# Patient Record
Sex: Female | Born: 1945 | Race: White | Hispanic: No | Marital: Married | Smoking: Never smoker
Health system: Southern US, Community
[De-identification: ages and names within clinical notes are randomized; demographics above are authoritative.]

## PROBLEM LIST (undated history)

## (undated) DIAGNOSIS — R911 Solitary pulmonary nodule: Secondary | ICD-10-CM

## (undated) DIAGNOSIS — IMO0001 Reserved for inherently not codable concepts without codable children: Secondary | ICD-10-CM

## (undated) DIAGNOSIS — E785 Hyperlipidemia, unspecified: Secondary | ICD-10-CM

## (undated) DIAGNOSIS — R519 Headache, unspecified: Secondary | ICD-10-CM

## (undated) DIAGNOSIS — E041 Nontoxic single thyroid nodule: Secondary | ICD-10-CM

## (undated) DIAGNOSIS — K219 Gastro-esophageal reflux disease without esophagitis: Secondary | ICD-10-CM

## (undated) DIAGNOSIS — K922 Gastrointestinal hemorrhage, unspecified: Secondary | ICD-10-CM

## (undated) DIAGNOSIS — D1720 Benign lipomatous neoplasm of skin and subcutaneous tissue of unspecified limb: Secondary | ICD-10-CM

## (undated) DIAGNOSIS — C3491 Malignant neoplasm of unspecified part of right bronchus or lung: Secondary | ICD-10-CM

## (undated) DIAGNOSIS — Z9889 Other specified postprocedural states: Secondary | ICD-10-CM

## (undated) DIAGNOSIS — R1030 Lower abdominal pain, unspecified: Secondary | ICD-10-CM

## (undated) DIAGNOSIS — C73 Malignant neoplasm of thyroid gland: Secondary | ICD-10-CM

## (undated) DIAGNOSIS — R112 Nausea with vomiting, unspecified: Secondary | ICD-10-CM

## (undated) HISTORY — PX: CATARACT EXTRACTION, BILATERAL: SHX1313

## (undated) HISTORY — PX: EYE SURGERY: SHX253

## (undated) HISTORY — PX: ESOPHAGOGASTRODUODENOSCOPY: SHX1529

## (undated) HISTORY — PX: BREAST SURGERY: SHX581

## (undated) HISTORY — DX: Lower abdominal pain, unspecified: R10.30

## (undated) HISTORY — DX: Benign lipomatous neoplasm of skin and subcutaneous tissue of unspecified limb: D17.20

## (undated) HISTORY — DX: Hyperlipidemia, unspecified: E78.5

## (undated) HISTORY — DX: Reserved for inherently not codable concepts without codable children: IMO0001

## (undated) HISTORY — DX: Gastro-esophageal reflux disease without esophagitis: K21.9

## (undated) HISTORY — DX: Gastrointestinal hemorrhage, unspecified: K92.2

## (undated) HISTORY — PX: OOPHORECTOMY: SHX86

## (undated) HISTORY — PX: ABDOMINAL HYSTERECTOMY: SHX81

---

## 1974-10-31 HISTORY — PX: HERNIA REPAIR: SHX51

## 1975-11-01 HISTORY — PX: BREAST EXCISIONAL BIOPSY: SUR124

## 1975-11-01 HISTORY — PX: BREAST MASS EXCISION: SHX1267

## 1976-10-31 HISTORY — PX: CARPAL TUNNEL RELEASE: SHX101

## 1999-03-25 ENCOUNTER — Other Ambulatory Visit: Admission: RE | Admit: 1999-03-25 | Discharge: 1999-03-25 | Payer: Self-pay | Admitting: *Deleted

## 1999-07-20 ENCOUNTER — Other Ambulatory Visit: Admission: RE | Admit: 1999-07-20 | Discharge: 1999-07-20 | Payer: Self-pay | Admitting: *Deleted

## 1999-11-01 HISTORY — PX: DILATION AND CURETTAGE OF UTERUS: SHX78

## 1999-12-17 ENCOUNTER — Encounter (INDEPENDENT_AMBULATORY_CARE_PROVIDER_SITE_OTHER): Payer: Self-pay

## 1999-12-17 ENCOUNTER — Other Ambulatory Visit: Admission: RE | Admit: 1999-12-17 | Discharge: 1999-12-17 | Payer: Self-pay | Admitting: *Deleted

## 2000-01-06 ENCOUNTER — Encounter: Payer: Self-pay | Admitting: *Deleted

## 2000-01-06 ENCOUNTER — Encounter: Admission: RE | Admit: 2000-01-06 | Discharge: 2000-01-06 | Payer: Self-pay | Admitting: *Deleted

## 2000-06-15 ENCOUNTER — Encounter: Payer: Self-pay | Admitting: *Deleted

## 2000-06-15 ENCOUNTER — Encounter: Admission: RE | Admit: 2000-06-15 | Discharge: 2000-06-15 | Payer: Self-pay | Admitting: *Deleted

## 2000-08-28 ENCOUNTER — Encounter: Payer: Self-pay | Admitting: Family Medicine

## 2000-08-28 ENCOUNTER — Ambulatory Visit (HOSPITAL_COMMUNITY): Admission: RE | Admit: 2000-08-28 | Discharge: 2000-08-28 | Payer: Self-pay | Admitting: Family Medicine

## 2000-09-19 ENCOUNTER — Other Ambulatory Visit: Admission: RE | Admit: 2000-09-19 | Discharge: 2000-09-19 | Payer: Self-pay | Admitting: Family Medicine

## 2001-05-12 ENCOUNTER — Encounter: Payer: Self-pay | Admitting: Emergency Medicine

## 2001-05-12 ENCOUNTER — Observation Stay (HOSPITAL_COMMUNITY): Admission: EM | Admit: 2001-05-12 | Discharge: 2001-05-13 | Payer: Self-pay | Admitting: Emergency Medicine

## 2001-06-25 ENCOUNTER — Encounter: Admission: RE | Admit: 2001-06-25 | Discharge: 2001-06-25 | Payer: Self-pay | Admitting: Family Medicine

## 2001-06-25 ENCOUNTER — Encounter: Payer: Self-pay | Admitting: Family Medicine

## 2001-10-03 ENCOUNTER — Other Ambulatory Visit: Admission: RE | Admit: 2001-10-03 | Discharge: 2001-10-03 | Payer: Self-pay | Admitting: Family Medicine

## 2001-10-10 ENCOUNTER — Encounter: Admission: RE | Admit: 2001-10-10 | Discharge: 2001-10-10 | Payer: Self-pay | Admitting: Family Medicine

## 2001-10-10 ENCOUNTER — Encounter: Payer: Self-pay | Admitting: Family Medicine

## 2002-06-26 ENCOUNTER — Encounter: Admission: RE | Admit: 2002-06-26 | Discharge: 2002-06-26 | Payer: Self-pay | Admitting: Family Medicine

## 2002-06-26 ENCOUNTER — Encounter: Payer: Self-pay | Admitting: Family Medicine

## 2002-11-28 ENCOUNTER — Other Ambulatory Visit: Admission: RE | Admit: 2002-11-28 | Discharge: 2002-11-28 | Payer: Self-pay | Admitting: Family Medicine

## 2003-07-14 ENCOUNTER — Encounter: Payer: Self-pay | Admitting: *Deleted

## 2003-07-14 ENCOUNTER — Encounter: Admission: RE | Admit: 2003-07-14 | Discharge: 2003-07-14 | Payer: Self-pay | Admitting: *Deleted

## 2003-12-03 ENCOUNTER — Other Ambulatory Visit: Admission: RE | Admit: 2003-12-03 | Discharge: 2003-12-03 | Payer: Self-pay | Admitting: Family Medicine

## 2004-07-27 ENCOUNTER — Encounter: Admission: RE | Admit: 2004-07-27 | Discharge: 2004-07-27 | Payer: Self-pay | Admitting: Family Medicine

## 2004-11-29 ENCOUNTER — Ambulatory Visit: Payer: Self-pay | Admitting: Family Medicine

## 2004-12-01 ENCOUNTER — Ambulatory Visit: Payer: Self-pay | Admitting: Family Medicine

## 2004-12-01 ENCOUNTER — Other Ambulatory Visit: Admission: RE | Admit: 2004-12-01 | Discharge: 2004-12-01 | Payer: Self-pay | Admitting: Family Medicine

## 2004-12-15 ENCOUNTER — Ambulatory Visit: Payer: Self-pay | Admitting: Family Medicine

## 2005-01-10 ENCOUNTER — Ambulatory Visit: Payer: Self-pay | Admitting: Family Medicine

## 2005-02-01 ENCOUNTER — Ambulatory Visit: Payer: Self-pay | Admitting: Family Medicine

## 2005-02-22 ENCOUNTER — Ambulatory Visit: Payer: Self-pay | Admitting: Family Medicine

## 2005-04-05 ENCOUNTER — Ambulatory Visit: Payer: Self-pay | Admitting: Family Medicine

## 2005-06-02 ENCOUNTER — Ambulatory Visit: Payer: Self-pay | Admitting: Family Medicine

## 2005-06-07 ENCOUNTER — Ambulatory Visit: Payer: Self-pay | Admitting: Family Medicine

## 2005-08-09 ENCOUNTER — Encounter: Admission: RE | Admit: 2005-08-09 | Discharge: 2005-08-09 | Payer: Self-pay | Admitting: Family Medicine

## 2005-12-22 ENCOUNTER — Ambulatory Visit: Payer: Self-pay | Admitting: Family Medicine

## 2005-12-28 ENCOUNTER — Other Ambulatory Visit: Admission: RE | Admit: 2005-12-28 | Discharge: 2005-12-28 | Payer: Self-pay | Admitting: Family Medicine

## 2005-12-28 ENCOUNTER — Ambulatory Visit: Payer: Self-pay | Admitting: Family Medicine

## 2005-12-28 ENCOUNTER — Encounter (INDEPENDENT_AMBULATORY_CARE_PROVIDER_SITE_OTHER): Payer: Self-pay | Admitting: Specialist

## 2006-01-19 ENCOUNTER — Ambulatory Visit: Payer: Self-pay | Admitting: Family Medicine

## 2006-08-16 ENCOUNTER — Encounter: Admission: RE | Admit: 2006-08-16 | Discharge: 2006-08-16 | Payer: Self-pay | Admitting: Family Medicine

## 2006-09-04 ENCOUNTER — Ambulatory Visit: Payer: Self-pay | Admitting: Family Medicine

## 2006-09-27 ENCOUNTER — Ambulatory Visit: Payer: Self-pay | Admitting: Family Medicine

## 2006-10-03 ENCOUNTER — Encounter: Admission: RE | Admit: 2006-10-03 | Discharge: 2006-10-03 | Payer: Self-pay | Admitting: Family Medicine

## 2006-10-18 ENCOUNTER — Ambulatory Visit: Payer: Self-pay | Admitting: Gastroenterology

## 2006-12-18 ENCOUNTER — Ambulatory Visit (HOSPITAL_COMMUNITY): Admission: RE | Admit: 2006-12-18 | Discharge: 2006-12-18 | Payer: Self-pay | Admitting: Gastroenterology

## 2006-12-29 ENCOUNTER — Ambulatory Visit: Payer: Self-pay | Admitting: Family Medicine

## 2006-12-29 LAB — CONVERTED CEMR LAB
BUN: 11 mg/dL
CO2: 31 meq/L
Calcium: 8.9 mg/dL
Chloride: 103 meq/L
Cholesterol: 119 mg/dL
Creatinine, Ser: 0.7 mg/dL
GFR calc Af Amer: 109 mL/min
GFR calc non Af Amer: 90 mL/min
Glucose, Bld: 101 mg/dL — ABNORMAL HIGH
HDL: 34.5 mg/dL — ABNORMAL LOW
LDL Cholesterol: 67 mg/dL
Potassium: 4.1 meq/L
Sodium: 138 meq/L
TSH: 1.89 u[IU]/mL
Total CHOL/HDL Ratio: 3.4
Triglycerides: 86 mg/dL
VLDL: 17 mg/dL

## 2007-01-03 ENCOUNTER — Other Ambulatory Visit: Admission: RE | Admit: 2007-01-03 | Discharge: 2007-01-03 | Payer: Self-pay | Admitting: Family Medicine

## 2007-01-03 ENCOUNTER — Encounter: Payer: Self-pay | Admitting: Family Medicine

## 2007-01-03 ENCOUNTER — Ambulatory Visit: Payer: Self-pay | Admitting: Family Medicine

## 2007-01-23 ENCOUNTER — Ambulatory Visit: Payer: Self-pay | Admitting: Family Medicine

## 2007-07-12 ENCOUNTER — Telehealth (INDEPENDENT_AMBULATORY_CARE_PROVIDER_SITE_OTHER): Payer: Self-pay | Admitting: *Deleted

## 2007-08-14 ENCOUNTER — Ambulatory Visit: Payer: Self-pay | Admitting: Family Medicine

## 2007-08-21 ENCOUNTER — Encounter: Admission: RE | Admit: 2007-08-21 | Discharge: 2007-08-21 | Payer: Self-pay | Admitting: Family Medicine

## 2007-08-22 ENCOUNTER — Encounter (INDEPENDENT_AMBULATORY_CARE_PROVIDER_SITE_OTHER): Payer: Self-pay | Admitting: *Deleted

## 2007-10-10 ENCOUNTER — Encounter: Payer: Self-pay | Admitting: Family Medicine

## 2007-11-01 HISTORY — PX: COLONOSCOPY: SHX174

## 2008-01-31 ENCOUNTER — Ambulatory Visit: Payer: Self-pay | Admitting: Family Medicine

## 2008-01-31 DIAGNOSIS — E749 Disorder of carbohydrate metabolism, unspecified: Secondary | ICD-10-CM | POA: Insufficient documentation

## 2008-01-31 DIAGNOSIS — E785 Hyperlipidemia, unspecified: Secondary | ICD-10-CM | POA: Insufficient documentation

## 2008-01-31 LAB — CONVERTED CEMR LAB
ALT: 30 units/L (ref 0–35)
AST: 27 units/L (ref 0–37)
Albumin: 3.7 g/dL (ref 3.5–5.2)
Alkaline Phosphatase: 43 units/L (ref 39–117)
BUN: 17 mg/dL (ref 6–23)
Bilirubin, Direct: 0.1 mg/dL (ref 0.0–0.3)
CO2: 30 meq/L (ref 19–32)
Calcium: 9.2 mg/dL (ref 8.4–10.5)
Chloride: 103 meq/L (ref 96–112)
Cholesterol: 119 mg/dL (ref 0–200)
Creatinine, Ser: 0.7 mg/dL (ref 0.4–1.2)
Creatinine,U: 55.3 mg/dL
GFR calc Af Amer: 109 mL/min
GFR calc non Af Amer: 90 mL/min
Glucose, Bld: 97 mg/dL (ref 70–99)
HDL: 37.2 mg/dL — ABNORMAL LOW (ref >39.0)
LDL Cholesterol: 65 mg/dL (ref 0–99)
Microalb Creat Ratio: 9 mg/g (ref 0.0–30.0)
Microalb, Ur: 0.5 mg/dL (ref 0.0–1.9)
Potassium: 4.2 meq/L (ref 3.5–5.1)
Sodium: 141 meq/L (ref 135–145)
TSH: 1.29 microintl units/mL (ref 0.35–5.50)
Total Bilirubin: 1 mg/dL (ref 0.3–1.2)
Total CHOL/HDL Ratio: 3.2
Total Protein: 6.6 g/dL (ref 6.0–8.3)
Triglycerides: 85 mg/dL (ref 0–149)
VLDL: 17 mg/dL (ref 0–40)

## 2008-02-06 ENCOUNTER — Encounter: Payer: Self-pay | Admitting: Family Medicine

## 2008-02-06 ENCOUNTER — Other Ambulatory Visit: Admission: RE | Admit: 2008-02-06 | Discharge: 2008-02-06 | Payer: Self-pay | Admitting: Family Medicine

## 2008-02-06 ENCOUNTER — Ambulatory Visit: Payer: Self-pay | Admitting: Family Medicine

## 2008-02-07 LAB — CONVERTED CEMR LAB: Pap Smear: NORMAL

## 2008-02-12 ENCOUNTER — Encounter (INDEPENDENT_AMBULATORY_CARE_PROVIDER_SITE_OTHER): Payer: Self-pay | Admitting: *Deleted

## 2008-04-25 ENCOUNTER — Telehealth: Payer: Self-pay | Admitting: Family Medicine

## 2008-07-28 ENCOUNTER — Telehealth: Payer: Self-pay | Admitting: Family Medicine

## 2008-08-26 ENCOUNTER — Encounter: Admission: RE | Admit: 2008-08-26 | Discharge: 2008-08-26 | Payer: Self-pay | Admitting: Family Medicine

## 2008-08-27 ENCOUNTER — Encounter (INDEPENDENT_AMBULATORY_CARE_PROVIDER_SITE_OTHER): Payer: Self-pay | Admitting: *Deleted

## 2008-12-12 ENCOUNTER — Ambulatory Visit: Payer: Self-pay | Admitting: Internal Medicine

## 2008-12-12 DIAGNOSIS — R030 Elevated blood-pressure reading, without diagnosis of hypertension: Secondary | ICD-10-CM | POA: Insufficient documentation

## 2008-12-30 ENCOUNTER — Ambulatory Visit (HOSPITAL_COMMUNITY): Admission: RE | Admit: 2008-12-30 | Discharge: 2008-12-31 | Payer: Self-pay | Admitting: Obstetrics and Gynecology

## 2008-12-30 ENCOUNTER — Encounter (INDEPENDENT_AMBULATORY_CARE_PROVIDER_SITE_OTHER): Payer: Self-pay | Admitting: Obstetrics and Gynecology

## 2008-12-30 HISTORY — PX: TOTAL VAGINAL HYSTERECTOMY: SHX2548

## 2008-12-30 HISTORY — PX: ABDOMINAL HYSTERECTOMY: SHX81

## 2009-01-08 ENCOUNTER — Ambulatory Visit: Payer: Self-pay | Admitting: Family Medicine

## 2009-02-09 ENCOUNTER — Ambulatory Visit: Payer: Self-pay | Admitting: Family Medicine

## 2009-02-09 LAB — CONVERTED CEMR LAB
ALT: 27 units/L (ref 0–35)
AST: 23 units/L (ref 0–37)
Albumin: 3.9 g/dL (ref 3.5–5.2)
Alkaline Phosphatase: 43 units/L (ref 39–117)
BUN: 16 mg/dL (ref 6–23)
Basophils Absolute: 0.1 10*3/uL (ref 0.0–0.1)
Basophils Relative: 0.8 % (ref 0.0–3.0)
Bilirubin, Direct: 0 mg/dL (ref 0.0–0.3)
CO2: 32 meq/L (ref 19–32)
Calcium: 9.4 mg/dL (ref 8.4–10.5)
Chloride: 108 meq/L (ref 96–112)
Cholesterol: 143 mg/dL (ref 0–200)
Creatinine, Ser: 0.8 mg/dL (ref 0.4–1.2)
Creatinine,U: 25.9 mg/dL
Eosinophils Absolute: 0.1 10*3/uL (ref 0.0–0.7)
Eosinophils Relative: 1.2 % (ref 0.0–5.0)
GFR calc non Af Amer: 76.58 mL/min (ref >60)
Glucose, Bld: 87 mg/dL (ref 70–99)
HCT: 40.4 % (ref 36.0–46.0)
HDL: 39.5 mg/dL (ref >39.00)
Hemoglobin: 14.2 g/dL (ref 12.0–15.0)
LDL Cholesterol: 76 mg/dL (ref 0–99)
Lymphocytes Relative: 34.5 % (ref 12.0–46.0)
Lymphs Abs: 2.3 10*3/uL (ref 0.7–4.0)
MCHC: 35.2 g/dL (ref 30.0–36.0)
MCV: 89.4 fL (ref 78.0–100.0)
Microalb Creat Ratio: 7.7 mg/g (ref 0.0–30.0)
Microalb, Ur: 0.2 mg/dL (ref 0.0–1.9)
Monocytes Absolute: 0.4 10*3/uL (ref 0.1–1.0)
Monocytes Relative: 6.5 % (ref 3.0–12.0)
Neutro Abs: 3.7 10*3/uL (ref 1.4–7.7)
Neutrophils Relative %: 57 % (ref 43.0–77.0)
Platelets: 130 10*3/uL — ABNORMAL LOW (ref 150.0–400.0)
Potassium: 4.6 meq/L (ref 3.5–5.1)
RBC: 4.52 M/uL (ref 3.87–5.11)
RDW: 13.6 % (ref 11.5–14.6)
Sodium: 143 meq/L (ref 135–145)
TSH: 2.17 microintl units/mL (ref 0.35–5.50)
Total Bilirubin: 0.9 mg/dL (ref 0.3–1.2)
Total CHOL/HDL Ratio: 4
Total Protein: 6.8 g/dL (ref 6.0–8.3)
Triglycerides: 138 mg/dL (ref 0.0–149.0)
VLDL: 27.6 mg/dL (ref 0.0–40.0)
WBC: 6.6 10*3/uL (ref 4.5–10.5)

## 2009-02-11 ENCOUNTER — Ambulatory Visit: Payer: Self-pay | Admitting: Family Medicine

## 2009-03-04 LAB — FECAL OCCULT BLOOD, GUAIAC: Fecal Occult Blood: NEGATIVE

## 2009-03-05 ENCOUNTER — Ambulatory Visit: Payer: Self-pay | Admitting: Family Medicine

## 2009-03-05 LAB — CONVERTED CEMR LAB
OCCULT 1: NEGATIVE
OCCULT 2: NEGATIVE
OCCULT 3: NEGATIVE

## 2009-03-09 ENCOUNTER — Encounter (INDEPENDENT_AMBULATORY_CARE_PROVIDER_SITE_OTHER): Payer: Self-pay | Admitting: *Deleted

## 2009-06-02 ENCOUNTER — Ambulatory Visit: Payer: Self-pay | Admitting: Family Medicine

## 2009-09-03 ENCOUNTER — Encounter: Admission: RE | Admit: 2009-09-03 | Discharge: 2009-09-03 | Payer: Self-pay | Admitting: Family Medicine

## 2009-09-03 LAB — HM MAMMOGRAPHY: HM Mammogram: NORMAL

## 2010-01-13 HISTORY — PX: FACIAL COSMETIC SURGERY: SHX629

## 2010-02-11 ENCOUNTER — Ambulatory Visit: Payer: Self-pay | Admitting: Family Medicine

## 2010-02-12 LAB — CONVERTED CEMR LAB
ALT: 35 units/L (ref 0–35)
AST: 29 units/L (ref 0–37)
Albumin: 3.9 g/dL (ref 3.5–5.2)
Alkaline Phosphatase: 49 units/L (ref 39–117)
BUN: 11 mg/dL (ref 6–23)
Basophils Absolute: 0 10*3/uL (ref 0.0–0.1)
Basophils Relative: 0.6 % (ref 0.0–3.0)
Bilirubin, Direct: 0.1 mg/dL (ref 0.0–0.3)
CO2: 31 meq/L (ref 19–32)
Calcium: 9.2 mg/dL (ref 8.4–10.5)
Chloride: 106 meq/L (ref 96–112)
Cholesterol: 126 mg/dL (ref 0–200)
Creatinine, Ser: 0.8 mg/dL (ref 0.4–1.2)
Creatinine,U: 78.2 mg/dL
Eosinophils Absolute: 0.1 10*3/uL (ref 0.0–0.7)
Eosinophils Relative: 1.4 % (ref 0.0–5.0)
GFR calc non Af Amer: 76.34 mL/min (ref >60)
Glucose, Bld: 96 mg/dL (ref 70–99)
HCT: 41.3 % (ref 36.0–46.0)
HDL: 41.2 mg/dL (ref >39.00)
Hemoglobin: 14.3 g/dL (ref 12.0–15.0)
LDL Cholesterol: 68 mg/dL (ref 0–99)
Lymphocytes Relative: 32.3 % (ref 12.0–46.0)
Lymphs Abs: 1.9 10*3/uL (ref 0.7–4.0)
MCHC: 34.5 g/dL (ref 30.0–36.0)
MCV: 90.8 fL (ref 78.0–100.0)
Microalb Creat Ratio: 11.5 mg/g (ref 0.0–30.0)
Microalb, Ur: 0.9 mg/dL (ref 0.0–1.9)
Monocytes Absolute: 0.4 10*3/uL (ref 0.1–1.0)
Monocytes Relative: 7.4 % (ref 3.0–12.0)
Neutro Abs: 3.4 10*3/uL (ref 1.4–7.7)
Neutrophils Relative %: 58.3 % (ref 43.0–77.0)
Platelets: 131 10*3/uL — ABNORMAL LOW (ref 150.0–400.0)
Potassium: 4.2 meq/L (ref 3.5–5.1)
RBC: 4.55 M/uL (ref 3.87–5.11)
RDW: 15.3 % — ABNORMAL HIGH (ref 11.5–14.6)
Sodium: 143 meq/L (ref 135–145)
TSH: 1.65 microintl units/mL (ref 0.35–5.50)
Total Bilirubin: 0.7 mg/dL (ref 0.3–1.2)
Total CHOL/HDL Ratio: 3
Total Protein: 6.8 g/dL (ref 6.0–8.3)
Triglycerides: 83 mg/dL (ref 0.0–149.0)
VLDL: 16.6 mg/dL (ref 0.0–40.0)
WBC: 5.8 10*3/uL (ref 4.5–10.5)

## 2010-02-17 ENCOUNTER — Other Ambulatory Visit: Admission: RE | Admit: 2010-02-17 | Discharge: 2010-02-17 | Payer: Self-pay | Admitting: Family Medicine

## 2010-02-17 ENCOUNTER — Ambulatory Visit: Payer: Self-pay | Admitting: Family Medicine

## 2010-02-17 LAB — CONVERTED CEMR LAB: Pap Smear: NORMAL

## 2010-02-17 LAB — HM PAP SMEAR

## 2010-02-28 LAB — CONVERTED CEMR LAB: Pap Smear: NEGATIVE

## 2010-03-01 ENCOUNTER — Encounter (INDEPENDENT_AMBULATORY_CARE_PROVIDER_SITE_OTHER): Payer: Self-pay | Admitting: *Deleted

## 2010-05-05 ENCOUNTER — Encounter: Payer: Self-pay | Admitting: Family Medicine

## 2010-05-05 ENCOUNTER — Telehealth: Payer: Self-pay | Admitting: Family Medicine

## 2010-05-07 ENCOUNTER — Encounter: Payer: Self-pay | Admitting: Family Medicine

## 2010-06-03 ENCOUNTER — Encounter (INDEPENDENT_AMBULATORY_CARE_PROVIDER_SITE_OTHER): Payer: Self-pay | Admitting: *Deleted

## 2010-06-16 ENCOUNTER — Encounter (INDEPENDENT_AMBULATORY_CARE_PROVIDER_SITE_OTHER): Payer: Self-pay | Admitting: *Deleted

## 2010-07-20 ENCOUNTER — Telehealth: Payer: Self-pay | Admitting: Family Medicine

## 2010-11-12 ENCOUNTER — Other Ambulatory Visit: Payer: Self-pay | Admitting: Family Medicine

## 2010-11-12 ENCOUNTER — Ambulatory Visit
Admission: RE | Admit: 2010-11-12 | Discharge: 2010-11-12 | Payer: Self-pay | Source: Home / Self Care | Attending: Family Medicine | Admitting: Family Medicine

## 2010-11-12 LAB — HEPATIC FUNCTION PANEL
ALT: 23 U/L (ref 0–35)
AST: 24 U/L (ref 0–37)
Albumin: 3.6 g/dL (ref 3.5–5.2)
Alkaline Phosphatase: 49 U/L (ref 39–117)
Bilirubin, Direct: 0.1 mg/dL (ref 0.0–0.3)
Total Bilirubin: 0.6 mg/dL (ref 0.3–1.2)
Total Protein: 6.2 g/dL (ref 6.0–8.3)

## 2010-11-12 LAB — LIPID PANEL
Cholesterol: 160 mg/dL (ref 0–200)
HDL: 34.7 mg/dL — ABNORMAL LOW (ref >39.00)
LDL Cholesterol: 106 mg/dL — ABNORMAL HIGH (ref 0–99)
Total CHOL/HDL Ratio: 5
Triglycerides: 96 mg/dL (ref 0.0–149.0)
VLDL: 19.2 mg/dL (ref 0.0–40.0)

## 2010-11-17 ENCOUNTER — Ambulatory Visit
Admission: RE | Admit: 2010-11-17 | Discharge: 2010-11-17 | Payer: Self-pay | Source: Home / Self Care | Attending: Family Medicine | Admitting: Family Medicine

## 2010-11-17 DIAGNOSIS — I10 Essential (primary) hypertension: Secondary | ICD-10-CM | POA: Insufficient documentation

## 2010-11-21 ENCOUNTER — Encounter: Payer: Self-pay | Admitting: Gastroenterology

## 2010-12-02 NOTE — Assessment & Plan Note (Signed)
Summary: PAP SMEAR AND CPX/CLE   Vital Signs:  Patient Profile:   65 Years Old Female Height:     64 inches Weight:      127 pounds Temp:     97.1 degrees F oral Pulse rate:   72 / minute Pulse rhythm:   regular BP sitting:   140 / 70  (left arm) Cuff size:   regular  Vitals Entered By: Providence Crosby (February 06, 2008 4:00 PM)                 Chief Complaint:  check up with pap just had colonoscopy and egd.  History of Present Illness: Here for Comp Exam. saw Dr Henderson Cloud in Fall when sister had ovarian ca...he did bimanual and U/S with fibroids that were unchanged. She also ha colonoscopy for screen dur to ovarian ca in sister and had EGD for repeat from before that looked ok and doesn't need to be repeated.  Next colonoscopy in 5 years. No complaints, feels well...firbroid hurts once in a while. Another sister has cyst on ovaries.    Prior Medications Reviewed Using: Patient Recall  Current Allergies (reviewed today): ! * MEDROL DOSE PACK ! SULFA ! * TYLENOL ARTHRITIS ! CIPRO  Past Surgical History:    EGD Nml (Dr Kinnie Scales) 11/15/2007    Colonoscopy   Nml (Dr Shauna Hugh) 11/15/2007    Risk Factors:  Tobacco use:  never Passive smoke exposure:  no Drug use:  no HIV high-risk behavior:  no Caffeine use:  2 drinks per day Alcohol use:  no Exercise:  yes    Times per week:  2    Type:  treadmill  Seatbelt use:  100 %   Review of Systems  Eyes      Denies blurring, discharge, double vision, eye irritation, eye pain, halos, itching, light sensitivity, red eye, vision loss-1 eye, and vision loss-both eyes.      contacts, exam 12/08  ENT      Denies decreased hearing, difficulty swallowing, ear discharge, earache, hoarseness, nasal congestion, nosebleeds, postnasal drainage, ringing in ears, sinus pressure, and sore throat.  CV      Denies bluish discoloration of lips or nails, chest pain or discomfort, difficulty breathing at night, difficulty breathing while lying  down, fainting, fatigue, leg cramps with exertion, lightheadness, near fainting, palpitations, shortness of breath with exertion, swelling of feet, swelling of hands, and weight gain.  Resp      Denies chest discomfort, chest pain with inspiration, cough, coughing up blood, excessive snoring, hypersomnolence, morning headaches, pleuritic, shortness of breath, sputum productive, and wheezing.  GI      Complains of indigestion.      Denies abdominal pain, bloody stools, change in bowel habits, constipation, dark tarry stools, diarrhea, excessive appetite, gas, hemorrhoids, loss of appetite, nausea, vomiting, vomiting blood, and yellowish skin color.      uses Prevacid  GU      Denies abnormal vaginal bleeding, decreased libido, discharge, dysuria, genital sores, hematuria, incontinence, nocturia, urinary frequency, and urinary hesitancy.  MS      Denies joint pain, joint redness, joint swelling, loss of strength, low back pain, mid back pain, muscle aches, muscle , cramps, muscle weakness, stiffness, and thoracic pain.  Derm      Denies changes in color of skin, changes in nail beds, dryness, excessive perspiration, flushing, hair loss, insect bite(s), itching, lesion(s), poor wound healing, and rash.  Neuro      Denies brief paralysis, difficulty  with concentration, disturbances in coordination, falling down, headaches, inability to speak, memory loss, numbness, poor balance, seizures, sensation of room spinning, tingling, tremors, visual disturbances, and weakness.   Physical Exam  General:     Well-developed,well-nourished,in no acute distress; alert,appropriate and cooperative throughout examination Head:     Normocephalic and atraumatic without obvious abnormalities. No apparent alopecia or balding. Eyes:     Conjunctiva clear bilaterally.  Ears:     External ear exam shows no significant lesions or deformities.  Otoscopic examination reveals clear canals, tympanic membranes are  intact bilaterally without bulging, retraction, inflammation or discharge. Hearing is grossly normal bilaterally. Nose:     External nasal examination shows no deformity or inflammation. Nasal mucosa are pink and moist without lesions or exudates. Mouth:     Oral mucosa and oropharynx without lesions or exudates.  Teeth in good repair. Neck:     No deformities, masses, or tenderness noted. Chest Wall:     No deformities, masses, or tenderness noted. Lungs:     Normal respiratory effort, chest expands symmetrically. Lungs are clear to auscultation, no crackles or wheezes. Heart:     Normal rate and regular rhythm. S1 and S2 normal without gallop, murmur, click, rub or other extra sounds. Abdomen:     Bowel sounds positive,abdomen soft and non-tender without masses, organomegaly or hernias noted. Rectal:     No external abnormalities noted. Normal sphincter tone. No rectal masses or tenderness. G neg. Genitalia:     Pelvic Exam:        External: normal female genitalia without lesions or masses        Vagina: normal without lesions or masses        Cervix: normal without lesions or masses        Adnexa: normal bimanual exam without masses or fullness.          Uterus: normal by palpation Ovaries not felt.        Pap smear: performed Msk:     No deformity or scoliosis noted of thoracic or lumbar spine.   Pulses:     R and L carotid,radial,femoral,dorsalis pedis and posterior tibial pulses are full and equal bilaterally Extremities:     No clubbing, cyanosis, edema, or deformity noted with normal full range of motion of all joints.   Neurologic:     No cranial nerve deficits noted. Station and gait are normal. Plantar reflexes are down-going bilaterally. DTRs are symmetrical throughout. Sensory, motor and coordinative functions appear intact. Skin:     Intact without suspicious lesions or rashes Cervical Nodes:     No lymphadenopathy noted Axillary Nodes:     No palpable  lymphadenopathy Inguinal Nodes:     No significant adenopathy Psych:     Cognition and judgment appear intact. Alert and cooperative with normal attention span and concentration. No apparent delusions, illusions, hallucinations    Impression & Recommendations:  Problem # 1:  HEALTH MAINTENANCE EXAM (ICD-V70.0) Assessment: Comment Only  Problem # 2:  HYPERLIPIDEMIA (ICD-272.4) Assessment: Unchanged Great nos, try to exercise more to bring up good chol. Her updated medication list for this problem includes:    Vytorin 10-20 Mg Tabs (Ezetimibe-simvastatin) .Marland Kitchen... Take 1 tablet by mouth every night   Problem # 3:  CARBOHYDRATE METABOLISM DISORDER (ICD-271.9) Assessment: Improved Nml today. Cont watching diet.  Problem # 4:  HX, FAMILY, MALIGNANCY, OVARY (ICD-V16.41) Assessment: Unchanged Sister is responding to trmt. Pts ovaries feel nml today.  Complete Medication List: 1)  Fish Oil 1000 Mg Caps (Omega-3 fatty acids) .... Take 2 capsule by mouth twice a day 2)  Calcium 500 Mg Tabs (Calcium) .... Take 1/2 once a day 3)  Prevacid 30 Mg Cpdr (Lansoprazole) .... Take 1 capsule by mouth once a day 4)  Vytorin 10-20 Mg Tabs (Ezetimibe-simvastatin) .... Take 1 tablet by mouth every night 5)  Vision Herb  .Marland Kitchen.. 1 qd 6)  Coq 10  .Marland Kitchen.. 1 qd 7)  Vitamin C  .... Takes prn 8)  Vitamin E 400 Iu  .Marland Kitchen.. 1 qd 9)  Tylenol/codeine #3 300-30 Mg Tabs (Acetaminophen-codeine) .... One tab by mouth once daily for fibroid pain   Patient Instructions: 1)  RTC one year.    Prescriptions: TYLENOL/CODEINE #3 300-30 MG  TABS (ACETAMINOPHEN-CODEINE) one tab by mouth once daily for fibroid pain  #30 x 0   Entered and Authorized by:   Shaune Leeks MD   Signed by:   Shaune Leeks MD on 02/06/2008   Method used:   Print then Give to Patient   RxID:   [id number] VYTORIN 10-20 MG TABS (EZETIMIBE-SIMVASTATIN) Take 1 tablet by mouth every night  #30 x 11   Entered by:   Providence Crosby    Authorized by:   Shaune Leeks MD   Signed by:   Providence Crosby on 02/06/2008   Method used:   Electronically sent to ...       [facility]  [place] #[id number]       [address]       [place], [place]  [address]       Ph: [phone]       Fax: [phone]   RxID:   [id number] PREVACID 30 MG CPDR (LANSOPRAZOLE) Take 1 capsule by mouth once a day  #30 x 11   Entered by:   Providence Crosby   Authorized by:   Shaune Leeks MD   Signed by:   Providence Crosby on 02/06/2008   Method used:   Electronically sent to ...       [facility]  [id number]       [address]       [place], [place]  [address]       Ph: [phone]       Fax: [phone]   RxID:   [id number]  ]

## 2010-12-02 NOTE — Procedures (Signed)
Summary: [facility]enter[facility]/Dr. [facility]/Dr. Kinnie Scales   Imported By: Mickle Asper 10/26/2007 12:30:17  _____________________________________________________________________  External Attachment:    Type:   Image     Comment:   External Document

## 2010-12-02 NOTE — Letter (Signed)
Summary: Results Follow up Letter  [place] [facility]  [address] [place], [place] [address]   Phone: [phone]  Fax: [phone]    02/12/2008 MRN: [id number]  [facility] [address] [place], [place]  [address]  Dear Ms. Mccranie,  The following are the results of your recent test(s):  Test         Result    Pap Smear:        Normal __X___  Not Normal _____ Comments:  YOUR PAP SMEAR REPORT WAS NORMAL. PLEASE MAKE SURE TO REPEAT IN ONE YEAR. ENCLOSED IS A COPY OF YOUR REPORT. ______________________________________________________ Cholesterol: LDL(Bad cholesterol):         Your goal is less than:         HDL (Good cholesterol):       Your goal is more than: Comments:  ______________________________________________________ Mammogram:        Normal _____  Not Normal _____ Comments:  ___________________________________________________________________ Hemoccult:        Normal _____  Not normal _______ Comments:    _____________________________________________________________________ Other Tests:    We routinely do not discuss normal results over the telephone.  If you desire a copy of the results, or you have any questions about this information we can discuss them at your next office visit.   Sincerely,

## 2010-12-02 NOTE — Miscellaneous (Signed)
   Clinical Lists Changes  Observations: Added new observation of ZOSTAVAX: Zostavax (05/07/2010 15:40)      Immunization History:  Zostavax History:    Zostavax # 1:  Zostavax (05/07/2010)

## 2010-12-02 NOTE — Assessment & Plan Note (Signed)
Summary: BP CHECK / LFW   Vital Signs:  Patient profile:   65 year old female Height:      64 inches Weight:      127 pounds BMI:     21.88 Temp:     97.4 degrees F oral Pulse rate:   80 / minute Pulse rhythm:   regular BP sitting:   130 / 80  (left arm) Cuff size:   regular  Vitals Entered ByProvidence Crosby (January 08, 2009 12:20 PM)  History of Present Illness: Pt here for recheck of HBP. She had a previous episode of high BP with lightheadedness prior to being seen last time. BP was 170/80 sitting, 140/84 standing, during office visit on 12/12/2008. Family hx HTN. (every fam mbr but her) Over last month, has checked BP regularly, with average  ~125/80.  Only  value of 180/96 was taken during pre-operative exam, before her hysterectomy.  BP today was 130/80.   She had TVH on 12/30/2008 for fibroids.   Allergies: 1)  ! * Medrol Dose Pack 2)  ! Sulfa 3)  ! * Tylenol Arthritis 4)  ! Cipro  Past Surgical History:    EGD Nml (Dr Kinnie Scales) 11/15/2007    Colonoscopy   Nml (Dr Shauna Hugh) 11/15/2007    TVH for fibroid pain (Dr Huntley Dec) 12/30/2008  Physical Exam  General:  Well-developed,well-nourished,in no acute distress; alert,appropriate and cooperative throughout examination Head:  Normocephalic and atraumatic without obvious abnormalities. No apparent alopecia or balding. Eyes:  Conjunctiva clear bilaterally.  Ears:  External ear exam shows no significant lesions or deformities.  Otoscopic examination reveals clear canals, tympanic membranes are intact bilaterally without bulging, retraction, inflammation or discharge. Hearing is grossly normal bilaterally. Nose:  External nasal examination shows no deformity or inflammation. Nasal mucosa are pink and moist without lesions or exudates. Mouth:  Oral mucosa and oropharynx without lesions or exudates.  Teeth in good repair. Neck:  No deformities, masses, or tenderness noted. Lungs:  Normal respiratory effort, chest expands symmetrically.  Lungs are clear to auscultation, no crackles or wheezes. Heart:  Normal rate and regular rhythm. S1 and S2 normal without gallop, murmur, click, rub or other extra sounds.   Impression & Recommendations:  Problem # 1:  ELEVATED BLOOD PRESSURE WITHOUT DIAGNOSIS OF HYPERTENSION (ICD-796.2) Assessment Improved  Nos nml, no further sxs. Will follow. Feel she'll need medication just based on FH, but not yet.  BP today: 130/80 Prior BP: 140/74 (12/12/2008)  Labs Reviewed: Creat: 0.7 (01/31/2008) Chol: 119 (01/31/2008)   HDL: 37.2 (01/31/2008)   LDL: 65 (01/31/2008)   TG: 85 (01/31/2008)  Instructed in low sodium diet (DASH Handout) and behavior modification.    Complete Medication List: 1)  Fish Oil 1000 Mg Caps (Omega-3 fatty acids) .... Take 2 capsule by mouth twice a day 2)  Calcium 500 Mg Tabs (Calcium) .... Take 1/2 once a day 3)  Prevacid 30 Mg Cpdr (Lansoprazole) .... Take 1 capsule by mouth once a day 4)  Vytorin 10-20 Mg Tabs (Ezetimibe-simvastatin) .... Take 1 tablet by mouth every night 5)  Vision Herb  .Marland Kitchen.. 1 daily by mouth 6)  Vitamin D3 10000 Unit Caps (Cholecalciferol) .Marland Kitchen.. 1 daily by mouth 7)  Vitamin E-400 400 Unit Caps (Vitamin e) .Marland Kitchen.. 1 daily by mouth 8)  Vitamin C 500 Mg Chew (Ascorbic acid) .... As needed by mouth 9)  Coq10 50 Mg Caps (Coenzyme q10) .... Daily by mouth 10)  Percocet 5-325 Mg Tabs (Oxycodone-acetaminophen) .... As needed  post op pain

## 2010-12-02 NOTE — Letter (Signed)
Summary: Results Follow up Letter  [place] at [facility]  [address] [place], [place] [address]   Phone: [phone]  Fax: [phone]    03/01/2010 MRN: [id number]  [facility] [address] [place], [place]  [address]  Dear Ms. Muratalla,  The following are the results of your recent test(s):  Test         Result    Pap Smear:        Normal __x___  Not Normal _____ Comments: Please repeat in one year. ______________________________________________________ Cholesterol: LDL(Bad cholesterol):         Your goal is less than:         HDL (Good cholesterol):       Your goal is more than: Comments:  ______________________________________________________ Mammogram:        Normal _____  Not Normal _____ Comments:  ___________________________________________________________________ Hemoccult:        Normal _____  Not normal _______ Comments:    _____________________________________________________________________ Other Tests:    We routinely do not discuss normal results over the telephone.  If you desire a copy of the results, or you have any questions about this information we can discuss them at your next office visit.   Sincerely,     Laurita Quint, MD

## 2010-12-02 NOTE — Assessment & Plan Note (Signed)
Summary: CPX   Vital Signs:  Patient profile:   65 year old female Weight:      134 pounds Temp:     98.4 degrees F oral Pulse rate:   88 / minute Pulse rhythm:   regular BP sitting:   140 / 80  (left arm) Cuff size:   regular  Vitals Entered By: Sydell Axon LPN (February 17, 2010 2:57 PM) CC: 30 Minute checkup, hysterectomy 03/10 by Dr. Henderson Cloud and had a colonoscopy 2009 by Dr. Kinnie Scales   History of Present Illness: Pt here for Comp Exam. She feels well with no complaints. She had facial surgery in Mar. She has TVH last year for fibroids, ovaries removed due to sister's ovarian cancer.  Preventive Screening-Counseling & Management  Alcohol-Tobacco     Alcohol drinks/day: 0     Smoking Status: never     Passive Smoke Exposure: no  Caffeine-Diet-Exercise     Caffeine use/day: 1     Does Patient Exercise: no     Type of exercise: treadmill      Times/week: 2  Problems Prior to Update: 1)  Health Maintenance Exam  (ICD-V70.0) 2)  Special Screening Malig Neoplasms Other Sites  (ICD-V76.49) 3)  Elevated Blood Pressure Without Diagnosis of Hypertension  (ICD-796.2) 4)  Hyperlipidemia  (ICD-272.4) 5)  Carbohydrate Metabolism Disorder  (ICD-271.9) 6)  Hx, Family, Malignancy, Ovary  (ICD-V16.41)  Medications Prior to Update: 1)  Fish Oil 1000 Mg Caps (Omega-3 Fatty Acids) .... Take 2 Capsule By Mouth Twice A Day 2)  Calcium 500 Mg Tabs (Calcium) .... Take 1  Once A Day 3)  Prevacid 30 Mg Cpdr (Lansoprazole) .... Take 1 Capsule By Mouth Once A Day 4)  Vytorin 10-20 Mg Tabs (Ezetimibe-Simvastatin) .... Take 1 Tablet By Mouth Every Night 5)  Vision Herb .Marland Kitchen.. 1 Daily By Mouth 6)  Vitamin D3 10000 Unit Caps (Cholecalciferol) .Marland Kitchen.. 1 Daily By Mouth 7)  Vitamin E-400 400 Unit Caps (Vitamin E) .Marland Kitchen.. 1 Daily By Mouth 8)  Coq10 50 Mg Caps (Coenzyme Q10) .... Daily By Mouth 9)  Vitamin C 500 Mg .Marland KitchenMarland Kitchen. 1 Daily By Mouth 10)  Vitamin B6/vitamin B12 and Folic Acid .... Sublinqual  Daily  Allergies: 1)  ! * Medrol Dose Pack 2)  ! Sulfa 3)  ! * Tylenol Arthritis 4)  ! 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  Risk Factors: Alcohol Use: 0 (02/17/2010) Caffeine Use: 1 (02/17/2010) Exercise: no (02/17/2010)  Risk Factors: Smoking Status: never (02/17/2010) Passive Smoke Exposure: no (02/17/2010)  Past Surgical History: EGD Nml (Dr Kinnie Scales) 11/15/2007 Colonoscopy   Nml (Dr Shauna Hugh) 11/15/2007 TVH for fibroid pain (Dr Huntley Dec) 12/30/2008 Mini Facelift 01/13/2010  Family History: Father dec 91 natural causesw Mother dec 82 Mini strokes Htn Sister A 8 Htn  Sister A 68 Htn Ovarian Ca Chemo Sister A 23 Htn PAT Sister A 60 Htn  Social History: Caffeine use/day:  1 Does Patient Exercise:  no  Review of Systems General:  Denies chills, fatigue, fever, sweats, weakness, and weight loss. Eyes:  Denies blurring, discharge, and eye pain; contacts and reading glasses. ENT:  Denies decreased hearing, ear discharge, earache, and ringing in ears. CV:  Denies chest pain or discomfort,  fainting, fatigue, palpitations, shortness of breath with exertion, swelling of feet, and swelling of hands. Resp:  Denies cough, shortness of breath, and wheezing. GI:  Complains of indigestion; denies abdominal pain, bloody stools, change in bowel habits, constipation, dark tarry stools, diarrhea, loss of appetite, nausea, vomiting, vomiting blood, and yellowish skin color; takes medication. GU:  Denies decreased libido, discharge, dysuria, nocturia, and urinary frequency. MS:  Denies joint pain, low back pain, muscle aches, cramps, muscle weakness, and stiffness. Derm:  Denies dryness, itching, and rash. Neuro:  Denies  numbness, poor balance, tingling, and tremors.  Physical Exam  General:  Well-developed,well-nourished,in no acute distress; alert,appropriate and cooperative throughout examination Head:  Normocephalic and atraumatic without obvious abnormalities. No apparent alopecia or balding. Sinuses NT. Eyes:  Conjunctiva clear bilaterally.  Ears:  External ear exam shows no significant lesions or deformities.  Otoscopic examination reveals clear canals, tympanic membranes are intact bilaterally without bulging, retraction, inflammation or discharge. Hearing is grossly normal bilaterally. Nose:  External nasal examination shows no deformity or inflammation. Nasal mucosa are pink and moist without lesions or exudates. Mouth:  Oral mucosa and oropharynx without lesions or exudates.  Teeth in good repair.  Jaw nontender and no swelling noticed. Neck:  No deformities, masses, or tenderness noted. Chest Wall:  No deformities, masses, or tenderness noted. Breasts:  No mass, nodules, thickening, tenderness, bulging, retraction, inflamation, nipple discharge or skin changes noted.  Two small soft mobile masses 3mm diam felt in upper inner quadrants  of bilat breasts. Lungs:  Normal respiratory effort, chest expands symmetrically. Lungs are clear to auscultation, no crackles or wheezes. Heart:  Normal rate and regular rhythm. S1 and S2 normal without gallop, murmur, click, rub or other extra sounds. Abdomen:  Bowel sounds positive,abdomen soft and non-tender without masses, organomegaly or hernias noted. Rectal:  No external abnormalities noted. Normal sphincter tone. No rectal masses or tenderness. G neg. Genitalia:  Introitus wnl, Uterus and Cervix absent, cuff looks healthy, adnexa nontender w/o mass, ovaries not felt. Pap of cuff sent. Msk:  No deformity or scoliosis noted of thoracic or lumbar spine.   Pulses:  R and L carotid,radial,femoral,dorsalis pedis and posterior tibial pulses are full and equal  bilaterally Extremities:  No clubbing, cyanosis, edema, or deformity noted with normal full range of motion of all joints.   Neurologic:  No cranial nerve deficits noted. Station and gait are normal. Sensory, motor and coordinative functions appear intact. Skin:  Intact without suspicious lesions or rashes. Well healing facelift scars ant to ears bilat. Cervical Nodes:  No lymphadenopathy noted Axillary Nodes:  No palpable lymphadenopathy Inguinal Nodes:  No significant adenopathy Psych:  Cognition and judgment appear intact. Alert and cooperative with normal attention span and concentration. No apparent delusions, illusions, hallucinations   Impression & Recommendations:  Problem # 1:  HEALTH MAINTENANCE EXAM (ICD-V70.0) Discussed immun, healthy lifestyle and meds. She will get Zostavax. Pneumonia shot today.  Problem # 2:  ELEVATED BLOOD PRESSURE WITHOUT DIAGNOSIS OF HYPERTENSION (ICD-796.2) She will check BP regularly at work. If >135/85, needs trmt. She will come in for trmt. BP today: 140/80 Prior BP: 120/80 (06/02/2009)  Labs Reviewed: Creat: 0.8 (02/11/2010) Chol: 126 (02/11/2010)   HDL: 41.20 (02/11/2010)   LDL: 68 (02/11/2010)   TG: 83.0 (02/11/2010)  Instructed in low sodium diet (DASH Handout) and behavior modification.    Problem # 3:  HYPERLIPIDEMIA (ICD-272.4) Assessment: Unchanged Excellent on curr med. Her updated medication list for this problem includes:    Vytorin  10-20 Mg Tabs (Ezetimibe-simvastatin) .Marland Kitchen... Take 1 tablet by mouth every night  Labs Reviewed: SGOT: 29 (02/11/2010)   SGPT: 35 (02/11/2010)   HDL:41.20 (02/11/2010), 39.50 (02/09/2009)  LDL:68 (02/11/2010), 76 (02/09/2009)  Chol:126 (02/11/2010), 143 (02/09/2009)  Trig:83.0 (02/11/2010), 138.0 (02/09/2009)  Problem # 4:  CARBOHYDRATE METABOLISM DISORDER (ICD-271.9) Assessment: Improved Euglycemic today.  Problem # 5:  HX, FAMILY, MALIGNANCY, OVARY (ICD-V16.41) Assessment: Unchanged Bimanual  today feels nml.  Complete Medication List: 1)  Fish Oil 1000 Mg Caps (Omega-3 fatty acids) .... Take 2 capsule by mouth twice a day 2)  Calcium 500 Mg Tabs (Calcium) .... Take 1  once a day 3)  Prevacid 30 Mg Cpdr (Lansoprazole) .... Take 1 capsule by mouth once a day 4)  Vytorin 10-20 Mg Tabs (Ezetimibe-simvastatin) .... Take 1 tablet by mouth every night 5)  Vision Herb  .Marland Kitchen.. 1 daily by mouth 6)  Vitamin D3 10000 Unit Caps (Cholecalciferol) .Marland Kitchen.. 1 daily by mouth 7)  Vitamin E-400 400 Unit Caps (Vitamin e) .Marland Kitchen.. 1 daily by mouth 8)  Coq10 50 Mg Caps (Coenzyme q10) .... Daily by mouth 9)  Vitamin C 500 Mg  .Marland Kitchen.. 1 daily by mouth 10)  Vitamin B6/vitamin B12 and Folic Acid  .... Sublinqual daily  Other Orders: Pneumococcal Vaccine (16109) Admin 1st Vaccine (60454)  Patient Instructions: 1)  Pneumonia shot today. Zostavax at drug store. 2)  RTC 6 mos.  Current Allergies (reviewed today): ! * MEDROL DOSE PACK ! SULFA ! * TYLENOL ARTHRITIS ! CIPRO   Pneumovax Vaccine    Vaccine Type: Pneumovax    Site: left deltoid    Mfr: Merck    Dose: 0.5 ml    Route: IM    Given by: Sydell Axon LPN    Exp. Date: 08/25/2011    Lot #: [id number]    VIS given: 05/28/96 version given February 17, 2010.

## 2010-12-02 NOTE — Letter (Signed)
Summary: Results Follow up Letter  [place] [facility]  [address] [place], [place] [address]   Phone: [phone]  Fax: [phone]    08/22/2007 MRN: [id number]  [facility] [address] [place], [place]  [address]  Dear [name],  The following are the results of your recent test(s):  Test         Result    Pap Smear:        Normal _____  Not Normal _____ Comments: ______________________________________________________ Cholesterol: LDL(Bad cholesterol):         Your goal is less than:         HDL (Good cholesterol):       Your goal is more than: Comments:  ______________________________________________________ Mammogram:        Normal _X____  Not Normal _____ Comments:YOUR MAMMOGRAM REPORT WAS NORMAL. PLEASE MAKE SURE TO REPEAT IN ONE YEAR.  ___________________________________________________________________ Hemoccult:        Normal _____  Not normal _______ Comments:    _____________________________________________________________________ Other Tests:    We routinely do not discuss normal results over the telephone.  If you desire a copy of the results, or you have any questions about this information we can discuss them at your next office visit.   Sincerely,

## 2010-12-02 NOTE — Letter (Signed)
Summary: [name] letter  [name] at [facility]  [address] [name], [name]   Phone: [phone]  Fax: [phone]       06/03/2010 MRN: [id number]  [name] [address] [name], [place]  [address]  Dear [name] [facility] [place], and [place] announce the retirement of Arta Silence, M.D., from full-time practice at the [facility] office effective April 29, 2010 and his plans of returning part-time.  It is important to [name] and to our practice that you understand that [facility] has seven physicians in our office for your health care needs.  We will continue to offer the same exceptional care that you have today.    [name] has spoken to many of you about his plans for retirement and returning part-time in the fall.   We will continue to work with you through the transition to schedule appointments for you in the office and meet the high standards that [place] is committed to.   Again, it is with great pleasure that we share the news that [name] will return to [facility] at [facility] in October of 2011 with a reduced schedule.    If you have any questions, or would like to request an appointment with one of our physicians, please call us at [phone] and press the option for Scheduling an appointment.  We take pleasure in providing you with excellent patient care and look forward to seeing you at your next office visit.  Our [facility] Physicians are:  Tillman Abide, M.D. Laurita Quint, M.D. Roxy Manns, M.D. Kerby Nora, M.D. Hannah Beat, M.D. Ruthe Hammond, M.D. We proudly welcomed Raechel Ache, M.D. and Eustaquio Boyden, M.D. to the practice in July/August 2011.  Sincerely,   [facility]

## 2010-12-02 NOTE — Progress Notes (Signed)
Summary: prior Berkley Harvey is needed for vytorin  Phone Note Other Incoming   Caller: [employer] Summary of Call: Pt received letter statin vytorin is not covered by her insurance without a prior auth.  Prior Berkley Harvey form is on your desk. Initial call taken by: Lowella Petties CMA,  July 20, 2010 9:41 AM  Follow-up for Phone Call        I called the patient.  She can't affort the vytorin, even if PA is done.  She'll finish her current supply of vytorin 10/20 and then start simvastatin 20mg  a day.  She'll call back to get fasting lab appointment for 6weeks after the med change.  she'll need hepatic panel and lipid panel.  dx 272.0.  Please mail her the rx for the simva.  She knows not to use it until out of vytorin.  We can adust med dose after the lipid panel if needed.  I wrote on the PA form that she wouldn't use vytorin anymore.  Follow-up by: Crawford Givens MD,  July 20, 2010 3:04 PM  Additional Follow-up for Phone Call Additional follow up Details #1::        Mailed.  PA form faxed. Additional Follow-up by: Delilah Shan CMA Hallis Meditz Dull),  July 20, 2010 3:16 PM   New Allergies: ! LIPITOR New/Updated Medications: ZOCOR 20 MG TABS (SIMVASTATIN) 1 by mouth at bedtime New Allergies: ! LIPITORPrescriptions: ZOCOR 20 MG TABS (SIMVASTATIN) 1 by mouth at bedtime  #90 x 3   Entered and Authorized by:   Crawford Givens MD   Signed by:   Crawford Givens MD on 07/20/2010   Method used:   Print then Give to Patient   RxID:   [id number]

## 2010-12-02 NOTE — Progress Notes (Signed)
Summary: prior auth   Phone Note Call from Patient Call back at Work Phone [phone]   Caller: Patient Call For: schaller Summary of Call: prior auth for prevacid, pt has taken nexium in past ..................................................................Marland KitchenLiane Comber  July 12, 2007 9:31 AM   (form is in inbox) Initial call taken by: Liane Comber,  July 12, 2007 9:31 AM  Follow-up for Phone Call        signed  Follow-up by: Shaune Leeks MD,  July 12, 2007 1:33 PM  Additional Follow-up for Phone Call Additional follow up Details #1::        faxed by gail ..................................................................Marland KitchenLiane Comber  July 12, 2007 1:55 PM

## 2010-12-02 NOTE — Assessment & Plan Note (Signed)
Summary: SISTER DX WITH CANCER WANTS TO KNOW IF SHE NEEDS ANY TEST/HEA  Medications Added FISH OIL 1000 MG CAPS (OMEGA-3 FATTY ACIDS) Take 2 capsule by mouth twice a day CALCIUM 500 MG TABS (CALCIUM) Take 1/2 once a day PREVACID 30 MG CPDR (LANSOPRAZOLE) Take 1 capsule by mouth once a day VYTORIN 10-20 MG TABS (EZETIMIBE-SIMVASTATIN) Take 1 tablet by mouth every night * VISION HERB 1 qd * COQ 10 1 qd * VITAMIN C takes prn * VITAMIN E 400 IU 1 qd      Allergies Added: ! * MEDROL DOSE PACK ! SULFA ! * TYLENOL ARTHRITIS ! CIPRO  Vital Signs:  Patient Profile:   65 Years Old Female Weight:      128 pounds Temp:     98.3 degrees F oral Pulse rate:   88 / minute Pulse rhythm:   regular BP sitting:   154 / 70  (left arm) Cuff size:   regular  Vitals Entered By: Providence Crosby (August 14, 2007 12:27 PM)                 Chief Complaint:  discuss sister // diagnosed with cancer.  History of Present Illness: 64 yo sister diagnosed with ovarian cancer via CT scan through ER at [facility] and had ovarian cancer with tumor removed and fluid in pelvis. Opened her and did debulking procedure. Will follow that with chemo and probable radiation with most likely further definitive surgery. Pt has known fibroids with occas pain. Last pelvic exam/pap smear was done 3/08 by me and was felt to be normal. She is having no unusual sxs and feels well but is very scared based on the stage of her sister's disease.  Current Allergies (reviewed today): ! * MEDROL DOSE PACK ! SULFA ! * TYLENOL ARTHRITIS ! CIPRO        Impression & Recommendations:  Problem # 1:  HX, FAMILY, MALIGNANCY, OVARY (ICD-V16.41) Assessment: New For peace of mind, suggest she see Gynecologist in [place] for eval and screening. We discussed Ovasure test.   Complete Medication List: 1)  Fish Oil 1000 Mg Caps (Omega-3 fatty acids) .... Take 2 capsule by mouth twice a day 2)  Calcium 500 Mg Tabs (Calcium) .... Take  1/2 once a day 3)  Prevacid 30 Mg Cpdr (Lansoprazole) .... Take 1 capsule by mouth once a day 4)  Vytorin 10-20 Mg Tabs (Ezetimibe-simvastatin) .... Take 1 tablet by mouth every night 5)  Vision Herb  .Marland Kitchen.. 1 qd 6)  Coq 10  .Marland Kitchen.. 1 qd 7)  Vitamin C  .... Takes prn 8)  Vitamin E 400 Iu  .Marland Kitchen.. 1 qd   Patient Instructions: 1)  RTC as needed, 3/09 for routine physical.    ]

## 2010-12-02 NOTE — Assessment & Plan Note (Signed)
Summary: PAP SMEAR AND CPX/CLE   Vital Signs:  Patient profile:   65 year old female Height:      64 inches (162.56 cm) Weight:      129 pounds (58.64 kg) BMI:     22.22 Temp:     97.5 degrees F (36.39 degrees C) oral Pulse rate:   68 / minute Pulse rhythm:   regular BP sitting:   140 / 70  (right arm) Cuff size:   regular  Vitals Entered By: Providence Crosby (February 11, 2009 2:48 PM) CC: check up with out pap had hysterectom march 3// hemoccult cards to patent   History of Present Illness: Pt here for Comp Exam, feels well and has no complaints. Occas has BP elevation at times. Had TVH and  BSO last month. Surgery went well. Waas in hosp overnight, BP was fine. Path was benign.   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 Take 2 Capsule By Mouth Twice A Day 2)  Calcium 500 Mg Tabs (Calcium) .... Take 1/2 Once A Day 3)  Prevacid 30 Mg Cpdr (Lansoprazole) .... Take 1 Capsule By Mouth Once A Day 4)  Vytorin 10-20 Mg Tabs (Ezetimibe-Simvastatin) .... Take 1 Tablet By Mouth Every Night 5)  Vision Herb .Marland Kitchen.. 1 Daily By Mouth 6)  Vitamin D3 10000 Unit Caps (Cholecalciferol) .Marland Kitchen.. 1 Daily By Mouth 7)  Vitamin E-400 400 Unit Caps (Vitamin E) .Marland Kitchen.. 1 Daily By Mouth 8)  Vitamin C 500 Mg Chew (Ascorbic Acid) .... As Needed By Mouth 9)  Coq10 50 Mg Caps (Coenzyme Q10) .... Daily By Mouth 10)  Percocet 5-325 Mg Tabs  (Oxycodone-Acetaminophen) .... As Needed Post Op Pain  Allergies: 1)  ! * Medrol Dose Pack 2)  ! Sulfa 3)  ! * Tylenol Arthritis 4)  ! Cipro  Family History:    Father dec 91 natural causesw    Mother dec 82 Mini strokes Htn    Sister A 77 Htn     Sister A 75 Htn Ovarian Ca Chemo    Sister A 78 Htn PAT    Sister A 23 Htn  Social History:    Occupation:Dr [employer]    Married lives with husband    Occupation:  employed  Review of Systems General:  Denies chills, fatigue, fever, loss of appetite, malaise, sleep disorder, sweats, weakness, and weight loss. Eyes:  Denies blurring, discharge, double vision, eye irritation, eye pain, halos, itching, light sensitivity, red eye, vision loss-1 eye, and vision loss-both eyes. ENT:  Denies decreased hearing, difficulty swallowing, ear discharge, earache, hoarseness, nasal congestion, nosebleeds, postnasal drainage, ringing in ears, sinus pressure, and sore throat. CV:  Denies bluish discoloration of lips or nails, chest pain or discomfort, difficulty breathing at night, difficulty breathing while lying down, fainting, fatigue, leg cramps with exertion, lightheadness, near fainting, palpitations, shortness  of breath with exertion, swelling of feet, swelling of hands, and weight gain. Resp:  Denies chest discomfort, chest pain with inspiration, cough, coughing up blood, excessive snoring, hypersomnolence, morning headaches, pleuritic, shortness of breath, sputum productive, and wheezing. GI:  Denies abdominal pain, bloody stools, change in bowel habits, constipation, dark tarry stools, diarrhea, excessive appetite, gas, hemorrhoids, indigestion, loss of appetite, nausea, vomiting, vomiting blood, and yellowish skin color; uses pravacid, now generic. GU:  Complains of nocturia; denies abnormal vaginal bleeding, decreased libido, discharge, dysuria, genital sores, hematuria, incontinence, urinary frequency, and urinary hesitancy; controls with fluid  intake. MS:  Denies joint pain, joint redness, joint swelling, loss of strength, low back pain, mid back pain, muscle aches, muscle , cramps, muscle weakness, stiffness, and thoracic pain. Derm:  Denies changes in color of skin, changes in nail beds, dryness, excessive perspiration, flushing, hair loss, insect bite(s), itching, lesion(s), poor wound healing, and rash. Neuro:  Denies brief paralysis, difficulty with concentration, disturbances in coordination, falling down, headaches, inability to speak, memory loss, numbness, poor balance, seizures, sensation of room spinning, tingling, tremors, visual disturbances, and weakness.  Physical Exam  General:  Well-developed,well-nourished,in no acute distress; alert,appropriate and cooperative throughout examination Head:  Normocephalic and atraumatic without obvious abnormalities. No apparent alopecia or balding. Eyes:  Conjunctiva clear bilaterally.  Ears:  External ear exam shows no significant lesions or deformities.  Otoscopic examination reveals clear canals, tympanic membranes are intact bilaterally without bulging, retraction, inflammation or discharge. Hearing is grossly normal bilaterally. Nose:  External nasal examination shows no deformity or inflammation. Nasal mucosa are pink and moist without lesions or exudates. Mouth:  Oral mucosa and oropharynx without lesions or exudates.  Teeth in good repair. Neck:  No deformities, masses, or tenderness noted. Chest Wall:  No deformities, masses, or tenderness noted. Breasts:  No mass, nodules, thickening, tenderness, bulging, retraction, inflamation, nipple discharge or skin changes noted.   Lungs:  Normal respiratory effort, chest expands symmetrically. Lungs are clear to auscultation, no crackles or wheezes. Heart:  Normal rate and regular rhythm. S1 and S2 normal without gallop, murmur, click, rub or other extra sounds. Abdomen:  Bowel sounds positive,abdomen soft and non-tender without masses,  organomegaly or hernias noted. Rectal:  Not done Genitalia:  Not done Msk:  No deformity or scoliosis noted of thoracic or lumbar spine.   Pulses:  R and L carotid,radial,femoral,dorsalis pedis and posterior tibial pulses are full and equal bilaterally Extremities:  No clubbing, cyanosis, edema, or deformity noted with normal full range of motion of all joints.   Neurologic:  No cranial nerve deficits noted. Station and gait are normal. Plantar reflexes are down-going bilaterally. DTRs are symmetrical throughout. Sensory, motor and coordinative functions appear intact. Skin:  Intact without suspicious lesions or rashes Cervical Nodes:  No lymphadenopathy noted Axillary Nodes:  No palpable lymphadenopathy Inguinal Nodes:  No significant adenopathy Psych:  Cognition and judgment appear intact. Alert and cooperative with normal attention span and concentration. No apparent delusions, illusions, hallucinations   Impression & Recommendations:  Problem # 1:  HEALTH MAINTENANCE EXAM (ICD-V70.0) Tdap today. Discussed Zostavax.  Problem # 2:  ELEVATED BLOOD PRESSURE WITHOUT DIAGNOSIS OF HYPERTENSION (ICD-796.2) Assessment: Unchanged  Seems to be ok except when here. She will continue to monitor and come in if consistently high.  BP today: 140/70 Prior BP: 130/80 (01/08/2009)  Labs Reviewed: Creat: 0.8 (02/09/2009) Chol: 143 (02/09/2009)   HDL: 39.50 (02/09/2009)   LDL: 76 (02/09/2009)   TG: 138.0 (02/09/2009)  Instructed in  low sodium diet (DASH Handout) and behavior modification.    Problem # 3:  HYPERLIPIDEMIA (ICD-272.4) Good control. Cont medication. Her updated medication list for this problem includes:    Vytorin 10-20 Mg Tabs (Ezetimibe-simvastatin) .Marland Kitchen... 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  Problem # 5:  HX, FAMILY, MALIGNANCY, OVARY (ICD-V16.41) Assessment: Unchanged Her ovaries are now gone.  Complete Medication List: 1)  Fish Oil 1000 Mg Caps (Omega-3 fatty acids) .... Take 2 capsule by mouth twice a day 2)  Calcium 500 Mg Tabs (Calcium) .... Take 1  once a day 3)  Prevacid 30 Mg Cpdr (Lansoprazole) .... Take 1 capsule by mouth once a day 4)  Vytorin 10-20 Mg Tabs (Ezetimibe-simvastatin) .... Take 1 tablet by mouth every night 5)  Vision Herb  .Marland Kitchen.. 1 daily by mouth 6)  Vitamin D3 10000 Unit Caps (Cholecalciferol) .Marland Kitchen.. 1 daily by mouth 7)  Vitamin E-400 400 Unit Caps (Vitamin e) .Marland Kitchen.. 1 daily by mouth 8)  Coq10 50 Mg Caps (Coenzyme q10) .... Daily by mouth 9)  Vitamin C 500 Mg  .Marland Kitchen.. 1 daily by mouth 10)  Vitamin B6/vitamin B12 and Folic Acid  .... Sublinqual daily  Patient Instructions: 1)  RTC one year for Breast/Pelvic w/o Pap.  2)  Come in for Zostavax after checking with insurance. 3)  Monitor Bp.    Appended Document: PAP SMEAR AND CPX/CLE     Clinical Lists Changes  Orders: Added new Service order of Tdap => 57yrs IM ([address]109) - Signed Added new Service order of Admin 1st Vaccine (60454) - Signed Observations: Added new observation of TD BOOST VIS: 09/18/07 version given February 11, 2009. (02/11/2009 15:42) Added new observation of TD BOOSTERLO: [id number] (02/11/2009 15:42) Added new observation of TD BOOST EXP: 12/24/2010 (02/11/2009 15:42) Added new observation of TD BOOSTERBY: Wanda Combs (02/11/2009 15:42) Added new observation of TD BOOSTERRT: IM (02/11/2009 15:42) Added new observation of TDBOOSTERDSE: 0.5 ml (02/11/2009 15:42) Added new observation of TD BOOSTERMF: GlaxoSmithKline (02/11/2009 15:42) Added new observation of TD BOOST SIT: left deltoid (02/11/2009 15:42) Added new observation of TD BOOSTER: Tdap  (02/11/2009 15:42)       Tetanus/Td Vaccine    Vaccine Type: Tdap    Site: left deltoid    Mfr: GlaxoSmithKline    Dose: 0.5 ml    Route: IM    Given by: Providence Crosby    Exp. Date: 12/24/2010    Lot #: [id number]    VIS given: 09/18/07 version given February 11, 2009.

## 2010-12-02 NOTE — Miscellaneous (Signed)
Summary: Zostavax [facility]  Zostavax [facility]   Imported By: Maryln Gottron 06/15/2010 15:26:47  _____________________________________________________________________  External Attachment:    Type:   Image     Comment:   External Document  Appended Document: Zostavax [facility] please add to immunization hx.   Appended Document: Zostavax [facility] Done

## 2010-12-02 NOTE — Assessment & Plan Note (Signed)
Summary: F/U ON LABS / LFW   Vital Signs:  Patient profile:   65 year old female Weight:      133.75 pounds BMI:     23.04 Temp:     98.3 degrees F oral Pulse rate:   64 / minute Pulse rhythm:   regular BP sitting:   158 / 88  (left arm) Cuff size:   regular  Vitals Entered By: Sydell Axon LPN (November 17, 2010 4:01 PM) CC: Follow-up after lab work   History of Present Illness: Pt here for followup of cholesterol due to stopping Vytorin because insurance wouldn't pay for it anymore and was switched to Simva 40. She has tolerated it well without problem.  Her BP today is also slightly high and she is interested in BP medication as she has significant FH of CAD and stroke. She feels well and has no complaints.  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 Take 2 Capsule By Mouth Twice A Day 2)  Calcium 500 Mg Tabs (Calcium) .... Take 1  Once A Day 3)  Prevacid 30 Mg Cpdr (Lansoprazole) .... Take 1 Capsule By Mouth Once A Day 4)  Vision Herb .Marland Kitchen.. 1 Daily By Mouth 5)  Vitamin D3 10000 Unit Caps (Cholecalciferol) .Marland Kitchen.. 1 Daily By Mouth 6)  Vitamin E-400 400 Unit Caps (Vitamin E) .Marland Kitchen.. 1 Daily By Mouth 7)  Coq10 50 Mg Caps (Coenzyme Q10) .... Daily By Mouth 8)  Vitamin C 500 Mg .Marland KitchenMarland Kitchen. 1 Daily By Mouth 9)  Vitamin B6/vitamin B12 and Folic Acid .... Sublinqual Daily 10)  Zocor 20 Mg Tabs (Simvastatin) .Marland Kitchen.. 1 By Mouth At Bedtime  Allergies: 1)  ! * Medrol Dose Pack 2)  ! Sulfa 3)  ! * Tylenol Arthritis 4)  ! Cipro 5)  ! Lipitor  Physical Exam  General:  Well-developed,well-nourished,in no acute distress;  alert,appropriate and cooperative throughout examination   Impression & Recommendations:  Problem # 1:  HYPERLIPIDEMIA (ICD-272.4) Assessment Deteriorated LDL went from 68 on Vytorin 10/20  to 106 on Simva 20. My opinion is to increase dose. Will try Simva 40. Her updated medication list for this problem includes:    Simvastatin 40 Mg Tabs (Simvastatin) ..... One tab by mouth at night  Labs Reviewed: SGOT: 24 (11/12/2010)   SGPT: 23 (11/12/2010)   HDL:34.70 (11/12/2010), 41.20 (02/11/2010)  LDL:106 (11/12/2010), 68 (04/54/0981)  Chol:160 (11/12/2010), 126 (02/11/2010)  Trig:96.0 (11/12/2010), 83.0 (02/11/2010)  Problem # 2:  HYPERTENSION (ICD-401.9) Assessment: New  Have been flirting with starting medication for BP that vacillates....will take this opportunity to start small dose of meds. Her updated medication list for this problem includes:    Metoprolol Succinate 25 Mg Xr24h-tab (Metoprolol succinate) ..... One tab by mouth at night  BP today: 158/88 Prior BP: 140/80 (02/17/2010)  Labs Reviewed: K+: 4.2 (02/11/2010) Creat: : 0.8 (02/11/2010)   Chol: 160 (11/12/2010)   HDL: 34.70 (11/12/2010)   LDL: 106 (11/12/2010)   TG: 96.0 (11/12/2010)  Complete Medication List: 1)  Fish Oil 1000 Mg Caps (Omega-3 fatty acids) .... Take 2 capsule by mouth twice a day 2)  Calcium 500 Mg Tabs (Calcium) .Marland KitchenMarland KitchenMarland Kitchen  Take 1  once a day 3)  Prevacid 30 Mg Cpdr (Lansoprazole) .... Take 1 capsule by mouth once a day 4)  Vision Herb  .Marland Kitchen.. 1 daily by mouth 5)  Vitamin D3 10000 Unit Caps (Cholecalciferol) .Marland Kitchen.. 1 daily by mouth 6)  Vitamin E-400 400 Unit Caps (Vitamin e) .Marland Kitchen.. 1 daily by mouth 7)  Coq10 50 Mg Caps (Coenzyme q10) .... Daily by mouth 8)  Vitamin C 500 Mg  .Marland Kitchen.. 1 daily by mouth 9)  Vitamin B6/vitamin B12 and Folic Acid  .... Sublinqual daily 10)  Simvastatin 40 Mg Tabs (Simvastatin) .... One tab by mouth at night 11)  Metoprolol Succinate 25 Mg Xr24h-tab (Metoprolol succinate) .... One tab by  mouth at night  Patient Instructions: 1)  SGOT, SGPT 272.4    6 WEEKS 2)  See me in 3 mos, SGOT, SGPT, CHOL PROFILE  272.4     prior  3)  May allow second appt to slip to accomodate scheduling with PE. Prescriptions: METOPROLOL SUCCINATE 25 MG XR24H-TAB (METOPROLOL SUCCINATE) one tab by mouth at night  #30 x 12   Entered and Authorized by:   Shaune Leeks MD   Signed by:   Shaune Leeks MD on 11/17/2010   Method used:   Electronically to        [facility]* (retail)       [address]       [place], [place]  [address]       Ph: [phone]       Fax: [phone]   RxID:   [id number] SIMVASTATIN 40 MG TABS (SIMVASTATIN) one tab by mouth at night  #30 x 12   Entered and Authorized by:   Shaune Leeks MD   Signed by:   Shaune Leeks MD on 11/17/2010   Method used:   Electronically to        [facility]* (retail)       [address]       [place], [place]  [address]       Ph: [phone]       Fax: [phone]   RxID:   [id number]    Orders Added: 1)  Est. Patient Level IV [30160]    Current Allergies (reviewed today): ! * MEDROL DOSE PACK ! SULFA ! * TYLENOL ARTHRITIS ! CIPRO ! LIPITOR

## 2010-12-02 NOTE — Progress Notes (Signed)
Summary: Swine flu exposure protocol  Phone Note Call from Patient Call back at Home Phone [phone] Call back at [phone] cell   Caller: Patient Call For: Dr. Hetty Ely Summary of Call: Patient says her son has been diagnosed with swine flu.  She wants to know what is the protocol for her because she has been around him recently.  She says the MD that diagnosed him was not very specific about that. Initial call taken by: Delilah Shan,  April 25, 2008 1:21 PM  Follow-up for Phone Call        How was he diagnosed?  Culture, sxs? If in the same house routinely, should be prophylaxes with Tamiflu. If occas exposure and no physical contact or coughing, observation. Follow-up by: Shaune Leeks MD,  April 25, 2008 2:05 PM  Additional Follow-up for Phone Call Additional follow up Details #1::        Left message on voicemail to return call. No answer on cell phone. Delilah Shan  April 25, 2008 3:03 PM   Patient Advised.  Additional Follow-up by: Delilah Shan,  April 25, 2008 3:29 PM

## 2010-12-02 NOTE — Assessment & Plan Note (Signed)
Summary: THROAT,NECK,ARM PAIN/CLE   Vital Signs:  Patient profile:   65 year old female Height:      64 inches Weight:      131 pounds BMI:     22.57 Temp:     97.9 degrees F oral Pulse rate:   76 / minute Pulse rhythm:   regular BP sitting:   120 / 80  (left arm) Cuff size:   regular  Vitals Entered By: Providence Crosby LPN (June 02, 2009 11:41 AM) CC: LEFT JAW TIGHTNESS/ AFFECTS LEFT ARM AND LEG AT TIMES LEFT HAND NUMB   History of Present Illness: Left numbness of left lateral jaw area that extends or possibly radiates into the left neck and upper chest are, somewhat c/w platysma distribution. She had dental work done within the last month with numbing of both posterior sides of the lower mandibular areas...possibly causing mild neuropathy and inciting the rest. She also is on the phone a lot at work with cocked posture of the neck. She alsorelates mild discomfort/mild numbness at times of the left and thigh area. She denies fever or chills did wwell with her recent hysterectomy and is almost back to normal there.  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 Take 2 Capsule By Mouth Twice A Day 2)  Calcium 500 Mg Tabs (Calcium) .... Take 1  Once A Day 3)  Prevacid 30 Mg Cpdr (Lansoprazole) .... Take 1 Capsule By Mouth Once A Day 4)  Vytorin 10-20 Mg Tabs (Ezetimibe-Simvastatin) .... Take 1 Tablet By Mouth Every Night 5)  Vision Herb .Marland Kitchen.. 1 Daily By Mouth 6)  Vitamin D3 10000 Unit Caps (Cholecalciferol) .Marland Kitchen.. 1 Daily By Mouth 7)  Vitamin E-400 400 Unit Caps (Vitamin E) .Marland Kitchen.. 1 Daily By  Mouth 8)  Coq10 50 Mg Caps (Coenzyme Q10) .... Daily By Mouth 9)  Vitamin C 500 Mg .Marland KitchenMarland Kitchen. 1 Daily By Mouth 10)  Vitamin B6/vitamin B12 and Folic Acid .... Sublinqual Daily  Allergies: 1)  ! * Medrol Dose Pack 2)  ! Sulfa 3)  ! * Tylenol Arthritis 4)  ! Cipro  Physical Exam  General:  Well-developed,well-nourished,in no acute distress; alert,appropriate and cooperative throughout examination Head:  Normocephalic and atraumatic without obvious abnormalities. No apparent alopecia or balding. Eyes:  Conjunctiva clear bilaterally.  Ears:  External ear exam shows no significant lesions or deformities.  Otoscopic examination reveals clear canals, tympanic membranes are intact bilaterally without bulging, retraction, inflammation or discharge. Hearing is grossly normal bilaterally. Nose:  External nasal examination shows no deformity or inflammation. Nasal mucosa are pink and moist without lesions or exudates. Mouth:  Oral mucosa and oropharynx without lesions or exudates.  Teeth in good repair.  Jaw nontender and no swelling noticed. Strap muscles of neck seem nml. Stenson's duct nml w/o abnmlty inside the mouth seen. Neck:  No deformities, masses, or tenderness noted. Chest Wall:  No deformities, masses, or tenderness noted. Lungs:  Normal respiratory effort, chest expands symmetrically. Lungs are clear to auscultation, no crackles or wheezes. Heart:  Normal rate and regular rhythm. S1 and S2 normal without gallop, murmur, click, rub  or other extra sounds.   Impression & Recommendations:  Problem # 1:  NECK PAIN, LEFT (ICD-723.1) Assessment New Find no acute abnormality. Reassured. Perhaps neuritis that is self resolving.  RTC if does not resolve.  Complete Medication List: 1)  Fish Oil 1000 Mg Caps (Omega-3 fatty acids) .... Take 2 capsule by mouth twice a day 2)  Calcium 500 Mg Tabs (Calcium) .... Take 1  once a day 3)  Prevacid 30 Mg Cpdr (Lansoprazole) .... Take 1 capsule by mouth once  a day 4)  Vytorin 10-20 Mg Tabs (Ezetimibe-simvastatin) .... Take 1 tablet by mouth every night 5)  Vision Herb  .Marland Kitchen.. 1 daily by mouth 6)  Vitamin D3 10000 Unit Caps (Cholecalciferol) .Marland Kitchen.. 1 daily by mouth 7)  Vitamin E-400 400 Unit Caps (Vitamin e) .Marland Kitchen.. 1 daily by mouth 8)  Coq10 50 Mg Caps (Coenzyme q10) .... Daily by mouth 9)  Vitamin C 500 Mg  .Marland Kitchen.. 1 daily by mouth 10)  Vitamin B6/vitamin B12 and Folic Acid  .... Sublinqual daily

## 2010-12-02 NOTE — Letter (Signed)
Summary: Results Follow up Letter  [facility]  [address] [place], [place] [address]   Phone: [phone]  Fax: [phone]    03/09/2009 MRN: [id number]     [facility] [address] [place], [place]  [address]      Dear Ms. Bezold,  The following are the results of your recent test(s):  Test         Result    Pap Smear:        Normal _____  Not Normal _____ Comments: ______________________________________________________ Cholesterol: LDL(Bad cholesterol):         Your goal is less than:         HDL (Good cholesterol):       Your goal is more than: Comments:  ______________________________________________________ Mammogram:        Normal _____  Not Normal _____ Comments:  ___________________________________________________________________ Hemoccult:        Normal _X____  Not normal _______ Comments:  four to six stool cards all normal  _____________________________________________________________________ Other Tests:    We routinely do not discuss normal results over the telephone.  If you desire a copy of the results, or you have any questions about this information we can discuss them at your next office visit.   Sincerely,  Dr. Laurita Quint Baldo Daub [facility])

## 2010-12-02 NOTE — Progress Notes (Signed)
Summary: pt requests zostavax  Phone Note From Pharmacy   Caller: Kelby Aline   [phone] Summary of Call: Faxed form is on your desk for you to order zostavax for the pt. Initial call taken by: Lowella Petties CMA,  May 05, 2010 12:13 PM  Follow-up for Phone Call        Okay to give.  Signed.  Please send back.  Follow-up by: Crawford Givens MD,  May 05, 2010 1:25 PM  Additional Follow-up for Phone Call Additional follow up Details #1::        Faxed. Additional Follow-up by: Delilah Shan CMA Minola Guin Dull),  May 05, 2010 3:18 PM

## 2010-12-02 NOTE — Progress Notes (Signed)
Summary: RX FOR TYLENOL #3  Phone Note Refill Request Message from:  Fax from Pharmacy on July 28, 2008 12:02 PM  Refills Requested: Medication #1:  TYLENOL/CODEINE #3 300-30 MG  TABS one tab by mouth once daily for fibroid pain.   Last Refilled: 02/11/2008 [facility] [address] [phone]  Initial call taken by: Providence Crosby,  July 28, 2008 12:02 PM  Follow-up for Phone Call        PRESCRIBTION CALLED TO PHARMACIST Follow-up by: Providence Crosby,  July 28, 2008 12:32 PM      Prescriptions: TYLENOL/CODEINE #3 300-30 MG  TABS (ACETAMINOPHEN-CODEINE) one tab by mouth once daily for fibroid pain  #30 x 0   Entered and Authorized by:   Shaune Leeks MD   Signed by:   Shaune Leeks MD on 07/28/2008   Method used:   Print then Give to Patient   RxID:   [id number]  Shaune Leeks MD  July 28, 2008 12:18 PM

## 2010-12-02 NOTE — Letter (Signed)
Summary: Results Follow up Letter   at [facility]  [address] [place], [place] [address]   Phone: [phone]  Fax: [phone]    08/27/2008 MRN: [id number]  [facility] [address] [place], [place]  [address]  Dear [name],  The following are the results of your recent test(s):  Test         Result    Pap Smear:        Normal _____  Not Normal _____ Comments: ______________________________________________________ Cholesterol: LDL(Bad cholesterol):         Your goal is less than:         HDL (Good cholesterol):       Your goal is more than: Comments:  ______________________________________________________ Mammogram:        Normal _X____  Not Normal _____ Comments:     YOUR MAMMOGRAM REPORT WAS NORMAL. PLEASE MAKE SURE TO REPEAT IN ONE YEAR. ENCLOSED IS A COPY OF YOUR REPORT.  ___________________________________________________________________ Hemoccult:        Normal _____  Not normal _______ Comments:    _____________________________________________________________________ Other Tests:    We routinely do not discuss normal results over the telephone.  If you desire a copy of the results, or you have any questions about this information we can discuss them at your next office visit.   Sincerely,

## 2010-12-02 NOTE — Assessment & Plan Note (Signed)
Summary: CHECK BP/CLE   Vital Signs:  Patient Profile:   65 Years Old Female Height:     64 inches Weight:      129 pounds Temp:     98.1 degrees F oral Pulse rate:   88 / minute Pulse rhythm:   regular BP sitting:   170 / 70  (left arm) BP standing:   140 / 74  (left arm) Cuff size:   regular  Vitals Entered ByMarland Kitchen Providence Crosby (December 12, 2008 2:13 PM)                 Chief Complaint:  Clerance Lav this am dizzy/ bp at home 155/70// later 147/70.  History of Present Illness: Here due to dizziness this morning--after got up, tightness now and elevation in BP.  New.  On no BP meds. --Gets dizziness with rapid turning of head, some wiht leaning over. --no fever or chills, no N/V, no hx of vertigo --similar episode 2wks ago which lasted few days and resolved --worked this morning   Scheduled for hysterectomy --fibroids 12/30/2008---[place]    Prior Medications Reviewed Using: Patient Recall  Current Allergies (reviewed today): ! * MEDROL DOSE PACK ! SULFA ! * TYLENOL ARTHRITIS ! CIPRO     Review of Systems  ENT      Complains of nasal congestion.      Denies ringing in ears, sinus pressure, and sore throat.  CV      Denies chest pain or discomfort and palpitations.  Resp      Denies cough, shortness of breath, and wheezing.  Neuro      See HPI      Denies difficulty with concentration, disturbances in coordination, falling down, headaches, memory loss, tremors, and weakness.  Psych      Complains of anxiety.      Denies depression.   Physical Exam  General:     alert, well-developed, well-nourished, and well-hydrated.   Ears:     R ear normal and L ear normal.   Nose:     no mucosal edema and no airflow obstruction.  sinuses neg Mouth:     pharynx pink and moist and no exudates.   Neck:     no carotid bruits.   Lungs:     normal respiratory effort, no intercostal retractions, no accessory muscle use, and normal breath sounds.   Heart:     normal  rate, regular rhythm, and no murmur.   Neurologic:     alert & oriented X3, cranial nerves II-XII intact, strength normal in all extremities, sensation intact to light touch, gait normal, DTRs symmetrical and normal, finger-to-nose normal, and Romberg negative--no palmer drift, able to heel-toe-and tandum walk without difficulty.   Skin:     turgor normal and color normal.   Cervical Nodes:     no anterior cervical adenopathy and no posterior cervical adenopathy.   Psych:     normally interactive, good eye contact, and slightly anxious.      Impression & Recommendations:  Problem # 1:  VERTIGO (ICD-780.4) Assessment: New mild vertigo discussed etiology will try low dose antivert as needed  see back if not improved in 5-7d Her updated medication list for this problem includes:    Antivert 12.5 Mg Tabs (Meclizine hcl) .Marland Kitchen... 1-2 every 8hrs as needed for   dizziness    in exam room 35 min  Problem # 2:  ELEVATED BLOOD PRESSURE WITHOUT DIAGNOSIS OF HYPERTENSION (ICD-796.2) Assessment: Comment Only will check BP  aat different times of day and bring to Dr Hetty Ely in 3-4 wks  Complete Medication List: 1)  Fish Oil 1000 Mg Caps (Omega-3 fatty acids) .... Take 2 capsule by mouth twice a day 2)  Calcium 500 Mg Tabs (Calcium) .... Take 1/2 once a day 3)  Prevacid 30 Mg Cpdr (Lansoprazole) .... Take 1 capsule by mouth once a day 4)  Vytorin 10-20 Mg Tabs (Ezetimibe-simvastatin) .... Take 1 tablet by mouth every night 5)  Vision Herb  .Marland Kitchen.. 1 daily by mouth 6)  Tylenol/codeine #3 300-30 Mg Tabs (Acetaminophen-codeine) .... One tab by mouth once daily for fibroid pain 7)  Vitamin D3 10000 Unit Caps (Cholecalciferol) .Marland Kitchen.. 1 daily by mouth 8)  Vitamin E-400 400 Unit Caps (Vitamin e) .Marland Kitchen.. 1 daily by mouth 9)  Vitamin C 500 Mg Chew (Ascorbic acid) .... As needed by mouth 10)  Coq10 50 Mg Caps (Coenzyme q10) .... Daily by mouth 11)  Antivert 12.5 Mg Tabs (Meclizine hcl) .Marland Kitchen.. 1-2 every 8hrs as  needed for   dizziness   Patient Instructions: 1)  appt with Dr Hetty Ely 3/11--BP check   Prescriptions: ANTIVERT 12.5 MG TABS (MECLIZINE HCL) 1-2 every 8hrs as needed for   dizziness  #30 x 0   Entered and Authorized by:   Gildardo Griffes FNP   Signed by:   Gildardo Griffes FNP on 12/12/2008   Method used:   Print then Give to Patient   RxID:   [id number]

## 2010-12-02 NOTE — Medication Information (Signed)
Summary: [facility]av[facility]  [facility]   Imported By: Lanelle Bal 05/11/2010 09:45:08  _____________________________________________________________________  External Attachment:    Type:   Image     Comment:   External Document

## 2010-12-06 ENCOUNTER — Telehealth: Payer: Self-pay | Admitting: Family Medicine

## 2010-12-16 NOTE — Progress Notes (Signed)
Summary: stopped zocor  Phone Note Call from Patient Call back at Work Phone [phone]   Caller: Patient Call For: Shaune Leeks MD Summary of Call: Pt states she was having muscle pain with the increased dose of simvastatin, so she stopped taking this last thursday.  She is asking if she needs to be taking something else.  [facility].                    Lowella Petties CMA, AAMA  December 06, 2010 11:42 AM   Follow-up for Phone Call        Stay off med for now, cnx lab in Mar, keep lab end of Apr and see me early May as scheduled. Follow-up by: Shaune Leeks MD,  December 06, 2010 3:44 PM  Additional Follow-up for Phone Call Additional follow up Details #1::        Advised pt, march lab appt cancelled. Additional Follow-up by: Lowella Petties CMA, AAMA,  December 07, 2010 11:20 AM

## 2010-12-31 ENCOUNTER — Other Ambulatory Visit: Payer: Self-pay

## 2011-01-08 ENCOUNTER — Encounter: Payer: Self-pay | Admitting: Family Medicine

## 2011-01-12 ENCOUNTER — Encounter: Payer: Self-pay | Admitting: Family Medicine

## 2011-01-12 ENCOUNTER — Ambulatory Visit (INDEPENDENT_AMBULATORY_CARE_PROVIDER_SITE_OTHER): Payer: Medicare Other | Admitting: Family Medicine

## 2011-01-12 DIAGNOSIS — E785 Hyperlipidemia, unspecified: Secondary | ICD-10-CM

## 2011-01-12 DIAGNOSIS — I10 Essential (primary) hypertension: Secondary | ICD-10-CM

## 2011-01-18 NOTE — Assessment & Plan Note (Signed)
Summary: DISCUSS MEDICATION/CLE   BLUE MEDICARE   Vital Signs:  Patient profile:   65 year old female Weight:      136.25 pounds Temp:     98.6 degrees F oral Pulse rate:   92 / minute Pulse rhythm:   regular BP sitting:   160 / 80  (left arm) Cuff size:   regular  Vitals Entered By: Sydell Axon LPN (January 12, 2011 12:05 PM) CC: Discuss medications   History of Present Illness: Pt was switched to Simva 40 due to Vytorin not covered. Had originally been on 20 but not adequate. She called with muscle aches and pains and called here and was told to stop the Simva. She then started Metoprolol for her BP elevation and had similar sxs and stopped the Metoprolol.  She had sxs with Lipitor in the past. She feels well today with no acute complaints.  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 Take 2 Capsule By Mouth Twice A Day 2)  Calcium 500 Mg Tabs (Calcium) .... Take 1  Once A Day 3)  Prevacid 30 Mg Cpdr (Lansoprazole) .... Take 1 Capsule By Mouth Once A Day 4)  Vision Herb .Marland Kitchen.. 1 Daily By Mouth 5)  Vitamin D3 10000 Unit Caps (Cholecalciferol) .Marland Kitchen.. 1 Daily By Mouth 6)  Vitamin E-400 400 Unit Caps (Vitamin E) .Marland Kitchen.. 1 Daily By Mouth 7)  Coq10 50 Mg Caps (Coenzyme Q10) .... Daily By Mouth 8)  Vitamin C 500 Mg .Marland KitchenMarland Kitchen. 1 Daily By Mouth 9)  Vitamin B6/vitamin B12 and Folic Acid .... Sublinqual Daily 10)  Simvastatin 40 Mg Tabs (Simvastatin) .... One Tab By Mouth At Night 11)  Metoprolol Succinate 25 Mg Xr24h-Tab (Metoprolol Succinate) .... One Tab By Mouth At Night  Allergies: 1)  ! *  Medrol Dose Pack 2)  ! Sulfa 3)  ! * Tylenol Arthritis 4)  ! Cipro 5)  ! Lipitor  Physical Exam  General:  Well-developed,well-nourished,in no acute distress; alert,appropriate and cooperative throughout examination Head:  Normocephalic and atraumatic without obvious abnormalities. No apparent alopecia or balding. Sinuses NT. Eyes:  Conjunctiva clear bilaterally.  Ears:  External ear exam shows no significant lesions or deformities.  Otoscopic examination reveals clear canals, tympanic membranes are intact bilaterally without bulging, retraction, inflammation or discharge. Hearing is grossly normal bilaterally. Nose:  External nasal examination shows no deformity or inflammation. Nasal mucosa are pink and moist without lesions or exudates. Mouth:  Oral mucosa and oropharynx without lesions or exudates.  Teeth in good repair.  Jaw nontender and no swelling noticed. Neck:  No deformities, masses, or tenderness noted. Lungs:  Normal respiratory effort, chest expands symmetrically. Lungs are clear to auscultation, no crackles or wheezes. Heart:  Normal rate and regular rhythm. S1 and S2 normal without gallop, murmur, click, rub or other extra sounds. Abdomen:  Bowel sounds positive,abdomen soft and non-tender without masses, organomegaly or hernias noted.   Impression & Recommendations:  Problem # 1:  HYPERTENSION (ICD-401.9) Assessment Unchanged Start Lisinopril in place of Metoprolol. Recheck as instructions. The following medications were removed from the medication list:    Metoprolol  Succinate 25 Mg Xr24h-tab (Metoprolol succinate) ..... One tab by mouth at night Her updated medication list for this problem includes:    Lisinopril 5 Mg Tabs (Lisinopril) ..... One tab by mouth at night  BP today: 160/80 Prior BP: 158/88 (11/17/2010)  Labs Reviewed: K+: 4.2 (02/11/2010) Creat: : 0.8 (02/11/2010)   Chol: 160 (11/12/2010)   HDL: 34.70 (11/12/2010)   LDL: 106 (11/12/2010)   TG: 96.0  (11/12/2010)  Problem # 2:  HYPERLIPIDEMIA (ICD-272.4) Assessment: Unchanged Needs to be on statin. Trial of Prave after on Lisinopril and increased Co Q 10. The following medications were removed from the medication list:    Simvastatin 40 Mg Tabs (Simvastatin) ..... One tab by mouth at night Her updated medication list for this problem includes:    Pravachol 40 Mg Tabs (Pravastatin sodium) ..... One tab by mouth at night.  Labs Reviewed: SGOT: 24 (11/12/2010)   SGPT: 23 (11/12/2010)   HDL:34.70 (11/12/2010), 41.20 (02/11/2010)  LDL:106 (11/12/2010), 68 (16/08/9603)  Chol:160 (11/12/2010), 126 (02/11/2010)  Trig:96.0 (11/12/2010), 83.0 (02/11/2010)  Complete Medication List: 1)  Fish Oil 1000 Mg Caps (Omega-3 fatty acids) .... Take one by mouth daily 2)  Calcium 500 Mg Tabs (Calcium) .... Take 1  once a day 3)  Prevacid 30 Mg Cpdr (Lansoprazole) .... Take 1 capsule by mouth once a day 4)  Vision Herb  .Marland Kitchen.. 1 daily by mouth 5)  Vitamin D3 10000 Unit Caps (Cholecalciferol) .Marland Kitchen.. 1 daily by mouth 6)  Vitamin E-400 400 Unit Caps (Vitamin e) .Marland Kitchen.. 1 daily by mouth 7)  Coq10 50 Mg Caps (Coenzyme q10) .... Daily by mouth 8)  Vitamin C 500 Mg  .Marland Kitchen.. 1 daily by mouth 9)  Vitamin B6/vitamin B12 and Folic Acid  .... Sublinqual daily 10)  Lisinopril 5 Mg Tabs (Lisinopril) .... One tab by mouth at night 11)  Pravachol 40 Mg Tabs (Pravastatin sodium) .... One tab by mouth at night.  Patient Instructions: 1)  Increase Co Q 10 and start on Lisinopril. 2)  Then add Prava and recheck for scheduled PE. Come back sooner if doesn't tolerate Lisinopril. Prescriptions: PRAVACHOL 40 MG TABS (PRAVASTATIN SODIUM) one tab by mouth at night.  #30 x 12   Entered and Authorized by:   Shaune Leeks MD   Signed by:   Shaune Leeks MD on 01/12/2011   Method used:   Electronically to        [facility]* (retail)       [address]       [place], [place]  [address]       Ph: [phone]        Fax: [phone]   RxID:   [id number] LISINOPRIL 5 MG TABS (LISINOPRIL) one tab by mouth at night  #90 x 3   Entered and Authorized by:   Shaune Leeks MD   Signed by:   Shaune Leeks MD on 01/12/2011   Method used:   Electronically to        [facility]* (retail)       [address]       [place], [place]  [address]       Ph: [phone]       Fax: [phone]   RxID:   [id number]    Orders Added: 1)  Est. Patient Level IV [30160]    Current Allergies (reviewed today): ! * MEDROL DOSE PACK ! SULFA ! * TYLENOL ARTHRITIS ! CIPRO ! LIPITOR

## 2011-02-09 ENCOUNTER — Other Ambulatory Visit: Payer: Self-pay | Admitting: Family Medicine

## 2011-02-09 DIAGNOSIS — Z1231 Encounter for screening mammogram for malignant neoplasm of breast: Secondary | ICD-10-CM

## 2011-02-10 LAB — CBC
HCT: 32.8 % — ABNORMAL LOW (ref 36.0–46.0)
Hemoglobin: 11.1 g/dL — ABNORMAL LOW (ref 12.0–15.0)
MCHC: 33.8 g/dL (ref 30.0–36.0)
MCV: 92.5 fL (ref 78.0–100.0)
Platelets: 115 10*3/uL — ABNORMAL LOW (ref 150–400)
RBC: 3.55 MIL/uL — ABNORMAL LOW (ref 3.87–5.11)
RDW: 13.9 % (ref 11.5–15.5)
WBC: 11.3 10*3/uL — ABNORMAL HIGH (ref 4.0–10.5)

## 2011-02-12 ENCOUNTER — Encounter: Payer: Self-pay | Admitting: Family Medicine

## 2011-02-13 ENCOUNTER — Other Ambulatory Visit: Payer: Self-pay | Admitting: Family Medicine

## 2011-02-13 DIAGNOSIS — I10 Essential (primary) hypertension: Secondary | ICD-10-CM

## 2011-02-13 DIAGNOSIS — E785 Hyperlipidemia, unspecified: Secondary | ICD-10-CM

## 2011-02-13 DIAGNOSIS — E749 Disorder of carbohydrate metabolism, unspecified: Secondary | ICD-10-CM

## 2011-02-15 LAB — CBC
HCT: 43.3 % (ref 36.0–46.0)
Hemoglobin: 14.5 g/dL (ref 12.0–15.0)
MCHC: 33.6 g/dL (ref 30.0–36.0)
MCV: 91.9 fL (ref 78.0–100.0)
Platelets: 134 10*3/uL — ABNORMAL LOW (ref 150–400)
RBC: 4.71 MIL/uL (ref 3.87–5.11)
RDW: 14.3 % (ref 11.5–15.5)
WBC: 6.5 10*3/uL (ref 4.0–10.5)

## 2011-02-15 LAB — COMPREHENSIVE METABOLIC PANEL
ALT: 34 U/L (ref 0–35)
AST: 28 U/L (ref 0–37)
Albumin: 4 g/dL (ref 3.5–5.2)
Alkaline Phosphatase: 51 U/L (ref 39–117)
BUN: 14 mg/dL (ref 6–23)
CO2: 28 mEq/L (ref 19–32)
Calcium: 9.4 mg/dL (ref 8.4–10.5)
Chloride: 105 mEq/L (ref 96–112)
Creatinine, Ser: 0.66 mg/dL (ref 0.4–1.2)
GFR calc Af Amer: >60 mL/min (ref >60)
GFR calc non Af Amer: >60 mL/min (ref >60)
Glucose, Bld: 103 mg/dL — ABNORMAL HIGH (ref 70–99)
Potassium: 4.3 mEq/L (ref 3.5–5.1)
Sodium: 139 mEq/L (ref 135–145)
Total Bilirubin: 1 mg/dL (ref 0.3–1.2)
Total Protein: 6.6 g/dL (ref 6.0–8.3)

## 2011-02-18 ENCOUNTER — Other Ambulatory Visit: Payer: Self-pay

## 2011-03-02 ENCOUNTER — Encounter: Payer: Self-pay | Admitting: Family Medicine

## 2011-03-03 ENCOUNTER — Ambulatory Visit
Admission: RE | Admit: 2011-03-03 | Discharge: 2011-03-03 | Disposition: A | Discharge status: Home / Self Care | Payer: Medicare Other | Source: Ambulatory Visit | Attending: Family Medicine | Admitting: Family Medicine

## 2011-03-03 DIAGNOSIS — Z1231 Encounter for screening mammogram for malignant neoplasm of breast: Secondary | ICD-10-CM

## 2011-03-07 ENCOUNTER — Other Ambulatory Visit: Payer: Self-pay | Admitting: *Deleted

## 2011-03-07 MED ORDER — LANSOPRAZOLE 30 MG PO CPDR: 30.0000 mg | DELAYED_RELEASE_CAPSULE | Freq: Every day | ORAL | Status: DC

## 2011-03-07 NOTE — Telephone Encounter (Signed)
Medication refilled

## 2011-03-09 ENCOUNTER — Other Ambulatory Visit: Payer: Self-pay | Admitting: *Deleted

## 2011-03-09 NOTE — Telephone Encounter (Signed)
Refill already done on 03/07/2011.

## 2011-03-10 ENCOUNTER — Other Ambulatory Visit: Payer: Self-pay | Admitting: Family Medicine

## 2011-03-10 DIAGNOSIS — E785 Hyperlipidemia, unspecified: Secondary | ICD-10-CM

## 2011-03-10 DIAGNOSIS — I10 Essential (primary) hypertension: Secondary | ICD-10-CM

## 2011-03-10 DIAGNOSIS — E749 Disorder of carbohydrate metabolism, unspecified: Secondary | ICD-10-CM

## 2011-03-11 ENCOUNTER — Other Ambulatory Visit (INDEPENDENT_AMBULATORY_CARE_PROVIDER_SITE_OTHER): Payer: Medicare Other | Admitting: Family Medicine

## 2011-03-11 DIAGNOSIS — Z13220 Encounter for screening for lipoid disorders: Secondary | ICD-10-CM

## 2011-03-11 DIAGNOSIS — I10 Essential (primary) hypertension: Secondary | ICD-10-CM

## 2011-03-11 DIAGNOSIS — E785 Hyperlipidemia, unspecified: Secondary | ICD-10-CM

## 2011-03-11 DIAGNOSIS — E749 Disorder of carbohydrate metabolism, unspecified: Secondary | ICD-10-CM

## 2011-03-11 LAB — HEPATIC FUNCTION PANEL
ALT: 20 U/L (ref 0–35)
AST: 22 U/L (ref 0–37)
Albumin: 3.5 g/dL (ref 3.5–5.2)
Alkaline Phosphatase: 50 U/L (ref 39–117)
Bilirubin, Direct: 0.1 mg/dL (ref 0.0–0.3)
Total Bilirubin: 0.5 mg/dL (ref 0.3–1.2)
Total Protein: 6 g/dL (ref 6.0–8.3)

## 2011-03-11 LAB — RENAL FUNCTION PANEL
Albumin: 3.5 g/dL (ref 3.5–5.2)
BUN: 15 mg/dL (ref 6–23)
CO2: 29 mEq/L (ref 19–32)
Calcium: 9.2 mg/dL (ref 8.4–10.5)
Chloride: 106 mEq/L (ref 96–112)
Creatinine, Ser: 0.7 mg/dL (ref 0.4–1.2)
GFR: 93.36 mL/min (ref >60.00)
Glucose, Bld: 89 mg/dL (ref 70–99)
Phosphorus: 2.8 mg/dL (ref 2.3–4.6)
Potassium: 4.8 mEq/L (ref 3.5–5.1)
Sodium: 142 mEq/L (ref 135–145)

## 2011-03-11 LAB — LIPID PANEL
Cholesterol: 175 mg/dL (ref 0–200)
HDL: 30.6 mg/dL — ABNORMAL LOW (ref >39.00)
Total CHOL/HDL Ratio: 6
Triglycerides: 372 mg/dL — ABNORMAL HIGH (ref 0.0–149.0)
VLDL: 74.4 mg/dL — ABNORMAL HIGH (ref 0.0–40.0)

## 2011-03-11 LAB — BASIC METABOLIC PANEL
BUN: 15 mg/dL (ref 6–23)
CO2: 29 mEq/L (ref 19–32)
Calcium: 9.2 mg/dL (ref 8.4–10.5)
Chloride: 106 mEq/L (ref 96–112)
Creatinine, Ser: 0.7 mg/dL (ref 0.4–1.2)
GFR: 93.36 mL/min (ref >60.00)
Glucose, Bld: 89 mg/dL (ref 70–99)
Potassium: 4.8 mEq/L (ref 3.5–5.1)
Sodium: 142 mEq/L (ref 135–145)

## 2011-03-11 LAB — TSH: TSH: 1.61 u[IU]/mL (ref 0.35–5.50)

## 2011-03-11 LAB — LDL CHOLESTEROL, DIRECT: Direct LDL: 83.8 mg/dL

## 2011-03-15 NOTE — H&P (Signed)
[name], [name]              ACCOUNT NO.:  [id number]   MEDICAL RECORD NO.:  [id number]          PATIENT TYPE:  AMB   LOCATION:                                [facility]ILITY:  [facility]   PHYSICIAN:  Guy Sandifer. Henderson Cloud, M.D. DATE OF BIRTH:  1946/10/27   DATE OF ADMISSION:  12/30/2008  DATE OF DISCHARGE:                              HISTORY & PHYSICAL   REASON FOR ADMISSION:  Uterine fibroids.   HISTORY OF PRESENT ILLNESS:  This patient is a 65 year old married white  female G0, P0 who is postmenopausal with known uterine leiomyomata.  She  has had on again off again episodes of pelvic discomfort and pressure  for several years.  Ultrasound in my office on September 15, 2008,  reveals the uterus measuring 8.2 x 4.4 x 7.8 cm.  There are at least  four intramural leiomyomata noted ranging from 2.6-5.1 cm and a  pedunculated right-sided calcified fibroid measuring 3.8 cm.  No adnexal  masses or free fluid were noted.  We have discussed options.  She is  being admitted for laparoscopically-assisted vaginal hysterectomy with  bilateral salpingo-oophorectomy.  Potential risks and complications have  been reviewed preoperatively.   PAST MEDICAL HISTORY:  1. Reflux.  2. Hyperlipidemia.   PAST SURGICAL HISTORY:  1. Femoral hernia repair 1976.  2. Excision of benign breast mass 1977.  3. Carpal tunnel on the right wrist 1978.  4. D&C 2001.  5. Endoscopy 2002.  6. Facelift 2005.  7. Colonoscopy with endoscopy 2009.   MEDICATIONS:  Prevacid 30 mg daily, Vytorin 10/20 once a day.   ALLERGIES:  SULFA leading to itching, EPINEPHRINE leading to tachycardia   FAMILY HISTORY:  Positive for heart disease, thyroid disease,  osteoporosis, arthritis, diabetes, hypertension, and ovarian cancer in  her sister.   SOCIAL HISTORY:  Denies tobacco, alcohol, or drug abuse.   REVIEW OF SYSTEMS:  NEURO:  Slight dizziness on occasion.  PULMONARY:  No shortness of breath.  GI:  Denies bowel changes.   PHYSICAL EXAMINATION:  VITAL SIGNS:  Height 5 feet 5 inches, weight  129.6 pounds.  HEENT: Without thyromegaly.  LUNGS:  Clear to auscultation.  HEART:  Regular rate and rhythm.  ABDOMEN:  Soft, nontender without masses.  PELVIC:  Vulva, vagina, and cervix without lesion.  Uterus is enlarged,  irregular in contour.  Adnexa nontender without masses.  EXTREMITIES:  Grossly within normal limits.  NEUROLOGICAL:  Grossly within normal limits.   ASSESSMENT:  Uterine leiomyomata.   PLAN:  Laparoscopically-assisted vaginal hysterectomy with bilateral  salpingo-oophorectomy.      Guy Sandifer Henderson Cloud, M.D.  Electronically Signed     JET/MEDQ  D:  12/17/2008  T:  12/18/2008  Job:  [id number]

## 2011-03-15 NOTE — Discharge Summary (Signed)
[name], [name]              ACCOUNT NO.:  [id number]   MEDICAL RECORD NO.:  [id number]          PATIENT TYPE:  OIB   LOCATION:  9303                          [facility]ACILITY:  [facility]   PHYSICIAN:  Guy Sandifer. Henderson Cloud, M.D. DATE OF BIRTH:  1946-08-26   DATE OF ADMISSION:  12/30/2008  DATE OF DISCHARGE:  12/31/2008                               DISCHARGE SUMMARY   ADMITTING DIAGNOSIS:  Uterine leiomyomata.   DISCHARGE DIAGNOSIS:  Uterine leiomyomata.   PROCEDURE:  On December 30, 2008, is laparoscopically-assisted vaginal  hysterectomy and bilateral salpingo-oophorectomy.   REASON FOR ADMISSION:  This patient is a 65 year old married white  female, G0, P0 with symptomatic uterine leiomyomata.  Details are  dictated in the history and physical.  She is admitted for surgical  management.   HOSPITAL COURSE:  The patient was admitted to the hospital and undergoes  the above procedure.  Estimated blood loss was 150 mL.  On the evening  of surgery, she has good pain relief.  She had some nausea one time that  was relieved with Zofran.  Vital signs are stable.  Urine output is  clear.  Abdomen is flat and soft with good bowel sounds.  On the day of  discharge, she is ambulating, tolerating a regular diet, has not yet  passed flatus, but has good pain relief.  Vital signs are stable.  She  is afebrile.  Hemoglobin is 11.1 and pathology is pending.  Abdomen is  flat and soft.  She will be given a Dulcolax suppository and discharged  home after passing flatus.   CONDITION ON DISCHARGE:  Good.   DIET:  Regular as tolerated.   ACTIVITY:  No lifting, no operation of automobiles, no vaginal entry.  She is to call the office for problems including not limited to  temperature 101 degrees, persistent nausea, vomiting, heavy bleeding, or  increasing pain.   MEDICATIONS:  1. Percocet 5/325 mg #40 one to two p.o. q.6 h. p.r.n.  2. Ibuprofen 600 mg q.6 h. p.r.n. for 2 days, then p.r.n.  3. Multivitamin  daily.   FOLLOWUP:  In the office in 2 weeks.      Guy Sandifer Henderson Cloud, M.D.  Electronically Signed     JET/MEDQ  D:  12/31/2008  T:  12/31/2008  Job:  [id number]

## 2011-03-15 NOTE — Op Note (Signed)
NAMESHERIDAN, [name]              ACCOUNT NO.:  [id number]   MEDICAL RECORD NO.:  [id number]          PATIENT TYPE:  OIB   LOCATION:  9303                          [facility]ILITY:  [facility]   PHYSICIAN:  Guy Sandifer. Henderson Cloud, M.D. DATE OF BIRTH:  February 22, 1946   DATE OF PROCEDURE:  12/30/2008  DATE OF DISCHARGE:                               OPERATIVE REPORT   PREOPERATIVE DIAGNOSIS:  Uterine leiomyomata.   POSTOPERATIVE DIAGNOSIS:  Uterine leiomyomata.   PROCEDURE:  Laparoscopically-assisted vaginal hysterectomy with  bilateral salpingo-oophorectomy.   SURGEON:  Guy Sandifer. Henderson Cloud, MD   ASSISTANT:  Zelphia Cairo, MD   ANESTHESIA:  General with endotracheal intubation.   SPECIMENS:  Uterus, bilateral tubes, and ovaries to Pathology.   ESTIMATED BLOOD LOSS:  150 mL.   INDICATIONS AND CONSENT:  This patient is a 65 year old married white  female G0, P0, postmenopausal with symptomatic uterine leiomyomata.  The  details are dictated in the history and physical.  Laparoscopically-  assisted vaginal hysterectomy with bilateral salpingo-oophorectomy has  been discussed with the patient preoperatively.  Potential risks and  complications were discussed preoperatively including, but not limited  to infection, organ damage, bleeding requiring transfusion of blood  products with HIV and hepatitis acquisition, DVT, PE, pneumonia, fistula  formation, pelvic pain, dyspareunia, and laparotomy.  All questions have  been answered and consent signed on the chart.   FINDINGS:  Upper abdomen had some filmy adhesions in the right upper  quadrant.  The appendix is normal.  Uterus is at least 8 weeks in size  with several leiomyomata.  There are at least 2 pedunculated leiomyoma  measuring of 3-5 cm on the fundus.  Tubes and ovaries were normal  bilaterally.   PROCEDURE IN DETAIL:  The patient was taken to the operating room where  she was identified, placed in dorsal supine position, and general  anesthesia  was induced via endotracheal intubation.  She was then placed  in the dorsal lithotomy position where she was prepped abdominally and  vaginally.  Bladder straight catheterized.  Hulka tenaculum was placed  in the uterus as a manipulator and she was draped in a sterile fashion.  The infraumbilical and suprapubic areas were injected in the midline  with 1% plain Xylocaine.  A small infraumbilical incision was made and a  disposable Veress needle was placed without difficulty.  Normal syringe  and drop test were noted.  Two liters of gas were then insufflated under  low pressure with good tympany in the right upper quadrant.  Veress  needle was removed and a 10/11 Xcel bladeless disposable trocar sleeve  was placed using direct visualization with the diagnostic laparoscope.  After placement, the operative laparoscope was placed.  A small  suprapubic incision was made in the midline and a 5-mm Xcel bladeless  disposable trocar sleeve was placed under direct visualization without  difficulty.  The above findings were noted.  Using the Gyrus bipolar  cautery cutting instrument, the right infundibulopelvic ligament was  taken down.  It was carried across the broad ligament to the round  ligament, and taken down to the level  of the vesicouterine peritoneum.  Same procedure was done on the left side.  Good hemostasis was  maintained.  Vesicouterine peritoneum was taken down cephalad laterally.  A good hemostasis was again noted.  Suprapubic trocar sleeve was  removed, the instruments were removed, and the attention was turned to  the vagina.  Posterior cul-de-sac was entered sharply and the cervix was  circumscribed with unipolar cautery.  Mucosa was advanced sharply and  bluntly.  Then using the Gyrus bipolar cautery instrument, uterosacral  ligaments and the bladder pillars were taken bilaterally.  Cardinal  ligaments were taken down.  There were multiple 3- to 5-cm leiomyomata,  which were  enucleated to decompress the uterus.  This allows safe entry  into the anterior cul-de-sac.  This leads to rotation of the fundus with  the tubes and ovaries.  The remaining pedicles were taken down with the  Gyrus and the remainder of the specimen was delivered out.  The  uterosacral ligaments were then plicated and the vaginal cuff with  separate sutures of 0 Monocryl.  All suture will be 0 Monocryl unless  otherwise designated.  Uterosacral ligaments were then plicated in the  midline with a third suture.  Cuff was closed with figure-of-eight.  Foley catheter was placed in the bladder and clear urine was noted.  Attention was returned to the abdomen.  Pneumoperitoneum was  reintroduced.  The suprapubic trocar sleeve was replaced under direct  visualization, and irrigation was carried out.  Minor oozing was  controlled with bipolar cautery on the peritoneal edges.  The ureters  were noted to be peristalsing normally bilaterally.  Excess fluid was  removed.  Another 10 mL of 1% plain Xylocaine instilled into the  peritoneal cavity.  Instruments were removed.  Suprapubic trocar sleeve  was removed.  Pneumoperitoneum was reduced and the umbilical trocar  sleeve was removed.  All counts were correct.  The patient was awakened,  taken to recovery room in stable condition.      Guy Sandifer Henderson Cloud, M.D.  Electronically Signed     JET/MEDQ  D:  12/30/2008  T:  12/30/2008  Job:  [id number]

## 2011-03-16 ENCOUNTER — Ambulatory Visit (INDEPENDENT_AMBULATORY_CARE_PROVIDER_SITE_OTHER): Payer: Medicare Other | Admitting: Family Medicine

## 2011-03-16 ENCOUNTER — Encounter: Payer: Self-pay | Admitting: Family Medicine

## 2011-03-16 DIAGNOSIS — E749 Disorder of carbohydrate metabolism, unspecified: Secondary | ICD-10-CM

## 2011-03-16 DIAGNOSIS — Z0000 Encounter for general adult medical examination without abnormal findings: Secondary | ICD-10-CM

## 2011-03-16 DIAGNOSIS — E785 Hyperlipidemia, unspecified: Secondary | ICD-10-CM

## 2011-03-16 DIAGNOSIS — I10 Essential (primary) hypertension: Secondary | ICD-10-CM

## 2011-03-16 LAB — MICROALBUMIN / CREATININE URINE RATIO
Creatinine,U: 30.5 mg/dL
Microalb Creat Ratio: 9.8 mg/g (ref 0.0–30.0)
Microalb, Ur: 3 mg/dL — ABNORMAL HIGH (ref 0.0–1.9)

## 2011-03-16 NOTE — Assessment & Plan Note (Signed)
Great control. Cont curr meds. BP Readings from Last 3 Encounters:  03/16/11 132/74  01/12/11 160/80  11/17/10 158/88

## 2011-03-16 NOTE — Progress Notes (Signed)
  Subjective:    Patient ID: [name], female    DOB: 1946/08/24, 65 y.o.   MRN: [id number]  HPI Pt here for Comp Exam.    Review of Systems  Constitutional: Negative for fever, chills, diaphoresis, fatigue and unexpected weight change.  HENT: Negative for hearing loss, ear pain, rhinorrhea, trouble swallowing and tinnitus.   Eyes: Negative for pain, discharge and visual disturbance.       Wears contacts.  Respiratory: Negative for cough, shortness of breath and wheezing.   Cardiovascular: Negative for chest pain, palpitations and leg swelling.       No Fainting or Fatigue.  Gastrointestinal: Negative for nausea, vomiting, abdominal pain, diarrhea, constipation and blood in stool.       No Heartburn if takes Prevacid.  Genitourinary: Negative for dysuria and frequency.  Musculoskeletal: Negative for myalgias, back pain and arthralgias.  Skin: Negative for rash.       No Itching or Dryness.  Neurological: Negative for tremors and numbness.       No Tingling. No Balance Problems.  Hematological: Negative for adenopathy. Does not bruise/bleed easily.  Psychiatric/Behavioral: Negative for dysphoric mood and agitation.       Objective:   Physical Exam  Constitutional: She is oriented to person, place, and time. She appears well-developed and well-nourished. No distress.  HENT:  Head: Normocephalic and atraumatic.  Left Ear: External ear normal.  Nose: Nose normal.  Mouth/Throat: Oropharynx is clear and moist. No oropharyngeal exudate.  Eyes: Conjunctivae and EOM are normal. Pupils are equal, round, and reactive to light. No scleral icterus.  Neck: Normal range of motion. Neck supple. No thyromegaly present.  Cardiovascular: Normal rate, regular rhythm and normal heart sounds.  Exam reveals no friction rub.   No murmur heard. Pulmonary/Chest: Effort normal and breath sounds normal. No respiratory distress. She has no wheezes. She has no rales.  Abdominal: Soft. Bowel sounds  are normal. She exhibits no mass. There is no tenderness.  Genitourinary: Vagina normal. Guaiac negative stool.       Bimanual only done Introitus wnl, Uterus and Cervix absent, Adnexa nontender w/o mass, ovaries not felt.   Musculoskeletal: Normal range of motion. She exhibits no edema and no tenderness.  Lymphadenopathy:    She has no cervical adenopathy.  Neurological: She is alert and oriented to person, place, and time. She has normal reflexes.  Skin: Skin is warm and dry. No rash noted. She is not diaphoretic. No erythema.  Psychiatric: She has a normal mood and affect. Her behavior is normal. Judgment and thought content normal.          Assessment & Plan:  HMPE  I have personally reviewed the Medicare Annual Wellness questionnaire and have noted 1. The patient's medical and social history 2. Their use of alcohol, tobacco or illicit drugs 3. Their current medications and supplements 4. The patient's functional ability including ADL's, fall risks, home safety risks and hearing or visual             impairment. 5. Diet and physical activities 6. Evidence for depression or mood disorders

## 2011-03-16 NOTE — Patient Instructions (Signed)
GERD can be treated with medication but also with lifestyle changes including avoidance of late meals, excess alcohol, smoking cessation, avoiding spicy foods, chocolate, peppermint, colas, red wine and  acidic juices such as orange or tomato juice. Do not take mint or menthol products, use sugarless candy instead (Jolly Ranchers)

## 2011-03-16 NOTE — Assessment & Plan Note (Signed)
LDL great on curr meds, continue. Trigs high and discussed. Recheck next year.

## 2011-03-16 NOTE — Assessment & Plan Note (Signed)
Better altho Trigs high this year for the first time. Sample taken around the death of her sister.  Encouraged to be careful with sweets and carbs.

## 2011-03-18 NOTE — H&P (Signed)
[place]  Patient:    CUCA, BENASSI                     MRN: [id number] Adm. Date:  [id number] Attending:  Molpus, John L CC:         Dr. Almira Bar, [facility], [place] D. Arlyce Dice, M.D. [facility]   History and Physical  DATE OF BIRTH:  [birth year]  CHIEF COMPLAINT:  Chest pressure, left side.  HISTORY OF PRESENT ILLNESS:  The patient is a 65 year old white female with periodic chest pressure sensation starting yesterday.  Off and on with the last episode radiating to the left breast, 6 out of 10 of intensity without accompanying shortness of breath, nausea or vomiting.  She did think it improved with rest.  She received some nitroglycerin in the emergency room. At present, her discomfort is 2 out of 10.  CURRENT MEDICATIONS:  Prilosec 20 mg daily, vitamin E, vitamin C, femhrt, and ________.  PAST MEDICAL HISTORY:  GERD, EGD done two weeks ago by Dr. Arlyce Dice with dilatation.  _________ stricture.  ALLERGIES:  None.  SOCIAL HISTORY:  She is a nonsmoker and nondrinker.  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  FAMILY HISTORY:  Father died at age of 77, had MI at 74.  Uncles with heart attacks.  Mother died of a CVA.  REVIEW OF SYSTEMS:  GERD symptoms periodic with occasional chest pain, but not like this one.  Blood pressure is normal at home.  She brings the records.  No blood in the stool.  No syncope.  No sweats.  The rest is negative.  PHYSICAL EXAMINATION:  VITAL SIGNS:  Blood pressure 134/75, pulse 72, respirations 18, and O2 saturations 90% on room air.  Temperature 98.9.  GENERAL:  In no acute distress.  HEENT:  With moist mucosa.  NECK:  Supple, no thyromegaly or bruit.  LUNGS:  Clear to auscultation and percussion.  HEART:  With S1 and S2.  No murmurs or gallops.  ABDOMEN:  Soft, nontender, no organomegaly and no mass felt.  Slight tenderness over epigastric area.  CHEST:  Wall  nontender.  EXTREMITIES:  Lower extremities without edema.  Calves nontender.  NEUROLOGIC:  No neurologic signs.  LABORATORY DATA:  White count 6.8, hemoglobin 13.4, platelets 165.  CK 46, MB 0.4, troponin 0.01.  Potassium 4.5, glucose 105, creatinine 1.1.  Chest x-ray is pending.  EKG with normal sinus rhythm.  ASSESSMENT AND PLAN: 1. Atypical chest pain, rule out myocardial infarction.  Obtain serial CKs and    troponins.  Consider obtaining an echocardiogram to rule out pericarditis.    Cardiolite stress test as an inpatient or outpatient depending how she    does.  Will give baby aspirin. 2. Gastroesophageal reflux disease with stricture.  Will use Librax, Prilosec,    and ______ 150 q.h.s. 3. Hypertension.  Normal readings at home. DD:  05/12/01 TD:  05/13/01 Job: [id number]

## 2011-03-18 NOTE — Discharge Summary (Signed)
[place]  Patient:    [name], [name]                     MRN: [id number] Proc. Date: 05/12/01 Adm. Date:  [id number] Disc. Date: 05/13/01 Attending:  Molpus, Carlisle Beers CC:         Laurita Quint, M.D. [facility]  Molly Maduro D. Arlyce Dice, M.D. [facility]   Discharge Summary  DATE OF BIRTH:  06/19/1946.  DIAGNOSES: 1. Atypical chest pain, myocardial infarction ruled out. 2. Gastroesophageal reflux disease with esophageal stricture. 3. Chest pain, likely due to esophageal spasm.  DIET:  Reflux diet.  DISCHARGE MEDICATIONS: 1. Femhrt. 2. Prilosec 20 mg daily. 3. Librax 2 q.i.d. p.r.n. 4. Baby aspirin one a day with food.  ACTIVITY:  Light.  SPECIAL INSTRUCTIONS:  Call with chest pain.  Hold vitamin C and vitamin E.  PLANNED TEST:  Cardiolite stress test next week.  HISTORY:  The patient presented yesterday with atypical chest pain and was admitted for 24-hour observation.  For details, please address to my history and physical.  On May 13, 2001, she was chest pain free.  All vital signs are stable.  Her sedimentation rate, TSH, and lipid panels are pending.  EKG is normal. DD:  05/13/01 TD:  05/13/01 Job: [id number]

## 2011-03-18 NOTE — Assessment & Plan Note (Signed)
[facility]                         GASTROENTEROLOGY OFFICE NOTE   NAME:[name]                     MRN:          [id number]  DATE:10/18/2006                            DOB:          09/03/1946    PROBLEM:  Nausea and abdominal pain.   [name] is a pleasant 65 year old white female referred through the  courtesy of Dr. Hetty Ely for evaluation.  Approximately a month ago she  developed nausea accompanied by pain in the right upper quadrant that  radiated to her mid epigastrium and through to her back between her  shoulder blades.  Pain was of mild to moderate intensity.  It eventually  subsided.  She was taking Protonix and switched to Prevacid.  She has  had pyrosis which has now resolved.  An abdominal ultrasound  demonstrated a contracted gallbladder with an irregular wall.  CBC and  LFTs, amylase and lipase were all normal.  In the last few weeks she has  had no episodes of pain but does complain of mild queasiness.  She feels  that she has lost some weight.  She is on no gastric irritants including  nonsteroidals.   PAST MEDICAL HISTORY:  Unremarkable.   FAMILY HISTORY:  Pertinent for a father with heart disease.   MEDICATIONS:  Include Prevacid, Vytorin, and folate.  She is allergic to  SULFA.   She neither smokes nor drinks.  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   REVIEW OF SYSTEMS:  Positive for sore throat.   PHYSICAL EXAMINATION:  VITAL SIGNS:  Pulse 76, blood pressure 136/84,  weight 126.  HEENT: EOMI. PERRLA. Sclerae are anicteric.  Conjunctivae are pink.  NECK:  Supple without thyromegaly, adenopathy or carotid bruits.  CHEST:  Clear to auscultation and percussion without adventitious  sounds.  CARDIAC:  Regular rhythm; normal S1 S2.  There are no murmurs, gallops  or rubs.  ABDOMEN:  Bowel sounds are normoactive.  Abdomen is soft, non-tender and  non-distended.  There are no abdominal masses,  tenderness, splenic  enlargement or hepatomegaly.  EXTREMITIES:  Full range of motion.  No cyanosis, clubbing or edema.  RECTAL:  Rectal deferred.   IMPRESSION:  Mild persistent nausea with episodes of abdominal pain.  Symptoms are suggestive of gallbladder disease though that was not  definitely demonstrated by ultrasound.  She probably has at least a  component of gastroesophageal reflux disease.   RECOMMENDATION:  1. Continue Prevacid.  2. Hepatobiliary scan.  3. The patient should have a screening colonoscopy at some point in      the future.   I carefully instructed Ms. Uyeno to contact me if she has any further  episodes of acute pain.     Barbette Hair. Arlyce Dice, MD,FACG  Electronically Signed    RDK/MedQ  DD: 10/18/2006  DT: 10/18/2006  Job #: [id number]   cc:   Arta Silence, MD

## 2011-03-23 ENCOUNTER — Other Ambulatory Visit (INDEPENDENT_AMBULATORY_CARE_PROVIDER_SITE_OTHER): Payer: Self-pay | Admitting: Surgery

## 2011-03-23 DIAGNOSIS — R1031 Right lower quadrant pain: Secondary | ICD-10-CM

## 2011-04-05 ENCOUNTER — Ambulatory Visit
Admission: RE | Admit: 2011-04-05 | Discharge: 2011-04-05 | Disposition: A | Discharge status: Home / Self Care | Payer: Medicare Other | Source: Ambulatory Visit | Attending: Surgery | Admitting: Surgery

## 2011-04-05 DIAGNOSIS — R1031 Right lower quadrant pain: Secondary | ICD-10-CM

## 2011-05-05 ENCOUNTER — Other Ambulatory Visit: Payer: Medicare Other

## 2012-01-03 ENCOUNTER — Other Ambulatory Visit: Payer: Self-pay | Admitting: *Deleted

## 2012-01-03 MED ORDER — LISINOPRIL 5 MG PO TABS: 5.0000 mg | ORAL_TABLET | Freq: Every day | ORAL | Status: DC

## 2012-01-10 ENCOUNTER — Ambulatory Visit (INDEPENDENT_AMBULATORY_CARE_PROVIDER_SITE_OTHER): Payer: Medicare Other | Admitting: Family Medicine

## 2012-01-10 ENCOUNTER — Encounter: Payer: Self-pay | Admitting: Family Medicine

## 2012-01-10 VITALS — BP 124/72 | HR 94 | Temp 98.4°F | Wt 126.8 lb

## 2012-01-10 DIAGNOSIS — R109 Unspecified abdominal pain: Secondary | ICD-10-CM

## 2012-01-10 DIAGNOSIS — R1030 Lower abdominal pain, unspecified: Secondary | ICD-10-CM

## 2012-01-10 MED ORDER — ACETAMINOPHEN-CODEINE #3 300-30 MG PO TABS: 1.0000 | ORAL_TABLET | ORAL | Status: AC | PRN

## 2012-01-10 NOTE — Patient Instructions (Signed)
Take the tylenol #3 as needed and let me know if you have concerns.  I would schedule a physical for the summer with labs ahead of time. Take care.

## 2012-01-10 NOTE — Progress Notes (Signed)
Husband with CVA 11/12.  Now able to swallow and walking some.  He's at SNF now.  There is a possible plan for discharge to home.  Has a single level home.   Years of episodic pain in RLQ.  Was told she may have fibroids.  She had a hysterectomy prev but still had some pain in 2/13.  Had a scan in 6/12 with small R femoral hernia.  Tylenol #3 helped prev with the pain.  She may have 1-2 episodes in a month, or less.  Variable onset.  Better with heating pad.  It can last 2 hours if she doesn't take pain meds.  No FCNAVD.  No blood in stool. Prev with colonoscopy.  Weight loss due to schedule change with husband.  No night sweats, no VB.    Meds, vitals, and allergies reviewed.   ROS: See HPI.  Otherwise, noncontributory.  nad ncat Mmm rrr ctab abd soft, not ttp No hernia palpated in femoral area.  No mass noted

## 2012-01-12 ENCOUNTER — Encounter: Payer: Self-pay | Admitting: Family Medicine

## 2012-01-12 DIAGNOSIS — R109 Unspecified abdominal pain: Secondary | ICD-10-CM | POA: Insufficient documentation

## 2012-01-12 DIAGNOSIS — R1030 Lower abdominal pain, unspecified: Secondary | ICD-10-CM | POA: Insufficient documentation

## 2012-01-12 NOTE — Assessment & Plan Note (Signed)
Along superior/laterl R inguinal canal.  Could be due to hernia or old scar tissue.  Reassuring exam, prev with CT done w/o alarming findings.  No red flag sx by history.  Longstanding sx, will treat episodically.  F/u prn.  She agrees.  Use tylenol #3 prn.

## 2012-02-01 ENCOUNTER — Other Ambulatory Visit: Payer: Self-pay | Admitting: *Deleted

## 2012-02-01 MED ORDER — LANSOPRAZOLE 30 MG PO CPDR: 30.0000 mg | DELAYED_RELEASE_CAPSULE | Freq: Every day | ORAL | Status: DC

## 2012-02-15 ENCOUNTER — Other Ambulatory Visit: Payer: Self-pay

## 2012-02-15 MED ORDER — PRAVASTATIN SODIUM 40 MG PO TABS: 40.0000 mg | ORAL_TABLET | Freq: Every day | ORAL | Status: DC

## 2012-02-15 NOTE — Telephone Encounter (Signed)
[facility] faxed request Pravastatin 40 mg #30 x 11.

## 2012-03-09 ENCOUNTER — Other Ambulatory Visit: Payer: Self-pay | Admitting: Family Medicine

## 2012-03-09 DIAGNOSIS — Z1231 Encounter for screening mammogram for malignant neoplasm of breast: Secondary | ICD-10-CM

## 2012-03-21 ENCOUNTER — Ambulatory Visit
Admission: RE | Admit: 2012-03-21 | Discharge: 2012-03-21 | Disposition: A | Discharge status: Home / Self Care | Payer: Medicare Other | Source: Ambulatory Visit | Attending: Family Medicine | Admitting: Family Medicine

## 2012-03-21 DIAGNOSIS — Z1231 Encounter for screening mammogram for malignant neoplasm of breast: Secondary | ICD-10-CM

## 2012-03-23 ENCOUNTER — Encounter: Payer: Self-pay | Admitting: *Deleted

## 2012-04-02 ENCOUNTER — Encounter: Payer: Self-pay | Admitting: Family Medicine

## 2012-04-02 ENCOUNTER — Ambulatory Visit (INDEPENDENT_AMBULATORY_CARE_PROVIDER_SITE_OTHER): Payer: Medicare Other | Admitting: Family Medicine

## 2012-04-02 VITALS — BP 132/70 | HR 89 | Temp 97.9°F | Wt 129.8 lb

## 2012-04-02 DIAGNOSIS — L0390 Cellulitis, unspecified: Secondary | ICD-10-CM

## 2012-04-02 DIAGNOSIS — L0291 Cutaneous abscess, unspecified: Secondary | ICD-10-CM

## 2012-04-02 MED ORDER — DOXYCYCLINE HYCLATE 100 MG PO TABS: 100.0000 mg | ORAL_TABLET | Freq: Two times a day (BID) | ORAL | Status: AC

## 2012-04-02 NOTE — Assessment & Plan Note (Signed)
Doesn't appear typical for a tick related illness.  Likely a superficial cellulitis.  Would treat with doxy and f/u prn.  Border marked and routine instructions given, re: continue/worsened sx and sun exposure.  She agrees.

## 2012-04-02 NOTE — Progress Notes (Signed)
Several days of L lateral leg lesion.  Mult tick bites, had been working in the yard.  Now with a few days of a red puffy area, L lower leg, inferior to knee, lateral side.  Had expressed some pus prev.  No FCV.  No other rash but had an area near L groin that felt a little puffy w/o erythema.  Able to bear weight w/o intrinsic knee pain.    Meds, vitals, and allergies reviewed.   ROS: See HPI.  Otherwise, noncontributory.  nad ncat Neck supple, no LA rrr ctab L knee with normal ROM and joint line not ttp 3x3.5 cm red puffy area that is minimally ttp but w/u fluctuance inferior and lateral to knee.  Doesn't appear to be related to knee itself.  Central epithelial disruption but no pus expressed.   L prox medial thigh with no redness or lesions noted.

## 2012-04-02 NOTE — Patient Instructions (Signed)
Start the antibiotics today and let us know if you have more knee pain, spreading redness, or fevers.  Take care.

## 2012-04-04 ENCOUNTER — Other Ambulatory Visit: Payer: Self-pay

## 2012-04-04 MED ORDER — LISINOPRIL 5 MG PO TABS: 5.0000 mg | ORAL_TABLET | Freq: Every day | ORAL | Status: DC

## 2012-04-04 NOTE — Telephone Encounter (Signed)
[facility] faxed refill lisinopril 5 mg # 90 x 3.

## 2012-04-19 ENCOUNTER — Other Ambulatory Visit: Payer: Self-pay | Admitting: Family Medicine

## 2012-04-19 DIAGNOSIS — E7800 Pure hypercholesterolemia, unspecified: Secondary | ICD-10-CM

## 2012-04-23 ENCOUNTER — Other Ambulatory Visit (INDEPENDENT_AMBULATORY_CARE_PROVIDER_SITE_OTHER): Payer: Medicare Other

## 2012-04-23 DIAGNOSIS — E7800 Pure hypercholesterolemia, unspecified: Secondary | ICD-10-CM

## 2012-04-23 LAB — LIPID PANEL
Cholesterol: 158 mg/dL (ref 0–200)
HDL: 40.6 mg/dL (ref >39.00)
LDL Cholesterol: 95 mg/dL (ref 0–99)
Total CHOL/HDL Ratio: 4
Triglycerides: 113 mg/dL (ref 0.0–149.0)
VLDL: 22.6 mg/dL (ref 0.0–40.0)

## 2012-04-23 LAB — COMPREHENSIVE METABOLIC PANEL
ALT: 21 U/L (ref 0–35)
AST: 21 U/L (ref 0–37)
Albumin: 3.6 g/dL (ref 3.5–5.2)
Alkaline Phosphatase: 40 U/L (ref 39–117)
BUN: 15 mg/dL (ref 6–23)
CO2: 28 mEq/L (ref 19–32)
Calcium: 8.9 mg/dL (ref 8.4–10.5)
Chloride: 105 mEq/L (ref 96–112)
Creatinine, Ser: 0.8 mg/dL (ref 0.4–1.2)
GFR: 80.45 mL/min (ref >60.00)
Glucose, Bld: 92 mg/dL (ref 70–99)
Potassium: 4.2 mEq/L (ref 3.5–5.1)
Sodium: 139 mEq/L (ref 135–145)
Total Bilirubin: 0.6 mg/dL (ref 0.3–1.2)
Total Protein: 6.3 g/dL (ref 6.0–8.3)

## 2012-04-26 ENCOUNTER — Ambulatory Visit (INDEPENDENT_AMBULATORY_CARE_PROVIDER_SITE_OTHER): Payer: Medicare Other | Admitting: Family Medicine

## 2012-04-26 ENCOUNTER — Encounter: Payer: Self-pay | Admitting: Family Medicine

## 2012-04-26 VITALS — BP 124/72 | HR 76 | Temp 97.9°F | Wt 130.0 lb

## 2012-04-26 DIAGNOSIS — I10 Essential (primary) hypertension: Secondary | ICD-10-CM

## 2012-04-26 DIAGNOSIS — R1030 Lower abdominal pain, unspecified: Secondary | ICD-10-CM

## 2012-04-26 DIAGNOSIS — E785 Hyperlipidemia, unspecified: Secondary | ICD-10-CM

## 2012-04-26 DIAGNOSIS — Z0000 Encounter for general adult medical examination without abnormal findings: Secondary | ICD-10-CM

## 2012-04-26 DIAGNOSIS — R109 Unspecified abdominal pain: Secondary | ICD-10-CM

## 2012-04-26 DIAGNOSIS — K219 Gastro-esophageal reflux disease without esophagitis: Secondary | ICD-10-CM

## 2012-04-26 MED ORDER — LISINOPRIL 5 MG PO TABS: 2.5000 mg | ORAL_TABLET | Freq: Every day | ORAL | Status: DC

## 2012-04-26 MED ORDER — ACETAMINOPHEN-CODEINE #3 300-30 MG PO TABS: 1.0000 | ORAL_TABLET | Freq: Four times a day (QID) | ORAL | Status: DC | PRN

## 2012-04-26 NOTE — Progress Notes (Signed)
I have personally reviewed the Medicare Annual Wellness questionnaire and have noted 1. The patient's medical and social history 2. Their use of alcohol, tobacco or illicit drugs 3. Their current medications and supplements 4. The patient's functional ability including ADL's, fall risks, home safety risks and hearing or visual             impairment. 5. Diet and physical activities 6. Evidence for depression or mood disorders  The patients weight, height, BMI have been recorded in the chart and visual acuity is per eye clinic.  I have made referrals, counseling and provided education to the patient based review of the above and I have provided the pt with a written personalized care plan for preventive services.  See scanned forms.  Routine anticipatory guidance given to patient.  See health maintenance. Tetanus 2010 Flu shot encouarged Shingles 2011 PNA 2011 Colonoscopy 2009 Breast cancer screening- mammogram 5/13 Advance directive discussed- has a HCPOA- her son and a sister.   Consider DXA next year.  Discussed with patient.   GERD with occ upper chest discomfort that isn't exertional. Longstanding, irregular, prev with mammogram wnl.  It may be meal related, but this is isn't clear.  Some foods are troublesome for patient. She can work hard in the yard w/o CP.  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   Groin pain.  Along superior/laterl R inguinal canal. Prev discussed.  Could be due to hernia or old scar tissue. Prev with reassuring exam, prev with CT done w/o alarming findings. No red flag sx by history, no change in sx. Use tylenol #3 prn.   She is in a support group for caregivers.  Caring for husband after CVA.    Elevated Cholesterol: Using medications without problems:yes Muscle aches: no Diet compliance:yes Exercise: yes  Hypertension:    Using medication without problems or lightheadedness: has been lightheaded with current dose.   Chest pain with exertion:no Edema:no Short of breath:no Average  home BPs: had been <100 SBP on home checks  PMH and SH reviewed  Meds, vitals, and allergies reviewed.   ROS: See HPI.  Otherwise negative.    GEN: nad, alert and oriented HEENT: mucous membranes moist NECK: supple w/o LA CV: rrr. PULM: ctab, no inc wob ABD: soft, +bs EXT: no edema SKIN: no acute rash Breast exam: No mass, nodules, thickening, tenderness, bulging, retraction, inflamation, nipple discharge or skin changes noted.  No axillary or clavicular LA.  Chaperoned exam.

## 2012-04-26 NOTE — Assessment & Plan Note (Signed)
Cut back to 2.5 mg of lisinopril and eliminate if still lightheaded.

## 2012-04-26 NOTE — Assessment & Plan Note (Signed)
Continue PPI for now and continue trigger food avoidance.

## 2012-04-26 NOTE — Patient Instructions (Addendum)
Call ahead of your mammogram next year about getting a bone density test.   I would get a flu shot each fall.   Check your pressure out of the clinic after your start the new lisinopril dose.   Let me know if it is elevated or if you are light headed.

## 2012-04-26 NOTE — Assessment & Plan Note (Signed)
Continue prn treatment as is.

## 2012-04-26 NOTE — Assessment & Plan Note (Signed)
Controlled, continue current meds.   

## 2012-06-21 ENCOUNTER — Encounter: Payer: Self-pay | Admitting: Family Medicine

## 2012-06-21 ENCOUNTER — Ambulatory Visit (INDEPENDENT_AMBULATORY_CARE_PROVIDER_SITE_OTHER): Payer: Medicare Other | Admitting: Family Medicine

## 2012-06-21 VITALS — BP 142/80 | HR 73 | Temp 97.8°F | Wt 127.0 lb

## 2012-06-21 DIAGNOSIS — R42 Dizziness and giddiness: Secondary | ICD-10-CM

## 2012-06-21 NOTE — Patient Instructions (Addendum)
Get some meclizine to have on hand and use the bedside exercise for vertigo. Take care.

## 2012-06-21 NOTE — Progress Notes (Signed)
Vertigo episode.  Room was spinning.  2 episodes.  Vomited from the vertigo feeling.  Felt better after vomiting.  BP was normal at the time.  She had prev decreased her BP meds. She slept well last night and feels some better today.  No FCD.  No abd pain.  She took some dramamine.  It made he sleepy, unclear if it helped much.    Meds, vitals, and allergies reviewed.   ROS: See HPI.  Otherwise, noncontributory.  GEN: nad, alert and oriented HEENT: mucous membranes moist, TM/nasal/OP exam wnl NECK: supple w/o LA CV: rrr PULM: ctab, no inc wob ABD: soft, +bs EXT: no edema SKIN: no acute rash CN 2-12 wnl B, S/S/DTR wnl x4 DHP positive.

## 2012-06-21 NOTE — Assessment & Plan Note (Signed)
Likely BPV, continue with otc tx prn (can try antivert).  Path/phys d/w pt, along with bedside exercises. Reassuring exam.  F/u prn.

## 2012-07-03 ENCOUNTER — Other Ambulatory Visit: Payer: Self-pay | Admitting: *Deleted

## 2012-07-03 MED ORDER — LANSOPRAZOLE 30 MG PO CPDR: 30.0000 mg | DELAYED_RELEASE_CAPSULE | Freq: Every day | ORAL | Status: DC

## 2012-07-03 NOTE — Telephone Encounter (Signed)
Received faxed refill request from pharmacy. Refill sent to pharmacy electronically. 

## 2012-07-09 ENCOUNTER — Ambulatory Visit (INDEPENDENT_AMBULATORY_CARE_PROVIDER_SITE_OTHER): Payer: Medicare Other | Admitting: Family Medicine

## 2012-07-09 ENCOUNTER — Encounter: Payer: Self-pay | Admitting: Family Medicine

## 2012-07-09 VITALS — BP 116/70 | HR 92 | Temp 97.9°F | Wt 125.8 lb

## 2012-07-09 DIAGNOSIS — R0789 Other chest pain: Secondary | ICD-10-CM

## 2012-07-09 DIAGNOSIS — R1013 Epigastric pain: Secondary | ICD-10-CM | POA: Insufficient documentation

## 2012-07-09 NOTE — Assessment & Plan Note (Signed)
Overall benign exam w/o alarming sx by history.  EKG unremarkable.  Would use tums more in meantime with HH as presumed cause.  She agrees.  F/u prn.  Nontoxic appearing.

## 2012-07-09 NOTE — Patient Instructions (Signed)
I would keep taking the prevacid and avoid ibuprofen/aleve.  Take tums as needed and notify me if this isn't improved.  Take care.

## 2012-07-09 NOTE — Progress Notes (Signed)
Pain in B upper back, initially on R and then on L upper back.  Occ radiation along the ribs anteriorly.  Also with heartburn.  Going on for about 1 week.  No anterior chest pain now.  Minimal L upper back pain now.  Had been able to work in the yard last week w/o sx.  Intermittent and not exercise induced.  Can happen at rest.  Irregular onset.  She cut back to bland foods last week and she didn't know if that helped.  She'll have occ hot flashes. No fevers, no vomiting, blood in stool.  Not sob.  No cough.  She feels well now, but it was bothering her early this AM.  Known HH on EGD. No tearing feeling in chest, no rash.  Her last 2 fills of prevacid were from a different manufacturer- she didn't know if that was significant.    Meds, vitals, and allergies reviewed.   ROS: See HPI.  Otherwise, noncontributory.  GEN: nad, alert and oriented HEENT: mucous membranes moist NECK: supple w/o LA CV: rrr.  no murmur PULM: ctab, no inc wob ABD: soft, +bs, ttp mildly in epigastric area.  LUQ and RUQ not ttp. No rebound.   EXT: no edema SKIN: no acute rash Back not ttp. Pain not reproduced with stretching her back or rotator cuff testing  Current and old EKG reviewed and w/o sig change from prev.

## 2012-08-10 ENCOUNTER — Ambulatory Visit (INDEPENDENT_AMBULATORY_CARE_PROVIDER_SITE_OTHER): Payer: Medicare Other

## 2012-08-10 DIAGNOSIS — Z23 Encounter for immunization: Secondary | ICD-10-CM

## 2012-10-31 DIAGNOSIS — IMO0001 Reserved for inherently not codable concepts without codable children: Secondary | ICD-10-CM

## 2012-10-31 HISTORY — DX: Reserved for inherently not codable concepts without codable children: IMO0001

## 2013-03-06 ENCOUNTER — Other Ambulatory Visit: Payer: Self-pay | Admitting: Family Medicine

## 2013-03-26 ENCOUNTER — Other Ambulatory Visit: Payer: Self-pay | Admitting: Family Medicine

## 2013-03-27 ENCOUNTER — Other Ambulatory Visit: Payer: Self-pay

## 2013-03-27 DIAGNOSIS — Z1231 Encounter for screening mammogram for malignant neoplasm of breast: Secondary | ICD-10-CM

## 2013-04-16 ENCOUNTER — Telehealth: Payer: Self-pay

## 2013-04-16 DIAGNOSIS — E2839 Other primary ovarian failure: Secondary | ICD-10-CM

## 2013-04-16 NOTE — Telephone Encounter (Signed)
[facility] , they couldn't do them on the same day. Patient will call them when she has her schedule with her and set it up herself.

## 2013-04-16 NOTE — Telephone Encounter (Signed)
Order placed, please see about getting this scheduled.  Thanks.

## 2013-04-16 NOTE — Telephone Encounter (Signed)
Pt is scheduled for mammogram on 04/25/13 at [facility] in [place] and CPX scheduled 05/07/13; pt wants bone density test ordered at same time 04/25/13 as mammogram.Please advise.

## 2013-04-19 ENCOUNTER — Other Ambulatory Visit: Payer: Self-pay | Admitting: Family Medicine

## 2013-04-19 DIAGNOSIS — E7800 Pure hypercholesterolemia, unspecified: Secondary | ICD-10-CM

## 2013-04-25 ENCOUNTER — Ambulatory Visit: Payer: Medicare Other

## 2013-04-30 ENCOUNTER — Other Ambulatory Visit (INDEPENDENT_AMBULATORY_CARE_PROVIDER_SITE_OTHER): Payer: Medicare Other

## 2013-04-30 DIAGNOSIS — E7800 Pure hypercholesterolemia, unspecified: Secondary | ICD-10-CM

## 2013-04-30 DIAGNOSIS — E785 Hyperlipidemia, unspecified: Secondary | ICD-10-CM

## 2013-04-30 DIAGNOSIS — I10 Essential (primary) hypertension: Secondary | ICD-10-CM

## 2013-04-30 DIAGNOSIS — Z0000 Encounter for general adult medical examination without abnormal findings: Secondary | ICD-10-CM

## 2013-04-30 LAB — COMPREHENSIVE METABOLIC PANEL
ALT: 26 U/L (ref 0–35)
AST: 23 U/L (ref 0–37)
Albumin: 3.6 g/dL (ref 3.5–5.2)
Alkaline Phosphatase: 39 U/L (ref 39–117)
BUN: 16 mg/dL (ref 6–23)
CO2: 27 mEq/L (ref 19–32)
Calcium: 8.8 mg/dL (ref 8.4–10.5)
Chloride: 107 mEq/L (ref 96–112)
Creatinine, Ser: 0.6 mg/dL (ref 0.4–1.2)
GFR: 97.8 mL/min (ref >60.00)
Glucose, Bld: 91 mg/dL (ref 70–99)
Potassium: 4.2 mEq/L (ref 3.5–5.1)
Sodium: 139 mEq/L (ref 135–145)
Total Bilirubin: 0.7 mg/dL (ref 0.3–1.2)
Total Protein: 6.3 g/dL (ref 6.0–8.3)

## 2013-04-30 LAB — LIPID PANEL
Cholesterol: 166 mg/dL (ref 0–200)
HDL: 40.9 mg/dL (ref >39.00)
LDL Cholesterol: 99 mg/dL (ref 0–99)
Total CHOL/HDL Ratio: 4
Triglycerides: 131 mg/dL (ref 0.0–149.0)
VLDL: 26.2 mg/dL (ref 0.0–40.0)

## 2013-05-02 ENCOUNTER — Ambulatory Visit
Admission: RE | Admit: 2013-05-02 | Discharge: 2013-05-02 | Disposition: A | Discharge status: Home / Self Care | Payer: Medicare Other | Source: Ambulatory Visit | Attending: Family Medicine | Admitting: Family Medicine

## 2013-05-02 ENCOUNTER — Ambulatory Visit
Admission: RE | Admit: 2013-05-02 | Discharge: 2013-05-02 | Disposition: A | Discharge status: Home / Self Care | Payer: Medicare Other | Source: Ambulatory Visit

## 2013-05-02 DIAGNOSIS — E2839 Other primary ovarian failure: Secondary | ICD-10-CM

## 2013-05-02 DIAGNOSIS — Z1231 Encounter for screening mammogram for malignant neoplasm of breast: Secondary | ICD-10-CM

## 2013-05-05 ENCOUNTER — Encounter: Payer: Self-pay | Admitting: Family Medicine

## 2013-05-05 DIAGNOSIS — M8580 Other specified disorders of bone density and structure, unspecified site: Secondary | ICD-10-CM | POA: Insufficient documentation

## 2013-05-05 DIAGNOSIS — M949 Disorder of cartilage, unspecified: Secondary | ICD-10-CM

## 2013-05-05 DIAGNOSIS — M899 Disorder of bone, unspecified: Secondary | ICD-10-CM | POA: Insufficient documentation

## 2013-05-06 ENCOUNTER — Encounter: Payer: Self-pay | Admitting: *Deleted

## 2013-05-07 ENCOUNTER — Encounter: Payer: Self-pay | Admitting: Family Medicine

## 2013-05-07 ENCOUNTER — Ambulatory Visit (INDEPENDENT_AMBULATORY_CARE_PROVIDER_SITE_OTHER): Payer: Medicare Other | Admitting: Family Medicine

## 2013-05-07 VITALS — BP 134/70 | HR 64 | Temp 97.9°F | Ht 64.5 in | Wt 128.2 lb

## 2013-05-07 DIAGNOSIS — Z0000 Encounter for general adult medical examination without abnormal findings: Secondary | ICD-10-CM

## 2013-05-07 DIAGNOSIS — M8580 Other specified disorders of bone density and structure, unspecified site: Secondary | ICD-10-CM

## 2013-05-07 DIAGNOSIS — R109 Unspecified abdominal pain: Secondary | ICD-10-CM

## 2013-05-07 DIAGNOSIS — R1030 Lower abdominal pain, unspecified: Secondary | ICD-10-CM

## 2013-05-07 DIAGNOSIS — E785 Hyperlipidemia, unspecified: Secondary | ICD-10-CM

## 2013-05-07 DIAGNOSIS — I10 Essential (primary) hypertension: Secondary | ICD-10-CM

## 2013-05-07 DIAGNOSIS — M899 Disorder of bone, unspecified: Secondary | ICD-10-CM

## 2013-05-07 DIAGNOSIS — M949 Disorder of cartilage, unspecified: Secondary | ICD-10-CM

## 2013-05-07 MED ORDER — LANSOPRAZOLE 30 MG PO CPDR: 30.0000 mg | DELAYED_RELEASE_CAPSULE | Freq: Every day | ORAL | Status: DC

## 2013-05-07 MED ORDER — LISINOPRIL 2.5 MG PO TABS: 2.5000 mg | ORAL_TABLET | Freq: Every day | ORAL | Status: DC

## 2013-05-07 MED ORDER — PRAVASTATIN SODIUM 40 MG PO TABS: 40.0000 mg | ORAL_TABLET | Freq: Every day | ORAL | Status: DC

## 2013-05-07 MED ORDER — ACETAMINOPHEN-CODEINE #3 300-30 MG PO TABS: 1.0000 | ORAL_TABLET | Freq: Four times a day (QID) | ORAL | Status: DC | PRN

## 2013-05-07 NOTE — Patient Instructions (Addendum)
Keep exercising.  Don't change your meds.  Take care.  I would get a flu shot each fall.

## 2013-05-07 NOTE — Progress Notes (Signed)
I have personally reviewed the Medicare Annual Wellness questionnaire and have noted 1. The patient's medical and social history 2. Their use of alcohol, tobacco or illicit drugs 3. Their current medications and supplements 4. The patient's functional ability including ADL's, fall risks, home safety risks and hearing or visual             impairment. 5. Diet and physical activities 6. Evidence for depression or mood disorders  The patients weight, height, BMI have been recorded in the chart and visual acuity is per eye clinic.  I have made referrals, counseling and provided education to the patient based review of the above and I have provided the pt with a written personalized care plan for preventive services.  See scanned forms.  Routine anticipatory guidance given to patient.  See health maintenance. Flu 2013 Shingles 2011 PNA 2011 Tetanus 2010 Colonoscopy 2009 Breast cancer screening up to date.  Cognitive function addressed- see scanned forms- and if abnormal then additional documentation follows.    DXA with osteopenia, consider f/u in about 2 years.  Continue exercise and vit D. Discussed with patient.   Hypertension:    Using medication without problems or lightheadedness: yes Chest pain with exertion:no Edema:no Short of breath:no  Elevated Cholesterol: Using medications without problems:yes Muscle aches: no Diet compliance:yes Exercise:yes  Still with rare R inguinal pain, improved after hysterectomy.  Rare flare, occ tylenol #3 use.  Prev imaged, benign findings.  No changes overall.  Needs a refill.   PMH and SH reviewed  Meds, vitals, and allergies reviewed.   ROS: See HPI.  Otherwise negative.    GEN: nad, alert and oriented HEENT: mucous membranes moist NECK: supple w/o LA CV: rrr. PULM: ctab, no inc wob ABD: soft, +bs, R inguinal area not ttp and w/o mass.  EXT: no edema SKIN: no acute rash

## 2013-05-08 NOTE — Assessment & Plan Note (Signed)
Controlled, continue current meds. Labs d/w pt.  

## 2013-05-08 NOTE — Assessment & Plan Note (Signed)
DXA with osteopenia, consider f/u in about 2 years.  Continue exercise and vit D. Discussed with patient.

## 2013-05-08 NOTE — Assessment & Plan Note (Signed)
See scanned forms.  Routine anticipatory guidance given to patient.  See health maintenance. Flu 2013 Shingles 2011 PNA 2011 Tetanus 2010 Colonoscopy 2009 Breast cancer screening up to date.  Cognitive function addressed- see scanned forms- and if abnormal then additional documentation follows.

## 2013-05-08 NOTE — Assessment & Plan Note (Signed)
Continue prn rare tylenol #3.  Benign exam.

## 2013-06-30 ENCOUNTER — Observation Stay: Payer: Self-pay | Admitting: Internal Medicine

## 2013-06-30 LAB — COMPREHENSIVE METABOLIC PANEL
Albumin: 3.4 g/dL (ref 3.4–5.0)
Alkaline Phosphatase: 64 U/L (ref 50–136)
Anion Gap: 6 — ABNORMAL LOW (ref 7–16)
BUN: 14 mg/dL (ref 7–18)
Bilirubin,Total: 0.6 mg/dL (ref 0.2–1.0)
Calcium, Total: 8.7 mg/dL (ref 8.5–10.1)
Chloride: 106 mmol/L (ref 98–107)
Co2: 29 mmol/L (ref 21–32)
Creatinine: 0.83 mg/dL (ref 0.60–1.30)
EGFR (African American): >60
EGFR (Non-African Amer.): >60
Glucose: 99 mg/dL (ref 65–99)
Osmolality: 282 (ref 275–301)
Potassium: 3.5 mmol/L (ref 3.5–5.1)
SGOT(AST): 20 U/L (ref 15–37)
SGPT (ALT): 29 U/L (ref 12–78)
Sodium: 141 mmol/L (ref 136–145)
Total Protein: 6.3 g/dL — ABNORMAL LOW (ref 6.4–8.2)

## 2013-06-30 LAB — CBC
HCT: 40.7 % (ref 35.0–47.0)
HGB: 14 g/dL (ref 12.0–16.0)
MCH: 30.5 pg (ref 26.0–34.0)
MCHC: 34.5 g/dL (ref 32.0–36.0)
MCV: 89 fL (ref 80–100)
Platelet: 115 10*3/uL — ABNORMAL LOW (ref 150–440)
RBC: 4.6 10*6/uL (ref 3.80–5.20)
RDW: 14.6 % — ABNORMAL HIGH (ref 11.5–14.5)
WBC: 5.8 10*3/uL (ref 3.6–11.0)

## 2013-06-30 LAB — LIPASE, BLOOD: Lipase: 141 U/L (ref 73–393)

## 2013-06-30 LAB — CK TOTAL AND CKMB (NOT AT ARMC)
CK, Total: 35 U/L (ref 21–215)
CK, Total: 33 U/L (ref 21–215)
CK-MB: 0.9 ng/mL (ref 0.5–3.6)
CK-MB: 0.7 ng/mL (ref 0.5–3.6)

## 2013-06-30 LAB — TROPONIN I
Troponin-I: <0.02 ng/mL
Troponin-I: <0.02 ng/mL

## 2013-07-01 LAB — COMPREHENSIVE METABOLIC PANEL
Albumin: 3 g/dL — ABNORMAL LOW (ref 3.4–5.0)
Alkaline Phosphatase: 55 U/L (ref 50–136)
Anion Gap: 4 — ABNORMAL LOW (ref 7–16)
BUN: 11 mg/dL (ref 7–18)
Bilirubin,Total: 0.7 mg/dL (ref 0.2–1.0)
Calcium, Total: 8.2 mg/dL — ABNORMAL LOW (ref 8.5–10.1)
Chloride: 110 mmol/L — ABNORMAL HIGH (ref 98–107)
Co2: 27 mmol/L (ref 21–32)
Creatinine: 0.68 mg/dL (ref 0.60–1.30)
EGFR (African American): >60
EGFR (Non-African Amer.): >60
Glucose: 88 mg/dL (ref 65–99)
Osmolality: 280 (ref 275–301)
Potassium: 3.8 mmol/L (ref 3.5–5.1)
SGOT(AST): 19 U/L (ref 15–37)
SGPT (ALT): 24 U/L (ref 12–78)
Sodium: 141 mmol/L (ref 136–145)
Total Protein: 5.5 g/dL — ABNORMAL LOW (ref 6.4–8.2)

## 2013-07-01 LAB — CBC WITH DIFFERENTIAL/PLATELET
Basophil #: 0 10*3/uL (ref 0.0–0.1)
Basophil %: 0.8 %
Eosinophil #: 0.1 10*3/uL (ref 0.0–0.7)
Eosinophil %: 1.3 %
HCT: 37.4 % (ref 35.0–47.0)
HGB: 12.8 g/dL (ref 12.0–16.0)
Lymphocyte #: 2.1 10*3/uL (ref 1.0–3.6)
Lymphocyte %: 33.1 %
MCH: 30.8 pg (ref 26.0–34.0)
MCHC: 34.3 g/dL (ref 32.0–36.0)
MCV: 90 fL (ref 80–100)
Monocyte #: 0.5 x10 3/mm (ref 0.2–0.9)
Monocyte %: 8.6 %
Neutrophil #: 3.6 10*3/uL (ref 1.4–6.5)
Neutrophil %: 56.2 %
Platelet: 120 10*3/uL — ABNORMAL LOW (ref 150–440)
RBC: 4.16 10*6/uL (ref 3.80–5.20)
RDW: 14.5 % (ref 11.5–14.5)
WBC: 6.3 10*3/uL (ref 3.6–11.0)

## 2013-07-01 LAB — LIPID PANEL
Cholesterol: 145 mg/dL (ref 0–200)
HDL Cholesterol: 29 mg/dL — ABNORMAL LOW (ref 40–60)
Ldl Cholesterol, Calc: 71 mg/dL (ref 0–100)
Triglycerides: 224 mg/dL — ABNORMAL HIGH (ref 0–200)
VLDL Cholesterol, Calc: 45 mg/dL — ABNORMAL HIGH (ref 5–40)

## 2013-07-01 LAB — TROPONIN I: Troponin-I: <0.02 ng/mL

## 2013-07-01 LAB — CK TOTAL AND CKMB (NOT AT ARMC)
CK, Total: 24 U/L (ref 21–215)
CK-MB: 0.5 ng/mL (ref 0.5–3.6)

## 2013-07-03 ENCOUNTER — Ambulatory Visit (INDEPENDENT_AMBULATORY_CARE_PROVIDER_SITE_OTHER): Payer: Medicare Other | Admitting: Family Medicine

## 2013-07-03 ENCOUNTER — Encounter: Payer: Self-pay | Admitting: Family Medicine

## 2013-07-03 VITALS — BP 118/68 | HR 68 | Temp 97.5°F | Wt 125.0 lb

## 2013-07-03 DIAGNOSIS — D696 Thrombocytopenia, unspecified: Secondary | ICD-10-CM | POA: Insufficient documentation

## 2013-07-03 DIAGNOSIS — R079 Chest pain, unspecified: Secondary | ICD-10-CM

## 2013-07-03 NOTE — Assessment & Plan Note (Signed)
Recheck CBC later in the month.

## 2013-07-03 NOTE — Progress Notes (Signed)
Was having episodic pain between the shoulder blades, then had anterior chest pain. To ER after she had more pain and L jaw symptoms.  Admitted, H&P and labs reviewed.  Requesting DC summary.  Was admitted for obs this discharged home.  Was advised for outpatient stress test.  No CP in interval, has f/u with [facility] cards tomorrow.  She hasn't had to take NTG in the interval.  She still has some intermittent back pain.  Already on PPI.  No more jaw sx in meantime.  She isn't SOB.  She has been walking at [facility] 6 days out of 7, prehospitalization. She hasn't exerted since discharge from [facility].  She is on a different PPI but hasn't had time to see if that made a difference.  She still has some discomfort intermittently near the L shoulder blade.    She is still caring for her husband.    Low platelets incidentally noted, no bleeding or abnormal bruising.   Meds, vitals, and allergies reviewed.   ROS: See HPI.  Otherwise, noncontributory.  GEN: nad, alert and oriented HEENT: mucous membranes moist NECK: supple w/o LA CV: rrr.  PULM: ctab, no inc wob ABD: soft, +bs EXT: no edema SKIN: no acute rash Back with muscle tightness in the L paraspinal muscles near the inferior portion of the L scapula

## 2013-07-03 NOTE — Assessment & Plan Note (Signed)
Could be all benign, ie GERD/MSK source, but with mult CAD risk factors.  Continue current meds for now and f/u with cards tomorrow.  She agrees.  >25 min spent with face to face with patient, >50% counseling and/or coordinating care.

## 2013-07-03 NOTE — Patient Instructions (Addendum)
Keep the appointment with cards tomorrow.  If you have chest pain then use the NTG.  Recheck CBC in a few weeks since your platelets were low.  Take care.

## 2013-07-28 ENCOUNTER — Encounter: Payer: Self-pay | Admitting: Family Medicine

## 2013-07-29 ENCOUNTER — Other Ambulatory Visit (INDEPENDENT_AMBULATORY_CARE_PROVIDER_SITE_OTHER): Payer: Medicare Other

## 2013-07-29 DIAGNOSIS — D696 Thrombocytopenia, unspecified: Secondary | ICD-10-CM

## 2013-07-29 LAB — CBC WITH DIFFERENTIAL/PLATELET
Basophils Absolute: 0 10*3/uL (ref 0.0–0.1)
Basophils Relative: 0.6 % (ref 0.0–3.0)
Eosinophils Absolute: 0.1 10*3/uL (ref 0.0–0.7)
Eosinophils Relative: 1.5 % (ref 0.0–5.0)
HCT: 40 % (ref 36.0–46.0)
Hemoglobin: 13.5 g/dL (ref 12.0–15.0)
Lymphocytes Relative: 29.2 % (ref 12.0–46.0)
Lymphs Abs: 2 10*3/uL (ref 0.7–4.0)
MCHC: 33.8 g/dL (ref 30.0–36.0)
MCV: 89.7 fl (ref 78.0–100.0)
Monocytes Absolute: 0.5 10*3/uL (ref 0.1–1.0)
Monocytes Relative: 7.7 % (ref 3.0–12.0)
Neutro Abs: 4.1 10*3/uL (ref 1.4–7.7)
Neutrophils Relative %: 61 % (ref 43.0–77.0)
Platelets: 144 10*3/uL — ABNORMAL LOW (ref 150.0–400.0)
RBC: 4.46 Mil/uL (ref 3.87–5.11)
RDW: 14.5 % (ref 11.5–14.6)
WBC: 6.7 10*3/uL (ref 4.5–10.5)

## 2013-09-05 ENCOUNTER — Other Ambulatory Visit: Payer: Self-pay

## 2013-10-01 ENCOUNTER — Ambulatory Visit (INDEPENDENT_AMBULATORY_CARE_PROVIDER_SITE_OTHER): Payer: Medicare Other | Admitting: Family Medicine

## 2013-10-01 ENCOUNTER — Encounter: Payer: Self-pay | Admitting: Family Medicine

## 2013-10-01 VITALS — BP 148/76 | HR 76 | Temp 97.4°F | Wt 125.8 lb

## 2013-10-01 DIAGNOSIS — K219 Gastro-esophageal reflux disease without esophagitis: Secondary | ICD-10-CM

## 2013-10-01 MED ORDER — LANSOPRAZOLE 30 MG PO CPDR: 30.0000 mg | DELAYED_RELEASE_CAPSULE | Freq: Two times a day (BID) | ORAL | Status: DC

## 2013-10-01 NOTE — Progress Notes (Signed)
Pre-visit discussion using our clinic review tool. No additional management support is needed unless otherwise documented below in the visit note.  She was taking ASA for about 1 month but then got a bitter taste in her mouth. She stopped the ASA and it got better, but then it came back. She stopped the vitamins but that didn't help either.  She has some occ R sided abd pain after eating.  She has an atypical feeling in the back of her nasopharynx.  She has a known HH. Still on PPI now.  H/o esophageal dilation in 2002.  No voice changes.  No dysphagia, unless she eats something really dry or flaky.  No vomiting.  No diarrhea.  No fever.  No tongue changes.  Nonsmoker.  No caffeine other than rare chocolate.  Rare ginger ale, that settles her stomach.  Head of bed is elevated.  No blood in stool. Taking prevacid once a day, in AM.  Bitter taste isn't happening at a consistent time, ie not always in the PM or other time.  No clear changes with dietary changes, she may have improved some, temporarily, with activa. Food still tastes normal. No frequent NSAIDs use.    She has occ RUQ pain as above, but this isn't consistent.    Meds, vitals, and allergies reviewed.   ROS: See HPI.  Otherwise, noncontributory.  GEN: nad, alert and oriented HEENT: mucous membranes moist NECK: supple w/o LA CV: rrr PULM: ctab, no inc wob ABD: soft, +bs, not ttp EXT: no edema SKIN: no acute rash

## 2013-10-01 NOTE — Patient Instructions (Signed)
Take the prevacid twice a day and give me an update in about 10-14 days.  If not better, we can set up a GI appointment.

## 2013-10-02 NOTE — Assessment & Plan Note (Signed)
Inc PPI to BID.  If not improved we can refer to GI. She agrees.  She may need to change PPI in 2015 due to coverage.  We can address then if needed.

## 2013-10-21 ENCOUNTER — Telehealth: Payer: Self-pay

## 2013-10-21 NOTE — Telephone Encounter (Signed)
Pt left v/m; pt seen 10/01/13 with reflux problems; pt taking prevacid twice a day and pt wanted Dr Para March to know she is doing a lot better.No cb needed.

## 2013-10-21 NOTE — Telephone Encounter (Signed)
Noted, thanks!

## 2013-11-05 ENCOUNTER — Telehealth: Payer: Self-pay

## 2013-11-05 MED ORDER — OMEPRAZOLE 40 MG PO CPDR: 40.0000 mg | DELAYED_RELEASE_CAPSULE | Freq: Two times a day (BID) | ORAL | Status: DC

## 2013-11-05 NOTE — Telephone Encounter (Signed)
Pt has changed insurance co; Lansoprazole 30 mg will cost pt $125.00 for 90 day rx  and Omeprazole 40 mg cost pt 0 for 90 day rx. Pt request to change to Omeprazole 40 mg taking one tab by mouth twice a day. Pt said Prevacid twice a day has been working but pt prefers 0 copay. Pt request 3 month rx to [facility] and 30 day rx to [facility].Please advise. Pt request cb.

## 2013-11-05 NOTE — Telephone Encounter (Signed)
Rx sent to [facility] and 30 day supply to [facility], [address].

## 2013-11-05 NOTE — Telephone Encounter (Signed)
Okay  To refill as requested.

## 2013-11-08 ENCOUNTER — Telehealth: Payer: Self-pay | Admitting: *Deleted

## 2013-11-08 NOTE — Telephone Encounter (Signed)
Received electronic prior auth request from pharmacy for Omeprazole. Auth forms completed electronically, and sent to insurance company. Pending notification from insurance.

## 2013-11-12 NOTE — Telephone Encounter (Signed)
Prior [name] was denied by insurance. Denial paperwork placed in your inbox to be signed then sent to scan into pt's medical record.

## 2013-11-13 NOTE — Telephone Encounter (Signed)
Pt left vm stating that she now has [employer] and that they aren't covering her Omeprazole because she is taking it BID.  They told her that they have sent a form to our office for Dr. Damita Dunnings to fill out and tell them why she is taking it BID.

## 2013-11-17 NOTE — Telephone Encounter (Signed)
Notify the patient that I haven't gotten the form yet.  I can work on it when it arrives. Thanks.

## 2013-11-19 NOTE — Telephone Encounter (Signed)
Left detailed message on voicemail.  

## 2013-11-19 NOTE — Telephone Encounter (Addendum)
Pt left v/m checking on status of omeprazole. Pt request cb [phone] or [phone]

## 2013-11-20 ENCOUNTER — Telehealth: Payer: Self-pay | Admitting: Family Medicine

## 2013-11-20 NOTE — Telephone Encounter (Signed)
Pt dropped off insurance paperwork for her medication. Pt says that she was told to bring those and to give her new insurance info. I entered her new card into her chart and put her paperwork in Dr. Josefine Class inbox on his desk.

## 2013-11-21 MED ORDER — LISINOPRIL 2.5 MG PO TABS: 2.5000 mg | ORAL_TABLET | Freq: Every day | ORAL | Status: DC

## 2013-11-21 MED ORDER — OMEPRAZOLE 40 MG PO CPDR: 40.0000 mg | DELAYED_RELEASE_CAPSULE | Freq: Two times a day (BID) | ORAL | Status: DC

## 2013-11-21 MED ORDER — PRAVASTATIN SODIUM 40 MG PO TABS: 40.0000 mg | ORAL_TABLET | Freq: Every day | ORAL | Status: DC

## 2013-11-21 NOTE — Telephone Encounter (Signed)
Letter faxed. Patient notified.

## 2013-11-21 NOTE — Telephone Encounter (Signed)
Note from patient.  GERD sx resolved on BID lansoprazole.  Asking for change to BID omeprazole due to cost.  Letter done for patient, rx sent.  Needed refill on pravastatin and lisinpril. rx sent.   Notify pt. Please send the letter in EMR about her PPI. It should have printed.  Thanks.

## 2013-11-21 NOTE — Addendum Note (Signed)
Addended by: Tonia Ghent on: 11/21/2013 12:05 AM   Modules accepted: Orders

## 2013-11-27 ENCOUNTER — Encounter: Payer: Self-pay | Admitting: Family Medicine

## 2013-11-27 ENCOUNTER — Ambulatory Visit (INDEPENDENT_AMBULATORY_CARE_PROVIDER_SITE_OTHER): Payer: Medicare HMO | Admitting: Family Medicine

## 2013-11-27 VITALS — BP 112/64 | HR 81 | Temp 97.3°F | Ht 64.5 in | Wt 127.0 lb

## 2013-11-27 DIAGNOSIS — R350 Frequency of micturition: Secondary | ICD-10-CM | POA: Insufficient documentation

## 2013-11-27 DIAGNOSIS — R300 Dysuria: Secondary | ICD-10-CM

## 2013-11-27 LAB — POCT URINALYSIS DIPSTICK
Bilirubin, UA: NEGATIVE
Blood, UA: NEGATIVE
Glucose, UA: NEGATIVE
Ketones, UA: NEGATIVE
Leukocytes, UA: NEGATIVE
Nitrite, UA: NEGATIVE
Protein, UA: NEGATIVE
Spec Grav, UA: <=1.005
Urobilinogen, UA: 0.2
pH, UA: 6

## 2013-11-27 MED ORDER — NITROFURANTOIN MONOHYD MACRO 100 MG PO CAPS: 100.0000 mg | ORAL_CAPSULE | Freq: Two times a day (BID) | ORAL | Status: DC

## 2013-11-27 NOTE — Progress Notes (Signed)
Subjective:    Patient ID: [name], female    DOB: 1946/09/05, 68 y.o.   MRN: [id number]  HPI Sunday had some frequency and bladder discomfort  Last night - pressure in her bladder area and the constant urge to urinate  A little sting to urinate- very mild   No blood in urine  No bad smell   No caffeine occ artificial sweetners- splenda in cereal -no problem in the past  Drinking a lot of water (usually does not drink a lot)     No nausea or fever  No back pain - but her L hip hurt a bit from walking   She has had a total hysterectomy in the past   Patient Active Problem List   Diagnosis Date Noted  . Dysuria 11/27/2013  . Chest pain 07/03/2013  . Thrombocytopenia, unspecified 07/03/2013  . Osteopenia 05/05/2013  . Epigastric pain 07/09/2012  . Vertigo 06/21/2012  . Medicare annual wellness visit, subsequent 04/26/2012  . GERD (gastroesophageal reflux disease) 04/26/2012  . Groin pain 01/12/2012  . HYPERTENSION 11/17/2010  . CARBOHYDRATE METABOLISM DISORDER 01/31/2008  . HYPERLIPIDEMIA 01/31/2008   Past Medical History  Diagnosis Date  . Hyperlipidemia   . Hypertension   . GERD (gastroesophageal reflux disease)   . Groin pain     Along superior/laterl R inguinal canal.  Could be due to hernia or old scar tissue.  prev with CT done w/o alarming findings.  Will treat episodically.  Use tylenol #3 prn.    . Normal cardiac stress test 2014   Past Surgical History  Procedure Laterality Date  . Esophagogastroduodenoscopy  11/15/07    Normal (Dr. Allyn Kenner)  . Total vaginal hysterectomy  12/30/08    for fibroid pain (Dr. Gertie Fey)  . Facial cosmetic surgery  01/13/10    mini facelift  . Hernia repair     History  Substance Use Topics  . Smoking status: Never Smoker   . Smokeless tobacco: Never Used  . Alcohol Use: No   Family History  Problem Relation Age of Onset  . Transient ischemic attack Mother   . Hypertension Mother   . Stroke Mother     mini  strokes  . Hypertension Sister   . Cancer Sister     ovarian  . Hypertension Sister   . Hypertension Sister   . Hypertension Sister   . Breast cancer Neg Hx   . Colon cancer Neg Hx    Allergies  Allergen Reactions  . Atorvastatin     REACTION: myalgias  . Ciprofloxacin     REACTION: increase reflux  . Epinephrine     Heart racing with dental injection  . Metoprolol Succinate Swelling    Swelling and stiffness in hands and ankles  . Simvastatin Other (See Comments)    Muscle aches and stiffness  . Sulfonamide Derivatives     REACTION: itching   Current Outpatient Prescriptions on File Prior to Visit  Medication Sig Dispense Refill  . acetaminophen-codeine (TYLENOL #3) 300-30 MG per tablet Take 1 tablet by mouth every 6 (six) hours as needed.  30 tablet  0  . Ascorbic Acid (VITAMIN C) 500 MG tablet Take 500 mg by mouth daily.        . Calcium Carbonate (CALCIUM 500 PO) Take by mouth daily.        . Coenzyme Q10 (COQ10) 50 MG CAPS Take 1 capsule by mouth daily.        . fish oil-omega-3 fatty acids  1000 MG capsule Take 2 g by mouth 2 (two) times daily.        . Folic Acid-Vit B2-WUX L24 (FA-VITAMIN B-6-VITAMIN B-12 PO) Take 1 tablet by mouth daily.        Marland Kitchen lisinopril (PRINIVIL,ZESTRIL) 2.5 MG tablet Take 1 tablet (2.5 mg total) by mouth daily.  90 tablet  3  . NITROSTAT 0.4 MG SL tablet Place 0.4 mg under the tongue every 5 (five) minutes as needed.       Marland Kitchen omeprazole (PRILOSEC) 40 MG capsule Take 1 capsule (40 mg total) by mouth 2 (two) times daily.  180 capsule  3  . pravastatin (PRAVACHOL) 40 MG tablet Take 1 tablet (40 mg total) by mouth daily.  90 tablet  3  . [DISCONTINUED] calcium gluconate 500 MG tablet Take 500 mg by mouth daily.        . [DISCONTINUED] CALCIUM-MAG-VIT C-VIT D PO Take by mouth daily.        . [DISCONTINUED] metoprolol succinate (TOPROL-XL) 25 MG 24 hr tablet Take 25 mg by mouth at bedtime.        . [DISCONTINUED] simvastatin (ZOCOR) 40 MG tablet Take 40  mg by mouth at bedtime.         No current facility-administered medications on file prior to visit.    Review of Systems Review of Systems  Constitutional: Negative for fever, appetite change, fatigue and unexpected weight change.  Eyes: Negative for pain and visual disturbance.  Respiratory: Negative for cough and shortness of breath.   Cardiovascular: Negative for cp or palpitations    Gastrointestinal: Negative for nausea, diarrhea and constipation.  Genitourinary: pos  for urgency and frequency.  Skin: Negative for pallor or rash   Neurological: Negative for weakness, light-headedness, numbness and headaches.  Hematological: Negative for adenopathy. Does not bruise/bleed easily.  Psychiatric/Behavioral: Negative for dysphoric mood. The patient is not nervous/anxious.         Objective:   Physical Exam  Constitutional: She appears well-developed and well-nourished. No distress.  HENT:  Head: Normocephalic and atraumatic.  Eyes: Conjunctivae and EOM are normal. Pupils are equal, round, and reactive to light. No scleral icterus.  Neck: Normal range of motion. Neck supple.  Cardiovascular: Normal rate and regular rhythm.   Pulmonary/Chest: Effort normal and breath sounds normal.  Abdominal: Soft. Bowel sounds are normal. She exhibits no distension and no mass. There is tenderness. There is no rebound and no guarding.  Very mild suprapubic tenderness No rebound  No cva tenderness   Neurological: She is alert.  Skin: Skin is warm and dry. No rash noted.  Psychiatric: She has a normal mood and affect.          Assessment & Plan:

## 2013-11-27 NOTE — Progress Notes (Signed)
Pre-visit discussion using our clinic review tool. No additional management support is needed unless otherwise documented below in the visit note.  

## 2013-11-27 NOTE — Patient Instructions (Signed)
Take macrobid for 5 days  Drink water  Avoid other beverages Avoid baths  Clean pelvic area with water only  We sent urine for culture - we will contact you when culture returns

## 2013-11-27 NOTE — Assessment & Plan Note (Signed)
ua looks clear - due to symptoms however will cx urine and cover with macrobid short term until it returns Disc poss of overactive bladder and also urethritis - and disc sympt care  If all is neg -consider oxytrol patch trial (disc side eff)-also given handout on overactive bladder Update if not starting to improve in a week or if worsening

## 2013-11-29 LAB — URINE CULTURE
Colony Count: NO GROWTH
Organism ID, Bacteria: NO GROWTH

## 2013-12-02 NOTE — Telephone Encounter (Signed)
Approval form received by insurance company. Will send to provider for signature, then to be scanned into patients medical record. Pharmacy notified via fax.

## 2014-04-30 ENCOUNTER — Other Ambulatory Visit: Payer: Self-pay | Admitting: Family Medicine

## 2014-04-30 DIAGNOSIS — M8580 Other specified disorders of bone density and structure, unspecified site: Secondary | ICD-10-CM

## 2014-04-30 DIAGNOSIS — E785 Hyperlipidemia, unspecified: Secondary | ICD-10-CM

## 2014-05-01 ENCOUNTER — Other Ambulatory Visit (INDEPENDENT_AMBULATORY_CARE_PROVIDER_SITE_OTHER): Payer: Medicare HMO

## 2014-05-01 DIAGNOSIS — M899 Disorder of bone, unspecified: Secondary | ICD-10-CM

## 2014-05-01 DIAGNOSIS — M8580 Other specified disorders of bone density and structure, unspecified site: Secondary | ICD-10-CM

## 2014-05-01 DIAGNOSIS — E785 Hyperlipidemia, unspecified: Secondary | ICD-10-CM

## 2014-05-01 DIAGNOSIS — D696 Thrombocytopenia, unspecified: Secondary | ICD-10-CM

## 2014-05-01 DIAGNOSIS — E749 Disorder of carbohydrate metabolism, unspecified: Secondary | ICD-10-CM

## 2014-05-01 DIAGNOSIS — I10 Essential (primary) hypertension: Secondary | ICD-10-CM

## 2014-05-01 DIAGNOSIS — Z0000 Encounter for general adult medical examination without abnormal findings: Secondary | ICD-10-CM

## 2014-05-01 DIAGNOSIS — M949 Disorder of cartilage, unspecified: Secondary | ICD-10-CM

## 2014-05-01 LAB — LIPID PANEL
Cholesterol: 170 mg/dL (ref 0–200)
HDL: 38.7 mg/dL — ABNORMAL LOW (ref >39.00)
LDL Cholesterol: 100 mg/dL — ABNORMAL HIGH (ref 0–99)
NonHDL: 131.3
Total CHOL/HDL Ratio: 4
Triglycerides: 156 mg/dL — ABNORMAL HIGH (ref 0.0–149.0)
VLDL: 31.2 mg/dL (ref 0.0–40.0)

## 2014-05-01 LAB — VITAMIN D 25 HYDROXY (VIT D DEFICIENCY, FRACTURES): VITD: 36.59 ng/mL

## 2014-05-01 LAB — COMPREHENSIVE METABOLIC PANEL
ALT: 27 U/L (ref 0–35)
AST: 25 U/L (ref 0–37)
Albumin: 3.9 g/dL (ref 3.5–5.2)
Alkaline Phosphatase: 53 U/L (ref 39–117)
BUN: 15 mg/dL (ref 6–23)
CO2: 28 mEq/L (ref 19–32)
Calcium: 9.1 mg/dL (ref 8.4–10.5)
Chloride: 104 mEq/L (ref 96–112)
Creatinine, Ser: 0.7 mg/dL (ref 0.4–1.2)
GFR: 90.93 mL/min (ref >60.00)
Glucose, Bld: 94 mg/dL (ref 70–99)
Potassium: 3.9 mEq/L (ref 3.5–5.1)
Sodium: 139 mEq/L (ref 135–145)
Total Bilirubin: 0.9 mg/dL (ref 0.2–1.2)
Total Protein: 6.7 g/dL (ref 6.0–8.3)

## 2014-05-06 ENCOUNTER — Other Ambulatory Visit: Payer: Medicare HMO

## 2014-05-09 ENCOUNTER — Other Ambulatory Visit: Payer: Self-pay

## 2014-05-09 DIAGNOSIS — Z1231 Encounter for screening mammogram for malignant neoplasm of breast: Secondary | ICD-10-CM

## 2014-05-13 ENCOUNTER — Encounter: Payer: Self-pay | Admitting: Family Medicine

## 2014-05-13 ENCOUNTER — Ambulatory Visit (INDEPENDENT_AMBULATORY_CARE_PROVIDER_SITE_OTHER): Payer: Medicare HMO | Admitting: Family Medicine

## 2014-05-13 VITALS — BP 130/68 | HR 79 | Temp 98.1°F | Ht 65.0 in | Wt 129.5 lb

## 2014-05-13 DIAGNOSIS — R350 Frequency of micturition: Secondary | ICD-10-CM

## 2014-05-13 DIAGNOSIS — Z7189 Other specified counseling: Secondary | ICD-10-CM

## 2014-05-13 DIAGNOSIS — Z23 Encounter for immunization: Secondary | ICD-10-CM

## 2014-05-13 DIAGNOSIS — E785 Hyperlipidemia, unspecified: Secondary | ICD-10-CM

## 2014-05-13 DIAGNOSIS — Z0000 Encounter for general adult medical examination without abnormal findings: Secondary | ICD-10-CM

## 2014-05-13 DIAGNOSIS — I10 Essential (primary) hypertension: Secondary | ICD-10-CM

## 2014-05-13 MED ORDER — LISINOPRIL 2.5 MG PO TABS: 2.5000 mg | ORAL_TABLET | Freq: Every day | ORAL | Status: DC

## 2014-05-13 MED ORDER — OMEPRAZOLE 40 MG PO CPDR: 40.0000 mg | DELAYED_RELEASE_CAPSULE | Freq: Two times a day (BID) | ORAL | Status: DC

## 2014-05-13 MED ORDER — PRAVASTATIN SODIUM 40 MG PO TABS: 40.0000 mg | ORAL_TABLET | Freq: Every day | ORAL | Status: DC

## 2014-05-13 NOTE — Patient Instructions (Addendum)
Try the Kegel exercises and see if that helps.  If the hot flashes increase, then notify.   Don't change your meds for now.   Keep exercising. Take care. Glad to see you.

## 2014-05-13 NOTE — Progress Notes (Signed)
Pre visit review using our clinic review tool, if applicable. No additional management support is needed unless otherwise documented below in the visit note.  I have personally reviewed the Medicare Annual Wellness questionnaire and have noted 1. The patient's medical and social history 2. Their use of alcohol, tobacco or illicit drugs 3. Their current medications and supplements 4. The patient's functional ability including ADL's, fall risks, home safety risks and hearing or visual             impairment. 5. Diet and physical activities 6. Evidence for depression or mood disorders  The patients weight, height, BMI have been recorded in the chart and visual acuity is per eye clinic.  I have made referrals, counseling and provided education to the patient based review of the above and I have provided the pt with a written personalized care plan for preventive services.  Provider list updated- see scanned forms.  Routine anticipatory guidance given to patient.  See health maintenance.  Flu 2014 Shingles 2011 PNA updated today- 2015 Tetanus 2010 Colonoscopy 2009 Breast cancer screening- mammogram pending.  Advance directive- d/w pt.  Husband designated if patient were incapacitated.   DXA 2014 Cognitive function addressed- see scanned forms- and if abnormal then additional documentation follows.   She has occ urinary frequency w/o leaking.  No burning with urination.  D/w pt.   Hypertension:    Using medication without problems or lightheadedness: yes Chest pain with exertion:no Edema:no Short of breath:no  Elevated Cholesterol: Using medications without problems:yes Muscle aches: no Diet compliance:yes Exercise:yes Labs d/w pt.   PMH and SH reviewed  Meds, vitals, and allergies reviewed.   ROS: See HPI.  Otherwise negative.    GEN: nad, alert and oriented HEENT: mucous membranes moist NECK: supple w/o LA CV: rrr. PULM: ctab, no inc wob ABD: soft, +bs EXT: no  edema SKIN: no acute rash Small mass on L lateral thigh noted.  Present for about a decade per patient, not ttp.  ~2cm across.  No erythema.

## 2014-05-14 DIAGNOSIS — Z7189 Other specified counseling: Secondary | ICD-10-CM | POA: Insufficient documentation

## 2014-05-14 NOTE — Assessment & Plan Note (Signed)
Controlled continue current meds, diet, exercise.  Labs d/w pt.

## 2014-05-14 NOTE — Assessment & Plan Note (Signed)
Flu 2014  Shingles 2011  PNA updated today- 2015  Tetanus 2010  Colonoscopy 2009  Breast cancer screening- mammogram pending.  Advance directive- d/w pt. Husband designated if patient were incapacitated.  DXA 2014  Cognitive function addressed- see scanned forms- and if abnormal then additional documentation follows.

## 2014-05-14 NOTE — Assessment & Plan Note (Signed)
Advance directive- d/w pt. Husband designated if patient were incapacitated.

## 2014-05-14 NOTE — Assessment & Plan Note (Signed)
Could be from OAB vs pelvic floor weakness.  Would have her work on Kegel exercises and then notify us if not improved.  She agrees.  No sx suggestive of UTI.

## 2014-05-20 ENCOUNTER — Ambulatory Visit
Admission: RE | Admit: 2014-05-20 | Discharge: 2014-05-20 | Disposition: A | Discharge status: Home / Self Care | Payer: Medicare HMO | Source: Ambulatory Visit

## 2014-05-20 DIAGNOSIS — Z1231 Encounter for screening mammogram for malignant neoplasm of breast: Secondary | ICD-10-CM

## 2014-05-21 ENCOUNTER — Encounter: Payer: Self-pay | Admitting: *Deleted

## 2014-07-02 DIAGNOSIS — I209 Angina pectoris, unspecified: Secondary | ICD-10-CM | POA: Insufficient documentation

## 2014-08-06 ENCOUNTER — Ambulatory Visit: Payer: Medicare HMO

## 2014-12-29 ENCOUNTER — Ambulatory Visit (INDEPENDENT_AMBULATORY_CARE_PROVIDER_SITE_OTHER): Payer: Commercial Managed Care - HMO | Admitting: Family Medicine

## 2014-12-29 ENCOUNTER — Encounter: Payer: Self-pay | Admitting: Family Medicine

## 2014-12-29 VITALS — BP 128/80 | HR 97 | Temp 98.3°F | Wt 132.0 lb

## 2014-12-29 DIAGNOSIS — R1011 Right upper quadrant pain: Secondary | ICD-10-CM

## 2014-12-29 LAB — CBC WITH DIFFERENTIAL/PLATELET
Basophils Absolute: 0.1 10*3/uL (ref 0.0–0.1)
Basophils Relative: 0.7 % (ref 0.0–3.0)
Eosinophils Absolute: 0.1 10*3/uL (ref 0.0–0.7)
Eosinophils Relative: 1 % (ref 0.0–5.0)
HCT: 39.7 % (ref 36.0–46.0)
Hemoglobin: 13.5 g/dL (ref 12.0–15.0)
Lymphocytes Relative: 28.7 % (ref 12.0–46.0)
Lymphs Abs: 2 10*3/uL (ref 0.7–4.0)
MCHC: 34 g/dL (ref 30.0–36.0)
MCV: 89 fl (ref 78.0–100.0)
Monocytes Absolute: 0.4 10*3/uL (ref 0.1–1.0)
Monocytes Relative: 6.2 % (ref 3.0–12.0)
Neutro Abs: 4.4 10*3/uL (ref 1.4–7.7)
Neutrophils Relative %: 63.4 % (ref 43.0–77.0)
Platelets: 150 10*3/uL (ref 150.0–400.0)
RBC: 4.46 Mil/uL (ref 3.87–5.11)
RDW: 14.3 % (ref 11.5–15.5)
WBC: 7 10*3/uL (ref 4.0–10.5)

## 2014-12-29 LAB — COMPREHENSIVE METABOLIC PANEL
ALT: 22 U/L (ref 0–35)
AST: 23 U/L (ref 0–37)
Albumin: 4 g/dL (ref 3.5–5.2)
Alkaline Phosphatase: 54 U/L (ref 39–117)
BUN: 17 mg/dL (ref 6–23)
CO2: 30 mEq/L (ref 19–32)
Calcium: 9.4 mg/dL (ref 8.4–10.5)
Chloride: 103 mEq/L (ref 96–112)
Creatinine, Ser: 0.73 mg/dL (ref 0.40–1.20)
GFR: 83.62 mL/min (ref >60.00)
Glucose, Bld: 140 mg/dL — ABNORMAL HIGH (ref 70–99)
Potassium: 4.3 mEq/L (ref 3.5–5.1)
Sodium: 136 mEq/L (ref 135–145)
Total Bilirubin: 0.5 mg/dL (ref 0.2–1.2)
Total Protein: 6.5 g/dL (ref 6.0–8.3)

## 2014-12-29 LAB — LIPASE: Lipase: 55 U/L (ref 11.0–59.0)

## 2014-12-29 NOTE — Assessment & Plan Note (Signed)
Likely GB source, but nontoxic.  D/w pt about anatomy/path/phys.  Avoid fatty foods, check u/s, check labs today.  May need HIDA depending on u/s, d/w pt.  If inc in pain, to ER.  She agrees.  >25 minutes spent in face to face time with patient, >50% spent in counselling or coordination of care.

## 2014-12-29 NOTE — Progress Notes (Signed)
Pre visit review using our clinic review tool, if applicable. No additional management support is needed unless otherwise documented below in the visit note.  After eating supper she has had RUQ pain.  She can still occ have pain in between the shoulder blades.  Pain in the RUQ with a sneeze recently.  Some AM abd bloating, can occ happen later in the day.  Usually the RUQ pain isn't severe but it is bothersome.  She has noted more gas recently.  No vomiting, no diarrhea but occ loose stools.  No blood in stool except for once last week likely related to a hemorrhoid.  No blood in the interval.  No fevers.  Rare nausea.  She did have sx recently after greasy foods.    She has some hot flashes that may or may not be related to the above.    She has an episodic cough, possibly GERD related.    Meds, vitals, and allergies reviewed.   ROS: See HPI.  Otherwise, noncontributory.  GEN: nad, alert and oriented HEENT: mucous membranes moist NECK: supple w/o LA CV: rrr.  PULM: ctab, no inc wob ABD: RUQ slightly ttp, no rebound, normal BS, abd not ttp o/w.  abd soft.  EXT: no edema

## 2014-12-29 NOTE — Patient Instructions (Signed)
Go to the lab on the way out.  We'll contact you with your lab report. Rosaria Ferries will call about your referral. Avoid fatty meals in the meantime.  If you suddenly have more pain, then go to the ER.  Take care.

## 2014-12-30 ENCOUNTER — Encounter: Payer: Self-pay | Admitting: Family Medicine

## 2014-12-30 ENCOUNTER — Other Ambulatory Visit: Payer: Self-pay | Admitting: Family Medicine

## 2014-12-30 ENCOUNTER — Ambulatory Visit: Payer: Self-pay | Admitting: Family Medicine

## 2014-12-30 DIAGNOSIS — R1011 Right upper quadrant pain: Secondary | ICD-10-CM

## 2015-01-02 ENCOUNTER — Ambulatory Visit: Payer: Self-pay | Admitting: Family Medicine

## 2015-01-05 ENCOUNTER — Encounter: Payer: Self-pay | Admitting: Family Medicine

## 2015-01-06 ENCOUNTER — Other Ambulatory Visit: Payer: Self-pay | Admitting: Family Medicine

## 2015-01-06 DIAGNOSIS — R1011 Right upper quadrant pain: Secondary | ICD-10-CM

## 2015-01-13 ENCOUNTER — Encounter: Payer: Self-pay | Admitting: General Surgery

## 2015-01-22 ENCOUNTER — Encounter: Payer: Self-pay | Admitting: General Surgery

## 2015-01-22 ENCOUNTER — Ambulatory Visit (INDEPENDENT_AMBULATORY_CARE_PROVIDER_SITE_OTHER): Payer: Commercial Managed Care - HMO | Admitting: General Surgery

## 2015-01-22 VITALS — BP 142/72 | HR 82 | Resp 14 | Ht 65.0 in | Wt 131.0 lb

## 2015-01-22 DIAGNOSIS — R1011 Right upper quadrant pain: Secondary | ICD-10-CM | POA: Diagnosis not present

## 2015-01-22 NOTE — Patient Instructions (Addendum)
The patient is aware to call back for any questions or concerns. Follow up as needed. Call back if you decide on a CT scan.

## 2015-01-22 NOTE — Progress Notes (Signed)
Patient ID: [name], female   DOB: Sep 25, 1946, 69 y.o.   MRN: [id number]  Chief Complaint  Patient presents with  . Abdominal Pain    HPI [name] is a 69 y.o. female.  Here today to evaluate right upper abdomen pain. She first noticed the pain around mid February that radiated to her shoulder blade. However, around Christmas she had noticed a bloating fullness in the right upper abdomen. No vomiting. The pain occurs about 1 hour after she eats. Bloating is usually after breakfast. The pain comes and goes and is more like "pressure". She states she fells like "something is moving".  She does walk at the mall and does not notice anything different. Appetite is unchanged. She had a ultrasound 12-30-14 and HIDA 01-02-15. She did have loose watery stools after the HIDA scan. Bowels are regular and move about 2-3 times a day.  The patient had lower abdominal pain prior to her TAH/BSO. This was at the same time that her sister was diagnosed with ovarian cancer. She subsequently reports that the abdominal pain past 2 years after her GYN surgery.     HPI  Past Medical History  Diagnosis Date  . Hyperlipidemia   . Hypertension   . GERD (gastroesophageal reflux disease)   . Groin pain     Along superior/laterl R inguinal canal.  Could be due to hernia or old scar tissue.  prev with CT done w/o alarming findings.  Will treat episodically.  Use tylenol #3 prn.    . Normal cardiac stress test 2014    Past Surgical History  Procedure Laterality Date  . Esophagogastroduodenoscopy  2002, 11/15/07    Normal (Dr. Allyn Kenner)  . Total vaginal hysterectomy  12/30/08    for fibroid pain (Dr. Gertie Fey)  . Facial cosmetic surgery  01/13/10    mini facelift  . Breast mass excision  1977    benign  . Hernia repair  1976  . Carpal tunnel release Right 1978  . Dilation and curettage of uterus  2001  . Colonoscopy  2009  . Abdominal hysterectomy  12-30-08    Family History  Problem Relation Age of  Onset  . Transient ischemic attack Mother   . Hypertension Mother   . Stroke Mother     mini strokes  . Hypertension Sister   . Cancer Sister     ovarian  . Hypertension Sister   . Hypertension Sister   . Hypertension Sister   . Breast cancer Neg Hx   . Colon cancer Neg Hx     Social History History  Substance Use Topics  . Smoking status: Never Smoker   . Smokeless tobacco: Never Used  . Alcohol Use: No    Allergies  Allergen Reactions  . Atorvastatin     REACTION: myalgias  . Ciprofloxacin     REACTION: increase reflux  . Epinephrine     Heart racing with dental injection  . Metoprolol Succinate Swelling    Swelling and stiffness in hands and ankles  . Simvastatin Other (See Comments)    Muscle aches and stiffness  . Sulfonamide Derivatives     REACTION: itching    Current Outpatient Prescriptions  Medication Sig Dispense Refill  . Ascorbic Acid (VITAMIN C) 500 MG tablet Take 500 mg by mouth daily.      . Biotin (PA BIOTIN) 1000 MCG tablet Take 1,000 mcg by mouth daily.    . Calcium Carbonate (CALCIUM 500 PO) Take by mouth daily.      Marland Kitchen  Cholecalciferol (VITAMIN D-3) 1000 UNITS CAPS Take 1 capsule by mouth daily.    . Coenzyme Q10 (COQ10) 50 MG CAPS Take 1 capsule by mouth daily.      . fish oil-omega-3 fatty acids 1000 MG capsule Take 2 g by mouth daily.     . Folic Acid-Vit Z3-GUY Q03 (FA-VITAMIN B-6-VITAMIN B-12 PO) Take 1 tablet by mouth daily.      Marland Kitchen lisinopril (PRINIVIL,ZESTRIL) 2.5 MG tablet Take 1 tablet (2.5 mg total) by mouth daily. 90 tablet 3  . NITROSTAT 0.4 MG SL tablet Place 0.4 mg under the tongue every 5 (five) minutes as needed.     Marland Kitchen omeprazole (PRILOSEC) 40 MG capsule Take 1 capsule (40 mg total) by mouth 2 (two) times daily. 180 capsule 3  . potassium chloride (KLOR-CON) 8 MEQ tablet Take 8 mEq by mouth daily.    . pravastatin (PRAVACHOL) 40 MG tablet Take 1 tablet (40 mg total) by mouth daily. 90 tablet 3  . [DISCONTINUED] calcium gluconate  500 MG tablet Take 500 mg by mouth daily.      . [DISCONTINUED] CALCIUM-MAG-VIT C-VIT D PO Take by mouth daily.      . [DISCONTINUED] metoprolol succinate (TOPROL-XL) 25 MG 24 hr tablet Take 25 mg by mouth at bedtime.      . [DISCONTINUED] simvastatin (ZOCOR) 40 MG tablet Take 40 mg by mouth at bedtime.       No current facility-administered medications for this visit.    Review of Systems Review of Systems  Constitutional: Negative.   Cardiovascular: Negative.   Gastrointestinal: Positive for abdominal pain and abdominal distention. Negative for nausea, vomiting, diarrhea and constipation.    Blood pressure 142/72, pulse 82, resp. rate 14, height 5\' 5"  (1.651 m), weight 131 lb (59.421 kg).  Physical Exam Physical Exam  Constitutional: She is oriented to person, place, and time. She appears well-developed and well-nourished.  Neck: Neck supple.  Cardiovascular: Normal rate, regular rhythm and normal heart sounds.   Pulmonary/Chest: Effort normal and breath sounds normal.  Abdominal: Soft. Normal appearance and bowel sounds are normal. There is no tenderness.    She points to lateral lower rib cage when asked about the sensation she experiences.  Lymphadenopathy:    She has no cervical adenopathy.  Neurological: She is alert and oriented to person, place, and time.  Skin: Skin is warm and dry.    Data Reviewed Abdominal ultrasound dated 12/30/2014 was reviewed. No evidence of cholelithiasis or liver pathology.  HIDA scan with CCK dated arch 4, 2016 showed a normal ejection fraction. The patient did experience post injection diarrhea 1. Diarrhea has not been a component of her present abdominal complaints. No upper abdominal pain reported during the CCK infusion.  Assessment    Right upper quadrant discomfort, unclear etiology.  No evidence of biliary tract source for pain.    Plan    The fact that she had experienced a long history of lower abdominal pain unrelieved by  hysterectomy/ovary removal with spontaneous resolution 2 years post surgery is in part reassuring that episodic discomfort is not necessarily related to malignancy.  The only 2 options for assessment for my point of view would be a CT scan of the abdomen to look for a secondary source of pain from the lower ribs or chest wall, less likely the pancreas as well as diagnostic laparoscopy. I do not believe her symptoms warrant consideration of the latter at this time.  History does not suggest a primary thoracic  source for her pain, and for that reason imaging of the thoracic spine is not felt appropriate.  The patient will consider if she does or does not want to proceed with CT scanning and notify the office of her desires. Follow up otherwise would be on an as-needed basis.       PCP/Ref:  Leafy Kindle 01/23/2015, 9:41 AM

## 2015-01-23 DIAGNOSIS — R1011 Right upper quadrant pain: Secondary | ICD-10-CM | POA: Insufficient documentation

## 2015-01-26 ENCOUNTER — Telehealth: Payer: Self-pay | Admitting: *Deleted

## 2015-01-26 NOTE — Telephone Encounter (Signed)
-----   Message from Robert Bellow, MD sent at 01/26/2015 10:39 AM EDT ----- Please arrange for a CT of the abdomen with oral and IV contrast regarding right upper quadrant pain with a post scan appointment to follow. Thank you

## 2015-01-26 NOTE — Telephone Encounter (Signed)
Patient has been scheduled for a CT abdomen with contrast at [facility] for 01-30-15 at 8 am (arrive 7:45 am). Prep: no solids 4 hours prior but patient may have clear liquids up until exam time, pick up prep kit, and take medication list. Patient verbalizes understanding.  This patient will follow up in the office next week once scan completed.

## 2015-01-30 ENCOUNTER — Encounter: Payer: Self-pay | Admitting: General Surgery

## 2015-01-30 ENCOUNTER — Telehealth: Payer: Self-pay | Admitting: General Surgery

## 2015-01-30 ENCOUNTER — Ambulatory Visit
Admit: 2015-01-30 | Disposition: A | Discharge status: Home / Self Care | Payer: Self-pay | Attending: General Surgery | Admitting: General Surgery

## 2015-01-30 NOTE — Telephone Encounter (Signed)
The patient had a CT scan of the abdomen to complete her evaluation for unexplained postprandial abdominal pain. No pathologic findings were identified. An incidental finding was a partially visualized 4 mm right middle lobe nodule for which a consideration of additional imaging was suggested. In a nonsmoker, is highly unlikely that this is malignancy. If I were going to do any evaluation at all in regards to the 4 mm lung nodule I would do a noncontrast chest CT in October 2016, essentially a 6 month follow-up exam.  The patient is scheduled to meet with her primary care physician in July 2016, and I would encourage her to discuss with him more fully the pros and cons of additional CT scanning.  It's been 7 years since her last endoscopy exams, and while I think it's unlikely that these would identify a source for her pain, a consideration can be given to repeat endoscopic exam especially of the upper GI tract. She had seen Richmond Campbell, M.D. in the past and this would certainly be a very reasonable physician to have evaluated her symptoms.

## 2015-02-01 ENCOUNTER — Telehealth: Payer: Self-pay | Admitting: Family Medicine

## 2015-02-01 ENCOUNTER — Encounter: Payer: Self-pay | Admitting: Family Medicine

## 2015-02-01 DIAGNOSIS — R911 Solitary pulmonary nodule: Secondary | ICD-10-CM | POA: Insufficient documentation

## 2015-02-01 NOTE — Telephone Encounter (Signed)
Please call pt.  CT with incidentally noted pulmonary nodule.  This was small and not alarming.  It would be reasonable to do a noncontrast chest CT in October 2016.   We can talk about pros and cons of additional CT scanning at [facility], please schedule if she wants to come in.  O/w we can talk at her physical this summer.   If she continues to have sx o/w it would be reasonable to see Richmond Campbell, M.D. Thanks.

## 2015-02-02 NOTE — Telephone Encounter (Signed)
Patient advised.   Patient said Dr. Bary Castilla had phoned her also and explained it to her so she is fine with this and will follow up for CPE in July.  She states if it becomes more bothersome, she will come in for an OV.

## 2015-02-02 NOTE — Telephone Encounter (Signed)
Noted, thanks!

## 2015-02-03 ENCOUNTER — Ambulatory Visit: Payer: Commercial Managed Care - HMO | Admitting: General Surgery

## 2015-02-20 NOTE — Consult Note (Signed)
PATIENT NAME:  [name], [name] MR#:  [id number] DATE OF BIRTH:  [birth year]  DATE OF CONSULTATION:  07/01/2013  REFERRING PHYSICIAN:   CONSULTING PHYSICIAN:  Isaias Cowman, MD  PRIMARY CARE PHYSICIAN: [place]  CHIEF COMPLAINT: Chest pain.   HISTORY OF PRESENT ILLNESS: The patient is a 69 year old female referred for evaluation of chest pain. The patient has a history of hypertension and hyperlipidemia. She also has a history of gastroesophageal reflux disease. She reports a 2 week history of intermittent mid back discomfort which radiated to her left shoulder and jaw. These symptoms appear to be unrelated to exertion. On the night prior to admission, the patient reported a more severe episode. She presented to [facility] Room where EKG was nondiagnostic. The patient ruled out for myocardial infarction by CPK isoenzymes and troponin. The patient currently is symptom free.   PAST MEDICAL HISTORY: 1.  Hypertension.  2.  Hyperlipidemia.  3.  Gastroesophageal reflux disease.   MEDICATIONS: 1.  Lisinopril 2.5 mg daily.  2.  Pravastatin 40 mg at bedtime.   SOCIAL HISTORY: The patient is married. Her husband had a stroke and requires her care.   FAMILY HISTORY: Positive for coronary artery disease.   REVIEW OF SYSTEMS: CONSTITUTIONAL: No fever or chills.  EYES: No blurry vision.  EARS: No hearing loss.  RESPIRATORY: No shortness of breath.  CARDIOVASCULAR: Mid back pain as described above.  GASTROINTESTINAL: Gastroesophageal reflux disease.  GENITOURINARY: No dysuria or hematuria.  ENDOCRINE: No polyuria or polydipsia.  MUSCULOSKELETAL: No arthralgias or myalgias.  NEUROLOGICAL: No focal muscle weakness or numbness.  PSYCHOLOGICAL: No depression or anxiety.   PHYSICAL EXAMINATION: VITAL SIGNS: Blood pressure 96/60, pulse 72, respirations 16, temperature 97.6, pulse ox 97%.  HEENT: Pupils equal and reactive to light and accommodation.  NECK: Supple without  thyromegaly.  LUNGS: Clear.  HEART: Normal JVP. Normal PMI. Regular rate and rhythm. Normal S1 and S2. No appreciable gallop, murmur or rub.  ABDOMEN: Soft and nontender. Pulses were intact bilaterally.  MUSCULOSKELETAL: Normal muscle tone.  NEUROLOGIC: The patient is alert and oriented x 3. Motor and sensory are both grossly intact.   IMPRESSION: A 69 year old female who presents with chest pain with typical and atypical features with known gastroesophageal reflux disease. The patient has ruled out for myocardial infarction by CPK isoenzymes and troponin. The patient currently is chest pain free.   RECOMMENDATIONS: 1.  Agree with current therapy.  2.  Would defer full dose anticoagulation.  3.  In light of negative troponin and resolution of chest pain, the patient could be discharged home to be seen in follow-up for outpatient further cardiac testing. ____________________________ Isaias Cowman, MD ap:sb D: 07/01/2013 09:21:12 ET T: 07/01/2013 11:39:07 ET JOB#: [id number]  cc: Isaias Cowman, MD, <Dictator> Isaias Cowman MD ELECTRONICALLY SIGNED 07/11/2013 7:59

## 2015-02-20 NOTE — Consult Note (Signed)
Brief Consult Note: Diagnosis: CP, neg troponin.   Patient was seen by consultant.   Consult note dictated.   Comments: REC  Agree with current therapy, defer full dose anticoagulation, functional study, may be done as out-patient.  Electronic Signatures: Isaias Cowman (MD)  (Signed 01-Sep-14 09:30)  Authored: Brief Consult Note   Last Updated: 01-Sep-14 09:30 by Isaias Cowman (MD)

## 2015-02-20 NOTE — Discharge Summary (Signed)
PATIENT NAME:  [name], [name] MR#:  [id number] DATE OF BIRTH:  01/17/1946  DATE OF ADMISSION:  06/30/2013 DATE OF DISCHARGE:  07/01/2013  ADMISSION DIAGNOSIS:   Chest pain.    DISCHARGE DIAGNOSES 1.  Chest pain, unclear etiology at this time. Cardiac enzymes were negative.  2.  Hypertension.  3.  Hyperlipidemia.  4.  Gastroesophageal reflux disease.   DISCHARGE MEDICATIONS:  The patient is to continue her outpatient medications which are lisinopril 5 mg p.o. 1/2 tablet once daily, Pravachol 40 mg daily at bedtime, nitroglycerin 0.4 mg sublingually every 5 minutes as needed, aspirin 81 mg p.o. daily, advanced dose of omeprazole 40 mg p.o. once daily.   HOME OXYGEN: None.   DIET: Two grams salt, low fat, low cholesterol, regular consistency.   ACTIVITY LIMITATIONS: As tolerated.   FOLLOWUP APPOINTMENT: With Dr. Franchot Mimes in 2 days after discharge as well as Dr. Fletcher Anon or Dr. Rockey Situ in 1 to 2 days after discharge.   CONSULTANTS: Dr. Saralyn Pilar, care management.   RADIOLOGIC STUDIES: Chest x-ray, PA and lateral, 31st of August 2014, revealed no evidence of pneumonia or CHF.    HISTORY OF PRESENT ILLNESS:  The patient is a 69 year old Caucasian female with past medical history significant for history of hypertension and hyperlipidemia who presented to the hospital with complaints of chest pains. Please refer to Dr. Doy Hutching' admission note on the 31st of August 2014. Apparently, the patient has been having a 2-week history of intermittent chest pains radiating to her left axilla, left jaw and throughout her back associated with diaphoresis but no shortness of breath or nausea. Symptoms occur both at rest as well as on exertion and temporarily improved with nitroglycerin in the Emergency. Room on arrival to the hospital, the patient was afebrile, her pulse was 77, respiratory rate was 20, blood pressure 113/63. Physical exam was unremarkable.    The patient's EKG showed normal sinus rhythm at 72 beats  per minute, no acute ST-T changes were noted. Chest x-ray was normal. The patient's lab data done in the Emergency Room showed normal BMP, normal liver enzymes, normal first set of cardiac enzymes as well as subsequent 2 more sets and normal CBC with low platelet count 115.   HOSPITAL COURSE:  The patient was admitted to the hospital for further evaluation. Her cardiac enzymes were cycled. The patient had cardiology evaluation due to her chest pains. Dr. Saralyn Pilar, who saw patient in consultation, felt the patient has chest pain as well as negative troponins.  He recommended to defer full-dose anticoagulation and get functional study done as an outpatient. For this reason, the patient was initiated on aspirin therapy as well as given nitroglycerin to take sublingually if needed.  Her proton pump inhibitor was also advanced to 40 mg once daily dose and the patient was recommended to follow up with cardiologist in the next few days after discharge. On day of discharge, temperature is 98.3, pulse was 59, respiratory rate was 18, blood pressure ranging from 96 to 482 systolic and 70B diastolic. O2 sats were 95% to 97% on room air at rest.   TIME SPENT: 40 minutes.    ____________________________ Theodoro Grist, MD rv:cs D: 07/01/2013 17:12:00 ET T: 07/01/2013 18:10:02 ET JOB#: [id number]  cc: Theodoro Grist, MD, <Dictator> Muhammad A. Fletcher Anon, MD Minna Merritts, MD Wallace Cullens. Franchot Mimes, MD Theodoro Grist MD ELECTRONICALLY SIGNED 07/20/2013 18:13

## 2015-02-20 NOTE — H&P (Signed)
PATIENT NAME:  [name], [name] MR#:  [id number] DATE OF BIRTH:  1946/03/02  DATE OF ADMISSION:  06/30/2013  REFERRING PHYSICIAN: Hinda Kehr, MD  REASON FOR ADMISSION: Chest pain, worrisome for unstable angina.   HISTORY OF PRESENT ILLNESS: The patient is a 69 year old female with a significant history of hypertension, hyperlipidemia and a family history of coronary artery disease, who presents with a 2-week history of intermittent chest pain radiating to the left axilla, left jaw and through to the back, associated with diaphoresis. No shortness of breath or nausea. Symptoms occur both with exertion and at rest. Had a severe episode last night, took Motrin with no improvement. Presented to the Emergency Room where her jaw pain was temporarily improved with nitroglycerin but then returned. She is now admitted for further evaluation.   PAST MEDICAL HISTORY 1.  Hyperlipidemia.  2.  Benign hypertension.  3.  GE reflux disease.  4.  Family history of coronary artery disease.  5.  Status post hysterectomy.   MEDICATIONS 1.  Lisinopril 2.5 mg p.o. daily.  2.  Prevacid 30 mg p.o. daily.  3.  Pravastatin 40 mg p.o. at bedtime.   ALLERGIES: SULFA.   SOCIAL HISTORY: The patient is married. No history of alcohol or tobacco abuse.   FAMILY HISTORY: Positive for coronary artery disease, stroke, diabetes, ovarian cancer.   REVIEW OF SYSTEMS CONSTITUTIONAL: No fever or change in weight.  EYES: No blurred or double vision. No glaucoma.  EARS, NOSE AND THROAT:  No tinnitus or hearing loss. No nasal discharge or bleeding. No difficulty swallowing.  RESPIRATORY: No cough or wheezing. Denies hemoptysis or painful respirations.  CARDIOVASCULAR: No orthopnea or palpitations. No syncope.  GASTROINTESTINAL: No vomiting or diarrhea. No change in bowel habits.  GENITOURINARY: No dysuria or hematuria. No incontinence.  ENDOCRINE: No polyuria or polydipsia. No heat or cold intolerance.  HEMATOLOGIC: The  patient denies anemia, easy bruising or bleeding.  LYMPHATIC: No swollen glands.  MUSCULOSKELETAL: The patient denies pain in her neck, shoulders, knees or hips. Some mid back pain between her shoulder blades. No gout.  NEUROLOGIC: No numbness or weakness. Denies migraines, stroke or seizures.  PSYCHIATRIC: The patient denies anxiety, insomnia or depression.   PHYSICAL EXAMINATION GENERAL: The patient is in no acute distress.  VITAL SIGNS: Currently remarkable for blood pressure of 113/63, heart rate of 77 and a respiratory rate of 20. She is afebrile.  HEENT: Normocephalic, atraumatic. Pupils equally round and reactive to light and accommodation. Extraocular movements are intact. Sclerae anicteric. Conjunctivae are clear. Oropharynx is clear.   NECK: Supple without JVD or bruits. No adenopathy or thyromegaly is noted.  LUNGS: Clear to auscultation and percussion without wheezes, rales or rhonchi. No dullness. Respiratory effort is normal.  CARDIAC: Regular rate and rhythm. Normal S1, S2. No significant rubs, murmurs or gallops. PMI is nondisplaced. Chest wall is nontender.  ABDOMEN: Soft, nontender, with normoactive bowel sounds. No organomegaly or masses were appreciated. No hernias or bruits were noted.  EXTREMITIES: Without clubbing, cyanosis or edema. Pulses were 2+ bilaterally.  SKIN: Warm and dry without rash or lesions.  NEUROLOGIC: Cranial nerves II through XII grossly intact. Deep tendon reflexes were symmetric. Motor and sensory exams nonfocal.  PSYCHIATRIC: The patient was alert and oriented to person, place and time. She was cooperative and used good judgment.   LABORATORY DATA: EKG revealed sinus rhythm at 72 beats per minute with no acute ischemic changes. Chest x-ray was unremarkable. Her white count was 5.8 with a hemoglobin  of 14.0. Glucose 99 with a BUN of 14, creatinine 0.83 and a sodium of 141 with a potassium of 3.5. GFR greater than 60. Lipase 141. Troponin was less than  0.02. Total CK was 35 with an MB of 0.9.   ASSESSMENT 1,  Chest pain worrisome for unstable angina.  2.  Benign hypertension.  3.  Hyperlipidemia.  4.  History of coronary artery disease.  5.  Mild thrombocytopenia.   PLAN: The patient will be observed on telemetry with topical nitrates, Lovenox and aspirin. We will follow serial cardiac enzymes and obtain a cardiology consult given her symptoms at rest. We will follow up labs in the morning for her chemistries and thrombocytopenia. Further treatment and evaluation will depend upon the patient's progress.   TOTAL TIME SPENT ON THIS PATIENT: 50 minutes.   ____________________________ Leonie Douglas Doy Hutching, MD jds:cs D: 06/30/2013 12:15:18 ET T: 06/30/2013 15:19:14 ET JOB#: [id number]  cc: Leonie Douglas. Doy Hutching, MD, <Dictator> Modesto Charon, MD Lev Cervone Lennice Sites MD ELECTRONICALLY SIGNED 06/30/2013 16:53

## 2015-03-17 ENCOUNTER — Ambulatory Visit (INDEPENDENT_AMBULATORY_CARE_PROVIDER_SITE_OTHER): Payer: Commercial Managed Care - HMO | Admitting: Family Medicine

## 2015-03-17 ENCOUNTER — Encounter: Payer: Self-pay | Admitting: Family Medicine

## 2015-03-17 VITALS — BP 122/66 | HR 74 | Temp 97.5°F | Wt 127.5 lb

## 2015-03-17 DIAGNOSIS — M545 Low back pain: Secondary | ICD-10-CM

## 2015-03-17 MED ORDER — CYCLOBENZAPRINE HCL 10 MG PO TABS: 5.0000 mg | ORAL_TABLET | Freq: Every evening | ORAL | Status: DC | PRN

## 2015-03-17 MED ORDER — IBUPROFEN 800 MG PO TABS: 800.0000 mg | ORAL_TABLET | Freq: Three times a day (TID) | ORAL | Status: DC | PRN

## 2015-03-17 NOTE — Patient Instructions (Signed)
Ibuprofen with food, flexeril at night (sedation caution), gently stretch, use ice and heat.  Gradually return to exercise. Take care.  Glad to see you.

## 2015-03-17 NOTE — Progress Notes (Signed)
Pre visit review using our clinic review tool, if applicable. No additional management support is needed unless otherwise documented below in the visit note.  Low back pain.  [facility]. Yesterday she had left lower back pain.  She can't get comfortable in the meantime.  She tried '400mg'$  TID yesterday w/o a lot of relief.  Tried heat and ice today.  The pain isn't better than yesterday.  No R back pain initially, now in B lower back.  No abd pain.  No leg pain, weakness.  No trauma.    Meds, vitals, and allergies reviewed.   ROS: See HPI.  Otherwise, noncontributory.  GEN: nad, alert and oriented HEENT: mucous membranes moist NECK: supple w/o LA CV: rrr. PULM: ctab, no inc wob Back w/o midline pain.  L lower back ttp, pain with back extension, less so with flexion.   EXT: no edema S/S wnl BLE

## 2015-03-18 DIAGNOSIS — M545 Low back pain, unspecified: Secondary | ICD-10-CM | POA: Insufficient documentation

## 2015-03-18 NOTE — Assessment & Plan Note (Signed)
Likely benign MSK strain.  No reason to image.  Ibuprofen with GI caution. Flexeril with sedation caution.  Stretch and ice/heat.  F/u prn.  Should resolve.

## 2015-04-01 ENCOUNTER — Ambulatory Visit (INDEPENDENT_AMBULATORY_CARE_PROVIDER_SITE_OTHER): Payer: Commercial Managed Care - HMO | Admitting: Family Medicine

## 2015-04-01 ENCOUNTER — Encounter: Payer: Self-pay | Admitting: Family Medicine

## 2015-04-01 VITALS — BP 126/80 | HR 92 | Temp 98.7°F | Wt 132.0 lb

## 2015-04-01 DIAGNOSIS — M545 Low back pain: Secondary | ICD-10-CM

## 2015-04-01 MED ORDER — IBUPROFEN 800 MG PO TABS: 800.0000 mg | ORAL_TABLET | Freq: Three times a day (TID) | ORAL | Status: DC | PRN

## 2015-04-01 NOTE — Progress Notes (Signed)
Pre visit review using our clinic review tool, if applicable. No additional management support is needed unless otherwise documented below in the visit note.  She had gotten better from prev, was back to exercising last week.  She "went easy".  Thursday AM she was leaning over to put on some shoes and then had back pain.  Fluctuating pain in the meantime.  She has tried heat and ice w/o relief.  More pain last night esp in L lower back.  She tried tylenol and topical muscle rubs in the meantime.  She finally got comfortable and then went to sleep.  Again with pain across the lower back today.    No leg sx.  No weakness, no new bowel or bladder sx.  No rash, no bruising.    Meds, vitals, and allergies reviewed.   ROS: See HPI.  Otherwise, noncontributory.  nad ncat rrr ctab abd soft Back w/o midline pain.  Pain in L lower back, with standing and also with back extension S/s wnl BLE No rash

## 2015-04-01 NOTE — Assessment & Plan Note (Signed)
No need to image.  She had improved prev.  Refer to PT.  Restart ibuprofen, ice and heat, f/u prn.  Flexeril didn't help prev, no need to restart.   Okay for outpatient f/u.

## 2015-04-01 NOTE — Patient Instructions (Signed)
Vaughan Basta or Ebony Hail will call about your referral. Ibuprofen with food.  Alternate ice and heat.  Take care.  Glad to see you.

## 2015-04-21 ENCOUNTER — Other Ambulatory Visit: Payer: Self-pay

## 2015-04-21 DIAGNOSIS — Z1231 Encounter for screening mammogram for malignant neoplasm of breast: Secondary | ICD-10-CM

## 2015-04-27 ENCOUNTER — Other Ambulatory Visit: Payer: Self-pay | Admitting: Family Medicine

## 2015-04-27 NOTE — Telephone Encounter (Signed)
Sent!

## 2015-04-27 NOTE — Telephone Encounter (Signed)
See allergy/contraindication. Is it okay to refill? Patient has an upcoming appointment 05/21/15.

## 2015-05-10 ENCOUNTER — Other Ambulatory Visit: Payer: Self-pay | Admitting: Family Medicine

## 2015-05-10 DIAGNOSIS — I10 Essential (primary) hypertension: Secondary | ICD-10-CM

## 2015-05-10 DIAGNOSIS — M8580 Other specified disorders of bone density and structure, unspecified site: Secondary | ICD-10-CM

## 2015-05-14 ENCOUNTER — Other Ambulatory Visit (INDEPENDENT_AMBULATORY_CARE_PROVIDER_SITE_OTHER): Payer: Commercial Managed Care - HMO

## 2015-05-14 DIAGNOSIS — M8580 Other specified disorders of bone density and structure, unspecified site: Secondary | ICD-10-CM

## 2015-05-14 DIAGNOSIS — I10 Essential (primary) hypertension: Secondary | ICD-10-CM | POA: Diagnosis not present

## 2015-05-14 LAB — LIPID PANEL
Cholesterol: 145 mg/dL (ref 0–200)
HDL: 33.8 mg/dL — ABNORMAL LOW (ref >39.00)
LDL Cholesterol: 82 mg/dL (ref 0–99)
NonHDL: 111.2
Total CHOL/HDL Ratio: 4
Triglycerides: 145 mg/dL (ref 0.0–149.0)
VLDL: 29 mg/dL (ref 0.0–40.0)

## 2015-05-14 LAB — BASIC METABOLIC PANEL
BUN: 15 mg/dL (ref 6–23)
CO2: 30 mEq/L (ref 19–32)
Calcium: 9.3 mg/dL (ref 8.4–10.5)
Chloride: 106 mEq/L (ref 96–112)
Creatinine, Ser: 0.72 mg/dL (ref 0.40–1.20)
GFR: 84.87 mL/min (ref >60.00)
Glucose, Bld: 97 mg/dL (ref 70–99)
Potassium: 4.2 mEq/L (ref 3.5–5.1)
Sodium: 141 mEq/L (ref 135–145)

## 2015-05-14 LAB — VITAMIN D 25 HYDROXY (VIT D DEFICIENCY, FRACTURES): VITD: 28.61 ng/mL — ABNORMAL LOW (ref 30.00–100.00)

## 2015-05-21 ENCOUNTER — Encounter: Payer: Self-pay | Admitting: Family Medicine

## 2015-05-21 ENCOUNTER — Ambulatory Visit (INDEPENDENT_AMBULATORY_CARE_PROVIDER_SITE_OTHER): Payer: Commercial Managed Care - HMO | Admitting: Family Medicine

## 2015-05-21 VITALS — BP 120/62 | HR 83 | Temp 97.9°F | Ht 65.0 in | Wt 129.8 lb

## 2015-05-21 DIAGNOSIS — I10 Essential (primary) hypertension: Secondary | ICD-10-CM | POA: Diagnosis not present

## 2015-05-21 DIAGNOSIS — E785 Hyperlipidemia, unspecified: Secondary | ICD-10-CM

## 2015-05-21 DIAGNOSIS — R911 Solitary pulmonary nodule: Secondary | ICD-10-CM

## 2015-05-21 DIAGNOSIS — K219 Gastro-esophageal reflux disease without esophagitis: Secondary | ICD-10-CM

## 2015-05-21 DIAGNOSIS — Z7189 Other specified counseling: Secondary | ICD-10-CM

## 2015-05-21 DIAGNOSIS — Z0000 Encounter for general adult medical examination without abnormal findings: Secondary | ICD-10-CM | POA: Diagnosis not present

## 2015-05-21 MED ORDER — OMEPRAZOLE 40 MG PO CPDR: 40.0000 mg | DELAYED_RELEASE_CAPSULE | Freq: Every day | ORAL | Status: DC

## 2015-05-21 MED ORDER — LISINOPRIL 2.5 MG PO TABS: 2.5000 mg | ORAL_TABLET | Freq: Every day | ORAL | Status: DC

## 2015-05-21 NOTE — Assessment & Plan Note (Signed)
Controlled, labs d/w pt. Continue as is. She agrees.

## 2015-05-21 NOTE — Assessment & Plan Note (Signed)
Controlled, labs d/w pt. Continue as is. She agrees. Diet and exercise d/w pt.

## 2015-05-21 NOTE — Assessment & Plan Note (Signed)
Flu 2015 Shingles 2011 PNA 2015 Tetanus 2010 Colon  2009 per Dr. Earlean Shawl Breast cancer screening- routine mammogram pending.  DXA done 2014, can defer until 2017.  D/w pt.   Advance directive- d/w pt.  Has a living will.  Husband, then patient's son, then patient's sister designated in that order should patient be incapacitated.   Cognitive function addressed- see scanned forms- and if abnormal then additional documentation follows.

## 2015-05-21 NOTE — Assessment & Plan Note (Signed)
Advance directive- d/w pt.  Has a living will.  Husband, then patient's son, then patient's sister designated in that order should patient be incapacitated.

## 2015-05-21 NOTE — Assessment & Plan Note (Signed)
Controlled. Continue as is. She agrees.

## 2015-05-21 NOTE — Progress Notes (Signed)
Pre visit review using our clinic review tool, if applicable. No additional management support is needed unless otherwise documented below in the visit note.  I have personally reviewed the Medicare Annual Wellness questionnaire and have noted 1. The patient's medical and social history 2. Their use of alcohol, tobacco or illicit drugs 3. Their current medications and supplements 4. The patient's functional ability including ADL's, fall risks, home safety risks and hearing or visual             impairment. 5. Diet and physical activities 6. Evidence for depression or mood disorders  The patients weight, height, BMI have been recorded in the chart and visual acuity is per eye clinic.  I have made referrals, counseling and provided education to the patient based review of the above and I have provided the pt with a written personalized care plan for preventive services.  Provider list updated- see scanned forms.  Routine anticipatory guidance given to patient.  See health maintenance.  Flu 2015 Shingles 2011 PNA 2015 Tetanus 2010 Colon  2009 per Dr. Earlean Shawl Breast cancer screening- routine mammogram pending.  DXA done 2014, can defer until 2017.  D/w pt.   Advance directive- d/w pt.  Has a living will.  Husband, then patient's son, then patient's sister designated in that order should patient be incapacitated.   Cognitive function addressed- see scanned forms- and if abnormal then additional documentation follows.   Her back pain is better and she is done with PT.  She has exercises to do at home.  D/w pt.   Hypertension:    Using medication without problems or lightheadedness: yes Chest pain with exertion:no Edema:no Short of breath:no  Elevated Cholesterol: Using medications without problems:yes Muscle aches: no Diet compliance: yes Exercise:yes  D/w pt about pulmonary nodule- 26m, incidentally seen on CT prev.  D/w pt about options.  Reasonable to get 6 month f/u with chest CT  in ~08/2015 (or now if she prefers, ~3 months out from initial).  D/w pt.  She is low risk.  Never smoking.  No pulmonary sx.  She wanted to get f/u CT done 08/2015 and we'll work on getting that set up.    GERD controlled with PPI.  No ADE on med.  She was able to cut back to 1 a day.  She feels better on the med.  She is working on her diet.    PMH and SH reviewed  Meds, vitals, and allergies reviewed.   ROS: See HPI.  Otherwise negative.    GEN: nad, alert and oriented HEENT: mucous membranes moist NECK: supple w/o LA CV: rrr. PULM: ctab, no inc wob ABD: soft, +bs EXT: no edema SKIN: no acute rash

## 2015-05-21 NOTE — Patient Instructions (Signed)
Take care.  Glad to see you.  Don't change your meds for now.   I would get a flu shot each fall.

## 2015-05-21 NOTE — Assessment & Plan Note (Signed)
D/w pt about pulmonary nodule- 71m, incidentally seen on CT prev. D/w pt about options. Reasonable to get 6 month f/u with chest CT in ~08/2015 (or now if she prefers, ~3 months out from initial). D/w pt. She is low risk. Never smoking. No pulmonary sx. She wanted to get f/u CT done 08/2015 and we'll work on getting that set up.

## 2015-05-28 ENCOUNTER — Ambulatory Visit
Admission: RE | Admit: 2015-05-28 | Discharge: 2015-05-28 | Disposition: A | Discharge status: Home / Self Care | Payer: Commercial Managed Care - HMO | Source: Ambulatory Visit

## 2015-05-28 DIAGNOSIS — Z1231 Encounter for screening mammogram for malignant neoplasm of breast: Secondary | ICD-10-CM

## 2015-05-29 ENCOUNTER — Encounter: Payer: Self-pay | Admitting: *Deleted

## 2015-06-11 ENCOUNTER — Ambulatory Visit (INDEPENDENT_AMBULATORY_CARE_PROVIDER_SITE_OTHER): Payer: Commercial Managed Care - HMO | Admitting: Internal Medicine

## 2015-06-11 ENCOUNTER — Encounter: Payer: Self-pay | Admitting: Internal Medicine

## 2015-06-11 VITALS — BP 124/80 | HR 85 | Temp 98.0°F | Wt 130.0 lb

## 2015-06-11 DIAGNOSIS — R431 Parosmia: Secondary | ICD-10-CM | POA: Diagnosis not present

## 2015-06-11 DIAGNOSIS — D229 Melanocytic nevi, unspecified: Secondary | ICD-10-CM

## 2015-06-11 NOTE — Progress Notes (Signed)
Subjective:    Patient ID: [name], female    DOB: Mar 23, 1946, 69 y.o.   MRN: [id number]  HPI  Pt presents with c/o several weeks of a sensation of smelling 2 distinct burning smells--one like burning trash or a campfire, and one like cigarette smoke. This occurs intermittently. The smell because more intense as the day progresses. She denies runny nose or nasal congestion. No change in sense of taste. She has not tried anything OTC. She lives at home with her husband, and he has never smelled the smoke when she does. She denies any head trauma or significant exposure to chemicals, pesticides. She does not smoke.   She also inquired about a mole on her right chest. This has been there for a while It has not changed in shape or color. She was wondering if this is something that could be removed by her PCP or if she would need a referral to a dermatologist.   Review of Systems      Past Medical History  Diagnosis Date  . Hyperlipidemia   . Hypertension   . GERD (gastroesophageal reflux disease)   . Groin pain     Along superior/laterl R inguinal canal.  Could be due to hernia or old scar tissue.  prev with CT done w/o alarming findings.  Will treat episodically.  Use tylenol #3 prn.    . Normal cardiac stress test 2014    Current Outpatient Prescriptions  Medication Sig Dispense Refill  . Ascorbic Acid (VITAMIN C) 500 MG tablet Take 500 mg by mouth daily.      . Biotin (PA BIOTIN) 1000 MCG tablet Take 1,000 mcg by mouth daily.    . Calcium Carbonate (CALCIUM 500 PO) Take by mouth daily.      . Cholecalciferol (VITAMIN D-3) 1000 UNITS CAPS Take 1 capsule by mouth daily.    . Coenzyme Q10 (COQ10) 50 MG CAPS Take 1 capsule by mouth daily.      . fish oil-omega-3 fatty acids 1000 MG capsule Take 2 g by mouth daily.     . Folic Acid-Vit B4-WHQ P59 (FA-VITAMIN B-6-VITAMIN B-12 PO) Take 1 tablet by mouth daily.      Marland Kitchen lisinopril (PRINIVIL,ZESTRIL) 2.5 MG tablet Take 1 tablet (2.5 mg  total) by mouth daily. 90 tablet 3  . omeprazole (PRILOSEC) 40 MG capsule Take 1 capsule (40 mg total) by mouth daily. 90 capsule 3  . potassium chloride (KLOR-CON) 8 MEQ tablet Take 8 mEq by mouth daily.    . pravastatin (PRAVACHOL) 40 MG tablet TAKE 1 TABLET EVERY DAY 90 tablet 3  . [DISCONTINUED] calcium gluconate 500 MG tablet Take 500 mg by mouth daily.      . [DISCONTINUED] CALCIUM-MAG-VIT C-VIT D PO Take by mouth daily.      . [DISCONTINUED] metoprolol succinate (TOPROL-XL) 25 MG 24 hr tablet Take 25 mg by mouth at bedtime.      . [DISCONTINUED] simvastatin (ZOCOR) 40 MG tablet Take 40 mg by mouth at bedtime.       No current facility-administered medications for this visit.    Allergies  Allergen Reactions  . Atorvastatin     REACTION: myalgias  . Ciprofloxacin     REACTION: increase reflux  . Epinephrine     Heart racing with dental injection  . Flexeril [Cyclobenzaprine]     Ineffective for back pain  . Metoprolol Succinate Swelling    Swelling and stiffness in hands and ankles  . Simvastatin Other (See  Comments)    Muscle aches and stiffness  . Sulfonamide Derivatives     REACTION: itching    Family History  Problem Relation Age of Onset  . Transient ischemic attack Mother   . Hypertension Mother   . Stroke Mother     mini strokes  . Hypertension Sister   . Cancer Sister     ovarian  . Hypertension Sister   . Hypertension Sister   . Hypertension Sister   . Breast cancer Neg Hx   . Colon cancer Neg Hx     Social History   Social History  . Marital Status: Married    Spouse Name: N/A  . Number of Children: N/A  . Years of Education: N/A   Occupational History  . 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 Smoking status: Never Smoker   . Smokeless tobacco: Never Used  . Alcohol Use: No  . Drug Use: No  . Sexual Activity: No   Other Topics Concern  . Not on file   Social History Narrative   Married 1963 and lives with husband  (he had a CVA 11/12, to SNF and then home as of 12/2011)   Retired from medical office 2013.     Constitutional: Denies fever, malaise, fatigue, headache or abrupt weight changes.  HEENT: Denies eye pain, eye redness, ear pain, ringing in the ears, wax buildup, runny nose, nasal congestion, bloody nose, or sore throat. Respiratory: Denies difficulty breathing, shortness of breath, cough or sputum production.   Cardiovascular: Denies chest pain, chest tightness, palpitations or swelling in the hands or feet.  Skin: Pt reports mole on right chest. Denies redness, rashes or ulcercations.  Neurological: Denies dizziness, difficulty with memory, difficulty with speech or problems with balance and coordination.   No other specific complaints in a complete review of systems (except as listed in HPI above).  Objective:   Physical Exam   BP 124/80 mmHg  Pulse 85  Temp(Src) 98 F (36.7 C) (Oral)  Wt 130 lb (58.968 kg)  SpO2 99% Wt Readings from Last 3 Encounters:  06/11/15 130 lb (58.968 kg)  05/21/15 129 lb 12 oz (58.854 kg)  04/01/15 132 lb (59.875 kg)    General: Appears her stated age, well developed, well nourished in NAD. Skin: Warm, dry and intact. Round raised, scaly lesion noted above right clavicle. Fairly uniform in color. HEENT: Head: normal shape and size; Eyes: sclera white, no icterus, conjunctiva pink, PERRLA and EOMs intact; Ears: Tm's gray and intact, normal light reflex; Nose: mucosa boggy and moist, septum midline; Throat/Mouth: Teeth present, mucosa pink and moist, no exudate, lesions or ulcerations noted.  Neck: No adenopathy noted. Cardiovascular: Normal rate and rhythm. S1,S2 noted.  No murmur, rubs or gallops noted.  Pulmonary/Chest: Normal effort and positive vesicular breath sounds. No respiratory distress. No wheezes, rales or ronchi noted.  Neurological: Alert and oriented. Cranial nerves II-XII grossly intact.   BMET    Component Value Date/Time   NA 141  05/14/2015 0837   NA 141 07/01/2013 0103   K 4.2 05/14/2015 0837   K 3.8 07/01/2013 0103   CL 106 05/14/2015 0837   CL 110* 07/01/2013 0103   CO2 30 05/14/2015 0837   CO2 27 07/01/2013 0103   GLUCOSE 97 05/14/2015 0837   GLUCOSE 88 07/01/2013 0103   BUN 15 05/14/2015 0837   BUN 11 07/01/2013 0103   CREATININE 0.72 05/14/2015 0837   CREATININE 0.68 07/01/2013 0103  CALCIUM 9.3 05/14/2015 0837   CALCIUM 8.2* 07/01/2013 0103   GFRNONAA >60 07/01/2013 0103   GFRNONAA 76.34 02/11/2010 0834   GFRAA >60 07/01/2013 0103   GFRAA  12/26/2008 1406    >60        The eGFR has been calculated using the MDRD equation. This calculation has not been validated in all clinical situations. eGFR's persistently <60 mL/min signify possible Chronic Kidney Disease.    Lipid Panel     Component Value Date/Time   CHOL 145 05/14/2015 0837   CHOL 145 07/01/2013 0103   TRIG 145.0 05/14/2015 0837   TRIG 224* 07/01/2013 0103   HDL 33.80* 05/14/2015 0837   HDL 29* 07/01/2013 0103   CHOLHDL 4 05/14/2015 0837   VLDL 29.0 05/14/2015 0837   VLDL 45* 07/01/2013 0103   LDLCALC 82 05/14/2015 0837   LDLCALC 71 07/01/2013 0103    CBC    Component Value Date/Time   WBC 7.0 12/29/2014 1507   WBC 6.3 07/01/2013 0103   RBC 4.46 12/29/2014 1507   RBC 4.16 07/01/2013 0103   HGB 13.5 12/29/2014 1507   HGB 12.8 07/01/2013 0103   HCT 39.7 12/29/2014 1507   HCT 37.4 07/01/2013 0103   PLT 150.0 12/29/2014 1507   PLT 120* 07/01/2013 0103   MCV 89.0 12/29/2014 1507   MCV 90 07/01/2013 0103   MCH 30.8 07/01/2013 0103   MCHC 34.0 12/29/2014 1507   MCHC 34.3 07/01/2013 0103   RDW 14.3 12/29/2014 1507   RDW 14.5 07/01/2013 0103   LYMPHSABS 2.0 12/29/2014 1507   LYMPHSABS 2.1 07/01/2013 0103   MONOABS 0.4 12/29/2014 1507   MONOABS 0.5 07/01/2013 0103   EOSABS 0.1 12/29/2014 1507   EOSABS 0.1 07/01/2013 0103   BASOSABS 0.1 12/29/2014 1507   BASOSABS 0.0 07/01/2013 0103    Hgb A1C No results found  for: HGBA1C      Assessment & Plan:   Abnormal smell:  Will try Nasocort 1 spray each nostril daily x 2 weeks If symptoms persist, will refer to ENT for further evaluation  Mole on right chest:  Advised her to make follow up appt with Dr. Damita Dunnings and he should be able ot shave biopsy the mole in question  RTC as needed or if symptoms persist or worsen

## 2015-06-11 NOTE — Patient Instructions (Signed)
Moles Moles are usually harmless growths on the skin. They are accumulations of color (pigment) cells in the skin that:   Can be various colors, from light brown to black.  Can appear anywhere on the body.  May remain flat or become raised.  May contain hairs.  May remain smooth or develop wrinkling. Most moles are not cancerous (benign). However, some moles may develop changes and become cancerous. It is important to check your moles every month. If you check your moles regularly, you will be able to notice any changes that may occur.  CAUSES  Moles occur when skin cells grow together in clusters instead of spreading out in the skin as they normally do. The reason for this clustering is unknown. DIAGNOSIS  Your caregiver will perform a skin examination to diagnose your mole.  TREATMENT  Moles usually do not require treatment. If a mole becomes worrisome, your caregiver may choose to take a sample of the mole or remove it entirely, and then send it to a lab for examination.  HOME CARE INSTRUCTIONS  Check your mole(s) monthly for changes that may indicate skin cancer. These changes can include:  A change in size.  A change in color. Note that moles tend to darken during pregnancy or when taking birth control pills (oral contraception).  A change in shape.  A change in the border of the mole.  Wear sunscreen (with an SPF of at least 49) when you spend long periods of time outside. Reapply the sunscreen every 2-3 hours.  Schedule annual appointments with your skin doctor (dermatologist) if you have a large number of moles. SEEK MEDICAL CARE IF:  Your mole changes size, especially if it becomes larger than a pencil eraser.  Your mole changes in color or develops more than one color.  Your mole becomes itchy or bleeds.  Your mole, or the skin near the mole, becomes painful, sore, red, or swollen.  Your mole becomes scaly, sheds skin, or oozes fluid.  Your mole develops  irregular borders.  Your mole becomes flat or develops raised areas.  Your mole becomes hard or soft. Document Released: 07/12/2001 Document Revised: 07/11/2012 Document Reviewed: 04/30/2012 [facility] Patient Information 2015 [place], [place]. This information is not intended to replace advice given to you by your health care provider. Make sure you discuss any questions you have with your health care provider.

## 2015-06-11 NOTE — Progress Notes (Signed)
Pre visit review using our clinic review tool, if applicable. No additional management support is needed unless otherwise documented below in the visit note. 

## 2015-06-11 NOTE — Progress Notes (Signed)
   Subjective:    Patient ID: [name], female    DOB: [birth year], 69 y.o.   MRN: [id number]  HPI Ms.Marando presents with several weeks of a sensation of smelling 2 distinct burning smells--one like burning trash or a campfire, and one like cigarette smoke. The smell comes on and off, possibly worse at night, and seems to start randomly. Sometimes it is gone in the morning when she wakes, but then returns as the day progresses. Recently she also had a burning sensation in her nostrils and mild nasal congestion, no drainage or nosebleeds. No change in sense of taste. She has not tried anything specifically for this. She lives at home with her husband, and he has never smelled the smoke when she does. (She says he has a normal sense of smell.) She has these smells when out of the house, too, but hasn't asked anyone if they smell it then. She denies any head trauma or significant exposure to chemicals, pesticides. (She sprays a little Roundup but this is not new.) She does not smoke.  Recent eye doctor visit was normal. She denies changes in hearing or feeling off-balance. She had an episode of smelling smoke during the physical exam today; we could not smell anything.  She mentioned that a CT abdomen showed incidental finding of a pulmonary nodule; Dr. Damita Dunnings would like follow-up imaging in October 2016.  She also mentioned that for much longer she has had issues with pressure and tightness between her shoulder blades but hospital observation stay and cardiac workup 1 or 2 years ago was normal. She has some facial temporal tightness and shoulder tightness, and some tingling in her left hand and foot; she has tried Motrin.  She also inquired about a mole on her right chest.   Review of Systems Gen: No headache Ears: No ear pain or fullness or drainage, no change in hearing, no feeling off-balance  Nose: Smelling smoke; mild congestion; no nosebleeds Mouth/Throat: No sore throat, no change in  taste Heart: No chest pain, chest tightness, tachycardia, palpitations Lungs: No cough, SOB Ext: Occasional tingling left hand and foot MSK: Intermittent tightness in shoulder, between shoulder blades, and temples Skin: Round mole on her right chest that was removed and has come back    Objective:   Physical Exam  Gen: Well-nourished, well-developed Caucasian female, thin, appears stated age, pleasant and cooperative Eyes: PERRL, EOM's intact, no icterus Ears: Right TM normal; left TM no visualized d/t cerumen Nose: Turbinates mildly swollen; no sinus tenderness Mouth/Throat: Pharynx normal, no post-nasal drip; no maxillary or mandible tenderness Neck: No thyromegaly Heart: RRR, no m/r/g Lungs: CTA bilaterally MSK: No tenderness to palpation over spine or paraspinal muscles Skin: Round brown mole on right chest Neuro: Did not test sense of smell, visual acuity, or visual fields. Did not test CN VIII-X.   CN III, IV, V, VI, VII, XI, XII grossly intact.  Negative Romberg.        Assessment & Plan:  1. Abnormal smell  Plan: Will try 2 weeks of Flonase or Nasacort. If sensation persists after this, we will refer to ENT.  2. Mole on right side of chest Plan: She can make an appointment with Dr. Damita Dunnings for shave biopsy.  Magda Bernheim, PA-S

## 2015-06-26 ENCOUNTER — Telehealth: Payer: Self-pay

## 2015-06-26 NOTE — Telephone Encounter (Signed)
Noted, thank you for the update

## 2015-06-26 NOTE — Telephone Encounter (Signed)
This is FYI for [employer] NP,no cb needed; pt left v/m;pt seen 06/11/15 and has been using nasal spray as instructed and pt is doing much better; pt appreciative and if problem returns pt will cb.

## 2015-08-02 ENCOUNTER — Other Ambulatory Visit: Payer: Self-pay | Admitting: Family Medicine

## 2015-08-02 DIAGNOSIS — R911 Solitary pulmonary nodule: Secondary | ICD-10-CM

## 2015-08-11 ENCOUNTER — Ambulatory Visit
Admission: RE | Admit: 2015-08-11 | Discharge: 2015-08-11 | Disposition: A | Discharge status: Home / Self Care | Payer: Commercial Managed Care - HMO | Source: Ambulatory Visit | Attending: Family Medicine | Admitting: Family Medicine

## 2015-08-11 ENCOUNTER — Telehealth: Payer: Self-pay

## 2015-08-11 DIAGNOSIS — R911 Solitary pulmonary nodule: Secondary | ICD-10-CM | POA: Insufficient documentation

## 2015-08-11 DIAGNOSIS — R918 Other nonspecific abnormal finding of lung field: Secondary | ICD-10-CM

## 2015-08-11 NOTE — Telephone Encounter (Signed)
Called pt.  I talked with Dr. Chase Caller prior to calling patient.   Reasonable to proceed with PET and then pulmonary eval for consideration of bronch (with Dr. Lamonte Sakai).  D/w pt about possible ddx of the mass/abnormality.  She understood, thanked me for the call.   App help of all involved.

## 2015-08-11 NOTE — Telephone Encounter (Signed)
[name] with [facility] called report of CT of Chest; pt is not waiting. CT of chest is in pts chart under imaging; copy of report taken to Dr Damita Dunnings.

## 2015-08-12 ENCOUNTER — Telehealth: Payer: Self-pay | Admitting: Family Medicine

## 2015-08-12 ENCOUNTER — Ambulatory Visit: Admission: RE | Admit: 2015-08-12 | Payer: Commercial Managed Care - HMO | Source: Ambulatory Visit

## 2015-08-12 NOTE — Telephone Encounter (Signed)
I would still get the flu shot.  I will defer to pulmonary about keeping the appointment with Dr. Alva Garnet before vs after the PET scan.  Thanks.

## 2015-08-12 NOTE — Telephone Encounter (Signed)
Pt is scheduled to see you on Monday. She was scheduled for a PET scan today but received a call that it was cancelled b/c of issues with machine. This is an FYI for from the referring provider. I told the office to still have pt to come in for appt. Please let me know if you prefer anything different. Thanks.

## 2015-08-12 NOTE — Telephone Encounter (Signed)
Pet Scan set up for 08/12/15 at [facility] and Pulm appt on Mon 08/17/15

## 2015-08-12 NOTE — Telephone Encounter (Signed)
Patient called to say that [facility] called her and cancelled the Pet Scan for today b/c the machine broke. She has appt with Pulm on Monday at 10am. She also wants to know if she should get a flu shot that she is scheduled for this Friday, please advise. I spoke to Dr Alva Garnet nurse [facility] and will route this call to her as well so she can advise Dr Alva Garnet.

## 2015-08-12 NOTE — Telephone Encounter (Signed)
Called the patient and gave her the info.

## 2015-08-13 ENCOUNTER — Ambulatory Visit (INDEPENDENT_AMBULATORY_CARE_PROVIDER_SITE_OTHER): Payer: Commercial Managed Care - HMO

## 2015-08-13 DIAGNOSIS — Z23 Encounter for immunization: Secondary | ICD-10-CM | POA: Diagnosis not present

## 2015-08-14 ENCOUNTER — Ambulatory Visit
Admission: RE | Admit: 2015-08-14 | Discharge: 2015-08-14 | Disposition: A | Discharge status: Home / Self Care | Payer: Commercial Managed Care - HMO | Source: Ambulatory Visit | Attending: Family Medicine | Admitting: Family Medicine

## 2015-08-14 ENCOUNTER — Ambulatory Visit: Payer: Commercial Managed Care - HMO

## 2015-08-14 DIAGNOSIS — C3490 Malignant neoplasm of unspecified part of unspecified bronchus or lung: Secondary | ICD-10-CM | POA: Diagnosis not present

## 2015-08-14 DIAGNOSIS — R918 Other nonspecific abnormal finding of lung field: Secondary | ICD-10-CM | POA: Diagnosis present

## 2015-08-14 LAB — GLUCOSE, CAPILLARY: Glucose-Capillary: 82 mg/dL (ref 65–99)

## 2015-08-14 MED ORDER — FLUDEOXYGLUCOSE F - 18 (FDG) INJECTION
12.3300 | Freq: Once | INTRAVENOUS | Status: DC | PRN
  Administered 2015-08-14: 12.33 via INTRAVENOUS
  Filled 2015-08-14: qty 12.33

## 2015-08-17 ENCOUNTER — Telehealth: Payer: Self-pay | Admitting: Family Medicine

## 2015-08-17 ENCOUNTER — Encounter: Payer: Self-pay | Admitting: Pulmonary Disease

## 2015-08-17 ENCOUNTER — Ambulatory Visit (INDEPENDENT_AMBULATORY_CARE_PROVIDER_SITE_OTHER): Payer: Commercial Managed Care - HMO | Admitting: Pulmonary Disease

## 2015-08-17 VITALS — BP 140/84 | HR 100 | Ht 65.0 in | Wt 122.0 lb

## 2015-08-17 DIAGNOSIS — R911 Solitary pulmonary nodule: Secondary | ICD-10-CM

## 2015-08-17 NOTE — Telephone Encounter (Signed)
I will work on this but  I need to see the plan from [place] first and then we'll be in touch.  Thanks.

## 2015-08-17 NOTE — Progress Notes (Signed)
PULMONARY CONSULT NOTE  Requesting MD/Service: Elsie Stain, MD Date of consult: 08/17/15 Reason for consultation: Lung nodule  Pt Profile:  Patient Active Problem List   Diagnosis Date Noted  . Solitary pulmonary nodule 02/01/2015  . Advance care planning 05/14/2014  . Chest pain 07/03/2013  . Thrombocytopenia, unspecified ([place]) 07/03/2013  . Osteopenia 05/05/2013  . Medicare annual wellness visit, subsequent 04/26/2012  . GERD (gastroesophageal reflux disease) 04/26/2012  . Groin pain 01/12/2012  . Essential hypertension 11/17/2010  . HLD (hyperlipidemia) 01/31/2008    HPI:  1 never smoker underwent evaluation of abdominal pain in April 2016 with a CT abd that revealed a 4 mm nodule in her RML. In further evaluation of this finding, a CT chest was performed 08/11/15 with finding of a different nodule in superior segment of RLL adjacent to vertebrae. This was followed by PET scan demonstrating hypermetabolic activity in lung nodule as well as ipsilateral nodes and in thyroid gland. She is referred for further eval. The only symptom that she reports that might be attributable to these findings is pain in her back btw scapulae.  Past Medical History  Diagnosis Date  . Hyperlipidemia   . Hypertension   . GERD (gastroesophageal reflux disease)   . Groin pain     Along superior/laterl R inguinal canal.  Could be due to hernia or old scar tissue.  prev with CT done w/o alarming findings.  Will treat episodically.  Use tylenol #3 prn.    . Normal cardiac stress test 2014   Past Surgical History  Procedure Laterality Date  . Esophagogastroduodenoscopy  2002, 11/15/07    Normal (Dr. Allyn Kenner)  . Total vaginal hysterectomy  12/30/08    for fibroid pain (Dr. Gertie Fey)  . Facial cosmetic surgery  01/13/10    mini facelift  . Breast mass excision  1977    benign  . Hernia repair  1976  . Carpal tunnel release Right 1978  . Dilation and curettage of uterus  2001  . Colonoscopy  2009  .  Abdominal hysterectomy  12-30-08    MEDICATIONS:   Social History   Social History  . Marital Status: Married    Spouse Name: N/A  . Number of Children: N/A  . Years of Education: N/A   Occupational History  . 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 Smoking status: Never Smoker   . Smokeless tobacco: Never Used  . Alcohol Use: No  . Drug Use: No  . Sexual Activity: No   Other Topics Concern  . Not on file   Social History Narrative   Married 1963 and lives with husband (he had a CVA 11/12, to SNF and then home as of 12/2011)   Retired from medical office 2013.    Family History  Problem Relation Age of Onset  . Transient ischemic attack Mother   . Hypertension Mother   . Stroke Mother     mini strokes  . Hypertension Sister   . Cancer Sister     ovarian  . Hypertension Sister   . Hypertension Sister   . Hypertension Sister   . Breast cancer Neg Hx   . Colon cancer Neg Hx     Review of Systems  Constitutional: Negative for fever, chills, weight loss and malaise/fatigue.  HENT: Negative.   Eyes: Negative.   Respiratory: Negative for cough, hemoptysis, sputum production, shortness of breath and wheezing.   Cardiovascular: Negative.   Gastrointestinal: Positive for abdominal pain.  Musculoskeletal: Negative.   Skin: Negative.   Neurological: Negative.   Endo/Heme/Allergies: Negative.      Filed Vitals:   08/17/15 0949  BP: 140/84  Pulse: 100  Height: '5\' 5"'$  (1.651 m)  Weight: 55.339 kg (122 lb)  SpO2: 98%    EXAM:  Gen: WDWN, NAD HEENT: NCAT Neck: No LA, thyroid nodule not palpable Lungs: full BS, no adventiitous sounds Cardiovascular: Reg, no M Abdomen: Soft, NT, +BS Musculoskeletal: no C/C/E Neuro: no focal deficits Skin:   DATA:  BMP Latest Ref Rng 05/14/2015 12/29/2014 05/01/2014  Glucose 70 - 99 mg/dL 97 140(H) 94  BUN 6 - 23 mg/dL '15 17 15  '$ Creatinine 0.40 - 1.20 mg/dL 0.72 0.73 0.7  Sodium 135 - 145 mEq/L 141 136 139   Potassium 3.5 - 5.1 mEq/L 4.2 4.3 3.9  Chloride 96 - 112 mEq/L 106 103 104  CO2 19 - 32 mEq/L '30 30 28  '$ Calcium 8.4 - 10.5 mg/dL 9.3 9.4 9.1    CBC Latest Ref Rng 12/29/2014 07/29/2013 07/01/2013  WBC 4.0 - 10.5 K/uL 7.0 6.7 6.3  Hemoglobin 12.0 - 15.0 g/dL 13.5 13.5 12.8  Hematocrit 36.0 - 46.0 % 39.7 40.0 37.4  Platelets 150.0 - 400.0 K/uL 150.0 144.0(L) 120(L)     RADIOLOGY:  CT chest 08/11/15: IMPRESSION: 18 mm spiculated nodule in the superior segment of the right lower lobe is highly suspicious for primary bronchogenic carcinoma. Consider further evaluation with PET CT and/or tissue sampling.  4 mm right middle lobe nodule is stable since prior abdominal CT  PET 08/14/15:  1. Hypermetabolic superior segment right lower lobe 1.8 cm pulmonary nodule, in keeping with a primary bronchogenic carcinoma. 2. 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 No hypermetabolic contralateral mediastinal, contralateral hilar or supraclavicular adenopathy. 3. If the right lower lobe nodule proves to represent a non-small cell lung carcinoma, the PET staging is T1a N2. 4. Intensely hypermetabolic 1.7 cm right thyroid lobe nodule. Correlation with thyroid ultrasound and ultrasound-guided fine needle aspiration is advised, as a significant proportion of hypermetabolic thyroid nodules prove to be malignant. 5. No hypermetabolic distant metastatic disease.  IMPRESSION:   Incidental finding of RLL nodule - the RML nodule seen previously is probably insignificant. However the RLL nodule is hypermetabolic and there appears to be metastases to the hilar lymph nodes. It is not clear whether this represents bronchogenic ca or is a thyroid primary. The location would be very difficult to reach bronchoscopically and appears to be in a bad location for a transthoracic needle biopsy.   PLAN:  Will present @ [facility] 10/20 to discuss diagnostic options I have counseled pt re: my  concerns and the issues that will need to be discussed @ [place] After that conference, she will be contacted re: next step in evaluation   Merton Border, MD PCCM service Mobile [phone] Pager [phone]

## 2015-08-17 NOTE — Telephone Encounter (Signed)
Patient notified as instructed by telephone and verbalized understanding. Patient stated that Dr. Alva Garnet told her that he was having a conference call with others Thursday and he will be discussing her case with them. Patient stated that she will wait to hear back.

## 2015-08-17 NOTE — Patient Instructions (Signed)
We will schedule you to be discussed at the Multidisciplinary Oncology Conference this Thurs 10/20 and someone will contact you after that with the plan We have scheduled lung function testing in case surgery is planned

## 2015-08-17 NOTE — Telephone Encounter (Signed)
Patient called to ask Dr Damita Dunnings about the nodule on the thyroid that showed up on the Pet Scan? She saw Dr Alva Garnet today and she wants to know if you will be the one to help her with this nodule or will she need to see someone else? Please call the patient to advise .

## 2015-08-18 NOTE — Telephone Encounter (Signed)
Noted. Thanks.

## 2015-08-21 ENCOUNTER — Encounter
Admission: RE | Admit: 2015-08-21 | Discharge: 2015-08-21 | Disposition: A | Discharge status: Home / Self Care | Payer: Commercial Managed Care - HMO | Source: Ambulatory Visit | Attending: Internal Medicine | Admitting: Internal Medicine

## 2015-08-21 DIAGNOSIS — Z01818 Encounter for other preprocedural examination: Secondary | ICD-10-CM | POA: Diagnosis not present

## 2015-08-21 DIAGNOSIS — R918 Other nonspecific abnormal finding of lung field: Secondary | ICD-10-CM | POA: Insufficient documentation

## 2015-08-21 HISTORY — DX: Other specified postprocedural states: Z98.890

## 2015-08-21 HISTORY — DX: Nausea with vomiting, unspecified: R11.2

## 2015-08-21 LAB — BASIC METABOLIC PANEL
Anion gap: 3 — ABNORMAL LOW (ref 5–15)
BUN: 19 mg/dL (ref 6–20)
CO2: 29 mmol/L (ref 22–32)
Calcium: 9.3 mg/dL (ref 8.9–10.3)
Chloride: 107 mmol/L (ref 101–111)
Creatinine, Ser: 0.72 mg/dL (ref 0.44–1.00)
GFR calc Af Amer: >60 mL/min (ref >60)
GFR calc non Af Amer: >60 mL/min (ref >60)
Glucose, Bld: 113 mg/dL — ABNORMAL HIGH (ref 65–99)
Potassium: 4.2 mmol/L (ref 3.5–5.1)
Sodium: 139 mmol/L (ref 135–145)

## 2015-08-21 LAB — CBC
HCT: 40.2 % (ref 35.0–47.0)
Hemoglobin: 13.6 g/dL (ref 12.0–16.0)
MCH: 30.6 pg (ref 26.0–34.0)
MCHC: 33.8 g/dL (ref 32.0–36.0)
MCV: 90.5 fL (ref 80.0–100.0)
Platelets: 149 10*3/uL — ABNORMAL LOW (ref 150–440)
RBC: 4.44 MIL/uL (ref 3.80–5.20)
RDW: 14 % (ref 11.5–14.5)
WBC: 6.2 10*3/uL (ref 3.6–11.0)

## 2015-08-21 NOTE — Patient Instructions (Signed)
  Your procedure is scheduled on: Tuesday 08/25/15 Report to Day Surgery. 2ND FLOOR MEDICAL MALL ENTRANCE To find out your arrival time please call [phone] between 1PM - 3PM on Monday 08/24/15.  Remember: Instructions that are not followed completely may result in serious medical risk, up to and including death, or upon the discretion of your surgeon and anesthesiologist your surgery may need to be rescheduled.    __X__ 1. Do not eat food or drink liquids after midnight. No gum chewing or hard candies.     __X__ 2. No Alcohol for 24 hours before or after surgery.   ____ 3. Bring all medications with you on the day of surgery if instructed.    __X__ 4. Notify your doctor if there is any change in your medical condition     (cold, fever, infections).     Do not wear jewelry, make-up, hairpins, clips or nail polish.  Do not wear lotions, powders, or perfumes. You may wear deodorant.  Do not shave 48 hours prior to surgery. Men may shave face and neck.  Do not bring valuables to the hospital.    [facility] is not responsible for any belongings or valuables.               Contacts, dentures or bridgework may not be worn into surgery.  Leave your suitcase in the car. After surgery it may be brought to your room.  For patients admitted to the hospital, discharge time is determined by your                treatment team.   Patients discharged the day of surgery will not be allowed to drive home.   Please read over the following fact sheets that you were given:   Surgical Site Infection Prevention   __X__ Take these medicines the morning of surgery with A SIP OF WATER:    1. OMEPRAZOLE AT BEDTIME NIGHT BEFORE PROCEDURE AND AGAIN THE MORNING OF  2.   3.   4.  5.  6.  ____ Fleet Enema (as directed)   ____ Use CHG Soap as directed  ____ Use inhalers on the day of surgery  ____ Stop metformin 2 days prior to surgery    ____ Take 1/2 of usual insulin dose the night before  surgery and none on the morning of surgery.   ____ Stop Coumadin/Plavix/aspirin on   ____ Stop Anti-inflammatories on    __X__ Stop supplements until after surgery.    ____ Bring C-Pap to the hospital.

## 2015-08-25 ENCOUNTER — Ambulatory Visit
Admission: RE | Admit: 2015-08-25 | Discharge: 2015-08-25 | Disposition: A | Discharge status: Home / Self Care | Payer: Commercial Managed Care - HMO | Source: Ambulatory Visit | Attending: Internal Medicine | Admitting: Internal Medicine

## 2015-08-25 ENCOUNTER — Encounter
Admission: RE | Disposition: A | Discharge status: Home / Self Care | Payer: Self-pay | Source: Ambulatory Visit | Attending: Internal Medicine

## 2015-08-25 ENCOUNTER — Ambulatory Visit: Payer: Commercial Managed Care - HMO

## 2015-08-25 ENCOUNTER — Ambulatory Visit: Payer: Commercial Managed Care - HMO | Admitting: Registered Nurse

## 2015-08-25 DIAGNOSIS — R918 Other nonspecific abnormal finding of lung field: Secondary | ICD-10-CM | POA: Insufficient documentation

## 2015-08-25 DIAGNOSIS — E785 Hyperlipidemia, unspecified: Secondary | ICD-10-CM | POA: Insufficient documentation

## 2015-08-25 DIAGNOSIS — R599 Enlarged lymph nodes, unspecified: Secondary | ICD-10-CM | POA: Insufficient documentation

## 2015-08-25 DIAGNOSIS — Z8041 Family history of malignant neoplasm of ovary: Secondary | ICD-10-CM | POA: Insufficient documentation

## 2015-08-25 DIAGNOSIS — K219 Gastro-esophageal reflux disease without esophagitis: Secondary | ICD-10-CM | POA: Diagnosis not present

## 2015-08-25 DIAGNOSIS — Z9889 Other specified postprocedural states: Secondary | ICD-10-CM | POA: Insufficient documentation

## 2015-08-25 DIAGNOSIS — I10 Essential (primary) hypertension: Secondary | ICD-10-CM | POA: Diagnosis not present

## 2015-08-25 DIAGNOSIS — R911 Solitary pulmonary nodule: Secondary | ICD-10-CM | POA: Diagnosis present

## 2015-08-25 DIAGNOSIS — R591 Generalized enlarged lymph nodes: Secondary | ICD-10-CM

## 2015-08-25 DIAGNOSIS — D696 Thrombocytopenia, unspecified: Secondary | ICD-10-CM | POA: Diagnosis not present

## 2015-08-25 DIAGNOSIS — Z8249 Family history of ischemic heart disease and other diseases of the circulatory system: Secondary | ICD-10-CM | POA: Diagnosis not present

## 2015-08-25 DIAGNOSIS — M8580 Other specified disorders of bone density and structure, unspecified site: Secondary | ICD-10-CM | POA: Insufficient documentation

## 2015-08-25 DIAGNOSIS — Z90710 Acquired absence of both cervix and uterus: Secondary | ICD-10-CM | POA: Diagnosis not present

## 2015-08-25 DIAGNOSIS — E041 Nontoxic single thyroid nodule: Secondary | ICD-10-CM | POA: Insufficient documentation

## 2015-08-25 DIAGNOSIS — Z823 Family history of stroke: Secondary | ICD-10-CM | POA: Insufficient documentation

## 2015-08-25 HISTORY — PX: ENDOBRONCHIAL ULTRASOUND: SHX5096

## 2015-08-25 HISTORY — PX: ELECTROMAGNETIC NAVIGATION BROCHOSCOPY: SHX5369

## 2015-08-25 SURGERY — ENDOBRONCHIAL ULTRASOUND (EBUS)
Anesthesia: General

## 2015-08-25 MED ORDER — DEXAMETHASONE SODIUM PHOSPHATE 4 MG/ML IJ SOLN
INTRAMUSCULAR | Status: DC | PRN
  Administered 2015-08-25: 10 mg via INTRAVENOUS

## 2015-08-25 MED ORDER — LIDOCAINE HCL (CARDIAC) 20 MG/ML IV SOLN
INTRAVENOUS | Status: DC | PRN
  Administered 2015-08-25: 80 mg via INTRAVENOUS

## 2015-08-25 MED ORDER — ONDANSETRON HCL 4 MG/2ML IJ SOLN
INTRAMUSCULAR | Status: DC | PRN
  Administered 2015-08-25: 4 mg via INTRAVENOUS

## 2015-08-25 MED ORDER — FENTANYL CITRATE (PF) 100 MCG/2ML IJ SOLN
25.0000 ug | INTRAMUSCULAR | Status: DC | PRN
  Administered 2015-08-25 (×2): 25 ug via INTRAVENOUS

## 2015-08-25 MED ORDER — MIDAZOLAM HCL 2 MG/2ML IJ SOLN
INTRAMUSCULAR | Status: DC | PRN
  Administered 2015-08-25: 2 mg via INTRAVENOUS

## 2015-08-25 MED ORDER — ONDANSETRON HCL 4 MG/2ML IJ SOLN: 4.0000 mg | Freq: Once | INTRAMUSCULAR | Status: DC | PRN

## 2015-08-25 MED ORDER — SUCCINYLCHOLINE CHLORIDE 20 MG/ML IJ SOLN
INTRAMUSCULAR | Status: DC | PRN
  Administered 2015-08-25: 80 mg via INTRAVENOUS

## 2015-08-25 MED ORDER — PROPOFOL 10 MG/ML IV BOLUS
INTRAVENOUS | Status: DC | PRN
  Administered 2015-08-25: 130 mg via INTRAVENOUS

## 2015-08-25 MED ORDER — ROCURONIUM BROMIDE 100 MG/10ML IV SOLN
INTRAVENOUS | Status: DC | PRN
  Administered 2015-08-25: 30 mg via INTRAVENOUS

## 2015-08-25 MED ORDER — FENTANYL CITRATE (PF) 100 MCG/2ML IJ SOLN
INTRAMUSCULAR | Status: AC
  Administered 2015-08-25: 25 ug via INTRAVENOUS
  Filled 2015-08-25: qty 2

## 2015-08-25 MED ORDER — NEOSTIGMINE METHYLSULFATE 10 MG/10ML IV SOLN
INTRAVENOUS | Status: DC | PRN
  Administered 2015-08-25: 3 mg via INTRAVENOUS

## 2015-08-25 MED ORDER — FENTANYL CITRATE (PF) 100 MCG/2ML IJ SOLN
INTRAMUSCULAR | Status: DC | PRN
  Administered 2015-08-25: 100 ug via INTRAVENOUS

## 2015-08-25 MED ORDER — GLYCOPYRROLATE 0.2 MG/ML IJ SOLN
INTRAMUSCULAR | Status: DC | PRN
  Administered 2015-08-25: 0.4 mg via INTRAVENOUS

## 2015-08-25 MED ORDER — LACTATED RINGERS IV SOLN
INTRAVENOUS | Status: DC
  Administered 2015-08-25: 14:00:00 via INTRAVENOUS

## 2015-08-25 NOTE — Anesthesia Preprocedure Evaluation (Signed)
Anesthesia Evaluation  Patient identified by MRN, date of birth, ID band Patient awake    Reviewed: Allergy & Precautions, NPO status , Patient's Chart, lab work & pertinent test results  History of Anesthesia Complications (+) PONV  Airway Mallampati: III       Dental no notable dental hx.    Pulmonary neg pulmonary ROS,    Pulmonary exam normal        Cardiovascular hypertension, Pt. on medications Normal cardiovascular exam     Neuro/Psych    GI/Hepatic Neg liver ROS, GERD  ,  Endo/Other  negative endocrine ROS  Renal/GU negative Renal ROS     Musculoskeletal negative musculoskeletal ROS (+)   Abdominal Normal abdominal exam  (+)   Peds  Hematology negative hematology ROS (+)   Anesthesia Other Findings   Reproductive/Obstetrics negative OB ROS                             Anesthesia Physical Anesthesia Plan  ASA: II  Anesthesia Plan: General   Post-op Pain Management:    Induction: Intravenous  Airway Management Planned: Oral ETT  Additional Equipment:   Intra-op Plan:   Post-operative Plan: Extubation in OR  Informed Consent: I have reviewed the patients History and Physical, chart, labs and discussed the procedure including the risks, benefits and alternatives for the proposed anesthesia with the patient or authorized representative who has indicated his/her understanding and acceptance.     Plan Discussed with: CRNA  Anesthesia Plan Comments:         Anesthesia Quick Evaluation

## 2015-08-25 NOTE — Discharge Instructions (Signed)
Flexible Bronchoscopy, Care After Refer to this sheet in the next few weeks. These instructions provide you with information on caring for yourself after your procedure. Your health care provider may also give you more specific instructions. Your treatment has been planned according to current medical practices, but problems sometimes occur. Call your health care provider if you have any problems or questions after your procedure.  WHAT TO EXPECT AFTER THE PROCEDURE It is normal to have the following symptoms for 24-48 hours after the procedure:   Increased cough.  Low-grade fever.  Sore throat or hoarse voice.  Small streaks of blood in your thick spit (sputum) if tissue samples were taken (biopsy). HOME CARE INSTRUCTIONS   Do not eat or drink anything for 2 hours after your procedure. Your nose and throat were numbed by medicine. If you try to eat or drink before the medicine wears off, food or drink could go into your lungs or you could burn yourself. After the numbness is gone and your cough and gag reflexes have returned, you may eat soft food and drink liquids slowly.   The day after the procedure, you can go back to your normal diet.   You may resume normal activities.   Keep all follow-up visits as directed by your health care provider. It is important to keep all your appointments, especially if tissue samples were taken for testing (biopsy). SEEK IMMEDIATE MEDICAL CARE IF:   You have increasing shortness of breath.   You become light-headed or faint.   You have chest pain.   You have any new concerning symptoms.  You cough up more than a small amount of blood.  The amount of blood you cough up increases. MAKE SURE YOU:  Understand these instructions.  Will watch your condition.  Will get help right away if you are not doing well or get worse.   This information is not intended to replace advice given to you by your health care provider. Make sure you discuss  any questions you have with your health care provider.   Document Released: 05/06/2005 Document Revised: 11/07/2014 Document Reviewed: 06/21/2013 Elsevier Interactive Patient Education 2016 [place]   1) The drugs that you were given will stay in your system until tomorrow so for the next 24 hours you should not:  A) Drive an automobile B) Make any legal decisions C) Drink any alcoholic beverage   2) You may resume regular meals tomorrow.  Today it is better to start with liquids and gradually work up to solid foods.  You may eat anything you prefer, but it is better to start with liquids, then soup and crackers, and gradually work up to solid foods.   3) Please notify your doctor immediately if you have any unusual bleeding, trouble breathing, redness and pain at the surgery site, drainage, fever, or pain not relieved by medication. 4)   5) Your post-operative visit with Dr.                                     is: Date:                        Time:    Please call to schedule your post-operative visit.  6) Additional Instructions: 7)

## 2015-08-25 NOTE — Op Note (Signed)
Electromagnetic Navigation Bronchoscopy: Indication: lung mass  Preoperative Diagnosis:lung mass Post Procedure Diagnosis:lung mass Consent: written Risks and benefits explained in detail including risk of infection, bleeding, respiratory failure and death.   Hand washing performed prior to starting the procedure.   Type of Anesthesia: see Anesthesiology records .   Procedure Performed:  Virtual Bronchoscopy with Multi-planar Image analysis, 3-D reconstruction of coronal, sagittal and multi-planar images for the purposes of planning real-time bronchoscopy using the iLogic Electromagnetic Navigation Bronchoscopy System (superDimension)..   Description of Procedure: After obtaining informed consent from the patient, the above sedative and anesthetic measures were carried out, flexible fiberoptic bronchoscope was inserted via an oral bite block. Posterior pharynx was clear. The 2 vocal cords were easily traversed after application of local anesthetic.  The virtual camera was then placed into the central portion of the trachea. The trachea itself was inspected.  The main carina, right and left midstem bronchus and all the segmental and subsegmental airways by virtual bronchoscopy were brieftly inspected.  The camera was directed to standard registration points at the following centers: main carina, right upper lobe bronchus, right lower lobe bronchus, right middle lobe bronchus, left upper lobe bronchus, and the left lower lobe bronchus. This data was transferred to the i-Logic ENB system for real-time bronchoscopy.   Specimans Obtained:none I was unable to reach lung mass, there was no accessible endobronchial airway to the mass   Fluoroscopy:  Fluoroscopy was utilized during the course of this procedure to assure that biopsies were taken in a safe manner under fluoroscopic guidance with no spot films required.   Complications:none  Estimated Blood Loss: none  Monitoring:  The patient was  monitored with continuous oximetry and received supplemental nasal cannula oxygen throughout the procedure. In addition, serial blood pressure measurements and continuous electrocardiography showed these physiologic parameters to remain tolerable throughout the procedure.   Assessment and Plan/Additional Comments:unable reach RLL mass due to location   Corrin Parker, M.D.  [facility]  Medical Director [place] Director [facility] Cardio-Pulmonary Department

## 2015-08-25 NOTE — Op Note (Signed)
PROCEDURE: ENDOBRONCHIAL ULTRASOUND   PROCEDURE DATE: 08/25/2015  TIME:  NAME:  Shelene Krage  DOB:04-Sep-1946  MRN: [id number] LOC:  ARPO/None    HOSP DAY: '@LENGTHOFSTAYDAYS'$ @ CODE STATUS:        Indications/Preliminary Diagnosis: paratracheal adenopathy  Consent: (Place X beside choice/s below)  The benefits, risks and possible complications of the procedure were        explained to:  _x__ patient  _x__ patient's family  ___ other:___________  who verbalized understanding and gave:  ___ verbal  ___ written  _x__ verbal and written  ___ telephone  ___ other:________ consent.      Unable to obtain consent; procedure performed on emergent basis.     Other:       PRESEDATION ASSESSMENT: History and Physical has been performed. Patient meds and allergies have been reviewed. Presedation airway examination has been performed and documented. Baseline vital signs, sedation score, oxygenation status, and cardiac rhythm were reviewed. Patient was deemed to be in satisfactory condition to undergo the procedure.    PREMEDICATIONS: SEE ANESTHESIOLOGY RECORDS   Airway Prep (Place X beside choice below)   1% Transtracheal Lidocaine Anesthetization 7 cc   Patient prepped per Bronchoscopy Lab Policy       Insertion Route (Place X beside choice below)   Nasal   Oral  x Endotracheal Tube   Tracheostomy   INTRAPROCEDURE MEDICATIONS: SEE ANESTHESIOLOGY RECORDS   PROCEDURE DETAILS: Timeout performed and correct patient, name, & ID confirmed. Following prep per Pulmonary policy, appropriate sedation was administered.  Airway exam proceeded with findings, technical procedures, and specimen collection as noted below. At the end of exam the scope was withdrawn without incident. Impression and Plan as noted below.   Lower Paratracheal adenopathy was visualized under [place],  however, it was not extensive, there were vascular structures surrounding adenopathy  I attempted as many biopsies as I can  safely navigating around the vascular structures    TECHNICAL PROCEDURES: (Place X beside choice below)   Procedures  Description    None     Electrocautery     Cryotherapy     Balloon Dilatation     Bronchography     Stent Placement     Therapeutic Aspiration     Laser/Argon Plasma    Brachytherapy Catheter Placement    Foreign Body Removal     SPECIMENS (Sites): (Place X beside choice below)  Specimens Description   No Specimens Obtained     Washings    Lavage    Biopsies   x TRANSBRONCHIAL  Fine Needle Aspirates Paratracheal Adenopathy was not extensive FNA 10 gauge used x 3   Brushings    Sputum    FINDINGS: adenopathy ESTIMATED BLOOD LOSS: none COMPLICATIONS/RESOLUTION: none  IMPRESSION:POST-PROCEDURE DX: adenopathy  RECOMMENDATION/PLAN: follow up pathology results     Corrin Parker, M.D.  [facility]  Medical Director [place] Director [place] Department

## 2015-08-25 NOTE — H&P (View-Only) (Signed)
PULMONARY CONSULT NOTE  Requesting MD/Service: Elsie Stain, MD Date of consult: 08/17/15 Reason for consultation: Lung nodule  Pt Profile:  Patient Active Problem List   Diagnosis Date Noted  . Solitary pulmonary nodule 02/01/2015  . Advance care planning 05/14/2014  . Chest pain 07/03/2013  . Thrombocytopenia, unspecified ([place]) 07/03/2013  . Osteopenia 05/05/2013  . Medicare annual wellness visit, subsequent 04/26/2012  . GERD (gastroesophageal reflux disease) 04/26/2012  . Groin pain 01/12/2012  . Essential hypertension 11/17/2010  . HLD (hyperlipidemia) 01/31/2008    HPI:  66 never smoker underwent evaluation of abdominal pain in April 2016 with a CT abd that revealed a 4 mm nodule in her RML. In further evaluation of this finding, a CT chest was performed 08/11/15 with finding of a different nodule in superior segment of RLL adjacent to vertebrae. This was followed by PET scan demonstrating hypermetabolic activity in lung nodule as well as ipsilateral nodes and in thyroid gland. She is referred for further eval. The only symptom that she reports that might be attributable to these findings is pain in her back btw scapulae.  Past Medical History  Diagnosis Date  . Hyperlipidemia   . Hypertension   . GERD (gastroesophageal reflux disease)   . Groin pain     Along superior/laterl R inguinal canal.  Could be due to hernia or old scar tissue.  prev with CT done w/o alarming findings.  Will treat episodically.  Use tylenol #3 prn.    . Normal cardiac stress test 2014   Past Surgical History  Procedure Laterality Date  . Esophagogastroduodenoscopy  2002, 11/15/07    Normal (Dr. Allyn Kenner)  . Total vaginal hysterectomy  12/30/08    for fibroid pain (Dr. Gertie Fey)  . Facial cosmetic surgery  01/13/10    mini facelift  . Breast mass excision  1977    benign  . Hernia repair  1976  . Carpal tunnel release Right 1978  . Dilation and curettage of uterus  2001  . Colonoscopy  2009  .  Abdominal hysterectomy  12-30-08    MEDICATIONS:   Social History   Social History  . Marital Status: Married    Spouse Name: N/A  . Number of Children: N/A  . Years of Education: N/A   Occupational History  . 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 Smoking status: Never Smoker   . Smokeless tobacco: Never Used  . Alcohol Use: No  . Drug Use: No  . Sexual Activity: No   Other Topics Concern  . Not on file   Social History Narrative   Married 1963 and lives with husband (he had a CVA 11/12, to SNF and then home as of 12/2011)   Retired from medical office 2013.    Family History  Problem Relation Age of Onset  . Transient ischemic attack Mother   . Hypertension Mother   . Stroke Mother     mini strokes  . Hypertension Sister   . Cancer Sister     ovarian  . Hypertension Sister   . Hypertension Sister   . Hypertension Sister   . Breast cancer Neg Hx   . Colon cancer Neg Hx     Review of Systems  Constitutional: Negative for fever, chills, weight loss and malaise/fatigue.  HENT: Negative.   Eyes: Negative.   Respiratory: Negative for cough, hemoptysis, sputum production, shortness of breath and wheezing.   Cardiovascular: Negative.   Gastrointestinal: Positive for abdominal pain.  Musculoskeletal: Negative.   Skin: Negative.   Neurological: Negative.   Endo/Heme/Allergies: Negative.      Filed Vitals:   08/17/15 0949  BP: 140/84  Pulse: 100  Height: '5\' 5"'$  (1.651 m)  Weight: 55.339 kg (122 lb)  SpO2: 98%    EXAM:  Gen: WDWN, NAD HEENT: NCAT Neck: No LA, thyroid nodule not palpable Lungs: full BS, no adventiitous sounds Cardiovascular: Reg, no M Abdomen: Soft, NT, +BS Musculoskeletal: no C/C/E Neuro: no focal deficits Skin:   DATA:  BMP Latest Ref Rng 05/14/2015 12/29/2014 05/01/2014  Glucose 70 - 99 mg/dL 97 140(H) 94  BUN 6 - 23 mg/dL '15 17 15  '$ Creatinine 0.40 - 1.20 mg/dL 0.72 0.73 0.7  Sodium 135 - 145 mEq/L 141 136 139   Potassium 3.5 - 5.1 mEq/L 4.2 4.3 3.9  Chloride 96 - 112 mEq/L 106 103 104  CO2 19 - 32 mEq/L '30 30 28  '$ Calcium 8.4 - 10.5 mg/dL 9.3 9.4 9.1    CBC Latest Ref Rng 12/29/2014 07/29/2013 07/01/2013  WBC 4.0 - 10.5 K/uL 7.0 6.7 6.3  Hemoglobin 12.0 - 15.0 g/dL 13.5 13.5 12.8  Hematocrit 36.0 - 46.0 % 39.7 40.0 37.4  Platelets 150.0 - 400.0 K/uL 150.0 144.0(L) 120(L)     RADIOLOGY:  CT chest 08/11/15: IMPRESSION: 18 mm spiculated nodule in the superior segment of the right lower lobe is highly suspicious for primary bronchogenic carcinoma. Consider further evaluation with PET CT and/or tissue sampling.  4 mm right middle lobe nodule is stable since prior abdominal CT  PET 08/14/15:  1. Hypermetabolic superior segment right lower lobe 1.8 cm pulmonary nodule, in keeping with a primary bronchogenic carcinoma. 2. 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 No hypermetabolic contralateral mediastinal, contralateral hilar or supraclavicular adenopathy. 3. If the right lower lobe nodule proves to represent a non-small cell lung carcinoma, the PET staging is T1a N2. 4. Intensely hypermetabolic 1.7 cm right thyroid lobe nodule. Correlation with thyroid ultrasound and ultrasound-guided fine needle aspiration is advised, as a significant proportion of hypermetabolic thyroid nodules prove to be malignant. 5. No hypermetabolic distant metastatic disease.  IMPRESSION:   Incidental finding of RLL nodule - the RML nodule seen previously is probably insignificant. However the RLL nodule is hypermetabolic and there appears to be metastases to the hilar lymph nodes. It is not clear whether this represents bronchogenic ca or is a thyroid primary. The location would be very difficult to reach bronchoscopically and appears to be in a bad location for a transthoracic needle biopsy.   PLAN:  Will present @ [facility] 10/20 to discuss diagnostic options I have counseled pt re: my  concerns and the issues that will need to be discussed @ [place] After that conference, she will be contacted re: next step in evaluation   Merton Border, MD PCCM service Mobile [phone] Pager [phone]

## 2015-08-25 NOTE — Interval H&P Note (Signed)
History and Physical Interval Note:  08/25/2015 1:28 PM  [name]  has presented today for surgery, with the diagnosis of RIGHT LOWER LOBE MASS WITH ADENOPATHY  The various methods of treatment have been discussed with the patient and family. After consideration of risks, benefits and other options for treatment, the patient has consented to  Procedure(s): ENDOBRONCHIAL ULTRASOUND (N/A) ELECTROMAGNETIC NAVIGATION BRONCHOSCOPY (N/A) as a surgical intervention .  The patient's history has been reviewed, patient examined, no change in status, stable for surgery.  I have reviewed the patient's chart and labs.  Questions were answered to the patient's satisfaction.     Flora Lipps

## 2015-08-25 NOTE — Transfer of Care (Signed)
Immediate Anesthesia Transfer of Care Note  Patient: [name]  Procedure(s) Performed: Procedure(s): ENDOBRONCHIAL ULTRASOUND (N/A) ELECTROMAGNETIC NAVIGATION BRONCHOSCOPY (N/A)  Patient Location: PACU  Anesthesia Type:General  Level of Consciousness: sedated  Airway & Oxygen Therapy: Patient Spontanous Breathing and Patient connected to face mask oxygen  Post-op Assessment: Report given to RN and Post -op Vital signs reviewed and stable  Post vital signs: Reviewed and stable  Last Vitals:  Filed Vitals:   08/25/15 1512  BP: 134/74  Pulse: 99  Temp: 36.3 C  Resp: 18    Complications: No apparent anesthesia complications

## 2015-08-25 NOTE — Anesthesia Procedure Notes (Signed)
Procedure Name: Intubation Date/Time: 08/25/2015 1:52 PM Performed by: Doreen Salvage Pre-anesthesia Checklist: Patient identified, Patient being monitored, Emergency Drugs available and Suction available Patient Re-evaluated:Patient Re-evaluated prior to inductionOxygen Delivery Method: Circle System Utilized Preoxygenation: Pre-oxygenation with 100% oxygen Intubation Type: IV induction Ventilation: Mask ventilation without difficulty Laryngoscope Size: Mac and 3 Grade View: Grade II Tube type: Oral Tube size: 8.5 mm Number of attempts: 1 Airway Equipment and Method: Stylet Placement Confirmation: ETT inserted through vocal cords under direct vision,  positive ETCO2 and breath sounds checked- equal and bilateral Secured at: 22 cm Tube secured with: Tape Dental Injury: Teeth and Oropharynx as per pre-operative assessment

## 2015-08-26 ENCOUNTER — Other Ambulatory Visit: Payer: Self-pay | Admitting: Pulmonary Disease

## 2015-08-26 ENCOUNTER — Encounter: Payer: Self-pay | Admitting: Internal Medicine

## 2015-08-26 DIAGNOSIS — E041 Nontoxic single thyroid nodule: Secondary | ICD-10-CM

## 2015-08-26 NOTE — Anesthesia Postprocedure Evaluation (Signed)
  Anesthesia Post-op Note  Patient: [name]  Procedure(s) Performed: Procedure(s): ENDOBRONCHIAL ULTRASOUND (N/A) ELECTROMAGNETIC NAVIGATION BRONCHOSCOPY (N/A)  Anesthesia type:General  Patient location: PACU  Post pain: Pain level controlled  Post assessment: Post-op Vital signs reviewed, Patient's Cardiovascular Status Stable, Respiratory Function Stable, Patent Airway and No signs of Nausea or vomiting  Post vital signs: Reviewed and stable  Last Vitals:  Filed Vitals:   08/25/15 1643  BP: 133/62  Pulse: 71  Temp: 36.3 C  Resp: 20    Level of consciousness: awake, alert  and patient cooperative  Complications: No apparent anesthesia complications

## 2015-08-27 LAB — CYTOLOGY - NON PAP

## 2015-08-28 ENCOUNTER — Telehealth: Payer: Self-pay | Admitting: *Deleted

## 2015-08-28 NOTE — Telephone Encounter (Signed)
Please schedule pt with [facility] on Tuesday for f/u bronch results. Thanks.

## 2015-08-28 NOTE — Telephone Encounter (Signed)
Called and spoke with patient and scheduled appointment with Dr. Alva Garnet to discuss results on Tues Nov 1 at 11:40.  Pt is aware of appointment date and time. Rhonda J Cobb

## 2015-08-28 NOTE — Telephone Encounter (Signed)
Patient called and wanting results on her

## 2015-08-28 NOTE — Telephone Encounter (Signed)
   Panel 1: ENDOBRONCHIAL ULTRASOUND with Flora Lipps, MD        Patient called wanting her results.

## 2015-08-31 ENCOUNTER — Ambulatory Visit (INDEPENDENT_AMBULATORY_CARE_PROVIDER_SITE_OTHER): Payer: Commercial Managed Care - HMO | Admitting: Pulmonary Disease

## 2015-08-31 DIAGNOSIS — R911 Solitary pulmonary nodule: Secondary | ICD-10-CM | POA: Diagnosis not present

## 2015-08-31 LAB — PULMONARY FUNCTION TEST
DL/VA % pred: 94 %
DL/VA: 4.65 ml/min/mmHg/L
DLCO unc % pred: 68 %
DLCO unc: 17.44 ml/min/mmHg
FEF 25-75 Post: 0.64 L/sec
FEF 25-75 Pre: 1.5 L/sec
FEF2575-%Change-Post: -57 %
FEF2575-%Pred-Post: 33 %
FEF2575-%Pred-Pre: 78 %
FEV1-%Change-Post: -14 %
FEV1-%Pred-Post: 60 %
FEV1-%Pred-Pre: 71 %
FEV1-Post: 1.41 L
FEV1-Pre: 1.65 L
FEV1FVC-%Change-Post: -1 %
FEV1FVC-%Pred-Pre: 96 %
FEV6-%Change-Post: -14 %
FEV6-%Pred-Post: 65 %
FEV6-%Pred-Pre: 75 %
FEV6-Post: 1.92 L
FEV6-Pre: 2.23 L
FEV6FVC-%Change-Post: 0 %
FEV6FVC-%Pred-Post: 104 %
FEV6FVC-%Pred-Pre: 103 %
FVC-%Change-Post: -13 %
FVC-%Pred-Post: 63 %
FVC-%Pred-Pre: 73 %
FVC-Post: 1.95 L
FVC-Pre: 2.25 L
Post FEV1/FVC ratio: 72 %
Post FEV6/FVC ratio: 100 %
Pre FEV1/FVC ratio: 73 %
Pre FEV6/FVC Ratio: 99 %

## 2015-08-31 NOTE — Progress Notes (Signed)
PFT performed today with Nitrogen washout. 

## 2015-09-01 ENCOUNTER — Encounter: Payer: Self-pay | Admitting: Pulmonary Disease

## 2015-09-01 ENCOUNTER — Ambulatory Visit (INDEPENDENT_AMBULATORY_CARE_PROVIDER_SITE_OTHER): Payer: Commercial Managed Care - HMO | Admitting: Pulmonary Disease

## 2015-09-01 VITALS — BP 118/74 | HR 102 | Ht 65.0 in | Wt 119.8 lb

## 2015-09-01 DIAGNOSIS — E041 Nontoxic single thyroid nodule: Secondary | ICD-10-CM

## 2015-09-01 DIAGNOSIS — R911 Solitary pulmonary nodule: Secondary | ICD-10-CM

## 2015-09-01 NOTE — Progress Notes (Signed)
PROBLEMS: Thyroid nodule - positive PET scan RLL nodule - positive PET scan Hilar LAN - marginally positive on PET  INTERVAL HISTORY: Negative EBUS of R hilar lymph node 08/25/15 Could not get to RLL nodule by ENB   SUBJ: No new complaints.   OBJ: Filed Vitals:   09/01/15 1133  BP: 118/74  Pulse: 102  Height: '5\' 5"'$  (1.651 m)  Weight: 119 lb 12.8 oz (54.341 kg)  SpO2: 98%    Gen: NAD HEENT: All WNL Neck: NO LAN, R thyroid nodule not palpable Lungs: full BS, no adventitious sounds Cardiovascular: Reg rate, normal rhythm, no M noted Abdomen: Soft, NT +BS Ext: no C/C/E Neuro: CNs intact, motor/sens grossly intact Skin: No lesions noted   DATA: Cytology 10/25 negative  PFTs 08/31/15: c/w mild restriction and mild decrease in DLCO but pt had great difficulty performing procedure. Likely underestimates true lung function  IMPRESSION: R thyroid nodule, RLL nodule hilar LAN in a very healthy 69 yo never smoker - all positive by PET scan. Likely malignancy. Unclear which is primary. Possible synchronous malignancies. The RLL nodule is not accessible by bronchoscopy nor by transthoracic approach. Would require surgical approach. If this is deemed necessary, pt is a good candidate for VATS or thoracotomy  PLAN: I spent 10 mins reviewing our current data and clarifying our current concerns Has appointment with DR Tami Ribas next week to address thyroid nodule I have requested consultation by Oncology, Dr Oliva Bustard Follow up as needed   Wilhelmina Mcardle, MD [facility]/CCM

## 2015-09-03 ENCOUNTER — Telehealth: Payer: Self-pay | Admitting: Family Medicine

## 2015-09-03 ENCOUNTER — Inpatient Hospital Stay: Payer: Commercial Managed Care - HMO | Attending: Oncology | Admitting: Oncology

## 2015-09-03 ENCOUNTER — Other Ambulatory Visit: Payer: Self-pay | Admitting: Oncology

## 2015-09-03 ENCOUNTER — Inpatient Hospital Stay: Payer: Commercial Managed Care - HMO

## 2015-09-03 VITALS — BP 134/85 | HR 96 | Temp 98.0°F | Wt 119.9 lb

## 2015-09-03 DIAGNOSIS — C73 Malignant neoplasm of thyroid gland: Secondary | ICD-10-CM | POA: Insufficient documentation

## 2015-09-03 DIAGNOSIS — E041 Nontoxic single thyroid nodule: Secondary | ICD-10-CM

## 2015-09-03 DIAGNOSIS — Z79899 Other long term (current) drug therapy: Secondary | ICD-10-CM | POA: Diagnosis not present

## 2015-09-03 DIAGNOSIS — R911 Solitary pulmonary nodule: Secondary | ICD-10-CM

## 2015-09-03 DIAGNOSIS — E042 Nontoxic multinodular goiter: Secondary | ICD-10-CM | POA: Diagnosis not present

## 2015-09-03 DIAGNOSIS — E785 Hyperlipidemia, unspecified: Secondary | ICD-10-CM | POA: Diagnosis not present

## 2015-09-03 DIAGNOSIS — K219 Gastro-esophageal reflux disease without esophagitis: Secondary | ICD-10-CM | POA: Diagnosis not present

## 2015-09-03 DIAGNOSIS — Z90710 Acquired absence of both cervix and uterus: Secondary | ICD-10-CM | POA: Insufficient documentation

## 2015-09-03 DIAGNOSIS — I10 Essential (primary) hypertension: Secondary | ICD-10-CM | POA: Insufficient documentation

## 2015-09-03 LAB — CBC WITH DIFFERENTIAL/PLATELET
Basophils Absolute: 0.1 10*3/uL (ref 0–0.1)
Basophils Relative: 1 %
Eosinophils Absolute: 0 10*3/uL (ref 0–0.7)
Eosinophils Relative: 1 %
HCT: 43.2 % (ref 35.0–47.0)
Hemoglobin: 14.4 g/dL (ref 12.0–16.0)
Lymphocytes Relative: 26 %
Lymphs Abs: 1.8 10*3/uL (ref 1.0–3.6)
MCH: 30.1 pg (ref 26.0–34.0)
MCHC: 33.3 g/dL (ref 32.0–36.0)
MCV: 90.1 fL (ref 80.0–100.0)
Monocytes Absolute: 0.5 10*3/uL (ref 0.2–0.9)
Monocytes Relative: 7 %
Neutro Abs: 4.4 10*3/uL (ref 1.4–6.5)
Neutrophils Relative %: 65 %
Platelets: 158 10*3/uL (ref 150–440)
RBC: 4.79 MIL/uL (ref 3.80–5.20)
RDW: 14.2 % (ref 11.5–14.5)
WBC: 6.7 10*3/uL (ref 3.6–11.0)

## 2015-09-03 LAB — COMPREHENSIVE METABOLIC PANEL
ALT: 24 U/L (ref 14–54)
AST: 21 U/L (ref 15–41)
Albumin: 4 g/dL (ref 3.5–5.0)
Alkaline Phosphatase: 47 U/L (ref 38–126)
Anion gap: 4 — ABNORMAL LOW (ref 5–15)
BUN: 16 mg/dL (ref 6–20)
CO2: 29 mmol/L (ref 22–32)
Calcium: 8.7 mg/dL — ABNORMAL LOW (ref 8.9–10.3)
Chloride: 103 mmol/L (ref 101–111)
Creatinine, Ser: 0.67 mg/dL (ref 0.44–1.00)
GFR calc Af Amer: >60 mL/min (ref >60)
GFR calc non Af Amer: >60 mL/min (ref >60)
Glucose, Bld: 115 mg/dL — ABNORMAL HIGH (ref 65–99)
Potassium: 4 mmol/L (ref 3.5–5.1)
Sodium: 136 mmol/L (ref 135–145)
Total Bilirubin: 0.7 mg/dL (ref 0.3–1.2)
Total Protein: 6.9 g/dL (ref 6.5–8.1)

## 2015-09-03 LAB — TSH: TSH: 0.88 u[IU]/mL (ref 0.350–4.500)

## 2015-09-03 NOTE — Progress Notes (Signed)
Patient here today as new evaluation referred by Dr. Mortimer Fries for thyroid and lung nodules.

## 2015-09-03 NOTE — Telephone Encounter (Signed)
Called and talked to patient.  Had seen consult notes from pulmonary.  She has had onc eval and has ENT pending.  U/s of thyroid is pending.  D/w pt about the rationale for her appointments/plan.  Will await pending consults and tests.  I mainly called to check on her.  She thanked me for the call.  App help of all involved.

## 2015-09-04 ENCOUNTER — Encounter: Payer: Self-pay | Admitting: *Deleted

## 2015-09-04 ENCOUNTER — Other Ambulatory Visit: Payer: Self-pay | Admitting: Family Medicine

## 2015-09-04 DIAGNOSIS — R911 Solitary pulmonary nodule: Secondary | ICD-10-CM

## 2015-09-04 NOTE — Progress Notes (Signed)
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 Introduced Programmer, multimedia and reviewed plan of care to include ent evaluation 09/07/15, biopsy of thyroid, and presentation of case at multidisciplinary conference to determine route of biopsy for lung nodule. Contacted patient today 09/04/15, to notify that radiologist in conference would like to proceed with CT guided biopsy of lung nodule and that she should expect to be contacted with appointment details. Verbalized understanding.

## 2015-09-06 ENCOUNTER — Encounter: Payer: Self-pay | Admitting: Oncology

## 2015-09-06 NOTE — Progress Notes (Signed)
[place] @ [facility] [phone]  Fax:([phone]) [place]: Mar 06, 1946  MR#: [id number]  UUE#:[id number]  Patient Care Team: Tonia Ghent, MD as PCP - General (Family Medicine)  CHIEF COMPLAINT:  Chief Complaint  Patient presents with  . New Evaluation    VISIT DIAGNOSIS:     ICD-9-CM ICD-10-CM   1. Thyroid nodule 241.0 E04.1 CBC with Differential     Comprehensive metabolic panel     Thyroglobulin Level     T4     TSH     CANCELED: US Biopsy  2. Lung nodule 793.11 R91.1 CBC with Differential     Comprehensive metabolic panel     Thyroglobulin Level     T4     TSH      No history exists.    Oncology Flowsheet 08/25/2015  dexamethasone (DECADRON) IJ -  ondansetron (ZOFRAN) IV -    INTERVAL HISTORY:  54 never smoker underwent evaluation of abdominal pain in April 2016 with a CT abd that revealed a 4 mm nodule in her RML. In further evaluation of this finding, a CT chest was performed 08/11/15 with finding of a different nodule in superior segment of RLL adjacent to vertebrae. This was followed by PET scan demonstrating hypermetabolic activity in lung nodule as well as ipsilateral nodes and in thyroid gland. She is referred for further eval. The only symptom that she reports that might be attributable to these findings is pain in her back btw scapulae. Patient underwent EBUS and the biopsy was negative for lymph node involvement  Patient has been referred to me for further evaluation regarding abnormal CT scan and PET scan involving right middle lobe nodule along with abnormal thyroid nodule. Patient is a nonsmoker REVIEW OF SYSTEMS:    PERFORMANCE STATUS (ECOG):  0 HEENT:  No visual changes, runny nose, sore throat, mouth sores or tenderness. Lungs: No shortness of breath or cough.  No hemoptysis. Cardiac:  No chest pain, palpitations, orthopnea, or PND. GI:  No nausea, vomiting, diarrhea, constipation, melena or  hematochezia. GU:  No urgency, frequency, dysuria, or hematuria. Musculoskeletal:  No back pain.  No joint pain.  No muscle tenderness. Extremities:  No pain or swelling. Skin:  No rashes or skin changes. Neuro:  No headache, numbness or weakness, balance or coordination issues. Endocrine:  No diabetes, thyroid issues, hot flashes or night sweats. Psych:  No mood changes, depression or anxiety. Pain:  No focal pain. Review of systems:  All other systems reviewed and found to be negative.  As per HPI. Otherwise, a complete review of systems is negatve.  PAST MEDICAL HISTORY: Past Medical History  Diagnosis Date  . Hyperlipidemia   . Hypertension   . GERD (gastroesophageal reflux disease)   . Groin pain     Along superior/laterl R inguinal canal.  Could be due to hernia or old scar tissue.  prev with CT done w/o alarming findings.  Will treat episodically.  Use tylenol #3 prn.    . Normal cardiac stress test 2014  . PONV (postoperative nausea and vomiting)     nausea after hysterectomy    PAST SURGICAL HISTORY: Past Surgical History  Procedure Laterality Date  . Esophagogastroduodenoscopy  2002, 11/15/07    Normal (Dr. Allyn Kenner)  . Total vaginal hysterectomy  12/30/08    for fibroid pain (Dr. Gertie Fey)  . Facial cosmetic surgery  01/13/10    mini facelift  . Breast mass excision  1977  benign  . Hernia repair  1976  . Carpal tunnel release Right 1978  . Dilation and curettage of uterus  2001  . Colonoscopy  2009  . Abdominal hysterectomy  12-30-08  . Endobronchial ultrasound N/A 08/25/2015    Procedure: ENDOBRONCHIAL ULTRASOUND;  Surgeon: Flora Lipps, MD;  Location: [facility] ORS;  Service: Cardiopulmonary;  Laterality: N/A;  . Electromagnetic navigation brochoscopy N/A 08/25/2015    Procedure: ELECTROMAGNETIC NAVIGATION BRONCHOSCOPY;  Surgeon: Flora Lipps, MD;  Location: [facility] ORS;  Service: Cardiopulmonary;  Laterality: N/A;    FAMILY HISTORY Family History  Problem Relation Age  of Onset  . Transient ischemic attack Mother   . Hypertension Mother   . Stroke Mother     mini strokes  . Hypertension Sister   . Cancer Sister     ovarian  . Hypertension Sister   . Hypertension Sister   . Hypertension Sister   . Breast cancer Neg Hx   . Colon cancer Neg Hx     GYNECOLOGIC HISTORY:  No LMP recorded. Patient has had a hysterectomy.     ADVANCED DIRECTIVES:  Patient does not have any living will or healthcare power of attorney.  Information was given .  Available resources had been discussed.  We will follow-up on subsequent appointments regarding this issue  HEALTH MAINTENANCE: Social History  Substance Use Topics  . Smoking status: Never Smoker   . Smokeless tobacco: Never Used  . Alcohol Use: No     Colonoscopy:  PAP:  Bone density:  Lipid panel:  Allergies  Allergen Reactions  . Atorvastatin     REACTION: myalgias  . Ciprofloxacin     REACTION: increase reflux  . Epinephrine     Heart racing with dental injection  . Flexeril [Cyclobenzaprine]     Ineffective for back pain  . Metoprolol Succinate Swelling    Swelling and stiffness in hands and ankles  . Simvastatin Other (See Comments)    Muscle aches and stiffness  . Sulfonamide Derivatives     REACTION: itching    Current Outpatient Prescriptions  Medication Sig Dispense Refill  . Ascorbic Acid (VITAMIN C) 500 MG tablet Take 500 mg by mouth daily.      . Biotin (PA BIOTIN) 1000 MCG tablet Take 1,000 mcg by mouth daily.    . Calcium Carbonate (CALCIUM 500 PO) Take by mouth daily.      . Cholecalciferol (VITAMIN D-3) 1000 UNITS CAPS Take 1 capsule by mouth daily.    . Coenzyme Q10 (COQ10) 50 MG CAPS Take 1 capsule by mouth daily.      . fish oil-omega-3 fatty acids 1000 MG capsule Take 2 g by mouth daily.     . Folic Acid-Vit H8-IFO Y77 (FA-VITAMIN B-6-VITAMIN B-12 PO) Take 1 tablet by mouth daily.      Marland Kitchen lisinopril (PRINIVIL,ZESTRIL) 2.5 MG tablet Take 1 tablet (2.5 mg total) by mouth  daily. 90 tablet 3  . omeprazole (PRILOSEC) 40 MG capsule Take 1 capsule (40 mg total) by mouth daily. 90 capsule 3  . potassium chloride (KLOR-CON) 8 MEQ tablet Take 8 mEq by mouth daily.    . pravastatin (PRAVACHOL) 40 MG tablet TAKE 1 TABLET EVERY DAY 90 tablet 3  . [DISCONTINUED] calcium gluconate 500 MG tablet Take 500 mg by mouth daily.      . [DISCONTINUED] CALCIUM-MAG-VIT C-VIT D PO Take by mouth daily.      . [DISCONTINUED] metoprolol succinate (TOPROL-XL) 25 MG 24 hr tablet Take 25 mg by  mouth at bedtime.      . [DISCONTINUED] simvastatin (ZOCOR) 40 MG tablet Take 40 mg by mouth at bedtime.       No current facility-administered medications for this visit.    OBJECTIVE: PHYSICAL EXAM: GENERAL:  Well developed, well nourished, sitting comfortably in the exam room in no acute distress. MENTAL STATUS:  Alert and oriented to person, place and time. HEAD: .  Normocephalic, atraumatic, face symmetric, no Cushingoid features. EYES:.  Pupils equal round and reactive to light and accomodation.  No conjunctivitis or scleral icterus. ENT:  Oropharynx clear without lesion.  Tongue normal. Mucous membranes moist.  No palpable thyroid nodule RESPIRATORY:  Clear to auscultation without rales, wheezes or rhonchi. CARDIOVASCULAR:  Regular rate and rhythm without murmur, rub or gallop. BREAST:  Right breast without masses, skin changes or nipple discharge.  Left breast without masses, skin changes or nipple discharge. ABDOMEN:  Soft, non-tender, with active bowel sounds, and no hepatosplenomegaly.  No masses. BACK:  No CVA tenderness.  No tenderness on percussion of the back or rib cage. SKIN:  No rashes, ulcers or lesions. EXTREMITIES: No edema, no skin discoloration or tenderness.  No palpable cords. LYMPH NODES: No palpable cervical, supraclavicular, axillary or inguinal adenopathy  NEUROLOGICAL: Unremarkable. PSYCH:  Appropriate.  Filed Vitals:   09/03/15 1119  BP: 134/85  Pulse: 96    Temp: 98 F (36.7 C)     Body mass index is 19.96 kg/(m^2).    ECOG FS:0 - Asymptomatic  LAB RESULTS:  Appointment on 09/03/2015  Component Date Value Ref Range Status  . WBC 09/03/2015 6.7  3.6 - 11.0 K/uL Final  . RBC 09/03/2015 4.79  3.80 - 5.20 MIL/uL Final  . Hemoglobin 09/03/2015 14.4  12.0 - 16.0 g/dL Final  . HCT 09/03/2015 43.2  35.0 - 47.0 % Final  . MCV 09/03/2015 90.1  80.0 - 100.0 fL Final  . MCH 09/03/2015 30.1  26.0 - 34.0 pg Final  . MCHC 09/03/2015 33.3  32.0 - 36.0 g/dL Final  . RDW 09/03/2015 14.2  11.5 - 14.5 % Final  . Platelets 09/03/2015 158  150 - 440 K/uL Final  . Neutrophils Relative % 09/03/2015 65   Final  . Neutro Abs 09/03/2015 4.4  1.4 - 6.5 K/uL Final  . Lymphocytes Relative 09/03/2015 26   Final  . Lymphs Abs 09/03/2015 1.8  1.0 - 3.6 K/uL Final  . Monocytes Relative 09/03/2015 7   Final  . Monocytes Absolute 09/03/2015 0.5  0.2 - 0.9 K/uL Final  . Eosinophils Relative 09/03/2015 1   Final  . Eosinophils Absolute 09/03/2015 0.0  0 - 0.7 K/uL Final  . Basophils Relative 09/03/2015 1   Final  . Basophils Absolute 09/03/2015 0.1  0 - 0.1 K/uL Final  . Sodium 09/03/2015 136  135 - 145 mmol/L Final  . Potassium 09/03/2015 4.0  3.5 - 5.1 mmol/L Final  . Chloride 09/03/2015 103  101 - 111 mmol/L Final  . CO2 09/03/2015 29  22 - 32 mmol/L Final  . Glucose, Bld 09/03/2015 115* 65 - 99 mg/dL Final  . BUN 09/03/2015 16  6 - 20 mg/dL Final  . Creatinine, Ser 09/03/2015 0.67  0.44 - 1.00 mg/dL Final  . Calcium 09/03/2015 8.7* 8.9 - 10.3 mg/dL Final  . Total Protein 09/03/2015 6.9  6.5 - 8.1 g/dL Final  . Albumin 09/03/2015 4.0  3.5 - 5.0 g/dL Final  . AST 09/03/2015 21  15 - 41 U/L Final  .  ALT 09/03/2015 24  14 - 54 U/L Final  . Alkaline Phosphatase 09/03/2015 47  38 - 126 U/L Final  . Total Bilirubin 09/03/2015 0.7  0.3 - 1.2 mg/dL Final  . GFR calc non Af Amer 09/03/2015 >60  >60 mL/min Final  . GFR calc Af Amer 09/03/2015 >60  >60 mL/min Final    Comment: (NOTE) The eGFR has been calculated using the CKD EPI equation. This calculation has not been validated in all clinical situations. eGFR's persistently <60 mL/min signify possible Chronic Kidney Disease.   . Anion gap 09/03/2015 4* 5 - 15 Final  . TSH 09/03/2015 0.880  0.350 - 4.500 uIU/mL Final   Thyroglobulin level pending  STUDIES: Ct Chest Wo Contrast  08/11/2015  CLINICAL DATA:  Followup pulmonary nodule from prior abdominal CT EXAM: CT CHEST WITHOUT CONTRAST TECHNIQUE: Multidetector CT imaging of the chest was performed following the standard protocol without IV contrast. COMPARISON:  Abdominal CT 01/30/2015. FINDINGS: 4 mm nodule again noted in the right middle lobe as seen on prior abdominal CT and is stable. In addition, there is a spiculated 18 mm nodule noted in the medial superior segment of the right lower lobe on image 21 highly suspicious for primary lung cancer. No focal nodules on the left. No focal airspace opacities. No effusions. Heart is normal size. Aorta is normal caliber. Mildly prominent precarinal lymph node measures 11 mm in short axis diameter. No visible hilar or axillary adenopathy. Chest wall soft tissues are unremarkable. Imaging into the upper abdomen shows no acute findings. No acute bony abnormality or focal bone lesion. IMPRESSION: 18 mm spiculated nodule in the superior segment of the right lower lobe is highly suspicious for primary bronchogenic carcinoma. Consider further evaluation with PET CT and/or tissue sampling. 4 mm right middle lobe nodule is stable since prior abdominal CT. These results will be called to the ordering clinician or representative by the Radiologist Assistant, and communication documented in the PACS or zVision Dashboard. Electronically Signed   By: Rolm Baptise M.D.   On: 08/11/2015 10:19   Nm Pet Image Initial (pi) Skull Base To Thigh  08/14/2015  CLINICAL DATA:  Initial treatment strategy for right lower lobe pulmonary  nodule. No smoking history. EXAM: NUCLEAR MEDICINE PET SKULL BASE TO THIGH TECHNIQUE: 12.3 mCi F-18 FDG was injected intravenously. Full-ring PET imaging was performed from the skull base to thigh after the radiotracer. CT data was obtained and used for attenuation correction and anatomic localization. FASTING BLOOD GLUCOSE:  Value: 82 mg/dl COMPARISON:  08/11/2015 chest CT. FINDINGS: NECK No hypermetabolic lymph nodes in the neck. There is intensely hypermetabolic 1.7 cm hypodense anterior right thyroid lobe nodule (series 3/image 41) with max SUV 14.9. There is a non hypermetabolic 0.6 cm left thyroid lobe nodule. CHEST There is a 1.8 x 1.2 cm hypermetabolic pulmonary nodule in the medial superior segment right lower lobe (series 3/image 66) with max SUV 5.5. There is a 0.4 cm right middle lobe pulmonary nodule (3/81), unchanged since 04/05/2011 and without appreciable metabolism, benign. No acute consolidative airspace disease or additional significant pulmonary nodules. There is a mildly hypermetabolic right hilar node with max SUV 3.5 (series 3/image 70), which is poorly delineated on this noncontrast study. There is a mildly hypermetabolic 1.1 cm lower right paratracheal node (3/69) with max SUV 3.5. No hypermetabolic supraclavicular, contralateral mediastinal or left hilar adenopathy. No hypermetabolic axillary nodes. ABDOMEN/PELVIS No abnormal hypermetabolic activity within the liver, pancreas, adrenal glands, or spleen. No hypermetabolic lymph  nodes in the abdomen or pelvis. A punctate 1 mm calcification in the interpolar left kidney could represent a tiny nonobstructing stone. Status post hysterectomy, with no abnormal findings of the vaginal cuff. Non hypermetabolic subcutaneous 2.2 cm cystic structure in the lateral posterior left gluteal soft tissues, likely a sebaceous cyst. SKELETON No focal hypermetabolic activity to suggest skeletal metastasis. IMPRESSION: 1. Hypermetabolic superior segment right  lower lobe 1.8 cm pulmonary nodule, in keeping with a primary bronchogenic carcinoma. 2. 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 No hypermetabolic contralateral mediastinal, contralateral hilar or supraclavicular adenopathy. 3. If the right lower lobe nodule proves to represent a non-small cell lung carcinoma, the PET staging is T1a N2. 4. Intensely hypermetabolic 1.7 cm right thyroid lobe nodule. Correlation with thyroid ultrasound and ultrasound-guided fine needle aspiration is advised, as a significant proportion of hypermetabolic thyroid nodules prove to be malignant. 5. No hypermetabolic distant metastatic disease. Electronically Signed   By: Ilona Sorrel M.D.   On: 08/14/2015 14:52    ASSESSMENT:  Patient had abnormal thyroid nodule.  Thyroglobulin level is pending.  Patient is going to be seen by Dr. Tami Ribas ENT surgeon.  After discussing with him in tumor conference and decided to proceed with ultrasound-guided biopsy of thyroid nodule  2.  Right middle lobe nodule which is PET positive.  Also had PET positive hilar lymph node and subcarinal lymph node which was on endobronchial ultrasound biopsy was negative for malignancy Case was discussed with thoracic surgeon: Patient is going to see on next Thursday. Case was also discussed in tumor conference Possibility of needle biopsy of lung nodule versus proceeding with surgery is being discussed.     Patient expressed understanding and was in agreement with this plan. She also understands that She can call clinic at any time with any questions, concerns, or complaints.    No matching staging information was found for the patient.  Forest Gleason, MD   09/06/2015 4:27 PM

## 2015-09-08 ENCOUNTER — Other Ambulatory Visit: Payer: Self-pay | Admitting: Oncology

## 2015-09-08 ENCOUNTER — Ambulatory Visit
Admission: RE | Admit: 2015-09-08 | Discharge: 2015-09-08 | Disposition: A | Discharge status: Home / Self Care | Payer: Commercial Managed Care - HMO | Source: Ambulatory Visit | Attending: Oncology | Admitting: Oncology

## 2015-09-08 DIAGNOSIS — C73 Malignant neoplasm of thyroid gland: Secondary | ICD-10-CM | POA: Insufficient documentation

## 2015-09-08 DIAGNOSIS — E041 Nontoxic single thyroid nodule: Secondary | ICD-10-CM

## 2015-09-08 HISTORY — DX: Nontoxic single thyroid nodule: E04.1

## 2015-09-08 LAB — T4: T4, Total: 8.3 ug/dL (ref 4.5–12.0)

## 2015-09-08 LAB — THYROGLOBULIN LEVEL: Thyroglobulin: 8.4 ng/mL

## 2015-09-08 MED ORDER — HYDROCODONE-ACETAMINOPHEN 5-325 MG PO TABS: 1.0000 | ORAL_TABLET | ORAL | Status: DC | PRN

## 2015-09-08 NOTE — Procedures (Signed)
U/S right thyroid bipsy with FNA and core.  Complications:  None  Blood Loss: none  See dictation in canopy pacs

## 2015-09-09 LAB — SURGICAL PATHOLOGY

## 2015-09-10 ENCOUNTER — Inpatient Hospital Stay (HOSPITAL_BASED_OUTPATIENT_CLINIC_OR_DEPARTMENT_OTHER): Payer: Commercial Managed Care - HMO | Admitting: Oncology

## 2015-09-10 ENCOUNTER — Ambulatory Visit: Payer: Commercial Managed Care - HMO | Admitting: Cardiothoracic Surgery

## 2015-09-10 VITALS — BP 127/77 | HR 81 | Temp 97.3°F | Wt 120.6 lb

## 2015-09-10 DIAGNOSIS — C73 Malignant neoplasm of thyroid gland: Secondary | ICD-10-CM | POA: Diagnosis not present

## 2015-09-10 DIAGNOSIS — R911 Solitary pulmonary nodule: Secondary | ICD-10-CM | POA: Diagnosis not present

## 2015-09-10 LAB — CYTOLOGY - NON PAP

## 2015-09-10 NOTE — Progress Notes (Signed)
Patient here today for results.  

## 2015-09-11 ENCOUNTER — Encounter: Payer: Self-pay | Admitting: Oncology

## 2015-09-11 DIAGNOSIS — C73 Malignant neoplasm of thyroid gland: Secondary | ICD-10-CM | POA: Insufficient documentation

## 2015-09-11 NOTE — Progress Notes (Signed)
[place] @ [facility] Telephone:([phone]) [phone]  Fax:(336) [phone]  Follow-up visit  [name] OB: June 19, 1946  MR#: [id number]  BSW#:[id number]  Patient Care Team: Tonia Ghent, MD as PCP - General (Family Medicine)  CHIEF COMPLAINT:  Chief Complaint  Patient presents with  . Results   1.  Abnormal CT scan with thyroid mass and a right lower lobe lung mass which is PET positive. 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 VISIT DIAGNOSIS:   No diagnosis found.    No history exists.    Oncology Flowsheet 08/25/2015  dexamethasone (DECADRON) IJ -  ondansetron (ZOFRAN) IV -    INTERVAL HISTORY:  55 never smoker underwent evaluation of abdominal pain in April 2016 with a CT abd that revealed a 4 mm nodule in her RML. In further evaluation of this finding, a CT chest was performed 08/11/15 with finding of a different nodule in superior segment of RLL adjacent to vertebrae. This was followed by PET scan demonstrating hypermetabolic activity in lung nodule as well as ipsilateral nodes and in thyroid gland. She is referred for further eval. The only symptom that she reports that might be attributable to these findings is pain in her back btw scapulae. Patient underwent EBUS and the biopsy was negative for lymph node involvement  Patient has been referred to me for further evaluation regarding abnormal CT scan and PET scan involving right middle lobe nodule along with abnormal thyroid nodule. 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 Tami Ribas  REVIEW OF SYSTEMS:    PERFORMANCE STATUS (ECOG):  0 HEENT:  No visual changes, runny nose, sore throat, mouth sores or  tenderness. Lungs: No shortness of breath or cough.  No hemoptysis. Cardiac:  No chest pain, palpitations, orthopnea, or PND. GI:  No nausea, vomiting, diarrhea, constipation, melena or hematochezia. GU:  No urgency, frequency, dysuria, or hematuria. Musculoskeletal:  No back pain.  No joint pain.  No muscle tenderness. Extremities:  No pain or swelling. Skin:  No rashes or skin changes. Neuro:  No headache, numbness or weakness, balance or coordination issues. Endocrine:  No diabetes, thyroid issues, hot flashes or night sweats. Psych:  No mood changes, depression or anxiety. Pain:  No focal pain. Review of systems:  All other systems reviewed and found to be negative.  As per HPI. Otherwise, a complete review of systems is negatve.  PAST MEDICAL HISTORY: Past Medical History  Diagnosis Date  . Hyperlipidemia   . Hypertension   . GERD (gastroesophageal reflux disease)   . Groin pain     Along superior/laterl R inguinal canal.  Could be due to hernia or old scar tissue.  prev with CT done w/o alarming findings.  Will treat episodically.  Use tylenol #3 prn.    . Normal cardiac stress test 2014  . PONV (postoperative nausea and vomiting)     nausea after hysterectomy  . Thyroid nodule     PAST SURGICAL HISTORY: Past Surgical History  Procedure Laterality Date  . Esophagogastroduodenoscopy  2002, 11/15/07    Normal (Dr. Allyn Kenner)  . Total vaginal hysterectomy  12/30/08    for fibroid pain (Dr. Gertie Fey)  . Facial cosmetic surgery  01/13/10    mini facelift  .  Breast mass excision  1977    benign  . Hernia repair  1976  . Carpal tunnel release Right 1978  . Dilation and curettage of uterus  2001  . Colonoscopy  2009  . Abdominal hysterectomy  12-30-08  . Endobronchial ultrasound N/A 08/25/2015    Procedure: ENDOBRONCHIAL ULTRASOUND;  Surgeon: Flora Lipps, MD;  Location: [facility] ORS;  Service: Cardiopulmonary;  Laterality: N/A;  . Electromagnetic navigation brochoscopy N/A 08/25/2015      Procedure: ELECTROMAGNETIC NAVIGATION BRONCHOSCOPY;  Surgeon: Flora Lipps, MD;  Location: [facility] ORS;  Service: Cardiopulmonary;  Laterality: N/A;    FAMILY HISTORY Family History  Problem Relation Age of Onset  . Transient ischemic attack Mother   . Hypertension Mother   . Stroke Mother     mini strokes  . Hypertension Sister   . Cancer Sister     ovarian  . Hypertension Sister   . Hypertension Sister   . Hypertension Sister   . Breast cancer Neg Hx   . Colon cancer Neg Hx        ADVANCED DIRECTIVES:  Patient does not have any living will or healthcare power of attorney.  Information was given .  Available resources had been discussed.  We will follow-up on subsequent appointments regarding this issue  HEALTH MAINTENANCE: Social History  Substance Use Topics  . Smoking status: Never Smoker   . Smokeless tobacco: Never Used  . Alcohol Use: No     Colonoscopy:  PAP:  Bone density:  Lipid panel:  Allergies  Allergen Reactions  . Atorvastatin     REACTION: myalgias  . Ciprofloxacin     REACTION: increase reflux  . Epinephrine     Heart racing with dental injection  . Flexeril [Cyclobenzaprine]     Ineffective for back pain  . Metoprolol Succinate Swelling    Swelling and stiffness in hands and ankles  . Simvastatin Other (See Comments)    Muscle aches and stiffness  . Sulfonamide Derivatives     REACTION: itching    Current Outpatient Prescriptions  Medication Sig Dispense Refill  . Ascorbic Acid (VITAMIN C) 500 MG tablet Take 500 mg by mouth daily.      . Biotin (PA BIOTIN) 1000 MCG tablet Take 1,000 mcg by mouth daily.    . Calcium Carbonate (CALCIUM 500 PO) Take by mouth daily.      . Cholecalciferol (VITAMIN D-3) 1000 UNITS CAPS Take 1 capsule by mouth daily.    . Coenzyme Q10 (COQ10) 50 MG CAPS Take 1 capsule by mouth daily.      . fish oil-omega-3 fatty acids 1000 MG capsule Take 2 g by mouth daily.     . Folic Acid-Vit V2-ZDG U44 (FA-VITAMIN  B-6-VITAMIN B-12 PO) Take 1 tablet by mouth daily.      Marland Kitchen lisinopril (PRINIVIL,ZESTRIL) 2.5 MG tablet Take 1 tablet (2.5 mg total) by mouth daily. 90 tablet 3  . omeprazole (PRILOSEC) 40 MG capsule Take 1 capsule (40 mg total) by mouth daily. 90 capsule 3  . potassium chloride (KLOR-CON) 8 MEQ tablet Take 8 mEq by mouth daily.    . pravastatin (PRAVACHOL) 40 MG tablet TAKE 1 TABLET EVERY DAY 90 tablet 3  . [DISCONTINUED] calcium gluconate 500 MG tablet Take 500 mg by mouth daily.      . [DISCONTINUED] CALCIUM-MAG-VIT C-VIT D PO Take by mouth daily.      . [DISCONTINUED] metoprolol succinate (TOPROL-XL) 25 MG 24 hr tablet Take 25 mg by mouth at bedtime.      . [  DISCONTINUED] simvastatin (ZOCOR) 40 MG tablet Take 40 mg by mouth at bedtime.       No current facility-administered medications for this visit.    OBJECTIVE: PHYSICAL EXAM: GENERAL:  Well developed, well nourished, sitting comfortably in the exam room in no acute distress. MENTAL STATUS:  Alert and oriented to person, place and time. HEAD: .  Normocephalic, atraumatic, face symmetric, no Cushingoid features. EYES:.  Pupils equal round and reactive to light and accomodation.  No conjunctivitis or scleral icterus. ENT:  Oropharynx clear without lesion.  Tongue normal. Mucous membranes moist.  No palpable thyroid nodule RESPIRATORY:  Clear to auscultation without rales, wheezes or rhonchi. CARDIOVASCULAR:  Regular rate and rhythm without murmur, rub or gallop. BREAST:  Right breast without masses, skin changes or nipple discharge.  Left breast without masses, skin changes or nipple discharge. ABDOMEN:  Soft, non-tender, with active bowel sounds, and no hepatosplenomegaly.  No masses. BACK:  No CVA tenderness.  No tenderness on percussion of the back or rib cage. SKIN:  No rashes, ulcers or lesions. EXTREMITIES: No edema, no skin discoloration or tenderness.  No palpable cords. LYMPH NODES: No palpable cervical, supraclavicular,  axillary or inguinal adenopathy  NEUROLOGICAL: Unremarkable. PSYCH:  Appropriate.  Filed Vitals:   09/10/15 1126  BP: 127/77  Pulse: 81  Temp: 97.3 F (36.3 C)     Body mass index is 20.07 kg/(m^2).    ECOG FS:0 - Asymptomatic  LAB RESULTS:  Hospital Outpatient Visit on 09/08/2015  Component Date Value Ref Range Status  . CYTOLOGY - NON GYN 09/08/2015    Final                   Value:Cytology - Non PAP CASE: [id number] PATIENT: [name] Non-Gyn Cytology Report     SPECIMEN SUBMITTED: A. Thyroid, right, FNA  CLINICAL HISTORY: None provided  PRE-OPERATIVE DIAGNOSIS: None provided  POST-OPERATIVE DIAGNOSIS: None Provided     DIAGNOSIS: A. RIGHT THYROID; FINE-NEEDLE ASPIRATION: - POSITIVE FOR MALIGNANCY (BETHESDA VI), CONSISTENT WITH PAPILLARY THYROID CARCINOMA.  Note: Slides show sheets, papillae and microfollicles with variably sized nuclei which display irregular contours, grooves and pseudoinclusions. Nuclear crowding and overlap is seen in addition to moderate dense colloid. A cell block was included the interpretation of this case.   GROSS DESCRIPTION:  A. A. Site: right thyroid Procedure: ultrasound Cytotechnologist: Rivka Barbara and Molli Barrows Specimen(s) collected: 4 Diff Quik stained slides 4 Pap stained slides Specimen labeled patient's name and medical record number :      Description: clear CytoLyt so                         lution      Submitted for:           ThinPrep  A forceps biopsy was obtained and will be reported in a separate ARS report. Final Diagnosis performed by Delorse Lek, MD.  Electronically signed 09/10/2015 10:01:36AM    The electronic signature indicates that the named Attending Pathologist has evaluated the specimen  Technical component performed at [facility], [address] [place], [place] [address] Lab: [phone] Dir: Darrick Penna. Evette Doffing, MD  Professional component performed at [facility], [facility], [place], [place], [place] [address] Lab: [phone] Dir: Dellia Nims. Reuel Derby, MD    . SURGICAL PATHOLOGY 09/08/2015    Final                   Value:Surgical Pathology CASE:  [id number] PATIENT: [name] Surgical Pathology Report     SPECIMEN SUBMITTED: A. Thyroid, right, biopsy  CLINICAL HISTORY: None provided  PRE-OPERATIVE DIAGNOSIS: None provided  POST-OPERATIVE DIAGNOSIS: None provided     DIAGNOSIS: A. RIGHT THYROID; BIOPSY: - NEGATIVE FOR MALIGNANCY. - SKELETAL MUSCLE AND LYMPHOID TISSUE.   GROSS DESCRIPTION:  A. Labeled: patient's name and medical record number  Tissue fragment(s): multiple  Size: aggregate, 0.6 x 0.1 x 0.1 cm  Description: pink to tan fragments, wrapped in lens paper, marked orange submitted in a mesh bag  Entirely submitted in one cassette(s).    Final Diagnosis performed by Delorse Lek, MD.  Electronically signed 09/09/2015 3:14:04PM    The electronic signature indicates that the named Attending Pathologist has evaluated the specimen  Technical component performed at [facility], [address], [place], [place] [address] Lab: [phone] Dir: Darrick Penna                         . Evette Doffing, MD  Professional component performed at [facility], [facility], [place], [place], [place] [address] Lab: [phone] Dir: Dellia Nims. Rubinas, MD      STUDIES: Nm Pet Image Initial (pi) Skull Base To Thigh  08/14/2015  CLINICAL DATA:  Initial treatment strategy for right lower lobe pulmonary nodule. No smoking history. EXAM: NUCLEAR MEDICINE PET SKULL BASE TO THIGH TECHNIQUE: 12.3 mCi F-18 FDG was injected intravenously. Full-ring PET imaging was performed from the skull base to thigh after the radiotracer. CT data was obtained and used for attenuation correction and anatomic localization. FASTING BLOOD GLUCOSE:  Value: 82 mg/dl COMPARISON:  08/11/2015 chest CT. FINDINGS: NECK No  hypermetabolic lymph nodes in the neck. There is intensely hypermetabolic 1.7 cm hypodense anterior right thyroid lobe nodule (series 3/image 41) with max SUV 14.9. There is a non hypermetabolic 0.6 cm left thyroid lobe nodule. CHEST There is a 1.8 x 1.2 cm hypermetabolic pulmonary nodule in the medial superior segment right lower lobe (series 3/image 66) with max SUV 5.5. There is a 0.4 cm right middle lobe pulmonary nodule (3/81), unchanged since 04/05/2011 and without appreciable metabolism, benign. No acute consolidative airspace disease or additional significant pulmonary nodules. There is a mildly hypermetabolic right hilar node with max SUV 3.5 (series 3/image 70), which is poorly delineated on this noncontrast study. There is a mildly hypermetabolic 1.1 cm lower right paratracheal node (3/69) with max SUV 3.5. No hypermetabolic supraclavicular, contralateral mediastinal or left hilar adenopathy. No hypermetabolic axillary nodes. ABDOMEN/PELVIS No abnormal hypermetabolic activity within the liver, pancreas, adrenal glands, or spleen. No hypermetabolic lymph nodes in the abdomen or pelvis. A punctate 1 mm calcification in the interpolar left kidney could represent a tiny nonobstructing stone. Status post hysterectomy, with no abnormal findings of the vaginal cuff. Non hypermetabolic subcutaneous 2.2 cm cystic structure in the lateral posterior left gluteal soft tissues, likely a sebaceous cyst. SKELETON No focal hypermetabolic activity to suggest skeletal metastasis. IMPRESSION: 1. Hypermetabolic superior segment right lower lobe 1.8 cm pulmonary nodule, in keeping with a primary bronchogenic carcinoma. 2. 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 No hypermetabolic contralateral mediastinal, contralateral hilar or supraclavicular adenopathy. 3. If the right lower lobe nodule proves to represent a non-small cell lung carcinoma, the PET staging is T1a N2. 4. Intensely  hypermetabolic 1.7 cm right thyroid lobe nodule. Correlation with thyroid ultrasound and ultrasound-guided fine needle aspiration is advised, as a significant proportion of hypermetabolic thyroid nodules prove to  be malignant. 5. No hypermetabolic distant metastatic disease. Electronically Signed   By: Ilona Sorrel M.D.   On: 08/14/2015 14:52   US Thyroid Biopsy  09/08/2015  CLINICAL DATA:  Abnormal PET-CT with hypermetabolic right thyroid nodule EXAM: ULTRASOUND GUIDED NEEDLE ASPIRATE BIOPSY OF THE THYROID GLAND COMPARISON:  None. PROCEDURE: Thyroid biopsy was thoroughly discussed with the patient and questions were answered. The benefits, risks, alternatives, and complications were also discussed. The patient understands and wishes to proceed with the procedure. Written consent was obtained. Ultrasound was performed to localize and mark an adequate site for the biopsy. The patient was then prepped and draped in a normal sterile fashion. Local anesthesia was provided with 1% lidocaine. Using direct ultrasound guidance, 4 passes were made using needles into the nodule within the right lobe of the thyroid. Ultrasound was used to confirm needle placements on all occasions. Specimens were confirmed to be adequate by pathology. Additionally due to the somewhat extrinsic appearance of the lesion a single core biopsy pass was performed and sent in formalin. The patient tolerated the procedure well and was returned to her room in satisfactory condition. COMPLICATIONS: None. FINDINGS: Initial survey of the thyroid was performed and reveals a 1.1 cm well-circumscribed nodule arising along the lateral aspect of the right lobe of the thyroid. On some of the transverse imaging, it appears as though it may be extrinsic to the thyroid impinging into the gland. For this reason, the core biopsy was obtained in addition to the fine-needle aspirate. A few scattered smaller nodules are noted within the right lobe of the thyroid  which were also not metabolically active. Scattered nodules are noted throughout the left lobe of the thyroid none of which appeared hypermetabolic on the prior exam. The largest of these measures 11 mm in greatest dimension. IMPRESSION: Ultrasound guided needle aspirate and core biopsy performed of the right thyroid nodule. Bilateral thyroid nodules which were not hypermetabolic on recent PET-CT and do not meet biopsy criteria. Electronically Signed   By: Inez Catalina M.D.   On: 09/08/2015 10:02    ASSESSMENT:  1.  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  Right middle lobe nodule which is PET positive.  Also had PET positive hilar lymph node and subcarinal lymph node which was on endobronchial ultrasound biopsy was negative for malignancy Case was discussed in tumor conference and discussed with thoracic surgeon. At this point in time I would like to postpone biopsy till patient is evaluated by Dr. Faith Rogue.   Biopsy of the lung has been scheduled on 21st November If Dr. Faith Rogue decide to proceed with direct surgical intervention then biopsy may not be needed that has been discussed with the patient. Depending on surgical opinion from thoracic surgeon patient needs to be evaluated for thyroid surgery. Patient   will need thyroidectomy,      Patient expressed understanding and was in agreement with this plan. She also understands that She can call clinic at any time with any questions, concerns, or complaints.    No matching staging information was found for the patient.  Forest Gleason, MD   09/11/2015 5:15 PM

## 2015-09-14 ENCOUNTER — Ambulatory Visit: Admission: RE | Admit: 2015-09-14 | Payer: Commercial Managed Care - HMO | Source: Ambulatory Visit

## 2015-09-17 ENCOUNTER — Inpatient Hospital Stay (HOSPITAL_BASED_OUTPATIENT_CLINIC_OR_DEPARTMENT_OTHER): Payer: Commercial Managed Care - HMO | Admitting: Cardiothoracic Surgery

## 2015-09-17 VITALS — BP 123/79 | HR 92 | Temp 98.2°F | Resp 18 | Ht 65.0 in | Wt 120.8 lb

## 2015-09-17 DIAGNOSIS — R918 Other nonspecific abnormal finding of lung field: Secondary | ICD-10-CM | POA: Diagnosis not present

## 2015-09-17 DIAGNOSIS — C73 Malignant neoplasm of thyroid gland: Secondary | ICD-10-CM | POA: Diagnosis not present

## 2015-09-17 NOTE — Progress Notes (Signed)
Patient ID: [name], female   DOB: Jan 17, 1946, 69 y.o.   MRN: [id number]  Chief Complaint  Patient presents with  . Lung Lesion    Referral-Dr. Oliva Bustard    Referred By Dr. Ramond Craver he Reason for Referral right lower lobe mass  HPI Location, Quality, Duration, Severity, Timing, Context, Modifying Factors, Associated Signs and Symptoms.  [name] is a 69 y.o. female.  This patient is a 69 year old female who had a small pulmonary nodule identified on an abdominal CT scan about 6 months ago. Recommendation was to proceed with a repeat CT scan in 6 months for follow-up of that nodule. This demonstrated a new right lower lobe nodule and a subsequent PET scan performed revealed intense uptake in a right thyroid nodule and in the lung. In addition there are some hilar and lower paratracheal lymph node test disease by PET scanning. The patient then underwent a bronchoscopy and biopsy of the paratracheal nodes which were negative. The biopsy of the thyroid returned thyroid carcinoma. Patient is seen here today for consideration of surgical resection of her lung mass. She scheduled to have a lung biopsy later this week. She is a lifelong nonsmoker. She does not get short of breath. She did have some pulmonary function studies done which reveal an FEV1 and DLCO of approximate 70% predicted. She denied any fevers chills or weight loss. She denied any hemoptysis. She has some discomfort which she describes as being in her posterior right flank. The CT scan does not suggest any extraparenchymal involvement however. She also complains of occasional feeling of something getting caught in her throat but thinks this may be related to her newly diagnosed thyroid issue and her more focused attention on this area.   Past Medical History  Diagnosis Date  . Hyperlipidemia   . Hypertension   . GERD (gastroesophageal reflux disease)   . Groin pain     Along superior/laterl R inguinal canal.  Could be due to  hernia or old scar tissue.  prev with CT done w/o alarming findings.  Will treat episodically.  Use tylenol #3 prn.    . Normal cardiac stress test 2014  . PONV (postoperative nausea and vomiting)     nausea after hysterectomy  . Thyroid nodule     Past Surgical History  Procedure Laterality Date  . Esophagogastroduodenoscopy  2002, 11/15/07    Normal (Dr. Allyn Kenner)  . Total vaginal hysterectomy  12/30/08    for fibroid pain (Dr. Gertie Fey)  . Facial cosmetic surgery  01/13/10    mini facelift  . Breast mass excision  1977    benign  . Hernia repair  1976  . Carpal tunnel release Right 1978  . Dilation and curettage of uterus  2001  . Colonoscopy  2009  . Abdominal hysterectomy  12-30-08  . Endobronchial ultrasound N/A 08/25/2015    Procedure: ENDOBRONCHIAL ULTRASOUND;  Surgeon: Flora Lipps, MD;  Location: [facility];  Service: Cardiopulmonary;  Laterality: N/A;  . Electromagnetic navigation brochoscopy N/A 08/25/2015    Procedure: ELECTROMAGNETIC NAVIGATION BRONCHOSCOPY;  Surgeon: Flora Lipps, MD;  Location: [facility];  Service: Cardiopulmonary;  Laterality: N/A;    Family History  Problem Relation Age of Onset  . Transient ischemic attack Mother   . Hypertension Mother   . Stroke Mother     mini strokes  . Hypertension Sister   . Cancer Sister     ovarian  . Hypertension Sister   . Hypertension Sister   . Hypertension Sister   .  Breast cancer Neg Hx   . Colon cancer Neg Hx     Social History Social History  Substance Use Topics  . Smoking status: Never Smoker   . Smokeless tobacco: Never Used  . Alcohol Use: No    Allergies  Allergen Reactions  . Atorvastatin     REACTION: myalgias  . Ciprofloxacin     REACTION: increase reflux  . Epinephrine     Heart racing with dental injection  . Flexeril [Cyclobenzaprine]     Ineffective for back pain  . Metoprolol Succinate Swelling    Swelling and stiffness in hands and ankles  . Simvastatin Other (See Comments)    Muscle  aches and stiffness  . Sulfonamide Derivatives     REACTION: itching    Current Outpatient Prescriptions  Medication Sig Dispense Refill  . Ascorbic Acid (VITAMIN C) 500 MG tablet Take 500 mg by mouth daily.      . Biotin (PA BIOTIN) 1000 MCG tablet Take 1,000 mcg by mouth daily.    . Calcium Carbonate (CALCIUM 500 PO) Take by mouth daily.      . Cholecalciferol (VITAMIN D-3) 1000 UNITS CAPS Take 1 capsule by mouth daily.    . Coenzyme Q10 (COQ10) 50 MG CAPS Take 1 capsule by mouth daily.      . fish oil-omega-3 fatty acids 1000 MG capsule Take 2 g by mouth daily.     . Folic Acid-Vit J2-INO M76 (FA-VITAMIN B-6-VITAMIN B-12 PO) Take 1 tablet by mouth daily.      Marland Kitchen lisinopril (PRINIVIL,ZESTRIL) 2.5 MG tablet Take 1 tablet (2.5 mg total) by mouth daily. 90 tablet 3  . omeprazole (PRILOSEC) 40 MG capsule Take 1 capsule (40 mg total) by mouth daily. 90 capsule 3  . potassium chloride (KLOR-CON) 8 MEQ tablet Take 8 mEq by mouth daily.    . pravastatin (PRAVACHOL) 40 MG tablet TAKE 1 TABLET EVERY DAY 90 tablet 3  . [DISCONTINUED] calcium gluconate 500 MG tablet Take 500 mg by mouth daily.      . [DISCONTINUED] CALCIUM-MAG-VIT C-VIT D PO Take by mouth daily.      . [DISCONTINUED] metoprolol succinate (TOPROL-XL) 25 MG 24 hr tablet Take 25 mg by mouth at bedtime.      . [DISCONTINUED] simvastatin (ZOCOR) 40 MG tablet Take 40 mg by mouth at bedtime.       No current facility-administered medications for this visit.      Review of Systems A complete review of systems was asked and was negative except for the following positive findings cough, slight wheezing, easy bruising, frequent urination.  Blood pressure 123/79, pulse 92, temperature 98.2 F (36.8 C), temperature source Tympanic, resp. rate 18, height '5\' 5"'$  (1.651 m), weight 120 lb 13 oz (54.8 kg).  Physical Exam CONSTITUTIONAL:  Pleasant, well-developed, well-nourished, and in no acute distress. EYES: Pupils equal and reactive to  light, Sclera non-icteric EARS, NOSE, MOUTH AND THROAT:  The oropharynx was clear.  Dentition is good repair.  Oral mucosa pink and moist. LYMPH NODES:  Lymph nodes in the neck and axillae were normal RESPIRATORY:  Lungs were clear.  Normal respiratory effort without pathologic use of accessory muscles of respiration CARDIOVASCULAR: Heart was regular without murmurs.  There were no carotid bruits. GI: The abdomen was soft, nontender, and nondistended. There were no palpable masses. There was no hepatosplenomegaly. There were normal bowel sounds in all quadrants. GU:  Rectal deferred.   MUSCULOSKELETAL:  Normal muscle strength and tone.  No  clubbing or cyanosis.   SKIN:  There were no pathologic skin lesions.  There were no nodules on palpation. NEUROLOGIC:  Sensation is normal.  Cranial nerves are grossly intact. PSYCH:  Oriented to person, place and time.  Mood and affect are normal.  Data Reviewed CT scan and PET scan  I have personally reviewed the patient's imaging, laboratory findings and medical records.    Assessment    I have independently reviewed the patient's CT scan and PET scan. There is a right superior segment right lower lobe nodule which is concerning for a pulmonary malignancy. The also shows some paratracheal nodes that are positive. There is also some hilar nodes that are slightly positive as well. Her pulmonary function studies are adequate for pulmonary resection. I had a long discussion with her and her husband today about the options. She understands that this lesion in her lung is likely a lung cancer and not a metastasis from her thyroid although that is possible. I explained to her the indications and risks of right thoracotomy with right lower lobe resection. I reviewed the other options. At the present time she is going to undergo a CT-guided needle biopsy and will come back to our attention once that is complete.    Plan    We will set her up for a CT-guided  needle biopsy of the right lower lobe mass. She will follow-up with Dr. Ramond Craver he next week. She will contact me if she wishes to undergo surgical intervention.       Nestor Lewandowsky, MD 09/17/2015, 12:13 PM

## 2015-09-17 NOTE — Progress Notes (Signed)
Patient is referred by Dr. Oliva Bustard for lung nodule. Patient had PET Scan on 08/14/15 which was PET positive. Patient denies any pain but states that she has a sore throat since all of this started. Dr. Tami Ribas told patient that it was drainage. She also states that she has had less energy

## 2015-09-21 ENCOUNTER — Ambulatory Visit
Admission: RE | Admit: 2015-09-21 | Discharge: 2015-09-21 | Disposition: A | Discharge status: Home / Self Care | Payer: Commercial Managed Care - HMO | Source: Ambulatory Visit | Attending: Diagnostic Radiology | Admitting: Diagnostic Radiology

## 2015-09-21 ENCOUNTER — Ambulatory Visit
Admission: RE | Admit: 2015-09-21 | Discharge: 2015-09-21 | Disposition: A | Discharge status: Home / Self Care | Payer: Commercial Managed Care - HMO | Source: Ambulatory Visit | Attending: Family Medicine | Admitting: Family Medicine

## 2015-09-21 DIAGNOSIS — Z01812 Encounter for preprocedural laboratory examination: Secondary | ICD-10-CM | POA: Insufficient documentation

## 2015-09-21 DIAGNOSIS — C3490 Malignant neoplasm of unspecified part of unspecified bronchus or lung: Secondary | ICD-10-CM | POA: Diagnosis not present

## 2015-09-21 DIAGNOSIS — R911 Solitary pulmonary nodule: Secondary | ICD-10-CM | POA: Diagnosis present

## 2015-09-21 DIAGNOSIS — Z9889 Other specified postprocedural states: Secondary | ICD-10-CM

## 2015-09-21 DIAGNOSIS — R0489 Hemorrhage from other sites in respiratory passages: Secondary | ICD-10-CM | POA: Insufficient documentation

## 2015-09-21 DIAGNOSIS — Z0189 Encounter for other specified special examinations: Secondary | ICD-10-CM | POA: Insufficient documentation

## 2015-09-21 DIAGNOSIS — R042 Hemoptysis: Secondary | ICD-10-CM | POA: Diagnosis not present

## 2015-09-21 LAB — CBC WITH DIFFERENTIAL/PLATELET
Basophils Absolute: 0 10*3/uL (ref 0–0.1)
Basophils Relative: 1 %
Eosinophils Absolute: 0.1 10*3/uL (ref 0–0.7)
Eosinophils Relative: 2 %
HCT: 42.3 % (ref 35.0–47.0)
Hemoglobin: 14 g/dL (ref 12.0–16.0)
Lymphocytes Relative: 29 %
Lymphs Abs: 1.6 10*3/uL (ref 1.0–3.6)
MCH: 30 pg (ref 26.0–34.0)
MCHC: 33 g/dL (ref 32.0–36.0)
MCV: 91 fL (ref 80.0–100.0)
Monocytes Absolute: 0.5 10*3/uL (ref 0.2–0.9)
Monocytes Relative: 8 %
Neutro Abs: 3.3 10*3/uL (ref 1.4–6.5)
Neutrophils Relative %: 60 %
Platelets: 148 10*3/uL — ABNORMAL LOW (ref 150–440)
RBC: 4.65 MIL/uL (ref 3.80–5.20)
RDW: 14.2 % (ref 11.5–14.5)
WBC: 5.5 10*3/uL (ref 3.6–11.0)

## 2015-09-21 LAB — PROTIME-INR
INR: 1.02
Prothrombin Time: 13.6 seconds (ref 11.4–15.0)

## 2015-09-21 LAB — APTT: aPTT: 31 seconds (ref 24–36)

## 2015-09-21 MED ORDER — HYDROCODONE-ACETAMINOPHEN 5-325 MG PO TABS
1.0000 | ORAL_TABLET | ORAL | Status: DC | PRN
  Administered 2015-09-21: 1 via ORAL

## 2015-09-21 MED ORDER — MIDAZOLAM HCL 5 MG/5ML IJ SOLN
INTRAMUSCULAR | Status: AC | PRN
  Administered 2015-09-21: 1 mg via INTRAVENOUS
  Administered 2015-09-21 (×2): 0.5 mg via INTRAVENOUS

## 2015-09-21 MED ORDER — FENTANYL CITRATE (PF) 100 MCG/2ML IJ SOLN
INTRAMUSCULAR | Status: AC | PRN
  Administered 2015-09-21 (×2): 25 ug via INTRAVENOUS

## 2015-09-21 MED ORDER — HYDROCODONE-ACETAMINOPHEN 5-325 MG PO TABS
ORAL_TABLET | ORAL | Status: AC
  Filled 2015-09-21: qty 1

## 2015-09-21 MED ORDER — SODIUM CHLORIDE 0.9 % IV SOLN
INTRAVENOUS | Status: DC
  Administered 2015-09-21: 09:00:00 via INTRAVENOUS

## 2015-09-21 NOTE — Progress Notes (Signed)
Pt. Continues to cough post procedure, but minimal production of mucus/blood.  C/o 8:10 right chest pain.  Norco given per order.  Breath sounds remain  Even and clear bilaterally.

## 2015-09-21 NOTE — Procedures (Signed)
Under CT guidance, biopsy of right lower lobe mass was performed. Pulmonary hemorrhage was noted with hemoptysis, tx'd w/ suction. VS stable. No pneumothorax seen on postprocedure CT images.

## 2015-09-21 NOTE — Sedation Documentation (Signed)
Pt. Coughing up bright red blood-small amts.  Suctioned orally.  Blood patch used.  Turned to back onto stretcher as soon as possible after procedure,

## 2015-09-21 NOTE — Procedures (Signed)
Procedure and risks, including pneumothorax requiring hospitalization, discussed with patient. Informed consent obtained. Will perform CT-guided right lung biopsy.

## 2015-09-28 ENCOUNTER — Inpatient Hospital Stay: Payer: Commercial Managed Care - HMO | Admitting: Oncology

## 2015-09-28 ENCOUNTER — Encounter: Payer: Self-pay | Admitting: Oncology

## 2015-09-28 NOTE — Progress Notes (Signed)
Patient here today for Bx results.

## 2015-09-28 NOTE — Progress Notes (Signed)
     This encounter was created in error - please disregard. Patient was not examined today

## 2015-09-30 LAB — SURGICAL PATHOLOGY

## 2015-10-01 ENCOUNTER — Inpatient Hospital Stay (HOSPITAL_BASED_OUTPATIENT_CLINIC_OR_DEPARTMENT_OTHER): Payer: Commercial Managed Care - HMO | Admitting: Cardiothoracic Surgery

## 2015-10-01 ENCOUNTER — Encounter: Payer: Self-pay | Admitting: Oncology

## 2015-10-01 ENCOUNTER — Inpatient Hospital Stay: Payer: Commercial Managed Care - HMO | Attending: Oncology | Admitting: Oncology

## 2015-10-01 ENCOUNTER — Encounter: Payer: Self-pay | Admitting: Cardiothoracic Surgery

## 2015-10-01 VITALS — BP 121/72 | HR 88 | Ht 65.0 in | Wt 121.9 lb

## 2015-10-01 DIAGNOSIS — C3431 Malignant neoplasm of lower lobe, right bronchus or lung: Secondary | ICD-10-CM

## 2015-10-01 DIAGNOSIS — K219 Gastro-esophageal reflux disease without esophagitis: Secondary | ICD-10-CM | POA: Diagnosis not present

## 2015-10-01 DIAGNOSIS — C3491 Malignant neoplasm of unspecified part of right bronchus or lung: Secondary | ICD-10-CM

## 2015-10-01 DIAGNOSIS — Z79899 Other long term (current) drug therapy: Secondary | ICD-10-CM | POA: Insufficient documentation

## 2015-10-01 DIAGNOSIS — C73 Malignant neoplasm of thyroid gland: Secondary | ICD-10-CM | POA: Insufficient documentation

## 2015-10-01 DIAGNOSIS — E785 Hyperlipidemia, unspecified: Secondary | ICD-10-CM | POA: Diagnosis not present

## 2015-10-01 DIAGNOSIS — I10 Essential (primary) hypertension: Secondary | ICD-10-CM | POA: Diagnosis not present

## 2015-10-01 HISTORY — DX: Malignant neoplasm of unspecified part of right bronchus or lung: C34.91

## 2015-10-01 NOTE — Progress Notes (Signed)
Patient ID: [name], female   DOB: [birth year], 69 y.o.   MRN: [id number]  Chief Complaint  Patient presents with  . Lung Cancer     pre op    Referred By Dr. Ramond Craver he Reason for Referral right lower lobe mass  HPI Location, Quality, Duration, Severity, Timing, Context, Modifying Factors, Associated Signs and Symptoms.  [name] is a 69 y.o. female.  She returns today in follow-up. She's undergone a thyroid biopsy and a right lower lobe lung biopsy. The thyroid biopsy revealed thyroid carcinoma in the lung biopsy revealed an adenocarcinoma. She did have pulmonary function studies done which were adequate for surgery. She comes in today to discuss results of her biopsy. She tolerated her biopsy quite well. She did have an episode of hemoptysis afterwards but this is now cleared. She has not met with Dr. Tami Ribas since her diagnosis of lung cancer. She does have an occasional cough. This is about the same as it has been.   Past Medical History  Diagnosis Date  . Hyperlipidemia   . Hypertension   . GERD (gastroesophageal reflux disease)   . Groin pain     Along superior/laterl R inguinal canal.  Could be due to hernia or old scar tissue.  prev with CT done w/o alarming findings.  Will treat episodically.  Use tylenol #3 prn.    . Normal cardiac stress test 2014  . PONV (postoperative nausea and vomiting)     nausea after hysterectomy  . Thyroid nodule     Past Surgical History  Procedure Laterality Date  . Esophagogastroduodenoscopy  2002, 11/15/07    Normal (Dr. Allyn Kenner)  . Total vaginal hysterectomy  12/30/08    for fibroid pain (Dr. Gertie Fey)  . Facial cosmetic surgery  01/13/10    mini facelift  . Breast mass excision  1977    benign  . Hernia repair  1976  . Carpal tunnel release Right 1978  . Dilation and curettage of uterus  2001  . Colonoscopy  2009  . Abdominal hysterectomy  12-30-08  . Endobronchial ultrasound N/A 08/25/2015    Procedure: ENDOBRONCHIAL  ULTRASOUND;  Surgeon: Flora Lipps, MD;  Location: [facility];  Service: Cardiopulmonary;  Laterality: N/A;  . Electromagnetic navigation brochoscopy N/A 08/25/2015    Procedure: ELECTROMAGNETIC NAVIGATION BRONCHOSCOPY;  Surgeon: Flora Lipps, MD;  Location: [facility];  Service: Cardiopulmonary;  Laterality: N/A;    Family History  Problem Relation Age of Onset  . Transient ischemic attack Mother   . Hypertension Mother   . Stroke Mother     mini strokes  . Hypertension Sister   . Cancer Sister     ovarian  . Hypertension Sister   . Hypertension Sister   . Hypertension Sister   . Breast cancer Neg Hx   . Colon cancer Neg Hx     Social History Social History  Substance Use Topics  . Smoking status: Never Smoker   . Smokeless tobacco: Never Used  . Alcohol Use: No    Allergies  Allergen Reactions  . Atorvastatin     REACTION: myalgias  . Ciprofloxacin     REACTION: increase reflux  . Epinephrine     Heart racing with dental injection  . Flexeril [Cyclobenzaprine]     Ineffective for back pain  . Metoprolol Succinate Swelling    Swelling and stiffness in hands and ankles  . Simvastatin Other (See Comments)    Muscle aches and stiffness  . Sulfonamide Derivatives  REACTION: itching    Current Outpatient Prescriptions  Medication Sig Dispense Refill  . Ascorbic Acid (VITAMIN C) 500 MG tablet Take 500 mg by mouth daily.      . Biotin (PA BIOTIN) 1000 MCG tablet Take 1,000 mcg by mouth daily.    . Calcium Carbonate (CALCIUM 500 PO) Take by mouth daily.      . Cholecalciferol (VITAMIN D-3) 1000 UNITS CAPS Take 1 capsule by mouth daily.    . Coenzyme Q10 (COQ10) 50 MG CAPS Take 1 capsule by mouth daily.      . Folic Acid-Vit O2-UMP N36 (FA-VITAMIN B-6-VITAMIN B-12 PO) Take 1 tablet by mouth daily.      Marland Kitchen HYDROcodone-acetaminophen (NORCO/VICODIN) 5-325 MG tablet TAKE 1 TO 2 TABLETS EVERY 6 HOURS  0  . lisinopril (PRINIVIL,ZESTRIL) 2.5 MG tablet Take 1 tablet (2.5 mg total)  by mouth daily. 90 tablet 3  . omeprazole (PRILOSEC) 40 MG capsule Take 1 capsule (40 mg total) by mouth daily. 90 capsule 3  . potassium chloride (KLOR-CON) 8 MEQ tablet Take 8 mEq by mouth daily.    . pravastatin (PRAVACHOL) 40 MG tablet TAKE 1 TABLET EVERY DAY 90 tablet 3  . [DISCONTINUED] calcium gluconate 500 MG tablet Take 500 mg by mouth daily.      . [DISCONTINUED] CALCIUM-MAG-VIT C-VIT D PO Take by mouth daily.      . [DISCONTINUED] metoprolol succinate (TOPROL-XL) 25 MG 24 hr tablet Take 25 mg by mouth at bedtime.      . [DISCONTINUED] simvastatin (ZOCOR) 40 MG tablet Take 40 mg by mouth at bedtime.       No current facility-administered medications for this visit.      Review of Systems A complete review of systems was asked and was negative except for the following positive findings cough, slight hemoptysis, slight discomfort at the biopsy site.  Blood pressure 121/72, pulse 88, height 5' 5" (1.651 m), weight 121 lb 14.6 oz (55.3 kg), SpO2 98 %.  Physical Exam CONSTITUTIONAL:  Pleasant, well-developed, well-nourished, and in no acute distress. EYES: Pupils equal and reactive to light, Sclera non-icteric EARS, NOSE, MOUTH AND THROAT:  The oropharynx was clear.  Dentition is good repair.  Oral mucosa pink and moist. LYMPH NODES:  Lymph nodes in the neck and axillae were normal RESPIRATORY:  Lungs were clear.  Normal respiratory effort without pathologic use of accessory muscles of respiration CARDIOVASCULAR: Heart was regular without murmurs.  There were no carotid bruits. GI: The abdomen was soft, nontender, and nondistended. There were no palpable masses. There was no hepatosplenomegaly. There were normal bowel sounds in all quadrants. GU:  Rectal deferred.   MUSCULOSKELETAL:  Normal muscle strength and tone.  No clubbing or cyanosis.   SKIN:  There were no pathologic skin lesions.  There were no nodules on palpation. NEUROLOGIC:  Sensation is normal.  Cranial nerves are  grossly intact. PSYCH:  Oriented to person, place and time.  Mood and affect are normal.  Data Reviewed CT scan  I have personally reviewed the patient's imaging, laboratory findings and medical records.    Assessment    I have reviewed with the patient the indications and risks of right thoracotomy and lung resection. We also discussed the possibility of performing a more limited resection such as wedge or segment. I reviewed with her the indications and risks and advantages of all these different options. She would like to discuss her care with Dr. Tami Ribas regarding her thyroid lesion. She's concerned about  her husband and the ability to care for him.    Plan    I told her to contact Dr. Anastasia Pall office and discussed the thyroid surgery with him. I did not think it would be appropriate both at the same time and she will contact my office when she sees Dr. Tami Ribas.       Nestor Lewandowsky, MD 10/01/2015, 1:44 PM

## 2015-10-01 NOTE — Progress Notes (Signed)
[place] @ [facility] [phone]  [phone]  Follow-up visit  Camiah Humm OB: January 22, 1946  MR#: [id number]  KJZ#:[id number]  Patient Care Team: Tonia Ghent, MD as PCP - General (Family Medicine) Beverly Gust, MD (Unknown Physician Specialty) Nestor Lewandowsky, MD as Referring Physician (Cardiothoracic Surgery)  CHIEF COMPLAINT:  No chief complaint on file.  1.  Abnormal CT scan with thyroid mass and a right lower lobe lung mass which is PET positive. 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  Needle biopsy of right lower lobe lung mass is positive for adenocarcinoma of lung.  T1 N0 M0 tumor diagnosis in November of 2016 . VISIT DIAGNOSIS:     ICD-9-CM ICD-10-CM   1. Malignant neoplasm of lower lobe of right lung (HCC) 162.5 C34.31       No history exists.    Oncology Flowsheet 08/25/2015  dexamethasone (DECADRON) IJ -  ondansetron (ZOFRAN) IV -    INTERVAL HISTORY:  74 never smoker underwent evaluation of abdominal pain in April 2016 with a CT abd that revealed a 4 mm nodule in her RML. In further evaluation of this finding, a CT chest was performed 08/11/15 with finding of a different nodule in superior segment of RLL adjacent to vertebrae. This was followed by PET scan demonstrating hypermetabolic activity in lung nodule as well as ipsilateral nodes and in thyroid gland. She is referred for further eval. The only symptom that she reports that might be attributable to these findings is pain in her back btw scapulae. Patient underwent EBUS and the biopsy was negative for lymph node involvement  Needle biopsy of the right lower lobe lung nodule is positive for adenocarcinoma. Patient is being seen by me and thoracic surgeon. REVIEW OF SYSTEMS:    PERFORMANCE STATUS (ECOG):  0 HEENT:  No visual changes, runny nose,  sore throat, mouth sores or tenderness. Lungs: No shortness of breath or cough.  No hemoptysis. Cardiac:  No chest pain, palpitations, orthopnea, or PND. GI:  No nausea, vomiting, diarrhea, constipation, melena or hematochezia. GU:  No urgency, frequency, dysuria, or hematuria. Musculoskeletal:  No back pain.  No joint pain.  No muscle tenderness. Extremities:  No pain or swelling. Skin:  No rashes or skin changes. Neuro:  No headache, numbness or weakness, balance or coordination issues. Endocrine:  No diabetes, thyroid issues, hot flashes or night sweats. Psych:  No mood changes, depression or anxiety. Pain:  No focal pain. Review of systems:  All other systems reviewed and found to be negative.  As per HPI. Otherwise, a complete review of systems is negatve.  PAST MEDICAL HISTORY: Past Medical History  Diagnosis Date  . Hyperlipidemia   . Hypertension   . GERD (gastroesophageal reflux disease)   . Groin pain     Along superior/laterl R inguinal canal.  Could be due to hernia or old scar tissue.  prev with CT done w/o alarming findings.  Will treat episodically.  Use tylenol #3 prn.    . Normal cardiac stress test 2014  . PONV (postoperative nausea and vomiting)     nausea after hysterectomy  . Thyroid nodule   . Cancer of lung ([place]) 10/01/2015    PAST SURGICAL HISTORY: Past Surgical History  Procedure Laterality Date  . Esophagogastroduodenoscopy  2002, 11/15/07    Normal (Dr. Allyn Kenner)  . Total vaginal hysterectomy  12/30/08  for fibroid pain (Dr. Gertie Fey)  . Facial cosmetic surgery  01/13/10    mini facelift  . Breast mass excision  1977    benign  . Hernia repair  1976  . Carpal tunnel release Right 1978  . Dilation and curettage of uterus  2001  . Colonoscopy  2009  . Abdominal hysterectomy  12-30-08  . Endobronchial ultrasound N/A 08/25/2015    Procedure: ENDOBRONCHIAL ULTRASOUND;  Surgeon: Flora Lipps, MD;  Location: [facility] ORS;  Service: Cardiopulmonary;  Laterality:  N/A;  . Electromagnetic navigation brochoscopy N/A 08/25/2015    Procedure: ELECTROMAGNETIC NAVIGATION BRONCHOSCOPY;  Surgeon: Flora Lipps, MD;  Location: [facility] ORS;  Service: Cardiopulmonary;  Laterality: N/A;    FAMILY HISTORY Family History  Problem Relation Age of Onset  . Transient ischemic attack Mother   . Hypertension Mother   . Stroke Mother     mini strokes  . Hypertension Sister   . Cancer Sister     ovarian  . Hypertension Sister   . Hypertension Sister   . Hypertension Sister   . Breast cancer Neg Hx   . Colon cancer Neg Hx        ADVANCED DIRECTIVES:  Patient does not have any living will or healthcare power of attorney.  Information was given .  Available resources had been discussed.  We will follow-up on subsequent appointments regarding this issue  HEALTH MAINTENANCE: Social History  Substance Use Topics  . Smoking status: Never Smoker   . Smokeless tobacco: Never Used  . Alcohol Use: No     Colonoscopy:  PAP:  Bone density:  Lipid panel:  Allergies  Allergen Reactions  . Atorvastatin     REACTION: myalgias  . Ciprofloxacin     REACTION: increase reflux  . Epinephrine     Heart racing with dental injection  . Flexeril [Cyclobenzaprine]     Ineffective for back pain  . Metoprolol Succinate Swelling    Swelling and stiffness in hands and ankles  . Simvastatin Other (See Comments)    Muscle aches and stiffness  . Sulfonamide Derivatives     REACTION: itching    Current Outpatient Prescriptions  Medication Sig Dispense Refill  . Ascorbic Acid (VITAMIN C) 500 MG tablet Take 500 mg by mouth daily.      . Biotin (PA BIOTIN) 1000 MCG tablet Take 1,000 mcg by mouth daily.    . Calcium Carbonate (CALCIUM 500 PO) Take by mouth daily.      . Cholecalciferol (VITAMIN D-3) 1000 UNITS CAPS Take 1 capsule by mouth daily.    . Coenzyme Q10 (COQ10) 50 MG CAPS Take 1 capsule by mouth daily.      . Folic Acid-Vit C3-JSE G31 (FA-VITAMIN B-6-VITAMIN B-12  PO) Take 1 tablet by mouth daily.      Marland Kitchen HYDROcodone-acetaminophen (NORCO/VICODIN) 5-325 MG tablet TAKE 1 TO 2 TABLETS EVERY 6 HOURS  0  . lisinopril (PRINIVIL,ZESTRIL) 2.5 MG tablet Take 1 tablet (2.5 mg total) by mouth daily. 90 tablet 3  . omeprazole (PRILOSEC) 40 MG capsule Take 1 capsule (40 mg total) by mouth daily. 90 capsule 3  . potassium chloride (KLOR-CON) 8 MEQ tablet Take 8 mEq by mouth daily.    . pravastatin (PRAVACHOL) 40 MG tablet TAKE 1 TABLET EVERY DAY 90 tablet 3  . [DISCONTINUED] calcium gluconate 500 MG tablet Take 500 mg by mouth daily.      . [DISCONTINUED] CALCIUM-MAG-VIT C-VIT D PO Take by mouth daily.      . [  DISCONTINUED] metoprolol succinate (TOPROL-XL) 25 MG 24 hr tablet Take 25 mg by mouth at bedtime.      . [DISCONTINUED] simvastatin (ZOCOR) 40 MG tablet Take 40 mg by mouth at bedtime.       No current facility-administered medications for this visit.    OBJECTIVE: PHYSICAL EXAM: GENERAL:  Well developed, well nourished, sitting comfortably in the exam room in no acute distress. MENTAL STATUS:  Alert and oriented to person, place and time. HEAD: .  Normocephalic, atraumatic, face symmetric, no Cushingoid features. EYES:.  Pupils equal round and reactive to light and accomodation.  No conjunctivitis or scleral icterus. ENT:  Oropharynx clear without lesion.  Tongue normal. Mucous membranes moist.  No palpable thyroid nodule RESPIRATORY:  Clear to auscultation without rales, wheezes or rhonchi. CARDIOVASCULAR:  Regular rate and rhythm without murmur, rub or gallop. BREAST:  Right breast without masses, skin changes or nipple discharge.  Left breast without masses, skin changes or nipple discharge. ABDOMEN:  Soft, non-tender, with active bowel sounds, and no hepatosplenomegaly.  No masses. BACK:  No CVA tenderness.  No tenderness on percussion of the back or rib cage. SKIN:  No rashes, ulcers or lesions. EXTREMITIES: No edema, no skin discoloration or  tenderness.  No palpable cords. LYMPH NODES: No palpable cervical, supraclavicular, axillary or inguinal adenopathy  NEUROLOGICAL: Unremarkable. PSYCH:  Appropriate.  There were no vitals filed for this visit.   There is no weight on file to calculate BMI.    ECOG FS:0 - Asymptomatic  LAB RESULTS:  No visits with results within 5 Day(s) from this visit. Latest known visit with results is:  Hospital Outpatient Visit on 09/21/2015  Component Date Value Ref Range Status  . WBC 09/21/2015 5.5  3.6 - 11.0 K/uL Final  . RBC 09/21/2015 4.65  3.80 - 5.20 MIL/uL Final  . Hemoglobin 09/21/2015 14.0  12.0 - 16.0 g/dL Final  . HCT 09/21/2015 42.3  35.0 - 47.0 % Final  . MCV 09/21/2015 91.0  80.0 - 100.0 fL Final  . MCH 09/21/2015 30.0  26.0 - 34.0 pg Final  . MCHC 09/21/2015 33.0  32.0 - 36.0 g/dL Final  . RDW 09/21/2015 14.2  11.5 - 14.5 % Final  . Platelets 09/21/2015 148* 150 - 440 K/uL Final  . Neutrophils Relative % 09/21/2015 60   Final  . Neutro Abs 09/21/2015 3.3  1.4 - 6.5 K/uL Final  . Lymphocytes Relative 09/21/2015 29   Final  . Lymphs Abs 09/21/2015 1.6  1.0 - 3.6 K/uL Final  . Monocytes Relative 09/21/2015 8   Final  . Monocytes Absolute 09/21/2015 0.5  0.2 - 0.9 K/uL Final  . Eosinophils Relative 09/21/2015 2   Final  . Eosinophils Absolute 09/21/2015 0.1  0 - 0.7 K/uL Final  . Basophils Relative 09/21/2015 1   Final  . Basophils Absolute 09/21/2015 0.0  0 - 0.1 K/uL Final  . Prothrombin Time 09/21/2015 13.6  11.4 - 15.0 seconds Final  . INR 09/21/2015 1.02   Final  . aPTT 09/21/2015 31  24 - 36 seconds Final  . SURGICAL PATHOLOGY 09/21/2015    Final                   Value:Surgical Pathology CASE: [id number] PATIENT: Drucie Opitz Surgical Pathology Report     SPECIMEN SUBMITTED: A. Lung, right lower lobe, CT-guided core biopsy  CLINICAL HISTORY: right lower lobe lung mass  PRE-OPERATIVE DIAGNOSIS: Right lower lobe lung lesion  POST-OPERATIVE  DIAGNOSIS: None  provided     DIAGNOSIS: A. LUNG, RIGHT LOWER LOBE; CT-GUIDED CORE BIOPSY: - ADENOCARCINOMA, ACINAR AND LEPIDIC PATTERNS.  Comment: Immunohistochemistry (IHC) was performed due to the history of thyroid carcinoma ([id number], FNA collected 09/08/15, slides were reviewed). The lung tumor cells show diffuse strong nuclear staining for TTF-1, and they are negative for PAX8 and thyroglobulin.  The histologic features and IHC profile support a diagnosis of adenocarcinoma of lung origin.  The final diagnosis was called to Dr. Oliva Bustard and Dr. Genevive Bi on 09/30/15, at 11:26 AM and 11:29 AM respectively.  IHC slides were prepared by [facility] a                         nd Pathology, [facility], [place] (TTF-1 and PAX8), and [facility], [place], [place] (thyroglobulin). All controls stained appropriately.  GROSS DESCRIPTION: A. Labeled: Right lower lobe lung lesion  Tissue fragment(s): 4  Size: 0.8-1.2 cm in length by 0.1 cm in diameter  Description: Pink red cylindrically shaped tissue fragments, inked blue  Entirely submitted in 2 cassette(s).     Final Diagnosis performed by Bryan Lemma, MD.  Electronically signed 09/30/2015 11:44:46AM    The electronic signature indicates that the named Attending Pathologist has evaluated the specimen  Technical component performed at [facility], [address] [place], [place] [address] Lab: [id number] Dir: Darrick Penna. Evette Doffing, MD  Professional component performed at [facility], [facility], [place], [place], [place] [address] Lab: [phone] Dir: Dellia Nims. Rubinas, MD      STUDIES: Dg Chest 1 View  09/21/2015  CLINICAL DATA:  Status post right lung biopsy. EXAM: CHEST 1 VIEW COMPARISON:  June 30, 2013. FINDINGS: The heart size and mediastinal contours are within normal limits. Left lung is clear. No pneumothorax is noted. Increased right basilar opacity and slightly  increased right upper lobe opacity is noted most consistent with pulmonary hemorrhage related to biopsy. The visualized skeletal structures are unremarkable. IMPRESSION: No pneumothorax seen status post right lung biopsy. Pulmonary hemorrhage is noted in the right lung. Electronically Signed   By: Marijo Conception, M.D.   On: 09/21/2015 12:35   US Thyroid Biopsy  09/08/2015  CLINICAL DATA:  Abnormal PET-CT with hypermetabolic right thyroid nodule EXAM: ULTRASOUND GUIDED NEEDLE ASPIRATE BIOPSY OF THE THYROID GLAND COMPARISON:  None. PROCEDURE: Thyroid biopsy was thoroughly discussed with the patient and questions were answered. The benefits, risks, alternatives, and complications were also discussed. The patient understands and wishes to proceed with the procedure. Written consent was obtained. Ultrasound was performed to localize and mark an adequate site for the biopsy. The patient was then prepped and draped in a normal sterile fashion. Local anesthesia was provided with 1% lidocaine. Using direct ultrasound guidance, 4 passes were made using needles into the nodule within the right lobe of the thyroid. Ultrasound was used to confirm needle placements on all occasions. Specimens were confirmed to be adequate by pathology. Additionally due to the somewhat extrinsic appearance of the lesion a single core biopsy pass was performed and sent in formalin. The patient tolerated the procedure well and was returned to her room in satisfactory condition. COMPLICATIONS: None. FINDINGS: Initial survey of the thyroid was performed and reveals a 1.1 cm well-circumscribed nodule arising along the lateral aspect of the right lobe of the thyroid. On some of the transverse imaging, it appears as though it may be extrinsic to the thyroid impinging into the gland. For this reason, the  core biopsy was obtained in addition to the fine-needle aspirate. A few scattered smaller nodules are noted within the right lobe of the thyroid  which were also not metabolically active. Scattered nodules are noted throughout the left lobe of the thyroid none of which appeared hypermetabolic on the prior exam. The largest of these measures 11 mm in greatest dimension. IMPRESSION: Ultrasound guided needle aspirate and core biopsy performed of the right thyroid nodule. Bilateral thyroid nodules which were not hypermetabolic on recent PET-CT and do not meet biopsy criteria. Electronically Signed   By: Inez Catalina M.D.   On: 09/08/2015 10:02   Ct Biopsy  09/21/2015  CLINICAL DATA:  Right lower lobe lung mass. EXAM: CT GUIDED core BIOPSY OF right lower lobe lung mass. ANESTHESIA/SEDATION: 2.0  Mg IV Versed; 50 mcg IV Fentanyl Total Moderate Sedation Time: 18 minutes. PROCEDURE: The procedure risks, benefits, and alternatives were explained to the patient. Questions regarding the procedure were encouraged and answered. The patient understands and consents to the procedure. The right posterior chest was prepped with chlorhexidinein a sterile fashion, and a sterile drape was applied covering the operative field. Sterile gloves were used for the procedure. Local anesthesia was provided with 1% Lidocaine. Under CT guidance, 17 gauge trocar was directed toward edge of lesion in superior segment of right lower lobe. Four core samples were obtained using 18 gauge biopsy needle. These were placed in the formalin vial and delivered to pathology. The patient immediately developed pulmonary hemorrhage around the lesion which also extended into the right middle lobe. The patient developed hemoptysis is well. Needle was removed with the simultaneous injection of approximately 10 cc of the patient's own blood drawn prior to the procedure to serve as blood patch. Appropriate dressing was applied. Complications: No pneumothorax was noted on follow-up imaging. Pulmonary hemorrhage was noted as described above. FINDINGS: Spiculated lesion seen in superior segment of right  lower lobe. IMPRESSION: Under CT guidance, percutaneous biopsy of spiculated lesion in superior segment of right lower lobe was performed. Patient developed pulmonary hemorrhage immediately after first core biopsy sample with hemoptysis. No pneumothorax was noted on follow-up imaging. Electronically Signed   By: Marijo Conception, M.D.   On: 09/21/2015 10:51    ASSESSMENT:  1.  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  ,  Right lower lobe lung mass biopsies positive for adenocarcinoma T1 N0 M0 tumor. Stage I disease. I had prolonged discussion with patient.  Possibility of surgical intervention for both thyroid as well as lung mass was suggested.  Patient is going to see Dr. Tami Ribas and Dr. Faith Rogue and I had discussion with both physician today. I will reevaluate patient after surgical intervention is done      Patient expressed understanding and was in agreement with this plan. She also understands that She can call clinic at any time with any questions, concerns, or complaints.    No matching staging information was found for the patient.  Forest Gleason, MD   10/01/2015 7:26 PM

## 2015-10-15 ENCOUNTER — Inpatient Hospital Stay (HOSPITAL_BASED_OUTPATIENT_CLINIC_OR_DEPARTMENT_OTHER): Payer: Commercial Managed Care - HMO | Admitting: Cardiothoracic Surgery

## 2015-10-15 VITALS — BP 127/73 | HR 88 | Temp 98.3°F | Ht 65.0 in | Wt 122.8 lb

## 2015-10-15 DIAGNOSIS — C3431 Malignant neoplasm of lower lobe, right bronchus or lung: Secondary | ICD-10-CM | POA: Diagnosis not present

## 2015-10-15 DIAGNOSIS — R918 Other nonspecific abnormal finding of lung field: Secondary | ICD-10-CM | POA: Diagnosis not present

## 2015-10-15 NOTE — Progress Notes (Signed)
Patient ID: [name], female   DOB: [birth year], 69 y.o.   MRN: [id number]  Chief Complaint  Patient presents with  . Lung Cancer    Referred By Dr. Ramond Craver he Reason for Referral right lower lobe mass  HPI Location, Quality, Duration, Severity, Timing, Context, Modifying Factors, Associated Signs and Symptoms.  [name] is a 69 y.o. female.  This is a 69 year old female with both a right lower lobe mass and a thyroid mass. Biopsies confirmed carcinoma. She had a set of pulmonary function studies done which reveal an FEV1 and a DLCO of approximate 70% each. She had a long discussion with Dr. Beverly Gust regarding her thyroid mass. He has recommended that she undergo lung resection first followed by thyroid resection. I'm in agreement with that. She comes in today and states that she's been doing reasonably well otherwise. She has no new problems or concerns. She had a 10 pound weight loss but this has been stable over the last couple weeks. She denies any fevers or chills.   Past Medical History  Diagnosis Date  . Hyperlipidemia   . Hypertension   . GERD (gastroesophageal reflux disease)   . Groin pain     Along superior/laterl R inguinal canal.  Could be due to hernia or old scar tissue.  prev with CT done w/o alarming findings.  Will treat episodically.  Use tylenol #3 prn.    . Normal cardiac stress test 2014  . PONV (postoperative nausea and vomiting)     nausea after hysterectomy  . Thyroid nodule   . Cancer of lung ([place]) 10/01/2015    Past Surgical History  Procedure Laterality Date  . Esophagogastroduodenoscopy  2002, 11/15/07    Normal (Dr. Allyn Kenner)  . Total vaginal hysterectomy  12/30/08    for fibroid pain (Dr. Gertie Fey)  . Facial cosmetic surgery  01/13/10    mini facelift  . Breast mass excision  1977    benign  . Hernia repair  1976  . Carpal tunnel release Right 1978  . Dilation and curettage of uterus  2001  . Colonoscopy  2009  . Abdominal hysterectomy   12-30-08  . Endobronchial ultrasound N/A 08/25/2015    Procedure: ENDOBRONCHIAL ULTRASOUND;  Surgeon: Flora Lipps, MD;  Location: [facility] ORS;  Service: Cardiopulmonary;  Laterality: N/A;  . Electromagnetic navigation brochoscopy N/A 08/25/2015    Procedure: ELECTROMAGNETIC NAVIGATION BRONCHOSCOPY;  Surgeon: Flora Lipps, MD;  Location: [facility] ORS;  Service: Cardiopulmonary;  Laterality: N/A;    Family History  Problem Relation Age of Onset  . Transient ischemic attack Mother   . Hypertension Mother   . Stroke Mother     mini strokes  . Hypertension Sister   . Cancer Sister     ovarian  . Hypertension Sister   . Hypertension Sister   . Hypertension Sister   . Breast cancer Neg Hx   . Colon cancer Neg Hx     Social History Social History  Substance Use Topics  . Smoking status: Never Smoker   . Smokeless tobacco: Never Used  . Alcohol Use: No    Allergies  Allergen Reactions  . Atorvastatin     REACTION: myalgias  . Ciprofloxacin     REACTION: increase reflux  . Epinephrine     Heart racing with dental injection  . Flexeril [Cyclobenzaprine]     Ineffective for back pain  . Metoprolol Succinate Swelling    Swelling and stiffness in hands and ankles  . Simvastatin Other (See  Comments)    Muscle aches and stiffness  . Sulfonamide Derivatives     REACTION: itching    Current Outpatient Prescriptions  Medication Sig Dispense Refill  . Ascorbic Acid (VITAMIN C) 500 MG tablet Take 500 mg by mouth daily.      . Biotin (PA BIOTIN) 1000 MCG tablet Take 1,000 mcg by mouth daily.    . Calcium Carbonate (CALCIUM 500 PO) Take by mouth daily.      . Cholecalciferol (VITAMIN D-3) 1000 UNITS CAPS Take 1 capsule by mouth daily.    . Coenzyme Q10 (COQ10) 50 MG CAPS Take 1 capsule by mouth daily.      . Folic Acid-Vit Z6-XWR U04 (FA-VITAMIN B-6-VITAMIN B-12 PO) Take 1 tablet by mouth daily.      Marland Kitchen HYDROcodone-acetaminophen (NORCO/VICODIN) 5-325 MG tablet TAKE 1 TO 2 TABLETS EVERY 6  HOURS  0  . lisinopril (PRINIVIL,ZESTRIL) 2.5 MG tablet Take 1 tablet (2.5 mg total) by mouth daily. 90 tablet 3  . omeprazole (PRILOSEC) 40 MG capsule Take 1 capsule (40 mg total) by mouth daily. 90 capsule 3  . potassium chloride (KLOR-CON) 8 MEQ tablet Take 8 mEq by mouth daily.    . pravastatin (PRAVACHOL) 40 MG tablet TAKE 1 TABLET EVERY DAY 90 tablet 3  . [DISCONTINUED] calcium gluconate 500 MG tablet Take 500 mg by mouth daily.      . [DISCONTINUED] CALCIUM-MAG-VIT C-VIT D PO Take by mouth daily.      . [DISCONTINUED] metoprolol succinate (TOPROL-XL) 25 MG 24 hr tablet Take 25 mg by mouth at bedtime.      . [DISCONTINUED] simvastatin (ZOCOR) 40 MG tablet Take 40 mg by mouth at bedtime.       No current facility-administered medications for this visit.      Review of Systems A complete review of systems was asked and was negative except for the following positive findings no fevers or chills. No shortness of breath. No cough.  Blood pressure 127/73, pulse 88, temperature 98.3 F (36.8 C), temperature source Tympanic, height '5\' 5"'$  (1.651 m), weight 122 lb 12.7 oz (55.7 kg), SpO2 98 %.  Physical Exam CONSTITUTIONAL:  Pleasant, well-developed, well-nourished, and in no acute distress. EYES: Pupils equal and reactive to light, Sclera non-icteric EARS, NOSE, MOUTH AND THROAT:  The oropharynx was clear.  Dentition is good repair.  Oral mucosa pink and moist. LYMPH NODES:  Lymph nodes in the neck and axillae were normal RESPIRATORY:  Lungs were clear.  Normal respiratory effort without pathologic use of accessory muscles of respiration CARDIOVASCULAR: Heart was regular without murmurs.  There were no carotid bruits. GI: The abdomen was soft, nontender, and nondistended. There were no palpable masses. There was no hepatosplenomegaly. There were normal bowel sounds in all quadrants. GU:  Rectal deferred.   MUSCULOSKELETAL:  Normal muscle strength and tone.  No clubbing or cyanosis.    SKIN:  There were no pathologic skin lesions.  There were no nodules on palpation. NEUROLOGIC:  Sensation is normal.  Cranial nerves are grossly intact. PSYCH:  Oriented to person, place and time.  Mood and affect are normal.  Data Reviewed Chest CT and PET scan  I have personally reviewed the patient's imaging, laboratory findings and medical records.    Assessment    I had a long discussion today with the patient regarding the management of her right lower lobe mass. We reviewed in extensive detail the indications and risks of thoracotomy with wedge resection versus segmentectomy versus lobectomy.  I told her I would need to reassess once again in the operating room for definitive management.    Plan    I have a long discussion with her today she is agreed to surgery. Preoperative consent was obtained. All of her labs will be obtained as well.      Nestor Lewandowsky, MD 10/15/2015, 10:08 AM

## 2015-10-16 ENCOUNTER — Telehealth: Payer: Self-pay | Admitting: Cardiothoracic Surgery

## 2015-10-16 NOTE — Telephone Encounter (Signed)
Pt advised of pre op date/time and sx date. Sx: 10/27/15 with Dr Greggory Brandy thoracotomy with lung resection--pre op bronch. Dr Azalee Course assisting.  Pre op: 10/20/15 @ 10:45am.

## 2015-10-20 ENCOUNTER — Encounter
Admission: RE | Admit: 2015-10-20 | Discharge: 2015-10-20 | Disposition: A | Discharge status: Home / Self Care | Payer: Commercial Managed Care - HMO | Source: Ambulatory Visit | Attending: Cardiothoracic Surgery | Admitting: Cardiothoracic Surgery

## 2015-10-20 ENCOUNTER — Ambulatory Visit
Admission: RE | Admit: 2015-10-20 | Discharge: 2015-10-20 | Disposition: A | Discharge status: Home / Self Care | Payer: Commercial Managed Care - HMO | Source: Ambulatory Visit | Attending: Cardiothoracic Surgery | Admitting: Cardiothoracic Surgery

## 2015-10-20 DIAGNOSIS — Z01818 Encounter for other preprocedural examination: Secondary | ICD-10-CM | POA: Diagnosis not present

## 2015-10-20 LAB — COMPREHENSIVE METABOLIC PANEL
ALT: 20 U/L (ref 14–54)
AST: 21 U/L (ref 15–41)
Albumin: 3.9 g/dL (ref 3.5–5.0)
Alkaline Phosphatase: 47 U/L (ref 38–126)
Anion gap: 6 (ref 5–15)
BUN: 20 mg/dL (ref 6–20)
CO2: 29 mmol/L (ref 22–32)
Calcium: 9.2 mg/dL (ref 8.9–10.3)
Chloride: 104 mmol/L (ref 101–111)
Creatinine, Ser: 0.63 mg/dL (ref 0.44–1.00)
GFR calc Af Amer: >60 mL/min (ref >60)
GFR calc non Af Amer: >60 mL/min (ref >60)
Glucose, Bld: 93 mg/dL (ref 65–99)
Potassium: 4.2 mmol/L (ref 3.5–5.1)
Sodium: 139 mmol/L (ref 135–145)
Total Bilirubin: 1 mg/dL (ref 0.3–1.2)
Total Protein: 6.3 g/dL — ABNORMAL LOW (ref 6.5–8.1)

## 2015-10-20 LAB — CBC
HCT: 39.9 % (ref 35.0–47.0)
Hemoglobin: 13.1 g/dL (ref 12.0–16.0)
MCH: 29.9 pg (ref 26.0–34.0)
MCHC: 32.8 g/dL (ref 32.0–36.0)
MCV: 91.2 fL (ref 80.0–100.0)
Platelets: 135 10*3/uL — ABNORMAL LOW (ref 150–440)
RBC: 4.37 MIL/uL (ref 3.80–5.20)
RDW: 14.2 % (ref 11.5–14.5)
WBC: 6.5 10*3/uL (ref 3.6–11.0)

## 2015-10-20 LAB — PROTIME-INR
INR: 1.07
Prothrombin Time: 14.1 seconds (ref 11.4–15.0)

## 2015-10-20 LAB — SURGICAL PCR SCREEN
MRSA, PCR: NEGATIVE
Staphylococcus aureus: NEGATIVE

## 2015-10-20 LAB — APTT: aPTT: 34 seconds (ref 24–36)

## 2015-10-20 LAB — ABO/RH: ABO/RH(D): A POS

## 2015-10-20 NOTE — Patient Instructions (Signed)
  Your procedure is scheduled on: Tuesday 10/27/2015 Report to Day Surgery. 2ND FLOOR MEDICAL MALL ENTRANCE To find out your arrival time please call [phone] between 1PM - 3PM on Friday 10/23/2015  Remember: Instructions that are not followed completely may result in serious medical risk, up to and including death, or upon the discretion of your surgeon and anesthesiologist your surgery may need to be rescheduled.    __X__ 1. Do not eat food or drink liquids after midnight. No gum chewing or hard candies.     __X__ 2. No Alcohol for 24 hours before or after surgery.   ____ 3. Bring all medications with you on the day of surgery if instructed.    __X__ 4. Notify your doctor if there is any change in your medical condition     (cold, fever, infections).     Do not wear jewelry, make-up, hairpins, clips or nail polish.  Do not wear lotions, powders, or perfumes.   Do not shave 48 hours prior to surgery. Men may shave face and neck.  Do not bring valuables to the hospital.    [facility] is not responsible for any belongings or valuables.               Contacts, dentures or bridgework may not be worn into surgery.  Leave your suitcase in the car. After surgery it may be brought to your room.  For patients admitted to the hospital, discharge time is determined by your                treatment team.   Patients discharged the day of surgery will not be allowed to drive home.   Please read over the following fact sheets that you were given:   MRSA Information and Surgical Site Infection Prevention   __X__ Take these medicines the morning of surgery with A SIP OF WATER:    1. OMEPRAZOLE  2.   3.   4.  5.  6.  ____ Fleet Enema (as directed)   __X__ Use CHG Soap as directed  ____ Use inhalers on the day of surgery  ____ Stop metformin 2 days prior to surgery    ____ Take 1/2 of usual insulin dose the night before surgery and none on the morning of surgery.   ____ Stop  Coumadin/Plavix/aspirin on   ____ Stop Anti-inflammatories on    ____ Stop supplements until after surgery.    ____ Bring C-Pap to the hospital.

## 2015-10-21 ENCOUNTER — Telehealth: Payer: Self-pay | Admitting: *Deleted

## 2015-10-21 NOTE — Telephone Encounter (Signed)
Patient would like clarification about crossmatch for blood being done yesterday instead of the morning of the surgery as planned. Discussed with Cherie, preadmission nurse, and she reports that type and screen are good for 14 days and crossmatch would be released the day before surgery. Will notify patient of this.

## 2015-10-26 LAB — PREPARE RBC (CROSSMATCH)

## 2015-10-27 ENCOUNTER — Inpatient Hospital Stay: Payer: Commercial Managed Care - HMO | Admitting: Anesthesiology

## 2015-10-27 ENCOUNTER — Inpatient Hospital Stay
Admission: RE | Admit: 2015-10-27 | Discharge: 2015-11-03 | DRG: 164 | Disposition: A | Discharge status: Home / Self Care | Payer: Commercial Managed Care - HMO | Source: Ambulatory Visit | Attending: Cardiothoracic Surgery | Admitting: Cardiothoracic Surgery

## 2015-10-27 ENCOUNTER — Encounter
Admission: RE | Disposition: A | Discharge status: Home / Self Care | Payer: Self-pay | Source: Ambulatory Visit | Attending: Cardiothoracic Surgery

## 2015-10-27 ENCOUNTER — Encounter: Payer: Self-pay | Admitting: *Deleted

## 2015-10-27 ENCOUNTER — Inpatient Hospital Stay: Payer: Commercial Managed Care - HMO

## 2015-10-27 DIAGNOSIS — Z85118 Personal history of other malignant neoplasm of bronchus and lung: Secondary | ICD-10-CM

## 2015-10-27 DIAGNOSIS — J95811 Postprocedural pneumothorax: Secondary | ICD-10-CM | POA: Diagnosis not present

## 2015-10-27 DIAGNOSIS — Z09 Encounter for follow-up examination after completed treatment for conditions other than malignant neoplasm: Secondary | ICD-10-CM

## 2015-10-27 DIAGNOSIS — J90 Pleural effusion, not elsewhere classified: Secondary | ICD-10-CM | POA: Diagnosis not present

## 2015-10-27 DIAGNOSIS — R634 Abnormal weight loss: Secondary | ICD-10-CM | POA: Diagnosis present

## 2015-10-27 DIAGNOSIS — R079 Chest pain, unspecified: Secondary | ICD-10-CM | POA: Diagnosis not present

## 2015-10-27 DIAGNOSIS — K219 Gastro-esophageal reflux disease without esophagitis: Secondary | ICD-10-CM | POA: Diagnosis present

## 2015-10-27 DIAGNOSIS — Z888 Allergy status to other drugs, medicaments and biological substances status: Secondary | ICD-10-CM | POA: Diagnosis not present

## 2015-10-27 DIAGNOSIS — R339 Retention of urine, unspecified: Secondary | ICD-10-CM | POA: Diagnosis not present

## 2015-10-27 DIAGNOSIS — Z79899 Other long term (current) drug therapy: Secondary | ICD-10-CM | POA: Diagnosis not present

## 2015-10-27 DIAGNOSIS — Z9689 Presence of other specified functional implants: Secondary | ICD-10-CM

## 2015-10-27 DIAGNOSIS — E041 Nontoxic single thyroid nodule: Secondary | ICD-10-CM | POA: Diagnosis present

## 2015-10-27 DIAGNOSIS — C3490 Malignant neoplasm of unspecified part of unspecified bronchus or lung: Secondary | ICD-10-CM | POA: Diagnosis not present

## 2015-10-27 DIAGNOSIS — R918 Other nonspecific abnormal finding of lung field: Secondary | ICD-10-CM | POA: Diagnosis not present

## 2015-10-27 DIAGNOSIS — J939 Pneumothorax, unspecified: Secondary | ICD-10-CM | POA: Diagnosis not present

## 2015-10-27 DIAGNOSIS — Z9889 Other specified postprocedural states: Secondary | ICD-10-CM

## 2015-10-27 DIAGNOSIS — Z4682 Encounter for fitting and adjustment of non-vascular catheter: Secondary | ICD-10-CM | POA: Diagnosis not present

## 2015-10-27 DIAGNOSIS — Y838 Other surgical procedures as the cause of abnormal reaction of the patient, or of later complication, without mention of misadventure at the time of the procedure: Secondary | ICD-10-CM | POA: Diagnosis not present

## 2015-10-27 DIAGNOSIS — I10 Essential (primary) hypertension: Secondary | ICD-10-CM | POA: Diagnosis present

## 2015-10-27 DIAGNOSIS — E785 Hyperlipidemia, unspecified: Secondary | ICD-10-CM | POA: Diagnosis present

## 2015-10-27 DIAGNOSIS — C3431 Malignant neoplasm of lower lobe, right bronchus or lung: Principal | ICD-10-CM | POA: Diagnosis present

## 2015-10-27 DIAGNOSIS — Z882 Allergy status to sulfonamides status: Secondary | ICD-10-CM

## 2015-10-27 DIAGNOSIS — R34 Anuria and oliguria: Secondary | ICD-10-CM | POA: Diagnosis present

## 2015-10-27 DIAGNOSIS — J9383 Other pneumothorax: Secondary | ICD-10-CM | POA: Diagnosis not present

## 2015-10-27 HISTORY — PX: THORACOTOMY/LOBECTOMY: SHX6116

## 2015-10-27 LAB — PREPARE RBC (CROSSMATCH)

## 2015-10-27 LAB — GLUCOSE, CAPILLARY: Glucose-Capillary: 173 mg/dL — ABNORMAL HIGH (ref 65–99)

## 2015-10-27 SURGERY — LOBECTOMY, LUNG, OPEN
Anesthesia: General | Laterality: Right | Wound class: Clean Contaminated

## 2015-10-27 MED ORDER — FAMOTIDINE 20 MG PO TABS
ORAL_TABLET | ORAL | Status: AC
Start: 2015-10-27 — End: 2015-10-27
  Filled 2015-10-27: qty 1

## 2015-10-27 MED ORDER — OXYCODONE HCL 5 MG PO TABS: 5.0000 mg | ORAL_TABLET | Freq: Once | ORAL | Status: DC | PRN

## 2015-10-27 MED ORDER — BUPIVACAINE HCL (PF) 0.25 % IJ SOLN
INTRAMUSCULAR | Status: AC
  Filled 2015-10-27: qty 30

## 2015-10-27 MED ORDER — MIDAZOLAM HCL 2 MG/2ML IJ SOLN
INTRAMUSCULAR | Status: DC | PRN
  Administered 2015-10-27: 2 mg via INTRAVENOUS

## 2015-10-27 MED ORDER — PANTOPRAZOLE SODIUM 40 MG PO TBEC
40.0000 mg | DELAYED_RELEASE_TABLET | Freq: Every day | ORAL | Status: DC
  Administered 2015-10-28 – 2015-10-29 (×2): 40 mg via ORAL
  Filled 2015-10-27 (×2): qty 1

## 2015-10-27 MED ORDER — DEXTROSE 5 % IV SOLN
1.5000 g | Freq: Two times a day (BID) | INTRAVENOUS | Status: AC
  Administered 2015-10-28 (×2): 1.5 g via INTRAVENOUS
  Filled 2015-10-27 (×2): qty 1.5

## 2015-10-27 MED ORDER — ALBUTEROL SULFATE (2.5 MG/3ML) 0.083% IN NEBU: 2.5000 mg | INHALATION_SOLUTION | RESPIRATORY_TRACT | Status: DC

## 2015-10-27 MED ORDER — FENTANYL CITRATE (PF) 100 MCG/2ML IJ SOLN
INTRAMUSCULAR | Status: AC
  Filled 2015-10-27: qty 2

## 2015-10-27 MED ORDER — ACETAMINOPHEN 10 MG/ML IV SOLN
INTRAVENOUS | Status: AC
  Filled 2015-10-27: qty 100

## 2015-10-27 MED ORDER — SUGAMMADEX SODIUM 200 MG/2ML IV SOLN
INTRAVENOUS | Status: DC | PRN
  Administered 2015-10-27: 110.6 mg via INTRAVENOUS

## 2015-10-27 MED ORDER — ACETAMINOPHEN 10 MG/ML IV SOLN
INTRAVENOUS | Status: DC | PRN
  Administered 2015-10-27: 1000 mg via INTRAVENOUS

## 2015-10-27 MED ORDER — FENTANYL CITRATE (PF) 100 MCG/2ML IJ SOLN
25.0000 ug | INTRAMUSCULAR | Status: DC | PRN
  Administered 2015-10-27 (×6): 25 ug via INTRAVENOUS

## 2015-10-27 MED ORDER — DEXAMETHASONE SODIUM PHOSPHATE 10 MG/ML IJ SOLN
INTRAMUSCULAR | Status: DC | PRN
  Administered 2015-10-27: 10 mg via INTRAVENOUS

## 2015-10-27 MED ORDER — SODIUM CHLORIDE 0.9 % IV SOLN
INTRAVENOUS | Status: DC | PRN
  Administered 2015-10-27: 50 mL

## 2015-10-27 MED ORDER — CETYLPYRIDINIUM CHLORIDE 0.05 % MT LIQD
7.0000 mL | Freq: Two times a day (BID) | OROMUCOSAL | Status: DC
  Administered 2015-10-27 – 2015-11-02 (×9): 7 mL via OROMUCOSAL

## 2015-10-27 MED ORDER — SUCCINYLCHOLINE CHLORIDE 20 MG/ML IJ SOLN
INTRAMUSCULAR | Status: DC | PRN
  Administered 2015-10-27: 100 mg via INTRAVENOUS

## 2015-10-27 MED ORDER — PRAVASTATIN SODIUM 20 MG PO TABS
40.0000 mg | ORAL_TABLET | Freq: Every day | ORAL | Status: DC
  Administered 2015-10-28 – 2015-11-02 (×6): 40 mg via ORAL
  Filled 2015-10-27 (×8): qty 2

## 2015-10-27 MED ORDER — CYCLOBENZAPRINE HCL 10 MG PO TABS: 10.0000 mg | ORAL_TABLET | Freq: Three times a day (TID) | ORAL | Status: DC

## 2015-10-27 MED ORDER — BISACODYL 5 MG PO TBEC
10.0000 mg | DELAYED_RELEASE_TABLET | Freq: Every day | ORAL | Status: DC
  Administered 2015-10-28 – 2015-11-03 (×6): 10 mg via ORAL
  Filled 2015-10-27 (×6): qty 2

## 2015-10-27 MED ORDER — FENTANYL CITRATE (PF) 100 MCG/2ML IJ SOLN
INTRAMUSCULAR | Status: DC | PRN
  Administered 2015-10-27: 50 ug via INTRAVENOUS
  Administered 2015-10-27: 100 ug via INTRAVENOUS
  Administered 2015-10-27 (×3): 50 ug via INTRAVENOUS
  Administered 2015-10-27: 100 ug via INTRAVENOUS
  Administered 2015-10-27: 50 ug via INTRAVENOUS

## 2015-10-27 MED ORDER — LIDOCAINE HCL (CARDIAC) 20 MG/ML IV SOLN
INTRAVENOUS | Status: DC | PRN
  Administered 2015-10-27: 80 mg via INTRAVENOUS

## 2015-10-27 MED ORDER — EPHEDRINE SULFATE 50 MG/ML IJ SOLN
INTRAMUSCULAR | Status: DC | PRN
  Administered 2015-10-27: 10 mg via INTRAVENOUS

## 2015-10-27 MED ORDER — SODIUM CHLORIDE 0.9 % IJ SOLN
INTRAMUSCULAR | Status: AC
  Filled 2015-10-27: qty 50

## 2015-10-27 MED ORDER — ONDANSETRON HCL 4 MG/2ML IJ SOLN
4.0000 mg | Freq: Four times a day (QID) | INTRAMUSCULAR | Status: DC | PRN
  Administered 2015-10-27 – 2015-10-29 (×6): 4 mg via INTRAVENOUS
  Filled 2015-10-27 (×6): qty 2

## 2015-10-27 MED ORDER — SCOPOLAMINE 1 MG/3DAYS TD PT72
1.0000 | MEDICATED_PATCH | Freq: Once | TRANSDERMAL | Status: DC
  Administered 2015-10-27: 1.5 mg via TRANSDERMAL

## 2015-10-27 MED ORDER — ALBUTEROL SULFATE (2.5 MG/3ML) 0.083% IN NEBU
2.5000 mg | INHALATION_SOLUTION | RESPIRATORY_TRACT | Status: DC
  Administered 2015-10-27: 2.5 mg via RESPIRATORY_TRACT
  Filled 2015-10-27: qty 3

## 2015-10-27 MED ORDER — FENTANYL CITRATE (PF) 100 MCG/2ML IJ SOLN
INTRAMUSCULAR | Status: AC
  Administered 2015-10-27: 25 ug via INTRAVENOUS
  Filled 2015-10-27: qty 2

## 2015-10-27 MED ORDER — MORPHINE SULFATE (PF) 2 MG/ML IV SOLN
1.0000 mg | INTRAVENOUS | Status: DC | PRN
  Administered 2015-10-27 – 2015-10-28 (×6): 2 mg via INTRAVENOUS
  Administered 2015-10-28: 1 mg via INTRAVENOUS
  Administered 2015-10-28 – 2015-10-30 (×3): 2 mg via INTRAVENOUS
  Filled 2015-10-27 (×10): qty 1

## 2015-10-27 MED ORDER — OXYCODONE HCL 5 MG/5ML PO SOLN: 5.0000 mg | Freq: Once | ORAL | Status: DC | PRN

## 2015-10-27 MED ORDER — DEXTROSE 5 % IV SOLN
1.5000 g | INTRAVENOUS | Status: AC
  Administered 2015-10-27: 1.5 g via INTRAVENOUS
  Filled 2015-10-27: qty 1.5

## 2015-10-27 MED ORDER — DEXTROSE-NACL 5-0.45 % IV SOLN
INTRAVENOUS | Status: DC
  Administered 2015-10-27 – 2015-10-30 (×5): via INTRAVENOUS

## 2015-10-27 MED ORDER — BUPIVACAINE LIPOSOME 1.3 % IJ SUSP
INTRAMUSCULAR | Status: AC
  Filled 2015-10-27: qty 20

## 2015-10-27 MED ORDER — ONDANSETRON HCL 4 MG/2ML IJ SOLN
INTRAMUSCULAR | Status: DC | PRN
  Administered 2015-10-27: 4 mg via INTRAVENOUS

## 2015-10-27 MED ORDER — FAMOTIDINE 20 MG PO TABS
20.0000 mg | ORAL_TABLET | Freq: Once | ORAL | Status: AC
Start: 2015-10-27 — End: 2015-10-27
  Administered 2015-10-27: 20 mg via ORAL

## 2015-10-27 MED ORDER — ROCURONIUM BROMIDE 100 MG/10ML IV SOLN
INTRAVENOUS | Status: DC | PRN
  Administered 2015-10-27 (×2): 40 mg via INTRAVENOUS
  Administered 2015-10-27: 10 mg via INTRAVENOUS

## 2015-10-27 MED ORDER — LACTATED RINGERS IV SOLN
INTRAVENOUS | Status: DC
  Administered 2015-10-27 (×3): via INTRAVENOUS

## 2015-10-27 MED ORDER — LISINOPRIL 5 MG PO TABS
2.5000 mg | ORAL_TABLET | Freq: Every day | ORAL | Status: DC
  Administered 2015-10-28 – 2015-11-02 (×6): 2.5 mg via ORAL
  Filled 2015-10-27 (×8): qty 1

## 2015-10-27 MED ORDER — KETOROLAC TROMETHAMINE 15 MG/ML IJ SOLN
15.0000 mg | Freq: Four times a day (QID) | INTRAMUSCULAR | Status: DC
  Administered 2015-10-27 (×2): 15 mg via INTRAVENOUS
  Filled 2015-10-27 (×2): qty 1

## 2015-10-27 MED ORDER — MINERAL OIL LIGHT 100 % EX OIL
TOPICAL_OIL | CUTANEOUS | Status: AC
  Filled 2015-10-27: qty 25

## 2015-10-27 MED ORDER — PROPOFOL 10 MG/ML IV BOLUS
INTRAVENOUS | Status: DC | PRN
  Administered 2015-10-27: 150 mg via INTRAVENOUS

## 2015-10-27 SURGICAL SUPPLY — 68 items
BNDG COHESIVE 4X5 TAN STRL (GAUZE/BANDAGES/DRESSINGS) IMPLANT
BRONCHOSCOPE PED SLIM DISP (MISCELLANEOUS) ×2 IMPLANT
BULB RESERV EVAC DRAIN JP 100C (MISCELLANEOUS) ×2 IMPLANT
CANISTER SUCT 1200ML W/VALVE (MISCELLANEOUS) ×2 IMPLANT
CATH  RT ANGL  28F  SOF (CATHETERS) ×1
CATH RT ANGL 28F SOF (CATHETERS) ×1 IMPLANT
CATH THOR STR 28F  SOFT WA (CATHETERS) ×1
CATH THOR STR 28F SOFT WA (CATHETERS) ×1 IMPLANT
CATH TRAY 16F METER LATEX (MISCELLANEOUS) ×2 IMPLANT
CATH URET ROBINSON 16FR STRL (CATHETERS) ×2 IMPLANT
CHLORAPREP W/TINT 26ML (MISCELLANEOUS) ×4 IMPLANT
CNTNR SPEC 2.5X3XGRAD LEK (MISCELLANEOUS)
CONN REDUCER 3/8X3/8X3/8Y (CONNECTOR) ×2
CONNECTOR REDUCER 3/8X3/8X3/8Y (CONNECTOR) ×1 IMPLANT
CONT SPEC 4OZ STER OR WHT (MISCELLANEOUS)
CONT SPEC 4OZ STRL OR WHT (MISCELLANEOUS)
CONTAINER SPEC 2.5X3XGRAD LEK (MISCELLANEOUS) IMPLANT
CUTTER ECHEON FLEX ENDO 45 340 (ENDOMECHANICALS) IMPLANT
DRAIN CHANNEL JP 19F (MISCELLANEOUS) ×2 IMPLANT
DRAIN CHEST DRY SUCT SGL (MISCELLANEOUS) ×2 IMPLANT
DRAPE C-SECTION (MISCELLANEOUS) ×2 IMPLANT
DRAPE MAG INST 16X20 L/F (DRAPES) ×2 IMPLANT
DRSG TEGADERM 2-3/8X2-3/4 SM (GAUZE/BANDAGES/DRESSINGS) IMPLANT
DRSG TEGADERM 6X8 (GAUZE/BANDAGES/DRESSINGS) ×2 IMPLANT
DRSG TELFA 3X8 NADH (GAUZE/BANDAGES/DRESSINGS) ×2 IMPLANT
ELECT BLADE 6.5 EXT (BLADE) ×2 IMPLANT
ELECT CAUTERY BLADE TIP 2.5 (TIP) ×2
ELECTRODE CAUTERY BLDE TIP 2.5 (TIP) ×1 IMPLANT
GAUZE SPONGE 4X4 12PLY STRL (GAUZE/BANDAGES/DRESSINGS) ×2 IMPLANT
GLOVE EXAM LX STRL 7.5 (GLOVE) ×4 IMPLANT
GOWN STRL REUS W/ TWL LRG LVL3 (GOWN DISPOSABLE) ×3 IMPLANT
GOWN STRL REUS W/TWL LRG LVL3 (GOWN DISPOSABLE) ×3
KIT RM TURNOVER STRD PROC AR (KITS) ×2 IMPLANT
LABEL OR SOLS (LABEL) ×2 IMPLANT
LOOP RED MAXI  1X406MM (MISCELLANEOUS) ×1
LOOP VESSEL MAXI 1X406 RED (MISCELLANEOUS) ×1 IMPLANT
MARKER SKIN W/RULER 31145785 (MISCELLANEOUS) ×2 IMPLANT
NEEDLE SPNL 25GX3.5 QUINCKE BL (NEEDLE) ×2 IMPLANT
PACK BASIN MAJOR ARMC (MISCELLANEOUS) ×2 IMPLANT
PAD GROUND ADULT SPLIT (MISCELLANEOUS) ×2 IMPLANT
RELOAD PROXIMATE 30MM BLUE (ENDOMECHANICALS) ×2 IMPLANT
RELOAD STAPLER LINE PROX 60 GR (STAPLE) ×1 IMPLANT
RELOAD STAPLER LINEAR PROX 30 (STAPLE) ×1 IMPLANT
SEALANT PROGEL (MISCELLANEOUS) ×2 IMPLANT
SPONGE KITTNER 5P (MISCELLANEOUS) ×6 IMPLANT
STAPLE RELOAD 2.5MM WHITE (STAPLE) ×8 IMPLANT
STAPLER RELOAD LINE PROX 60 GR (STAPLE) ×2
STAPLER RELOAD LINEAR PROX 30 (STAPLE) ×2
STAPLER SKIN PROX 35W (STAPLE) IMPLANT
STAPLER VASCULAR ECHELON 35 (CUTTER) IMPLANT
STRIP CLOSURE SKIN 1/2X4 (GAUZE/BANDAGES/DRESSINGS) ×2 IMPLANT
SUT PROLENE 5 0 RB 1 DA (SUTURE) ×8 IMPLANT
SUT SILK 0 (SUTURE) ×1
SUT SILK 0 30XBRD TIE 6 (SUTURE) ×1 IMPLANT
SUT SILK 1 SH (SUTURE) ×14 IMPLANT
SUT VIC AB 0 CT1 36 (SUTURE) ×4 IMPLANT
SUT VIC AB 2-0 CT1 27 (SUTURE) ×2
SUT VIC AB 2-0 CT1 TAPERPNT 27 (SUTURE) ×2 IMPLANT
SUT VICRYL 2 TP 1 (SUTURE) ×6 IMPLANT
SYR 10ML SLIP (SYRINGE) ×2 IMPLANT
SYR BULB IRRIG 60ML STRL (SYRINGE) ×2 IMPLANT
TAPE ADH 3 LX (MISCELLANEOUS) ×2 IMPLANT
TAPE TRANSPORE STRL 2 31045 (GAUZE/BANDAGES/DRESSINGS) IMPLANT
TROCAR FLEXIPATH 20X80 (ENDOMECHANICALS) IMPLANT
TROCAR FLEXIPATH THORACIC 15MM (ENDOMECHANICALS) ×2 IMPLANT
TUBING CONNECTING 10 (TUBING) ×2 IMPLANT
WATER STERILE IRR 1000ML POUR (IV SOLUTION) ×2 IMPLANT
YANKAUER SUCT BULB TIP FLEX NO (MISCELLANEOUS) ×4 IMPLANT

## 2015-10-27 NOTE — Op Note (Signed)
10/27/2015  5:08 PM  PATIENT:  [name]  69 y.o. female  PRE-OPERATIVE DIAGNOSIS:  Right lower lobe mass  POST-OPERATIVE DIAGNOSIS:  Same  PROCEDURE:  Preoperative bronchoscopy for tube placement, right thoracotomy (muscle-sparing) with right lower lobectomy and mediastinal lymphadenectomy  SURGEON:  Surgeon(s) and Role:    * Nestor Lewandowsky, MD - Primary  ASSISTANTS: Dr. Octavia Heir  ANESTHESIA: Gen.  INDICATIONS FOR PROCEDURE this patient is a 69 year old woman with a right lower lobe biopsy-proven adenocarcinoma. She underwent extensive preoperative evaluation which included a PET scan showing a mass in the thyroid. She underwent mediastinal staging by EBUS which was negative for metastatic disease. She was offered the above-named procedure for definitive diagnosis and treatment.  DICTATION: Patient brought to the operating suite and placed in supine position. General endotracheal anesthesia was given with a double-lumen tube. Preoperative bronchoscopy was carried out and was normal to the subsegmental levels bilaterally. The tube was in good position. The patient was turned for a right thoracotomy. All pressure points were carefully padded. Patient was prepped and draped in usual sterile fashion.  A muscle-sparing thoracotomy was performed. Incision was begun just at the tip of the scapula and carried forward. The latissimus and serratus muscles were freed and retracted anteriorly and posteriorly. The chest was entered through approximately the fifth intercostal space. Her ribs were near vertical instead a horizontal which made exposure somewhat difficult. However we could easily see the mass within the right lower lobe. There are no other lesions present. There was one adhesion to the middle lobe which was taken down. The inferior pulmonary ligament was freed up and several small lymph nodes were taken along the inferior pulmonary ligament. Inferior pulmonary vein was  encircled. The fissure was then opened and the branches of the lower lobe artery branches were dissected. The tumor itself was within the lower lobe and using electrocautery we excised this from the fissure going to the upper lobe making sure we were away from the tumor. At this point we elected to take all the arterial branches using a vascular stapler. There were 3 individual branches going to the lower lobe. The middle lobe branches were easily seen and kept out of harm's way. The inferior pulmonary vein was then stapled as well. The only remaining structure was the bronchus. This was secured with 2 firings of a TX stapler - 1 for the basilar branches and one for the superior segmental branch. This was done to prevent any impingement on the right middle lobe bronchus. Before the staples were fired the lung was ventilated and the middle lobe and upper lobes ventilated nicely. We then opened the pretracheal space and the subcarinal space and appropriate lymph nodes were taken. We will leak checked the bronchus at 30 cm water pressure.  We did not find any air leak from the bronchus but there was some bleeding along the cut edge of the basilar bronchial stump. This was oversewn with a 5-0 Prolene. We then used pro-gel on the cut surfaces of the lung. Liposomal bupivacaine was then used as a paravertebral block. Some was also distributed along the chest tube sites. 2 chest tubes were then inserted in standard fashion. A 28 straight to the apex of the lung and a 28 angled along the paravertebral space. The chest was then closed after insuring hemostasis. #2 Vicryl were used to approximate the ribs. The muscles were allowed returned to their normal anatomic position. The subcutaneous tissues were drained with a 19 Blake and  the subcutaneous tissues along with Scarpa's fascia were closed with a running 3-0 Vicryl and the skin with a 4-0 Monocryl. Sterile dressings were applied after Steri-Strips were placed. Patient  tolerated procedure well was extubated and taken to the recovery room in stable condition. All sponge needle and instrument counts were correct as reported to me at the end the case   Nestor Lewandowsky, MD

## 2015-10-27 NOTE — Anesthesia Postprocedure Evaluation (Signed)
Anesthesia Post Note  Patient: [name]  Procedure(s) Performed: Procedure(s) (LRB): THORACOTOMY/LOBECTOMY (Right)  Patient location during evaluation: PACU Anesthesia Type: General Level of consciousness: awake and alert Pain management: pain level controlled Vital Signs Assessment: post-procedure vital signs reviewed and stable Respiratory status: spontaneous breathing, nonlabored ventilation, respiratory function stable and patient connected to nasal cannula oxygen Cardiovascular status: blood pressure returned to baseline and stable Postop Assessment: no signs of nausea or vomiting Anesthetic complications: no    Last Vitals:  Filed Vitals:   10/27/15 1815 10/27/15 1900  BP: 123/65 121/65  Pulse: 86 77  Temp: 36.4 C 35.8 C  Resp: 17 13    Last Pain:  Filed Vitals:   10/27/15 1959  PainSc: 8                  Precious Haws Delyla Sandeen

## 2015-10-27 NOTE — H&P (View-Only) (Signed)
Patient ID: [name], female   DOB: Oct 15, 1946, 69 y.o.   MRN: [id number]  Chief Complaint  Patient presents with  . Lung Cancer    Referred By Dr. Ramond Craver he Reason for Referral right lower lobe mass  HPI Location, Quality, Duration, Severity, Timing, Context, Modifying Factors, Associated Signs and Symptoms.  [name] is a 69 y.o. female.  This is a 69 year old female with both a right lower lobe mass and a thyroid mass. Biopsies confirmed carcinoma. She had a set of pulmonary function studies done which reveal an FEV1 and a DLCO of approximate 70% each. She had a long discussion with Dr. Beverly Gust regarding her thyroid mass. He has recommended that she undergo lung resection first followed by thyroid resection. I'm in agreement with that. She comes in today and states that she's been doing reasonably well otherwise. She has no new problems or concerns. She had a 10 pound weight loss but this has been stable over the last couple weeks. She denies any fevers or chills.   Past Medical History  Diagnosis Date  . Hyperlipidemia   . Hypertension   . GERD (gastroesophageal reflux disease)   . Groin pain     Along superior/laterl R inguinal canal.  Could be due to hernia or old scar tissue.  prev with CT done w/o alarming findings.  Will treat episodically.  Use tylenol #3 prn.    . Normal cardiac stress test 2014  . PONV (postoperative nausea and vomiting)     nausea after hysterectomy  . Thyroid nodule   . Cancer of lung ([place]) 10/01/2015    Past Surgical History  Procedure Laterality Date  . Esophagogastroduodenoscopy  2002, 11/15/07    Normal (Dr. Allyn Kenner)  . Total vaginal hysterectomy  12/30/08    for fibroid pain (Dr. Gertie Fey)  . Facial cosmetic surgery  01/13/10    mini facelift  . Breast mass excision  1977    benign  . Hernia repair  1976  . Carpal tunnel release Right 1978  . Dilation and curettage of uterus  2001  . Colonoscopy  2009  . Abdominal hysterectomy   12-30-08  . Endobronchial ultrasound N/A 08/25/2015    Procedure: ENDOBRONCHIAL ULTRASOUND;  Surgeon: Flora Lipps, MD;  Location: [facility] ORS;  Service: Cardiopulmonary;  Laterality: N/A;  . Electromagnetic navigation brochoscopy N/A 08/25/2015    Procedure: ELECTROMAGNETIC NAVIGATION BRONCHOSCOPY;  Surgeon: Flora Lipps, MD;  Location: [facility] ORS;  Service: Cardiopulmonary;  Laterality: N/A;    Family History  Problem Relation Age of Onset  . Transient ischemic attack Mother   . Hypertension Mother   . Stroke Mother     mini strokes  . Hypertension Sister   . Cancer Sister     ovarian  . Hypertension Sister   . Hypertension Sister   . Hypertension Sister   . Breast cancer Neg Hx   . Colon cancer Neg Hx     Social History Social History  Substance Use Topics  . Smoking status: Never Smoker   . Smokeless tobacco: Never Used  . Alcohol Use: No    Allergies  Allergen Reactions  . Atorvastatin     REACTION: myalgias  . Ciprofloxacin     REACTION: increase reflux  . Epinephrine     Heart racing with dental injection  . Flexeril [Cyclobenzaprine]     Ineffective for back pain  . Metoprolol Succinate Swelling    Swelling and stiffness in hands and ankles  . Simvastatin Other (See  Comments)    Muscle aches and stiffness  . Sulfonamide Derivatives     REACTION: itching    Current Outpatient Prescriptions  Medication Sig Dispense Refill  . Ascorbic Acid (VITAMIN C) 500 MG tablet Take 500 mg by mouth daily.      . Biotin (PA BIOTIN) 1000 MCG tablet Take 1,000 mcg by mouth daily.    . Calcium Carbonate (CALCIUM 500 PO) Take by mouth daily.      . Cholecalciferol (VITAMIN D-3) 1000 UNITS CAPS Take 1 capsule by mouth daily.    . Coenzyme Q10 (COQ10) 50 MG CAPS Take 1 capsule by mouth daily.      . Folic Acid-Vit Z6-XWR U04 (FA-VITAMIN B-6-VITAMIN B-12 PO) Take 1 tablet by mouth daily.      Marland Kitchen HYDROcodone-acetaminophen (NORCO/VICODIN) 5-325 MG tablet TAKE 1 TO 2 TABLETS EVERY 6  HOURS  0  . lisinopril (PRINIVIL,ZESTRIL) 2.5 MG tablet Take 1 tablet (2.5 mg total) by mouth daily. 90 tablet 3  . omeprazole (PRILOSEC) 40 MG capsule Take 1 capsule (40 mg total) by mouth daily. 90 capsule 3  . potassium chloride (KLOR-CON) 8 MEQ tablet Take 8 mEq by mouth daily.    . pravastatin (PRAVACHOL) 40 MG tablet TAKE 1 TABLET EVERY DAY 90 tablet 3  . [DISCONTINUED] calcium gluconate 500 MG tablet Take 500 mg by mouth daily.      . [DISCONTINUED] CALCIUM-MAG-VIT C-VIT D PO Take by mouth daily.      . [DISCONTINUED] metoprolol succinate (TOPROL-XL) 25 MG 24 hr tablet Take 25 mg by mouth at bedtime.      . [DISCONTINUED] simvastatin (ZOCOR) 40 MG tablet Take 40 mg by mouth at bedtime.       No current facility-administered medications for this visit.      Review of Systems A complete review of systems was asked and was negative except for the following positive findings no fevers or chills. No shortness of breath. No cough.  Blood pressure 127/73, pulse 88, temperature 98.3 F (36.8 C), temperature source Tympanic, height '5\' 5"'$  (1.651 m), weight 122 lb 12.7 oz (55.7 kg), SpO2 98 %.  Physical Exam CONSTITUTIONAL:  Pleasant, well-developed, well-nourished, and in no acute distress. EYES: Pupils equal and reactive to light, Sclera non-icteric EARS, NOSE, MOUTH AND THROAT:  The oropharynx was clear.  Dentition is good repair.  Oral mucosa pink and moist. LYMPH NODES:  Lymph nodes in the neck and axillae were normal RESPIRATORY:  Lungs were clear.  Normal respiratory effort without pathologic use of accessory muscles of respiration CARDIOVASCULAR: Heart was regular without murmurs.  There were no carotid bruits. GI: The abdomen was soft, nontender, and nondistended. There were no palpable masses. There was no hepatosplenomegaly. There were normal bowel sounds in all quadrants. GU:  Rectal deferred.   MUSCULOSKELETAL:  Normal muscle strength and tone.  No clubbing or cyanosis.    SKIN:  There were no pathologic skin lesions.  There were no nodules on palpation. NEUROLOGIC:  Sensation is normal.  Cranial nerves are grossly intact. PSYCH:  Oriented to person, place and time.  Mood and affect are normal.  Data Reviewed Chest CT and PET scan  I have personally reviewed the patient's imaging, laboratory findings and medical records.    Assessment    I had a long discussion today with the patient regarding the management of her right lower lobe mass. We reviewed in extensive detail the indications and risks of thoracotomy with wedge resection versus segmentectomy versus lobectomy.  I told her I would need to reassess once again in the operating room for definitive management.    Plan    I have a long discussion with her today she is agreed to surgery. Preoperative consent was obtained. All of her labs will be obtained as well.      Nestor Lewandowsky, MD 10/15/2015, 10:08 AM

## 2015-10-27 NOTE — Progress Notes (Signed)
eLink Physician-Brief Progress Note Patient Name: [name] DOB: 05-Jan-1946 MRN: [id number]   Date of Service  10/27/2015  HPI/Events of Note  69 yo with s/p Thorocotomy s/p chest tube for RT lung mass Extubated successfully in PACU, VS stable with 98 o2 sat on 2 L Ursa SCD's for DVt proph NPO status, morphine as needed Albuterol as prescribed  eICU Interventions  No further recommendations at this time Follow up Dr. Doloris Hall        Jese Comella 10/27/2015, 7:06 PM

## 2015-10-27 NOTE — Anesthesia Procedure Notes (Signed)
Procedure Name: Intubation Date/Time: 10/27/2015 12:20 PM Performed by: Nelda Marseille Pre-anesthesia Checklist: Patient identified, Patient being monitored, Timeout performed, Emergency Drugs available and Suction available Patient Re-evaluated:Patient Re-evaluated prior to inductionOxygen Delivery Method: Circle system utilized Preoxygenation: Pre-oxygenation with 100% oxygen Intubation Type: IV induction Ventilation: Mask ventilation without difficulty Laryngoscope Size: 3 and McGraph Grade View: Grade II Tube type: Oral Endobronchial tube: Left and Double lumen EBT and 37 Fr Tube size: 7.0 mm Number of attempts: 2 Airway Equipment and Method: Stylet and Video-laryngoscopy Placement Confirmation: ETT inserted through vocal cords under direct vision,  positive ETCO2 and breath sounds checked- equal and bilateral Secured at: 21 cm Tube secured with: Tape Dental Injury: Teeth and Oropharynx as per pre-operative assessment

## 2015-10-27 NOTE — Brief Op Note (Signed)
10/27/2015  10/27/2015  5:05 PM  PATIENT:  [name]  69 y.o. female  PRE-OPERATIVE DIAGNOSIS:  RLL mass  POST-OPERATIVE DIAGNOSIS:  Same  PROCEDURE:  Right Lower Lobe resection  SURGEON:  Surgeon(s) and Role:    * Nestor Lewandowsky, MD - Primary  ASSISTANTS: Susa Griffins M.D.  ANESTHESIA: Genreal  INDICATIONS FOR PROCEDURE RLL mass  Postop Diagnosis RLL mas  Procedure RLL resection  Drains - 2 chest tubes and one JP  EBL 50 cc  To RR in stable condition     Nestor Lewandowsky, MD

## 2015-10-27 NOTE — Progress Notes (Signed)
Per Arvilla Meres, RN  - will put SACRAL PATCH ON PATIENT AFTER SURGERY

## 2015-10-27 NOTE — Interval H&P Note (Signed)
History and Physical Interval Note:  10/27/2015 11:36 AM  Drucie Opitz  has presented today for surgery, with the diagnosis of MALIGNANT NEOPLASM OF LOWER RIGHT LOBE  The various methods of treatment have been discussed with the patient and family. After consideration of risks, benefits and other options for treatment, the patient has consented to  Procedure(s): THORACOTOMY/LOBECTOMY (Right) as a surgical intervention .  The patient's history has been reviewed, patient examined, no change in status, stable for surgery.  I have reviewed the patient's chart and labs.  Questions were answered to the patient's satisfaction.     [name]

## 2015-10-27 NOTE — Anesthesia Preprocedure Evaluation (Signed)
Anesthesia Evaluation  Patient identified by MRN, date of birth, ID band Patient awake    Reviewed: Allergy & Precautions, H&P , NPO status , Patient's Chart, lab work & pertinent test results  History of Anesthesia Complications (+) PONV and history of anesthetic complications  Airway Mallampati: II  TM Distance: >3 FB Neck ROM: full    Dental  (+) Poor Dentition, Chipped, Caps   Pulmonary neg pulmonary ROS, neg shortness of breath,    Pulmonary exam normal breath sounds clear to auscultation       Cardiovascular Exercise Tolerance: Good hypertension, (-) angina(-) Past MI and (-) DOE Normal cardiovascular exam Rhythm:regular Rate:Normal     Neuro/Psych negative neurological ROS  negative psych ROS   GI/Hepatic Neg liver ROS, GERD  Controlled and Medicated,  Endo/Other  negative endocrine ROS  Renal/GU negative Renal ROS  negative genitourinary   Musculoskeletal   Abdominal   Peds  Hematology negative hematology ROS (+)   Anesthesia Other Findings Past Medical History:   Hyperlipidemia                                               Hypertension                                                 GERD (gastroesophageal reflux disease)                       Groin pain                                                     Comment:Along superior/laterl R inguinal canal.  Could               be due to hernia or old scar tissue.  prev with              CT done w/o alarming findings.  Will treat               episodically.  Use tylenol #3 prn.     Normal cardiac stress test                      2014         PONV (postoperative nausea and vomiting)                       Comment:nausea after hysterectomy   Thyroid nodule                                               Cancer of lung ([place])                            10/01/2015   Past Surgical History:   ESOPHAGOGASTRODUODENOSCOPY  2002, 1/1*      Comment:Normal (Dr. Allyn Kenner)   TOTAL VAGINAL HYSTERECTOMY                       12/30/08         Comment:for fibroid pain (Dr. Gertie Fey)   FACIAL COSMETIC SURGERY                          01/13/10        Comment:mini facelift   BREAST MASS EXCISION                             1977           Comment:benign   HERNIA REPAIR                                    1976         CARPAL TUNNEL RELEASE                           Right 1978         DILATION AND CURETTAGE OF UTERUS                 2001         COLONOSCOPY                                      2009         ABDOMINAL HYSTERECTOMY                           12-30-08       ENDOBRONCHIAL ULTRASOUND                        N/A 08/25/2015     Comment:Procedure: ENDOBRONCHIAL ULTRASOUND;  Surgeon:               Flora Lipps, MD;  Location: [facility] ORS;  Service:              Cardiopulmonary;  Laterality: N/A;   ELECTROMAGNETIC NAVIGATION BROCHOSCOPY          N/A 08/25/2015     Comment:Procedure: ELECTROMAGNETIC NAVIGATION               BRONCHOSCOPY;  Surgeon: Flora Lipps, MD;                Location: [facility] ORS;  Service: Cardiopulmonary;               Laterality: N/A;  BMI    Body Mass Index   20.30 kg/m 2      Reproductive/Obstetrics negative OB ROS                             Anesthesia Physical Anesthesia Plan  ASA: III  Anesthesia Plan: General ETT   Post-op Pain Management:    Induction:   Airway Management Planned: Double Lumen EBT  Additional Equipment:   Intra-op Plan:   Post-operative Plan:   Informed Consent: I have reviewed the patients History and Physical, chart, labs and discussed the procedure including  the risks, benefits and alternatives for the proposed anesthesia with the patient or authorized representative who has indicated his/her understanding and acceptance.   Dental Advisory Given  Plan Discussed with: Anesthesiologist, CRNA and Surgeon  Anesthesia Plan Comments:          Anesthesia Quick Evaluation

## 2015-10-27 NOTE — Progress Notes (Signed)
SACRAL DRSG SENT TO OR WITH PT (not applied preop secondary to not sure of pt's position during surgery)

## 2015-10-27 NOTE — Progress Notes (Signed)
Per Elta Guadeloupe, CRNA, he will start abx in OR

## 2015-10-27 NOTE — Transfer of Care (Signed)
Immediate Anesthesia Transfer of Care Note  Patient: [name]  Procedure(s) Performed: Procedure(s): THORACOTOMY/LOBECTOMY (Right)  Patient Location: PACU  Anesthesia Type:General  Level of Consciousness: sedated and responds to stimulation  Airway & Oxygen Therapy: Patient Spontanous Breathing and Patient connected to face mask oxygen  Post-op Assessment: Report given to RN and Post -op Vital signs reviewed and stable  Post vital signs: Reviewed and stable  Last Vitals:  Filed Vitals:   10/27/15 1032 10/27/15 1651  BP: 150/78 111/61  Pulse: 96 81  Temp: 36.6 C 36.7 C  Resp: 16 12    Complications: No apparent anesthesia complications

## 2015-10-28 ENCOUNTER — Inpatient Hospital Stay: Payer: Commercial Managed Care - HMO

## 2015-10-28 ENCOUNTER — Encounter: Payer: Self-pay | Admitting: Cardiothoracic Surgery

## 2015-10-28 LAB — TYPE AND SCREEN
ABO/RH(D): A POS
Antibody Screen: NEGATIVE
Unit division: 0
Unit division: 0

## 2015-10-28 LAB — COMPREHENSIVE METABOLIC PANEL
ALT: 21 U/L (ref 14–54)
AST: 25 U/L (ref 15–41)
Albumin: 3.2 g/dL — ABNORMAL LOW (ref 3.5–5.0)
Alkaline Phosphatase: 33 U/L — ABNORMAL LOW (ref 38–126)
Anion gap: 5 (ref 5–15)
BUN: 12 mg/dL (ref 6–20)
CO2: 27 mmol/L (ref 22–32)
Calcium: 8 mg/dL — ABNORMAL LOW (ref 8.9–10.3)
Chloride: 102 mmol/L (ref 101–111)
Creatinine, Ser: 0.61 mg/dL (ref 0.44–1.00)
GFR calc Af Amer: >60 mL/min (ref >60)
GFR calc non Af Amer: >60 mL/min (ref >60)
Glucose, Bld: 175 mg/dL — ABNORMAL HIGH (ref 65–99)
Potassium: 4.7 mmol/L (ref 3.5–5.1)
Sodium: 134 mmol/L — ABNORMAL LOW (ref 135–145)
Total Bilirubin: 0.8 mg/dL (ref 0.3–1.2)
Total Protein: 5.4 g/dL — ABNORMAL LOW (ref 6.5–8.1)

## 2015-10-28 LAB — CBC
HCT: 36.1 % (ref 35.0–47.0)
Hemoglobin: 12.2 g/dL (ref 12.0–16.0)
MCH: 30.4 pg (ref 26.0–34.0)
MCHC: 33.9 g/dL (ref 32.0–36.0)
MCV: 89.8 fL (ref 80.0–100.0)
Platelets: 136 10*3/uL — ABNORMAL LOW (ref 150–440)
RBC: 4.02 MIL/uL (ref 3.80–5.20)
RDW: 14 % (ref 11.5–14.5)
WBC: 11.1 10*3/uL — ABNORMAL HIGH (ref 3.6–11.0)

## 2015-10-28 MED ORDER — SODIUM CHLORIDE 0.9 % IV BOLUS (SEPSIS)
250.0000 mL | Freq: Once | INTRAVENOUS | Status: AC
  Administered 2015-10-28: 250 mL via INTRAVENOUS

## 2015-10-28 MED ORDER — ALBUTEROL SULFATE (2.5 MG/3ML) 0.083% IN NEBU
2.5000 mg | INHALATION_SOLUTION | Freq: Four times a day (QID) | RESPIRATORY_TRACT | Status: DC
  Administered 2015-10-28 – 2015-10-30 (×5): 2.5 mg via RESPIRATORY_TRACT
  Filled 2015-10-28 (×10): qty 3

## 2015-10-28 MED ORDER — HYDROCODONE-ACETAMINOPHEN 5-325 MG PO TABS
1.0000 | ORAL_TABLET | ORAL | Status: DC | PRN
  Administered 2015-10-28 – 2015-10-30 (×15): 1 via ORAL
  Administered 2015-10-31: 2 via ORAL
  Administered 2015-10-31: 1 via ORAL
  Administered 2015-10-31: 2 via ORAL
  Administered 2015-10-31: 1 via ORAL
  Administered 2015-10-31 – 2015-11-01 (×6): 2 via ORAL
  Administered 2015-11-02: 1 via ORAL
  Administered 2015-11-02: 2 via ORAL
  Administered 2015-11-02 (×4): 1 via ORAL
  Administered 2015-11-03 (×2): 2 via ORAL
  Administered 2015-11-03: 1 via ORAL
  Filled 2015-10-28 (×4): qty 1
  Filled 2015-10-28: qty 2
  Filled 2015-10-28 (×4): qty 1
  Filled 2015-10-28 (×2): qty 2
  Filled 2015-10-28 (×5): qty 1
  Filled 2015-10-28: qty 2
  Filled 2015-10-28 (×4): qty 1
  Filled 2015-10-28: qty 2
  Filled 2015-10-28: qty 1
  Filled 2015-10-28: qty 2
  Filled 2015-10-28: qty 1
  Filled 2015-10-28: qty 2
  Filled 2015-10-28 (×2): qty 1
  Filled 2015-10-28 (×3): qty 2
  Filled 2015-10-28 (×3): qty 1
  Filled 2015-10-28: qty 2

## 2015-10-28 NOTE — Evaluation (Signed)
Physical Therapy Evaluation Patient Details Name: [name] MRN: [id number] DOB: 12-Jun-1946 Today's Date: 10/28/2015   History of Present Illness  Pt is a 69 y.o. female s/p R LL resection (R thoracotomy with R lower lobectomy and mediastinal lymphadenectomy with chest tube placement) secondary to R lung mass.    Clinical Impression  Prior to admission, pt was independent without AD.  Pt lives with her husband in 1 level home with level entry.  Currently pt requires assist with bed mobility, transfers, and gait but activity limited d/t pt's c/o surgical pain (pt guarded with movement and breathing d/t pain) and pt appearing anxious with activity d/t concerns of falling.  Pt would benefit from skilled PT to address noted impairments and functional limitations.  Anticipate with improved pain control and confidence with functional mobility, pt will continue to improve with independence with functional mobility and be able to discharge home with support of family.     Follow Up Recommendations  (Anticipate no PT needs upon hospital discharge)    Equipment Recommendations       Recommendations for Other Services       Precautions / Restrictions Precautions Precautions: Fall Precaution Comments: may place chest tube to water seal during ambulation; keep O2 sats 92% or more Restrictions Weight Bearing Restrictions: No      Mobility  Bed Mobility Overal bed mobility: Needs Assistance Bed Mobility: Supine to Sit     Supine to sit: Mod assist;HOB elevated     General bed mobility comments: assist for trunk and vc's for technique required  Transfers Overall transfer level: Needs assistance Equipment used: 2 person hand held assist Transfers: Sit to/from [facility] Sit to Stand: Min assist;+2 physical assistance (x2 trials) Stand pivot transfers: Min assist;+2 physical assistance (bed to recliner)       General transfer comment: vc's for technique required;  increased time to stand d/t guarding from pain  Ambulation/Gait Ambulation/Gait assistance: Min assist;+2 physical assistance (with chair follow) Ambulation Distance (Feet): 15 Feet Assistive device: 2 person hand held assist;Rolling walker (2 wheeled) (first 10 feet with B hand hold assist; next 5 feet with RW)   Gait velocity: significantly decreased   General Gait Details: significant decreased B step length/foot clearance/heelstrike; pt very hesitant with taking steps and requiring vc's to continue to breathe; switched from B hand hold assist to RW but only mild improvement in gait pattern noted; limited distance d/t pt feeling lightheaded (BP 108/62)  Stairs            Wheelchair Mobility    Modified Rankin (Stroke Patients Only)       Balance Overall balance assessment: Needs assistance Sitting-balance support: No upper extremity supported;Feet supported Sitting balance-Leahy Scale: Good     Standing balance support: Bilateral upper extremity supported (on RW) Standing balance-Leahy Scale: Fair Standing balance comment: pt appearing anxious in standing about falling                             Pertinent Vitals/Pain Pain Assessment: 0-10 Pain Score: 6  Pain Location: surgical incision Pain Descriptors / Indicators: Sore;Tender;Operative site guarding Pain Intervention(s): Limited activity within patient's tolerance;Monitored during session;Premedicated before session;Repositioned  See flow sheet for vitals info.    Home Living Family/patient expects to be discharged to:: Private residence Living Arrangements: Spouse/significant other Available Help at Discharge: Family Type of Home:  (Condo) Home Access: Level entry     Home Layout: One level  Home Equipment: None      Prior Function Level of Independence: Independent               Hand Dominance        Extremity/Trunk Assessment   Upper Extremity Assessment: Overall WFL for tasks  assessed           Lower Extremity Assessment: Overall WFL for tasks assessed      Cervical / Trunk Assessment: Normal  Communication   Communication: No difficulties  Cognition Arousal/Alertness: Awake/alert Behavior During Therapy: Anxious Overall Cognitive Status: Within Functional Limits for tasks assessed                      General Comments General comments (skin integrity, edema, etc.): dressings intact; chest tube and JP drain in place  Nursing cleared pt for participation in physical therapy.  Pt agreeable to PT session.    Exercises        Assessment/Plan    PT Assessment Patient needs continued PT services  PT Diagnosis Difficulty walking;Acute pain   PT Problem List Decreased activity tolerance;Decreased balance;Decreased mobility;Pain  PT Treatment Interventions DME instruction;Gait training;Functional mobility training;Therapeutic activities;Therapeutic exercise;Balance training;Patient/family education   PT Goals (Current goals can be found in the Care Plan section) Acute Rehab PT Goals Patient Stated Goal: to go home PT Goal Formulation: With patient Time For Goal Achievement: 11/11/15 Potential to Achieve Goals: Good    Frequency Min 2X/week   Barriers to discharge        Co-evaluation               End of Session   Activity Tolerance: Patient limited by pain (Limited d/t pt feeling lightheaded with ambulation) Patient left: in chair;with call bell/phone within reach;with SCD's reapplied Nurse Communication: Mobility status;Precautions         Time: 1405-1450 PT Time Calculation (min) (ACUTE ONLY): 45 min   Charges:   PT Evaluation $Initial PT Evaluation Tier I: 1 Procedure PT Treatments $Therapeutic Activity: 8-22 mins   PT G CodesLeitha Bleak 11-08-2015, 4:11 PM Leitha Bleak, [place]

## 2015-10-28 NOTE — Progress Notes (Signed)
[name] Inpatient Post-Op Note  Patient ID: [name], female   DOB: 1946/08/26, 69 y.o.   MRN: [id number]  HISTORY: Pain under good control with Toradol and Morphine.  Urine output low but responded to fluid bolus.  Not short of breath.  Hungry - wants breakfast.   Filed Vitals:   10/28/15 0500 10/28/15 0600  BP: 101/54 100/55  Pulse: 71 73  Temp:    Resp: 12 11     EXAM: Resp: Lungs are clear on the left but diminished on the right.  No respiratory distress, normal effort. Heart:  Regular without murmurs Abd:  Abdomen is soft, non distended and non tender. No masses are palpable.  There is no rebound and no guarding.  Neurological: Alert and oriented to person, place, and time. Coordination normal.  Skin: Skin is warm and dry. No rash noted. No diaphoretic. No erythema. No pallor. Dressing dry and intact Psychiatric: Normal mood and affect. Normal behavior. Judgment and thought content normal.  No air leak.  Serous to serosanguinous chest tube and JP drainage   ASSESSMENT: Status post RLL resection.  Oliguric responded to fluid.     PLAN:   Will check CXRay this morning. Will advance diet Will ask PT to see for therapy Add oral pain meds Transfer to floor later today    Nestor Lewandowsky, MD

## 2015-10-28 NOTE — Progress Notes (Signed)
Patient resting between care, pain effectively managed with morphine '2mg'$  approx. Every 2 hours. Vital signs within defined limits. MD previously notified of decreased UOP, improved with 575m saline bolus. Patient instructed on TCDB, will continue to assess for changes/ patient need.

## 2015-10-28 NOTE — Care Management (Signed)
Patient had right thoracotomy, chest tube placement and excision right lower lobe for lung mass 12/27.

## 2015-10-28 NOTE — Progress Notes (Addendum)
Dr. Genevive Bi notified of patients decreased urine output, orders received for 250 ml NS bolus. And have am labs drawn. Will continue to monitor for changes/improvement.  Further order received to [place], will update physician at [facility]

## 2015-10-29 LAB — URINALYSIS COMPLETE WITH MICROSCOPIC (ARMC ONLY)
Bacteria, UA: NONE SEEN
Bilirubin Urine: NEGATIVE
Glucose, UA: NEGATIVE mg/dL
Hgb urine dipstick: NEGATIVE
Ketones, ur: NEGATIVE mg/dL
Leukocytes, UA: NEGATIVE
Nitrite: NEGATIVE
Protein, ur: NEGATIVE mg/dL
RBC / HPF: NONE SEEN RBC/hpf (ref 0–5)
Specific Gravity, Urine: 1.001 — ABNORMAL LOW (ref 1.005–1.030)
Squamous Epithelial / LPF: NONE SEEN
pH: 7 (ref 5.0–8.0)

## 2015-10-29 MED ORDER — CALCIUM CARBONATE ANTACID 500 MG PO CHEW
1.0000 | CHEWABLE_TABLET | Freq: Three times a day (TID) | ORAL | Status: DC | PRN
  Administered 2015-10-29: 400 mg via ORAL
  Administered 2015-10-29 (×2): 200 mg via ORAL
  Filled 2015-10-29: qty 2
  Filled 2015-10-29 (×2): qty 1

## 2015-10-29 MED ORDER — OMEPRAZOLE 20 MG PO CPDR
40.0000 mg | DELAYED_RELEASE_CAPSULE | Freq: Every day | ORAL | Status: DC
  Administered 2015-10-30 – 2015-11-03 (×5): 40 mg via ORAL
  Filled 2015-10-29 (×6): qty 2

## 2015-10-29 MED ORDER — PROMETHAZINE HCL 25 MG/ML IJ SOLN
12.5000 mg | Freq: Four times a day (QID) | INTRAMUSCULAR | Status: DC | PRN
  Administered 2015-10-29: 25 mg via INTRAVENOUS
  Filled 2015-10-29: qty 1

## 2015-10-29 MED ORDER — NON FORMULARY: 40.0000 mg | Freq: Every day | Status: DC

## 2015-10-29 NOTE — Progress Notes (Signed)
Initial Nutrition Assessment     INTERVENTION:   Meals and Snacks: Cater to patient preferences Medical Food Supplement Therapy: recommend addition of Carnation Instant Breakfast supplement BID on meal trays   NUTRITION DIAGNOSIS:   No nutrition diagnosis at this time  GOAL:   Patient will meet greater than or equal to 90% of their needs  MONITOR:    (Energy Intake, Anthropometrics, Digestive System,, Electrolyte/Renal Profile)  REASON FOR ASSESSMENT:   Malnutrition Screening Tool    ASSESSMENT:    Pt admitted with right lower lobe mass s/p right thoracotomy with right lower lobectomy and mediastinal lymphadenectomy  Past Medical History  Diagnosis Date  . Hyperlipidemia   . Hypertension   . GERD (gastroesophageal reflux disease)   . Groin pain     Along superior/laterl R inguinal canal.  Could be due to hernia or old scar tissue.  prev with CT done w/o alarming findings.  Will treat episodically.  Use tylenol #3 prn.    . Normal cardiac stress test 2014  . PONV (postoperative nausea and vomiting)     nausea after hysterectomy  . Thyroid nodule   . Cancer of lung ([place]) 10/01/2015   Diet Order:  Diet regular Room service appropriate?: Yes; Fluid consistency:: Thin   Energy Intake: pt ate 100% of Regular Breakfast Tray this AM, pt tolerated CL tray previously with good intake  Electrolyte and Renal Profile:  Recent Labs Lab 10/28/15 0332  BUN 12  CREATININE 0.61  NA 134*  K 4.7   Glucose Profile:  Recent Labs  10/27/15 1808  GLUCAP 173*   Meds: D5-1/2 NS at 75 ml/hr (306 kcals)  Height:   Ht Readings from Last 1 Encounters:  10/27/15 '5\' 5"'$  (1.651 m)    Weight: weight stable as per weight encounters  Wt Readings from Last 1 Encounters:  10/27/15 122 lb (55.339 kg)    Wt Readings from Last 10 Encounters:  10/27/15 122 lb (55.339 kg)  10/20/15 122 lb (55.339 kg)  10/15/15 122 lb 12.7 oz (55.7 kg)  10/01/15 121 lb 14.6 oz (55.3 kg)  09/28/15  123 lb 0.3 oz (55.8 kg)  09/21/15 120 lb (54.432 kg)  09/17/15 120 lb 13 oz (54.8 kg)  09/10/15 120 lb 9.5 oz (54.7 kg)  09/08/15 120 lb (54.432 kg)  09/03/15 119 lb 14.9 oz (54.4 kg)    BMI:  Body mass index is 20.3 kg/(m^2).  LOW Care Level   Kerman Passey MS, [place], LDN [phone] Pager  [phone] Weekend/On-Call Pager

## 2015-10-29 NOTE — Care Management Important Message (Signed)
Important Message  Patient Details  Name: [name] MRN: [id number] Date of Birth: 1945-12-21   Medicare Important Message Given:  Yes    [name] A [name] 10/29/2015, 11:50 AM

## 2015-10-29 NOTE — Progress Notes (Signed)
Dr. Genevive Bi notified of Bladder scan result of 0 and urine post foley insertion of 700. Urine sent down for UA/ culture.

## 2015-10-29 NOTE — Care Management (Signed)
Spoke with patient for discharge planning. Patient is alert and oriented from home with husband . Stated that she was very active before coming to hospital for surgery. 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 Patient was driving self prior to hospitalization and has family support at discharge. No DME at home. Continue to follow but no CM needs anticipated at discharge.

## 2015-10-29 NOTE — Progress Notes (Signed)
Dr. Genevive Bi notified of pt having urinary frequency voiding 300 mL at a time about every 10-15 min since 6pm. Bladder scan result 672. MD stated to insert foley and get UA and urine culture.  Call back with bladder scan result post catheter insertion. Orders entered.

## 2015-10-29 NOTE — Progress Notes (Signed)
[name] Inpatient Post-Op Note  Patient ID: [name], female   DOB: 1945/12/01, 69 y.o.   MRN: [id number]  HISTORY: Not short of breath. She does complain of pain at the thoracotomy incision. She has been reluctant to take her pain medications secondary to nausea. She did void twice overnight.   Filed Vitals:   10/28/15 1745 10/28/15 2106  BP: 110/63 117/57  Pulse: 71 73  Temp: 98.1 F (36.7 C) 97.9 F (36.6 C)  Resp: 16 24     EXAM: Resp: Lungs are clear bilaterally.  No respiratory distress, normal effort. Heart:  Regular without murmurs Abd:  Abdomen is soft, non distended and non tender. No masses are palpable.  There is no rebound and no guarding.  Neurological: Alert and oriented to person, place, and time. Coordination normal.  Skin: Skin is warm and dry. No rash noted. No diaphoretic. No erythema. No pallor.  Psychiatric: Normal mood and affect. Normal behavior. Judgment and thought content normal.  No air leak. We did walk around the nurse's station one time today and she did well with that.   ASSESSMENT: Postoperative day #2 status post right lower lobectomy. We will encourage her to take her oral pain medication. We will encourage physical therapy and ambulation. I will change all of her dressings later today. We will check labs tomorrow morning as well as a chest x-ray   PLAN:   See above    Nestor Lewandowsky, MD

## 2015-10-30 ENCOUNTER — Inpatient Hospital Stay: Payer: Commercial Managed Care - HMO

## 2015-10-30 LAB — CBC
HCT: 35.4 % (ref 35.0–47.0)
Hemoglobin: 11.8 g/dL — ABNORMAL LOW (ref 12.0–16.0)
MCH: 29.9 pg (ref 26.0–34.0)
MCHC: 33.4 g/dL (ref 32.0–36.0)
MCV: 89.6 fL (ref 80.0–100.0)
Platelets: 132 10*3/uL — ABNORMAL LOW (ref 150–440)
RBC: 3.95 MIL/uL (ref 3.80–5.20)
RDW: 14.3 % (ref 11.5–14.5)
WBC: 8.1 10*3/uL (ref 3.6–11.0)

## 2015-10-30 LAB — BASIC METABOLIC PANEL
Anion gap: 3 — ABNORMAL LOW (ref 5–15)
BUN: 5 mg/dL — ABNORMAL LOW (ref 6–20)
CO2: 29 mmol/L (ref 22–32)
Calcium: 8.2 mg/dL — ABNORMAL LOW (ref 8.9–10.3)
Chloride: 107 mmol/L (ref 101–111)
Creatinine, Ser: 0.57 mg/dL (ref 0.44–1.00)
GFR calc Af Amer: >60 mL/min (ref >60)
GFR calc non Af Amer: >60 mL/min (ref >60)
Glucose, Bld: 110 mg/dL — ABNORMAL HIGH (ref 65–99)
Potassium: 3.8 mmol/L (ref 3.5–5.1)
Sodium: 139 mmol/L (ref 135–145)

## 2015-10-30 MED ORDER — ALBUTEROL SULFATE (2.5 MG/3ML) 0.083% IN NEBU: 2.5000 mg | INHALATION_SOLUTION | Freq: Four times a day (QID) | RESPIRATORY_TRACT | Status: DC | PRN

## 2015-10-30 MED ORDER — DEXTROSE-NACL 5-0.45 % IV SOLN
INTRAVENOUS | Status: DC
  Administered 2015-10-30: 14:00:00 via INTRAVENOUS

## 2015-10-30 NOTE — Plan of Care (Signed)
Problem: Skin Integrity: Goal: Risk for impaired skin integrity will decrease Outcome: Not Progressing R chest tube incisions

## 2015-10-30 NOTE — Progress Notes (Signed)
Feeling better today.  No nausea.  Pain better.  Wounds changed and clean and dry  CXRay shows small lateral pneumothorax which was obtained when the patient was on water seal  Will leave on suction today. Labs today look good Will minimize IV fluids. Leave Foley for today  No pathology back yet  Dr. Azalee Course to cover this weekend.

## 2015-10-31 LAB — URINE CULTURE: Culture: NO GROWTH

## 2015-10-31 NOTE — Progress Notes (Signed)
69 yr old POD#4 from Right thoracotomy for adenocarcinoma.  Patient states doing well today.  She was able to get up and walk around the unit with the nurse.  She states pain has been well controlled with PO meds and nausea improved.   Filed Vitals:   10/30/15 2134 10/31/15 0444  BP: 113/50 120/50  Pulse: 91 80  Temp: 98 F (36.7 C) 98.1 F (36.7 C)  Resp: 17     I/O last 3 completed shifts: In: 2830.8 [P.O.:740; I.V.:2070.8; Other:20] Out: 2440 [NUUVO:5366; Drains:75; Chest Tube:210] Total I/O In: 135 [I.V.:135] Out: 360 [Urine:300; Drains:20; Chest Tube:40]   PE:  Gen: NAD Res: crackles in Right lung base, incision c/d/i, and Ct x 2 in place with serosanginous fluid.   A/P: Doing well today, continue po and iv prns for pain, will continue foley and CT to suction today, will get Xray in Am.

## 2015-10-31 NOTE — Plan of Care (Signed)
Problem: Bowel/Gastric: Goal: Will not experience complications related to bowel motility Outcome: Progressing Pt has had BM since surgery.

## 2015-10-31 NOTE — Progress Notes (Signed)
Dressing changed per order

## 2015-11-01 ENCOUNTER — Inpatient Hospital Stay: Payer: Commercial Managed Care - HMO

## 2015-11-01 NOTE — Progress Notes (Signed)
69 yr old POD#5 from Right thoracotomy for adenocarcinoma.  Patient states doing well today States she feels pain has improved.  She is frustrated by all the tubes she has decreasing her mobility.   Filed Vitals:   11/01/15 0536 11/01/15 1223  BP: 116/55 115/56  Pulse: 65 79  Temp: 98.4 F (36.9 C) 97.7 F (36.5 C)  Resp: 16 16    I/O last 3 completed shifts: In: 1127.3 [P.O.:720; I.V.:387.3; Other:20] Out: 3046 [Urine:2700; Drains:56; Chest Tube:290] Total I/O In: 56.7 [I.V.:56.7] Out: 250 [Urine:250]   PE:  Gen: NAD Res: crackles in Right lung base, incision c/d/i, and Ct x 2 in place with serosanginous fluid, jp serous drainage 45 cc  A/P: CXR with lung inflated, CT to waterseal today Will d/c foley, will monitor for recurrence of urinary retention Will d/c iv fluids to help with mobility.   Continue prn pain meds

## 2015-11-02 ENCOUNTER — Inpatient Hospital Stay: Payer: Commercial Managed Care - HMO

## 2015-11-02 LAB — CBC
HCT: 37.8 % (ref 35.0–47.0)
Hemoglobin: 12.8 g/dL (ref 12.0–16.0)
MCH: 30.9 pg (ref 26.0–34.0)
MCHC: 33.9 g/dL (ref 32.0–36.0)
MCV: 91.2 fL (ref 80.0–100.0)
Platelets: 190 10*3/uL (ref 150–440)
RBC: 4.15 MIL/uL (ref 3.80–5.20)
RDW: 13.9 % (ref 11.5–14.5)
WBC: 7.5 10*3/uL (ref 3.6–11.0)

## 2015-11-02 LAB — URINALYSIS COMPLETE WITH MICROSCOPIC (ARMC ONLY)
Bacteria, UA: NONE SEEN
Bilirubin Urine: NEGATIVE
Glucose, UA: NEGATIVE mg/dL
Hgb urine dipstick: NEGATIVE
Ketones, ur: NEGATIVE mg/dL
Nitrite: POSITIVE — AB
Protein, ur: NEGATIVE mg/dL
Specific Gravity, Urine: 1.006 (ref 1.005–1.030)
pH: 7 (ref 5.0–8.0)

## 2015-11-02 LAB — BASIC METABOLIC PANEL
Anion gap: 5 (ref 5–15)
BUN: 8 mg/dL (ref 6–20)
CO2: 32 mmol/L (ref 22–32)
Calcium: 8.7 mg/dL — ABNORMAL LOW (ref 8.9–10.3)
Chloride: 103 mmol/L (ref 101–111)
Creatinine, Ser: 0.52 mg/dL (ref 0.44–1.00)
GFR calc Af Amer: >60 mL/min (ref >60)
GFR calc non Af Amer: >60 mL/min (ref >60)
Glucose, Bld: 99 mg/dL (ref 65–99)
Potassium: 3.9 mmol/L (ref 3.5–5.1)
Sodium: 140 mmol/L (ref 135–145)

## 2015-11-02 NOTE — Care Management Important Message (Signed)
Important Message  Patient Details  Name: [name] MRN: [id number] Date of Birth: 04/16/1946   Medicare Important Message Given:  Yes    [name] A Zivah Mayr 11/02/2015, 10:42 AM

## 2015-11-02 NOTE — Progress Notes (Signed)
Doing much better.  Pain under good control  VSS, Afebrile CT drained about 190 cc last 24 hours Serous drainage  JP drained about 30 cc  CXray today - No pneumothorax  No air leak on exam Wounds clean and dry Lungs slightly diminished on the right  Removed the posterior chest tube  Will encourage ambulation today Plan for CXRay in the morning with subsequent discharge once tubes removed.

## 2015-11-02 NOTE — Progress Notes (Signed)
6 Days Post-Op   Subjective:  70 year old female 6 days status post right thoracotomy for adenocarcinoma. Patient has no complaints morning. She is tolerating regular diet and desires to continue to improve.  Vital signs in last 24 hours: Temp:  [97.7 F (36.5 C)-98.2 F (36.8 C)] 97.8 F (36.6 C) (01/02 0504) Pulse Rate:  [75-82] 75 (01/02 0504) Resp:  [16-17] 16 (01/02 0504) BP: (115-126)/(48-61) 126/61 mmHg (01/02 0504) SpO2:  [96 %-100 %] 98 % (01/02 0504) Last BM Date: 10/30/15  Intake/Output from previous day: 01/01 0701 - 01/02 0700 In: 296.7 [P.O.:240; I.V.:56.7] Out: 1946 [Urine:1725; Drains:29; Chest Tube:190]  Physical exam: Gen.: No acute distress Chest: Clear to auscultation bilaterally, chest tubes remained in place to the right chest on water seal without evidence of air leak, JP in place to the superficial tissues with serosanguineous output. Heart: Regular rate and rhythm GI: soft, non-tender; bowel sounds normal; no masses,  no organomegaly  Lab Results:  CBC  Recent Labs  11/02/15 0521  WBC 7.5  HGB 12.8  HCT 37.8  PLT 190   CMP     Component Value Date/Time   NA 140 11/02/2015 0521   NA 141 07/01/2013 0103   K 3.9 11/02/2015 0521   K 3.8 07/01/2013 0103   CL 103 11/02/2015 0521   CL 110* 07/01/2013 0103   CO2 32 11/02/2015 0521   CO2 27 07/01/2013 0103   GLUCOSE 99 11/02/2015 0521   GLUCOSE 88 07/01/2013 0103   BUN 8 11/02/2015 0521   BUN 11 07/01/2013 0103   CREATININE 0.52 11/02/2015 0521   CREATININE 0.68 07/01/2013 0103   CALCIUM 8.7* 11/02/2015 0521   CALCIUM 8.2* 07/01/2013 0103   PROT 5.4* 10/28/2015 0332   PROT 5.5* 07/01/2013 0103   ALBUMIN 3.2* 10/28/2015 0332   ALBUMIN 3.0* 07/01/2013 0103   AST 25 10/28/2015 0332   AST 19 07/01/2013 0103   ALT 21 10/28/2015 0332   ALT 24 07/01/2013 0103   ALKPHOS 33* 10/28/2015 0332   ALKPHOS 55 07/01/2013 0103   BILITOT 0.8 10/28/2015 0332   BILITOT 0.7 07/01/2013 0103   GFRNONAA  >60 11/02/2015 0521   GFRNONAA >60 07/01/2013 0103   GFRAA >60 11/02/2015 0521   GFRAA >60 07/01/2013 0103   PT/INR No results for input(s): LABPROT, INR in the last 72 hours.  Studies/Results: Dg Chest 2 View  11/02/2015  CLINICAL DATA:  Bronchogenic lung cancer. Thoracotomy 7 days ago. Chest pain. EXAM: CHEST  2 VIEW COMPARISON:  11/01/2015 and 10/30/2015 FINDINGS: Tiny right apical pneumothorax is unchanged. Two chest tubes appear in good position. Soft tissue drain in place. Left lung is clear except for slight linear scarring posterior medially. Heart size and vascularity are normal. Tiny right effusion. IMPRESSION: No change in tiny right apical pneumothorax.  Tiny right effusion. Electronically Signed   By: Lorriane Shire M.D.   On: 11/02/2015 09:28   Dg Chest Port 1 View  11/01/2015  CLINICAL DATA:  Status post thoracotomy. EXAM: PORTABLE CHEST 1 VIEW COMPARISON:  10/30/2015 FINDINGS: Right-sided chest tubes are in place. Surgical drain overlies the right breast shadow. Elevation of the right hemidiaphragm appears stable. Small right apical pneumothorax is identified, stable in appearance. There is minimal right basilar atelectasis. Left lung is clear. IMPRESSION: Small right apical pneumothorax is stable. Electronically Signed   By: Nolon Nations M.D.   On: 11/01/2015 09:18    Assessment/Plan: 70 year old female 6 days status post right thoracotomy. Chest x-ray today shows no  change despite being on waterseal for 24 hours. Plan to remove one of the 2 chest tubes today. We'll follow that up with repeat x-ray in the morning to determine the second chest tube can then come out.   Clayburn Pert, MD FACS General Surgeon  11/02/2015

## 2015-11-02 NOTE — Progress Notes (Signed)
Pt c/o burning when urinating. Paged MD, Orders to collect U/A and bladder scan after voiding. Bladder scan showed 0.

## 2015-11-02 NOTE — Progress Notes (Signed)
Physical Therapy Treatment Patient Details Name: [name] MRN: [id number] DOB: 1946-08-12 Today's Date: 11/02/2015    History of Present Illness Pt is a 70 y.o. female s/p R LL resection (R thoracotomy with R lower lobectomy and mediastinal lymphadenectomy with chest tube placement) secondary to R lung mass.      PT Comments    Pt initially CGA (mildly unsteady) ambulating around nursing loop (no AD) but with vc's for increased step length and cadence pt's gait and balance improved and pt SBA without any loss of balance.  Encouraged pt to continue to ambulate with nursing in hallways.  Anticipate no PT needs upon discharge from hospital.   Follow Up Recommendations  No PT follow up     Equipment Recommendations  None recommended by PT    Recommendations for Other Services       Precautions / Restrictions Precautions Precautions: Fall Precaution Comments: may place chest tube to water seal during ambulation; keep O2 sats 92% or more Restrictions Weight Bearing Restrictions: No    Mobility  Bed Mobility                  Transfers Overall transfer level: Needs assistance Equipment used: None Transfers: Sit to/from Stand Sit to Stand: Supervision         General transfer comment: guarded but steady without loss of balance  Ambulation/Gait Ambulation/Gait assistance: Min guard;Supervision Ambulation Distance (Feet): 480 Feet Assistive device: None   Gait velocity: decreased then improved to normal speed   General Gait Details: initially guarded gait with decreased B step length/foot clearance/heelstrike and mildly unsteady requiring CGA but with vc's for increasing cadence and taking longer step lengths pt's gait and balance improved (pt then SBA)   Stairs            Wheelchair Mobility    Modified Rankin (Stroke Patients Only)       Balance Overall balance assessment: Needs assistance Sitting-balance support: No upper extremity  supported;Feet supported Sitting balance-Leahy Scale: Normal     Standing balance support: No upper extremity supported Standing balance-Leahy Scale: Good                      Cognition Arousal/Alertness: Awake/alert Behavior During Therapy: Anxious Overall Cognitive Status: Within Functional Limits for tasks assessed                      Exercises      General Comments General comments (skin integrity, edema, etc.): chest tube and JP drain in place  Nursing cleared pt for participation in physical therapy.  Pt agreeable to PT session.      Pertinent Vitals/Pain Pain Assessment: 0-10 Pain Score: 4  Pain Location: surgical incision Pain Descriptors / Indicators: Tender;Operative site guarding;Sore Pain Intervention(s): Limited activity within patient's tolerance;Monitored during session;Repositioned  Vitals stable and WFL throughout treatment session.    Home Living                      Prior Function            PT Goals (current goals can now be found in the care plan section) Acute Rehab PT Goals Patient Stated Goal: to go home PT Goal Formulation: With patient Time For Goal Achievement: 11/11/15 Potential to Achieve Goals: Good Progress towards PT goals: Progressing toward goals    Frequency  Min 2X/week    PT Plan Current plan remains appropriate    Co-evaluation  End of Session   Activity Tolerance: Patient tolerated treatment well Patient left: in chair;with call bell/phone within reach     Time: 1211-1230 PT Time Calculation (min) (ACUTE ONLY): 19 min  Charges:  $Gait Training: 8-22 mins                    G CodesLeitha Bleak November 14, 2015, 1:36 PM Leitha Bleak, [place]

## 2015-11-03 ENCOUNTER — Inpatient Hospital Stay: Payer: Commercial Managed Care - HMO

## 2015-11-03 MED ORDER — BISACODYL 5 MG PO TBEC: 10.0000 mg | DELAYED_RELEASE_TABLET | Freq: Every day | ORAL | Status: DC

## 2015-11-03 MED ORDER — ALBUTEROL SULFATE (2.5 MG/3ML) 0.083% IN NEBU: 2.5000 mg | INHALATION_SOLUTION | Freq: Four times a day (QID) | RESPIRATORY_TRACT | Status: DC | PRN

## 2015-11-03 NOTE — Progress Notes (Signed)
Physical Therapy Treatment Patient Details Name: [name] MRN: [id number] DOB: October 18, 1946 Today's Date: 11/03/2015    History of Present Illness Pt is a 70 y.o. female s/p R LL resection (R thoracotomy with R lower lobectomy and mediastinal lymphadenectomy with chest tube placement) secondary to R lung mass.      PT Comments    Pt has met goals for therapy. Pt is safe and independent with ambulation. No fatigue noted. Pt very motivated to go home. No stairs to enter/exit home. All question/concerns addressed. Safe to dc in house. Possible dc from hospital, per pt.  Follow Up Recommendations  No PT follow up     Equipment Recommendations  None recommended by PT    Recommendations for Other Services       Precautions / Restrictions Precautions Precautions: Fall Precaution Comments: may place chest tube to water seal during ambulation; keep O2 sats 92% or more Restrictions Weight Bearing Restrictions: No    Mobility  Bed Mobility               General bed mobility comments: not performed, pt received sitting in recliner  Transfers Overall transfer level: Independent Equipment used: None             General transfer comment: safe technique performed. No Assistance needed  Ambulation/Gait Ambulation/Gait assistance: Modified independent (Device/Increase time) Ambulation Distance (Feet): 320 Feet Assistive device: None Gait Pattern/deviations: WFL(Within Functional Limits)     General Gait Details: Therapist held chest tube and pt able to ambulate in hallway with reciprocal gait pattern. Able to complete 10 walk test in 6 seconds demonstrating good speed.   Stairs            Wheelchair Mobility    Modified Rankin (Stroke Patients Only)       Balance                                    Cognition Arousal/Alertness: Awake/alert Behavior During Therapy: WFL for tasks assessed/performed Overall Cognitive Status: Within  Functional Limits for tasks assessed                      Exercises      General Comments        Pertinent Vitals/Pain Pain Assessment: No/denies pain Pain Score: 0-No pain    Home Living                      Prior Function            PT Goals (current goals can now be found in the care plan section) Acute Rehab PT Goals Patient Stated Goal: to go home PT Goal Formulation: With patient Time For Goal Achievement: 11/11/15 Potential to Achieve Goals: Good Progress towards PT goals: Goals met/education completed, patient discharged from PT    Frequency  Min 2X/week    PT Plan Current plan remains appropriate    Co-evaluation             End of Session Equipment Utilized During Treatment:  (chest tube) Activity Tolerance: Patient tolerated treatment well Patient left: in chair;with call bell/phone within reach     Time: 1349-1357 PT Time Calculation (min) (ACUTE ONLY): 8 min  Charges:  $Gait Training: 8-22 mins                    G Codes:  Natajah Derderian 11/03/2015, 3:27 PM  Greggory Stallion, PT, DPT [phone]

## 2015-11-03 NOTE — Discharge Instructions (Signed)
Return to clinic in one week

## 2015-11-03 NOTE — Progress Notes (Signed)
Did very well overnight.  Pain under good control  Chest tube clamped and repeat cxray without appreciable pneumothorax.  CT removed.  JP drained less than 10 cc so it too was removed.  Discharge instructions given  To see me in one week.    [employer].

## 2015-11-03 NOTE — Progress Notes (Signed)
Discharge instructions explained to pt/ verbalized an understanding/ iv removed/ chest dressing WNL/ transported off unit via wheelchair.

## 2015-11-03 NOTE — Discharge Summary (Signed)
Physician Discharge Summary  Patient ID: [name] MRN: [id number] DOB/AGE: Nov 07, 1945 70 y.o.  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  Chest tubes removed on POD #5 and #6.  JP removed.  Had one episode of urinary retention treated with a Foley catheter for 48 hours and subsequent removal without problems.  Post void residual was nil.  Discharged to home with healing wounds and no other complicatons.    Disposition: 01-Home or Self Care  Discharge Instructions    Diet - low sodium heart healthy    Complete by:  As directed      Discharge wound care:    Complete by:  As directed   Remove all dressings in 48 hours and wash over wounds with soap and water.  Do not lift more than 10 pounds or drive a car     Increase activity slowly    Complete by:  As directed             Medication List    STOP taking these medications        HYDROcodone-acetaminophen 5-325 MG tablet  Commonly known as:  NORCO/VICODIN      TAKE these medications        albuterol (2.5 MG/3ML) 0.083% nebulizer solution  Commonly known as:  PROVENTIL  Take 3 mLs (2.5 mg total) by nebulization every 6 (six) hours as needed for wheezing or shortness of breath.     bisacodyl 5 MG EC tablet  Commonly known as:  DULCOLAX  Take 2 tablets (10 mg total) by mouth daily.     CALCIUM 500 PO  Take by mouth daily.     CoQ10 50 MG Caps  Take 1 capsule by mouth daily.     FA-VITAMIN B-6-VITAMIN B-12 PO  Take 1 tablet by mouth daily.     lisinopril 2.5 MG tablet  Commonly known as:  PRINIVIL,ZESTRIL  Take 1 tablet (2.5 mg total) by mouth daily.     omeprazole 40 MG capsule  Commonly known as:  PRILOSEC  Take 1 capsule (40 mg total) by mouth daily.     PA BIOTIN 1000 MCG tablet  Generic drug:  Biotin  Take 1,000 mcg by mouth daily.     potassium  chloride 8 MEQ tablet  Commonly known as:  KLOR-CON  Take 8 mEq by mouth daily.     pravastatin 40 MG tablet  Commonly known as:  PRAVACHOL  TAKE 1 TABLET EVERY DAY     vitamin C 500 MG tablet  Commonly known as:  ASCORBIC ACID  Take 500 mg by mouth daily.     Vitamin D-3 1000 units Caps  Take 1 capsule by mouth daily.         Nestor Lewandowsky, MD

## 2015-11-06 ENCOUNTER — Telehealth: Payer: Self-pay | Admitting: Surgery

## 2015-11-06 NOTE — Telephone Encounter (Signed)
Patient called and wanted to know if she can use some ointment where her stitches are? It is itchy. Also she has a question about some medication that she was given

## 2015-11-07 ENCOUNTER — Telehealth: Payer: Self-pay | Admitting: General Surgery

## 2015-11-07 MED ORDER — COMPRESSOR/NEBULIZER MISC: 1.0000 | Freq: Four times a day (QID) | Status: DC | PRN

## 2015-11-07 NOTE — Telephone Encounter (Signed)
Patient called and left message with operator for surgeon to call her back.  Call patient back at home she had questions about being prescribed nebulizer medication without a nebulizer. Discussed that the equipment could be picked up at her pharmacy and an additional order was placed today in the computer for pickup at her local pharmacy. Her primary complaint is of a cough of which have a nebulizer treatment could help.  Patient also complains of the area with the stitches were from her chest tube. Discuss using topical antibiotic until sutures are removed.  Patient voiced understanding and was thankful for call back. Discharge her to call back to the hospital should she have any other questions. Otherwise she has follow-up with her surgeon on Thursday of this coming week.  Clayburn Pert, MD FACS General Surgeon [facility]

## 2015-11-11 ENCOUNTER — Other Ambulatory Visit: Payer: Self-pay | Admitting: *Deleted

## 2015-11-11 DIAGNOSIS — C3430 Malignant neoplasm of lower lobe, unspecified bronchus or lung: Secondary | ICD-10-CM

## 2015-11-12 ENCOUNTER — Ambulatory Visit
Admission: RE | Admit: 2015-11-12 | Discharge: 2015-11-12 | Disposition: A | Discharge status: Home / Self Care | Payer: PPO | Source: Ambulatory Visit | Attending: Cardiothoracic Surgery | Admitting: Cardiothoracic Surgery

## 2015-11-12 ENCOUNTER — Inpatient Hospital Stay: Payer: PPO | Attending: Cardiothoracic Surgery | Admitting: Cardiothoracic Surgery

## 2015-11-12 ENCOUNTER — Inpatient Hospital Stay (HOSPITAL_BASED_OUTPATIENT_CLINIC_OR_DEPARTMENT_OTHER): Payer: PPO | Admitting: Oncology

## 2015-11-12 VITALS — BP 128/74 | HR 92 | Temp 97.5°F | Ht 65.0 in | Wt 121.3 lb

## 2015-11-12 DIAGNOSIS — C73 Malignant neoplasm of thyroid gland: Secondary | ICD-10-CM | POA: Insufficient documentation

## 2015-11-12 DIAGNOSIS — Z79899 Other long term (current) drug therapy: Secondary | ICD-10-CM | POA: Insufficient documentation

## 2015-11-12 DIAGNOSIS — C3431 Malignant neoplasm of lower lobe, right bronchus or lung: Secondary | ICD-10-CM | POA: Insufficient documentation

## 2015-11-12 DIAGNOSIS — K219 Gastro-esophageal reflux disease without esophagitis: Secondary | ICD-10-CM | POA: Insufficient documentation

## 2015-11-12 DIAGNOSIS — C3430 Malignant neoplasm of lower lobe, unspecified bronchus or lung: Secondary | ICD-10-CM | POA: Diagnosis not present

## 2015-11-12 DIAGNOSIS — I10 Essential (primary) hypertension: Secondary | ICD-10-CM | POA: Insufficient documentation

## 2015-11-12 DIAGNOSIS — E785 Hyperlipidemia, unspecified: Secondary | ICD-10-CM | POA: Diagnosis not present

## 2015-11-12 DIAGNOSIS — J9811 Atelectasis: Secondary | ICD-10-CM | POA: Diagnosis not present

## 2015-11-12 DIAGNOSIS — R079 Chest pain, unspecified: Secondary | ICD-10-CM

## 2015-11-12 DIAGNOSIS — J90 Pleural effusion, not elsewhere classified: Secondary | ICD-10-CM | POA: Diagnosis not present

## 2015-11-12 DIAGNOSIS — R918 Other nonspecific abnormal finding of lung field: Secondary | ICD-10-CM

## 2015-11-12 NOTE — Progress Notes (Signed)
Removed sutures from chest tube insertion site, right flank.  Incision approximated, no signs and symptoms of infection.

## 2015-11-14 ENCOUNTER — Encounter: Payer: Self-pay | Admitting: Oncology

## 2015-11-14 NOTE — Progress Notes (Signed)
[place] @ [facility] [phone]  [phone]  Follow-up visit  [name] OB: 19-Jun-1946  MR#: [id number]  XAJ#:[id number]  Patient Care Team: Tonia Ghent, MD as PCP - General (Family Medicine) Beverly Gust, MD (Unknown Physician Specialty) Nestor Lewandowsky, MD as Referring Physician (Cardiothoracic Surgery)  CHIEF COMPLAINT:  No chief complaint on file.  1.  Abnormal CT scan with thyroid mass and a right lower lobe lung mass which is PET positive. 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  Needle biopsy of right lower lobe lung mass is positive for adenocarcinoma of lung.  T1 N0 M0 tumor diagnosis in November of 2016. 4.  Status post a right lower lobectomy.  Adenocarcinoma 1.8 cm.  Negative lymph nodes (December, 2016) . VISIT DIAGNOSIS:   Carcinoma of right lower lobe of lung   No history exists.      INTERVAL HISTORY:  56 never smoker underwent evaluation of abdominal pain in April 2016 with a CT abd that revealed a 4 mm nodule in her RML. In further evaluation of this finding, a CT chest was performed 08/11/15 with finding of a different nodule in superior segment of RLL adjacent to vertebrae. This was followed by PET scan demonstrating hypermetabolic activity in lung nodule as well as ipsilateral nodes and in thyroid gland. She is referred for further eval. The only symptom that she reports that might be attributable to these findings is pain in her back btw scapulae. Patient underwent EBUS and the biopsy was negative for lymph node involvement  Needle biopsy of the right lower lobe lung nodule is positive for adenocarcinoma. Patient underwent right lower lobectomy.  Tolerated procedure very well.  1.8 cm adenocarcinoma primary lung cancer negative lymph node.  Patient continues to have chest pain at the  incisional site. Requiring narcotic pain medication to control pain Here for further follow-up and treatment consideration REVIEW OF SYSTEMS:    PERFORMANCE STATUS (ECOG):  0 HEENT:  No visual changes, runny nose, sore throat, mouth sores or tenderness. Lungs: No shortness of breath or cough.  No hemoptysis. Cardiac:  No chest pain, palpitations, orthopnea, or PND. GI:  No nausea, vomiting, diarrhea, constipation, melena or hematochezia. GU:  No urgency, frequency, dysuria, or hematuria. Musculoskeletal:  No back pain.  No joint pain.  No muscle tenderness. Extremities:  No pain or swelling. Skin:  No rashes or skin changes. Neuro:  No headache, numbness or weakness, balance or coordination issues. Endocrine:  No diabetes, thyroid issues, hot flashes or night sweats. Psych:  No mood changes, depression or anxiety. Pain:  No focal pain. Review of systems:  All other systems reviewed and found to be negative.  As per HPI. Otherwise, a complete review of systems is negatve.  PAST MEDICAL HISTORY: Past Medical History  Diagnosis Date  . Hyperlipidemia   . Hypertension   . GERD (gastroesophageal reflux disease)   . Groin pain     Along superior/laterl R inguinal canal.  Could be due to hernia or old scar tissue.  prev with CT done w/o alarming findings.  Will treat episodically.  Use tylenol #3 prn.    . Normal cardiac stress test 2014  . PONV (postoperative nausea and vomiting)     nausea after hysterectomy  . Thyroid nodule   . Cancer of lung ([place]) 10/01/2015    PAST SURGICAL HISTORY: Past Surgical History  Procedure Laterality Date  . Esophagogastroduodenoscopy  2002, 11/15/07    Normal (Dr. Allyn Kenner)  . Total vaginal hysterectomy  12/30/08    for fibroid pain (Dr. Gertie Fey)  . Facial cosmetic surgery  01/13/10    mini facelift  . Breast mass excision  1977    benign  . Hernia repair  1976  . Carpal tunnel release Right 1978  . Dilation and curettage of uterus  2001  .  Colonoscopy  2009  . Abdominal hysterectomy  12-30-08  . Endobronchial ultrasound N/A 08/25/2015    Procedure: ENDOBRONCHIAL ULTRASOUND;  Surgeon: Flora Lipps, MD;  Location: [facility] ORS;  Service: Cardiopulmonary;  Laterality: N/A;  . Electromagnetic navigation brochoscopy N/A 08/25/2015    Procedure: ELECTROMAGNETIC NAVIGATION BRONCHOSCOPY;  Surgeon: Flora Lipps, MD;  Location: [facility] ORS;  Service: Cardiopulmonary;  Laterality: N/A;  . Thoracotomy/lobectomy Right 10/27/2015    Procedure: THORACOTOMY/LOBECTOMY;  Surgeon: Nestor Lewandowsky, MD;  Location: [facility] ORS;  Service: Thoracic;  Laterality: Right;    FAMILY HISTORY Family History  Problem Relation Age of Onset  . Transient ischemic attack Mother   . Hypertension Mother   . Stroke Mother     mini strokes  . Hypertension Sister   . Cancer Sister     ovarian  . Hypertension Sister   . Hypertension Sister   . Hypertension Sister   . Breast cancer Neg Hx   . Colon cancer Neg Hx        ADVANCED DIRECTIVES:  Patient does not have any living will or healthcare power of attorney.  Information was given .  Available resources had been discussed.  We will follow-up on subsequent appointments regarding this issue  HEALTH MAINTENANCE: Social History  Substance Use Topics  . Smoking status: Never Smoker   . Smokeless tobacco: Never Used  . Alcohol Use: No     Colonoscopy:  PAP:  Bone density:  Lipid panel:  Allergies  Allergen Reactions  . Atorvastatin     REACTION: myalgias  . Ciprofloxacin     REACTION: increase reflux  . Epinephrine     Heart racing with dental injection  . Metoprolol Succinate Swelling    Swelling and stiffness in hands and ankles  . Simvastatin Other (See Comments)    Muscle aches and stiffness  . Sulfonamide Derivatives     REACTION: itching    Current Outpatient Prescriptions  Medication Sig Dispense Refill  . albuterol (PROVENTIL) (2.5 MG/3ML) 0.083% nebulizer solution Take 3 mLs (2.5 mg total) by  nebulization every 6 (six) hours as needed for wheezing or shortness of breath. 75 mL 12  . Ascorbic Acid (VITAMIN C) 500 MG tablet Take 500 mg by mouth daily.      . Biotin (PA BIOTIN) 1000 MCG tablet Take 1,000 mcg by mouth daily.    . bisacodyl (DULCOLAX) 5 MG EC tablet Take 2 tablets (10 mg total) by mouth daily. 30 tablet 0  . Calcium Carbonate (CALCIUM 500 PO) Take by mouth daily.      . Cholecalciferol (VITAMIN D-3) 1000 UNITS CAPS Take 1 capsule by mouth daily.    . Coenzyme Q10 (COQ10) 50 MG CAPS Take 1 capsule by mouth daily.      . Folic Acid-Vit M5-HQI O96 (FA-VITAMIN B-6-VITAMIN B-12 PO) Take 1 tablet by mouth daily.      Marland Kitchen guaiFENesin (ROBITUSSIN) 100 MG/5ML liquid Take 200 mg by mouth 4 (four) times daily as needed for cough.    Marland Kitchen HYDROcodone-acetaminophen (NORCO/VICODIN) 5-325 MG tablet Take  1-2 tablets by mouth every 4 (four) hours as needed for moderate pain.    Marland Kitchen lisinopril (PRINIVIL,ZESTRIL) 2.5 MG tablet Take 1 tablet (2.5 mg total) by mouth daily. 90 tablet 3  . Nebulizers (COMPRESSOR/NEBULIZER) MISC 1 Package by Does not apply route every 6 (six) hours as needed. 1 each 0  . omeprazole (PRILOSEC) 40 MG capsule Take 1 capsule (40 mg total) by mouth daily. 90 capsule 3  . potassium chloride (KLOR-CON) 8 MEQ tablet Take 8 mEq by mouth daily.    . pravastatin (PRAVACHOL) 40 MG tablet TAKE 1 TABLET EVERY DAY 90 tablet 3  . [DISCONTINUED] calcium gluconate 500 MG tablet Take 500 mg by mouth daily.      . [DISCONTINUED] CALCIUM-MAG-VIT C-VIT D PO Take by mouth daily.      . [DISCONTINUED] metoprolol succinate (TOPROL-XL) 25 MG 24 hr tablet Take 25 mg by mouth at bedtime.      . [DISCONTINUED] simvastatin (ZOCOR) 40 MG tablet Take 40 mg by mouth at bedtime.       No current facility-administered medications for this visit.    OBJECTIVE: PHYSICAL EXAM: GENERAL:  Well developed, well nourished, sitting comfortably in the exam room in no acute distress. MENTAL STATUS:  Alert and  oriented to person, place and time. HEAD: .  Normocephalic, atraumatic, face symmetric, no Cushingoid features. EYES:.  Pupils equal round and reactive to light and accomodation.  No conjunctivitis or scleral icterus. ENT:  Oropharynx clear without lesion.  Tongue normal. Mucous membranes moist.  No palpable thyroid nodule RESPIRATORY:  Clear to auscultation without rales, wheezes or rhonchi. CARDIOVASCULAR:  Regular rate and rhythm without murmur, rub or gallop. BREAST:  Right breast without masses, skin changes or nipple discharge.  Left breast without masses, skin changes or nipple discharge. ABDOMEN:  Soft, non-tender, with active bowel sounds, and no hepatosplenomegaly.  No masses. BACK:  No CVA tenderness.  No tenderness on percussion of the back or rib cage. SKIN:  No rashes, ulcers or lesions. EXTREMITIES: No edema, no skin discoloration or tenderness.  No palpable cords. LYMPH NODES: No palpable cervical, supraclavicular, axillary or inguinal adenopathy  NEUROLOGICAL: Unremarkable. PSYCH:  Appropriate.  There were no vitals filed for this visit.   There is no weight on file to calculate BMI.    ECOG FS:0 - Asymptomatic  LAB RESULTS:  No visits with results within 5 Day(s) from this visit. Latest known visit with results is:  Admission on 10/27/2015, Discharged on 11/03/2015  Component Date Value Ref Range Status  . Order Confirmation 10/26/2015 ORDER PROCESSED BY BLOOD BANK   Final  . Order Confirmation 10/27/2015 ORDER PROCESSED BY BLOOD BANK   Final  . SURGICAL PATHOLOGY 10/27/2015    Final                   Value:Surgical Pathology CASE: [id number] PATIENT: [name] Surgical Pathology Report     SPECIMEN SUBMITTED: A. R-9 pulmonary ligament B. R-9 pulmonary ligagment #2 C. R-10 hilar D. R-10 hilar #2 E. R-10 hilar #3 F. R-11 interlobar G. Right lower lobe H. R-2 upper paratracheal I. R-4 lower paratracheal  CLINICAL HISTORY: None  provided  PRE-OPERATIVE DIAGNOSIS: Malignant neoplasm of lower right lobe  POST-OPERATIVE DIAGNOSIS: Same as pre-op    DIAGNOSIS: A. LYMPH NODE, PULMONARY LIGAMENT, R9; EXCISION: - NO TUMOR SEEN IN ONE LYMPH NODE.  B. LYMPH NODE, PULMONARY LIGAMENT #2, R9; EXCISION: - NO TUMOR SEEN IN ONE LYMPH NODE.  C. HILAR LYMPH NODE,  R10; EXCISION: - NO TUMOR SEEN IN ONE LYMPH NODE.  D. HILAR LYMPH NODE #2, R10; EXCISION: - NO TUMOR SEEN IN ONE LYMPH NODE.  E. HILAR LYMPH NODE #3, R10; EXCISION: - NO TUMOR SEEN IN ONE LYMPH NODE.  F. LYMPH NODE, INTERLOBAR, R11; EXCISION: - NO TUMOR SEEN IN ONE LYMPH NODE.  G. LUNG, RIGHT L                         OWER LOBE; LOBECTOMY: - ADENOCARCINOMA, LEPIDIC AND ACINAR PATTERNS. - THE SURGICAL MARGINS ARE NEGATIVE. - ONE BENIGN LYMPH NODE. - SEE SUMMARY BELOW.  H. LYMPH NODE, UPPER PARATRACHEAL, R2; EXCISION: - NO TUMOR SEEN (LYMPH NODE FRAGMENTS).  I.  LYMPH NODE, LOWER PARATRACHEAL, R4; EXCISION: - NO TUMOR SEEN (LYMPH NODE FRAGMENTS).   Specimens InvolvedA: R-9 pulmonary ligament B: R-9 pulmonary ligagment #2 C: R-10 hilar D: R-10 hilar #2 E: R-10 hilar #3 F: R-11 interlobar G: Right lower lobe H: R-2 upper paratracheal I: R-4 lower paratracheal  LUNG: Resection SPECIMEN Specimen: Lobe(s) of lung (specify) Right lower Procedure:     Lobectomy Specimen Integrity: Intact Specimen Laterality:     Right Tumor Site:    Lower lobe Tumor Focality:     Unifocal TUMOR Histologic Type:    Adenocarcinoma, acinar predominant EXTENT Tumor Size:    Greatest dimension (cm) 1.8cm Visceral Pleura Invasion:     Not identified Tumor Extension:    Not applicable MARGINS                          Bronchial Margin:   Uninvolved by invasive carcinoma and carcinoma in situ Vascular Margin:    Uninvolved by invasive carcinoma Parenchymal Margin: Not applicable ACCESSORY FINDINGS Lymph-Vascular Invasion: Not identified STAGE  (pTNM) Primary Tumor (pT): pT1a: Tumor 2 cm or less in greatest dimension, surrounded by lung or visceral pleura, without bronchoscopic evidence of invasion more proximal than the lobar bronchus (i.e., not in the main bronchus); or Superficial spreading tumor of any size with its invasive component limited to the bronchial wall, which may extend proximally to the main bronchus Regional Lymph Nodes (pN) pN0: No regional lymph node metastasis Number of Lymph Nodes Examined:    Specify 9 Number of Lymph Nodes Involved:    None identified Distant Metastases (pM): Not applicable  Note: Elastic stain confirms the lack of visceral pleural invasion. The control slide stained appropriately.   GROSS DESCRIPTION:  A. Labeled: right 9 pulmonary                          ligament  Tissue fragment(s): 1  Size: 0.6 x 0.4 x 0.2 cm  Description: gray fragment of tissue  Entirely submitted in one cassette(s).   B. Labeled: right 9 #2 pulmonary ligament  Tissue fragment(s): 1  Size: 0.8 x 0.5 x 0.2 cm  Description: tan to gray fragment of tissue  Entirely submitted in one cassette(s).   C. Labeled: R-10 hilar  Tissue fragment(s): 2  Size: 0.1 and 0.5 cm  Description: gray fragments, aggregate 0.5 x 0.4 x 0.1  Entirely submitted in one cassette(s).   D. Labeled: R-10 hilar #2  Tissue fragment(s): 1  Size: 0.6 x 0.5 x 0.1 cm  Description: gray fragment  Entirely submitted in one cassette(s).   E. Labeled: R-10 hilar #3  Tissue fragment(s): 1  Size: 0.6 x 0.8 x 0.3 cm  Description: gray fragment  Entirely submitted in one cassette(s).   F. Labeled: R-11 interlobular  Tissue fragment(s): 1  Size: 1.5 x 0.5 x 0.2 cm  Description: gray fragment  Entirely submitted in one cassette(s).   G. Labeled:                          right lower lobe  Type of procedure: lobe  Laterality: right  Weight of specimen: fresh, 165 grams  Size of specimen: 14.7 x  10.0 x 2.5 cm  Other attached structures: none grossly identified  Specimen integrity: grossly intact  Orientation: pleura overlying tumor-blue  Number of masses: 1  Size(s) of mass(es): 1.5 x 1.3 x 1.2 cm  Location of mass(es): peripheral  Description of mass(es): spiculated ill-defined tan  Relationship of mass(es) to bronchus: 3.1 cm from bronchial margin  Margins: Bronchial margin: 3.1 Parenchymal margin: not identified Other margins: no additional attached  Relationship / distance of mass(es) to pleura: retracting, less than 0.1 cm  Extension of mass(es): retracting pleura  Description of remainder of lung: crepitant red tan with a dilated honeycombing in the most inferior aspect of the lobe (1.5 x 1.0 x 0.5 cm) Comment: 4 photographs obtained  Block summary: 1-2-en face bronchial and vascular margins and possible hi                         lar lymph nodes  3-6-multiple representative sections of mass with pleura and adjacent vessels 7-area of honeycombing 8-9-additional representative parenchymal  H. Labeled: R-2 upper paratracheal  Tissue fragment(s): 2  Size: aggregate 0.7 x 0.4 x 0.1 cm  Description: gray fragments  Entirely submitted in one cassette(s).    I. Labeled: R-4 lower paratracheal  Tissue fragment(s): 2  Size: aggregate, 0.9 x 0.4 x 0.2 cm  Description: gray fragments  Entirely submitted in one cassette(s).    Final Diagnosis performed by Delorse Lek, MD.  Electronically signed 10/30/2015 1:41:40PM    The electronic signature indicates that the named Attending Pathologist has evaluated the specimen  Technical component performed at [facility], [address] [place], [place] [address] Lab: [phone] Dir: Darrick Penna. Evette Doffing, MD  Professional component performed at [facility], [facility], [place], [place], [place] [address] Lab: [phone]                          Dir: Dellia Nims. Rubinas, MD     . WBC 10/28/2015 11.1* 3.6 - 11.0 K/uL Final  . RBC 10/28/2015 4.02  3.80 - 5.20 MIL/uL Final  . Hemoglobin 10/28/2015 12.2  12.0 - 16.0 g/dL Final  . HCT 10/28/2015 36.1  35.0 - 47.0 % Final  . MCV 10/28/2015 89.8  80.0 - 100.0 fL Final  . MCH 10/28/2015 30.4  26.0 - 34.0 pg Final  . MCHC 10/28/2015 33.9  32.0 - 36.0 g/dL Final  . RDW 10/28/2015 14.0  11.5 - 14.5 % Final  . Platelets 10/28/2015 136* 150 - 440 K/uL Final  . Sodium 10/28/2015 134* 135 - 145 mmol/L Final  . Potassium 10/28/2015 4.7  3.5 - 5.1 mmol/L Final  . Chloride 10/28/2015 102  101 - 111 mmol/L Final  . CO2 10/28/2015 27  22 - 32 mmol/L Final  . Glucose, Bld 10/28/2015 175* 65 - 99 mg/dL Final  . BUN 10/28/2015 12  6 - 20 mg/dL Final  . Creatinine, Ser 10/28/2015 0.61  0.44 - 1.00 mg/dL Final  . Calcium 10/28/2015 8.0* 8.9 - 10.3 mg/dL Final  . Total Protein 10/28/2015 5.4* 6.5 - 8.1 g/dL Final  . Albumin 10/28/2015 3.2* 3.5 - 5.0 g/dL Final  . AST 10/28/2015 25  15 - 41 U/L Final  . ALT 10/28/2015 21  14 - 54 U/L Final  . Alkaline Phosphatase 10/28/2015 33* 38 - 126 U/L Final  . Total Bilirubin 10/28/2015 0.8  0.3 - 1.2 mg/dL Final  . GFR calc non Af Amer 10/28/2015 >60  >60 mL/min Final  . GFR calc Af Amer 10/28/2015 >60  >60 mL/min Final   Comment: (NOTE) The eGFR has been calculated using the CKD EPI equation. This calculation has not been validated in all clinical situations. eGFR's persistently <60 mL/min signify possible Chronic Kidney Disease.   . Anion gap 10/28/2015 5  5 - 15 Final  . Glucose-Capillary 10/27/2015 173* 65 - 99 mg/dL Final  . WBC 10/30/2015 8.1  3.6 - 11.0 K/uL Final  . RBC 10/30/2015 3.95  3.80 - 5.20 MIL/uL Final  . Hemoglobin 10/30/2015 11.8* 12.0 - 16.0 g/dL Final  . HCT 10/30/2015 35.4  35.0 - 47.0 % Final  . MCV 10/30/2015 89.6  80.0 - 100.0 fL Final  . MCH 10/30/2015 29.9  26.0 - 34.0 pg Final  . MCHC 10/30/2015 33.4  32.0 - 36.0 g/dL Final  . RDW 10/30/2015 14.3  11.5 -  14.5 % Final  . Platelets 10/30/2015 132* 150 - 440 K/uL Final  . Sodium 10/30/2015 139  135 - 145 mmol/L Final  . Potassium 10/30/2015 3.8  3.5 - 5.1 mmol/L Final  . Chloride 10/30/2015 107  101 - 111 mmol/L Final  . CO2 10/30/2015 29  22 - 32 mmol/L Final  . Glucose, Bld 10/30/2015 110* 65 - 99 mg/dL Final  . BUN 10/30/2015 5* 6 - 20 mg/dL Final  . Creatinine, Ser 10/30/2015 0.57  0.44 - 1.00 mg/dL Final  . Calcium 10/30/2015 8.2* 8.9 - 10.3 mg/dL Final  . GFR calc non Af Amer 10/30/2015 >60  >60 mL/min Final  . GFR calc Af Amer 10/30/2015 >60  >60 mL/min Final   Comment: (NOTE) The eGFR has been calculated using the CKD EPI equation. This calculation has not been validated in all clinical situations. eGFR's persistently <60 mL/min signify possible Chronic Kidney Disease.   . Anion gap 10/30/2015 3* 5 - 15 Final  . Color, Urine 10/29/2015 COLORLESS* YELLOW Final  . APPearance 10/29/2015 CLEAR* CLEAR Final  . Glucose, UA 10/29/2015 NEGATIVE  NEGATIVE mg/dL Final  . Bilirubin Urine 10/29/2015 NEGATIVE  NEGATIVE Final  . Ketones, ur 10/29/2015 NEGATIVE  NEGATIVE mg/dL Final  . Specific Gravity, Urine 10/29/2015 1.001* 1.005 - 1.030 Final  . Hgb urine dipstick 10/29/2015 NEGATIVE  NEGATIVE Final  . pH 10/29/2015 7.0  5.0 - 8.0 Final  . Protein, ur 10/29/2015 NEGATIVE  NEGATIVE mg/dL Final  . Nitrite 10/29/2015 NEGATIVE  NEGATIVE Final  . Leukocytes, UA 10/29/2015 NEGATIVE  NEGATIVE Final  . RBC / HPF 10/29/2015 NONE SEEN  0 - 5 RBC/hpf Final  . WBC, UA 10/29/2015 0-5  0 - 5 WBC/hpf Final  . Bacteria, UA 10/29/2015 NONE SEEN  NONE SEEN Final  . Squamous Epithelial / LPF 10/29/2015 NONE SEEN  NONE SEEN Final  . Mucous 10/29/2015 PRESENT   Final  . Specimen Description 10/29/2015 URINE, RANDOM   Final  . Special Requests 10/29/2015 NONE   Final  . Culture 10/29/2015 NO GROWTH  2 DAYS   Final  . Report Status 10/29/2015 10/31/2015 FINAL   Final  . WBC 11/02/2015 7.5  3.6 - 11.0 K/uL  Final  . RBC 11/02/2015 4.15  3.80 - 5.20 MIL/uL Final  . Hemoglobin 11/02/2015 12.8  12.0 - 16.0 g/dL Final  . HCT 11/02/2015 37.8  35.0 - 47.0 % Final  . MCV 11/02/2015 91.2  80.0 - 100.0 fL Final  . MCH 11/02/2015 30.9  26.0 - 34.0 pg Final  . MCHC 11/02/2015 33.9  32.0 - 36.0 g/dL Final  . RDW 11/02/2015 13.9  11.5 - 14.5 % Final  . Platelets 11/02/2015 190  150 - 440 K/uL Final  . Sodium 11/02/2015 140  135 - 145 mmol/L Final  . Potassium 11/02/2015 3.9  3.5 - 5.1 mmol/L Final  . Chloride 11/02/2015 103  101 - 111 mmol/L Final  . CO2 11/02/2015 32  22 - 32 mmol/L Final  . Glucose, Bld 11/02/2015 99  65 - 99 mg/dL Final  . BUN 11/02/2015 8  6 - 20 mg/dL Final  . Creatinine, Ser 11/02/2015 0.52  0.44 - 1.00 mg/dL Final  . Calcium 11/02/2015 8.7* 8.9 - 10.3 mg/dL Final  . GFR calc non Af Amer 11/02/2015 >60  >60 mL/min Final  . GFR calc Af Amer 11/02/2015 >60  >60 mL/min Final   Comment: (NOTE) The eGFR has been calculated using the CKD EPI equation. This calculation has not been validated in all clinical situations. eGFR's persistently <60 mL/min signify possible Chronic Kidney Disease.   . Anion gap 11/02/2015 5  5 - 15 Final  . Color, Urine 11/02/2015 YELLOW* YELLOW Final  . APPearance 11/02/2015 HAZY* CLEAR Final  . Glucose, UA 11/02/2015 NEGATIVE  NEGATIVE mg/dL Final  . Bilirubin Urine 11/02/2015 NEGATIVE  NEGATIVE Final  . Ketones, ur 11/02/2015 NEGATIVE  NEGATIVE mg/dL Final  . Specific Gravity, Urine 11/02/2015 1.006  1.005 - 1.030 Final  . Hgb urine dipstick 11/02/2015 NEGATIVE  NEGATIVE Final  . pH 11/02/2015 7.0  5.0 - 8.0 Final  . Protein, ur 11/02/2015 NEGATIVE  NEGATIVE mg/dL Final  . Nitrite 11/02/2015 POSITIVE* NEGATIVE Final  . Leukocytes, UA 11/02/2015 1+* NEGATIVE Final  . RBC / HPF 11/02/2015 0-5  0 - 5 RBC/hpf Final  . WBC, UA 11/02/2015 6-30  0 - 5 WBC/hpf Final  . Bacteria, UA 11/02/2015 NONE SEEN  NONE SEEN Final  . Squamous Epithelial / LPF  11/02/2015 0-5* NONE SEEN Final    STUDIES: Dg Chest 2 View  11/12/2015  CLINICAL DATA:  Right lung surgery. EXAM: CHEST  2 VIEW COMPARISON:  11/03/2015. FINDINGS: Interim removal of right chest tube. Tiny right apical pneumothorax is not definitely identified on today's exam. Mediastinum hilar structures are normal. Right lower lobe atelectasis and right pleural effusion noted. Heart size stable. No acute bony abnormality. IMPRESSION: 1. Interim removal of right chest tube. Previously identified tiny right apical pneumothorax is not identified on today's exam. 2. Right lower lobe atelectasis and right pleural effusion again noted . Electronically Signed   By: Marcello Moores  Register   On: 11/12/2015 11:38   Dg Chest 2 View  11/03/2015  CLINICAL DATA:  Postoperative with right chest tube. EXAM: CHEST  2 VIEW COMPARISON:  Chest radiograph from earlier today. FINDINGS: Right chest tube is stable in position with the tip in the upper lateral right pleural space. Stable cardiomediastinal silhouette with normal heart size. Tiny right apical pneumothorax is slightly decreased. No left pneumothorax. Stable volume loss in  the right hemithorax with elevated right hemidiaphragm. Stable trace right pleural effusion. No left pleural effusion. Mild right basilar atelectasis. No pulmonary edema. IMPRESSION: 1. Tiny right apical pneumothorax, slightly decreased. 2. Trace stable right pleural effusion. 3. Stable mild right basilar atelectasis. Electronically Signed   By: Ilona Sorrel M.D.   On: 11/03/2015 14:14   Dg Chest 2 View  11/03/2015  CLINICAL DATA:  Right-sided pneumothorax postoperative EXAM: CHEST  2 VIEW COMPARISON:  November 02, 2015 FINDINGS: There has been interval removal of the more inferiorly placed chest tube on the right. A second chest tube remains on the right with its tip in the periphery of the right upper lung zone. There is a persistent small right apical pneumothorax without tension component, unchanged.  There is elevation of the right hemidiaphragm with volume loss/atelectasis in the right base, stable. Lungs elsewhere clear. Heart size and pulmonary vascularity are normal. No adenopathy. No bone lesions. There is a drain in the lateral inferior right hemithorax region, stable. IMPRESSION: Stable postoperative change on the right. Small right apical pneumothorax without tension component, stable. Atelectasis right base. Left lung clear. No change in cardiac silhouette. Electronically Signed   By: Lowella Grip III M.D.   On: 11/03/2015 07:57   Dg Chest 2 View  11/02/2015  CLINICAL DATA:  Bronchogenic lung cancer. Thoracotomy 7 days ago. Chest pain. EXAM: CHEST  2 VIEW COMPARISON:  11/01/2015 and 10/30/2015 FINDINGS: Tiny right apical pneumothorax is unchanged. Two chest tubes appear in good position. Soft tissue drain in place. Left lung is clear except for slight linear scarring posterior medially. Heart size and vascularity are normal. Tiny right effusion. IMPRESSION: No change in tiny right apical pneumothorax.  Tiny right effusion. Electronically Signed   By: Lorriane Shire M.D.   On: 11/02/2015 09:28   Dg Chest 2 View  10/30/2015  CLINICAL DATA:  Status post right lobectomy on 10/27/2015. History of lung cancer. EXAM: CHEST  2 VIEW COMPARISON:  10/28/2015. FINDINGS: Two right chest tubes remain in place. A surgical drain is again demonstrated overlying the lateral right breast. Less than 5% right upper pneumothorax. Stable elevation the right hemidiaphragm and mild linear density at the lung bases. The interstitial markings remain mildly prominent. Grossly normal sized heart. Unremarkable bones. IMPRESSION: 1. Less than 5% right pneumothorax. 2. Elevated right hemidiaphragm with mild right basilar atelectasis and minimal left basilar atelectasis. 3. Mild chronic interstitial lung disease. These results will be called to the ordering clinician or representative by the Radiologist Assistant, and  communication documented in the PACS or zVision Dashboard. Electronically Signed   By: Claudie Revering M.D.   On: 10/30/2015 08:35   Dg Chest 2 View  10/20/2015  CLINICAL DATA:  Preop lobectomy. EXAM: CHEST  2 VIEW COMPARISON:  09/21/2015 FINDINGS: The heart size and mediastinal contours are within normal limits. Both lungs are clear. The visualized skeletal structures are unremarkable. IMPRESSION: No active cardiopulmonary disease. Electronically Signed   By: Rolm Baptise M.D.   On: 10/20/2015 13:18   Dg Chest Port 1 View  11/01/2015  CLINICAL DATA:  Status post thoracotomy. EXAM: PORTABLE CHEST 1 VIEW COMPARISON:  10/30/2015 FINDINGS: Right-sided chest tubes are in place. Surgical drain overlies the right breast shadow. Elevation of the right hemidiaphragm appears stable. Small right apical pneumothorax is identified, stable in appearance. There is minimal right basilar atelectasis. Left lung is clear. IMPRESSION: Small right apical pneumothorax is stable. Electronically Signed   By: Nolon Nations M.D.  On: 11/01/2015 09:18   Dg Chest Port 1 View  10/28/2015  CLINICAL DATA:  Post RIGHT lobectomy EXAM: PORTABLE CHEST 1 VIEW COMPARISON:  Portable exam 0750 hours compared to 10/27/2015 FINDINGS: RIGHT thoracostomy tubes unchanged. Enlargement of cardiac silhouette. Atherosclerotic calcification aorta. Mediastinal contours and pulmonary vascularity normal. Persistent elevation of RIGHT diaphragm and slight mediastinal shift to RIGHT compatible with preceding lobectomy and RIGHT hemithorax volume loss. Atelectasis at RIGHT lung base little changed. Linear subsegmental atelectasis LEFT lower lobe. Remaining lungs clear. No pleural effusion or pneumothorax. IMPRESSION: Stable postoperative changes of the RIGHT hemi thorax. Enlargement of cardiac silhouette. Bibasilar atelectasis. Electronically Signed   By: Lavonia Dana M.D.   On: 10/28/2015 08:02   Dg Chest Port 1 View  10/27/2015  CLINICAL DATA:   Immediately postop right lower lobectomy for lung cancer. EXAM: PORTABLE CHEST 1 VIEW COMPARISON:  10/20/2015 and earlier including PET-CT 08/14/2015. FINDINGS: Two right chest tubes in place with no evidence of pneumothorax. Post surgical changes related to right lower lobectomy. Mild atelectasis in the base of the remaining right lung. Mild left basilar atelectasis. Lungs otherwise clear. IMPRESSION: 1. No pneumothorax with 2 right chest tubes in place. 2. Mild bibasilar atelectasis. No acute cardiopulmonary disease otherwise. Electronically Signed   By: Evangeline Dakin M.D.   On: 10/27/2015 17:34    ASSESSMENT:  1.  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  ,  Patient underwent right lower lobectomy.  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  Pathology report has been reviewed Patient is recovering from surgery last chest x-rays been reviewed.  Patient continues to have chest pain at the site of the surgery Requiring Vicodin to control pain. I also discussed possibility of evaluation regarding thyroid surgery which can be planned in 4-6 weeks down the road after patient recovers from thoracic surgery. Appointment has been made to see Dr. Tami Ribas in 4 weeks       Patient expressed understanding and was in agreement with this plan. She also understands that She can call clinic at any time with any questions, concerns, or complaints.    No matching staging information was found for the patient.  Forest Gleason, MD   11/14/2015 3:37 PM

## 2015-11-17 ENCOUNTER — Ambulatory Visit: Payer: Commercial Managed Care - HMO | Admitting: Oncology

## 2015-11-27 LAB — SURGICAL PATHOLOGY

## 2015-11-30 ENCOUNTER — Emergency Department: Payer: PPO

## 2015-11-30 ENCOUNTER — Encounter: Payer: Self-pay | Admitting: Emergency Medicine

## 2015-11-30 ENCOUNTER — Emergency Department
Admission: EM | Admit: 2015-11-30 | Discharge: 2015-12-01 | Disposition: A | Discharge status: Home / Self Care | Payer: PPO | Attending: Emergency Medicine | Admitting: Emergency Medicine

## 2015-11-30 ENCOUNTER — Other Ambulatory Visit: Payer: Self-pay | Admitting: *Deleted

## 2015-11-30 DIAGNOSIS — R079 Chest pain, unspecified: Secondary | ICD-10-CM | POA: Diagnosis not present

## 2015-11-30 DIAGNOSIS — Z9089 Acquired absence of other organs: Secondary | ICD-10-CM | POA: Insufficient documentation

## 2015-11-30 DIAGNOSIS — R0781 Pleurodynia: Secondary | ICD-10-CM | POA: Insufficient documentation

## 2015-11-30 DIAGNOSIS — Z79899 Other long term (current) drug therapy: Secondary | ICD-10-CM | POA: Diagnosis not present

## 2015-11-30 DIAGNOSIS — I10 Essential (primary) hypertension: Secondary | ICD-10-CM | POA: Insufficient documentation

## 2015-11-30 DIAGNOSIS — G8918 Other acute postprocedural pain: Secondary | ICD-10-CM | POA: Diagnosis not present

## 2015-11-30 LAB — BASIC METABOLIC PANEL
Anion gap: 6 (ref 5–15)
BUN: 15 mg/dL (ref 6–20)
CO2: 28 mmol/L (ref 22–32)
Calcium: 9 mg/dL (ref 8.9–10.3)
Chloride: 102 mmol/L (ref 101–111)
Creatinine, Ser: 0.67 mg/dL (ref 0.44–1.00)
GFR calc Af Amer: >60 mL/min (ref >60)
GFR calc non Af Amer: >60 mL/min (ref >60)
Glucose, Bld: 107 mg/dL — ABNORMAL HIGH (ref 65–99)
Potassium: 4 mmol/L (ref 3.5–5.1)
Sodium: 136 mmol/L (ref 135–145)

## 2015-11-30 LAB — CBC
HCT: 39.3 % (ref 35.0–47.0)
Hemoglobin: 13 g/dL (ref 12.0–16.0)
MCH: 29.7 pg (ref 26.0–34.0)
MCHC: 33 g/dL (ref 32.0–36.0)
MCV: 89.8 fL (ref 80.0–100.0)
Platelets: 162 10*3/uL (ref 150–440)
RBC: 4.37 MIL/uL (ref 3.80–5.20)
RDW: 14.2 % (ref 11.5–14.5)
WBC: 8.5 10*3/uL (ref 3.6–11.0)

## 2015-11-30 LAB — TROPONIN I: Troponin I: <0.03 ng/mL (ref <0.031)

## 2015-11-30 MED ORDER — IBUPROFEN 600 MG PO TABS
600.0000 mg | ORAL_TABLET | ORAL | Status: AC
  Administered 2015-11-30: 600 mg via ORAL
  Filled 2015-11-30: qty 1

## 2015-11-30 MED ORDER — ONDANSETRON HCL 4 MG/2ML IJ SOLN
4.0000 mg | Freq: Once | INTRAMUSCULAR | Status: AC
  Administered 2015-11-30: 4 mg via INTRAVENOUS

## 2015-11-30 MED ORDER — MORPHINE SULFATE (PF) 4 MG/ML IV SOLN
4.0000 mg | Freq: Once | INTRAVENOUS | Status: AC
  Administered 2015-11-30: 4 mg via INTRAVENOUS
  Filled 2015-11-30: qty 1

## 2015-11-30 MED ORDER — IBUPROFEN 600 MG PO TABS: 600.0000 mg | ORAL_TABLET | Freq: Four times a day (QID) | ORAL | Status: DC | PRN

## 2015-11-30 MED ORDER — ONDANSETRON HCL 4 MG/2ML IJ SOLN
INTRAMUSCULAR | Status: AC
  Administered 2015-11-30: 4 mg via INTRAVENOUS
  Filled 2015-11-30: qty 2

## 2015-11-30 MED ORDER — HYDROCODONE-ACETAMINOPHEN 5-325 MG PO TABS
1.0000 | ORAL_TABLET | ORAL | Status: AC
  Administered 2015-11-30: 1 via ORAL
  Filled 2015-11-30: qty 1

## 2015-11-30 MED ORDER — FENTANYL CITRATE (PF) 100 MCG/2ML IJ SOLN
50.0000 ug | Freq: Once | INTRAMUSCULAR | Status: AC
  Administered 2015-11-30: 50 ug via NASAL
  Filled 2015-11-30: qty 2

## 2015-11-30 MED ORDER — ONDANSETRON HCL 4 MG/2ML IJ SOLN
4.0000 mg | Freq: Once | INTRAMUSCULAR | Status: AC
  Administered 2015-11-30: 4 mg via INTRAVENOUS
  Filled 2015-11-30: qty 2

## 2015-11-30 MED ORDER — IOHEXOL 350 MG/ML SOLN
60.0000 mL | Freq: Once | INTRAVENOUS | Status: AC | PRN
  Administered 2015-11-30: 60 mL via INTRAVENOUS

## 2015-11-30 MED ORDER — HYDROCODONE-ACETAMINOPHEN 5-325 MG PO TABS: 1.0000 | ORAL_TABLET | Freq: Four times a day (QID) | ORAL | Status: DC | PRN

## 2015-11-30 NOTE — ED Notes (Signed)
Patient transported to CT 

## 2015-11-30 NOTE — ED Notes (Signed)
Pt placed in family room to wait for room assignment .

## 2015-11-30 NOTE — ED Notes (Signed)
Per patient:  Right lobectomy on December 28th, increasing pain at surgical site since Friday, progressively worsening over Saturday and Sunday.  Vital signs stable, denies fever chills, denies shortness of breath.  No new drainage, no redness around surgical site.

## 2015-11-30 NOTE — Discharge Instructions (Signed)
°  Please follow-up with Dr. Faith Rogue via phone call tomorrow. Return to the emergency room right away if you have a fever, trouble breathing, increasing or severe pain which is not controlled, he develop abdominal pain, chest pressure, or other new concerns arise.  No driving while taking hydrocodone.

## 2015-11-30 NOTE — Telephone Encounter (Signed)
Paged Dr Genevive Bi and OR called back and said they will give him the message

## 2015-11-30 NOTE — ED Provider Notes (Signed)
[facility] Emergency Department Provider Note  ____________________________________________  Time seen: Approximately 10:07 PM  I have reviewed the triage vital signs and the nursing notes.   HISTORY  Chief Complaint Post-op Problem    HPI [name] is a 70 y.o. female presents for evaluation of right lower chest pain. Shereports that she's having pain around the area where she had surgery at the end of December. She had a pneumonectomy due to early stage lung cancer. Performed by Dr. Faith Rogue.pain started about 3 days ago.  She reports she is having a sharp achy pain that is worse with moving sitting somewhat with deep breathing over the right lower chest around the area of her score. No fevers chills cough, left-sided chest pain or pressure. She reports it is not quite at the same spot as her previous surgery.She denies feeling short of breath other than the pain takes her breath away at times. No previous history of blood clots.   Past Medical History  Diagnosis Date  . Hyperlipidemia   . Hypertension   . GERD (gastroesophageal reflux disease)   . Groin pain     Along superior/laterl R inguinal canal.  Could be due to hernia or old scar tissue.  prev with CT done w/o alarming findings.  Will treat episodically.  Use tylenol #3 prn.    . Normal cardiac stress test 2014  . PONV (postoperative nausea and vomiting)     nausea after hysterectomy  . Thyroid nodule   . Cancer of lung ([place]) 10/01/2015    Patient Active Problem List   Diagnosis Date Noted  . 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 Cancer of lung ([place]) 10/01/2015  . Thyroid cancer ([place]) 09/11/2015  . Adenopathy   . Solitary pulmonary nodule 02/01/2015  . Advance care planning 05/14/2014  . Chest pain 07/03/2013  . Thrombocytopenia, unspecified ([place]) 07/03/2013  . Osteopenia 05/05/2013  . Medicare annual wellness visit, subsequent 04/26/2012  . GERD (gastroesophageal reflux  disease) 04/26/2012  . Groin pain 01/12/2012  . Essential hypertension 11/17/2010  . HLD (hyperlipidemia) 01/31/2008    Past Surgical History  Procedure Laterality Date  . Esophagogastroduodenoscopy  2002, 11/15/07    Normal (Dr. Allyn Kenner)  . Total vaginal hysterectomy  12/30/08    for fibroid pain (Dr. Gertie Fey)  . Facial cosmetic surgery  01/13/10    mini facelift  . Breast mass excision  1977    benign  . Hernia repair  1976  . Carpal tunnel release Right 1978  . Dilation and curettage of uterus  2001  . Colonoscopy  2009  . Abdominal hysterectomy  12-30-08  . Endobronchial ultrasound N/A 08/25/2015    Procedure: ENDOBRONCHIAL ULTRASOUND;  Surgeon: Flora Lipps, MD;  Location: [facility] ORS;  Service: Cardiopulmonary;  Laterality: N/A;  . Electromagnetic navigation brochoscopy N/A 08/25/2015    Procedure: ELECTROMAGNETIC NAVIGATION BRONCHOSCOPY;  Surgeon: Flora Lipps, MD;  Location: [facility] ORS;  Service: Cardiopulmonary;  Laterality: N/A;  . Thoracotomy/lobectomy Right 10/27/2015    Procedure: THORACOTOMY/LOBECTOMY;  Surgeon: Nestor Lewandowsky, MD;  Location: [facility] ORS;  Service: Thoracic;  Laterality: Right;    Current Outpatient Rx  Name  Route  Sig  Dispense  Refill  . Ascorbic Acid (VITAMIN C) 500 MG tablet   Oral   Take 500 mg by mouth daily.           . Biotin (PA BIOTIN) 1000 MCG tablet   Oral   Take 1,000 mcg by mouth daily.         Marland Kitchen  bisacodyl (DULCOLAX) 5 MG EC tablet   Oral   Take 2 tablets (10 mg total) by mouth daily.   30 tablet   0   . Calcium Carbonate (CALCIUM 500 PO)   Oral   Take by mouth daily.           . Cholecalciferol (VITAMIN D-3) 1000 UNITS CAPS   Oral   Take 1 capsule by mouth daily.         . Coenzyme Q10 (COQ10) 50 MG CAPS   Oral   Take 1 capsule by mouth daily.           . Folic Acid-Vit L8-VFI E33 (FA-VITAMIN B-6-VITAMIN B-12 PO)   Oral   Take 1 tablet by mouth daily.           Marland Kitchen lisinopril (PRINIVIL,ZESTRIL) 2.5 MG tablet   Oral    Take 1 tablet (2.5 mg total) by mouth daily.   90 tablet   3   . omeprazole (PRILOSEC) 40 MG capsule   Oral   Take 1 capsule (40 mg total) by mouth daily.   90 capsule   3   . pravastatin (PRAVACHOL) 40 MG tablet      TAKE 1 TABLET EVERY DAY   90 tablet   3   . HYDROcodone-acetaminophen (NORCO/VICODIN) 5-325 MG tablet   Oral   Take 1-2 tablets by mouth every 6 (six) hours as needed for severe pain.   30 tablet   0   . ibuprofen (ADVIL,MOTRIN) 600 MG tablet   Oral   Take 1 tablet (600 mg total) by mouth every 6 (six) hours as needed.   30 tablet   0   . Nebulizers (COMPRESSOR/NEBULIZER) MISC   Does not apply   1 Package by Does not apply route every 6 (six) hours as needed. Patient not taking: Reported on 11/30/2015   1 each   0     Allergies Atorvastatin; Ciprofloxacin; Epinephrine; Metoprolol succinate; Simvastatin; and Sulfonamide derivatives  Family History  Problem Relation Age of Onset  . Transient ischemic attack Mother   . Hypertension Mother   . Stroke Mother     mini strokes  . Hypertension Sister   . Cancer Sister     ovarian  . Hypertension Sister   . Hypertension Sister   . Hypertension Sister   . Breast cancer Neg Hx   . Colon cancer Neg Hx     Social History Social History  Substance Use Topics  . Smoking status: Never Smoker   . Smokeless tobacco: Never Used  . Alcohol Use: No    Review of Systems Constitutional: No fever/chills Eyes: No visual changes. ENT: No sore throat. Cardiovascular: see history of present illnessRespiratory: Denies shortness of breath. Gastrointestinal: No abdominal pain.  No nausea, no vomiting.  No diarrhea.  No constipation.no pain in the right upper abdomen. Genitourinary: Negative for dysuria. Musculoskeletal: Negative for back pain. Skin: Negative for rash. Neurological: Negative for headaches, focal weakness or numbness.  10-point ROS otherwise  negative.  ____________________________________________   PHYSICAL EXAM:  VITAL SIGNS: ED Triage Vitals  Enc Vitals Group     BP 11/30/15 1840 127/72 mmHg     Pulse Rate 11/30/15 1840 80     Resp 11/30/15 1840 18     Temp 11/30/15 1840 98 F (36.7 C)     Temp Source 11/30/15 1840 Oral     SpO2 11/30/15 1840 98 %     Weight 11/30/15 1840 120 lb (54.432  kg)     Height 11/30/15 1840 '5\' 5"'$  (1.651 m)     Head Cir --      Peak Flow --      Pain Score 11/30/15 1844 10     Pain Loc --      Pain Edu? --      Excl. in [place]? --    Constitutional: Alert and oriented. Patient does appear to bein moderate discomfort especially withdeep inspirationEyes: Conjunctivae are normal. PERRL. EOMI. Head: Atraumatic. Nose: No congestion/rhinnorhea. Mouth/Throat: Mucous membranes are moist.  Oropharynx non-erythematous. Neck: No stridor.   Cardiovascular: Normal rate, regular rhythm. Grossly normal heart sounds.  Good peripheral circulation. Respiratory: Normal respiratory effortexcept for splinting with deep inspiration.  No retractions. Lungs CTABexcept slightly diminished over the right lower lobe.the patient does have old incisions and chest tube sites appear clean dry and intact. No rash. Gastrointestinal: Soft and nontender. No distention. No abdominal bruits. No CVA tenderness. Musculoskeletal: No lower extremity tenderness nor edema.  No joint effusions. Neurologic:  Normal speech and language. No gross focal neurologic deficits are appreciated. No gait instability. Skin:  Skin is warm, dry and intact. No rash noted. Psychiatric: Mood and affect are normal. Speech and behavior are normal.  ____________________________________________   LABS (all labs ordered are listed, but only abnormal results are displayed)  Labs Reviewed  BASIC METABOLIC PANEL - Abnormal; Notable for the following:    Glucose, Bld 107 (*)    All other components within normal limits  CBC  TROPONIN I    ____________________________________________  EKG  Reviewed and interpreted by me at 2200 Normal sinus rhythm Heart rate 80 No ST elevation QTc 440 QRS 80 PR 160 Reviewed and interpreted as normal sinus rhythm no acute ischemic abnml____________________________________________  RADIOLOGY  CT Angio Chest PE W/Cm &/Or Wo Cm (Final result) Result time: 11/30/15 22:49:58   Final result by Rad Results In Interface (11/30/15 22:49:58)   Narrative:   CLINICAL DATA: 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 Increasing pain at the surgical site since Friday.  EXAM: CT ANGIOGRAPHY CHEST WITH CONTRAST  TECHNIQUE: Multidetector CT imaging of the chest was performed using the standard protocol during bolus administration of intravenous contrast. Multiplanar CT image reconstructions and MIPs were obtained to evaluate the vascular anatomy.  CONTRAST: 13m OMNIPAQUE IOHEXOL 350 MG/ML SOLN  COMPARISON: 08/11/2015  FINDINGS: THORACIC INLET/BODY WALL:  Thoracotomy changes between the right seventh and eighth ribs with seventh rib intermittent periosteal reaction and sclerosis. There is no discrete fracture line. No abscess.  MEDIASTINUM:  Recent right lower lobectomy. There is irregularly shaped filling defect in the stump of the right lower lobe pulmonary artery consistent with thrombus. This presumably developed in situ. There is no other pulmonary artery filling defect suggestive of thromboembolic disease. No pericardial effusion. No acute aortic finding. No adenopathy.  LUNG WINDOWS:  Small right pleural effusion which is layering and simple appearing. No postoperative air leak. No edema or consolidation.  UPPER ABDOMEN:  No acute findings.  OSSEOUS:  Expected thoracotomy changes as noted above.  Review of the MIP images confirms the above findings.  IMPRESSION: 1. Recent right lower lobectomy. There is thrombus in the lower  lobe pulmonary artery stump but no other filling defect to suggest thromboembolic disease. 2. Small, simple right pleural effusion. Expected thoracotomy findings in the right chest wall.   Electronically Signed By: JMonte FantasiaM.D. On: 11/30/2015 22:49          DG Chest 2 View (  Final result) Result time: 11/30/15 19:21:54   Final result by Rad Results In Interface (11/30/15 19:21:54)   Narrative:   CLINICAL DATA: Right-sided chest pain. Status post right lower lobectomy.  EXAM: CHEST 2 VIEW  COMPARISON: 11/12/2015  FINDINGS: Postoperative change from right lower lobectomy identified. Stable from previous exam. Heart size is normal. No pleural effusion or edema. No pneumothorax identified.  IMPRESSION: 1. Stable radiograph of the chest status post right lower lobectomy.   Electronically Signed By: Kerby Moors M.D. On: 11/30/2015 19:21       ____________________________________________   PROCEDURES  Procedure(s) performed: None  Critical Care performed: No  ____________________________________________   INITIAL IMPRESSION / ASSESSMENT AND PLAN / ED COURSE  Pertinent labs & imaging results that were available during my care of the patient were reviewed by me and considered in my medical decision making (see chart for details).  leuritic right-sided chest pain.Appears to be primarily at the patient's old surgical site. Chest x-ray demonstrates no acute, she has no obvious infectious symptoms, leukocytosis, or productive cough. EKG reassuring and her symptoms are very atypical of cardiac.Suspect likely secondary to some sort ofscarring, or other, patient's running surgery.Tiny pneumothorax cannot be excluded by chest x-ray. Also the possibility of pulmonary embolism is considered.We will obtain CT imaging, I have paged and placed a consultation to general surgery for further evaluation and.  Overall the patient does appear to be  hemodynamically stable and is no clear distress. No wheezing or evidence to suggest reactive airway disease.No evidence of abdominal pain or pathology by history or exam.  ----------------------------------------- 12:08 AM on 12/01/2015 -----------------------------------------  CT scan discussed with Dr. Pat Patrick. Patient also tells me that she was seen and evaluated by Dr. Faith Rogue while she was in the waiting room, this was also discussed with Dr. Pat Patrick.  CT does not demonstrate acute abnormality other than a small pleural effusion in the area of pain. Thrombus noted per Dr. Pat Patrick is a normal postoperative finding and not of concern for embolic disease. Patient's pain is improved in the emergency room. I suspect likely pleuritic type pain secondary to surgery, no evidence of acute cardiac disease, pulmonary embolism, pneumothorax or major complication. Dr. Pat Patrick advises discharge. Discuss careful return precautions treatment recommendations and safe use of medication with the patient. She is agreeable. Friend will be driving her home.  She will follow up closely with Dr. Faith Rogue. I will refill her prescription for hydrocodone which she has previously been on, and states that it did well for pain control after her surgery.  I will prescribe the patient a narcotic pain medicine due to their condition which I anticipate will cause at least moderate pain short term. I discussed with the patient safe use of narcotic pain medicines, and that they are not to drive, work in dangerous areas, or ever take more than prescribed (no more than 1 pill every 6 hours). We discussed that this is the type of medication that can be  overdosed on and the risks of this type of medicine. Patient is very agreeable to only use as prescribed and to never use more than prescribed.  ____________________________________________   FINAL CLINICAL IMPRESSION(S) / ED DIAGNOSES  Final diagnoses:  Pleuritic pain      Delman Kitten,  MD 12/01/15 0010

## 2015-11-30 NOTE — ED Notes (Signed)
Patient transported to X-ray 

## 2015-12-01 ENCOUNTER — Telehealth: Payer: Self-pay | Admitting: Cardiothoracic Surgery

## 2015-12-01 NOTE — Telephone Encounter (Signed)
I believe this is an old message as the patient was seen in in ER last evening and was given pain med by ER doctor. I had paged Dr Genevive Bi yesterday regarding this same complaint and the message was given to him by OR staff

## 2015-12-01 NOTE — Telephone Encounter (Signed)
Pt had surgery 10/27/15. She was in horrible pain last night (11/30/15). Please call [phone].

## 2015-12-02 DIAGNOSIS — C73 Malignant neoplasm of thyroid gland: Secondary | ICD-10-CM

## 2015-12-02 HISTORY — DX: Malignant neoplasm of thyroid gland: C73

## 2015-12-10 ENCOUNTER — Other Ambulatory Visit: Payer: Self-pay | Admitting: *Deleted

## 2015-12-10 ENCOUNTER — Inpatient Hospital Stay: Payer: PPO | Attending: Cardiothoracic Surgery | Admitting: Cardiothoracic Surgery

## 2015-12-10 ENCOUNTER — Ambulatory Visit
Admission: RE | Admit: 2015-12-10 | Discharge: 2015-12-10 | Disposition: A | Discharge status: Home / Self Care | Payer: PPO | Source: Ambulatory Visit | Attending: Cardiothoracic Surgery | Admitting: Cardiothoracic Surgery

## 2015-12-10 VITALS — BP 112/76 | HR 86 | Temp 98.1°F | Wt 120.5 lb

## 2015-12-10 DIAGNOSIS — R918 Other nonspecific abnormal finding of lung field: Secondary | ICD-10-CM | POA: Diagnosis not present

## 2015-12-10 DIAGNOSIS — R05 Cough: Secondary | ICD-10-CM | POA: Diagnosis not present

## 2015-12-10 DIAGNOSIS — J90 Pleural effusion, not elsewhere classified: Secondary | ICD-10-CM | POA: Insufficient documentation

## 2015-12-10 DIAGNOSIS — C3490 Malignant neoplasm of unspecified part of unspecified bronchus or lung: Secondary | ICD-10-CM | POA: Diagnosis not present

## 2015-12-10 DIAGNOSIS — C3431 Malignant neoplasm of lower lobe, right bronchus or lung: Secondary | ICD-10-CM

## 2015-12-10 DIAGNOSIS — Z85118 Personal history of other malignant neoplasm of bronchus and lung: Secondary | ICD-10-CM | POA: Diagnosis not present

## 2015-12-10 DIAGNOSIS — E041 Nontoxic single thyroid nodule: Secondary | ICD-10-CM | POA: Diagnosis not present

## 2015-12-10 DIAGNOSIS — C3430 Malignant neoplasm of lower lobe, unspecified bronchus or lung: Secondary | ICD-10-CM

## 2015-12-10 NOTE — Progress Notes (Signed)
[name] Inpatient Post-Op Note  Patient ID: [name], female   DOB: April 25, 1946, 70 y.o.   MRN: [id number]  HISTORY: She returns today in follow-up. I saw her approximately 2 weeks ago in the emergency room when she complained of severe right lateral chest pain. A complete workup at that time did not reveal any specific etiology for her pain. She did have a chest x-ray made today which I have reviewed as well. She states that the pain is now markedly improved as is her cough. She still has some soreness in the inferior aspect of her chest wall and radiating over to the front under her breast. He is progressing however and is able to be more active.   Filed Vitals:   12/10/15 0959  BP: 112/76  Pulse: 86  Temp: 98.1 F (36.7 C)     EXAM: Resp: Lungs are clear bilaterally.  No respiratory distress, normal effort. Heart:  Regular without murmurs Abd:  Abdomen is soft, non distended and non tender. No masses are palpable.  There is no rebound and no guarding.  Neurological: Alert and oriented to person, place, and time. Coordination normal.  Skin: Skin is warm and dry. No rash noted. No diaphoretic. No erythema. No pallor.  Psychiatric: Normal mood and affect. Normal behavior. Judgment and thought content normal.    ASSESSMENT: I have independently reviewed the chest x-ray. It shows the expected postoperative changes only.   PLAN:   I will see her back again in 6 weeks for further evaluation. She will be seen today by Dr. Oliva Bustard as well.    Nestor Lewandowsky, MD

## 2015-12-11 ENCOUNTER — Telehealth: Payer: Self-pay

## 2015-12-11 NOTE — Telephone Encounter (Signed)
Spoke to pt and advised per Dr Damita Dunnings. Advised pt to contact office back with update regarding cough

## 2015-12-11 NOTE — Telephone Encounter (Signed)
Reasonable to have a trial off the medicine.  Lisinopril can cause a cough.  Thanks.

## 2015-12-11 NOTE — Telephone Encounter (Signed)
Pt left v/m requesting cb; pt saw Dr Tami Ribas on 12/10/15 and was advised might need to stop lisinopril; pt has dry hacky cough. Pt request Dr Josefine Class input. Last annual exam on 05/21/15.

## 2015-12-17 ENCOUNTER — Encounter
Admission: RE | Admit: 2015-12-17 | Discharge: 2015-12-17 | Disposition: A | Discharge status: Home / Self Care | Payer: PPO | Source: Ambulatory Visit | Attending: Unknown Physician Specialty | Admitting: Unknown Physician Specialty

## 2015-12-17 DIAGNOSIS — Z01818 Encounter for other preprocedural examination: Secondary | ICD-10-CM | POA: Diagnosis not present

## 2015-12-17 NOTE — Pre-Procedure Instructions (Signed)
EKG's from 11/30/15 and 08/21/15 given to S. Fields Therapist, sports to review for upcoming surgery.  OK to proceed.

## 2015-12-17 NOTE — Patient Instructions (Signed)
  Your procedure is scheduled on: Tuesday Feb. 28, 2017. Report to Same Day Surgery. To find out your arrival time please call [phone] between 1PM - 3PM on Monday Feb. 27, 2017.  Remember: Instructions that are not followed completely may result in serious medical risk, up to and including death, or upon the discretion of your surgeon and anesthesiologist your surgery may need to be rescheduled.    _x___ 1. Do not eat food or drink liquids after midnight. No gum chewing or  hard candies.     ____ 2. No Alcohol for 24 hours before or after surgery.   ____ 3. Bring all medications with you on the day of surgery if instructed.    __x__ 4. Notify your doctor if there is any change in your medical condition     (cold, fever, infections).     Do not wear jewelry, make-up, hairpins, clips or nail polish.  Do not wear lotions, powders, or perfumes. You may wear deodorant.  Do not shave 48 hours prior to surgery. Men may shave face and neck.  Do not bring valuables to the hospital.    [facility] is not responsible for any belongings or valuables.               Contacts, dentures or bridgework may not be worn into surgery.  Leave your suitcase in the car. After surgery it may be brought to your room.  For patients admitted to the hospital, discharge time is determined by your treatment team.   Patients discharged the day of surgery will not be allowed to drive home.    Please read over the following fact sheets that you were given:   [facility] Preparing for Surgery  _x___ Take these medicines the morning of surgery with A SIP OF WATER:    1. omeprazole (PRILOSEC)    ____ Fleet Enema (as directed)   _x___ Use CHG Soap as directed  ____ Use inhalers on the day of surgery  ____ Stop metformin 2 days prior to surgery    ____ Take 1/2 of usual insulin dose the night before surgery and none on the morning of surgery.   ____ Stop Coumadin/Plavix/aspirin on does not apply.    __x__ Stopped Anti-inflammatories Ibuprofen.  1 week ago. OK to take Tylenol or Hydrocodone for pain.   __x__ Stop supplements: Biotin (PA BIOTIN),  Coenzyme Q10 (COQ10),Ascorbic Acid (VITAMIN C) & Cholecalciferol (VITAMIN D-3)) until after surgery.    ____ Bring C-Pap to the hospital.

## 2015-12-24 ENCOUNTER — Other Ambulatory Visit: Payer: Self-pay | Admitting: *Deleted

## 2015-12-24 MED ORDER — HYDROCODONE-ACETAMINOPHEN 5-325 MG PO TABS: 1.0000 | ORAL_TABLET | Freq: Four times a day (QID) | ORAL | Status: DC | PRN

## 2015-12-24 NOTE — Telephone Encounter (Signed)
Refilled per L Herring, AGNP-C.  Informed that prescription is ready to pick up

## 2015-12-24 NOTE — Telephone Encounter (Addendum)
Reports that she is taking 3 tabs of hydrocodone/ APAP a day for pain she is still having in her ribs. Last got # 30 tabs on 11/30/15 by Dr Jacqualine Code

## 2015-12-29 ENCOUNTER — Ambulatory Visit: Payer: PPO | Admitting: Anesthesiology

## 2015-12-29 ENCOUNTER — Encounter: Payer: Self-pay | Admitting: *Deleted

## 2015-12-29 ENCOUNTER — Encounter
Admission: RE | Disposition: A | Discharge status: Home / Self Care | Payer: Self-pay | Source: Ambulatory Visit | Attending: Unknown Physician Specialty

## 2015-12-29 ENCOUNTER — Observation Stay
Admission: RE | Admit: 2015-12-29 | Discharge: 2015-12-30 | Disposition: A | Discharge status: Home / Self Care | Payer: PPO | Source: Ambulatory Visit | Attending: Unknown Physician Specialty | Admitting: Unknown Physician Specialty

## 2015-12-29 DIAGNOSIS — K219 Gastro-esophageal reflux disease without esophagitis: Secondary | ICD-10-CM | POA: Insufficient documentation

## 2015-12-29 DIAGNOSIS — Z823 Family history of stroke: Secondary | ICD-10-CM | POA: Diagnosis not present

## 2015-12-29 DIAGNOSIS — Z8349 Family history of other endocrine, nutritional and metabolic diseases: Secondary | ICD-10-CM | POA: Insufficient documentation

## 2015-12-29 DIAGNOSIS — E041 Nontoxic single thyroid nodule: Secondary | ICD-10-CM | POA: Diagnosis not present

## 2015-12-29 DIAGNOSIS — E785 Hyperlipidemia, unspecified: Secondary | ICD-10-CM | POA: Insufficient documentation

## 2015-12-29 DIAGNOSIS — C73 Malignant neoplasm of thyroid gland: Principal | ICD-10-CM | POA: Insufficient documentation

## 2015-12-29 DIAGNOSIS — Z881 Allergy status to other antibiotic agents status: Secondary | ICD-10-CM | POA: Insufficient documentation

## 2015-12-29 DIAGNOSIS — Z888 Allergy status to other drugs, medicaments and biological substances status: Secondary | ICD-10-CM | POA: Insufficient documentation

## 2015-12-29 DIAGNOSIS — Z9889 Other specified postprocedural states: Secondary | ICD-10-CM | POA: Diagnosis not present

## 2015-12-29 DIAGNOSIS — Z90710 Acquired absence of both cervix and uterus: Secondary | ICD-10-CM | POA: Insufficient documentation

## 2015-12-29 DIAGNOSIS — I10 Essential (primary) hypertension: Secondary | ICD-10-CM | POA: Insufficient documentation

## 2015-12-29 DIAGNOSIS — Z8249 Family history of ischemic heart disease and other diseases of the circulatory system: Secondary | ICD-10-CM | POA: Diagnosis not present

## 2015-12-29 DIAGNOSIS — Z882 Allergy status to sulfonamides status: Secondary | ICD-10-CM | POA: Insufficient documentation

## 2015-12-29 DIAGNOSIS — Z79899 Other long term (current) drug therapy: Secondary | ICD-10-CM | POA: Diagnosis not present

## 2015-12-29 DIAGNOSIS — Z79891 Long term (current) use of opiate analgesic: Secondary | ICD-10-CM | POA: Insufficient documentation

## 2015-12-29 DIAGNOSIS — E7800 Pure hypercholesterolemia, unspecified: Secondary | ICD-10-CM | POA: Diagnosis not present

## 2015-12-29 HISTORY — PX: THYROIDECTOMY: SHX17

## 2015-12-29 LAB — CALCIUM
Calcium: 8.6 mg/dL — ABNORMAL LOW (ref 8.9–10.3)
Calcium: 8.2 mg/dL — ABNORMAL LOW (ref 8.9–10.3)

## 2015-12-29 SURGERY — THYROIDECTOMY
Anesthesia: General | Wound class: Clean

## 2015-12-29 MED ORDER — ROCURONIUM BROMIDE 100 MG/10ML IV SOLN
INTRAVENOUS | Status: DC | PRN
  Administered 2015-12-29: 5 mg via INTRAVENOUS

## 2015-12-29 MED ORDER — FENTANYL CITRATE (PF) 100 MCG/2ML IJ SOLN
INTRAMUSCULAR | Status: AC
  Filled 2015-12-29: qty 2

## 2015-12-29 MED ORDER — ACETAMINOPHEN 650 MG RE SUPP: 650.0000 mg | RECTAL | Status: DC | PRN

## 2015-12-29 MED ORDER — FENTANYL CITRATE (PF) 100 MCG/2ML IJ SOLN
INTRAMUSCULAR | Status: DC | PRN
  Administered 2015-12-29 (×3): 50 ug via INTRAVENOUS
  Administered 2015-12-29: 100 ug via INTRAVENOUS
  Administered 2015-12-29 (×2): 50 ug via INTRAVENOUS

## 2015-12-29 MED ORDER — DEXTROSE-NACL 5-0.45 % IV SOLN
INTRAVENOUS | Status: DC
  Administered 2015-12-29 (×2): via INTRAVENOUS

## 2015-12-29 MED ORDER — ONDANSETRON HCL 4 MG/2ML IJ SOLN
4.0000 mg | INTRAMUSCULAR | Status: DC | PRN
  Administered 2015-12-30: 4 mg via INTRAVENOUS
  Filled 2015-12-29: qty 2

## 2015-12-29 MED ORDER — PROPOFOL 10 MG/ML IV BOLUS
INTRAVENOUS | Status: DC | PRN
  Administered 2015-12-29: 150 mg via INTRAVENOUS

## 2015-12-29 MED ORDER — PROPOFOL 500 MG/50ML IV EMUL
INTRAVENOUS | Status: DC | PRN
  Administered 2015-12-29: 100 ug/kg/min via INTRAVENOUS

## 2015-12-29 MED ORDER — LACTATED RINGERS IV SOLN
INTRAVENOUS | Status: DC
  Administered 2015-12-29 (×2): via INTRAVENOUS

## 2015-12-29 MED ORDER — BACITRACIN ZINC 500 UNIT/GM EX OINT
TOPICAL_OINTMENT | CUTANEOUS | Status: AC
  Filled 2015-12-29: qty 28.35

## 2015-12-29 MED ORDER — ACETAMINOPHEN 160 MG/5ML PO SOLN
650.0000 mg | ORAL | Status: DC | PRN
  Filled 2015-12-29: qty 20.3

## 2015-12-29 MED ORDER — LIDOCAINE HCL (CARDIAC) 20 MG/ML IV SOLN
INTRAVENOUS | Status: DC | PRN
  Administered 2015-12-29: 40 mg via INTRAVENOUS

## 2015-12-29 MED ORDER — LIDOCAINE-EPINEPHRINE 1 %-1:100000 IJ SOLN
INTRAMUSCULAR | Status: AC
  Filled 2015-12-29: qty 1

## 2015-12-29 MED ORDER — ONDANSETRON HCL 4 MG/2ML IJ SOLN: 4.0000 mg | Freq: Once | INTRAMUSCULAR | Status: DC | PRN

## 2015-12-29 MED ORDER — HYDROCODONE-ACETAMINOPHEN 5-325 MG PO TABS
1.0000 | ORAL_TABLET | ORAL | Status: DC | PRN
  Administered 2015-12-29 – 2015-12-30 (×4): 1 via ORAL
  Filled 2015-12-29: qty 2
  Filled 2015-12-29 (×3): qty 1

## 2015-12-29 MED ORDER — ONDANSETRON HCL 4 MG PO TABS: 4.0000 mg | ORAL_TABLET | ORAL | Status: DC | PRN

## 2015-12-29 MED ORDER — FENTANYL CITRATE (PF) 100 MCG/2ML IJ SOLN
25.0000 ug | INTRAMUSCULAR | Status: AC | PRN
  Administered 2015-12-29 (×6): 25 ug via INTRAVENOUS

## 2015-12-29 MED ORDER — PANTOPRAZOLE SODIUM 40 MG PO TBEC
40.0000 mg | DELAYED_RELEASE_TABLET | Freq: Every day | ORAL | Status: DC
  Administered 2015-12-29: 40 mg via ORAL
  Filled 2015-12-29: qty 1

## 2015-12-29 MED ORDER — CALCIUM CARBONATE-VITAMIN D 500-200 MG-UNIT PO TABS
2.0000 | ORAL_TABLET | Freq: Two times a day (BID) | ORAL | Status: DC
  Administered 2015-12-29 (×2): 2 via ORAL
  Filled 2015-12-29 (×2): qty 2

## 2015-12-29 MED ORDER — NALOXONE HCL 0.4 MG/ML IJ SOLN
INTRAMUSCULAR | Status: DC | PRN
  Administered 2015-12-29 (×2): 40 ug via INTRAVENOUS

## 2015-12-29 MED ORDER — DEXAMETHASONE SODIUM PHOSPHATE 10 MG/ML IJ SOLN
INTRAMUSCULAR | Status: DC | PRN
  Administered 2015-12-29: 10 mg via INTRAVENOUS

## 2015-12-29 MED ORDER — LIDOCAINE-EPINEPHRINE 1 %-1:100000 IJ SOLN
INTRAMUSCULAR | Status: DC | PRN
  Administered 2015-12-29: 3 mL

## 2015-12-29 MED ORDER — MIDAZOLAM HCL 2 MG/2ML IJ SOLN
INTRAMUSCULAR | Status: DC | PRN
  Administered 2015-12-29: 2 mg via INTRAVENOUS

## 2015-12-29 MED ORDER — BACITRACIN ZINC 500 UNIT/GM EX OINT
1.0000 "application " | TOPICAL_OINTMENT | Freq: Three times a day (TID) | CUTANEOUS | Status: DC
  Administered 2015-12-29 – 2015-12-30 (×3): 1 via TOPICAL
  Filled 2015-12-29 (×3): qty 0.9

## 2015-12-29 MED ORDER — SUCCINYLCHOLINE CHLORIDE 20 MG/ML IJ SOLN
INTRAMUSCULAR | Status: DC | PRN
Start: 2015-12-29 — End: 2015-12-29
  Administered 2015-12-29: 100 mg via INTRAVENOUS

## 2015-12-29 MED ORDER — ONDANSETRON HCL 4 MG/2ML IJ SOLN
INTRAMUSCULAR | Status: DC | PRN
  Administered 2015-12-29: 4 mg via INTRAVENOUS

## 2015-12-29 MED ORDER — HYDROMORPHONE HCL 1 MG/ML IJ SOLN: 0.2500 mg | INTRAMUSCULAR | Status: DC | PRN

## 2015-12-29 MED ORDER — PHENYLEPHRINE HCL 10 MG/ML IJ SOLN
INTRAMUSCULAR | Status: DC | PRN
  Administered 2015-12-29 (×2): 100 ug via INTRAVENOUS

## 2015-12-29 SURGICAL SUPPLY — 35 items
BLADE SURG 15 STRL LF DISP TIS (BLADE) ×1 IMPLANT
BLADE SURG 15 STRL SS (BLADE) ×1
CANISTER SUCT 1200ML W/VALVE (MISCELLANEOUS) ×2 IMPLANT
CORD BIP STRL DISP 12FT (MISCELLANEOUS) ×2 IMPLANT
DRAIN TLS ROUND 10FR (DRAIN) IMPLANT
DRAPE MAG INST 16X20 L/F (DRAPES) ×2 IMPLANT
DRSG TEGADERM 2-3/8X2-3/4 SM (GAUZE/BANDAGES/DRESSINGS) ×2 IMPLANT
ELECT LARYNGEAL 6/7 (MISCELLANEOUS) ×2
ELECT LARYNGEAL 8/9 (MISCELLANEOUS) ×2
ELECT REM PT RETURN 9FT ADLT (ELECTROSURGICAL) ×2
ELECTRODE LARYNGEAL 6/7 (MISCELLANEOUS) ×1 IMPLANT
ELECTRODE LARYNGEAL 8/9 (MISCELLANEOUS) ×1 IMPLANT
ELECTRODE REM PT RTRN 9FT ADLT (ELECTROSURGICAL) ×1 IMPLANT
FORCEPS JEWEL BIP 4-3/4 STR (INSTRUMENTS) ×2 IMPLANT
GLOVE BIO SURGEON STRL SZ7.5 (GLOVE) ×4 IMPLANT
GOWN STRL REUS W/ TWL LRG LVL3 (GOWN DISPOSABLE) ×3 IMPLANT
GOWN STRL REUS W/TWL LRG LVL3 (GOWN DISPOSABLE) ×6
HARMONIC SCALPEL FOCUS (MISCELLANEOUS) ×2 IMPLANT
HEMOSTAT SURGICEL 2X3 (HEMOSTASIS) ×2 IMPLANT
HOOK STAY 5M SHARP BLUNT 3316- (MISCELLANEOUS) ×2 IMPLANT
KIT RM TURNOVER STRD PROC AR (KITS) ×2 IMPLANT
LABEL OR SOLS (LABEL) ×2 IMPLANT
LIQUID BAND (GAUZE/BANDAGES/DRESSINGS) ×2 IMPLANT
NS IRRIG 500ML POUR BTL (IV SOLUTION) ×2 IMPLANT
PACK HEAD/NECK (MISCELLANEOUS) ×2 IMPLANT
PROBE NEUROSIGN BIPOL (MISCELLANEOUS) ×1 IMPLANT
PROBE NEUROSIGN BIPOLAR (MISCELLANEOUS) ×2
SPONGE KITTNER 5P (MISCELLANEOUS) ×2 IMPLANT
SPONGE XRAY 4X4 16PLY STRL (MISCELLANEOUS) ×4 IMPLANT
STAPLER SKIN PROX 35W (STAPLE) ×2 IMPLANT
SUT SILK 2 0 (SUTURE) ×2
SUT SILK 2 0 SH (SUTURE) ×2 IMPLANT
SUT SILK 2-0 18XBRD TIE 12 (SUTURE) ×1 IMPLANT
SUT VIC AB 4-0 RB1 18 (SUTURE) ×6 IMPLANT
SYSTEM CHEST DRAIN TLS 7FR (DRAIN) ×4 IMPLANT

## 2015-12-29 NOTE — Anesthesia Procedure Notes (Signed)
Procedure Name: Intubation Date/Time: 12/29/2015 7:30 AM Performed by: Allean Found Pre-anesthesia Checklist: Patient identified, Emergency Drugs available, Suction available, Patient being monitored and Timeout performed Patient Re-evaluated:Patient Re-evaluated prior to inductionOxygen Delivery Method: Circle system utilized Preoxygenation: Pre-oxygenation with 100% oxygen Intubation Type: IV induction Ventilation: Mask ventilation without difficulty Laryngoscope Size: McGraph and 3 Grade View: Grade I Tube type: Oral Tube size: 7.0 mm Number of attempts: 1 Airway Equipment and Method: Stylet Placement Confirmation: ETT inserted through vocal cords under direct vision,  positive ETCO2 and breath sounds checked- equal and bilateral Secured at: 21 cm Tube secured with: Tape Dental Injury: Teeth and Oropharynx as per pre-operative assessment  Future Recommendations: Recommend- induction with short-acting agent, and alternative techniques readily available

## 2015-12-29 NOTE — H&P (Signed)
  H+P  Reviewed and will be scanned in later. No changes noted. 

## 2015-12-29 NOTE — Progress Notes (Signed)
Stat Calcium level drawn in PACU @ [phone]

## 2015-12-29 NOTE — Transfer of Care (Signed)
Immediate Anesthesia Transfer of Care Note  Patient: [name]  Procedure(s) Performed: Procedure(s): THYROIDECTOMY (N/A)  Patient Location: PACU  Anesthesia Type:General  Level of Consciousness: sedated  Airway & Oxygen Therapy: Patient Spontanous Breathing and Patient connected to face mask oxygen  Post-op Assessment: Report given to RN and Post -op Vital signs reviewed and stable  Post vital signs: Reviewed and stable  Last Vitals:  Filed Vitals:   12/29/15 0612  BP: 129/76  Pulse: 97  Temp: 36.6 C  Resp: 18    Complications: No apparent anesthesia complications

## 2015-12-29 NOTE — Progress Notes (Signed)
12/29/2015 5:09 PM  [name] [id number]  Post-Op Day 0    Temp:  [97.8 F (36.6 C)-98.7 F (37.1 C)] 98.1 F (36.7 C) (02/28 1221) Pulse Rate:  [75-110] 78 (02/28 1221) Resp:  [12-18] 17 (02/28 1221) BP: (127-149)/(61-97) 127/61 mmHg (02/28 1221) SpO2:  [91 %-100 %] 97 % (02/28 1221) Weight:  [54.432 kg (120 lb)] 54.432 kg (120 lb) (02/28 0612),     Intake/Output Summary (Last 24 hours) at 12/29/15 1709 Last data filed at 12/29/15 1600  Gross per 24 hour  Intake   2320 ml  Output   1105 ml  Net   1215 ml    Results for orders placed or performed during the hospital encounter of 12/29/15 (from the past 24 hour(s))  Calcium     Status: Abnormal   Collection Time: 12/29/15  9:16 AM  Result Value Ref Range   Calcium 8.6 (L) 8.9 - 10.3 mg/dL  Calcium     Status: Abnormal   Collection Time: 12/29/15  4:03 PM  Result Value Ref Range   Calcium 8.2 (L) 8.9 - 10.3 mg/dL    SUBJECTIVE:  Feeling fine, good appetite, throat a little sore  OBJECTIVE:  Neck looks great, no hematoma, drains intact  IMPRESSION:  S/p total thyroidectomy  PLAN:  Follow Ca tonight anticipate DC in am if stable.    Izabela Ow T 12/29/2015, 5:09 PM

## 2015-12-29 NOTE — Anesthesia Preprocedure Evaluation (Signed)
Anesthesia Evaluation  Patient identified by MRN, date of birth, ID band Patient awake    Reviewed: Allergy & Precautions, H&P , NPO status , Patient's Chart, lab work & pertinent test results, reviewed documented beta blocker date and time   History of Anesthesia Complications (+) PONV and history of anesthetic complications  Airway Mallampati: II  TM Distance: >3 FB Neck ROM: full    Dental  (+) Teeth Intact   Pulmonary neg shortness of breath,  Recent thoracotomy for wedge resection of RLL CA   Pulmonary exam normal        Cardiovascular hypertension, negative cardio ROS Normal cardiovascular exam Rhythm:regular Rate:Normal     Neuro/Psych negative neurological ROS  negative psych ROS   GI/Hepatic negative GI ROS, Neg liver ROS, GERD  ,  Endo/Other  negative endocrine ROS  Renal/GU negative Renal ROS  negative genitourinary   Musculoskeletal   Abdominal   Peds  Hematology negative hematology ROS (+)   Anesthesia Other Findings Past Medical History:   Hyperlipidemia                                               Hypertension                                                 GERD (gastroesophageal reflux disease)                       Groin pain                                                     Comment:Along superior/laterl R inguinal canal.  Could               be due to hernia or old scar tissue.  prev with              CT done w/o alarming findings.  Will treat               episodically.  Use tylenol #3 prn.     Normal cardiac stress test                      2014         Thyroid nodule                                               Cancer of lung ([place])                            10/01/2015    PONV (postoperative nausea and vomiting)                       Comment:nausea after hysterectomy, and 2 days after               lung surgery 10/2015 Past Surgical History:  ESOPHAGOGASTRODUODENOSCOPY                        2002, 1/1*     Comment:Normal (Dr. Allyn Kenner)   TOTAL VAGINAL HYSTERECTOMY                       12/30/08         Comment:for fibroid pain (Dr. Gertie Fey)   FACIAL COSMETIC SURGERY                          01/13/10        Comment:mini facelift   BREAST MASS EXCISION                             1977           Comment:benign   HERNIA REPAIR                                    1976         CARPAL TUNNEL RELEASE                           Right 1978         DILATION AND CURETTAGE OF UTERUS                 2001         COLONOSCOPY                                      2009         ABDOMINAL HYSTERECTOMY                           12-30-08       ENDOBRONCHIAL ULTRASOUND                        N/A 08/25/2015     Comment:Procedure: ENDOBRONCHIAL ULTRASOUND;  Surgeon:               Flora Lipps, MD;  Location: [facility];  Service:              Cardiopulmonary;  Laterality: N/A;   ELECTROMAGNETIC NAVIGATION BROCHOSCOPY          N/A 08/25/2015     Comment:Procedure: ELECTROMAGNETIC NAVIGATION               BRONCHOSCOPY;  Surgeon: Flora Lipps, MD;                Location: [facility];  Service: Cardiopulmonary;               Laterality: N/A;   THORACOTOMY/LOBECTOMY                           Right 10/27/2015     Comment:Procedure: THORACOTOMY/LOBECTOMY;  Surgeon:               Nestor Lewandowsky, MD;  Location: [facility];                Service: Thoracic;  Laterality: Right; BMI    Body Mass  Index   19.96 kg/m 2     Reproductive/Obstetrics negative OB ROS                             Anesthesia Physical Anesthesia Plan  ASA: III  Anesthesia Plan: General ETT   Post-op Pain Management:    Induction: Intravenous  Airway Management Planned: Video Laryngoscope Planned  Additional Equipment:   Intra-op Plan:   Post-operative Plan:   Informed Consent: I have reviewed the patients History and Physical, chart, labs and discussed the procedure including the risks, benefits and  alternatives for the proposed anesthesia with the patient or authorized representative who has indicated his/her understanding and acceptance.   Dental Advisory Given  Plan Discussed with: CRNA  Anesthesia Plan Comments:         Anesthesia Quick Evaluation

## 2015-12-29 NOTE — Anesthesia Postprocedure Evaluation (Signed)
Anesthesia Post Note  Patient: [name]  Procedure(s) Performed: Procedure(s) (LRB): THYROIDECTOMY (N/A)  Patient location during evaluation: PACU Anesthesia Type: General Level of consciousness: awake and alert Pain management: pain level controlled Vital Signs Assessment: post-procedure vital signs reviewed and stable Respiratory status: spontaneous breathing, nonlabored ventilation, respiratory function stable and patient connected to nasal cannula oxygen Cardiovascular status: blood pressure returned to baseline and stable Postop Assessment: no signs of nausea or vomiting Anesthetic complications: no    Last Vitals:  Filed Vitals:   12/29/15 1016 12/29/15 1112  BP: 133/81 130/61  Pulse: 75 80  Temp:  36.6 C  Resp: 12 16    Last Pain:  Filed Vitals:   12/29/15 1111  PainSc: [place]

## 2015-12-29 NOTE — Op Note (Signed)
12/29/2015  8:55 AM    [name]  [id number]   Pre-Op Dx: Thyroid nodule  Post-op Dx: SAME  Proc: Total thyroidectomy : Laryngeal nerve monitoring 2 hours  Surg:  [name]   Asst.: juengel  Anes:  GOT  EBL:  Less than 20 cc  Comp:  None  Findings:  Multiple small nodules right lobe thyroid  Procedure: Miss conchal was identified in the holding area taken the operating room placed in supine position. After general endotracheal anesthesia with a laryngeal nerve monitor in place a skin incision was marked in a natural skin crease. A local anesthetic of 1% lidocaine with 1-100,000 units of epinephrine was used to inject along the incision line. A total of 3 cc was used with the neck gently extended was prepped and draped sterilely a 15 blade was used to incise down through into the platysma muscle. Hemostasis was achieved using the Bovie cautery. Strap muscles were divided in the midline beginning on the right-hand side dissection proceeded around the thyroid lobe. Multiple feeding vessels were divided using the Harmonic scalpel. The superior pole thyroid was identified and isolated and divided using the Harmonic scalpel. Similarly laterality gland to be medialized the recurrent laryngeal nerve was identified and stimulated and remained intact throughout the procedure. Severe inferior parathyroid glands were also identified and left vascular vascular pedicles intact. The gland was then medialized other 2 small nodules within the gland the largest measuring approximately 1-1/2 cm. The gland was then further medialized berry's ligament was released the inferior pole vasculature was also isolated and divided using the Harmonic scalpel. With the right lobe dissected free attention then turned to the left lobe again the strap muscles were dissected free and the gland was gently medialized. The superior pole was isolated and divided using Harmonic scalpel. The gland was medialized  recurrent laryngeal nerve was identified in the tracheoesophageal groove was stimulated and left intact throughout the procedure. The superior and inferior parathyroid glands were also identified on the left and left intact with her vascular pedicle. Berry's ligament was released and allowed the gland to be peeled off the anterior tracheal wall thus removing the gland in its entirety. A stitch was placed in the right superior pole for marking. The wounds and copious irrigated with saline any small bleeding points were cauterized using the microbipolar both recurrent laryngeal nerves were stimulated at the end of the case and stimulation was intact. Surgicel was then placed in the neurovascular beds and 2 #7 TLS drains were brought out of the wound inferiorly. The strap muscles were reapproximated using 4-0 Vicryl the platysma was closed using 4-0 Vicryl subcutaneous taste tissues closed using 4-0 Vicryl and the skin was closed using Dermabond. The patient was in return anesthesia where she was able awakened in the operating room and taken recovery room in stable condition   Dispo:  Good Specimens: Total Thyroid  Cultures: None   plan: Overnight admission for monitoring of calcium.  [name]  12/29/2015 8:55 AM

## 2015-12-30 DIAGNOSIS — C73 Malignant neoplasm of thyroid gland: Secondary | ICD-10-CM | POA: Diagnosis not present

## 2015-12-30 LAB — SURGICAL PATHOLOGY

## 2015-12-30 LAB — CALCIUM: Calcium: 8.8 mg/dL — ABNORMAL LOW (ref 8.9–10.3)

## 2015-12-30 MED ORDER — HYDROCODONE-ACETAMINOPHEN 5-325 MG PO TABS: 1.0000 | ORAL_TABLET | ORAL | Status: DC | PRN

## 2015-12-30 MED ORDER — CALCIUM CARBONATE-VITAMIN D 500-200 MG-UNIT PO TABS: 2.0000 | ORAL_TABLET | Freq: Two times a day (BID) | ORAL | Status: DC

## 2015-12-30 NOTE — Discharge Summary (Cosign Needed)
12/30/2015 8:13 AM  Drucie Opitz [id number]  Post-Op Day 1    Temp:  [97.8 F (36.6 C)-98.7 F (37.1 C)] 98.4 F (36.9 C) (03/01 0421) Pulse Rate:  [67-110] 69 (03/01 0421) Resp:  [12-20] 20 (03/01 0421) BP: (103-149)/(52-97) 110/52 mmHg (03/01 0421) SpO2:  [91 %-100 %] 98 % (03/01 0421),     Intake/Output Summary (Last 24 hours) at 12/30/15 0813 Last data filed at 12/30/15 0500  Gross per 24 hour  Intake 3668.61 ml  Output   2835 ml  Net 833.61 ml    Results for orders placed or performed during the hospital encounter of 12/29/15 (from the past 24 hour(s))  Calcium     Status: Abnormal   Collection Time: 12/29/15  9:16 AM  Result Value Ref Range   Calcium 8.6 (L) 8.9 - 10.3 mg/dL  Calcium     Status: Abnormal   Collection Time: 12/29/15  4:03 PM  Result Value Ref Range   Calcium 8.2 (L) 8.9 - 10.3 mg/dL  Calcium     Status: Abnormal   Collection Time: 12/30/15  4:57 AM  Result Value Ref Range   Calcium 8.8 (L) 8.9 - 10.3 mg/dL    SUBJECTIVE:  Doing well, mild reflux.  Calcium stable, home today  OBJECTIVE:  Neck w/o hematoma, drains removed  IMPRESSION:  S/p thyroidectomy-doing well.    PLAN:  Home today  Gedalia Mcmillon T 12/30/2015, 8:13 AM

## 2015-12-30 NOTE — Progress Notes (Signed)
12/30/2015  9:30  Drucie Opitz to be D/C'd Home per MD order.  Discussed prescriptions and follow up appointments with the patient. Prescriptions given to patient, medication list explained in detail. Pt verbalized understanding.    Medication List    STOP taking these medications        CALCIUM 500 PO     lisinopril 2.5 MG tablet  Commonly known as:  PRINIVIL,ZESTRIL     potassium gluconate 595 (99 K) MG Tabs tablet     Vitamin D-3 1000 units Caps      TAKE these medications        calcium-vitamin D 500-200 MG-UNIT tablet  Commonly known as:  OSCAL WITH D  Take 2 tablets by mouth 2 (two) times daily.     CoQ10 50 MG Caps  Take 1 capsule by mouth every evening. 200 mg     docusate sodium 100 MG capsule  Commonly known as:  COLACE  Take 100 mg by mouth every evening. 2 capsules     FA-VITAMIN B-6-VITAMIN B-12 PO  1 tablet daily after breakfast.     HYDROcodone-acetaminophen 5-325 MG tablet  Commonly known as:  NORCO/VICODIN  Take 1-2 tablets by mouth every 4 (four) hours as needed for moderate pain.     omeprazole 40 MG capsule  Commonly known as:  PRILOSEC  Take 1 capsule (40 mg total) by mouth daily.     PA BIOTIN 1000 MCG tablet  Generic drug:  Biotin  Take 1,000 mcg by mouth every morning.     pravastatin 40 MG tablet  Commonly known as:  PRAVACHOL  TAKE 1 TABLET EVERY DAY     vitamin C 500 MG tablet  Commonly known as:  ASCORBIC ACID  Take 1,000 mg by mouth every morning. 2,000 mg        Filed Vitals:   12/30/15 0024 12/30/15 0421  BP: 103/58 110/52  Pulse: 67 69  Temp: 98 F (36.7 C) 98.4 F (36.9 C)  Resp: 16 20    Skin clean, dry and intact without evidence of skin break down, no evidence of skin tears noted. IV catheter discontinued intact. Site without signs and symptoms of complications. Dressing and pressure applied. Pt denies pain at this time. No complaints noted.  An After Visit Summary was printed and given to the  patient. Patient escorted via [place], and D/C home via private auto.  Dola Argyle

## 2016-01-04 ENCOUNTER — Other Ambulatory Visit: Payer: Self-pay | Admitting: Family Medicine

## 2016-01-04 ENCOUNTER — Other Ambulatory Visit: Payer: Self-pay | Admitting: *Deleted

## 2016-01-04 DIAGNOSIS — C73 Malignant neoplasm of thyroid gland: Secondary | ICD-10-CM

## 2016-01-08 ENCOUNTER — Telehealth: Payer: Self-pay | Admitting: *Deleted

## 2016-01-08 NOTE — Telephone Encounter (Signed)
Called patient, per VO to inform her that she was presented yesterday in tumor conference and MD will discuss his plan of treatment with her when she rtc for her appointment next week.  She can also resume a regular diet.  Patient verbalized understanding.

## 2016-01-12 ENCOUNTER — Inpatient Hospital Stay: Payer: PPO | Attending: Oncology

## 2016-01-12 ENCOUNTER — Encounter: Payer: Self-pay | Admitting: Oncology

## 2016-01-12 ENCOUNTER — Inpatient Hospital Stay (HOSPITAL_BASED_OUTPATIENT_CLINIC_OR_DEPARTMENT_OTHER): Payer: PPO | Admitting: Oncology

## 2016-01-12 VITALS — BP 127/78 | HR 82 | Temp 97.0°F | Resp 18 | Wt 119.9 lb

## 2016-01-12 DIAGNOSIS — R978 Other abnormal tumor markers: Secondary | ICD-10-CM | POA: Insufficient documentation

## 2016-01-12 DIAGNOSIS — K219 Gastro-esophageal reflux disease without esophagitis: Secondary | ICD-10-CM | POA: Diagnosis not present

## 2016-01-12 DIAGNOSIS — C3431 Malignant neoplasm of lower lobe, right bronchus or lung: Secondary | ICD-10-CM

## 2016-01-12 DIAGNOSIS — I10 Essential (primary) hypertension: Secondary | ICD-10-CM | POA: Insufficient documentation

## 2016-01-12 DIAGNOSIS — Z79899 Other long term (current) drug therapy: Secondary | ICD-10-CM | POA: Diagnosis not present

## 2016-01-12 DIAGNOSIS — C73 Malignant neoplasm of thyroid gland: Secondary | ICD-10-CM | POA: Insufficient documentation

## 2016-01-12 DIAGNOSIS — E785 Hyperlipidemia, unspecified: Secondary | ICD-10-CM | POA: Diagnosis not present

## 2016-01-12 LAB — TSH: TSH: 44.561 u[IU]/mL — ABNORMAL HIGH (ref 0.350–4.500)

## 2016-01-12 MED ORDER — LEVOTHYROXINE SODIUM 50 MCG PO TABS: 50.0000 ug | ORAL_TABLET | Freq: Every day | ORAL | Status: DC

## 2016-01-16 ENCOUNTER — Encounter: Payer: Self-pay | Admitting: Oncology

## 2016-01-16 NOTE — Progress Notes (Signed)
[place] @ [facility] [phone]  [phone]  Follow-up visit  [name] OB: 10/20/1946  MR#: [id number]  GBT#:[id number]  Patient Care Team: Tonia Ghent, MD as PCP - General (Family Medicine) Beverly Gust, MD (Unknown Physician Specialty) Nestor Lewandowsky, MD as Referring Physician (Cardiothoracic Surgery)  CHIEF COMPLAINT:  Chief Complaint  Patient presents with  . Lung Cancer   1.  Abnormal CT scan with thyroid mass and a right lower lobe lung mass which is PET positive. 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  Needle biopsy of right lower lobe lung mass is positive for adenocarcinoma of lung.  T1 N0 M0 tumor diagnosis in November of 2016. 4.  Status post a right lower lobectomy.  Adenocarcinoma 1.8 cm.  Negative lymph nodes (December, 2016) 5.Status post total thyroidectomy (February, 2017) 2 different nodules both were T1 N0 M0 tumor . VISIT DIAGNOSIS:   Carcinoma of right lower lobe of lung   No history exists.      INTERVAL HISTORY:  17 never smoker underwent evaluation of abdominal pain in April 2016 with a CT abd that revealed a 4 mm nodule in her RML. In further evaluation of this finding, a CT chest was performed 08/11/15 with finding of a different nodule in superior segment of RLL adjacent to vertebrae. This was followed by PET scan demonstrating hypermetabolic activity in lung nodule as well as ipsilateral nodes and in thyroid gland. She is referred for further eval. The only symptom that she reports that might be attributable to these findings is pain in her back btw scapulae. Patient underwent EBUS and the biopsy was negative for lymph node involvement  Patient is here for ongoing evaluation and treatment consideration.  He had total thyroidectomy both nodules 1.1 cm and another one was less than 1  cm the 2 different nodule in 2 different.Marland KitchenLOBRES  Patient is here for ongoing evaluation and treatment consideration Number of questions regarding management of thyroid cancer                                                                                                                                                                                                                           REVIEW OF SYSTEMS:    PERFORMANCE STATUS (ECOG):  0 HEENT:  No visual changes, runny nose, sore throat, mouth sores or tenderness. Lungs:  No shortness of breath or cough.  No hemoptysis. Cardiac:  No chest pain, palpitations, orthopnea, or PND. GI:  No nausea, vomiting, diarrhea, constipation, melena or hematochezia. GU:  No urgency, frequency, dysuria, or hematuria. Musculoskeletal:  No back pain.  No joint pain.  No muscle tenderness. Extremities:  No pain or swelling. Skin:  No rashes or skin changes. Neuro:  No headache, numbness or weakness, balance or coordination issues. Endocrine:  No diabetes, thyroid issues, hot flashes or night sweats. Psych:  No mood changes, depression or anxiety. Pain:  No focal pain. Review of systems:  All other systems reviewed and found to be negative.  As per HPI. Otherwise, a complete review of systems is negatve.  PAST MEDICAL HISTORY: Past Medical History  Diagnosis Date  . Hyperlipidemia   . Hypertension   . GERD (gastroesophageal reflux disease)   . Groin pain     Along superior/laterl R inguinal canal.  Could be due to hernia or old scar tissue.  prev with CT done w/o alarming findings.  Will treat episodically.  Use tylenol #3 prn.    . Normal cardiac stress test 2014  . Thyroid nodule   . Cancer of lung ([place]) 10/01/2015  . PONV (postoperative nausea and vomiting)     nausea after hysterectomy, and 2 days after lung surgery 10/2015    PAST SURGICAL HISTORY: Past Surgical History  Procedure Laterality Date  . Esophagogastroduodenoscopy  2002,  11/15/07    Normal (Dr. Allyn Kenner)  . Total vaginal hysterectomy  12/30/08    for fibroid pain (Dr. Gertie Fey)  . Facial cosmetic surgery  01/13/10    mini facelift  . Breast mass excision  1977    benign  . Hernia repair  1976  . Carpal tunnel release Right 1978  . Dilation and curettage of uterus  2001  . Colonoscopy  2009  . Abdominal hysterectomy  12-30-08  . Endobronchial ultrasound N/A 08/25/2015    Procedure: ENDOBRONCHIAL ULTRASOUND;  Surgeon: Flora Lipps, MD;  Location: [facility] ORS;  Service: Cardiopulmonary;  Laterality: N/A;  . Electromagnetic navigation brochoscopy N/A 08/25/2015    Procedure: ELECTROMAGNETIC NAVIGATION BRONCHOSCOPY;  Surgeon: Flora Lipps, MD;  Location: [facility] ORS;  Service: Cardiopulmonary;  Laterality: N/A;  . Thoracotomy/lobectomy Right 10/27/2015    Procedure: THORACOTOMY/LOBECTOMY;  Surgeon: Nestor Lewandowsky, MD;  Location: [facility] ORS;  Service: Thoracic;  Laterality: Right;  . Thyroidectomy N/A 12/29/2015    Procedure: THYROIDECTOMY;  Surgeon: Beverly Gust, MD;  Location: [facility] ORS;  Service: ENT;  Laterality: N/A;    FAMILY HISTORY Family History  Problem Relation Age of Onset  . Transient ischemic attack Mother   . Hypertension Mother   . Stroke Mother     mini strokes  . Hypertension Sister   . Cancer Sister     ovarian  . Hypertension Sister   . Hypertension Sister   . Hypertension Sister   . Breast cancer Neg Hx   . Colon cancer Neg Hx        ADVANCED DIRECTIVES:  Patient does not have any living will or healthcare power of attorney.  Information was given .  Available resources had been discussed.  We will follow-up on subsequent appointments regarding this issue  HEALTH MAINTENANCE: Social History  Substance Use Topics  . Smoking status: Never Smoker   . Smokeless tobacco: Never Used  . Alcohol Use: No     Colonoscopy:  PAP:  Bone density:  Lipid panel:  Allergies  Allergen Reactions  . Atorvastatin  REACTION: myalgias  .  Ciprofloxacin     REACTION: increase reflux  . Epinephrine     Heart racing with dental injection  . Metoprolol Succinate Swelling    Swelling and stiffness in hands and ankles  . Simvastatin Other (See Comments)    Muscle aches and stiffness  . Sulfonamide Derivatives     REACTION: itching    Current Outpatient Prescriptions  Medication Sig Dispense Refill  . Ascorbic Acid (VITAMIN C) 500 MG tablet Take 1,000 mg by mouth every morning. 2,000 mg    . Biotin (PA BIOTIN) 1000 MCG tablet Take 1,000 mcg by mouth every morning.     . calcium-vitamin D (OSCAL WITH D) 500-200 MG-UNIT tablet Take 2 tablets by mouth 2 (two) times daily. 60 tablet 2  . Coenzyme Q10 (COQ10) 50 MG CAPS Take 1 capsule by mouth every evening. 200 mg    . docusate sodium (COLACE) 100 MG capsule Take 100 mg by mouth every evening. 2 capsules    . Folic Acid-Vit G8-JEH U31 (FA-VITAMIN B-6-VITAMIN B-12 PO) 1 tablet daily after breakfast.     . HYDROcodone-acetaminophen (NORCO/VICODIN) 5-325 MG tablet Take 1-2 tablets by mouth every 4 (four) hours as needed for moderate pain. 30 tablet 0  . omeprazole (PRILOSEC) 40 MG capsule Take 1 capsule (40 mg total) by mouth daily. (Patient taking differently: Take 40 mg by mouth every morning. ) 90 capsule 3  . pravastatin (PRAVACHOL) 40 MG tablet TAKE 1 TABLET EVERY DAY (Patient taking differently: TAKE 1 TABLET EVERY DAY supper) 90 tablet 3  . levothyroxine (SYNTHROID, LEVOTHROID) 50 MCG tablet Take 1 tablet (50 mcg total) by mouth daily before breakfast. 30 tablet 2  . [DISCONTINUED] calcium gluconate 500 MG tablet Take 500 mg by mouth daily.      . [DISCONTINUED] CALCIUM-MAG-VIT C-VIT D PO Take by mouth daily.      . [DISCONTINUED] metoprolol succinate (TOPROL-XL) 25 MG 24 hr tablet Take 25 mg by mouth at bedtime.      . [DISCONTINUED] simvastatin (ZOCOR) 40 MG tablet Take 40 mg by mouth at bedtime.       No current facility-administered medications for this visit.     OBJECTIVE: PHYSICAL EXAM: GENERAL:  Well developed, well nourished, sitting comfortably in the exam room in no acute distress. MENTAL STATUS:  Alert and oriented to person, place and time. HEAD: .  Normocephalic, atraumatic, face symmetric, no Cushingoid features. EYES:.  Pupils equal round and reactive to light and accomodation.  No conjunctivitis or scleral icterus. ENT:  Oropharynx clear without lesion.  Tongue normal. Mucous membranes moist.  No palpable thyroid nodule RESPIRATORY:  Clear to auscultation without rales, wheezes or rhonchi. CARDIOVASCULAR:  Regular rate and rhythm without murmur, rub or gallop. BREAST:  Right breast without masses, skin changes or nipple discharge.  Left breast without masses, skin changes or nipple discharge. ABDOMEN:  Soft, non-tender, with active bowel sounds, and no hepatosplenomegaly.  No masses. BACK:  No CVA tenderness.  No tenderness on percussion of the back or rib cage. SKIN:  No rashes, ulcers or lesions. EXTREMITIES: No edema, no skin discoloration or tenderness.  No palpable cords. LYMPH NODES: No palpable cervical, supraclavicular, axillary or inguinal adenopathy  NEUROLOGICAL: Unremarkable. PSYCH:  Appropriate.  Filed Vitals:   01/12/16 1142  BP: 127/78  Pulse: 82  Temp: 97 F (36.1 C)  Resp: 18     Body mass index is 19.96 kg/(m^2).    ECOG FS:0 - Asymptomatic  LAB RESULTS:  Appointment on 01/12/2016  Component Date Value Ref Range Status  . TSH 01/12/2016 44.561* 0.350 - 4.500 uIU/mL Final    STUDIES: No results found.  ASSESSMENT:  1.  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  ,  Patient underwent right lower lobectomy.  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  Pathology report has been reviewed 3.Patient has a parathyroidectomy   2  different nodules were found.  2 different .  T1 N0 M0 tumor  Case was discussed in tumor conference and had prolonged discussion with patient.  Considering the size of the tumor) age I do not believe patient would need to radio ACTIVE  wereI-131 ablation therapy Patient was taken off   LOW-  IODINE diet. We will be started on Synthroid 50 g T4 TSH has been reviewed.  It was not repeated in 6 weeks with just the dose of Synthroid         Patient expressed understanding and was in agreement with this plan. She also understands that She can call clinic at any time with any questions, concerns, or complaints.    No matching staging information was found for the patient.  Forest Gleason, MD   01/16/2016 12:13 PM

## 2016-01-17 LAB — THYROGLOBULIN LEVEL: Thyroglobulin: 14 ng/mL

## 2016-01-17 LAB — T4: T4, Total: 1.4 ug/dL — ABNORMAL LOW (ref 4.5–12.0)

## 2016-01-19 ENCOUNTER — Telehealth: Payer: Self-pay | Admitting: *Deleted

## 2016-01-19 NOTE — Telephone Encounter (Signed)
Pt called to confirm appt for chest xray on 3/23 at [place] pt back and left message that appointments are not scheduled for chest xray but pt only needs to go to registration desk in medical mall at anytime around 9am to get checked in for chest xray. Instructed pt to callback if has any further questions.

## 2016-01-21 ENCOUNTER — Inpatient Hospital Stay: Payer: PPO | Admitting: Cardiothoracic Surgery

## 2016-01-28 ENCOUNTER — Inpatient Hospital Stay: Payer: PPO | Admitting: Cardiothoracic Surgery

## 2016-02-04 ENCOUNTER — Inpatient Hospital Stay: Payer: PPO | Attending: Cardiothoracic Surgery | Admitting: Cardiothoracic Surgery

## 2016-02-04 ENCOUNTER — Ambulatory Visit
Admission: RE | Admit: 2016-02-04 | Discharge: 2016-02-04 | Disposition: A | Discharge status: Home / Self Care | Payer: PPO | Source: Ambulatory Visit | Attending: Cardiothoracic Surgery | Admitting: Cardiothoracic Surgery

## 2016-02-04 VITALS — BP 139/85 | HR 87 | Temp 97.6°F | Ht 65.0 in | Wt 117.5 lb

## 2016-02-04 DIAGNOSIS — E785 Hyperlipidemia, unspecified: Secondary | ICD-10-CM | POA: Insufficient documentation

## 2016-02-04 DIAGNOSIS — I10 Essential (primary) hypertension: Secondary | ICD-10-CM | POA: Diagnosis not present

## 2016-02-04 DIAGNOSIS — Z79899 Other long term (current) drug therapy: Secondary | ICD-10-CM | POA: Diagnosis not present

## 2016-02-04 DIAGNOSIS — C3431 Malignant neoplasm of lower lobe, right bronchus or lung: Secondary | ICD-10-CM | POA: Diagnosis not present

## 2016-02-04 DIAGNOSIS — Z9889 Other specified postprocedural states: Secondary | ICD-10-CM | POA: Insufficient documentation

## 2016-02-04 DIAGNOSIS — K219 Gastro-esophageal reflux disease without esophagitis: Secondary | ICD-10-CM | POA: Diagnosis not present

## 2016-02-04 DIAGNOSIS — R946 Abnormal results of thyroid function studies: Secondary | ICD-10-CM | POA: Insufficient documentation

## 2016-02-04 DIAGNOSIS — C801 Malignant (primary) neoplasm, unspecified: Secondary | ICD-10-CM | POA: Diagnosis not present

## 2016-02-04 DIAGNOSIS — J90 Pleural effusion, not elsewhere classified: Secondary | ICD-10-CM | POA: Insufficient documentation

## 2016-02-04 DIAGNOSIS — C73 Malignant neoplasm of thyroid gland: Secondary | ICD-10-CM | POA: Insufficient documentation

## 2016-02-04 DIAGNOSIS — R490 Dysphonia: Secondary | ICD-10-CM | POA: Diagnosis not present

## 2016-02-04 DIAGNOSIS — C3430 Malignant neoplasm of lower lobe, unspecified bronchus or lung: Secondary | ICD-10-CM

## 2016-02-04 NOTE — Progress Notes (Signed)
[name] Inpatient Post-Op Note  Patient ID: [name], female   DOB: 1946/09/25, 70 y.o.   MRN: [id number]  HISTORY: She is now about 5 weeks out from her thyroid surgery. She recently saw Dr. Tami Ribas who performed an endoscopy and both of her vocal cords are moving quite well and normal. She does continue to complain of some hoarseness which she thinks is actually improved slightly. Regarding her lung surgery she feels that she the pain is now almost completely gone. She takes 15 mg Norco tablet at night. She has occasional discomfort after eating which I could not explain. She states that this pain is in her incision and I did talk to her about gallbladder disease. She states that it does not occur after any particular food but can occur at any time.   Filed Vitals:   02/04/16 1405  BP: 139/85  Pulse: 87  Temp: 97.6 F (36.4 C)     EXAM: Resp: Lungs are clear bilaterally.  No respiratory distress, normal effort. Heart:  Regular without murmurs Abd:  Abdomen is soft, non distended and non tender. No masses are palpable.  There is no rebound and no guarding.  Neurological: Alert and oriented to person, place, and time. Coordination normal.  Skin: Skin is warm and dry. No rash noted. No diaphoretic. No erythema. No pallor.  Psychiatric: Normal mood and affect. Normal behavior. Judgment and thought content normal.    ASSESSMENT: Overall her chest x-ray continues to show improvement. I see no evidence of recurrence on that. Her thyroidectomy scar looks quite well. Her thoracotomy wound is also healing as expected.   PLAN:   I will see her back in July with a CT scan of the chest. This would be approximately 6 months from the date of her surgery.    Nestor Lewandowsky, MD

## 2016-02-09 ENCOUNTER — Other Ambulatory Visit: Payer: PPO

## 2016-02-09 ENCOUNTER — Inpatient Hospital Stay: Payer: PPO

## 2016-02-09 DIAGNOSIS — C3431 Malignant neoplasm of lower lobe, right bronchus or lung: Secondary | ICD-10-CM | POA: Diagnosis not present

## 2016-02-09 DIAGNOSIS — C73 Malignant neoplasm of thyroid gland: Secondary | ICD-10-CM

## 2016-02-09 LAB — COMPREHENSIVE METABOLIC PANEL
ALT: 26 U/L (ref 14–54)
AST: 24 U/L (ref 15–41)
Albumin: 4.3 g/dL (ref 3.5–5.0)
Alkaline Phosphatase: 48 U/L (ref 38–126)
Anion gap: 2 — ABNORMAL LOW (ref 5–15)
BUN: 13 mg/dL (ref 6–20)
CO2: 30 mmol/L (ref 22–32)
Calcium: 8.8 mg/dL — ABNORMAL LOW (ref 8.9–10.3)
Chloride: 105 mmol/L (ref 101–111)
Creatinine, Ser: 0.69 mg/dL (ref 0.44–1.00)
GFR calc Af Amer: >60 mL/min (ref >60)
GFR calc non Af Amer: >60 mL/min (ref >60)
Glucose, Bld: 75 mg/dL (ref 65–99)
Potassium: 3.7 mmol/L (ref 3.5–5.1)
Sodium: 137 mmol/L (ref 135–145)
Total Bilirubin: 0.9 mg/dL (ref 0.3–1.2)
Total Protein: 6.8 g/dL (ref 6.5–8.1)

## 2016-02-09 LAB — TSH: TSH: 30.396 u[IU]/mL — ABNORMAL HIGH (ref 0.350–4.500)

## 2016-02-09 LAB — CBC WITH DIFFERENTIAL/PLATELET
Basophils Absolute: 0.1 10*3/uL (ref 0–0.1)
Basophils Relative: 1 %
Eosinophils Absolute: 0.1 10*3/uL (ref 0–0.7)
Eosinophils Relative: 1 %
HCT: 42 % (ref 35.0–47.0)
Hemoglobin: 14.3 g/dL (ref 12.0–16.0)
Lymphocytes Relative: 29 %
Lymphs Abs: 1.8 10*3/uL (ref 1.0–3.6)
MCH: 30 pg (ref 26.0–34.0)
MCHC: 33.9 g/dL (ref 32.0–36.0)
MCV: 88.5 fL (ref 80.0–100.0)
Monocytes Absolute: 0.4 10*3/uL (ref 0.2–0.9)
Monocytes Relative: 7 %
Neutro Abs: 3.9 10*3/uL (ref 1.4–6.5)
Neutrophils Relative %: 62 %
Platelets: 152 10*3/uL (ref 150–440)
RBC: 4.75 MIL/uL (ref 3.80–5.20)
RDW: 15.6 % — ABNORMAL HIGH (ref 11.5–14.5)
WBC: 6.2 10*3/uL (ref 3.6–11.0)

## 2016-02-10 ENCOUNTER — Ambulatory Visit: Payer: PPO | Attending: Unknown Physician Specialty | Admitting: Speech Pathology

## 2016-02-10 DIAGNOSIS — R490 Dysphonia: Secondary | ICD-10-CM | POA: Diagnosis not present

## 2016-02-11 ENCOUNTER — Encounter: Payer: Self-pay | Admitting: Speech Pathology

## 2016-02-11 NOTE — Therapy (Signed)
[place] MAIN [facility] [address] [place], [place], [address] Phone: [phone]   Fax:  [phone]  Speech Language Pathology Evaluation  Patient Details  Name: [name] MRN: [id number] Date of Birth: 18-Jul-1946 Referring Provider: Beverly Gust, MD  Encounter Date: 02/10/2016      End of Session - 02/11/16 1258    Visit Number 1   Number of Visits 17   Date for SLP Re-Evaluation 04/08/16   SLP Start Time 1000   SLP Stop Time  18   SLP Time Calculation (min) 45 min   Activity Tolerance Patient tolerated treatment well      Past Medical History  Diagnosis Date   Hyperlipidemia    Hypertension    GERD (gastroesophageal reflux disease)    Groin pain     Along superior/laterl R inguinal canal.  Could be due to hernia or old scar tissue.  prev with CT done w/o alarming findings.  Will treat episodically.  Use tylenol #3 prn.     Normal cardiac stress test 2014   Thyroid nodule    Cancer of lung ([place]) 10/01/2015   PONV (postoperative nausea and vomiting)     nausea after hysterectomy, and 2 days after lung surgery 10/2015    Past Surgical History  Procedure Laterality Date   Esophagogastroduodenoscopy  2002, 11/15/07    Normal (Dr. Allyn Kenner)   Total vaginal hysterectomy  12/30/08    for fibroid pain (Dr. Gertie Fey)   Facial cosmetic surgery  01/13/10    mini facelift   Breast mass excision  1977    benign   Hernia repair  1976   Carpal tunnel release Right 1978   Dilation and curettage of uterus  2001   Colonoscopy  2009   Abdominal hysterectomy  12-30-08   Endobronchial ultrasound N/A 08/25/2015    Procedure: ENDOBRONCHIAL ULTRASOUND;  Surgeon: Flora Lipps, MD;  Location: [facility] ORS;  Service: Cardiopulmonary;  Laterality: N/A;   Electromagnetic navigation brochoscopy N/A 08/25/2015    Procedure: ELECTROMAGNETIC NAVIGATION BRONCHOSCOPY;  Surgeon: Flora Lipps, MD;  Location: [facility];  Service:  Cardiopulmonary;  Laterality: N/A;   Thoracotomy/lobectomy Right 10/27/2015    Procedure: THORACOTOMY/LOBECTOMY;  Surgeon: Nestor Lewandowsky, MD;  Location: [facility] OR[facility];  Service: Thoracic;  Laterality: Right;   Thyroidectomy N/A 12/29/2015    Procedure: THYROIDECTOMY;  Surgeon: Beverly Gust, MD;  Location: [facility] ORS;  Service: ENT;  Laterality: N/A;    There were no vitals filed for this visit.      Subjective Assessment - 02/11/16 1258    Subjective This 70 year old female is presenting with moderate dysphonia characterized by hoarseness, roughness, tightness, and low pitch. She notes that her ENT, Dr. Beverly Gust, stated that her vocal cords are moving well and that her voice related concerns are likely the result of trauma related to recent surgeries.   Currently in Pain? No/denies            SLP Evaluation [facility] - 02/11/16 0001    SLP Visit Information   SLP Received On 02/10/16   Referring Provider Beverly Gust, MD   Onset Date 12/29/2015   Medical Diagnosis Dysphonia   Subjective   Subjective This 70 year old female is presenting with moderate dysphonia characterized by hoarseness, roughness, tightness, and low pitch. She notes that her ENT, Dr. Beverly Gust, stated that her vocal cords are moving well and that her voice related concerns are likely the result of trauma related to recent surgeries.  Patient/Family Stated Goal Improve quality of speaking voice   Prior Functional Status   Cognitive/Linguistic Baseline Within functional limits   Oral Motor/Sensory Function   Overall Oral Motor/Sensory Function Appears within functional limits for tasks assessed   Motor Speech   Overall Motor Speech Appears within functional limits for tasks assessed   Phonation Hoarse;Low vocal intensity   Standardized Assessments   Standardized Assessments  Other Assessment  Perceptual Voice Evaluation      Perceptual Voice Evaluation Average time patient was able to sustain /s/:  13.333 Average time patient was able to sustain /z/: 8.333 s/z ratio : 1 : 1.6 Visi-Pitch: Multi-Dimensional Voice Program (MDVP) MDVP extracts objective quantitative values (Relative Average Perturbation, Shimmer, Voice Turbulence Index, and Noise to Harmonic Ratio) on sustained phonation, which are displayed graphically and numerically in comparison to a built-in normative database.  The patient exhibited values outside the norm for Relative Average Perturbation, Shimmer, Voice Turbulence Index, and Noise to Harmonic Ratio.  Average fundamental frequency was 4.12 standard deviations lower than the average for age and gender. The patient improved all parameters when cued to alter voicing (loud like me).                   SLP Education - 02/11/16 1258    Education provided Yes   Education Details the role of the SLP in treatment of dysphonia   Person(s) Educated Patient   Methods Explanation   Comprehension Verbalized understanding            SLP Long Term Goals - 02/11/16 1510    SLP LONG TERM GOAL #1   Title The patient will demonstrate independent understanding of vocal hygiene concepts and neck, shoulder, lingual stretching exercises.   Status New   SLP LONG TERM GOAL #2   Title The patient will be independent for abdominal breathing and breath support exercises.   Status New   SLP LONG TERM GOAL #3   Title The patient will maintain relaxed phonation / oral resonance for paragraph length recitation with 80% accuracy.   Status New   SLP LONG TERM GOAL #4   Title The patient will maximize voice quality and loudness using breath support for sustained vowel production, pitch glides, and hierarchical speech drill.   Status New   SLP LONG TERM GOAL #5   Title The patient will minimize vocal tension via Yawn-Sigh approach (or comparable technique) with min SLP cues with 80% accuracy.   Status New          Plan - 02/11/16 1259    Clinical Impression Statement This  70 year old female is presenting with moderate dysphonia characterized by hoarseness, roughness, tightness, and low pitch. She notes that her ENT, Dr. Beverly Gust, stated that her vocal cords are moving well and that her voice related concerns are likely the result of trauma related to recent surgeries. Ms. Dirocco had lung surgery in December of 2016 and a thyroidectomy in February of 2017. Ms. Juniel notes that her voice was "really bad" after her most recent surgery, but has gotten better over the past month and a half, specifically noting increased ability in pitch modulation and decreased hoarseness. She notes that her vocal quality is worse in the morning and gradually gets better throughout the day. Her primary concerns are related to her hoarseness and low pitch of her speaking voice, noting that she "does not sound like herself." During today's evaluation, Ms. Awwad demonstrated great promise for positive outcomes in voice  therapy through her understanding of the sources of her voice-related concerns and desire to improve her voice.   Speech Therapy Frequency 2x / week   Duration Other (comment)  8 weeks   Treatment/Interventions SLP instruction and feedback;Other (comment)  voice therapy   Potential to Achieve Goals Good   Potential Considerations Ability to learn/carryover information;Severity of impairments;Family/community support   SLP Home Exercise Plan to be determined   Consulted and Agree with Plan of Care Patient      Patient will benefit from skilled therapeutic intervention in order to improve the following deficits and impairments:   Dysphonia    Problem List Patient Active Problem List   Diagnosis Date Noted   Thyroid nodule 12/29/2015   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   Cancer of lung ([place]) 10/01/2015   Thyroid cancer ([place]) 09/11/2015   Adenopathy    Solitary pulmonary nodule 02/01/2015   Angina pectoris ([place]) 07/02/2014   Advance care  planning 05/14/2014   Chest pain 07/03/2013   Thrombocytopenia, unspecified ([place]) 07/03/2013   Thrombocytopenia ([place]) 07/03/2013   Osteopenia 05/05/2013   Medicare annual wellness visit, subsequent 04/26/2012   GERD (gastroesophageal reflux disease) 04/26/2012   Groin pain 01/12/2012   Essential hypertension 11/17/2010   HLD (hyperlipidemia) 01/31/2008   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

## 2016-02-14 LAB — THYROGLOBULIN LEVEL: Thyroglobulin: 17 ng/mL

## 2016-02-14 LAB — T4: T4, Total: 6.9 ug/dL (ref 4.5–12.0)

## 2016-02-16 ENCOUNTER — Inpatient Hospital Stay (HOSPITAL_BASED_OUTPATIENT_CLINIC_OR_DEPARTMENT_OTHER): Payer: PPO | Admitting: Oncology

## 2016-02-16 ENCOUNTER — Encounter: Payer: Self-pay | Admitting: Oncology

## 2016-02-16 ENCOUNTER — Other Ambulatory Visit: Payer: PPO

## 2016-02-16 VITALS — BP 108/70 | HR 103 | Temp 97.4°F | Resp 18 | Wt 118.2 lb

## 2016-02-16 DIAGNOSIS — Z79899 Other long term (current) drug therapy: Secondary | ICD-10-CM

## 2016-02-16 DIAGNOSIS — R946 Abnormal results of thyroid function studies: Secondary | ICD-10-CM

## 2016-02-16 DIAGNOSIS — C3431 Malignant neoplasm of lower lobe, right bronchus or lung: Secondary | ICD-10-CM

## 2016-02-16 DIAGNOSIS — C73 Malignant neoplasm of thyroid gland: Secondary | ICD-10-CM | POA: Diagnosis not present

## 2016-02-16 MED ORDER — LEVOTHYROXINE SODIUM 75 MCG PO TABS: 75.0000 ug | ORAL_TABLET | Freq: Every day | ORAL | Status: DC

## 2016-02-16 NOTE — Progress Notes (Signed)
[place] @ [facility] Telephone:([phone]) [phone]  Fax:([phone]) [phone]  Follow-up visit  Orva Riles OB: 1946/03/26  MR#: [id number]  LGX#:[id number]  Patient Care Team: Tonia Ghent, MD as PCP - General (Family Medicine) Beverly Gust, MD (Unknown Physician Specialty) Nestor Lewandowsky, MD as Referring Physician (Cardiothoracic Surgery)  CHIEF COMPLAINT:  Chief Complaint  Patient presents with  . Thyroid Cancer   1.  Abnormal CT scan with thyroid mass and a right lower lobe lung mass which is PET positive. 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  Needle biopsy of right lower lobe lung mass is positive for adenocarcinoma of lung.  T1 N0 M0 tumor diagnosis in November of 2016. 4.  Status post a right lower lobectomy.  Adenocarcinoma 1.8 cm.  Negative lymph nodes (December, 2016) 5.Status post total thyroidectomy (February, 2017) 2 different nodules both were T1 N0 M0 tumor . VISIT DIAGNOSIS:   Carcinoma of right lower lobe of lung   No history exists.      INTERVAL HISTORY:  36 never smoker underwent evaluation of abdominal pain in April 2016 with a CT abd that revealed a 4 mm nodule in her RML. In further evaluation of this finding, a CT chest was performed 08/11/15 with finding of a different nodule in superior segment of RLL adjacent to vertebrae. This was followed by PET scan demonstrating hypermetabolic activity in lung nodule as well as ipsilateral nodes and in thyroid gland. She is referred for further eval. The only symptom that she reports that might be attributable to these findings is pain in her back btw scapulae. Patient underwent EBUS and the biopsy was negative for lymph node involvement  Patient is here for ongoing evaluation and treatment consideration.  He had total thyroidectomy both nodules 1.1 cm and another one was less  than 1 cm the 2 different nodule in 2 different.Marland KitchenLOBRES  Patient is here for ongoing evaluation and treatment consideration Since last evaluation patient did not have any significant problem.  Has been set up to get a CT scan of the chest in July. TSH continues to be highest dose of Synthroid is going to be increased.  REVIEW OF SYSTEMS:    PERFORMANCE STATUS (ECOG):  0 HEENT:  No visual changes, runny nose, sore throat, mouth sores or tenderness. Lungs: No shortness of breath or cough.  No hemoptysis. Cardiac:  No chest pain, palpitations, orthopnea, or PND. GI:  No nausea, vomiting, diarrhea, constipation, melena or hematochezia. GU:  No urgency, frequency, dysuria, or hematuria. Musculoskeletal:  No back pain.  No joint pain.  No muscle tenderness. Extremities:  No pain or swelling. Skin:  No rashes or skin changes. Neuro:  No headache, numbness or weakness, balance or coordination issues. Endocrine:  No diabetes, thyroid issues, hot flashes or night sweats. Psych:  No mood changes, depression or anxiety. Pain:  No focal pain. Review of systems:  All other systems reviewed and found to be negative.  As per HPI. Otherwise, a complete review of systems is negatve.  PAST MEDICAL HISTORY: Past Medical History  Diagnosis Date  . Hyperlipidemia   . Hypertension   . GERD (gastroesophageal reflux disease)   . Groin pain     Along superior/laterl R inguinal canal.  Could be due to hernia or old scar tissue.  prev with CT done w/o alarming findings.  Will treat episodically.  Use tylenol #3 prn.    . Normal cardiac stress test 2014  . Thyroid nodule   . Cancer of lung ([place]) 10/01/2015  . PONV (postoperative nausea and vomiting)     nausea after hysterectomy, and 2 days after  lung surgery 10/2015    PAST SURGICAL HISTORY: Past Surgical History  Procedure Laterality Date  . Esophagogastroduodenoscopy  2002, 11/15/07    Normal (Dr. Allyn Kenner)  . Total vaginal hysterectomy  12/30/08    for fibroid pain (Dr. Gertie Fey)  . Facial cosmetic surgery  01/13/10    mini facelift  . Breast mass excision  1977    benign  . Hernia repair  1976  . Carpal tunnel release Right 1978  . Dilation and curettage of uterus  2001  . Colonoscopy  2009  . Abdominal hysterectomy  12-30-08  . Endobronchial ultrasound N/A 08/25/2015    Procedure: ENDOBRONCHIAL ULTRASOUND;  Surgeon: Flora Lipps, MD;  Location: [facility] ORS;  Service: Cardiopulmonary;  Laterality: N/A;  . Electromagnetic navigation brochoscopy N/A 08/25/2015    Procedure: ELECTROMAGNETIC NAVIGATION BRONCHOSCOPY;  Surgeon: Flora Lipps, MD;  Location: [facility] ORS;  Service: Cardiopulmonary;  Laterality: N/A;  . Thoracotomy/lobectomy Right 10/27/2015    Procedure: THORACOTOMY/LOBECTOMY;  Surgeon: Nestor Lewandowsky, MD;  Location: [facility] ORS;  Service: Thoracic;  Laterality: Right;  . Thyroidectomy N/A 12/29/2015    Procedure: THYROIDECTOMY;  Surgeon: Beverly Gust, MD;  Location: [facility] ORS;  Service: ENT;  Laterality: N/A;    FAMILY HISTORY Family History  Problem Relation Age of Onset  . Transient ischemic attack Mother   . Hypertension Mother   . Stroke Mother     mini strokes  . Hypertension Sister   . Cancer Sister     ovarian  . Hypertension Sister   . Hypertension Sister   . Hypertension Sister   . Breast cancer Neg Hx   . Colon cancer Neg Hx        ADVANCED DIRECTIVES:  Patient does not have any living will or healthcare power of attorney.  Information was given .  Available resources had been discussed.  We will follow-up on subsequent appointments regarding this issue  HEALTH MAINTENANCE: Social History  Substance Use Topics  . Smoking status: Never Smoker   . Smokeless tobacco: Never Used  .  Alcohol Use: No      Colonoscopy:  PAP:  Bone density:  Lipid panel:  Allergies  Allergen Reactions  . Atorvastatin     REACTION: myalgias  . Ciprofloxacin     REACTION: increase reflux  . Epinephrine     Heart racing with dental injection  . Metoprolol Succinate Swelling    Swelling and stiffness in hands and ankles  . Simvastatin Other (See Comments)    Muscle aches and stiffness  . Sulfonamide Derivatives     REACTION: itching    Current Outpatient Prescriptions  Medication Sig Dispense Refill  . Ascorbic Acid (VITAMIN C) 500 MG tablet Take 1,000 mg by mouth every morning. Reported on 02/04/2016    . Biotin (PA BIOTIN) 1000 MCG tablet Take 1,000 mcg by mouth every morning. Reported on 02/04/2016    . calcium-vitamin D (OSCAL WITH D) 500-200 MG-UNIT tablet Take 2 tablets by mouth 2 (two) times daily. 60 tablet 2  . Coenzyme Q10 (COQ10) 50 MG CAPS Take 1 capsule by mouth every evening. Reported on 02/04/2016    . docusate sodium (COLACE) 100 MG capsule Take 100 mg by mouth every evening. Reported on 02/04/2016    . Folic Acid-Vit U3-JSH F02 (FA-VITAMIN B-6-VITAMIN B-12 PO) 1 tablet daily after breakfast. Reported on 02/04/2016    . HYDROcodone-acetaminophen (NORCO/VICODIN) 5-325 MG tablet Take 1-2 tablets by mouth every 4 (four) hours as needed for moderate pain. 30 tablet 0  . levothyroxine (SYNTHROID, LEVOTHROID) 50 MCG tablet Take 1 tablet (50 mcg total) by mouth daily before breakfast. 30 tablet 2  . pravastatin (PRAVACHOL) 40 MG tablet TAKE 1 TABLET EVERY DAY (Patient taking differently: TAKE 1 TABLET EVERY DAY supper) 90 tablet 3  . [DISCONTINUED] calcium gluconate 500 MG tablet Take 500 mg by mouth daily.      . [DISCONTINUED] CALCIUM-MAG-VIT C-VIT D PO Take by mouth daily.      . [DISCONTINUED] metoprolol succinate (TOPROL-XL) 25 MG 24 hr tablet Take 25 mg by mouth at bedtime.      . [DISCONTINUED] simvastatin (ZOCOR) 40 MG tablet Take 40 mg by mouth at bedtime.       No current  facility-administered medications for this visit.    OBJECTIVE: PHYSICAL EXAM: GENERAL:  Well developed, well nourished, sitting comfortably in the exam room in no acute distress. MENTAL STATUS:  Alert and oriented to person, place and time. HEAD: .  Normocephalic, atraumatic, face symmetric, no Cushingoid features. EYES:.  Pupils equal round and reactive to light and accomodation.  No conjunctivitis or scleral icterus. ENT:  Oropharynx clear without lesion.  Tongue normal. Mucous membranes moist.  No palpable thyroid nodule RESPIRATORY:  Clear to auscultation without rales, wheezes or rhonchi. CARDIOVASCULAR:  Regular rate and rhythm without murmur, rub or gallop. BREAST:  Right breast without masses, skin changes or nipple discharge.  Left breast without masses, skin changes or nipple discharge. ABDOMEN:  Soft, non-tender, with active bowel sounds, and no hepatosplenomegaly.  No masses. BACK:  No CVA tenderness.  No tenderness on percussion of the back or rib cage. SKIN:  No rashes, ulcers or lesions. EXTREMITIES: No edema, no skin discoloration or tenderness.  No palpable cords. LYMPH NODES: No palpable cervical, supraclavicular, axillary or inguinal adenopathy  NEUROLOGICAL: Unremarkable. PSYCH:  Appropriate.  Filed Vitals:   02/16/16 1103  BP: 108/70  Pulse: 103  Temp: 97.4 F (36.3 C)  Resp: 18     Body mass index is 19.66 kg/(m^2).    ECOG FS:0 -  Asymptomatic  LAB RESULTS:  No visits with results within 5 Day(s) from this visit. Latest known visit with results is:  Appointment on 02/09/2016  Component Date Value Ref Range Status  . WBC 02/09/2016 6.2  3.6 - 11.0 K/uL Final  . RBC 02/09/2016 4.75  3.80 - 5.20 MIL/uL Final  . Hemoglobin 02/09/2016 14.3  12.0 - 16.0 g/dL Final  . HCT 02/09/2016 42.0  35.0 - 47.0 % Final  . MCV 02/09/2016 88.5  80.0 - 100.0 fL Final  . MCH 02/09/2016 30.0  26.0 - 34.0 pg Final  . MCHC 02/09/2016 33.9  32.0 - 36.0 g/dL Final  . RDW  02/09/2016 15.6* 11.5 - 14.5 % Final  . Platelets 02/09/2016 152  150 - 440 K/uL Final  . Neutrophils Relative % 02/09/2016 62   Final  . Neutro Abs 02/09/2016 3.9  1.4 - 6.5 K/uL Final  . Lymphocytes Relative 02/09/2016 29   Final  . Lymphs Abs 02/09/2016 1.8  1.0 - 3.6 K/uL Final  . Monocytes Relative 02/09/2016 7   Final  . Monocytes Absolute 02/09/2016 0.4  0.2 - 0.9 K/uL Final  . Eosinophils Relative 02/09/2016 1   Final  . Eosinophils Absolute 02/09/2016 0.1  0 - 0.7 K/uL Final  . Basophils Relative 02/09/2016 1   Final  . Basophils Absolute 02/09/2016 0.1  0 - 0.1 K/uL Final  . Sodium 02/09/2016 137  135 - 145 mmol/L Final  . Potassium 02/09/2016 3.7  3.5 - 5.1 mmol/L Final  . Chloride 02/09/2016 105  101 - 111 mmol/L Final  . CO2 02/09/2016 30  22 - 32 mmol/L Final  . Glucose, Bld 02/09/2016 75  65 - 99 mg/dL Final  . BUN 02/09/2016 13  6 - 20 mg/dL Final  . Creatinine, Ser 02/09/2016 0.69  0.44 - 1.00 mg/dL Final  . Calcium 02/09/2016 8.8* 8.9 - 10.3 mg/dL Final  . Total Protein 02/09/2016 6.8  6.5 - 8.1 g/dL Final  . Albumin 02/09/2016 4.3  3.5 - 5.0 g/dL Final  . AST 02/09/2016 24  15 - 41 U/L Final  . ALT 02/09/2016 26  14 - 54 U/L Final  . Alkaline Phosphatase 02/09/2016 48  38 - 126 U/L Final  . Total Bilirubin 02/09/2016 0.9  0.3 - 1.2 mg/dL Final  . GFR calc non Af Amer 02/09/2016 >60  >60 mL/min Final  . GFR calc Af Amer 02/09/2016 >60  >60 mL/min Final   Comment: (NOTE) The eGFR has been calculated using the CKD EPI equation. This calculation has not been validated in all clinical situations. eGFR's persistently <60 mL/min signify possible Chronic Kidney Disease.   . Anion gap 02/09/2016 2* 5 - 15 Final   RESULT REPEATED AND VERIFIED  . T4, Total 02/09/2016 6.9  4.5 - 12.0 ug/dL Final   Comment: (NOTE) Performed At: [facility] [place], [place] [id number] Lindon Romp MD [id number]   . TSH 02/09/2016 30.396* 0.350 - 4.500  uIU/mL Final  . Thyroglobulin 02/09/2016 17   Final   Comment: (NOTE) Reference Range: Pubertal Children and Adults: <40 According to the [facility] of Clinical Biochemistry, the reference interval for Thyroglobulin (TG) should be related to euthyroid patients and not for patients who underwent thyroidectomy.  TG reference intervals for these patients depend on the residual mass of the thyroid tissue left after surgery.  Establishing a post-operative baseline is recommended.  The assay quantitation limit is 2.0 ng/mL. Performed At: [facility] [phone]  [place], [place] [id number] Pepkowitz Sheral Apley MD YV:[id number]     STUDIES: Dg Chest 2 View  02/04/2016  CLINICAL DATA:  Status post right lower lobe removal for neoplasm. Postoperative evaluation. EXAM: CHEST  2 VIEW COMPARISON:  November 29, 2005 chest CT and December 09, 2005 chest radiograph FINDINGS: There is postoperative volume loss on the right with moderate right effusion. No edema or consolidation. Heart size and pulmonary vascularity are normal. No adenopathy. No bone lesions. IMPRESSION: Postoperative volume loss on the right with right effusion. No edema or consolidation. No new opacity. No change in cardiac silhouette. Electronically Signed   By: Lowella Grip III M.D.   On: 02/04/2016 16:27    ASSESSMENT:  1.  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  ,  Patient underwent right lower lobectomy.  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  Pathology report has been reviewed 3.Patient has a thyroidectomy   2 different nodules were found.  2 different .  T1 N0 M0 tumor  No further therapy for thyroid cancer was recommended  3.  TSH continues to be high We will increase the dose of Synthroid to 75 g and  recheck T4 TSH in 6 weeks. Patient will be evaluated by Dr. B index 3 months for evaluation regarding lung cancer as well as thyroid cancer         Patient expressed understanding and was in agreement with this plan. She also understands that She can call clinic at any time with any questions, concerns, or complaints.    No matching staging information was found for the patient.  Forest Gleason, MD   02/16/2016 11:51 AM

## 2016-02-16 NOTE — Progress Notes (Signed)
Patient continues to have pain between her shoulder blades at night.

## 2016-02-19 ENCOUNTER — Ambulatory Visit: Payer: PPO | Admitting: Speech Pathology

## 2016-02-22 ENCOUNTER — Encounter: Payer: Self-pay | Admitting: Oncology

## 2016-02-23 ENCOUNTER — Ambulatory Visit: Payer: PPO | Admitting: Oncology

## 2016-02-24 ENCOUNTER — Ambulatory Visit: Payer: PPO | Admitting: Speech Pathology

## 2016-02-29 ENCOUNTER — Ambulatory Visit: Payer: PPO | Admitting: Speech Pathology

## 2016-03-01 DIAGNOSIS — H40023 Open angle with borderline findings, high risk, bilateral: Secondary | ICD-10-CM | POA: Diagnosis not present

## 2016-03-03 ENCOUNTER — Ambulatory Visit: Payer: PPO | Admitting: Speech Pathology

## 2016-03-07 ENCOUNTER — Other Ambulatory Visit: Payer: Self-pay

## 2016-03-07 ENCOUNTER — Ambulatory Visit: Payer: PPO | Admitting: Speech Pathology

## 2016-03-07 MED ORDER — PRAVASTATIN SODIUM 40 MG PO TABS: 40.0000 mg | ORAL_TABLET | Freq: Every day | ORAL | Status: DC

## 2016-03-07 NOTE — Telephone Encounter (Signed)
Pt left v/m; pt has changed ins coverage and request pravastatin sent to [facility]. Pt last seen annual exam on 05/21/15; pt has CPX scheduled on 05/24/2016.refill x 1 per protocol and advised pt that refilled x 1 and f/u for further refills at [facility]. Pt voiced understanding.

## 2016-03-09 ENCOUNTER — Ambulatory Visit: Payer: PPO | Admitting: Speech Pathology

## 2016-03-29 ENCOUNTER — Inpatient Hospital Stay: Payer: PPO | Attending: Internal Medicine

## 2016-03-29 DIAGNOSIS — C73 Malignant neoplasm of thyroid gland: Secondary | ICD-10-CM | POA: Insufficient documentation

## 2016-03-29 DIAGNOSIS — R946 Abnormal results of thyroid function studies: Secondary | ICD-10-CM | POA: Insufficient documentation

## 2016-03-29 DIAGNOSIS — C3431 Malignant neoplasm of lower lobe, right bronchus or lung: Secondary | ICD-10-CM | POA: Diagnosis not present

## 2016-03-29 DIAGNOSIS — Z79899 Other long term (current) drug therapy: Secondary | ICD-10-CM | POA: Diagnosis not present

## 2016-03-29 LAB — TSH: TSH: 9.793 u[IU]/mL — ABNORMAL HIGH (ref 0.350–4.500)

## 2016-03-30 ENCOUNTER — Telehealth: Payer: Self-pay | Admitting: *Deleted

## 2016-03-30 ENCOUNTER — Other Ambulatory Visit: Payer: Self-pay | Admitting: *Deleted

## 2016-03-30 DIAGNOSIS — C73 Malignant neoplasm of thyroid gland: Secondary | ICD-10-CM

## 2016-03-30 MED ORDER — LEVOTHYROXINE SODIUM 100 MCG PO TABS: 100.0000 ug | ORAL_TABLET | Freq: Every day | ORAL | Status: DC

## 2016-03-30 NOTE — Telephone Encounter (Signed)
Called patient and left message that TSH continues to be high.  He is increasing synthroid to 100 mcg daily.  A new prescription has been called to [facility].

## 2016-03-30 NOTE — Telephone Encounter (Signed)
TSH remains high. Per Dr. Oliva Bustard, pt needs to increased synthroid to 121mg. New prescription has been sent to [facility].

## 2016-04-03 LAB — THYROGLOBULIN LEVEL: Thyroglobulin: 15 ng/mL

## 2016-04-03 LAB — T4: T4, Total: 8 ug/dL (ref 4.5–12.0)

## 2016-05-02 DIAGNOSIS — M2041 Other hammer toe(s) (acquired), right foot: Secondary | ICD-10-CM | POA: Diagnosis not present

## 2016-05-02 DIAGNOSIS — M2011 Hallux valgus (acquired), right foot: Secondary | ICD-10-CM | POA: Diagnosis not present

## 2016-05-04 ENCOUNTER — Ambulatory Visit
Admission: RE | Admit: 2016-05-04 | Discharge: 2016-05-04 | Disposition: A | Discharge status: Home / Self Care | Payer: PPO | Source: Ambulatory Visit | Attending: Cardiothoracic Surgery | Admitting: Cardiothoracic Surgery

## 2016-05-04 DIAGNOSIS — C801 Malignant (primary) neoplasm, unspecified: Secondary | ICD-10-CM | POA: Insufficient documentation

## 2016-05-04 DIAGNOSIS — I700 Atherosclerosis of aorta: Secondary | ICD-10-CM | POA: Insufficient documentation

## 2016-05-04 DIAGNOSIS — Z9889 Other specified postprocedural states: Secondary | ICD-10-CM | POA: Diagnosis not present

## 2016-05-04 DIAGNOSIS — C3490 Malignant neoplasm of unspecified part of unspecified bronchus or lung: Secondary | ICD-10-CM | POA: Diagnosis not present

## 2016-05-05 ENCOUNTER — Ambulatory Visit: Payer: PPO | Admitting: Cardiothoracic Surgery

## 2016-05-06 ENCOUNTER — Encounter: Payer: Self-pay | Admitting: Internal Medicine

## 2016-05-06 DIAGNOSIS — C3431 Malignant neoplasm of lower lobe, right bronchus or lung: Secondary | ICD-10-CM | POA: Insufficient documentation

## 2016-05-09 ENCOUNTER — Telehealth: Payer: Self-pay

## 2016-05-09 DIAGNOSIS — M8580 Other specified disorders of bone density and structure, unspecified site: Secondary | ICD-10-CM

## 2016-05-09 DIAGNOSIS — Z1239 Encounter for other screening for malignant neoplasm of breast: Secondary | ICD-10-CM

## 2016-05-09 NOTE — Telephone Encounter (Signed)
She doesn't have to do both together.   If she wants to talk about DXA at the [facility] before possibly repeating the scan, then that is okay with me.  It would be okay to proceed with both mammogram and DXA but it isn't mandatory.   Let me know what she wants to do and I'll put in the order as needed.  Thanks.

## 2016-05-09 NOTE — Telephone Encounter (Signed)
Pt left v/m; pt scheduled for CPX on 05/24/16; last Dexa was 05/02/13 and last screening mammogram 05/28/15. Pt wants to know if needs to schedule mammogram and dexa at same time; per health maintenance tab pt needs next screen mammogram 05/28/15 and last dexa was 05/02/13. Pt request cb.

## 2016-05-09 NOTE — Telephone Encounter (Signed)
Please send in order for both to be done at the [facility] after the end of July.

## 2016-05-10 ENCOUNTER — Ambulatory Visit: Payer: PPO | Admitting: Cardiothoracic Surgery

## 2016-05-10 ENCOUNTER — Ambulatory Visit (INDEPENDENT_AMBULATORY_CARE_PROVIDER_SITE_OTHER): Payer: PPO | Admitting: Surgery

## 2016-05-10 ENCOUNTER — Encounter: Payer: Self-pay | Admitting: Surgery

## 2016-05-10 ENCOUNTER — Ambulatory Visit (INDEPENDENT_AMBULATORY_CARE_PROVIDER_SITE_OTHER): Payer: PPO | Admitting: Cardiothoracic Surgery

## 2016-05-10 ENCOUNTER — Encounter: Payer: Self-pay | Admitting: Cardiothoracic Surgery

## 2016-05-10 VITALS — BP 137/86 | HR 96 | Temp 98.6°F | Ht 65.0 in | Wt 117.0 lb

## 2016-05-10 VITALS — BP 131/74 | HR 91 | Temp 97.5°F | Ht 65.0 in | Wt 117.0 lb

## 2016-05-10 DIAGNOSIS — L723 Sebaceous cyst: Secondary | ICD-10-CM | POA: Diagnosis not present

## 2016-05-10 DIAGNOSIS — C3431 Malignant neoplasm of lower lobe, right bronchus or lung: Secondary | ICD-10-CM | POA: Diagnosis not present

## 2016-05-10 NOTE — Telephone Encounter (Signed)
Orders placed. Thanks.

## 2016-05-10 NOTE — Patient Instructions (Signed)
You have been scheduled for removal of your Lipoma on 06/03/16 at 1330 in the [facility] with Dr. Azalee Course.  Please call with any questions or concerns prior to this appointment.  Lipoma A lipoma is a noncancerous (benign) tumor that is made up of fat cells. This is a very common type of soft-tissue growth. Lipomas are usually found under the skin (subcutaneous). They may occur in any tissue of the body that contains fat. Common areas for lipomas to appear include the back, shoulders, buttocks, and thighs. Lipomas grow slowly, and they are usually painless. Most lipomas do not cause problems and do not require treatment. CAUSES The cause of this condition is not known. RISK FACTORS This condition is more likely to develop in:  People who are 72-72 years old.  People who have a family history of lipomas. SYMPTOMS A lipoma usually appears as a small, round bump under the skin. It may feel soft or rubbery, but the firmness can vary. Most lipomas are not painful. However, a lipoma may become painful if it is located in an area where it pushes on nerves. DIAGNOSIS A lipoma can usually be diagnosed with a physical exam. You may also have tests to confirm the diagnosis and to rule out other conditions. Tests may include:  Imaging tests, such as a CT scan or MRI.  Removal of a tissue sample to be looked at under a microscope (biopsy). TREATMENT Treatment is not needed for small lipomas that are not causing problems. If a lipoma continues to get bigger or it causes problems, removal is often the best option. Lipomas can also be removed to improve appearance. Removal of a lipoma is usually done with a surgery in which the fatty cells and the surrounding capsule are removed. Most often, a medicine that numbs the area (local anesthetic) is used for this procedure. HOME CARE INSTRUCTIONS  Keep all follow-up visits as directed by your health care provider. This is important. SEEK MEDICAL CARE  IF:  Your lipoma becomes larger or hard.  Your lipoma becomes painful, red, or increasingly swollen. These could be signs of infection or a more serious condition.   This information is not intended to replace advice given to you by your health care provider. Make sure you discuss any questions you have with your health care provider.   Document Released: 10/07/2002 Document Revised: 03/03/2015 Document Reviewed: 10/13/2014 Elsevier Interactive Patient Education [employer].

## 2016-05-10 NOTE — Patient Instructions (Signed)
Please give us a call if you have any questions or concerns. 

## 2016-05-10 NOTE — Progress Notes (Signed)
Subjective:     Patient ID: [name], female   DOB: 1946/09/23, 70 y.o.   MRN: [id number]  HPI  70 yr old female Well-known to [facility] surgical [facility] due to her recent history of lung cancer with the thoracotomy with Dr. Faith Rogue back in December and a thyroidectomy for thyroid cancer in February with Dr. Tami Ribas. Patient was being seen in her follow-up appointment with Dr. Faith Rogue today and she did note a left thigh mass that she states been there for about 10 years and seems to be getting larger. The patient states that she's noted that it been there for a long time and because a little bit of soreness she feels as if the area has been getting larger over the past few months. Patient states that it's not necessarily painful but is a little bit uncomfortable when she sits directly on it does cause some pain and pressure. The patient denies ever having any drainage or redness but does fill as if she could see sort of punctate type head on the area. Patient has never had any other areas like this before. She denies any fever chills shortness of breath chest pain abdominal pain or drainage Past Medical History  Diagnosis Date  . Hyperlipidemia   . Hypertension   . GERD (gastroesophageal reflux disease)   . Groin pain     Along superior/laterl R inguinal canal.  Could be due to hernia or old scar tissue.  prev with CT done w/o alarming findings.  Will treat episodically.  Use tylenol #3 prn.    . Normal cardiac stress test 2014  . Thyroid nodule   . Cancer of lung ([place]) 10/01/2015  . PONV (postoperative nausea and vomiting)     nausea after hysterectomy, and 2 days after lung surgery 10/2015   Past Surgical History  Procedure Laterality Date  . Esophagogastroduodenoscopy  2002, 11/15/07    Normal (Dr. Allyn Kenner)  . Total vaginal hysterectomy  12/30/08    for fibroid pain (Dr. Gertie Fey)  . Facial cosmetic surgery  01/13/10    mini facelift  . Breast mass excision  1977    benign  . Hernia  repair  1976  . Carpal tunnel release Right 1978  . Dilation and curettage of uterus  2001  . Colonoscopy  2009  . Abdominal hysterectomy  12-30-08  . Endobronchial ultrasound N/A 08/25/2015    Procedure: ENDOBRONCHIAL ULTRASOUND;  Surgeon: Flora Lipps, MD;  Location: [facility] ORS;  Service: Cardiopulmonary;  Laterality: N/A;  . Electromagnetic navigation brochoscopy N/A 08/25/2015    Procedure: ELECTROMAGNETIC NAVIGATION BRONCHOSCOPY;  Surgeon: Flora Lipps, MD;  Location: [facility] ORS;  Service: Cardiopulmonary;  Laterality: N/A;  . Thoracotomy/lobectomy Right 10/27/2015    Procedure: THORACOTOMY/LOBECTOMY;  Surgeon: Nestor Lewandowsky, MD;  Location: [facility] ORS;  Service: Thoracic;  Laterality: Right;  . Thyroidectomy N/A 12/29/2015    Procedure: THYROIDECTOMY;  Surgeon: Beverly Gust, MD;  Location: [facility] ORS;  Service: ENT;  Laterality: N/A;   Family History  Problem Relation Age of Onset  . Transient ischemic attack Mother   . Hypertension Mother   . Stroke Mother     mini strokes  . Hypertension Sister   . Cancer Sister     ovarian  . Hypertension Sister   . Hypertension Sister   . Hypertension Sister   . Breast cancer Neg Hx   . Colon cancer Neg Hx    Social History   Social History  . Marital Status: Married    Spouse Name:  N/A  . Number of Children: N/A  . Years of Education: N/A   Occupational History  . 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 Smoking status: Never Smoker   . Smokeless tobacco: Never Used  . Alcohol Use: No  . Drug Use: No  . Sexual Activity: No   Other Topics Concern  . None   Social History Narrative   Married 1963 and lives with husband (he had a CVA 11/12, to SNF and then home as of 12/2011)   Retired from medical office 2013.    Current outpatient prescriptions:  .  Ascorbic Acid (VITAMIN C) 500 MG tablet, Take 1,000 mg by mouth every morning. Reported on 02/04/2016, Disp: , Rfl:  .  Biotin (PA BIOTIN) 1000 MCG tablet, Take 1,000  mcg by mouth every morning. Reported on 02/04/2016, Disp: , Rfl:  .  Coenzyme Q10 (COQ10) 50 MG CAPS, Take 1 capsule by mouth every evening. Reported on 02/04/2016, Disp: , Rfl:  .  Folic Acid-Vit N2-DPO E42 (FA-VITAMIN B-6-VITAMIN B-12 PO), 1 tablet daily after breakfast. Reported on 02/04/2016, Disp: , Rfl:  .  levothyroxine (SYNTHROID) 100 MCG tablet, Take 1 tablet (100 mcg total) by mouth daily before breakfast., Disp: 30 tablet, Rfl: 3 .  omeprazole (PRILOSEC) 40 MG capsule, Take 40 mg by mouth 2 (two) times daily., Disp: , Rfl: 3 .  pravastatin (PRAVACHOL) 40 MG tablet, Take 1 tablet (40 mg total) by mouth daily., Disp: 90 tablet, Rfl: 0 .  [DISCONTINUED] calcium gluconate 500 MG tablet, Take 500 mg by mouth daily.  , Disp: , Rfl:  .  [DISCONTINUED] CALCIUM-MAG-VIT C-VIT D PO, Take by mouth daily.  , Disp: , Rfl:  .  [DISCONTINUED] metoprolol succinate (TOPROL-XL) 25 MG 24 hr tablet, Take 25 mg by mouth at bedtime.  , Disp: , Rfl:  .  [DISCONTINUED] simvastatin (ZOCOR) 40 MG tablet, Take 40 mg by mouth at bedtime.  , Disp: , Rfl:  Allergies  Allergen Reactions  . Atorvastatin     REACTION: myalgias  . Ciprofloxacin     REACTION: increase reflux  . Epinephrine     Heart racing with dental injection  . Metoprolol Succinate Swelling    Swelling and stiffness in hands and ankles  . Simvastatin Other (See Comments)    Muscle aches and stiffness  . Sulfonamide Derivatives     REACTION: itching      Review of Systems  Constitutional: Negative for fever, activity change and appetite change.  HENT: Negative for congestion and sore throat.   Respiratory: Negative for cough, shortness of breath, wheezing and stridor.   Cardiovascular: Negative for chest pain, palpitations and leg swelling.  Gastrointestinal: Negative for nausea, abdominal pain, diarrhea and abdominal distention.  Genitourinary: Negative for dysuria and hematuria.  Musculoskeletal: Negative for back pain and neck pain.  Skin:  Negative for color change, pallor, rash and wound.  Neurological: Negative for dizziness, seizures and weakness.  Hematological: Negative for adenopathy. Does not bruise/bleed easily.  Psychiatric/Behavioral: Negative for agitation. The patient is not nervous/anxious.   All other systems reviewed and are negative.      Filed Vitals:   05/10/16 1515  BP: 137/86  Pulse: 96  Temp: 98.6 F (37 C)    Objective:   Physical Exam  Constitutional: She is oriented to person, place, and time. She appears well-developed and well-nourished. No distress.  HENT:  Head: Normocephalic and atraumatic.  Right Ear: External ear normal.  Left  Ear: External ear normal.  Nose: Nose normal.  Mouth/Throat: Oropharynx is clear and moist. No oropharyngeal exudate.  Eyes: Conjunctivae and EOM are normal. Pupils are equal, round, and reactive to light. No scleral icterus.  Neck: Normal range of motion. Neck supple. No tracheal deviation present.  Well healed thyroidectomy scar  Cardiovascular: Normal rate, regular rhythm, normal heart sounds and intact distal pulses.  Exam reveals no gallop and no friction rub.   No murmur heard. Pulmonary/Chest: Effort normal and breath sounds normal. No respiratory distress. She has no wheezes. She has no rales.  Well healed right thoractomy incision and two lower chest tube incisions well healed  Abdominal: Soft. Bowel sounds are normal. She exhibits no distension and no mass. There is no tenderness.  Musculoskeletal: Normal range of motion. She exhibits no edema or tenderness.  Neurological: She is alert and oriented to person, place, and time. No cranial nerve deficit.  Skin: Skin is warm and dry. No erythema.  Left thigh 3cm area of round, mobile rubbery area, with some thinning of the skin that could be a cyst head, no induration or drainage, no erythema   Psychiatric: She has a normal mood and affect. Her behavior is normal. Judgment and thought content normal.   Vitals reviewed.      Assessment:     70 yr old with sebaceous cyst to left thigh    Plan:     Patient as well as his surgery service are personally reviewed her past history cleared her recent thyroid cancer and lung cancer as well as a history of some hypertension she is now off of her lisinopril and her blood pressures are maintaining well.  Also reviewed her laboratory values which are within normal limits except for the TSH which is slowly coming down. I personally reviewed her PET scan images which do show this 2-3 cm cystic mass that is not hypermetabolic although it is not mentioned on her radiology read.  I discussed with the patient that is likely either a epidermal inclusion cyst or sebaceous cyst in this area and that it could easily be taken out in an office procedure. I discussed with her the risk benefits alternatives and expected outcomes to include bleeding, infection, recurrence, need for further procedures and damage to structures in the area which would be mainly subcutaneous tissue. The patient was given opportunity to ask questions and have them answered. For the patient's scheduling purposes she would rather have this done in August and so set up to have this done next time I'm in [place] clinic the first week of August.

## 2016-05-10 NOTE — Progress Notes (Signed)
[name] Inpatient Post-Op Note  Patient ID: [name], female   DOB: July 30, 1946, 70 y.o.   MRN: [id number]  HISTORY: This patient returns today in follow-up. She is now about 6 months out from her right thoracotomy and right lower lobectomy. She is also status post thyroidectomy for her thyroid malignancy. She is a patient of Dr. Oliva Bustard but she is scheduled to see Dr. Ephraim Hamburger next week. She did have a CT scan the chest at her 6 month follow-up. I did independently review that. Her only other complaint today she has an occasional cough which is improved from her preoperative state. She also has some paresthesias along her thoracotomy incision. She also has a cyst which has appeared on her left hip and is likely a sebaceous cyst. She states that this occasionally causes some discomfort when she lies on it. She would like to have this removed.   Filed Vitals:   05/10/16 0912  BP: 131/74  Pulse: 91  Temp: 97.5 F (36.4 C)     EXAM: Resp: Lungs are clear bilaterally.  No respiratory distress, normal effort. Heart:  Regular without murmurs Her thoracotomy wound is well healed. Her thyroidectomy scar is also well healed. There are no nodules or erythema  Neurological: Alert and oriented to person, place, and time. Coordination normal.  Skin: Skin is warm and dry. No rash noted. No diaphoretic. No erythema. No pallor.  Psychiatric: Normal mood and affect. Normal behavior. Judgment and thought content normal.    ASSESSMENT: I have independently reviewed the patient's CT scan. I do not see any evidence of recurrence on it. She is scheduled to follow-up with oncology later this month and I would recommend her continued participation in their surveillance program.   PLAN:   I did not make a return visit for her today but I would be happy to see her should the need arise.    Nestor Lewandowsky, MD

## 2016-05-12 ENCOUNTER — Ambulatory Visit: Payer: Commercial Managed Care - HMO | Admitting: Podiatry

## 2016-05-12 NOTE — Telephone Encounter (Signed)
Called patient and she will call [facility] herself and schedule her own MMG and Bone Density.

## 2016-05-15 ENCOUNTER — Other Ambulatory Visit: Payer: Self-pay | Admitting: Family Medicine

## 2016-05-15 DIAGNOSIS — E785 Hyperlipidemia, unspecified: Secondary | ICD-10-CM

## 2016-05-15 DIAGNOSIS — C73 Malignant neoplasm of thyroid gland: Secondary | ICD-10-CM

## 2016-05-15 DIAGNOSIS — M8580 Other specified disorders of bone density and structure, unspecified site: Secondary | ICD-10-CM

## 2016-05-17 ENCOUNTER — Other Ambulatory Visit: Payer: Self-pay | Admitting: *Deleted

## 2016-05-17 DIAGNOSIS — C73 Malignant neoplasm of thyroid gland: Secondary | ICD-10-CM

## 2016-05-18 ENCOUNTER — Inpatient Hospital Stay (HOSPITAL_BASED_OUTPATIENT_CLINIC_OR_DEPARTMENT_OTHER): Payer: PPO

## 2016-05-18 ENCOUNTER — Inpatient Hospital Stay: Payer: PPO | Attending: Internal Medicine | Admitting: Internal Medicine

## 2016-05-18 VITALS — BP 116/73 | HR 81 | Temp 97.8°F | Resp 18 | Wt 117.2 lb

## 2016-05-18 DIAGNOSIS — C3431 Malignant neoplasm of lower lobe, right bronchus or lung: Secondary | ICD-10-CM

## 2016-05-18 DIAGNOSIS — C73 Malignant neoplasm of thyroid gland: Secondary | ICD-10-CM | POA: Diagnosis not present

## 2016-05-18 DIAGNOSIS — I10 Essential (primary) hypertension: Secondary | ICD-10-CM | POA: Diagnosis not present

## 2016-05-18 DIAGNOSIS — K219 Gastro-esophageal reflux disease without esophagitis: Secondary | ICD-10-CM | POA: Diagnosis not present

## 2016-05-18 DIAGNOSIS — Z85118 Personal history of other malignant neoplasm of bronchus and lung: Secondary | ICD-10-CM

## 2016-05-18 DIAGNOSIS — Z79899 Other long term (current) drug therapy: Secondary | ICD-10-CM | POA: Diagnosis not present

## 2016-05-18 DIAGNOSIS — E785 Hyperlipidemia, unspecified: Secondary | ICD-10-CM

## 2016-05-18 LAB — COMPREHENSIVE METABOLIC PANEL
ALT: 21 U/L (ref 14–54)
AST: 21 U/L (ref 15–41)
Albumin: 3.8 g/dL (ref 3.5–5.0)
Alkaline Phosphatase: 42 U/L (ref 38–126)
Anion gap: 5 (ref 5–15)
BUN: 15 mg/dL (ref 6–20)
CO2: 30 mmol/L (ref 22–32)
Calcium: 8.5 mg/dL — ABNORMAL LOW (ref 8.9–10.3)
Chloride: 104 mmol/L (ref 101–111)
Creatinine, Ser: 0.65 mg/dL (ref 0.44–1.00)
GFR calc Af Amer: >60 mL/min (ref >60)
GFR calc non Af Amer: >60 mL/min (ref >60)
Glucose, Bld: 93 mg/dL (ref 65–99)
Potassium: 3.7 mmol/L (ref 3.5–5.1)
Sodium: 139 mmol/L (ref 135–145)
Total Bilirubin: 0.8 mg/dL (ref 0.3–1.2)
Total Protein: 6.3 g/dL — ABNORMAL LOW (ref 6.5–8.1)

## 2016-05-18 LAB — CBC WITH DIFFERENTIAL/PLATELET
Basophils Absolute: 0 10*3/uL (ref 0–0.1)
Basophils Relative: 1 %
Eosinophils Absolute: 0.1 10*3/uL (ref 0–0.7)
Eosinophils Relative: 2 %
HCT: 38.7 % (ref 35.0–47.0)
Hemoglobin: 13.3 g/dL (ref 12.0–16.0)
Lymphocytes Relative: 27 %
Lymphs Abs: 1.4 10*3/uL (ref 1.0–3.6)
MCH: 30.5 pg (ref 26.0–34.0)
MCHC: 34.3 g/dL (ref 32.0–36.0)
MCV: 89 fL (ref 80.0–100.0)
Monocytes Absolute: 0.4 10*3/uL (ref 0.2–0.9)
Monocytes Relative: 7 %
Neutro Abs: 3.3 10*3/uL (ref 1.4–6.5)
Neutrophils Relative %: 63 %
Platelets: 120 10*3/uL — ABNORMAL LOW (ref 150–440)
RBC: 4.35 MIL/uL (ref 3.80–5.20)
RDW: 14.1 % (ref 11.5–14.5)
WBC: 5.1 10*3/uL (ref 3.6–11.0)

## 2016-05-18 NOTE — Assessment & Plan Note (Addendum)
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 CT July NED. Reviewed the images. Recommend a chest x-ray in 3 months  # Thyroid Ca - on Synthroid 100 mcg /day; await TSH/profile. We will call her with dosing as her Synthroid. Discussed the rationale of using Synthroid.  # follow up in 3 months/ CXR/ labs 1 week before.   # hypocalcemia- vit D+ ca BID

## 2016-05-18 NOTE — Progress Notes (Signed)
[facility] OFFICE PROGRESS NOTE  Patient Care Team: Tonia Ghent, MD as PCP - General (Family Medicine) Beverly Gust, MD (Unknown Physician Specialty) Nestor Lewandowsky, MD as Referring Physician (Cardiothoracic Surgery)  Cancer of lung [facility])   Staging form: Lung, AJCC 7th Edition     Clinical: T1, N0, M0 - Signed by Forest Gleason, MD on 10/01/2015 Thyroid cancer [facility])   Staging form: Thyroid - Papillary or Follicular (Under 45 years), AJCC 7th Edition     Clinical: Stage I (T1, N0, M0) - Signed by Forest Gleason, MD on 09/11/2015    Oncology History   # NOV 2016- RLL [place] ca T1N0 [incidental; Dr.Simonds; Bronc- s/p RLlobectomy; Dr.Oaks;Dec 2016]  # FEB 2017- 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 STAGE I; s/p Total Thyroidetcomy [Dr.Chapman]; Synthroid     Cancer of lung ([place])   10/01/2015 Initial Diagnosis Cancer of lung ([place])    Malignant neoplasm of lower lobe, right bronchus or lung ([place])   05/06/2016 Initial Diagnosis Malignant neoplasm of lower lobe, right bronchus or lung ([place])    INTERVAL HISTORY:  This is my first interaction with the patient as patient's primary oncologist has been Dr.Choksi. I reviewed the patient's prior charts/pertinent labs/imaging in detail; findings are summarized above.   [name] 70 y.o.  female pleasant patient above history of stage I lung cancer status post lobectomy; m and also history of thyroid cancer status post thyroidectomy currently on Synthroid is here for follow-up.  She had recently been followed by thoracic surgery/follow-up CT scan for her lung cancer  Patient admits to better energy with last few weeks is going up on Synthroid 200 g. She denies any palpitations. Denies any chest pain. There is shortness of breath or cough or swelling in the legs.  REVIEW OF SYSTEMS:  A complete 10 point review of system is done which is negative except  mentioned above/history of present illness.   PAST MEDICAL HISTORY :  Past Medical History  Diagnosis Date  . Hyperlipidemia   . Hypertension   . GERD (gastroesophageal reflux disease)   . Groin pain     Along superior/laterl R inguinal canal.  Could be due to hernia or old scar tissue.  prev with CT done w/o alarming findings.  Will treat episodically.  Use tylenol #3 prn.    . Normal cardiac stress test 2014  . Thyroid nodule   . Cancer of lung ([place]) 10/01/2015  . PONV (postoperative nausea and vomiting)     nausea after hysterectomy, and 2 days after lung surgery 10/2015    PAST SURGICAL HISTORY :   Past Surgical History  Procedure Laterality Date  . Esophagogastroduodenoscopy  2002, 11/15/07    Normal (Dr. Allyn Kenner)  . Total vaginal hysterectomy  12/30/08    for fibroid pain (Dr. Gertie Fey)  . Facial cosmetic surgery  01/13/10    mini facelift  . Breast mass excision  1977    benign  . Hernia repair  1976  . Carpal tunnel release Right 1978  . Dilation and curettage of uterus  2001  . Colonoscopy  2009  . Abdominal hysterectomy  12-30-08  . Endobronchial ultrasound N/A 08/25/2015    Procedure: ENDOBRONCHIAL ULTRASOUND;  Surgeon: Flora Lipps, MD;  Location: [facility] ORS;  Service: Cardiopulmonary;  Laterality: N/A;  . Electromagnetic navigation brochoscopy N/A 08/25/2015    Procedure: ELECTROMAGNETIC NAVIGATION BRONCHOSCOPY;  Surgeon: Flora Lipps, MD;  Location: [facility] ORS;  Service: Cardiopulmonary;  Laterality: N/A;  . Thoracotomy/lobectomy Right 10/27/2015    Procedure: THORACOTOMY/LOBECTOMY;  Surgeon: Nestor Lewandowsky, MD;  Location: [facility] ORS;  Service: Thoracic;  Laterality: Right;  . Thyroidectomy N/A 12/29/2015    Procedure: THYROIDECTOMY;  Surgeon: Beverly Gust, MD;  Location: [facility] ORS;  Service: ENT;  Laterality: N/A;    FAMILY HISTORY :   Family History  Problem Relation Age of Onset  . Transient ischemic attack Mother   . Hypertension Mother   . Stroke Mother     mini  strokes  . Hypertension Sister   . Cancer Sister     ovarian  . Hypertension Sister   . Hypertension Sister   . Hypertension Sister   . Breast cancer Neg Hx   . Colon cancer Neg Hx     SOCIAL HISTORY:   Social History  Substance Use Topics  . Smoking status: Never Smoker   . Smokeless tobacco: Never Used  . Alcohol Use: No    ALLERGIES:  is allergic to atorvastatin; ciprofloxacin; epinephrine; metoprolol succinate; simvastatin; and sulfonamide derivatives.  MEDICATIONS:  Current Outpatient Prescriptions  Medication Sig Dispense Refill  . Ascorbic Acid (VITAMIN C) 500 MG tablet Take 1,000 mg by mouth every morning. Reported on 02/04/2016    . Biotin (PA BIOTIN) 1000 MCG tablet Take 1,000 mcg by mouth every morning. Reported on 02/04/2016    . Coenzyme Q10 (COQ10) 50 MG CAPS Take 1 capsule by mouth every evening. Reported on 02/04/2016    . Folic Acid-Vit A6-TKZ S01 (FA-VITAMIN B-6-VITAMIN B-12 PO) 1 tablet daily after breakfast. Reported on 02/04/2016    . levothyroxine (SYNTHROID) 100 MCG tablet Take 1 tablet (100 mcg total) by mouth daily before breakfast. 30 tablet 3  . omeprazole (PRILOSEC) 40 MG capsule Take 40 mg by mouth 2 (two) times daily.  3  . pravastatin (PRAVACHOL) 40 MG tablet Take 1 tablet (40 mg total) by mouth daily. 90 tablet 0  . [DISCONTINUED] calcium gluconate 500 MG tablet Take 500 mg by mouth daily.      . [DISCONTINUED] CALCIUM-MAG-VIT C-VIT D PO Take by mouth daily.      . [DISCONTINUED] metoprolol succinate (TOPROL-XL) 25 MG 24 hr tablet Take 25 mg by mouth at bedtime.      . [DISCONTINUED] simvastatin (ZOCOR) 40 MG tablet Take 40 mg by mouth at bedtime.       No current facility-administered medications for this visit.    PHYSICAL EXAMINATION: ECOG PERFORMANCE STATUS: 0 - Asymptomatic  BP 116/73 mmHg  Pulse 81  Temp(Src) 97.8 F (36.6 C) (Tympanic)  Resp 18  Wt 117 lb 4 oz (53.184 kg)  Filed Weights   05/18/16 1129  Weight: 117 lb 4 oz (53.184 kg)     GENERAL: Well-nourished well-developed; Alert, no distress and comfortable.   Accompanied by husband. EYES: no pallor or icterus OROPHARYNX: no thrush or ulceration; good dentition  NECK: supple, no masses felt LYMPH:  no palpable lymphadenopathy in the cervical, axillary or inguinal regions LUNGS: clear to auscultation and  No wheeze or crackles HEART/CVS: regular rate & rhythm and no murmurs; No lower extremity edema ABDOMEN:abdomen soft, non-tender and normal bowel sounds Musculoskeletal:no cyanosis of digits and no clubbing  PSYCH: alert & oriented x 3 with fluent speech NEURO: no focal motor/sensory deficits SKIN:  no rashes or significant lesions  LABORATORY DATA:  I have reviewed the data as listed    Component Value Date/Time   NA 139 05/18/2016 1035   NA  141 07/01/2013 0103   K 3.7 05/18/2016 1035   K 3.8 07/01/2013 0103   CL 104 05/18/2016 1035   CL 110* 07/01/2013 0103   CO2 30 05/18/2016 1035   CO2 27 07/01/2013 0103   GLUCOSE 93 05/18/2016 1035   GLUCOSE 88 07/01/2013 0103   BUN 15 05/18/2016 1035   BUN 11 07/01/2013 0103   CREATININE 0.65 05/18/2016 1035   CREATININE 0.68 07/01/2013 0103   CALCIUM 8.5* 05/18/2016 1035   CALCIUM 8.2* 07/01/2013 0103   PROT 6.3* 05/18/2016 1035   PROT 5.5* 07/01/2013 0103   ALBUMIN 3.8 05/18/2016 1035   ALBUMIN 3.0* 07/01/2013 0103   AST 21 05/18/2016 1035   AST 19 07/01/2013 0103   ALT 21 05/18/2016 1035   ALT 24 07/01/2013 0103   ALKPHOS 42 05/18/2016 1035   ALKPHOS 55 07/01/2013 0103   BILITOT 0.8 05/18/2016 1035   BILITOT 0.7 07/01/2013 0103   GFRNONAA >60 05/18/2016 1035   GFRNONAA >60 07/01/2013 0103   GFRAA >60 05/18/2016 1035   GFRAA >60 07/01/2013 0103    No results found for: SPEP, UPEP  Lab Results  Component Value Date   WBC 5.1 05/18/2016   NEUTROABS 3.3 05/18/2016   HGB 13.3 05/18/2016   HCT 38.7 05/18/2016   MCV 89.0 05/18/2016   PLT 120* 05/18/2016      Chemistry      Component Value  Date/Time   NA 139 05/18/2016 1035   NA 141 07/01/2013 0103   K 3.7 05/18/2016 1035   K 3.8 07/01/2013 0103   CL 104 05/18/2016 1035   CL 110* 07/01/2013 0103   CO2 30 05/18/2016 1035   CO2 27 07/01/2013 0103   BUN 15 05/18/2016 1035   BUN 11 07/01/2013 0103   CREATININE 0.65 05/18/2016 1035   CREATININE 0.68 07/01/2013 0103      Component Value Date/Time   CALCIUM 8.5* 05/18/2016 1035   CALCIUM 8.2* 07/01/2013 0103   ALKPHOS 42 05/18/2016 1035   ALKPHOS 55 07/01/2013 0103   AST 21 05/18/2016 1035   AST 19 07/01/2013 0103   ALT 21 05/18/2016 1035   ALT 24 07/01/2013 0103   BILITOT 0.8 05/18/2016 1035   BILITOT 0.7 07/01/2013 0103       RADIOGRAPHIC STUDIES: I have personally reviewed the radiological images as listed and agreed with the findings in the report. No results found.   ASSESSMENT & PLAN:  Malignant neoplasm of lower lobe, right bronchus or lung ([place]) # RLL Lung ca; stage I no adjuvant therapy. CT July NED. Reviewed the images. Recommend a chest x-ray in 3 months  # Thyroid Ca - on Synthroid 100 mcg /day; await TSH/profile. We will call her with dosing as her Synthroid. Discussed the rationale of using Synthroid.  # follow up in 3 months/ CXR/ labs 1 week before.   # hypocalcemia- vit D+ ca BID    Orders Placed This Encounter  Procedures  . DG Chest 2 View    Standing Status: Future     Number of Occurrences:      Standing Expiration Date: 05/18/2017    Order Specific Question:  Reason for Exam (SYMPTOM  OR DIAGNOSIS REQUIRED)    Answer:  h/o lung cancer    Order Specific Question:  Preferred imaging location?    Answer:  [facility]   All questions were answered. The patient knows to call the clinic with any problems, questions or concerns.      Lenetta Quaker R  Rogue Bussing, MD 05/18/2016 10:04 PM

## 2016-05-19 ENCOUNTER — Ambulatory Visit: Payer: Self-pay | Admitting: Podiatry

## 2016-05-19 ENCOUNTER — Telehealth: Payer: Self-pay

## 2016-05-19 DIAGNOSIS — Z1159 Encounter for screening for other viral diseases: Secondary | ICD-10-CM

## 2016-05-19 LAB — THYROID PANEL WITH TSH
Free Thyroxine Index: 2.5 (ref 1.2–4.9)
T3 Uptake Ratio: 31 % (ref 24–39)
T4, Total: 8.1 ug/dL (ref 4.5–12.0)
TSH: 0.179 u[IU]/mL — ABNORMAL LOW (ref 0.450–4.500)

## 2016-05-19 NOTE — Telephone Encounter (Signed)
Adding Hep C to labs

## 2016-05-20 ENCOUNTER — Other Ambulatory Visit: Payer: Self-pay | Admitting: Internal Medicine

## 2016-05-20 ENCOUNTER — Other Ambulatory Visit (INDEPENDENT_AMBULATORY_CARE_PROVIDER_SITE_OTHER): Payer: PPO

## 2016-05-20 ENCOUNTER — Ambulatory Visit (INDEPENDENT_AMBULATORY_CARE_PROVIDER_SITE_OTHER): Payer: PPO

## 2016-05-20 VITALS — BP 121/72 | HR 84 | Temp 97.6°F | Ht 64.25 in | Wt 123.2 lb

## 2016-05-20 DIAGNOSIS — C3431 Malignant neoplasm of lower lobe, right bronchus or lung: Secondary | ICD-10-CM

## 2016-05-20 DIAGNOSIS — Z1159 Encounter for screening for other viral diseases: Secondary | ICD-10-CM

## 2016-05-20 DIAGNOSIS — M8580 Other specified disorders of bone density and structure, unspecified site: Secondary | ICD-10-CM | POA: Diagnosis not present

## 2016-05-20 DIAGNOSIS — Z0000 Encounter for general adult medical examination without abnormal findings: Secondary | ICD-10-CM | POA: Diagnosis not present

## 2016-05-20 DIAGNOSIS — C73 Malignant neoplasm of thyroid gland: Secondary | ICD-10-CM | POA: Diagnosis not present

## 2016-05-20 DIAGNOSIS — E785 Hyperlipidemia, unspecified: Secondary | ICD-10-CM

## 2016-05-20 LAB — LIPID PANEL
Cholesterol: 136 mg/dL (ref 0–200)
HDL: 37 mg/dL — ABNORMAL LOW (ref >39.00)
LDL Cholesterol: 73 mg/dL (ref 0–99)
NonHDL: 99.47
Total CHOL/HDL Ratio: 4
Triglycerides: 130 mg/dL (ref 0.0–149.0)
VLDL: 26 mg/dL (ref 0.0–40.0)

## 2016-05-20 LAB — VITAMIN D 25 HYDROXY (VIT D DEFICIENCY, FRACTURES): VITD: 28.72 ng/mL — ABNORMAL LOW (ref 30.00–100.00)

## 2016-05-20 LAB — TSH: TSH: 0.26 u[IU]/mL — ABNORMAL LOW (ref 0.35–4.50)

## 2016-05-20 NOTE — Patient Instructions (Addendum)
Ms. Cubbage , Thank you for taking time to come for your Medicare Wellness Visit. I appreciate your ongoing commitment to your health goals. Please review the following plan we discussed and let me know if I can assist you in the future.   These are the goals we discussed: Goals    . Increase physical activity     Starting 05/20/2016, I will continue to exercise for at least 30 min daily.        This is a list of the screening recommended for you and due dates:  Health Maintenance  Topic Date Due  . Flu Shot  05/31/2016  . Mammogram  05/27/2017  . Colon Cancer Screening  09/15/2018  . Tetanus Vaccine  02/12/2019  . DEXA scan (bone density measurement)  Completed  . Shingles Vaccine  Completed  .  Hepatitis C: One time screening is recommended by Center for Disease Control  (CDC) for  adults born from [birth year] through 1967.   Completed  . Pneumonia vaccines  Completed      Preventive Care for Adults  A healthy lifestyle and preventive care can promote health and wellness. Preventive health guidelines for adults include the following key practices.  . A routine yearly physical is a good way to check with your health care provider about your health and preventive screening. It is a chance to share any concerns and updates on your health and to receive a thorough exam.  . Visit your dentist for a routine exam and preventive care every 6 months. Brush your teeth twice a day and floss once a day. Good oral hygiene prevents tooth decay and gum disease.  . The frequency of eye exams is based on your age, health, family medical history, use  of contact lenses, and other factors. Follow your health care provider's ecommendations for frequency of eye exams.  . Eat a healthy diet. Foods like vegetables, fruits, whole grains, low-fat dairy products, and lean protein foods contain the nutrients you need without too many calories. Decrease your intake of foods high in solid fats, added sugars, and  salt. Eat the right amount of calories for you. Get information about a proper diet from your health care provider, if necessary.  . Regular physical exercise is one of the most important things you can do for your health. Most adults should get at least 150 minutes of moderate-intensity exercise (any activity that increases your heart rate and causes you to sweat) each week. In addition, most adults need muscle-strengthening exercises on 2 or more days a week.  Silver Sneakers may be a benefit available to you. To determine eligibility, you may visit the website: www.silversneakers.com or contact program at 501-337-8950 Mon-Fri between 8AM-8PM.   . Maintain a healthy weight. The body mass index (BMI) is a screening tool to identify possible weight problems. It provides an estimate of body fat based on height and weight. Your health care provider can find your BMI and can help you achieve or maintain a healthy weight.   For adults 20 years and older: ? A BMI below 18.5 is considered underweight. ? A BMI of 18.5 to 24.9 is normal. ? A BMI of 25 to 29.9 is considered overweight. ? A BMI of 30 and above is considered obese.   . Maintain normal blood lipids and cholesterol levels by exercising and minimizing your intake of saturated fat. Eat a balanced diet with plenty of fruit and vegetables. Blood tests for lipids and cholesterol should begin at  age 71 and be repeated every 5 years. If your lipid or cholesterol levels are high, you are over 50, or you are at high risk for heart disease, you may need your cholesterol levels checked more frequently. Ongoing high lipid and cholesterol levels should be treated with medicines if diet and exercise are not working.  . If you smoke, find out from your health care provider how to quit. If you do not use tobacco, please do not start.  . If you choose to drink alcohol, please do not consume more than 2 drinks per day. One drink is considered to be 12 ounces  (355 mL) of beer, 5 ounces (148 mL) of wine, or 1.5 ounces (44 mL) of liquor.  . If you are 38-58 years old, ask your health care provider if you should take aspirin to prevent strokes.  . Use sunscreen. Apply sunscreen liberally and repeatedly throughout the day. You should seek shade when your shadow is shorter than you. Protect yourself by wearing long sleeves, pants, a wide-brimmed hat, and sunglasses year round, whenever you are outdoors.  . Once a month, do a whole body skin exam, using a mirror to look at the skin on your back. Tell your health care provider of new moles, moles that have irregular borders, moles that are larger than a pencil eraser, or moles that have changed in shape or color.

## 2016-05-20 NOTE — Progress Notes (Signed)
Pre visit review using our clinic review tool, if applicable. No additional management support is needed unless otherwise documented below in the visit note. 

## 2016-05-20 NOTE — Progress Notes (Signed)
I reviewed health advisor's note, was available for consultation, and agree with documentation and plan.  

## 2016-05-20 NOTE — Telephone Encounter (Addendum)
Was this already drawn with the other labs from 7/19? If not, can it be added? I need to know if this needs to be done the next time she comes to the clinic.  Please let me know.   Thanks.

## 2016-05-20 NOTE — Progress Notes (Signed)
Subjective:   [name] is a 70 y.o. female who presents for Medicare Annual (Subsequent) preventive examination.  Review of Systems:  N/A Cardiac Risk Factors include: advanced age (>73mn, >>32women);dyslipidemia;hypertension     Objective:     Vitals: BP 121/72 mmHg  Pulse 84  Temp(Src) 97.6 F (36.4 C) (Oral)  Ht 5' 4.25" (1.632 m)  Wt 123 lb 4 oz (55.906 kg)  BMI 20.99 kg/m2  SpO2 96%  Body mass index is 20.99 kg/(m^2).   Tobacco History  Smoking status  . Never Smoker   Smokeless tobacco  . Never Used     Counseling given: No   Past Medical History  Diagnosis Date  . Hyperlipidemia   . Hypertension   . GERD (gastroesophageal reflux disease)   . Groin pain     Along superior/laterl R inguinal canal.  Could be due to hernia or old scar tissue.  prev with CT done w/o alarming findings.  Will treat episodically.  Use tylenol #3 prn.    . Normal cardiac stress test 2014  . Thyroid nodule   . [place] of lung [place]  . PONV (postoperative nausea and vomiting)     nausea after hysterectomy, and 2 days after lung surgery 10/2015   Past Surgical History  Procedure Laterality Date  . Esophagogastroduodenoscopy  2002, 11/15/07    Normal (Dr. MAllyn Kenner  . Total vaginal hysterectomy  12/30/08    for fibroid pain (Dr. TGertie Fey  . Facial cosmetic surgery  01/13/10    mini facelift  . Breast mass excision  1977    benign  . Hernia repair  1976  . Carpal tunnel release Right 1978  . Dilation and curettage of uterus  2001  . Colonoscopy  2009  . Abdominal hysterectomy  12-30-08  . Endobronchial ultrasound N/A 08/25/2015    Procedure: ENDOBRONCHIAL ULTRASOUND;  Surgeon: KFlora Lipps MD;  Location: [facility] ORS;  Service: Cardiopulmonary;  Laterality: N/A;  . Electromagnetic navigation brochoscopy N/A 08/25/2015    Procedure: ELECTROMAGNETIC NAVIGATION BRONCHOSCOPY;  Surgeon: KFlora Lipps MD;  Location: [facility] ORS;  Service: Cardiopulmonary;  Laterality: N/A;  .  Thoracotomy/lobectomy Right 10/27/2015    Procedure: THORACOTOMY/LOBECTOMY;  Surgeon: TNestor Lewandowsky MD;  Location: [facility] ORS;  Service: Thoracic;  Laterality: Right;  . Thyroidectomy N/A 12/29/2015    Procedure: THYROIDECTOMY;  Surgeon: CBeverly Gust MD;  Location: [facility] ORS;  Service: ENT;  Laterality: N/A;   Family History  Problem Relation Age of Onset  . Transient ischemic attack Mother   . Hypertension Mother   . Stroke Mother     mini strokes  . Hypertension Sister   . Cancer Sister     ovarian  . Hypertension Sister   . Hypertension Sister   . Hypertension Sister   . Breast cancer Neg Hx   . Colon cancer Neg Hx    History  Sexual Activity  . Sexual Activity: No    Outpatient Encounter Prescriptions as of 05/20/2016  Medication Sig  . Ascorbic Acid (VITAMIN C) 500 MG tablet Take 1,000 mg by mouth every morning. Reported on 02/04/2016  . Biotin (PA BIOTIN) 1000 MCG tablet Take 1,000 mcg by mouth every morning. Reported on 02/04/2016  . Calcium-Vitamin D-Vitamin K (CALCIUM + D + K PO) Take 1 tablet by mouth daily.  . Cholecalciferol (VITAMIN D-3) 1000 units CAPS Take 1 capsule by mouth daily.  . Coenzyme Q10 (COQ10) 50 MG CAPS Take 1 capsule by mouth every evening. Reported on 02/04/2016  .  Folic Acid-Vit V4-QVZ D63 (FA-VITAMIN B-6-VITAMIN B-12 PO) 1 tablet daily after breakfast. Reported on 02/04/2016  . levothyroxine (SYNTHROID) 100 MCG tablet Take 1 tablet (100 mcg total) by mouth daily before breakfast.  . omeprazole (PRILOSEC) 40 MG capsule Take 40 mg by mouth 2 (two) times daily.  . Potassium Gluconate 595 MG CAPS Take 1 capsule by mouth daily.  . pravastatin (PRAVACHOL) 40 MG tablet Take 1 tablet (40 mg total) by mouth daily.   No facility-administered encounter medications on file as of 05/20/2016.    Activities of Daily Living In your present state of health, do you have any difficulty performing the following activities: 05/20/2016 12/29/2015  Hearing? [place]? N -    Difficulty concentrating or making decisions? N -  Walking or climbing stairs? N -  Dressing or bathing? N -  Doing errands, shopping? N N  Preparing Food and eating ? N -  Using the Toilet? N -  In the past six months, have you accidently leaked urine? N -  Do you have problems with loss of bowel control? N -  Managing your Medications? N -  Managing your Finances? N -  Housekeeping or managing your Housekeeping? N -    Patient Care Team: Tonia Ghent, MD as PCP - General (Family Medicine) Beverly Gust, MD (Unknown Physician Specialty) Nestor Lewandowsky, MD as Referring Physician (Cardiothoracic Surgery) Crista Curb. Gloriann Loan, MD as Referring Physician (Ophthalmology) Cammie Sickle, MD as Consulting Physician (Internal Medicine) Hubbard Robinson, MD as Consulting Physician (Surgery)    Assessment:     Hearing Screening   '125Hz'$  '250Hz'$  '500Hz'$  '1000Hz'$  '2000Hz'$  '4000Hz'$  '8000Hz'$   Right ear:   40 0 40 40   Left ear:   40 0 40 40   Vision Screening Comments: Last vision exam in April 2017 with Dr. Gloriann Loan   Exercise Activities and Dietary recommendations Current Exercise Habits: Home exercise routine;Structured exercise class, Type of exercise: walking, Time (Minutes): 30, Frequency (Times/Week): 7, Weekly Exercise (Minutes/Week): 210, Intensity: Moderate, Exercise limited by: None identified  Goals    . Increase physical activity     Starting 05/20/2016, I will continue to exercise for at least 30 min daily.       Fall Risk Fall Risk  05/20/2016 05/21/2015 05/13/2014 05/07/2013  Falls in the past year? No No No No   Depression Screen PHQ 2/9 Scores 05/20/2016 05/21/2015 05/13/2014 05/07/2013  PHQ - 2 Score 0 0 0 0     Cognitive Testing MMSE - Mini Mental State Exam 05/20/2016  Orientation to time 5  Orientation to Place 5  Registration 3  Attention/ Calculation 0  Recall 3  Language- name 2 objects 0  Language- repeat 1  Language- follow 3 step command 3  Language- read & follow  direction 0  Write a sentence 0  Copy design 0  Total score 20   PLEASE NOTE: A Mini-Cog screen was completed. Maximum score is 20. A value of 0 denotes this part of [place] MMSE was not completed or the patient failed this part of the Mini-Cog screening.   Mini-Cog Screening Orientation to Time - Max 5 pts Orientation to Place - Max 5 pts Registration - Max 3 pts Recall - Max 3 pts Language Repeat - Max 1 pts Language Follow 3 Step Command - Max 3 pts  Immunization History  Administered Date(s) Administered  . Influenza Split 08/10/2012  . Influenza,inj,Quad PF,36+ Mos 08/13/2015  . Influenza-Unspecified 07/01/2013, 07/01/2014  . Pneumococcal  Conjugate-13 05/13/2014  . Pneumococcal Polysaccharide-23 02/17/2010  . Td 02/11/2009  . Zoster 05/07/2010   Screening Tests Health Maintenance  Topic Date Due  . INFLUENZA VACCINE  05/31/2016  . MAMMOGRAM  05/27/2017  . COLONOSCOPY  09/15/2018  . TETANUS/TDAP  02/12/2019  . DEXA SCAN  Completed  . ZOSTAVAX  Completed  . Hepatitis C Screening  Completed  . PNA vac Low Risk Adult  Completed      Plan:     I have personally reviewed and addressed the Medicare Annual Wellness questionnaire and have noted the following in the patient's chart:  A. Medical and social history B. Use of alcohol, tobacco or illicit drugs  C. Current medications and supplements D. Functional ability and status E.  Nutritional status F.  Physical activity G. Advance directives H. List of other physicians I.  Hospitalizations, surgeries, and ER visits in previous 12 months J.  [place] to include hearing, vision, cognitive, depression L. Referrals and appointments - none  In addition, I have reviewed and discussed with patient certain preventive protocols, quality metrics, and best practice recommendations. A written personalized care plan for preventive services as well as general preventive health recommendations were provided to  patient.  See attached scanned questionnaire for additional information.   Signed,   Lindell Noe, MHA, BS, LPN Health Advisor

## 2016-05-20 NOTE — Progress Notes (Signed)
PCP notes:  Health maintenance:   Hep C screening - completed  Abnormal screenings:  Hearing - failed  Patient concerns: None  Nurse concerns: None  Next PCP appt: 05/24/16 @ 1415

## 2016-05-21 LAB — THYROGLOBULIN LEVEL: Thyroglobulin: <0.1 ng/mL — ABNORMAL LOW (ref 2.8–40.9)

## 2016-05-21 LAB — T4: T4, Total: 10.3 ug/dL (ref 4.5–12.0)

## 2016-05-21 LAB — HEPATITIS C ANTIBODY: HCV Ab: NEGATIVE

## 2016-05-22 LAB — THYROGLOBULIN LEVEL: Thyroglobulin: 14 ng/mL

## 2016-05-24 ENCOUNTER — Telehealth: Payer: Self-pay

## 2016-05-24 ENCOUNTER — Ambulatory Visit (INDEPENDENT_AMBULATORY_CARE_PROVIDER_SITE_OTHER): Payer: PPO | Admitting: Family Medicine

## 2016-05-24 ENCOUNTER — Encounter: Payer: Self-pay | Admitting: Family Medicine

## 2016-05-24 DIAGNOSIS — K219 Gastro-esophageal reflux disease without esophagitis: Secondary | ICD-10-CM

## 2016-05-24 DIAGNOSIS — C73 Malignant neoplasm of thyroid gland: Secondary | ICD-10-CM

## 2016-05-24 DIAGNOSIS — C3490 Malignant neoplasm of unspecified part of unspecified bronchus or lung: Secondary | ICD-10-CM

## 2016-05-24 DIAGNOSIS — E785 Hyperlipidemia, unspecified: Secondary | ICD-10-CM | POA: Diagnosis not present

## 2016-05-24 DIAGNOSIS — M8580 Other specified disorders of bone density and structure, unspecified site: Secondary | ICD-10-CM | POA: Diagnosis not present

## 2016-05-24 MED ORDER — PRAVASTATIN SODIUM 40 MG PO TABS: 40.0000 mg | ORAL_TABLET | Freq: Every day | ORAL | 3 refills | Status: DC

## 2016-05-24 MED ORDER — OMEPRAZOLE 40 MG PO CPDR: 40.0000 mg | DELAYED_RELEASE_CAPSULE | Freq: Every day | ORAL | 3 refills | Status: DC

## 2016-05-24 NOTE — Patient Instructions (Addendum)
I'll check on getting your mammogram and bone density test set up on the same day.   Don't change your meds for now.  I'll await your other notes.   Take care.  Glad to see you.

## 2016-05-24 NOTE — Telephone Encounter (Signed)
Called pt and informed pt that per Dr. B  continue current dose of Synthroid; follow up as planned.  Pt verbalized an understanding with no other concerns.

## 2016-05-24 NOTE — Progress Notes (Signed)
Pre visit review using our clinic review tool, if applicable. No additional management support is needed unless otherwise documented below in the visit note.  D/w pt about hearing screening.  Declined hearing aids.    Thyroid cancer.  Per oncology.  D/w pt.    Lung cancer.  Per oncology.  D/w pt.    Elevated Cholesterol: Using medications without problems:yes Muscle aches: no Diet compliance:yes Exercise:yes, walking for exercise.    Due for DXA and mammogram.    GERD controlled with med and diet.  She has failed trial off med.     PMH and SH reviewed  ROS: Per HPI unless specifically indicated in ROS section   Meds, vitals, and allergies reviewed.   GEN: nad, alert and oriented HEENT: mucous membranes moist NECK: supple w/o LA, thyroidectomy scar healed.   CV: rrr. PULM: ctab, no inc wob ABD: soft, +bs EXT: no edema SKIN: no acute rash

## 2016-05-25 ENCOUNTER — Other Ambulatory Visit: Payer: Self-pay | Admitting: Family Medicine

## 2016-05-25 DIAGNOSIS — M8580 Other specified disorders of bone density and structure, unspecified site: Secondary | ICD-10-CM

## 2016-05-25 NOTE — Assessment & Plan Note (Signed)
We'll work on getting DXA set up.  Vit nearly normal, she recently restarted vit D.  D/w pt.  >25 minutes spent in face to face time with patient, >50% spent in counselling or coordination of care.

## 2016-05-25 NOTE — Assessment & Plan Note (Addendum)
GERD controlled with med and diet.  She has failed trial off med.  Continue as is.  She agrees.

## 2016-05-25 NOTE — Assessment & Plan Note (Signed)
Controlled, continue as is. Labs d/w pt.  She agrees.

## 2016-05-25 NOTE — Assessment & Plan Note (Signed)
Per onc.  App help of all involved.

## 2016-05-30 ENCOUNTER — Other Ambulatory Visit: Payer: Self-pay | Admitting: Family Medicine

## 2016-05-30 DIAGNOSIS — Z1231 Encounter for screening mammogram for malignant neoplasm of breast: Secondary | ICD-10-CM

## 2016-06-03 ENCOUNTER — Encounter: Payer: Self-pay | Admitting: Surgery

## 2016-06-03 ENCOUNTER — Ambulatory Visit (INDEPENDENT_AMBULATORY_CARE_PROVIDER_SITE_OTHER): Payer: PPO | Admitting: Surgery

## 2016-06-03 VITALS — BP 114/71 | HR 91 | Temp 98.3°F | Wt 117.0 lb

## 2016-06-03 DIAGNOSIS — L723 Sebaceous cyst: Secondary | ICD-10-CM | POA: Diagnosis not present

## 2016-06-03 NOTE — Progress Notes (Signed)
   Sebaceous Cyst Excision Procedure Note  Pre-operative Diagnosis: Sebaceous cyst 3cm  Post-operative Diagnosis: same  Locations:left hip  Indications: 70 year old female well-known to the surgery service with a left hip sebaceous cyst has been there for years. Patient was seen last month in the office with this and thought it was increasing in size. Patient has noticed no fever chills or drainage from the area  Anesthesia: Lidocaine 1% with epinephrine without added sodium bicarbonate  Procedure Details  History of allergy to iodine: no  Patient informed of the risks (including bleeding and infection) and benefits of the  procedure and Written informed consent obtained.  The lesion and surrounding area was given a sterile prep using betadyne and draped in the usual sterile fashion. An incision was made over the cyst, which was dissected free of the surrounding tissue and removed.  The cyst was filled with typical sebaceous material.  The wound was closed with 3-0 Vicryl using simple interrupted stitches. Stitches of continuous running 4-0 Monocryl in a subcuticular layer were used to close the skin. Sterile glue was then applied The specimen was not sent for pathologic examination. The patient tolerated the procedure well.  EBL: 1 ml  Findings: Sebaceous material excised from the wound, total cyst cavity removed  Condition: Stable  Complications: none.  Plan: 1. Instructed to keep the wound dry and covered for 24-48h and clean thereafter. 2. Warning signs of infection were reviewed.   3. Recommended that the patient use OTC analgesics as needed for pain.  4. Return for wound check 1 week

## 2016-06-07 ENCOUNTER — Ambulatory Visit
Admission: RE | Admit: 2016-06-07 | Discharge: 2016-06-07 | Disposition: A | Discharge status: Home / Self Care | Payer: PPO | Source: Ambulatory Visit | Attending: Family Medicine | Admitting: Family Medicine

## 2016-06-07 DIAGNOSIS — M8580 Other specified disorders of bone density and structure, unspecified site: Secondary | ICD-10-CM

## 2016-06-07 DIAGNOSIS — M8589 Other specified disorders of bone density and structure, multiple sites: Secondary | ICD-10-CM | POA: Diagnosis not present

## 2016-06-07 DIAGNOSIS — Z1231 Encounter for screening mammogram for malignant neoplasm of breast: Secondary | ICD-10-CM

## 2016-06-07 DIAGNOSIS — Z780 Asymptomatic menopausal state: Secondary | ICD-10-CM | POA: Diagnosis not present

## 2016-06-10 ENCOUNTER — Ambulatory Visit (INDEPENDENT_AMBULATORY_CARE_PROVIDER_SITE_OTHER): Payer: PPO

## 2016-06-10 VITALS — BP 101/66 | HR 87 | Temp 98.7°F | Wt 119.0 lb

## 2016-06-10 DIAGNOSIS — Z4802 Encounter for removal of sutures: Secondary | ICD-10-CM

## 2016-06-10 NOTE — Progress Notes (Signed)
Patient came in to the office for a wound check after a week of a lipoma removal on 06/03/2016 by Dr. Azalee Course. Patient's incision was perfectly fine with no redness or drainage. Patient stated that she has been feeling good with no complications, pain or fever. Patient was told to give [place] a call if she noticed any signs of infection: redness, fever, or pain at incision site. Patient was also told to give [place] a call if she had any questions or concerns. Patient agreed.

## 2016-06-17 ENCOUNTER — Emergency Department
Admission: EM | Admit: 2016-06-17 | Discharge: 2016-06-17 | Disposition: A | Discharge status: Home / Self Care | Payer: PPO | Attending: Emergency Medicine | Admitting: Emergency Medicine

## 2016-06-17 ENCOUNTER — Encounter: Payer: Self-pay | Admitting: Emergency Medicine

## 2016-06-17 ENCOUNTER — Emergency Department: Payer: PPO

## 2016-06-17 ENCOUNTER — Telehealth: Payer: Self-pay | Admitting: Family Medicine

## 2016-06-17 DIAGNOSIS — Z85118 Personal history of other malignant neoplasm of bronchus and lung: Secondary | ICD-10-CM | POA: Diagnosis not present

## 2016-06-17 DIAGNOSIS — Z85850 Personal history of malignant neoplasm of thyroid: Secondary | ICD-10-CM | POA: Diagnosis not present

## 2016-06-17 DIAGNOSIS — Z79899 Other long term (current) drug therapy: Secondary | ICD-10-CM | POA: Diagnosis not present

## 2016-06-17 DIAGNOSIS — I10 Essential (primary) hypertension: Secondary | ICD-10-CM | POA: Insufficient documentation

## 2016-06-17 DIAGNOSIS — R42 Dizziness and giddiness: Secondary | ICD-10-CM | POA: Diagnosis not present

## 2016-06-17 LAB — COMPREHENSIVE METABOLIC PANEL
ALT: 24 U/L (ref 14–54)
AST: 27 U/L (ref 15–41)
Albumin: 4.2 g/dL (ref 3.5–5.0)
Alkaline Phosphatase: 42 U/L (ref 38–126)
Anion gap: 6 (ref 5–15)
BUN: 16 mg/dL (ref 6–20)
CO2: 29 mmol/L (ref 22–32)
Calcium: 8.8 mg/dL — ABNORMAL LOW (ref 8.9–10.3)
Chloride: 104 mmol/L (ref 101–111)
Creatinine, Ser: 0.59 mg/dL (ref 0.44–1.00)
GFR calc Af Amer: >60 mL/min (ref >60)
GFR calc non Af Amer: >60 mL/min (ref >60)
Glucose, Bld: 130 mg/dL — ABNORMAL HIGH (ref 65–99)
Potassium: 4.2 mmol/L (ref 3.5–5.1)
Sodium: 139 mmol/L (ref 135–145)
Total Bilirubin: 0.6 mg/dL (ref 0.3–1.2)
Total Protein: 6.8 g/dL (ref 6.5–8.1)

## 2016-06-17 LAB — CBC
HCT: 42 % (ref 35.0–47.0)
Hemoglobin: 14.5 g/dL (ref 12.0–16.0)
MCH: 30.6 pg (ref 26.0–34.0)
MCHC: 34.6 g/dL (ref 32.0–36.0)
MCV: 88.4 fL (ref 80.0–100.0)
Platelets: 116 10*3/uL — ABNORMAL LOW (ref 150–440)
RBC: 4.75 MIL/uL (ref 3.80–5.20)
RDW: 14.4 % (ref 11.5–14.5)
WBC: 8.7 10*3/uL (ref 3.6–11.0)

## 2016-06-17 LAB — LIPASE, BLOOD: Lipase: 30 U/L (ref 11–51)

## 2016-06-17 MED ORDER — MECLIZINE HCL 25 MG PO TABS: 25.0000 mg | ORAL_TABLET | Freq: Three times a day (TID) | ORAL | 0 refills | Status: DC | PRN

## 2016-06-17 MED ORDER — MECLIZINE HCL 25 MG PO TABS
25.0000 mg | ORAL_TABLET | Freq: Once | ORAL | Status: AC
  Administered 2016-06-17: 25 mg via ORAL

## 2016-06-17 MED ORDER — MECLIZINE HCL 25 MG PO TABS
ORAL_TABLET | ORAL | Status: AC
  Administered 2016-06-17: 25 mg via ORAL
  Filled 2016-06-17: qty 1

## 2016-06-17 NOTE — ED Provider Notes (Signed)
[facility] Emergency Department Provider Note  Time seen: 12:14 PM  I have reviewed the triage vital signs and the nursing notes.   HISTORY  Chief Complaint Dizziness and Emesis    HPI [name] is a 70 y.o. female the past medical history gastric reflux, hypertension, hyperlipidemia, past history of lung cancer, who presents the emergency department for dizziness. According to the patient upon first awakening this morning around 6:00 she states she rolled over in bed sat up and felt very dizzy as if the room was spinning around her. States she laid back down however around 7-7 30 she had the same symptoms once again on getting up. States she attempted to eat to see if that would help with her symptoms but shortly afterward she vomited. States a very mild headache currently but denies any headache earlier today. Denies any focal weakness or numbness. Patient states this happened approximately 5 years ago was short-lived and resolved with over-the-counter meclizine or Dramamineper patient. Patient called her primary care doctor this morning, but they did not have an opening to see her until Monday so they sent to the emergency department. Patient states while lying in the emergency department her symptoms are largely resolved. Denies any dizziness currently. Denies any chest pain at any point. States nausea with one episode of vomiting. Denies abdominal pain. Denies fever. Denies focal weakness or numbness.  Past Medical History:  Diagnosis Date  . Cancer of lung ([place]) 10/01/2015  . GERD (gastroesophageal reflux disease)   . Groin pain    Along superior/laterl R inguinal canal.  Could be due to hernia or old scar tissue.  prev with CT done w/o alarming findings.  Will treat episodically.  Use tylenol #3 prn.    . Hyperlipidemia   . Hypertension   . Lipoma of thigh    Left  . Normal cardiac stress test 2014  . PONV (postoperative nausea and vomiting)    nausea  after hysterectomy, and 2 days after lung surgery 10/2015  . Thyroid nodule     Patient Active Problem List   Diagnosis Date Noted  . Sebaceous cyst 05/10/2016  . Malignant neoplasm of lower lobe, right bronchus or lung ([place]) 05/06/2016  . Thyroid nodule 12/29/2015  . 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 Cancer of lung ([place]) 10/01/2015  . Thyroid cancer ([place]) 09/11/2015  . Adenopathy   . Angina pectoris ([place]) 07/02/2014  . Advance care planning 05/14/2014  . Thrombocytopenia, unspecified ([place]) 07/03/2013  . Thrombocytopenia ([place]) 07/03/2013  . Osteopenia 05/05/2013  . Medicare annual wellness visit, subsequent 04/26/2012  . GERD (gastroesophageal reflux disease) 04/26/2012  . Groin pain 01/12/2012  . Abdominal pain 01/12/2012  . Essential hypertension 11/17/2010  . HLD (hyperlipidemia) 01/31/2008  . 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 ABDOMINAL HYSTERECTOMY  12-30-08  . BREAST MASS EXCISION  1977   benign  . CARPAL TUNNEL RELEASE Right 1978  . COLONOSCOPY  2009  . DILATION AND CURETTAGE OF UTERUS  2001  . ELECTROMAGNETIC NAVIGATION BROCHOSCOPY N/A 08/25/2015   Procedure: ELECTROMAGNETIC NAVIGATION BRONCHOSCOPY;  Surgeon: Flora Lipps, MD;  Location: [facility] ORS;  Service: Cardiopulmonary;  Laterality: N/A;  . ENDOBRONCHIAL ULTRASOUND N/A 08/25/2015   Procedure: ENDOBRONCHIAL ULTRASOUND;  Surgeon: Flora Lipps, MD;  Location: [facility] ORS;  Service: Cardiopulmonary;  Laterality: N/A;  . ESOPHAGOGASTRODUODENOSCOPY  2002, 11/15/07   Normal (Dr. Allyn Kenner)  . FACIAL COSMETIC SURGERY  01/13/10  mini facelift  . HERNIA REPAIR  1976  . THORACOTOMY/LOBECTOMY Right 10/27/2015   Procedure: THORACOTOMY/LOBECTOMY;  Surgeon: Nestor Lewandowsky, MD;  Location: [facility] ORS;  Service: Thoracic;  Laterality: Right;  . THYROIDECTOMY N/A 12/29/2015   Procedure: THYROIDECTOMY;  Surgeon: Beverly Gust, MD;  Location: [facility] ORS;   Service: ENT;  Laterality: N/A;  . TOTAL VAGINAL HYSTERECTOMY  12/30/08   for fibroid pain (Dr. Gertie Fey)    Prior to Admission medications   Medication Sig Start Date End Date Taking? Authorizing Provider  Ascorbic Acid (VITAMIN C) 500 MG tablet Take 1,000 mg by mouth every morning. Reported on 02/04/2016    Historical Provider, MD  Biotin (PA BIOTIN) 1000 MCG tablet Take 1,000 mcg by mouth every morning. Reported on 02/04/2016    Historical Provider, MD  Calcium-Vitamin D-Vitamin K (CALCIUM + D + K PO) Take 1 tablet by mouth daily.    Historical Provider, MD  Cholecalciferol (VITAMIN D-3) 1000 units CAPS Take 1 capsule by mouth daily.    Historical Provider, MD  Coenzyme Q10 (COQ10) 50 MG CAPS Take 1 capsule by mouth every evening. Reported on 02/04/2016    Historical Provider, MD  Folic Acid-Vit H1-TAV W97 (FA-VITAMIN B-6-VITAMIN B-12 PO) 1 tablet daily after breakfast. Reported on 02/04/2016    Historical Provider, MD  levothyroxine (SYNTHROID) 100 MCG tablet Take 1 tablet (100 mcg total) by mouth daily before breakfast. 03/30/16   Forest Gleason, MD  omeprazole (PRILOSEC) 40 MG capsule Take 1 capsule (40 mg total) by mouth daily. 05/24/16   Tonia Ghent, MD  Potassium Gluconate 595 MG CAPS Take 1 capsule by mouth daily.    Historical Provider, MD  pravastatin (PRAVACHOL) 40 MG tablet Take 1 tablet (40 mg total) by mouth daily. 05/24/16   Tonia Ghent, MD    Allergies  Allergen Reactions  . Atorvastatin     REACTION: myalgias  . Ciprofloxacin     REACTION: increase reflux  . Epinephrine     Heart racing with dental injection  . Metoprolol Succinate Swelling    Swelling and stiffness in hands and ankles  . Simvastatin Other (See Comments)    Muscle aches and stiffness  . Sulfonamide Derivatives     REACTION: itching    Family History  Problem Relation Age of Onset  . Transient ischemic attack Mother   . Hypertension Mother   . Stroke Mother     mini strokes  . Hypertension Sister    . Cancer Sister     ovarian  . Hypertension Sister   . Hypertension Sister   . Hypertension Sister   . Breast cancer Neg Hx   . Colon cancer Neg Hx     Social History Social History  Substance Use Topics  . Smoking status: Never Smoker  . Smokeless tobacco: Never Used  . Alcohol use No    Review of Systems Constitutional: Negative for fever.Positive for dizziness, resolved currently. Cardiovascular: Negative for chest pain. Respiratory: Negative for shortness of breath. Gastrointestinal: Negative for abdominal pain Musculoskeletal: Negative for back pain Neurological: Negative for headaches, focal weakness or numbness. 10-point ROS otherwise negative.  ____________________________________________   PHYSICAL EXAM:  VITAL SIGNS: ED Triage Vitals  Enc Vitals Group     BP 06/17/16 1131 128/67     Pulse Rate 06/17/16 1131 83     Resp 06/17/16 1131 18     Temp 06/17/16 1131 97.6 F (36.4 C)     Temp Source 06/17/16 1131 Oral  SpO2 06/17/16 1131 100 %     Weight 06/17/16 1131 119 lb (54 kg)     Height 06/17/16 1131 '5\' 4"'$  (1.626 m)     Head Circumference --      Peak Flow --      Pain Score 06/17/16 1132 0     Pain Loc --      Pain Edu? --      Excl. in [place]? --     Constitutional: Alert and oriented. Well appearing and in no distress. Eyes: Normal exam ENT   Head: Normocephalic and atraumatic.Normal tympanic membranes.   Mouth/Throat: Mucous membranes are moist. Cardiovascular: Normal rate, regular rhythm. No murmur Respiratory: Normal respiratory effort without tachypnea nor retractions. Breath sounds are clear Gastrointestinal: Soft and nontender. No distention.   Musculoskeletal: Nontender with normal range of motion in all extremities.  Neurologic:  Normal speech and language. No gross focal neurologic deficits. 5/5 motor in all extremities. No pronator drift. Skin:  Skin is warm, dry and intact.  Psychiatric: Mood and affect are normal. Speech and  behavior are normal.     RADIOLOGY  CT head shows no acute findings.  ____________________________________________   INITIAL IMPRESSION / ASSESSMENT AND PLAN / ED COURSE  Pertinent labs & imaging results that were available during my care of the patient were reviewed by me and considered in my medical decision making (see chart for details).  Patient presents the emergency department with dizziness. Patient describes the dizziness as a spinning sensation which started upon first sitting up in bed. Sounds very consistent with BPPV. Patient has a normal physical examination currently. Denies any dizziness at this time. We will check labs, obtain a CT scan of the head as a precaution, and treat with meclizine to hopefully help prevent further symptoms from occurring. Overall the patient appears very well at this time.  CT head negative.  Patient's labs are largely within normal limits. Patient denies any symptoms. States no dizziness since arriving to the emergency department. States her nausea is also completely resolved. We'll discharge with meclizine, ENT follow-up, the patient has seen Dr. Tami Ribas before. I discussed return precautions for any increased dizziness, or focal deficit. The patient is agreeable to plan.  ____________________________________________   FINAL CLINICAL IMPRESSION(S) / ED DIAGNOSES  Dizziness Vertigo    Harvest Dark, MD 06/17/16 1444

## 2016-06-17 NOTE — Telephone Encounter (Signed)
In ER currently by EMR notes.  I'll await ER report.

## 2016-06-17 NOTE — Telephone Encounter (Signed)
[facility]  Patient Name: [name]  DOB: January 06, 1946    Initial Comment Caller says, woke up this morning dizzy, She vomited twice after eating.    Nurse Assessment  Nurse: Wynetta Emery, RN, Baker Janus Date/Time Eilene Ghazi Time): 06/17/2016 10:03:27 AM  Confirm and document reason for call. If symptomatic, describe symptoms. You must click the next button to save text entered. ---Woke up in the middle of night dizzy went back to sleep -- woke back up and felt room was spinning -- dizziness did not go away after she ate. vomited x 2 episodes and is in bed at this time room has stopped spinning.  Has the patient traveled out of the country within the last 30 days? ---No  Does the patient have any new or worsening symptoms? ---Yes  Will a triage be completed? ---Yes  Related visit to physician within the last 2 weeks? ---No  Does the PT have any chronic conditions? (i.e. diabetes, asthma, etc.) ---No  Is this a behavioral health or substance abuse call? ---No     Guidelines    Guideline Title Affirmed Question Affirmed Notes  Dizziness - Vertigo [1] MODERATE dizziness (e.g., vertigo; feels very unsteady, interferes with normal activities) AND [2] has NOT been evaluated by physician for this    Final Disposition User   See Physician within [place], RN, Baker Janus    Comments  No available appts made and does not think she can make it to tomorrow going to ED at this time Checked appts in [facility] and [place] station location.   Referrals  [facility]   Disagree/Comply: Comply

## 2016-06-17 NOTE — ED Triage Notes (Signed)
Pt to ed with c/o dizziness and n/v this am.  Pt denies diarrhea.  Pt denies chest pain.  Denies blurred vision.

## 2016-06-21 DIAGNOSIS — R42 Dizziness and giddiness: Secondary | ICD-10-CM | POA: Diagnosis not present

## 2016-07-12 DIAGNOSIS — H25813 Combined forms of age-related cataract, bilateral: Secondary | ICD-10-CM | POA: Diagnosis not present

## 2016-07-18 ENCOUNTER — Other Ambulatory Visit: Payer: Self-pay | Admitting: Oncology

## 2016-07-18 DIAGNOSIS — C73 Malignant neoplasm of thyroid gland: Secondary | ICD-10-CM

## 2016-08-10 ENCOUNTER — Telehealth: Payer: Self-pay | Admitting: Cardiothoracic Surgery

## 2016-08-10 DIAGNOSIS — C3431 Malignant neoplasm of lower lobe, right bronchus or lung: Secondary | ICD-10-CM

## 2016-08-10 NOTE — Telephone Encounter (Signed)
Pain on right side rib area where tube was. At times, it feels like it's going to cave in. Should she she Dr. Genevive Bi or her PCP? Please call. Will take tylenol at times.

## 2016-08-10 NOTE — Telephone Encounter (Signed)
Patient called back and I told her that Dr. Genevive Bi would like for her to be seen. I also told patient that Dr. Genevive Bi wanted her to have a chest x-ray prior to her appointment. Patient agreed and stated that she would come in on Friday.   Chest X-ray was ordered.

## 2016-08-10 NOTE — Telephone Encounter (Signed)
Called patient and had to leave a voicemail.  I spoke with Dr. Genevive Bi in reference to the patient and he recommended for her to be seen on Friday. Prior to her appointment she will need a chest X-Ray which have been ordered.

## 2016-08-11 ENCOUNTER — Inpatient Hospital Stay: Payer: PPO | Attending: Internal Medicine

## 2016-08-11 DIAGNOSIS — C3431 Malignant neoplasm of lower lobe, right bronchus or lung: Secondary | ICD-10-CM | POA: Insufficient documentation

## 2016-08-11 DIAGNOSIS — C73 Malignant neoplasm of thyroid gland: Secondary | ICD-10-CM | POA: Diagnosis not present

## 2016-08-11 DIAGNOSIS — M8580 Other specified disorders of bone density and structure, unspecified site: Secondary | ICD-10-CM | POA: Diagnosis not present

## 2016-08-11 DIAGNOSIS — Z79899 Other long term (current) drug therapy: Secondary | ICD-10-CM | POA: Diagnosis not present

## 2016-08-11 DIAGNOSIS — I10 Essential (primary) hypertension: Secondary | ICD-10-CM | POA: Diagnosis not present

## 2016-08-11 DIAGNOSIS — R1011 Right upper quadrant pain: Secondary | ICD-10-CM | POA: Insufficient documentation

## 2016-08-11 DIAGNOSIS — E785 Hyperlipidemia, unspecified: Secondary | ICD-10-CM | POA: Insufficient documentation

## 2016-08-11 DIAGNOSIS — K219 Gastro-esophageal reflux disease without esophagitis: Secondary | ICD-10-CM | POA: Insufficient documentation

## 2016-08-11 LAB — COMPREHENSIVE METABOLIC PANEL
ALT: 21 U/L (ref 14–54)
AST: 23 U/L (ref 15–41)
Albumin: 3.8 g/dL (ref 3.5–5.0)
Alkaline Phosphatase: 42 U/L (ref 38–126)
Anion gap: 7 (ref 5–15)
BUN: 17 mg/dL (ref 6–20)
CO2: 25 mmol/L (ref 22–32)
Calcium: 8.7 mg/dL — ABNORMAL LOW (ref 8.9–10.3)
Chloride: 105 mmol/L (ref 101–111)
Creatinine, Ser: 0.62 mg/dL (ref 0.44–1.00)
GFR calc Af Amer: >60 mL/min (ref >60)
GFR calc non Af Amer: >60 mL/min (ref >60)
Glucose, Bld: 120 mg/dL — ABNORMAL HIGH (ref 65–99)
Potassium: 3.9 mmol/L (ref 3.5–5.1)
Sodium: 137 mmol/L (ref 135–145)
Total Bilirubin: 1 mg/dL (ref 0.3–1.2)
Total Protein: 6.3 g/dL — ABNORMAL LOW (ref 6.5–8.1)

## 2016-08-11 LAB — CBC WITH DIFFERENTIAL/PLATELET
Basophils Absolute: 0.1 10*3/uL (ref 0–0.1)
Basophils Relative: 1 %
Eosinophils Absolute: 0.1 10*3/uL (ref 0–0.7)
Eosinophils Relative: 1 %
HCT: 39.9 % (ref 35.0–47.0)
Hemoglobin: 13.5 g/dL (ref 12.0–16.0)
Lymphocytes Relative: 25 %
Lymphs Abs: 1.7 10*3/uL (ref 1.0–3.6)
MCH: 30 pg (ref 26.0–34.0)
MCHC: 34 g/dL (ref 32.0–36.0)
MCV: 88.2 fL (ref 80.0–100.0)
Monocytes Absolute: 0.5 10*3/uL (ref 0.2–0.9)
Monocytes Relative: 8 %
Neutro Abs: 4.3 10*3/uL (ref 1.4–6.5)
Neutrophils Relative %: 65 %
Platelets: 124 10*3/uL — ABNORMAL LOW (ref 150–440)
RBC: 4.52 MIL/uL (ref 3.80–5.20)
RDW: 14.8 % — ABNORMAL HIGH (ref 11.5–14.5)
WBC: 6.6 10*3/uL (ref 3.6–11.0)

## 2016-08-12 ENCOUNTER — Ambulatory Visit
Admission: RE | Admit: 2016-08-12 | Discharge: 2016-08-12 | Disposition: A | Discharge status: Home / Self Care | Payer: PPO | Source: Ambulatory Visit | Attending: Internal Medicine | Admitting: Internal Medicine

## 2016-08-12 ENCOUNTER — Ambulatory Visit (INDEPENDENT_AMBULATORY_CARE_PROVIDER_SITE_OTHER): Payer: PPO | Admitting: Cardiothoracic Surgery

## 2016-08-12 ENCOUNTER — Ambulatory Visit (INDEPENDENT_AMBULATORY_CARE_PROVIDER_SITE_OTHER): Payer: PPO | Admitting: Surgery

## 2016-08-12 ENCOUNTER — Encounter: Payer: Self-pay | Admitting: Cardiothoracic Surgery

## 2016-08-12 ENCOUNTER — Ambulatory Visit
Admission: RE | Admit: 2016-08-12 | Discharge: 2016-08-12 | Disposition: A | Discharge status: Home / Self Care | Payer: PPO | Source: Ambulatory Visit | Attending: Cardiothoracic Surgery | Admitting: Cardiothoracic Surgery

## 2016-08-12 ENCOUNTER — Encounter: Payer: Self-pay | Admitting: Surgery

## 2016-08-12 VITALS — BP 123/80 | HR 89 | Temp 99.1°F | Ht 64.0 in | Wt 116.4 lb

## 2016-08-12 VITALS — BP 123/80 | HR 89 | Temp 99.1°F | Resp 18 | Ht 64.0 in | Wt 116.4 lb

## 2016-08-12 DIAGNOSIS — R0789 Other chest pain: Secondary | ICD-10-CM | POA: Diagnosis not present

## 2016-08-12 DIAGNOSIS — K8050 Calculus of bile duct without cholangitis or cholecystitis without obstruction: Secondary | ICD-10-CM | POA: Diagnosis not present

## 2016-08-12 DIAGNOSIS — C3431 Malignant neoplasm of lower lobe, right bronchus or lung: Secondary | ICD-10-CM | POA: Diagnosis not present

## 2016-08-12 DIAGNOSIS — Z902 Acquired absence of lung [part of]: Secondary | ICD-10-CM | POA: Diagnosis not present

## 2016-08-12 DIAGNOSIS — R079 Chest pain, unspecified: Secondary | ICD-10-CM | POA: Diagnosis not present

## 2016-08-12 DIAGNOSIS — I700 Atherosclerosis of aorta: Secondary | ICD-10-CM | POA: Insufficient documentation

## 2016-08-12 DIAGNOSIS — J984 Other disorders of lung: Secondary | ICD-10-CM | POA: Insufficient documentation

## 2016-08-12 LAB — THYROID PANEL WITH TSH
Free Thyroxine Index: 3.1 (ref 1.2–4.9)
T3 Uptake Ratio: 33 % (ref 24–39)
T4, Total: 9.3 ug/dL (ref 4.5–12.0)
TSH: 0.124 u[IU]/mL — ABNORMAL LOW (ref 0.450–4.500)

## 2016-08-12 MED ORDER — GABAPENTIN 300 MG PO CAPS: 300.0000 mg | ORAL_CAPSULE | Freq: Three times a day (TID) | ORAL | 0 refills | Status: DC

## 2016-08-12 NOTE — Patient Instructions (Signed)
Please think about what was discussed today, read the information below, and call when you are ready to schedule surgery.   You will most likely be out of work 1-2 weeks for this surgery. You will return after your post-op appointment with a lifting restriction for approximately 4 more weeks.  You will be able to eat anything you would like to following surgery. But, start by eating a bland diet and advance this as tolerated.  Please see the (blue)pre-care form that you have been given today. If you have any questions, please call our office.  Laparoscopic Cholecystectomy Laparoscopic cholecystectomy is surgery to remove the gallbladder. The gallbladder is located in the upper right part of the abdomen, behind the liver. It is a storage sac for bile, which is produced in the liver. Bile aids in the digestion and absorption of fats. Cholecystectomy is often done for inflammation of the gallbladder (cholecystitis). This condition is usually caused by a buildup of gallstones (cholelithiasis) in the gallbladder. Gallstones can block the flow of bile, and that can result in inflammation and pain. In severe cases, emergency surgery may be required. If emergency surgery is not required, you will have time to prepare for the procedure. Laparoscopic surgery is an alternative to open surgery. Laparoscopic surgery has a shorter recovery time. Your common bile duct may also need to be examined during the procedure. If stones are found in the common bile duct, they may be removed. LET [facility] CARE PROVIDER KNOW ABOUT:  Any allergies you have.  All medicines you are taking, including vitamins, herbs, eye drops, creams, and over-the-counter medicines.  Previous problems you or members of your family have had with the use of anesthetics.  Any blood disorders you have.  Previous surgeries you have had.    Any medical conditions you have. RISKS AND COMPLICATIONS Generally, this is a safe procedure.  However, problems may occur, including:  Infection.  Bleeding.  Allergic reactions to medicines.  Damage to other structures or organs.  A stone remaining in the common bile duct.  A bile leak from the cyst duct that is clipped when your gallbladder is removed.  The need to convert to open surgery, which requires a larger incision in the abdomen. This may be necessary if your surgeon thinks that it is not safe to continue with a laparoscopic procedure. BEFORE THE PROCEDURE  Ask your health care provider about:  Changing or stopping your regular medicines. This is especially important if you are taking diabetes medicines or blood thinners.  Taking medicines such as aspirin and ibuprofen. These medicines can thin your blood. Do not take these medicines before your procedure if your health care provider instructs you not to.  Follow instructions from your health care provider about eating or drinking restrictions.  Let your health care provider know if you develop a cold or an infection before surgery.  Plan to have someone take you home after the procedure.  Ask your health care provider how your surgical site will be marked or identified.  You may be given antibiotic medicine to help prevent infection. PROCEDURE  To reduce your risk of infection:  Your health care team will wash or sanitize their hands.  Your skin will be washed with soap.  An IV tube may be inserted into one of your veins.  You will be given a medicine to make you fall asleep (general anesthetic).  A breathing tube will be placed in your mouth.  The surgeon will make several  small cuts (incisions) in your abdomen.  A thin, lighted tube (laparoscope) that has a tiny camera on the end will be inserted through one of the small incisions. The camera on the laparoscope will send a picture to a TV screen (monitor) in the operating room. This will give the surgeon a good view inside your abdomen.  A gas  will be pumped into your abdomen. This will expand your abdomen to give the surgeon more room to perform the surgery.  Other tools that are needed for the procedure will be inserted through the other incisions. The gallbladder will be removed through one of the incisions.  After your gallbladder has been removed, the incisions will be closed with stitches (sutures), staples, or skin glue.  Your incisions may be covered with a bandage (dressing). The procedure may vary among health care providers and hospitals. AFTER THE PROCEDURE  Your blood pressure, heart rate, breathing rate, and blood oxygen level will be monitored often until the medicines you were given have worn off.  You will be given medicines as needed to control your pain.   This information is not intended to replace advice given to you by your health care provider. Make sure you discuss any questions you have with your health care provider.   Document Released: 10/17/2005 Document Revised: 07/08/2015 Document Reviewed: 05/29/2013 Elsevier Interactive Patient Education 2016 [place] Diet for Pancreatitis or Gallbladder Conditions A low-fat diet can be helpful if you have pancreatitis or a gallbladder condition. With these conditions, your pancreas and gallbladder have trouble digesting fats. A healthy eating plan with less fat will help rest your pancreas and gallbladder and reduce your symptoms. WHAT DO I NEED TO KNOW ABOUT THIS DIET?  Eat a low-fat diet.  Reduce your fat intake to less than 20-30% of your total daily calories. This is less than 50-60 g of fat per day.  Remember that you need some fat in your diet. Ask your dietician what your daily goal should be.  Choose nonfat and low-fat healthy foods. Look for the words "nonfat," "low fat," or "fat free."  As a guide, look on the label and choose foods with less than 3 g of fat per serving. Eat only one serving.  Avoid alcohol.  Do not smoke. If  you need help quitting, talk with your health care provider.  Eat small frequent meals instead of three large heavy meals. WHAT FOODS CAN I EAT? Grains Include healthy grains and starches such as potatoes, wheat bread, fiber-rich cereal, and brown rice. Choose whole grain options whenever possible. In adults, whole grains should account for 45-65% of your daily calories.  Fruits and Vegetables Eat plenty of fruits and vegetables. Fresh fruits and vegetables add fiber to your diet. Meats and Other Protein Sources Eat lean meat such as chicken and pork. Trim any fat off of meat before cooking it. Eggs, fish, and beans are other sources of protein. In adults, these foods should account for 10-35% of your daily calories. Dairy Choose low-fat milk and dairy options. Dairy includes fat and protein, as well as calcium.  Fats and Oils Limit high-fat foods such as fried foods, sweets, baked goods, sugary drinks.  Other Creamy sauces and condiments, such as mayonnaise, can add extra fat. Think about whether or not you need to use them, or use smaller amounts or low fat options. WHAT FOODS ARE NOT RECOMMENDED?  High fat foods, such as:  [employer].  Ice cream.  [place]  toast.  Sweet rolls.  Pizza.  Cheese bread.  Foods covered with batter, butter, creamy sauces, or cheese.  Fried foods.  Sugary drinks and desserts.  Foods that cause gas or bloating   This information is not intended to replace advice given to you by your health care provider. Make sure you discuss any questions you have with your health care provider.   Document Released: 10/22/2013 Document Reviewed: 10/22/2013 Elsevier Interactive Patient Education [employer].

## 2016-08-12 NOTE — Progress Notes (Signed)
Surgical H&P  08/12/2016  [name] is an 70 y.o. 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    HPI: She is a patient that is well known to me as she has had previous right lung cancer with a resection that I assisted Dr. Faith Rogue with a few months previously. I have also removed a small cyst from her left hip back in August. The patient states that she has had off and on right upper quadrant and epigastric pain for a number of years. The patient has had ultrasound and a HIDA scan performed in the past back in March 2016. She was concerned because she began having the pain again after she ate some greasy fatty foods and due to her recent lung cancer she was following up with Dr. Faith Rogue for this pain. She had a normal chest x-ray and after reviewing her clinical symptoms she was referred to me. The patient continues to have some intermittent right upper quadrant and epigastric pain that does move through into her back whenever it's happening a can be as bad as a 7 out of 10 pain but usually is a dull 4 out of 10 achy pain. The patient is currently having little pain at this time she sometimes has some pain in her shoulder as well. Patient has had some nausea especially when the pain is bad but no specific vomiting. The patient has not had any symptoms of bloating or increased flatulence and she has not had any diarrhea either. The patient has never had any yellowing of her skin or eyes such as jaundice in the past either. Patient currently denies any fever chills nausea vomiting diarrhea constipation or dysuria.  Past Medical History:  Diagnosis Date  . Cancer of lung ([place]) 10/01/2015  . GERD (gastroesophageal reflux disease)   . Groin pain    Along superior/laterl R inguinal canal.  Could be due to hernia or old scar tissue.  prev with CT done w/o alarming findings.  Will treat episodically.  Use tylenol #3 prn.    . Hyperlipidemia   . Hypertension   . Lipoma of thigh    Left   . Normal cardiac stress test 2014  . PONV (postoperative nausea and vomiting)    nausea after hysterectomy, and 2 days after lung surgery 10/2015  . Thyroid nodule     Past Surgical History:  Procedure Laterality Date  . ABDOMINAL HYSTERECTOMY  12-30-08  . BREAST MASS EXCISION  1977   benign  . CARPAL TUNNEL RELEASE Right 1978  . COLONOSCOPY  2009  . DILATION AND CURETTAGE OF UTERUS  2001  . ELECTROMAGNETIC NAVIGATION BROCHOSCOPY N/A 08/25/2015   Procedure: ELECTROMAGNETIC NAVIGATION BRONCHOSCOPY;  Surgeon: Flora Lipps, MD;  Location: [facility];  Service: Cardiopulmonary;  Laterality: N/A;  . ENDOBRONCHIAL ULTRASOUND N/A 08/25/2015   Procedure: ENDOBRONCHIAL ULTRASOUND;  Surgeon: Flora Lipps, MD;  Location: [facility];  Service: Cardiopulmonary;  Laterality: N/A;  . ESOPHAGOGASTRODUODENOSCOPY  2002, 11/15/07   Normal (Dr. Allyn Kenner)  . FACIAL COSMETIC SURGERY  01/13/10   mini facelift  . HERNIA REPAIR  1976  . THORACOTOMY/LOBECTOMY Right 10/27/2015   Procedure: THORACOTOMY/LOBECTOMY;  Surgeon: Nestor Lewandowsky, MD;  Location: [facility];  Service: Thoracic;  Laterality: Right;  . THYROIDECTOMY N/A 12/29/2015   Procedure: THYROIDECTOMY;  Surgeon: Beverly Gust, MD;  Location: [facility];  Service: ENT;  Laterality: N/A;  . TOTAL VAGINAL HYSTERECTOMY  12/30/08   for fibroid pain (Dr. Gertie Fey)    Family History  Problem Relation Age of Onset  . Transient ischemic attack Mother   . Hypertension Mother   . Stroke Mother     mini strokes  . Hypertension Sister   . Cancer Sister     ovarian  . Hypertension Sister   . Hypertension Sister   . Hypertension Sister   . Breast cancer Neg Hx   . Colon cancer Neg Hx     Social History:  reports that she has never smoked. She has never used smokeless tobacco. She reports that she does not drink alcohol or use drugs.  Allergies:  Allergies  Allergen Reactions  . Atorvastatin     REACTION: myalgias  . Ciprofloxacin     REACTION: increase reflux   . Epinephrine     Heart racing with dental injection  . Metoprolol Succinate Swelling    Swelling and stiffness in hands and ankles  . Simvastatin Other (See Comments)    Muscle aches and stiffness  . Sulfonamide Derivatives     REACTION: itching    Medications reviewed.  Review of Systems  Constitutional: Negative for chills, diaphoresis, fever and weight loss.  HENT: Negative for congestion.   Respiratory: Negative for cough, sputum production, shortness of breath and wheezing.   Cardiovascular: Negative for chest pain, palpitations, claudication and leg swelling.  Gastrointestinal: Positive for abdominal pain and nausea. Negative for blood in stool, constipation, diarrhea, heartburn and vomiting.  Genitourinary: Negative for dysuria and frequency.  Musculoskeletal: Negative for back pain and myalgias.  Skin: Negative for itching and rash.  Neurological: Negative for dizziness, loss of consciousness, weakness and headaches.  Psychiatric/Behavioral: Negative for depression. The patient is not nervous/anxious.   All other systems reviewed and are negative.    Physical Exam:  BP 123/80   Pulse 89   Temp 99.1 F (37.3 C) (Oral)   Ht _0  (1.626 m)   Wt 116 lb 6.4 oz (52.8 kg)   BMI 19.98 kg/m   Physical Exam  Constitutional: She appears well-developed and well-nourished. No distress.  HENT:  Head: Normocephalic and atraumatic.  Right Ear: External ear normal.  Left Ear: External ear normal.  Nose: Nose normal.  Mouth/Throat: Oropharynx is clear and moist. No oropharyngeal exudate.  Eyes: Conjunctivae and EOM are normal. Pupils are equal, round, and reactive to light. No scleral icterus.  Neck: Normal range of motion. Neck supple. No tracheal deviation present.  Cardiovascular: Normal rate, regular rhythm, normal heart sounds and intact distal pulses.  Exam reveals no gallop and no friction rub.   No murmur heard. Pulmonary/Chest: Effort normal and breath sounds  normal. No respiratory distress. She has no wheezes. She has no rales.  Abdominal: Soft. Bowel sounds are normal. She exhibits no distension. There is tenderness. There is no rebound and no guarding.  Epigastric and RUQ tenderness, no cva tenderness  Musculoskeletal: Normal range of motion. She exhibits no edema, tenderness or deformity.  Neurological: She is alert. No cranial nerve deficit.  Skin: Skin is warm and dry. No rash noted. No erythema. No pallor.  Psychiatric: She has a normal mood and affect. Her behavior is normal. Judgment and thought content normal.  Vitals reviewed.   Results for orders placed or performed in visit on 08/11/16 (from the past 48 hour(s))  CBC with Differential/Platelet     Status: Abnormal   Collection Time: 08/11/16  9:55 AM  Result Value Ref Range   WBC 6.6 3.6 - 11.0 K/uL   RBC 4.52 3.80 - 5.20 MIL/uL  Hemoglobin 13.5 12.0 - 16.0 g/dL   HCT 39.9 35.0 - 47.0 %   MCV 88.2 80.0 - 100.0 fL   MCH 30.0 26.0 - 34.0 pg   MCHC 34.0 32.0 - 36.0 g/dL   RDW 14.8 (H) 11.5 - 14.5 %   Platelets 124 (L) 150 - 440 K/uL   Neutrophils Relative % 65 %   Neutro Abs 4.3 1.4 - 6.5 K/uL   Lymphocytes Relative 25 %   Lymphs Abs 1.7 1.0 - 3.6 K/uL   Monocytes Relative 8 %   Monocytes Absolute 0.5 0.2 - 0.9 K/uL   Eosinophils Relative 1 %   Eosinophils Absolute 0.1 0 - 0.7 K/uL   Basophils Relative 1 %   Basophils Absolute 0.1 0 - 0.1 K/uL  Comprehensive metabolic panel     Status: Abnormal   Collection Time: 08/11/16  9:55 AM  Result Value Ref Range   Sodium 137 135 - 145 mmol/L   Potassium 3.9 3.5 - 5.1 mmol/L   Chloride 105 101 - 111 mmol/L   CO2 25 22 - 32 mmol/L   Glucose, Bld 120 (H) 65 - 99 mg/dL   BUN 17 6 - 20 mg/dL   Creatinine, Ser 0.62 0.44 - 1.00 mg/dL   Calcium 8.7 (L) 8.9 - 10.3 mg/dL   Total Protein 6.3 (L) 6.5 - 8.1 g/dL   Albumin 3.8 3.5 - 5.0 g/dL   AST 23 15 - 41 U/L   ALT 21 14 - 54 U/L   Alkaline Phosphatase 42 38 - 126 U/L   Total  Bilirubin 1.0 0.3 - 1.2 mg/dL   GFR calc non Af Amer >60 >60 mL/min   GFR calc Af Amer >60 >60 mL/min    Comment: (NOTE) The eGFR has been calculated using the CKD EPI equation. This calculation has not been validated in all clinical situations. eGFR's persistently <60 mL/min signify possible Chronic Kidney Disease.    Anion gap 7 5 - 15  Thyroid Panel With TSH     Status: Abnormal   Collection Time: 08/11/16  9:55 AM  Result Value Ref Range   TSH 0.124 (L) 0.450 - 4.500 uIU/mL   T4, Total 9.3 4.5 - 12.0 ug/dL   T3 Uptake Ratio 33 24 - 39 %   Free Thyroxine Index 3.1 1.2 - 4.9    Comment: (NOTE) Performed At: [facility] [place], [place] [id number] Lindon Romp MD WU:[id number]    Dg Chest 2 View  Result Date: 08/12/2016 CLINICAL DATA:  History of right lower lobe lung cancer status post right lower lobectomy. Right chest pain. EXAM: CHEST  2 VIEW COMPARISON:  02/04/2016 chest radiograph. FINDINGS: Stable cardiomediastinal silhouette with normal heart size and aortic atherosclerosis. No pneumothorax. No pleural effusion. Stable volume loss in the right hemithorax status post right lower lobectomy with elevation of the right hemidiaphragm. Stable mild scarring at the right lung base. No pulmonary edema. No acute consolidative airspace disease. Mild thoracic spondylosis. IMPRESSION: 1. Stable chest radiograph status post right lower lobectomy with mild right lung base scarring and no acute cardiopulmonary disease. 2. Aortic atherosclerosis. Electronically Signed   By: Ilona Sorrel M.D.   On: 08/12/2016 09:03    12/2014:  HIDA:  Pain with CCK.  Gallbladder EF is 65%.    Assessment/Plan: [name] is an 70 y.o. female seen for the diagnosis of Recurrent biliary colic [N56.21].  I have personally reviewed the patient's past medical history and I do know her  well with her current chronic issues. I have also reviewed her pain and previous workup for the  gallbladder. I reviewed her laboratory values which are within normal limits and do not show any elevation in her white blood cell count or in her LFTs. I also personally reviewed her x-ray images which show some scar tissue in the right lung however otherwise normal long. I have also personally reviewed her HIDA scan images which do show good ejection fraction of 65%. However she did have pain with the CCK injection. I have reviewed the radiology reads as well.  Studies have shown that with the CCK injection if the pain is reproducible that about 95% of this patient's will benefit from having her gallbladder removed and that that will stop her biliary colic pains. Given the patient's clinical picture I do believe this is biliary colic and might be suffering from partially a hyperactive gallbladder. I discussed with the patient that she would be a candidate to have her gallbladder removed. Her husband had a stroke a few years back and she would like to discuss with him if she has to get a caregiver available for him and her ride for herself.  The risks, benefits, complications, treatment options, and expected outcomes were discussed with the patient. The possibilities of bleeding, recurrent infection, finding a normal gallbladder, perforation of viscus organs, damage to surrounding structures, bile leak, abscess formation, needing a drain placed, the need for additional procedures, reaction to medication, pulmonary aspiration,  failure to diagnose a condition, the possible need to convert to an open procedure, and creating a complication requiring transfusion or operation were discussed with the patient. The patient did like to discuss with her family and she will give [place] a call back to let us know if she would like the surgery or she will call and schedule an appointment for a few weeks to further discuss her options.Karen Mound Jazalynn Mireles MD General Surgeon  08/12/2016,4:50 PM

## 2016-08-12 NOTE — Progress Notes (Signed)
  Patient ID: [name], female   DOB: 1946/09/24, 70 y.o.   MRN: [id number]  HISTORY: This patient is now 10 months out from a right thoracotomy and right lower lobectomy. She was doing well until the last several weeks when she began experiencing right upper quadrant pain. She states this radiates around to her back and to her spine. Although she has had this episodic it appears to be increasing in frequency and occurring several times per day. It is often associated with eating. However it may occur at other times as well. She's had no nausea or vomiting. She has had no shortness of breath. She did have a chest x-ray made today which shows no untoward findings.   Vitals:   08/12/16 0935  BP: 123/80  Pulse: 89  Resp: 18  Temp: 99.1 F (37.3 C)     EXAM:    Resp: Lungs are clear bilaterally.  No respiratory distress, normal effort. Heart:  Regular without murmurs Abd:  Abdomen is soft, non distended and non tender. No masses are palpable.  There is no rebound and no guarding.  Neurological: Alert and oriented to person, place, and time. Coordination normal.  Skin: Skin is warm and dry. No rash noted. No diaphoretic. No erythema. No pallor.  Psychiatric: Normal mood and affect. Normal behavior. Judgment and thought content normal.    ASSESSMENT: Her physical exam is essentially unremarkable as is her chest x-ray. I do not see any obvious cause for her right chest wall pain which has begun in the last several weeks. I did review the results of her right upper quadrant ultrasound and HIDDA scan.   PLAN:   I will ask Dr. Octavia Heir to reevaluate the patient and discuss potential causes of her abdominal pain. Of interest is that she did have pain with the administration of CCK. She is unclear if this pain is similar to that type of pain. Will ask Dr. Heath Lark to see the patient and she will follow-up with Dr. Jacinto Reap in oncology    Nestor Lewandowsky, MD

## 2016-08-12 NOTE — Patient Instructions (Addendum)
We will have you see Dr. Azalee Course today for your Gallbladder concerns.

## 2016-08-15 LAB — THYROGLOBULIN LEVEL: Thyroglobulin: 12 ng/mL

## 2016-08-18 ENCOUNTER — Encounter: Payer: Self-pay | Admitting: Internal Medicine

## 2016-08-18 ENCOUNTER — Inpatient Hospital Stay (HOSPITAL_BASED_OUTPATIENT_CLINIC_OR_DEPARTMENT_OTHER): Payer: PPO | Admitting: Internal Medicine

## 2016-08-18 VITALS — BP 136/78 | HR 95 | Temp 96.8°F | Resp 17 | Ht 64.0 in | Wt 116.6 lb

## 2016-08-18 DIAGNOSIS — H10021 Other mucopurulent conjunctivitis, right eye: Secondary | ICD-10-CM | POA: Diagnosis not present

## 2016-08-18 DIAGNOSIS — M8580 Other specified disorders of bone density and structure, unspecified site: Secondary | ICD-10-CM | POA: Diagnosis not present

## 2016-08-18 DIAGNOSIS — Z79899 Other long term (current) drug therapy: Secondary | ICD-10-CM

## 2016-08-18 DIAGNOSIS — C73 Malignant neoplasm of thyroid gland: Secondary | ICD-10-CM | POA: Diagnosis not present

## 2016-08-18 DIAGNOSIS — R1011 Right upper quadrant pain: Secondary | ICD-10-CM

## 2016-08-18 DIAGNOSIS — C3431 Malignant neoplasm of lower lobe, right bronchus or lung: Secondary | ICD-10-CM

## 2016-08-18 NOTE — Progress Notes (Signed)
Pt has new right pain under rib cage and is concerned about lung.  Chest x-ray done last Friday.

## 2016-08-18 NOTE — Assessment & Plan Note (Addendum)
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 Chest x-ray reviewed; no clinical concerns for recurrence.  Recommend a chest CT in 3 months  # RUQ pain- ? Surgical vs. Gall bladder problems. Defer to surgery.   # Thyroid Ca - on Synthroid 100 mcg /day; currently on 0.12. Continue current dose of synthroid.    # osteopenia/ hypocalcemia- vit D+ ca BID; exercise.   # follow up in 3 months/labs;Throid prolife/ CT scan prior.

## 2016-08-18 NOTE — Progress Notes (Signed)
[place] OFFICE PROGRESS NOTE  Patient Care Team: Tonia Ghent, MD as PCP - General (Family Medicine) Beverly Gust, MD (Unknown Physician Specialty) Nestor Lewandowsky, MD as Referring Physician (Cardiothoracic Surgery) Crista Curb. Gloriann Loan, MD as Referring Physician (Ophthalmology) Cammie Sickle, MD as Consulting Physician (Internal Medicine) Hubbard Robinson, MD as Consulting Physician (Surgery)  Cancer of lung [facility])   Staging form: Lung, AJCC 7th Edition   - Clinical: T1, N0, M0 - Signed by Forest Gleason, MD on 10/01/2015  Thyroid cancer [facility])   Staging form: Thyroid - Papillary or Follicular (Under 45 years), AJCC 7th Edition   - Clinical: Stage I (T1, N0, M0) - Signed by Forest Gleason, MD on 09/11/2015   Oncology History   # NOV 2016- RLL [place] ca T1N0 [incidental; Dr.Simonds; Bronc- s/p RLlobectomy; Dr.Oaks;Dec 2016]  # FEB 2017- 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 STAGE I; s/p Total Thyroidetcomy [Dr.Chapman]; Synthroid     Cancer of lung ([place])   10/01/2015 Initial Diagnosis    Cancer of lung ([place])       Cancer of lower lobe of right lung ([place])   05/06/2016 Initial Diagnosis    Malignant neoplasm of lower lobe, right bronchus or lung ([place])       INTERVAL HISTORY:  [name] 70 y.o.  female pleasant patient above history of stage I lung cancer status post lobectomy; m and also history of thyroid cancer status post thyroidectomy currently on Synthroid is here for follow-up.  Patient has been complaining of right chest wall pain at the site of her previous lobectomy right lower; had a chest x-ray. She also told to have possible gallbladder problem. She has been evaluated by surgery./Dr.Loflin.   Patient complains of mild fatigue. She continues to be on Synthroid 100  She denies any palpitations. Denies any chest pain. There is shortness of breath or cough or swelling in the legs.    REVIEW OF SYSTEMS:  A complete 10 point review of system is done which is negative except mentioned above/history of present illness.   PAST MEDICAL HISTORY :  Past Medical History:  Diagnosis Date  . Cancer of lung ([place]) 10/01/2015  . GERD (gastroesophageal reflux disease)   . Groin pain    Along superior/laterl R inguinal canal.  Could be due to hernia or old scar tissue.  prev with CT done w/o alarming findings.  Will treat episodically.  Use tylenol #3 prn.    . Hyperlipidemia   . Hypertension   . Lipoma of thigh    Left  . Normal cardiac stress test 2014  . PONV (postoperative nausea and vomiting)    nausea after hysterectomy, and 2 days after lung surgery 10/2015  . Thyroid nodule     PAST SURGICAL HISTORY :   Past Surgical History:  Procedure Laterality Date  . ABDOMINAL HYSTERECTOMY  12-30-08  . BREAST MASS EXCISION  1977   benign  . CARPAL TUNNEL RELEASE Right 1978  . COLONOSCOPY  2009  . DILATION AND CURETTAGE OF UTERUS  2001  . ELECTROMAGNETIC NAVIGATION BROCHOSCOPY N/A 08/25/2015   Procedure: ELECTROMAGNETIC NAVIGATION BRONCHOSCOPY;  Surgeon: Flora Lipps, MD;  Location: [facility] ORS;  Service: Cardiopulmonary;  Laterality: N/A;  . ENDOBRONCHIAL ULTRASOUND N/A 08/25/2015   Procedure: ENDOBRONCHIAL ULTRASOUND;  Surgeon: Flora Lipps, MD;  Location: [facility] ORS;  Service: Cardiopulmonary;  Laterality: N/A;  . ESOPHAGOGASTRODUODENOSCOPY  2002, 11/15/07   Normal (Dr. Allyn Kenner)  .  FACIAL COSMETIC SURGERY  01/13/10   mini facelift  . HERNIA REPAIR  1976  . THORACOTOMY/LOBECTOMY Right 10/27/2015   Procedure: THORACOTOMY/LOBECTOMY;  Surgeon: Nestor Lewandowsky, MD;  Location: [facility] ORS;  Service: Thoracic;  Laterality: Right;  . THYROIDECTOMY N/A 12/29/2015   Procedure: THYROIDECTOMY;  Surgeon: Beverly Gust, MD;  Location: [facility] ORS;  Service: ENT;  Laterality: N/A;  . TOTAL VAGINAL HYSTERECTOMY  12/30/08   for fibroid pain (Dr. Gertie Fey)    FAMILY HISTORY :   Family History  Problem  Relation Age of Onset  . Transient ischemic attack Mother   . Hypertension Mother   . Stroke Mother     mini strokes  . Hypertension Sister   . Cancer Sister     ovarian  . Hypertension Sister   . Hypertension Sister   . Hypertension Sister   . Breast cancer Neg Hx   . Colon cancer Neg Hx     SOCIAL HISTORY:   Social History  Substance Use Topics  . Smoking status: Never Smoker  . Smokeless tobacco: Never Used  . Alcohol use No    ALLERGIES:  is allergic to atorvastatin; ciprofloxacin; epinephrine; metoprolol succinate; simvastatin; and sulfonamide derivatives.  MEDICATIONS:  Current Outpatient Prescriptions  Medication Sig Dispense Refill  . Ascorbic Acid (VITAMIN C) 500 MG tablet Take 1,000 mg by mouth every morning. Reported on 02/04/2016    . Biotin (PA BIOTIN) 1000 MCG tablet Take 1,000 mcg by mouth every morning. Reported on 02/04/2016    . Calcium-Vitamin D-Vitamin K (CALCIUM + D + K PO) Take 1 tablet by mouth daily.    . Cholecalciferol (VITAMIN D-3) 1000 units CAPS Take 1 capsule by mouth daily.    . Coenzyme Q10 (COQ10) 50 MG CAPS Take 1 capsule by mouth every evening. Reported on 02/04/2016    . Folic Acid-Vit K2-IOX B35 (FA-VITAMIN B-6-VITAMIN B-12 PO) 1 tablet daily after breakfast. Reported on 02/04/2016    . levothyroxine (SYNTHROID, LEVOTHROID) 100 MCG tablet TAKE 1 TABLET (100 MCG TOTAL) BY MOUTH DAILY BEFORE BREAKFAST. 30 tablet 3  . Omega-3 Fatty Acids (FISH OIL) 1200 MG CAPS Take 1 capsule by mouth daily.    Marland Kitchen omeprazole (PRILOSEC) 40 MG capsule Take 1 capsule (40 mg total) by mouth daily. 90 capsule 3  . Potassium Gluconate 595 MG CAPS Take 1 capsule by mouth daily.    . pravastatin (PRAVACHOL) 40 MG tablet Take 1 tablet (40 mg total) by mouth daily. 90 tablet 3  . meclizine (ANTIVERT) 25 MG tablet Take 1 tablet (25 mg total) by mouth 3 (three) times daily as needed for dizziness. (Patient not taking: Reported on 08/18/2016) 30 tablet 0   No current  facility-administered medications for this visit.     PHYSICAL EXAMINATION: ECOG PERFORMANCE STATUS: 0 - Asymptomatic  BP 136/78 (BP Location: Left Arm, Patient Position: Sitting)   Pulse 95   Temp (!) 96.8 F (36 C) (Tympanic)   Resp 17   Ht '5\' 4"'$  (1.626 m)   Wt 116 lb 9.6 oz (52.9 kg)   SpO2 100%   BMI 20.01 kg/m   Filed Weights   08/18/16 1019  Weight: 116 lb 9.6 oz (52.9 kg)    GENERAL: Well-nourished well-developed; Alert, no distress and comfortable.   Accompanied by husband. EYES: no pallor or icterus OROPHARYNX: no thrush or ulceration; good dentition  NECK: supple, no masses felt LYMPH:  no palpable lymphadenopathy in the cervical, axillary or inguinal regions LUNGS: clear to auscultation and  No wheeze or crackles HEART/CVS: regular rate & rhythm and no murmurs; No lower extremity edema ABDOMEN:abdomen soft, non-tender and normal bowel sounds Musculoskeletal:no cyanosis of digits and no clubbing  PSYCH: alert & oriented x 3 with fluent speech NEURO: no focal motor/sensory deficits SKIN:  no rashes or significant lesions  LABORATORY DATA:  I have reviewed the data as listed    Component Value Date/Time   NA 137 08/11/2016 0955   NA 141 07/01/2013 0103   K 3.9 08/11/2016 0955   K 3.8 07/01/2013 0103   CL 105 08/11/2016 0955   CL 110 (H) 07/01/2013 0103   CO2 25 08/11/2016 0955   CO2 27 07/01/2013 0103   GLUCOSE 120 (H) 08/11/2016 0955   GLUCOSE 88 07/01/2013 0103   BUN 17 08/11/2016 0955   BUN 11 07/01/2013 0103   CREATININE 0.62 08/11/2016 0955   CREATININE 0.68 07/01/2013 0103   CALCIUM 8.7 (L) 08/11/2016 0955   CALCIUM 8.2 (L) 07/01/2013 0103   PROT 6.3 (L) 08/11/2016 0955   PROT 5.5 (L) 07/01/2013 0103   ALBUMIN 3.8 08/11/2016 0955   ALBUMIN 3.0 (L) 07/01/2013 0103   AST 23 08/11/2016 0955   AST 19 07/01/2013 0103   ALT 21 08/11/2016 0955   ALT 24 07/01/2013 0103   ALKPHOS 42 08/11/2016 0955   ALKPHOS 55 07/01/2013 0103   BILITOT 1.0  08/11/2016 0955   BILITOT 0.7 07/01/2013 0103   GFRNONAA >60 08/11/2016 0955   GFRNONAA >60 07/01/2013 0103   GFRAA >60 08/11/2016 0955   GFRAA >60 07/01/2013 0103    No results found for: SPEP, UPEP  Lab Results  Component Value Date   WBC 6.6 08/11/2016   NEUTROABS 4.3 08/11/2016   HGB 13.5 08/11/2016   HCT 39.9 08/11/2016   MCV 88.2 08/11/2016   PLT 124 (L) 08/11/2016      Chemistry      Component Value Date/Time   NA 137 08/11/2016 0955   NA 141 07/01/2013 0103   K 3.9 08/11/2016 0955   K 3.8 07/01/2013 0103   CL 105 08/11/2016 0955   CL 110 (H) 07/01/2013 0103   CO2 25 08/11/2016 0955   CO2 27 07/01/2013 0103   BUN 17 08/11/2016 0955   BUN 11 07/01/2013 0103   CREATININE 0.62 08/11/2016 0955   CREATININE 0.68 07/01/2013 0103      Component Value Date/Time   CALCIUM 8.7 (L) 08/11/2016 0955   CALCIUM 8.2 (L) 07/01/2013 0103   ALKPHOS 42 08/11/2016 0955   ALKPHOS 55 07/01/2013 0103   AST 23 08/11/2016 0955   AST 19 07/01/2013 0103   ALT 21 08/11/2016 0955   ALT 24 07/01/2013 0103   BILITOT 1.0 08/11/2016 0955   BILITOT 0.7 07/01/2013 0103       RADIOGRAPHIC STUDIES: I have personally reviewed the radiological images as listed and agreed with the findings in the report. No results found.   ASSESSMENT & PLAN:  Cancer of lower lobe of right lung ([place]) # RLL Lung ca; stage I no adjuvant therapy. Recommend a chest CT in 3 months  # RUQ pain- ? Surgical vs. Gall bladder problems. Defer to surgery.   # Thyroid Ca - on Synthroid 100 mcg /day; currently on 0.12. Continue current dose of synthroid.    # osteopenia/ hypocalcemia- vit D+ ca BID; exercise.   # follow up in 3 months/labs;Throid prolife/ CT scan prior.    Orders Placed This Encounter  Procedures  . CT CHEST W  CONTRAST    Standing Status:   Future    Standing Expiration Date:   10/18/2017    Order Specific Question:   Reason for Exam (SYMPTOM  OR DIAGNOSIS REQUIRED)    Answer:   lung cancer     Order Specific Question:   Preferred imaging location?    Answer:   [facility]   All questions were answered. The patient knows to call the clinic with any problems, questions or concerns.      Cammie Sickle, MD 08/18/2016 1:58 PM

## 2016-09-09 ENCOUNTER — Encounter: Payer: Self-pay | Admitting: Family Medicine

## 2016-09-09 ENCOUNTER — Ambulatory Visit (INDEPENDENT_AMBULATORY_CARE_PROVIDER_SITE_OTHER): Payer: PPO | Admitting: Family Medicine

## 2016-09-09 DIAGNOSIS — R1011 Right upper quadrant pain: Secondary | ICD-10-CM | POA: Diagnosis not present

## 2016-09-09 NOTE — Progress Notes (Signed)
Pre visit review using our clinic review tool, if applicable. No additional management support is needed unless otherwise documented below in the visit note. 

## 2016-09-09 NOTE — Patient Instructions (Signed)
I think it is very likely that you have gall bladder symptoms. I would talk to surgery and get their input.  Take care.  Glad to see you.

## 2016-09-09 NOTE — Progress Notes (Signed)
She has been having RUQ pain vs R rib pain.  She saw Dr Genevive Bi.  CXR wasn't remarkable.  The thought was that she could have possible GB source for discomfort.  Intermittent pain.  More pain after eating.  Some burning in the upper abdomen.  Some pain between the shoulder blades.    This has been going on at least intermittently for 1.5 years.  Prev HIDA with pain on CCK administration.    Meds, vitals, and allergies reviewed.   ROS: Per HPI unless specifically indicated in ROS section   GEN: nad, alert and oriented NECK: supple w/o LA CV: rrr.  PULM: ctab, no inc wob ABD: soft, +bs, RUQ slightly ttp, abd not ttp o/w.  No rebound EXT: no edema SKIN: no acute rash, healed surgical scars notd.

## 2016-09-09 NOTE — Assessment & Plan Note (Signed)
Likely to be GB dysfunction given hx, duration, and prev HIDA with pain on CCK administration.   D/w pt.  Would have her check with surgery about intervention.  D/w pt.  She agrees.

## 2016-09-26 ENCOUNTER — Encounter: Payer: Self-pay | Admitting: Surgery

## 2016-09-26 ENCOUNTER — Ambulatory Visit (INDEPENDENT_AMBULATORY_CARE_PROVIDER_SITE_OTHER): Payer: PPO | Admitting: Surgery

## 2016-09-26 VITALS — BP 141/73 | HR 93 | Temp 97.5°F | Ht 64.0 in | Wt 116.5 lb

## 2016-09-26 DIAGNOSIS — K8050 Calculus of bile duct without cholangitis or cholecystitis without obstruction: Secondary | ICD-10-CM | POA: Diagnosis not present

## 2016-09-26 DIAGNOSIS — R1011 Right upper quadrant pain: Secondary | ICD-10-CM | POA: Diagnosis not present

## 2016-09-26 NOTE — Patient Instructions (Signed)
Please call our office when you decide to have your surgery. Please see your blue pre-care sheet for surgery information.

## 2016-09-26 NOTE — Progress Notes (Signed)
Subjective:     Patient ID: [name], female   DOB: 1946-07-07, 70 y.o.   MRN: [id number]  HPI   70 year old female is well-known to the surgery service for right lung mass as well as a left thigh sebaceous cyst removal within the past year comes in today with complaint of right upper quadrant pressure and pain. The patient and I discussed this previously and she again reiterates that she has had this pain off and on for years. The patient states that after she eats she will have a pressure in her right upper abdomen just below the rib cage that will be either a pain or pressure and will require her to lay down or lay sideways in order to relieve the pressure. Patient has also noticed some bloating and some pain between her shoulders and pain in a bandlike area around her epigastric area. The patient states that the pain will come and go in waves and then it will gradually relieve after eating. Patient states that this is worse with fattier foods and sometimes it will come on and sometimes it won't. The patient has seen physicians for this in the past however with getting diagnosis and her thyroid as well as her long over the past year she has not revisited this since March 2016. Patient is currently still having these type symptoms weekly and sometimes they will last for several days when they come on. The patient currently denies any fever chills nausea vomiting abdominal bloating or pain diarrhea or constipation.  Past Medical History:  Diagnosis Date  . Cancer of lung ([place]) 10/01/2015  . GERD (gastroesophageal reflux disease)   . Groin pain    Along superior/laterl R inguinal canal.  Could be due to hernia or old scar tissue.  prev with CT done w/o alarming findings.  Will treat episodically.  Use tylenol #3 prn.    . Hyperlipidemia   . Hypertension   . Lipoma of thigh    Left  . Normal cardiac stress test 2014  . PONV (postoperative nausea and vomiting)    nausea after hysterectomy, and 2  days after lung surgery 10/2015  . Thyroid nodule    Past Surgical History:  Procedure Laterality Date  . ABDOMINAL HYSTERECTOMY  12-30-08  . BREAST MASS EXCISION  1977   benign  . CARPAL TUNNEL RELEASE Right 1978  . COLONOSCOPY  2009  . DILATION AND CURETTAGE OF UTERUS  2001  . ELECTROMAGNETIC NAVIGATION BROCHOSCOPY N/A 08/25/2015   Procedure: ELECTROMAGNETIC NAVIGATION BRONCHOSCOPY;  Surgeon: Flora Lipps, MD;  Location: [facility] ORS;  Service: Cardiopulmonary;  Laterality: N/A;  . ENDOBRONCHIAL ULTRASOUND N/A 08/25/2015   Procedure: ENDOBRONCHIAL ULTRASOUND;  Surgeon: Flora Lipps, MD;  Location: [facility] OR[facility];  Service: Cardiopulmonary;  Laterality: N/A;  . ESOPHAGOGASTRODUODENOSCOPY  2002, 11/15/07   Normal (Dr. Allyn Kenner)  . FACIAL COSMETIC SURGERY  01/13/10   mini facelift  . HERNIA REPAIR  1976  . THORACOTOMY/LOBECTOMY Right 10/27/2015   Procedure: THORACOTOMY/LOBECTOMY;  Surgeon: Nestor Lewandowsky, MD;  Location: [facility] OR[facility];  Service: Thoracic;  Laterality: Right;  . THYROIDECTOMY N/A 12/29/2015   Procedure: THYROIDECTOMY;  Surgeon: Beverly Gust, MD;  Location: [facility] OR[facility];  Service: ENT;  Laterality: N/A;  . TOTAL VAGINAL HYSTERECTOMY  12/30/08   for fibroid pain (Dr. Gertie Fey)   Family History  Problem Relation Age of Onset  . Transient ischemic attack Mother   . Hypertension Mother   . Stroke Mother     mini strokes  . Hypertension Sister   .  Cancer Sister     ovarian  . Hypertension Sister   . Hypertension Sister   . Hypertension Sister   . Breast cancer Neg Hx   . Colon cancer Neg Hx    Social History   Social History  . Marital status: Married    Spouse name: N/A  . Number of children: N/A  . Years of education: N/A   Occupational History  . 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 Smoking status: Never Smoker  . Smokeless tobacco: Never Used  . Alcohol use No  . Drug use: No  . Sexual activity: No   Other Topics Concern  . None   Social History  Narrative   Married 1963 and lives with husband (he had a CVA 11/12, to SNF and then home as of 12/2011)   Retired from medical office 2013.    Current Outpatient Prescriptions:  .  Ascorbic Acid (VITAMIN C) 500 MG tablet, Take 1,000 mg by mouth every morning. Reported on 02/04/2016, Disp: , Rfl:  .  Biotin (PA BIOTIN) 1000 MCG tablet, Take 1,000 mcg by mouth every morning. Reported on 02/04/2016, Disp: , Rfl:  .  Calcium-Vitamin D-Vitamin K (CALCIUM + D + K PO), Take 1 tablet by mouth daily., Disp: , Rfl:  .  Cholecalciferol (VITAMIN D-3) 1000 units CAPS, Take 1 capsule by mouth daily., Disp: , Rfl:  .  Coenzyme Q10 (COQ10) 50 MG CAPS, Take 1 capsule by mouth every evening. Reported on 02/04/2016, Disp: , Rfl:  .  Folic Acid-Vit N3-IRW E31 (FA-VITAMIN B-6-VITAMIN B-12 PO), 1 tablet daily after breakfast. Reported on 02/04/2016, Disp: , Rfl:  .  levothyroxine (SYNTHROID, LEVOTHROID) 100 MCG tablet, TAKE 1 TABLET (100 MCG TOTAL) BY MOUTH DAILY BEFORE BREAKFAST., Disp: 30 tablet, Rfl: 3 .  Omega-3 Fatty Acids (FISH OIL) 1200 MG CAPS, Take 1 capsule by mouth daily., Disp: , Rfl:  .  omeprazole (PRILOSEC) 40 MG capsule, Take 1 capsule (40 mg total) by mouth daily., Disp: 90 capsule, Rfl: 3 .  Potassium Gluconate 595 MG CAPS, Take 1 capsule by mouth daily., Disp: , Rfl:  .  pravastatin (PRAVACHOL) 40 MG tablet, Take 1 tablet (40 mg total) by mouth daily., Disp: 90 tablet, Rfl: 3 Allergies  Allergen Reactions  . Atorvastatin     REACTION: myalgias  . Ciprofloxacin     REACTION: increase reflux  . Epinephrine     Heart racing with dental injection  . Metoprolol Succinate Swelling    Swelling and stiffness in hands and ankles  . Simvastatin Other (See Comments)    Muscle aches and stiffness  . Sulfonamide Derivatives     REACTION: itching    Review of Systems  Constitutional: Positive for appetite change. Negative for activity change, chills, fatigue, fever and unexpected weight change.  HENT:  Negative for congestion and sore throat.   Respiratory: Negative for choking, chest tightness, shortness of breath, wheezing and stridor.   Cardiovascular: Negative for chest pain, palpitations and leg swelling.  Gastrointestinal: Positive for abdominal distention and abdominal pain. Negative for blood in stool, constipation, diarrhea, nausea and vomiting.  Genitourinary: Negative for dysuria and hematuria.  Musculoskeletal: Negative for back pain and joint swelling.  Skin: Negative for color change, pallor, rash and wound.  Neurological: Negative for dizziness and weakness.  Hematological: Negative for adenopathy. Does not bruise/bleed easily.  Psychiatric/Behavioral: Negative for agitation. The patient is not nervous/anxious.   All other systems reviewed and are negative.  Vitals:   09/26/16 1332  BP: (!) 141/73  Pulse: 93  Temp: 97.5 F (36.4 C)    Objective:   Physical Exam  Constitutional: She is oriented to person, place, and time. She appears well-developed and well-nourished. No distress.  HENT:  Head: Normocephalic and atraumatic.  Right Ear: External ear normal.  Nose: Nose normal.  Mouth/Throat: Oropharynx is clear and moist. No oropharyngeal exudate.  Eyes: Conjunctivae and EOM are normal. Pupils are equal, round, and reactive to light. No scleral icterus.  Neck: Neck supple. No tracheal deviation present.  Cardiovascular: Normal rate, regular rhythm, normal heart sounds and intact distal pulses.  Exam reveals no gallop and no friction rub.   No murmur heard. Pulmonary/Chest: Effort normal and breath sounds normal. No respiratory distress. She has no wheezes. She has no rales.  Healed surgical scars on the right chest and two small just beneath the right subcostal margin from her chest tubes that are well-healed.  Abdominal: Soft. Bowel sounds are normal. She exhibits no distension and no mass. There is tenderness. There is no rebound and no guarding.  Mild  epigastric and right upper quadrant tenderness, negative Murphy's sign, negative CVA tenderness   Musculoskeletal: Normal range of motion. She exhibits no edema, tenderness or deformity.  Neurological: She is alert and oriented to person, place, and time. No cranial nerve deficit.  Skin: Skin is warm and dry. No rash noted. No erythema. No pallor.  Psychiatric: She has a normal mood and affect. Her behavior is normal. Judgment and thought content normal.  Vitals reviewed.      CBC Latest Ref Rng & Units 08/11/2016 06/17/2016 05/18/2016  WBC 3.6 - 11.0 K/uL 6.6 8.7 5.1  Hemoglobin 12.0 - 16.0 g/dL 13.5 14.5 13.3  Hematocrit 35.0 - 47.0 % 39.9 42.0 38.7  Platelets 150 - 440 K/uL 124(L) 116(L) 120(L)   CMP Latest Ref Rng & Units 08/11/2016 06/17/2016 05/18/2016  Glucose 65 - 99 mg/dL 120(H) 130(H) 93  BUN 6 - 20 mg/dL '17 16 15  '$ Creatinine 0.44 - 1.00 mg/dL 0.62 0.59 0.65  Sodium 135 - 145 mmol/L 137 139 139  Potassium 3.5 - 5.1 mmol/L 3.9 4.2 3.7  Chloride 101 - 111 mmol/L 105 104 104  CO2 22 - 32 mmol/L '25 29 30  '$ Calcium 8.9 - 10.3 mg/dL 8.7(L) 8.8(L) 8.5(L)  Total Protein 6.5 - 8.1 g/dL 6.3(L) 6.8 6.3(L)  Total Bilirubin 0.3 - 1.2 mg/dL 1.0 0.6 0.8  Alkaline Phos 38 - 126 U/L 42 42 42  AST 15 - 41 U/L '23 27 21  '$ ALT 14 - 54 U/L '21 24 21    '$ U/S: FINDINGS:  Gallbladder:  No gallstones or wall thickening visualized. No sonographic Murphy  sign noted.   Common bile duct:  Diameter: 4.5 mm   Liver:  No focal lesion identified. Within normal limits in parenchymal echogenicity.   IMPRESSION:  Negative exam.   Electronically Signed    By: Marcello Moores  Register    On: 12/30/2014 09:38   HIDA Scan:  FINDINGS:  Liver uptake of radiotracer is normal. There is prompt visualization  of gallbladder and small bowel, indicating patency of the cystic and  common bile ducts. A weight based dose, 1.2 mcg, of CCK was  administered intravenously with calculation of the computer  generated  ejection fraction of radiotracer from the gallbladder. The  patient did experience pain with the CCK administration. The  computer generated ejection fraction of radiotracer from the  gallbladder is normal  at 65%, normal greater than 38%.   IMPRESSION:  The patient experienced pain with the CCK administration. The  computer generated ejection fraction of radiotracer from the  gallbladder is normal at 65%. Cystic and common bile ducts are  patent as is evidenced by visualization of gallbladder and small  bowel.    Electronically Signed    By: Lowella Grip III M.D.    On: 01/02/2015 16:21  Assessment:     70 year old female with biliary colic    Plan:     I am quite familiar with this patient and reviewed her past medical history along with her including her right lung cancer diagnosis and thyroid cancer diagnosis the past that she will follow-up with Dr. Rogue Bussing of oncology for repeat CT scan on January 15.  I have reviewed her past laboratory values which had a normal white blood cell count and no elevation of her liver enzymes. I also personally reviewed her ultrasound images which did not show any gallstones or any abnormalities in the liver or bladder. I have also reviewed the images from her HIDA scan which showed good tracer uptake in the liver and good flow through the gallbladder and into the small bowel. I also reviewed the radiology reads which are above and did show pain with CCK administration on the HIDA scan.   I discussed with the patient that given pain with the CCK administration on the HIDA scan gives her about a 95% likelihood that having a laparoscopic cholecystectomy would improve her pain and pressure symptoms that she is having after eating. I discussed with her that given that she doesn't have frank gallstones she likely is not at a high risk for getting acute cholecystitis or acute pancreatitis however she will likely continue to have the symptoms and told  her gallbladder is removed. The risks, benefits, complications, treatment options, and expected outcomes were discussed with the patient. The possibilities of bleeding, recurrent infection, finding a normal gallbladder, perforation of viscus organs, damage to surrounding structures, bile leak, abscess formation, needing a drain placed, the need for additional procedures, reaction to medication, pulmonary aspiration,  failure to diagnose a condition, the possible need to convert to an open procedure, and creating a complication requiring transfusion or operation were discussed with the patient. The patient and husband concurred with the proposed plan.    She would like to complete her CT scan for follow-up of her lung cancer in January and arrange for a ride as her husband cannot drive due to a past stroke. He will call her office if she would like to do this in January or in February and let us know. I did discuss with her that if it's in January since I'm not in town we can go on and set the surgery op. If she is choosing to do it in February I'll have her return and make sure she doesn't have any questions or any changes in her health prior to performing the operation. The patient was in agreement with this plan and we will await her phone call.

## 2016-09-28 ENCOUNTER — Telehealth: Payer: Self-pay | Admitting: Surgery

## 2016-09-28 NOTE — Telephone Encounter (Signed)
Pt advised of pre op date/time and sx date. Sx: 11/17/16 with Dr Lenore Manner cholecystectomy.  Pre op: 11/10/16 between 9-1:00pm--Phone.   Patient made aware to call [phone], between 1-3:00pm the day before surgery, to find out what time to arrive.

## 2016-09-28 NOTE — Addendum Note (Signed)
Addended by: Hubbard Robinson on: 09/28/2016 09:25 AM   Modules accepted: Orders, SmartSet

## 2016-10-13 DIAGNOSIS — H16223 Keratoconjunctivitis sicca, not specified as Sjogren's, bilateral: Secondary | ICD-10-CM | POA: Diagnosis not present

## 2016-10-26 DIAGNOSIS — R42 Dizziness and giddiness: Secondary | ICD-10-CM | POA: Diagnosis not present

## 2016-10-27 DIAGNOSIS — H15001 Unspecified scleritis, right eye: Secondary | ICD-10-CM | POA: Diagnosis not present

## 2016-11-04 DIAGNOSIS — H15001 Unspecified scleritis, right eye: Secondary | ICD-10-CM | POA: Diagnosis not present

## 2016-11-04 DIAGNOSIS — R42 Dizziness and giddiness: Secondary | ICD-10-CM | POA: Diagnosis not present

## 2016-11-09 DIAGNOSIS — R42 Dizziness and giddiness: Secondary | ICD-10-CM | POA: Diagnosis not present

## 2016-11-10 ENCOUNTER — Encounter
Admission: RE | Admit: 2016-11-10 | Discharge: 2016-11-10 | Disposition: A | Discharge status: Home / Self Care | Payer: PPO | Source: Ambulatory Visit | Attending: Surgery | Admitting: Surgery

## 2016-11-10 NOTE — Patient Instructions (Signed)
  Your procedure is scheduled on: 11-17-16 [facility]) Report to Same Day Surgery 2nd floor medical mall [facility] Entrance-take elevator on left to 2nd floor.  Check in with surgery information desk.) To find out your arrival time please call [phone] between 1PM - 3PM on 11-16-16 [facility])  Remember: Instructions that are not followed completely may result in serious medical risk, up to and including death, or upon the discretion of your surgeon and anesthesiologist your surgery may need to be rescheduled.    _x___ 1. Do not eat food or drink liquids after midnight. No gum chewing or hard candies.     __x__ 2. No Alcohol for 24 hours before or after surgery.   __x__3. No Smoking for 24 prior to surgery.   ____  4. Bring all medications with you on the day of surgery if instructed.    __x__ 5. Notify your doctor if there is any change in your medical condition     (cold, fever, infections).     Do not wear jewelry, make-up, hairpins, clips or nail polish.  Do not wear lotions, powders, or perfumes. You may wear deodorant.  Do not shave 48 hours prior to surgery. Men may shave face and neck.  Do not bring valuables to the hospital.    [facility] is not responsible for any belongings or valuables.               Contacts, dentures or bridgework may not be worn into surgery.  Leave your suitcase in the car. After surgery it may be brought to your room.  For patients admitted to the hospital, discharge time is determined by your treatment team.   Patients discharged the day of surgery will not be allowed to drive home.  You will need someone to drive you home and stay with you the night of your procedure.    Please read over the following fact sheets that you were given:   [facility] Preparing for Surgery and or MRSA Information   _x___ Take these medicines the morning of surgery with A SIP OF WATER:    1. LEVOTHYROXINE  2. PRILOSEC (OMEPRAZOLE)  3. ALSO TAKE AN EXTRA  PRILOSEC ON Wednesday NIGHT BEFORE BED (11-16-16)  4.  5.  6.  ____Fleets enema or Magnesium Citrate as directed.   _x___ Use CHG Soap or sage wipes as directed on instruction sheet   ____ Use inhalers on the day of surgery and bring to hospital day of surgery  ____ Stop metformin 2 days prior to surgery    ____ Take 1/2 of usual insulin dose the night before surgery and none on the morning of  surgery.   ____ Stop Aspirin, Coumadin, Pllavix ,Eliquis, Effient, or Pradaxa  x__ Stop Anti-inflammatories such as Advil, Aleve, Ibuprofen, Motrin, Naproxen,          Naprosyn, Goodies powders or aspirin products NOW- Ok to take Tylenol.   _X___ Stop supplements until after surgery-STOP VIT C, VIT B/FOLIC ACID, BIOTIN, CO-Q 10, LUTEIN NOW (PT STOPPED FISH OIL ON 11-05-16)  ____ Bring C-Pap to the hospital.

## 2016-11-11 ENCOUNTER — Inpatient Hospital Stay: Payer: PPO

## 2016-11-11 ENCOUNTER — Other Ambulatory Visit: Payer: Self-pay | Admitting: *Deleted

## 2016-11-11 ENCOUNTER — Inpatient Hospital Stay: Payer: PPO | Attending: Internal Medicine

## 2016-11-11 ENCOUNTER — Ambulatory Visit: Admission: RE | Admit: 2016-11-11 | Payer: PPO | Source: Ambulatory Visit

## 2016-11-11 DIAGNOSIS — I10 Essential (primary) hypertension: Secondary | ICD-10-CM | POA: Diagnosis not present

## 2016-11-11 DIAGNOSIS — E785 Hyperlipidemia, unspecified: Secondary | ICD-10-CM | POA: Insufficient documentation

## 2016-11-11 DIAGNOSIS — C73 Malignant neoplasm of thyroid gland: Secondary | ICD-10-CM | POA: Insufficient documentation

## 2016-11-11 DIAGNOSIS — M8580 Other specified disorders of bone density and structure, unspecified site: Secondary | ICD-10-CM | POA: Diagnosis not present

## 2016-11-11 DIAGNOSIS — Z79899 Other long term (current) drug therapy: Secondary | ICD-10-CM | POA: Diagnosis not present

## 2016-11-11 DIAGNOSIS — C3431 Malignant neoplasm of lower lobe, right bronchus or lung: Secondary | ICD-10-CM | POA: Diagnosis not present

## 2016-11-11 DIAGNOSIS — K219 Gastro-esophageal reflux disease without esophagitis: Secondary | ICD-10-CM | POA: Diagnosis not present

## 2016-11-11 DIAGNOSIS — D696 Thrombocytopenia, unspecified: Secondary | ICD-10-CM | POA: Insufficient documentation

## 2016-11-11 LAB — COMPREHENSIVE METABOLIC PANEL
ALT: 22 U/L (ref 14–54)
AST: 23 U/L (ref 15–41)
Albumin: 3.9 g/dL (ref 3.5–5.0)
Alkaline Phosphatase: 42 U/L (ref 38–126)
Anion gap: 4 — ABNORMAL LOW (ref 5–15)
BUN: 16 mg/dL (ref 6–20)
CO2: 30 mmol/L (ref 22–32)
Calcium: 8.8 mg/dL — ABNORMAL LOW (ref 8.9–10.3)
Chloride: 104 mmol/L (ref 101–111)
Creatinine, Ser: 0.63 mg/dL (ref 0.44–1.00)
GFR calc Af Amer: >60 mL/min (ref >60)
GFR calc non Af Amer: >60 mL/min (ref >60)
Glucose, Bld: 102 mg/dL — ABNORMAL HIGH (ref 65–99)
Potassium: 3.9 mmol/L (ref 3.5–5.1)
Sodium: 138 mmol/L (ref 135–145)
Total Bilirubin: 0.8 mg/dL (ref 0.3–1.2)
Total Protein: 6.2 g/dL — ABNORMAL LOW (ref 6.5–8.1)

## 2016-11-11 LAB — CBC WITH DIFFERENTIAL/PLATELET
Basophils Absolute: 0 10*3/uL (ref 0–0.1)
Basophils Relative: 1 %
Eosinophils Absolute: 0.1 10*3/uL (ref 0–0.7)
Eosinophils Relative: 2 %
HCT: 40.3 % (ref 35.0–47.0)
Hemoglobin: 13.5 g/dL (ref 12.0–16.0)
Lymphocytes Relative: 28 %
Lymphs Abs: 1.6 10*3/uL (ref 1.0–3.6)
MCH: 29.9 pg (ref 26.0–34.0)
MCHC: 33.5 g/dL (ref 32.0–36.0)
MCV: 89.3 fL (ref 80.0–100.0)
Monocytes Absolute: 0.4 10*3/uL (ref 0.2–0.9)
Monocytes Relative: 7 %
Neutro Abs: 3.7 10*3/uL (ref 1.4–6.5)
Neutrophils Relative %: 62 %
Platelets: 129 10*3/uL — ABNORMAL LOW (ref 150–440)
RBC: 4.51 MIL/uL (ref 3.80–5.20)
RDW: 14.3 % (ref 11.5–14.5)
WBC: 5.9 10*3/uL (ref 3.6–11.0)

## 2016-11-12 LAB — THYROID PANEL WITH TSH
Free Thyroxine Index: 2.7 (ref 1.2–4.9)
T3 Uptake Ratio: 32 % (ref 24–39)
T4, Total: 8.3 ug/dL (ref 4.5–12.0)
TSH: 0.092 u[IU]/mL — ABNORMAL LOW (ref 0.450–4.500)

## 2016-11-13 ENCOUNTER — Other Ambulatory Visit: Payer: Self-pay | Admitting: Internal Medicine

## 2016-11-13 DIAGNOSIS — C73 Malignant neoplasm of thyroid gland: Secondary | ICD-10-CM

## 2016-11-14 ENCOUNTER — Encounter
Admission: RE | Admit: 2016-11-14 | Discharge: 2016-11-14 | Disposition: A | Discharge status: Home / Self Care | Payer: PPO | Source: Ambulatory Visit | Attending: Surgery | Admitting: Surgery

## 2016-11-14 DIAGNOSIS — Z01810 Encounter for preprocedural cardiovascular examination: Secondary | ICD-10-CM | POA: Diagnosis not present

## 2016-11-14 DIAGNOSIS — C3431 Malignant neoplasm of lower lobe, right bronchus or lung: Secondary | ICD-10-CM | POA: Diagnosis not present

## 2016-11-14 DIAGNOSIS — Z01818 Encounter for other preprocedural examination: Secondary | ICD-10-CM | POA: Diagnosis not present

## 2016-11-14 DIAGNOSIS — E890 Postprocedural hypothyroidism: Secondary | ICD-10-CM | POA: Diagnosis not present

## 2016-11-14 DIAGNOSIS — Z902 Acquired absence of lung [part of]: Secondary | ICD-10-CM | POA: Diagnosis not present

## 2016-11-14 LAB — CBC
HCT: 39.3 % (ref 35.0–47.0)
Hemoglobin: 13.4 g/dL (ref 12.0–16.0)
MCH: 30.4 pg (ref 26.0–34.0)
MCHC: 34.1 g/dL (ref 32.0–36.0)
MCV: 89.2 fL (ref 80.0–100.0)
Platelets: 122 10*3/uL — ABNORMAL LOW (ref 150–440)
RBC: 4.41 MIL/uL (ref 3.80–5.20)
RDW: 14.3 % (ref 11.5–14.5)
WBC: 4.9 10*3/uL (ref 3.6–11.0)

## 2016-11-14 NOTE — Telephone Encounter (Signed)
Thyroid Panel With TSH  Order: [id number]  Status:  Final result Visible to patient:  No (Not Released) Next appt:  11/15/2016 at 03:00 PM in Radiology [facility]) Dx:  Thyroid cancer ([place])    Ref Range & Units 3d ago 110moago   TSH 0.450 - 4.500 uIU/mL 0.092   0.124     T4, Total 4.5 - 12.0 ug/dL 8.3  9.3    T3 Uptake Ratio 24 - 39 % 32  33    Free Thyroxine Index 1.2 - 4.9 2.7  3.1CM

## 2016-11-15 ENCOUNTER — Ambulatory Visit
Admission: RE | Admit: 2016-11-15 | Discharge: 2016-11-15 | Disposition: A | Discharge status: Home / Self Care | Payer: PPO | Source: Ambulatory Visit | Attending: Internal Medicine | Admitting: Internal Medicine

## 2016-11-15 ENCOUNTER — Other Ambulatory Visit: Payer: PPO

## 2016-11-15 ENCOUNTER — Ambulatory Visit: Admission: RE | Admit: 2016-11-15 | Payer: PPO | Source: Ambulatory Visit

## 2016-11-15 DIAGNOSIS — C3431 Malignant neoplasm of lower lobe, right bronchus or lung: Secondary | ICD-10-CM

## 2016-11-15 DIAGNOSIS — E890 Postprocedural hypothyroidism: Secondary | ICD-10-CM | POA: Insufficient documentation

## 2016-11-15 DIAGNOSIS — Z902 Acquired absence of lung [part of]: Secondary | ICD-10-CM | POA: Insufficient documentation

## 2016-11-15 DIAGNOSIS — Z01818 Encounter for other preprocedural examination: Secondary | ICD-10-CM | POA: Diagnosis not present

## 2016-11-15 DIAGNOSIS — C3491 Malignant neoplasm of unspecified part of right bronchus or lung: Secondary | ICD-10-CM | POA: Diagnosis not present

## 2016-11-15 MED ORDER — IOPAMIDOL (ISOVUE-300) INJECTION 61%
75.0000 mL | Freq: Once | INTRAVENOUS | Status: AC | PRN
  Administered 2016-11-15: 75 mL via INTRAVENOUS

## 2016-11-16 ENCOUNTER — Telehealth: Payer: Self-pay | Admitting: *Deleted

## 2016-11-16 ENCOUNTER — Ambulatory Visit: Payer: PPO | Admitting: Internal Medicine

## 2016-11-16 ENCOUNTER — Inpatient Hospital Stay: Payer: PPO | Admitting: Internal Medicine

## 2016-11-16 NOTE — Telephone Encounter (Signed)
Pt called requested RF of levothyroxine.  I explained to pt that rx was sent in on Monday 11/14/16. She states that she was not aware. Patient called to question her dosing of levothyroxine 100 mcg. Does Dr. B want to change this dose based on my lab levels from the other day?  Md in exam room at present time, I will verify with md and call pt back shortly.  Also, pt inquiring about her ct scan results from yesterday. I reassured pt that there was no evidence of metastatic disease.  Pt thanked me for calling her back.  Contacted patient back at 1248 - md states to keep patient on levothyroxine 100 mcg dosing 1 tab daily.

## 2016-11-16 NOTE — Telephone Encounter (Signed)
-----   Message from [place] sent at 11/16/2016  9:13 AM EST ----- Regarding: CT results Patient was scheduled for appt today for CT results but has rescheduled appt to 1/25 bc she needs med refills.  She would like for the nurse to call her today with CT results, she is very anxious. Thanks!

## 2016-11-18 ENCOUNTER — Other Ambulatory Visit: Payer: PPO

## 2016-11-18 ENCOUNTER — Telehealth: Payer: Self-pay | Admitting: Surgery

## 2016-11-18 ENCOUNTER — Ambulatory Visit: Payer: PPO | Admitting: Internal Medicine

## 2016-11-18 NOTE — Telephone Encounter (Signed)
Surgery was canceled on 11/17/16 due to weather conditions.  Surgery rescheduled: Pt advised of pre op date/time and sx date. Sx: 11/23/16 with Dr Lenore Manner cholecystectomy.  Pre op: No pre op.   Patient made aware to call [phone], between 1-3:00pm the day before surgery, to find out what time to arrive.

## 2016-11-21 ENCOUNTER — Inpatient Hospital Stay (HOSPITAL_BASED_OUTPATIENT_CLINIC_OR_DEPARTMENT_OTHER): Payer: PPO | Admitting: Internal Medicine

## 2016-11-21 VITALS — BP 120/76 | HR 82 | Temp 96.6°F | Wt 118.0 lb

## 2016-11-21 DIAGNOSIS — D696 Thrombocytopenia, unspecified: Secondary | ICD-10-CM | POA: Diagnosis not present

## 2016-11-21 DIAGNOSIS — C73 Malignant neoplasm of thyroid gland: Secondary | ICD-10-CM | POA: Diagnosis not present

## 2016-11-21 DIAGNOSIS — C3431 Malignant neoplasm of lower lobe, right bronchus or lung: Secondary | ICD-10-CM

## 2016-11-21 DIAGNOSIS — M8580 Other specified disorders of bone density and structure, unspecified site: Secondary | ICD-10-CM

## 2016-11-21 DIAGNOSIS — Z79899 Other long term (current) drug therapy: Secondary | ICD-10-CM

## 2016-11-21 NOTE — Assessment & Plan Note (Addendum)
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 Ct Chest jan 2018- NED.  # Thyroid Ca s/p thyroidectomy- STAGE I /LOW risk - on Synthroid 100 mcg /day; currently on 0.092. Change the dose of synthroid to 64mg/day [goal 0.1 to 0.5 ].   # osteopenia/ hypocalcemia- vit D+ ca BID; exercise.   # Thrombocytopenia- platelets 122 question ITP. Discussed with the patient given her upcoming surgery- platelets above 100,000 is reasonable. I would not recommend any interventions at this time.   # follow up in 6 months/CT scan/labs prior.   # I reviewed the blood work- with the patient in detail; also reviewed the imaging independently [as summarized above]; and with the patient in detail.   Cc: Dr.Loflin.

## 2016-11-21 NOTE — Progress Notes (Signed)
[place] OFFICE PROGRESS NOTE  Patient Care Team: Tonia Ghent, MD as PCP - General (Family Medicine) Beverly Gust, MD (Unknown Physician Specialty) Nestor Lewandowsky, MD as Referring Physician (Cardiothoracic Surgery) Crista Curb. Gloriann Loan, MD as Referring Physician (Ophthalmology) Cammie Sickle, MD as Consulting Physician (Internal Medicine) Hubbard Robinson, MD as Consulting Physician (Surgery)  Cancer Staging Cancer of lung [facility]) Staging form: Lung, AJCC 7th Edition - Clinical: T1, N0, M0 - Signed by Forest Gleason, MD on 10/01/2015  Thyroid cancer [facility]) Staging form: Thyroid - Papillary or Follicular (Under 45 years), AJCC 7th Edition - Clinical: Stage I (T1, N0, M0) - Signed by Forest Gleason, MD on 09/11/2015    Oncology History   # NOV 2016- RLL [place] ca T1N0 [incidental; Dr.Simonds; Bronc- s/p RLlobectomy; Dr.Oaks;Dec 2016]  # FEB 2017- 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 STAGE I; s/p Total Thyroidetcomy [Dr.Chapman];NO RAIU.  LOW RISK; but detectable thyroglobulin- on Synthroid     Cancer of lung ([place])   10/01/2015 Initial Diagnosis    Cancer of lung ([place])       Cancer of lower lobe of right lung ([place])   05/06/2016 Initial Diagnosis    Malignant neoplasm of lower lobe, right bronchus or lung ([place])       INTERVAL HISTORY:  [name] 71 y.o.  female pleasant patient above history of stage I lung cancer status post lobectomy; m and also history of thyroid cancer status post thyroidectomy currently on Synthroid is here for follow-up.  Patient is awaiting cholecystectomy in 2 days from now.   Patient complains of mild fatigue. She continues to be on Synthroid 100  She denies any palpitations. Denies any chest pain. Denies any shortness of breath or cough or swelling in the legs.   REVIEW OF SYSTEMS:  A complete 10 point review of system is done which is negative except  mentioned above/history of present illness.   PAST MEDICAL HISTORY :  Past Medical History:  Diagnosis Date  . Cancer of lung ([place]) 10/01/2015  . GERD (gastroesophageal reflux disease)   . Groin pain    Along superior/laterl R inguinal canal.  Could be due to hernia or old scar tissue.  prev with CT done w/o alarming findings.  Will treat episodically.  Use tylenol #3 prn.    . Hyperlipidemia   . Hypertension   . Lipoma of thigh    Left  . Normal cardiac stress test 2014  . PONV (postoperative nausea and vomiting)    nausea after hysterectomy, and 2 days after lung surgery 10/2015  . Thyroid nodule     PAST SURGICAL HISTORY :   Past Surgical History:  Procedure Laterality Date  . ABDOMINAL HYSTERECTOMY  12-30-08  . BREAST MASS EXCISION  1977   benign  . CARPAL TUNNEL RELEASE Right 1978  . COLONOSCOPY  2009  . DILATION AND CURETTAGE OF UTERUS  2001  . ELECTROMAGNETIC NAVIGATION BROCHOSCOPY N/A 08/25/2015   Procedure: ELECTROMAGNETIC NAVIGATION BRONCHOSCOPY;  Surgeon: Flora Lipps, MD;  Location: [facility] ORS;  Service: Cardiopulmonary;  Laterality: N/A;  . ENDOBRONCHIAL ULTRASOUND N/A 08/25/2015   Procedure: ENDOBRONCHIAL ULTRASOUND;  Surgeon: Flora Lipps, MD;  Location: [facility] ORS;  Service: Cardiopulmonary;  Laterality: N/A;  . ESOPHAGOGASTRODUODENOSCOPY  2002, 11/15/07   Normal (Dr. Allyn Kenner)  . FACIAL COSMETIC SURGERY  01/13/10   mini facelift  . HERNIA REPAIR  1976  . THORACOTOMY/LOBECTOMY Right 10/27/2015   Procedure:  THORACOTOMY/LOBECTOMY;  Surgeon: Nestor Lewandowsky, MD;  Location: [facility] ORS;  Service: Thoracic;  Laterality: Right;  . THYROIDECTOMY N/A 12/29/2015   Procedure: THYROIDECTOMY;  Surgeon: Beverly Gust, MD;  Location: [facility] ORS;  Service: ENT;  Laterality: N/A;  . TOTAL VAGINAL HYSTERECTOMY  12/30/08   for fibroid pain (Dr. Gertie Fey)    FAMILY HISTORY :   Family History  Problem Relation Age of Onset  . Transient ischemic attack Mother   . Hypertension Mother   . Stroke  Mother     mini strokes  . Hypertension Sister   . Cancer Sister     ovarian  . Hypertension Sister   . Hypertension Sister   . Hypertension Sister   . Breast cancer Neg Hx   . Colon cancer Neg Hx     SOCIAL HISTORY:   Social History  Substance Use Topics  . Smoking status: Never Smoker  . Smokeless tobacco: Never Used  . Alcohol use No    ALLERGIES:  is allergic to atorvastatin; ciprofloxacin; epinephrine; metoprolol succinate; simvastatin; and sulfonamide derivatives.  MEDICATIONS:  Current Outpatient Prescriptions  Medication Sig Dispense Refill  . Coenzyme Q10 (COQ10) 200 MG CAPS Take 200 mg by mouth daily with supper.    . levothyroxine (SYNTHROID, LEVOTHROID) 100 MCG tablet TAKE 1 TABLET (100 MCG TOTAL) BY MOUTH DAILY BEFORE BREAKFAST. 30 tablet 3  . omeprazole (PRILOSEC) 40 MG capsule Take 1 capsule (40 mg total) by mouth daily. 90 capsule 3  . OVER THE COUNTER MEDICATION Take 1 tablet by mouth daily with supper. calcium 500 mg and K 40 mcg    . Potassium Gluconate 595 MG CAPS Take 595 mg by mouth daily.     . pravastatin (PRAVACHOL) 40 MG tablet Take 1 tablet (40 mg total) by mouth daily. (Patient taking differently: Take 40 mg by mouth at bedtime. ) 90 tablet 3  . Ascorbic Acid (VITAMIN C) 500 MG tablet Take 2,000 mg by mouth daily. Reported on 02/04/2016    . Biotin (PA BIOTIN) 1000 MCG tablet Take 1,000 mcg by mouth daily. Reported on 02/04/2016    . Cholecalciferol (VITAMIN D-3) 1000 units CAPS Take 1 capsule by mouth daily with supper.     . Folic Acid-Vit E9-HBZ J69 (FA-VITAMIN B-6-VITAMIN B-12 PO) Take 1 tablet by mouth daily after breakfast. Reported on 02/04/2016    . Lutein 20 MG CAPS Take 20 mg by mouth daily.    . Omega-3 Fatty Acids (SUPER OMEGA 3 EPA/DHA) 1000 MG CAPS Take 1,000 mg by mouth daily with supper.     No current facility-administered medications for this visit.     PHYSICAL EXAMINATION: ECOG PERFORMANCE STATUS: 0 - Asymptomatic  BP 120/76 (BP  Location: Left Arm, Patient Position: Sitting)   Pulse 82   Temp (!) 96.6 F (35.9 C) (Tympanic)   Wt 118 lb (53.5 kg)   BMI 20.25 kg/m   Filed Weights   11/21/16 1528  Weight: 118 lb (53.5 kg)    GENERAL: Well-nourished well-developed; Alert, no distress and comfortable.   Accompanied by husband. EYES: no pallor or icterus OROPHARYNX: no thrush or ulceration; good dentition  NECK: supple, no masses felt LYMPH:  no palpable lymphadenopathy in the cervical, axillary or inguinal regions LUNGS: clear to auscultation and  No wheeze or crackles HEART/CVS: regular rate & rhythm and no murmurs; No lower extremity edema ABDOMEN:abdomen soft, non-tender and normal bowel sounds Musculoskeletal:no cyanosis of digits and no clubbing  PSYCH: alert & oriented x 3 with  fluent speech NEURO: no focal motor/sensory deficits SKIN:  no rashes or significant lesions  LABORATORY DATA:  I have reviewed the data as listed    Component Value Date/Time   NA 138 11/11/2016 1002   NA 141 07/01/2013 0103   K 3.9 11/11/2016 1002   K 3.8 07/01/2013 0103   CL 104 11/11/2016 1002   CL 110 (H) 07/01/2013 0103   CO2 30 11/11/2016 1002   CO2 27 07/01/2013 0103   GLUCOSE 102 (H) 11/11/2016 1002   GLUCOSE 88 07/01/2013 0103   BUN 16 11/11/2016 1002   BUN 11 07/01/2013 0103   CREATININE 0.63 11/11/2016 1002   CREATININE 0.68 07/01/2013 0103   CALCIUM 8.8 (L) 11/11/2016 1002   CALCIUM 8.2 (L) 07/01/2013 0103   PROT 6.2 (L) 11/11/2016 1002   PROT 5.5 (L) 07/01/2013 0103   ALBUMIN 3.9 11/11/2016 1002   ALBUMIN 3.0 (L) 07/01/2013 0103   AST 23 11/11/2016 1002   AST 19 07/01/2013 0103   ALT 22 11/11/2016 1002   ALT 24 07/01/2013 0103   ALKPHOS 42 11/11/2016 1002   ALKPHOS 55 07/01/2013 0103   BILITOT 0.8 11/11/2016 1002   BILITOT 0.7 07/01/2013 0103   GFRNONAA >60 11/11/2016 1002   GFRNONAA >60 07/01/2013 0103   GFRAA >60 11/11/2016 1002   GFRAA >60 07/01/2013 0103    No results found for: SPEP,  UPEP  Lab Results  Component Value Date   WBC 4.9 11/14/2016   NEUTROABS 3.7 11/11/2016   HGB 13.4 11/14/2016   HCT 39.3 11/14/2016   MCV 89.2 11/14/2016   PLT 122 (L) 11/14/2016      Chemistry      Component Value Date/Time   NA 138 11/11/2016 1002   NA 141 07/01/2013 0103   K 3.9 11/11/2016 1002   K 3.8 07/01/2013 0103   CL 104 11/11/2016 1002   CL 110 (H) 07/01/2013 0103   CO2 30 11/11/2016 1002   CO2 27 07/01/2013 0103   BUN 16 11/11/2016 1002   BUN 11 07/01/2013 0103   CREATININE 0.63 11/11/2016 1002   CREATININE 0.68 07/01/2013 0103      Component Value Date/Time   CALCIUM 8.8 (L) 11/11/2016 1002   CALCIUM 8.2 (L) 07/01/2013 0103   ALKPHOS 42 11/11/2016 1002   ALKPHOS 55 07/01/2013 0103   AST 23 11/11/2016 1002   AST 19 07/01/2013 0103   ALT 22 11/11/2016 1002   ALT 24 07/01/2013 0103   BILITOT 0.8 11/11/2016 1002   BILITOT 0.7 07/01/2013 0103       RADIOGRAPHIC STUDIES: I have personally reviewed the radiological images as listed and agreed with the findings in the report. No results found.   ASSESSMENT & PLAN:  Cancer of lower lobe of right lung ([place]) # RLL Lung ca; stage I no adjuvant therapy. Ct Chest jan 2018- NED.  # Thyroid Ca s/p thyroidectomy- STAGE I /LOW risk - on Synthroid 100 mcg /day; currently on 0.092. Change the dose of synthroid to 41mg/day [goal 0.1 to 0.5 ].   # osteopenia/ hypocalcemia- vit D+ ca BID; exercise.   # Thrombocytopenia- platelets 122 question ITP. Discussed with the patient given her upcoming surgery- platelets above 100,000 is reasonable. I would not recommend any interventions at this time.   # follow up in 6 months/CT scan/labs prior.   # I reviewed the blood work- with the patient in detail; also reviewed the imaging independently [as summarized above]; and with the patient in detail.  Cc: Dr.Loflin.    Orders Placed This Encounter  Procedures  . CT CHEST WO CONTRAST    Standing Status:   Future     Standing Expiration Date:   01/21/2018    Order Specific Question:   Reason for Exam (SYMPTOM  OR DIAGNOSIS REQUIRED)    Answer:   lung cancer    Order Specific Question:   Preferred imaging location?    Answer:   [facility]  . Thyroid Panel With TSH    Standing Status:   Future    Standing Expiration Date:   11/21/2017  . TgAb+Thyroglobulin IMA or RIA    Standing Status:   Future    Standing Expiration Date:   11/21/2017  . CBC with Differential    Standing Status:   Future    Standing Expiration Date:   11/21/2017  . Basic metabolic panel    Standing Status:   Future    Standing Expiration Date:   11/21/2017   All questions were answered. The patient knows to call the clinic with any problems, questions or concerns.      Cammie Sickle, MD 11/21/2016 4:43 PM

## 2016-11-21 NOTE — Progress Notes (Signed)
Patient here today for follow up.   

## 2016-11-22 MED ORDER — DEXTROSE 5 % IV SOLN
2.0000 g | INTRAVENOUS | Status: AC
  Administered 2016-11-23: 2 g via INTRAVENOUS
  Filled 2016-11-22: qty 2

## 2016-11-23 ENCOUNTER — Ambulatory Visit
Admission: RE | Admit: 2016-11-23 | Discharge: 2016-11-23 | Disposition: A | Discharge status: Home / Self Care | Payer: PPO | Source: Ambulatory Visit | Attending: Surgery | Admitting: Surgery

## 2016-11-23 ENCOUNTER — Ambulatory Visit: Payer: PPO | Admitting: Anesthesiology

## 2016-11-23 ENCOUNTER — Encounter: Payer: Self-pay | Admitting: *Deleted

## 2016-11-23 ENCOUNTER — Ambulatory Visit: Payer: PPO | Admitting: Internal Medicine

## 2016-11-23 ENCOUNTER — Encounter
Admission: RE | Disposition: A | Discharge status: Home / Self Care | Payer: Self-pay | Source: Ambulatory Visit | Attending: Surgery

## 2016-11-23 DIAGNOSIS — K8050 Calculus of bile duct without cholangitis or cholecystitis without obstruction: Secondary | ICD-10-CM | POA: Diagnosis not present

## 2016-11-23 DIAGNOSIS — R1011 Right upper quadrant pain: Secondary | ICD-10-CM | POA: Diagnosis not present

## 2016-11-23 DIAGNOSIS — K811 Chronic cholecystitis: Secondary | ICD-10-CM | POA: Insufficient documentation

## 2016-11-23 DIAGNOSIS — I10 Essential (primary) hypertension: Secondary | ICD-10-CM | POA: Diagnosis not present

## 2016-11-23 HISTORY — PX: CHOLECYSTECTOMY: SHX55

## 2016-11-23 HISTORY — PX: APPENDECTOMY: SHX54

## 2016-11-23 SURGERY — LAPAROSCOPIC CHOLECYSTECTOMY
Anesthesia: General | Wound class: Clean Contaminated

## 2016-11-23 MED ORDER — BUPIVACAINE HCL (PF) 0.5 % IJ SOLN
INTRAMUSCULAR | Status: AC
  Filled 2016-11-23: qty 30

## 2016-11-23 MED ORDER — DEXAMETHASONE SODIUM PHOSPHATE 10 MG/ML IJ SOLN
INTRAMUSCULAR | Status: DC | PRN
  Administered 2016-11-23: 5 mg via INTRAVENOUS

## 2016-11-23 MED ORDER — CHLORHEXIDINE GLUCONATE CLOTH 2 % EX PADS: 6.0000 | MEDICATED_PAD | Freq: Once | CUTANEOUS | Status: DC

## 2016-11-23 MED ORDER — OXYCODONE HCL 5 MG PO TABS
5.0000 mg | ORAL_TABLET | Freq: Once | ORAL | Status: AC | PRN
  Administered 2016-11-23: 5 mg via ORAL

## 2016-11-23 MED ORDER — PROPOFOL 10 MG/ML IV BOLUS
INTRAVENOUS | Status: AC
  Filled 2016-11-23: qty 20

## 2016-11-23 MED ORDER — FENTANYL CITRATE (PF) 100 MCG/2ML IJ SOLN
INTRAMUSCULAR | Status: DC | PRN
  Administered 2016-11-23 (×2): 50 ug via INTRAVENOUS

## 2016-11-23 MED ORDER — LIDOCAINE HCL (PF) 1 % IJ SOLN
INTRAMUSCULAR | Status: AC
  Filled 2016-11-23: qty 30

## 2016-11-23 MED ORDER — MIDAZOLAM HCL 2 MG/2ML IJ SOLN
INTRAMUSCULAR | Status: AC
  Filled 2016-11-23: qty 2

## 2016-11-23 MED ORDER — FENTANYL CITRATE (PF) 100 MCG/2ML IJ SOLN
INTRAMUSCULAR | Status: AC
  Administered 2016-11-23: 25 ug via INTRAVENOUS
  Filled 2016-11-23: qty 2

## 2016-11-23 MED ORDER — DEXAMETHASONE SODIUM PHOSPHATE 10 MG/ML IJ SOLN
INTRAMUSCULAR | Status: AC
  Filled 2016-11-23: qty 1

## 2016-11-23 MED ORDER — ONDANSETRON HCL 4 MG/2ML IJ SOLN
INTRAMUSCULAR | Status: DC | PRN
  Administered 2016-11-23: 4 mg via INTRAVENOUS

## 2016-11-23 MED ORDER — ONDANSETRON HCL 4 MG/2ML IJ SOLN
INTRAMUSCULAR | Status: AC
  Filled 2016-11-23: qty 2

## 2016-11-23 MED ORDER — SUGAMMADEX SODIUM 200 MG/2ML IV SOLN
INTRAVENOUS | Status: DC | PRN
  Administered 2016-11-23: 110 mg via INTRAVENOUS

## 2016-11-23 MED ORDER — PROMETHAZINE HCL 25 MG/ML IJ SOLN: 6.2500 mg | INTRAMUSCULAR | Status: DC | PRN

## 2016-11-23 MED ORDER — BUPIVACAINE HCL (PF) 0.5 % IJ SOLN
INTRAMUSCULAR | Status: DC | PRN
  Administered 2016-11-23: 9.5 mL

## 2016-11-23 MED ORDER — OXYCODONE HCL 5 MG/5ML PO SOLN: 5.0000 mg | Freq: Once | ORAL | Status: AC | PRN

## 2016-11-23 MED ORDER — FENTANYL CITRATE (PF) 100 MCG/2ML IJ SOLN
INTRAMUSCULAR | Status: AC
  Filled 2016-11-23: qty 2

## 2016-11-23 MED ORDER — HYDROCODONE-ACETAMINOPHEN 5-325 MG PO TABS: 1.0000 | ORAL_TABLET | Freq: Four times a day (QID) | ORAL | 0 refills | Status: DC | PRN

## 2016-11-23 MED ORDER — SUCCINYLCHOLINE CHLORIDE 200 MG/10ML IV SOSY
PREFILLED_SYRINGE | INTRAVENOUS | Status: AC
  Filled 2016-11-23: qty 10

## 2016-11-23 MED ORDER — FENTANYL CITRATE (PF) 100 MCG/2ML IJ SOLN
25.0000 ug | INTRAMUSCULAR | Status: DC | PRN
  Administered 2016-11-23 (×4): 25 ug via INTRAVENOUS

## 2016-11-23 MED ORDER — KETOROLAC TROMETHAMINE 30 MG/ML IJ SOLN
INTRAMUSCULAR | Status: AC
  Filled 2016-11-23: qty 1

## 2016-11-23 MED ORDER — ACETAMINOPHEN 10 MG/ML IV SOLN
INTRAVENOUS | Status: DC | PRN
  Administered 2016-11-23: 1000 mg via INTRAVENOUS

## 2016-11-23 MED ORDER — LIDOCAINE HCL (PF) 1 % IJ SOLN
INTRAMUSCULAR | Status: DC | PRN
  Administered 2016-11-23: 9.5 mL

## 2016-11-23 MED ORDER — PROPOFOL 10 MG/ML IV BOLUS
INTRAVENOUS | Status: DC | PRN
  Administered 2016-11-23: 150 mg via INTRAVENOUS

## 2016-11-23 MED ORDER — ROCURONIUM BROMIDE 100 MG/10ML IV SOLN
INTRAVENOUS | Status: DC | PRN
  Administered 2016-11-23 (×2): 10 mg via INTRAVENOUS
  Administered 2016-11-23: 20 mg via INTRAVENOUS

## 2016-11-23 MED ORDER — SUCCINYLCHOLINE CHLORIDE 20 MG/ML IJ SOLN
INTRAMUSCULAR | Status: DC | PRN
  Administered 2016-11-23: 100 mg via INTRAVENOUS

## 2016-11-23 MED ORDER — ROCURONIUM BROMIDE 50 MG/5ML IV SOSY
PREFILLED_SYRINGE | INTRAVENOUS | Status: AC
  Filled 2016-11-23: qty 5

## 2016-11-23 MED ORDER — SODIUM CHLORIDE FLUSH 0.9 % IV SOLN
INTRAVENOUS | Status: AC
  Filled 2016-11-23: qty 10

## 2016-11-23 MED ORDER — LIDOCAINE HCL (PF) 2 % IJ SOLN
INTRAMUSCULAR | Status: AC
  Filled 2016-11-23: qty 2

## 2016-11-23 MED ORDER — MIDAZOLAM HCL 5 MG/5ML IJ SOLN
INTRAMUSCULAR | Status: DC | PRN
  Administered 2016-11-23: 2 mg via INTRAVENOUS

## 2016-11-23 MED ORDER — OXYCODONE HCL 5 MG PO TABS
ORAL_TABLET | ORAL | Status: AC
  Filled 2016-11-23: qty 1

## 2016-11-23 MED ORDER — LIDOCAINE HCL (CARDIAC) 20 MG/ML IV SOLN
INTRAVENOUS | Status: DC | PRN
  Administered 2016-11-23: 60 mg via INTRAVENOUS

## 2016-11-23 MED ORDER — SUGAMMADEX SODIUM 200 MG/2ML IV SOLN
INTRAVENOUS | Status: AC
  Filled 2016-11-23: qty 2

## 2016-11-23 MED ORDER — MEPERIDINE HCL 25 MG/ML IJ SOLN: 6.2500 mg | INTRAMUSCULAR | Status: DC | PRN

## 2016-11-23 MED ORDER — LACTATED RINGERS IV SOLN
INTRAVENOUS | Status: DC
  Administered 2016-11-23 (×2): via INTRAVENOUS

## 2016-11-23 MED ORDER — ACETAMINOPHEN 10 MG/ML IV SOLN
INTRAVENOUS | Status: AC
  Filled 2016-11-23: qty 100

## 2016-11-23 MED ORDER — KETOROLAC TROMETHAMINE 30 MG/ML IJ SOLN
INTRAMUSCULAR | Status: DC | PRN
  Administered 2016-11-23: 30 mg via INTRAVENOUS

## 2016-11-23 SURGICAL SUPPLY — 45 items
APPLIER CLIP 5 13 M/L LIGAMAX5 (MISCELLANEOUS) ×3
BLADE SURG 15 STRL LF DISP TIS (BLADE) ×2 IMPLANT
BLADE SURG 15 STRL SS (BLADE) ×2
CANISTER SUCT 1200ML W/VALVE (MISCELLANEOUS) ×3 IMPLANT
CATH CHOLANGI 4FR 420404F (CATHETERS) IMPLANT
CHLORAPREP W/TINT 26ML (MISCELLANEOUS) ×3 IMPLANT
CLIP APPLIE 5 13 M/L LIGAMAX5 (MISCELLANEOUS) ×2 IMPLANT
CONRAY 60ML FOR OR (MISCELLANEOUS) IMPLANT
DEFOGGER SCOPE WARMER CLEARIFY (MISCELLANEOUS) ×3 IMPLANT
DRAPE SHEET LG 3/4 BI-LAMINATE (DRAPES) ×3 IMPLANT
DRSG TEGADERM 2-3/8X2-3/4 SM (GAUZE/BANDAGES/DRESSINGS) ×12 IMPLANT
ELECT CAUTERY BLADE 6.4 (BLADE) ×3 IMPLANT
ELECT E-Z MONOPOLAR 33 (MISCELLANEOUS) ×3
ELECT REM PT RETURN 9FT ADLT (ELECTROSURGICAL) ×3
ELECTRODE E-Z MONOPOLAR 33 (MISCELLANEOUS) ×2 IMPLANT
ELECTRODE REM PT RTRN 9FT ADLT (ELECTROSURGICAL) ×2 IMPLANT
ENDOPOUCH RETRIEVER 10 (MISCELLANEOUS) ×3 IMPLANT
GLOVE BIOGEL PI IND STRL 6.5 (GLOVE) ×4 IMPLANT
GLOVE BIOGEL PI INDICATOR 6.5 (GLOVE) ×2
GLOVE PI ORTHOPRO 6.5 (GLOVE) ×1
GLOVE PI ORTHOPRO STRL 6.5 (GLOVE) ×2 IMPLANT
GLOVE SURG SYN 6.5 ES PF (GLOVE) ×6 IMPLANT
GOWN STRL REUS W/ TWL LRG LVL3 (GOWN DISPOSABLE) ×6 IMPLANT
GOWN STRL REUS W/TWL LRG LVL3 (GOWN DISPOSABLE) ×3
IRRIGATION STRYKERFLOW (MISCELLANEOUS) IMPLANT
IRRIGATOR STRYKERFLOW (MISCELLANEOUS)
IV CATH ANGIO 12GX3 LT BLUE (NEEDLE) IMPLANT
IV NS 1000ML (IV SOLUTION) ×4
IV NS 1000ML BAXH (IV SOLUTION) ×4 IMPLANT
LABEL OR SOLS (LABEL) IMPLANT
LIQUID BAND (GAUZE/BANDAGES/DRESSINGS) ×3 IMPLANT
NEEDLE HYPO 25X1 1.5 SAFETY (NEEDLE) ×3 IMPLANT
NS IRRIG 500ML POUR BTL (IV SOLUTION) ×3 IMPLANT
PACK LAP CHOLECYSTECTOMY (MISCELLANEOUS) ×3 IMPLANT
PENCIL ELECTRO HAND CTR (MISCELLANEOUS) ×3 IMPLANT
SCISSORS METZENBAUM CVD 33 (INSTRUMENTS) ×3 IMPLANT
SET SUCTION IRRIG HYDROSURG (IRRIGATION / IRRIGATOR) ×3 IMPLANT
SLEEVE ENDOPATH XCEL 5M (ENDOMECHANICALS) ×6 IMPLANT
SUT MNCRL 4-0 (SUTURE) ×4
SUT MNCRL 4-0 27XMFL (SUTURE) ×4
SUT VICRYL 0 AB UR-6 (SUTURE) IMPLANT
SUTURE MNCRL 4-0 27XMF (SUTURE) ×4 IMPLANT
TROCAR XCEL BLUNT TIP 100MML (ENDOMECHANICALS) ×3 IMPLANT
TROCAR XCEL NON-BLD 5MMX100MML (ENDOMECHANICALS) ×3 IMPLANT
TUBING INSUFFLATOR HI FLOW (MISCELLANEOUS) ×3 IMPLANT

## 2016-11-23 NOTE — Anesthesia Post-op Follow-up Note (Cosign Needed)
Anesthesia QCDR form completed.        

## 2016-11-23 NOTE — Anesthesia Procedure Notes (Signed)
Procedure Name: Intubation Date/Time: 11/23/2016 3:03 PM Performed by: Dionne Bucy Pre-anesthesia Checklist: Patient identified, Patient being monitored, Timeout performed, Emergency Drugs available and Suction available Patient Re-evaluated:Patient Re-evaluated prior to inductionOxygen Delivery Method: Circle system utilized Preoxygenation: Pre-oxygenation with 100% oxygen Intubation Type: IV induction Ventilation: Mask ventilation without difficulty Laryngoscope Size: Mac and 3 Grade View: Grade II Tube type: Oral Tube size: 7.0 mm Number of attempts: 1 Airway Equipment and Method: Stylet Placement Confirmation: ETT inserted through vocal cords under direct vision,  positive ETCO2 and breath sounds checked- equal and bilateral Secured at: 21 cm Tube secured with: Tape Dental Injury: Teeth and Oropharynx as per pre-operative assessment

## 2016-11-23 NOTE — Op Note (Signed)
Laparoscopic Cholecystectomy  Pre-operative Diagnosis: Biliary colic  Post-operative Diagnosis: Biliary colic  Procedure: Laparoscopic cholecystectomy  Surgeon: Juanda Crumble T. Adonis Huguenin, MD FACS  Anesthesia: Gen. with endotracheal tube  Assistant: None  Procedure Details  The patient was seen again in the Holding Room. The benefits, complications, treatment options, and expected outcomes were discussed with the patient. The risks of bleeding, infection, recurrence of symptoms, failure to resolve symptoms, bile duct damage, bile duct leak, retained common bile duct stone, bowel injury, any of which could require further surgery and/or ERCP, stent, or papillotomy were reviewed with the patient. The likelihood of improving the patient's symptoms with return to their baseline status is good.  The patient and/or family concurred with the proposed plan, giving informed consent.  The patient was taken to Operating Room, identified as [name] and the procedure verified as Laparoscopic Cholecystectomy.  A Time Out was held and the above information confirmed.  Prior to the induction of general anesthesia, antibiotic prophylaxis was administered. VTE prophylaxis was in place. General endotracheal anesthesia was then administered and tolerated well. After the induction, the abdomen was prepped with Chloraprep and draped in the sterile fashion. The patient was positioned in the supine position.  Local anesthetic was injected into the skin 2 fingerbreadths below the costal margin in the midclavicular line and an incision made. The Veress needle was placed. Pneumoperitoneum was then created with CO2 and tolerated well without any adverse changes in the patient's vital signs. A 71m port was placed in the incision site and the abdominal cavity was explored.  Two 5-mm ports were placed, one in the periumbilical region and an additional right upper quadrant and a 12 mm epigastric port was placed all under direct  vision. All skin incisions  were infiltrated with a local anesthetic agent before making the incision and placing the trocars.   The patient was positioned  in reverse Trendelenburg, tilted slightly to the patient's left.  The gallbladder was identified, the fundus grasped and retracted cephalad and found to have numerous inflammatory adhesions along the entire body of the gallbladder. Adhesions were lysed bluntly and with electrocautery. The infundibulum was grasped and retracted laterally, exposing the peritoneum overlying the triangle of Calot. This was then divided and exposed in a blunt fashion. A critical view of the cystic duct and cystic artery was obtained.  The cystic duct was clearly identified and bluntly dissected.   At this point an Endo Clip applier was brought to the field and the duct and artery were serially clipped with endoclips. They were cut in between with endoscopic shears without any difficulty. The gallbladder was taken from the gallbladder fossa in a retrograde fashion with the electrocautery. The cystic duct clipped came off during the dissection all today liver causing a spillage of bile. There were no spilled gallstones visualized. The gallbladder was removed and placed in an Endocatch bag. The liver bed was irrigated and inspected. Hemostasis was achieved with the electrocautery. Copious irrigation was utilized and was repeatedly aspirated until clear.  The gallbladder and Endocatch sac were then removed through the epigastric port site.   Inspection of the right upper quadrant was performed. No bleeding, bile duct injury or leak, or bowel injury was noted. Pneumoperitoneum was released.  4-0 subcuticular Monocryl was used to close the skin. Steristrips and Mastisol and sterile dressings were  applied.  The patient was then extubated and brought to the recovery room in stable condition. Sponge, lap, and needle counts were correct at closure and  at the conclusion of the case.    Findings: Evidence of previous Cholecystitis   Estimated Blood Loss: 10 mL         Drains: None         Specimens: Gallbladder           Complications: none               Karen Cataldo T. Adonis Huguenin, MD, FACS

## 2016-11-23 NOTE — H&P (Signed)
Patient ID: [name], female   DOB: Mar 06, 1946, 71 y.o.   MRN: [id number]  HPI [name] is a 71 y.o. female who is well-known to the surgery service for right lung mass as well as a left thigh sebaceous cyst removal within the past year comes to the OR today for a laparoscopic cholecystectomy. She has a long history of right upper quadrant pressure and pain for years. The patient states that after she eats she will have a pressure in her right upper abdomen just below the rib cage that will be either a pain or pressure and will require her to lay down or lay sideways in order to relieve the pressure. Patient has also noticed some bloating and some pain between her shoulders and pain in a bandlike area around her epigastric area. The patient states that the pain will come and go in waves and then it will gradually relieve after eating. Patient states that this is worse with fattier foods and sometimes it will come on and sometimes it won't. The patient has seen physicians for this in the past however with getting diagnosis and her thyroid as well as her long over the past year she has not revisited this since March 2016. Patient is currently still having these type symptoms weekly and sometimes they will last for several days when they come on. The patient currently denies any fever chills nausea vomiting abdominal bloating or pain diarrhea or constipation. No changes since her last visit and she states she is ready to have her gallbladder removed.  HPI  Past Medical History:  Diagnosis Date  . Cancer of lung ([place]) 10/01/2015  . GERD (gastroesophageal reflux disease)   . Groin pain    Along superior/laterl R inguinal canal.  Could be due to hernia or old scar tissue.  prev with CT done w/o alarming findings.  Will treat episodically.  Use tylenol #3 prn.    . Hyperlipidemia   . Hypertension   . Lipoma of thigh    Left  . Normal cardiac stress test 2014  . PONV (postoperative nausea and  vomiting)    nausea after hysterectomy, and 2 days after lung surgery 10/2015  . Thyroid nodule     Past Surgical History:  Procedure Laterality Date  . ABDOMINAL HYSTERECTOMY  12-30-08  . BREAST MASS EXCISION  1977   benign  . CARPAL TUNNEL RELEASE Right 1978  . COLONOSCOPY  2009  . DILATION AND CURETTAGE OF UTERUS  2001  . ELECTROMAGNETIC NAVIGATION BROCHOSCOPY N/A 08/25/2015   Procedure: ELECTROMAGNETIC NAVIGATION BRONCHOSCOPY;  Surgeon: Flora Lipps, MD;  Location: [facility];  Service: Cardiopulmonary;  Laterality: N/A;  . ENDOBRONCHIAL ULTRASOUND N/A 08/25/2015   Procedure: ENDOBRONCHIAL ULTRASOUND;  Surgeon: Flora Lipps, MD;  Location: [facility];  Service: Cardiopulmonary;  Laterality: N/A;  . ESOPHAGOGASTRODUODENOSCOPY  2002, 11/15/07   Normal (Dr. Allyn Kenner)  . FACIAL COSMETIC SURGERY  01/13/10   mini facelift  . HERNIA REPAIR  1976  . THORACOTOMY/LOBECTOMY Right 10/27/2015   Procedure: THORACOTOMY/LOBECTOMY;  Surgeon: Nestor Lewandowsky, MD;  Location: [facility];  Service: Thoracic;  Laterality: Right;  . THYROIDECTOMY N/A 12/29/2015   Procedure: THYROIDECTOMY;  Surgeon: Beverly Gust, MD;  Location: [facility];  Service: ENT;  Laterality: N/A;  . TOTAL VAGINAL HYSTERECTOMY  12/30/08   for fibroid pain (Dr. Gertie Fey)    Family History  Problem Relation Age of Onset  . Transient ischemic attack Mother   . Hypertension Mother   . Stroke Mother  mini strokes  . Hypertension Sister   . Cancer Sister     ovarian  . Hypertension Sister   . Hypertension Sister   . Hypertension Sister   . Breast cancer Neg Hx   . Colon cancer Neg Hx     Social History Social History  Substance Use Topics  . Smoking status: Never Smoker  . Smokeless tobacco: Never Used  . Alcohol use No    Allergies  Allergen Reactions  . Atorvastatin     REACTION: myalgias  . Ciprofloxacin     REACTION: increase reflux  . Epinephrine     Heart racing with dental injection  . Metoprolol Succinate Swelling     Swelling and stiffness in hands and ankles  . Simvastatin Other (See Comments)    Muscle aches and stiffness  . Sulfonamide Derivatives     REACTION: itching    Current Facility-Administered Medications  Medication Dose Route Frequency Provider Last Rate Last Dose  . cefoTEtan (CEFOTAN) 2 g in dextrose 5 % 50 mL IVPB  2 g Intravenous On Call to Hewitt, MD      . Chlorhexidine Gluconate Cloth 2 % PADS 6 each  6 each Topical Once Hubbard Robinson, MD       And  . Chlorhexidine Gluconate Cloth 2 % PADS 6 each  6 each Topical Once Hubbard Robinson, MD      . lactated ringers infusion   Intravenous Continuous Amy Penwarden, MD 50 mL/hr at 11/23/16 0934    . sodium chloride flush 0.9 % injection              Review of Systems A multi-point review of systems was asked and was negative except for the findings documented in the HPI.  Physical Exam Blood pressure 121/70, pulse 86, temperature 97.8 F (36.6 C), temperature source Oral, resp. rate 16, SpO2 100 %. CONSTITUTIONAL: NAD. EYES: Pupils are equal, round, and reactive to light, Sclera are non-icteric. EARS, NOSE, MOUTH AND THROAT: The oropharynx is clear. The oral mucosa is pink and moist. Hearing is intact to voice. LYMPH NODES:  Lymph nodes in the neck are normal. RESPIRATORY:  Lungs are clear. There is normal respiratory effort, with equal breath sounds bilaterally, and without pathologic use of accessory muscles. Healed surgical scars on the right chest and two small just beneath the right subcostal margin from her chest tubes that are well-healed.  CARDIOVASCULAR: Heart is regular without murmurs, gallops, or rubs. GI: The abdomen is soft, nontender, and nondistended. There are no palpable masses. There is no hepatosplenomegaly. There are normal bowel sounds in all quadrants.  GU: Rectal deferred.   MUSCULOSKELETAL: Normal muscle strength and tone. No cyanosis or edema.   SKIN: Turgor is good and there are no  pathologic skin lesions or ulcers. NEUROLOGIC: Motor and sensation is grossly normal. Cranial nerves are grossly intact. PSYCH:  Oriented to person, place and time. Affect is normal.  Data Reviewed U/S without gallstones, HIDA w reproduction of symptoms I have personally reviewed the patient's imaging, laboratory findings and medical records.    Assessment    RUQ pain    Plan    71 year old female with RUQ pain after eating. I discussed the procedure in detail.  The patient was given Neurosurgeon.  We discussed the risks and benefits of a laparoscopic cholecystectomy and possible cholangiogram including, but not limited to bleeding, infection, injury to surrounding structures such as the intestine or liver, bile  leak, retained gallstones, need to convert to an open procedure, prolonged diarrhea, blood clots such as  DVT, common bile duct injury, anesthesia risks, and possible need for additional procedures.  The likelihood of improvement in symptoms and return to the patient's normal status is good. We discussed the typical post-operative recovery course.  Plan for lap chole today.     Time spent with the patient was 45 minutes, with more than 50% of the time spent in face-to-face education, counseling and care coordination.     Clayburn Pert, MD FACS General Surgeon 11/23/2016, 2:21 PM

## 2016-11-23 NOTE — Anesthesia Preprocedure Evaluation (Signed)
Anesthesia Evaluation  Patient identified by MRN, date of birth, ID band Patient awake    Reviewed: Allergy & Precautions, NPO status , Patient's Chart, lab work & pertinent test results  History of Anesthesia Complications (+) PONV and history of anesthetic complications  Airway Mallampati: I  TM Distance: >3 FB Neck ROM: Full    Dental  (+) Caps   Pulmonary neg sleep apnea, neg COPD,  Hx of lung resection, does not use any inhalers or home O2   breath sounds clear to auscultation- rhonchi (-) wheezing      Cardiovascular Exercise Tolerance: Good hypertension (hx of HTN, no longer on medications and BP controlled), (-) CAD and (-) Past MI  Rhythm:Regular Rate:Normal - Systolic murmurs and - Diastolic murmurs    Neuro/Psych negative neurological ROS  negative psych ROS   GI/Hepatic Neg liver ROS, GERD  ,  Endo/Other  neg diabetesHypothyroidism   Renal/GU negative Renal ROS     Musculoskeletal negative musculoskeletal ROS (+)   Abdominal (+) - obese,   Peds  Hematology negative hematology ROS (+)   Anesthesia Other Findings Past Medical History: 10/01/2015: Cancer of lung (HCC) No date: GERD (gastroesophageal reflux disease) No date: Groin pain     Comment: Along superior/laterl R inguinal canal.  Could              be due to hernia or old scar tissue.  prev with              CT done w/o alarming findings.  Will treat               episodically.  Use tylenol #3 prn.   No date: Hyperlipidemia No date: Hypertension No date: Lipoma of thigh     Comment: Left 2014: Normal cardiac stress test No date: PONV (postoperative nausea and vomiting)     Comment: nausea after hysterectomy, and 2 days after               lung surgery 10/2015 No date: Thyroid nodule   Reproductive/Obstetrics                             Anesthesia Physical Anesthesia Plan  ASA: II  Anesthesia Plan: General    Post-op Pain Management:    Induction: Intravenous  Airway Management Planned: Oral ETT  Additional Equipment:   Intra-op Plan:   Post-operative Plan: Extubation in OR  Informed Consent: I have reviewed the patients History and Physical, chart, labs and discussed the procedure including the risks, benefits and alternatives for the proposed anesthesia with the patient or authorized representative who has indicated his/her understanding and acceptance.   Dental advisory given  Plan Discussed with: CRNA and Anesthesiologist  Anesthesia Plan Comments:         Anesthesia Quick Evaluation

## 2016-11-23 NOTE — Transfer of Care (Signed)
Immediate Anesthesia Transfer of Care Note  Patient: [name]  Procedure(s) Performed: Procedure(s): LAPAROSCOPIC CHOLECYSTECTOMY  Patient Location: PACU  Anesthesia Type:General  Level of Consciousness: sedated  Airway & Oxygen Therapy: Patient Spontanous Breathing and Patient connected to face mask oxygen  Post-op Assessment: Report given to RN and Post -op Vital signs reviewed and stable  Post vital signs: Reviewed and stable  Last Vitals:  Vitals:   11/23/16 0909 11/23/16 1620  BP: 121/70 (!) 151/76  Pulse: 86 73  Resp: 16 17  Temp: 36.6 C 36.2 C    Last Pain:  Vitals:   11/23/16 0909  TempSrc: Oral         Complications: No apparent anesthesia complications

## 2016-11-23 NOTE — Brief Op Note (Signed)
11/23/2016  4:11 PM  PATIENT:  [name]  71 y.o. female  PRE-OPERATIVE DIAGNOSIS:  Biliary Colic  POST-OPERATIVE DIAGNOSIS:  Biliary Colic  PROCEDURE:  Procedure(s): LAPAROSCOPIC CHOLECYSTECTOMY  SURGEON:  Surgeon(s) and Role:     * Clayburn Pert, MD - Primary  PHYSICIAN ASSISTANT:   ASSISTANTS: none   ANESTHESIA:   general  EBL:  Total I/O In: 800 [I.V.:800] Out: 10 [Blood:10]  BLOOD ADMINISTERED:none  DRAINS: none   LOCAL MEDICATIONS USED:  MARCAINE   , XYLOCAINE  and Amount: 19 ml  SPECIMEN:  Source of Specimen:  gallbladder  DISPOSITION OF SPECIMEN:  PATHOLOGY  COUNTS:  YES  TOURNIQUET:  * No tourniquets in log *  DICTATION: .Dragon Dictation  PLAN OF CARE: Discharge to home after PACU  PATIENT DISPOSITION:  PACU - hemodynamically stable.   Delay start of Pharmacological VTE agent (>24hrs) due to surgical blood loss or risk of bleeding: not applicable

## 2016-11-23 NOTE — Progress Notes (Signed)
Report to Ernestine Mcmurray RN.

## 2016-11-23 NOTE — Discharge Instructions (Signed)
Laparoscopic Cholecystectomy, Care After This sheet gives you information about how to care for yourself after your procedure. Your health care provider may also give you more specific instructions. If you have problems or questions, contact your health care provider. What can I expect after the procedure? After the procedure, it is common to have:  Pain at your incision sites. You will be given medicines to control this pain.  Mild nausea or vomiting.  Bloating and possible shoulder pain from the air-like gas that was used during the procedure. Follow these instructions at home: Incision care  Follow instructions from your health care provider about how to take care of your incisions. Make sure you:  Wash your hands with soap and water before you change your bandage (dressing). If soap and water are not available, use hand sanitizer.  Change your dressing after 48 hours. Replace the dressing with a large Band-Aid until there is no drainage present on the dressing.  Leave stitches (sutures), skin glue, or adhesive strips in place. These skin closures may need to be in place for 2 weeks or longer. If adhesive strip edges start to loosen and curl up, you may trim the loose edges. Do not remove adhesive strips completely unless your health care provider tells you to do that.  Do not take baths, swim, or use a hot tub until your health care provider approves. Okay to shower in 24 hours.  Check your incision area every day for signs of infection. Check for:  More redness, swelling, or pain.  More fluid or blood.  Warmth.  Pus or a bad smell. Activity  Do not drive or use heavy machinery while taking prescription pain medicine.  Do not lift anything that is heavier than 10 lb (4.5 kg) until your health care provider approves.  Do not play contact sports until your health care provider approves.  Do not drive for 24 hours if you were given a medicine to help you relax  (sedative).  Rest as needed. Do not return to work or school until your health care provider approves. General instructions  Take over-the-counter and prescription medicines only as told by your health care provider.  To prevent or treat constipation while you are taking prescription pain medicine, your health care provider may recommend that you:  Drink enough fluid to keep your urine clear or pale yellow.  Take over-the-counter or prescription medicines.  Eat foods that are high in fiber, such as fresh fruits and vegetables, whole grains, and beans.  Limit foods that are high in fat and processed sugars, such as fried and sweet foods. Contact a health care provider if:  You develop a rash.  You have more redness, swelling, or pain around your incisions.  You have more fluid or blood coming from your incisions.  Your incisions feel warm to the touch.  You have pus or a bad smell coming from your incisions.  You have a fever.  One or more of your incisions breaks open. Get help right away if:  You have trouble breathing.  You have chest pain.  You have increasing pain in your shoulders.  You faint or feel dizzy when you stand.  You have severe pain in your abdomen.  You have nausea or vomiting that lasts for more than one day.  You have leg pain. This information is not intended to replace advice given to you by your health care provider. Make sure you discuss any questions you have with your health care  provider. Document Released: 10/17/2005 Document Revised: 05/07/2016 Document Reviewed: 04/04/2016 Elsevier Interactive Patient Education  2017 [employer] Anesthesia, Adult, Care After These instructions provide you with information about caring for yourself after your procedure. Your health care provider may also give you more specific instructions. Your treatment has been planned according to current medical practices, but problems sometimes occur.  Call your health care provider if you have any problems or questions after your procedure. What can I expect after the procedure? After the procedure, it is common to have:  Vomiting.  A sore throat.  Mental slowness. It is common to feel:  Nauseous.  Cold or shivery.  Sleepy.  Tired.  Sore or achy, even in parts of your body where you did not have surgery. Follow these instructions at home: For at least 24 hours after the procedure:  Do not:  Participate in activities where you could fall or become injured.  Drive.  Use heavy machinery.  Drink alcohol.  Take sleeping pills or medicines that cause drowsiness.  Make important decisions or sign legal documents.  Take care of children on your own.  Rest. Eating and drinking  If you vomit, drink water, juice, or soup when you can drink without vomiting.  Drink enough fluid to keep your urine clear or pale yellow.  Make sure you have little or no nausea before eating solid foods.  Follow the diet recommended by your health care provider. General instructions  Have a responsible adult stay with you until you are awake and alert.  Return to your normal activities as told by your health care provider. Ask your health care provider what activities are safe for you.  Take over-the-counter and prescription medicines only as told by your health care provider.  If you smoke, do not smoke without supervision.  Keep all follow-up visits as told by your health care provider. This is important. Contact a health care provider if:  You continue to have nausea or vomiting at home, and medicines are not helpful.  You cannot drink fluids or start eating again.  You cannot urinate after 8-12 hours.  You develop a skin rash.  You have fever.  You have increasing redness at the site of your procedure. Get help right away if:  You have difficulty breathing.  You have chest pain.  You have unexpected  bleeding.  You feel that you are having a life-threatening or urgent problem. This information is not intended to replace advice given to you by your health care provider. Make sure you discuss any questions you have with your health care provider. Document Released: 01/23/2001 Document Revised: 03/21/2016 Document Reviewed: 10/01/2015 Elsevier Interactive Patient Education  2017 [employer].

## 2016-11-24 ENCOUNTER — Encounter: Payer: Self-pay | Admitting: General Surgery

## 2016-11-24 ENCOUNTER — Ambulatory Visit: Payer: PPO | Admitting: Internal Medicine

## 2016-11-24 NOTE — Anesthesia Postprocedure Evaluation (Signed)
Anesthesia Post Note  Patient: [name]  Procedure(s) Performed: Procedure(s): LAPAROSCOPIC CHOLECYSTECTOMY  Patient location during evaluation: PACU Anesthesia Type: General Level of consciousness: awake and alert Pain management: pain level controlled Vital Signs Assessment: post-procedure vital signs reviewed and stable Respiratory status: spontaneous breathing, nonlabored ventilation, respiratory function stable and patient connected to nasal cannula oxygen Cardiovascular status: blood pressure returned to baseline and stable Postop Assessment: no signs of nausea or vomiting Anesthetic complications: no     Last Vitals:  Vitals:   11/23/16 1723 11/23/16 1752  BP: 126/64 (!) 107/55  Pulse: 72 67  Resp: 14 14  Temp: 36.5 C     Last Pain:  Vitals:   11/23/16 1800  TempSrc:   PainSc: [place]

## 2016-11-25 LAB — SURGICAL PATHOLOGY

## 2016-12-01 ENCOUNTER — Ambulatory Visit (INDEPENDENT_AMBULATORY_CARE_PROVIDER_SITE_OTHER): Payer: PPO | Admitting: General Surgery

## 2016-12-01 ENCOUNTER — Encounter: Payer: Self-pay | Admitting: General Surgery

## 2016-12-01 VITALS — BP 146/81 | HR 90 | Temp 97.8°F | Wt 115.0 lb

## 2016-12-01 DIAGNOSIS — Z4889 Encounter for other specified surgical aftercare: Secondary | ICD-10-CM

## 2016-12-01 MED ORDER — HYDROCODONE-ACETAMINOPHEN 5-325 MG PO TABS: 1.0000 | ORAL_TABLET | Freq: Four times a day (QID) | ORAL | 0 refills | Status: DC | PRN

## 2016-12-01 NOTE — Progress Notes (Signed)
Outpatient Surgical Follow Up  12/01/2016  [name] is an 71 y.o. female.   Chief Complaint  Patient presents with  . Routine Post Op    Laparoscopic Cholecystectomy (11/23/16)- Dr. Adonis Huguenin    HPI: 71 year old female returns to clinic for follow-up 1 week status post laparoscopic cholecystectomy. Patient reports soreness to her upper midline incision site but otherwise her appetite has returned and she is having normal bowel function. She is still requiring pain medications especially when she tries to sleep on her side. She denies any fevers, chills, nausea, vomiting, chest pain, shortness of breath.  Past Medical History:  Diagnosis Date  . Cancer of lung ([place]) 10/01/2015  . GERD (gastroesophageal reflux disease)   . Groin pain    Along superior/laterl R inguinal canal.  Could be due to hernia or old scar tissue.  prev with CT done w/o alarming findings.  Will treat episodically.  Use tylenol #3 prn.    . Hyperlipidemia   . Hypertension   . Lipoma of thigh    Left  . Normal cardiac stress test 2014  . PONV (postoperative nausea and vomiting)    nausea after hysterectomy, and 2 days after lung surgery 10/2015  . Thyroid nodule     Past Surgical History:  Procedure Laterality Date  . ABDOMINAL HYSTERECTOMY  12-30-08  . APPENDECTOMY  11/23/2016   Dr. Adonis Huguenin  . BREAST MASS EXCISION  1977   benign  . CARPAL TUNNEL RELEASE Right 1978  . CHOLECYSTECTOMY  11/23/2016   Procedure: LAPAROSCOPIC CHOLECYSTECTOMY;  Surgeon: Clayburn Pert, MD;  Location: [facility] ORS;  Service: General;;  . COLONOSCOPY  2009  . DILATION AND CURETTAGE OF UTERUS  2001  . ELECTROMAGNETIC NAVIGATION BROCHOSCOPY N/A 08/25/2015   Procedure: ELECTROMAGNETIC NAVIGATION BRONCHOSCOPY;  Surgeon: Flora Lipps, MD;  Location: [facility] OR[facility];  Service: Cardiopulmonary;  Laterality: N/A;  . ENDOBRONCHIAL ULTRASOUND N/A 08/25/2015   Procedure: ENDOBRONCHIAL ULTRASOUND;  Surgeon: Flora Lipps, MD;  Location: [facility] OR[facility];  Service:  Cardiopulmonary;  Laterality: N/A;  . ESOPHAGOGASTRODUODENOSCOPY  2002, 11/15/07   Normal (Dr. Allyn Kenner)  . FACIAL COSMETIC SURGERY  01/13/10   mini facelift  . HERNIA REPAIR  1976  . THORACOTOMY/LOBECTOMY Right 10/27/2015   Procedure: THORACOTOMY/LOBECTOMY;  Surgeon: Nestor Lewandowsky, MD;  Location: [facility] OR[facility];  Service: Thoracic;  Laterality: Right;  . THYROIDECTOMY N/A 12/29/2015   Procedure: THYROIDECTOMY;  Surgeon: Beverly Gust, MD;  Location: [facility] OR[facility];  Service: ENT;  Laterality: N/A;  . TOTAL VAGINAL HYSTERECTOMY  12/30/08   for fibroid pain (Dr. Gertie Fey)    Family History  Problem Relation Age of Onset  . Transient ischemic attack Mother   . Hypertension Mother   . Stroke Mother     mini strokes  . Hypertension Sister   . Cancer Sister     ovarian  . Hypertension Sister   . Hypertension Sister   . Hypertension Sister   . Breast cancer Neg Hx   . Colon cancer Neg Hx     Social History:  reports that she has never smoked. She has never used smokeless tobacco. She reports that she does not drink alcohol or use drugs.  Allergies:  Allergies  Allergen Reactions  . Atorvastatin     REACTION: myalgias  . Ciprofloxacin     REACTION: increase reflux  . Epinephrine     Heart racing with dental injection  . Metoprolol Succinate Swelling    Swelling and stiffness in hands and ankles  . Simvastatin Other (See Comments)  Muscle aches and stiffness  . Sulfonamide Derivatives     REACTION: itching    Medications reviewed.    ROS A multipoint review of systems was completed. All pertinent positives and negatives are documented in the history of present illness and remainder are negative.   BP (!) 146/81   Pulse 90   Temp 97.8 F (36.6 C) (Oral)   Wt 52.2 kg (115 lb)   BMI 19.74 kg/m   Physical Exam Gen.: No acute distress Chest: Clear to auscultation Heart: Regular rhythm Abdomen: Soft, appropriately tender at her incision sites, nondistended.    No results  found for this or any previous visit (from the past 48 hour(s)). No results found.  Assessment/Plan:  1. Aftercare following surgery 71 year old female one week status post laparoscopic cholecystectomy. Doing well. Again described the anticipated recovery time period. Pathology reviewed. Patient will follow up on an as-needed basis.     Clayburn Pert, MD FACS General Surgeon  12/01/2016,1:15 PM

## 2016-12-01 NOTE — Patient Instructions (Addendum)
Please call our office with any questions or concerns.  Please do not submerge in a tub, hot tub, or pool until incisions are completely sealed.  Use sun block to incision area over the next year if this area will be exposed to sun. This helps decrease scarring.  At this time- Listen to your body when lifting, if you have pain when lifting, stop and then try again in a few days. Pain after doing exercises or activities of daily living is normal as you get back in to your normal routine.  If you develop redness, drainage, or pain at incision sites- call our office immediately and speak with a nurse.

## 2017-03-20 ENCOUNTER — Emergency Department: Payer: PPO

## 2017-03-20 ENCOUNTER — Encounter: Payer: Self-pay | Admitting: Emergency Medicine

## 2017-03-20 ENCOUNTER — Observation Stay
Admission: EM | Admit: 2017-03-20 | Discharge: 2017-03-21 | Disposition: A | Discharge status: Home / Self Care | Payer: PPO | Attending: Specialist | Admitting: Specialist

## 2017-03-20 ENCOUNTER — Other Ambulatory Visit: Payer: Self-pay

## 2017-03-20 DIAGNOSIS — E039 Hypothyroidism, unspecified: Secondary | ICD-10-CM | POA: Diagnosis not present

## 2017-03-20 DIAGNOSIS — Z881 Allergy status to other antibiotic agents status: Secondary | ICD-10-CM | POA: Insufficient documentation

## 2017-03-20 DIAGNOSIS — Z9049 Acquired absence of other specified parts of digestive tract: Secondary | ICD-10-CM | POA: Insufficient documentation

## 2017-03-20 DIAGNOSIS — K219 Gastro-esophageal reflux disease without esophagitis: Secondary | ICD-10-CM | POA: Insufficient documentation

## 2017-03-20 DIAGNOSIS — I10 Essential (primary) hypertension: Secondary | ICD-10-CM | POA: Insufficient documentation

## 2017-03-20 DIAGNOSIS — Z85850 Personal history of malignant neoplasm of thyroid: Secondary | ICD-10-CM | POA: Diagnosis not present

## 2017-03-20 DIAGNOSIS — Z79899 Other long term (current) drug therapy: Secondary | ICD-10-CM | POA: Diagnosis not present

## 2017-03-20 DIAGNOSIS — Z9889 Other specified postprocedural states: Secondary | ICD-10-CM | POA: Insufficient documentation

## 2017-03-20 DIAGNOSIS — Z90710 Acquired absence of both cervix and uterus: Secondary | ICD-10-CM | POA: Insufficient documentation

## 2017-03-20 DIAGNOSIS — Z85118 Personal history of other malignant neoplasm of bronchus and lung: Secondary | ICD-10-CM | POA: Diagnosis not present

## 2017-03-20 DIAGNOSIS — R079 Chest pain, unspecified: Secondary | ICD-10-CM | POA: Diagnosis present

## 2017-03-20 DIAGNOSIS — J984 Other disorders of lung: Secondary | ICD-10-CM | POA: Insufficient documentation

## 2017-03-20 DIAGNOSIS — E890 Postprocedural hypothyroidism: Secondary | ICD-10-CM | POA: Insufficient documentation

## 2017-03-20 DIAGNOSIS — Z882 Allergy status to sulfonamides status: Secondary | ICD-10-CM | POA: Insufficient documentation

## 2017-03-20 DIAGNOSIS — E7800 Pure hypercholesterolemia, unspecified: Secondary | ICD-10-CM | POA: Insufficient documentation

## 2017-03-20 DIAGNOSIS — R0789 Other chest pain: Principal | ICD-10-CM | POA: Insufficient documentation

## 2017-03-20 DIAGNOSIS — D696 Thrombocytopenia, unspecified: Secondary | ICD-10-CM | POA: Insufficient documentation

## 2017-03-20 DIAGNOSIS — Z888 Allergy status to other drugs, medicaments and biological substances status: Secondary | ICD-10-CM | POA: Diagnosis not present

## 2017-03-20 DIAGNOSIS — E785 Hyperlipidemia, unspecified: Secondary | ICD-10-CM | POA: Diagnosis not present

## 2017-03-20 DIAGNOSIS — M8580 Other specified disorders of bone density and structure, unspecified site: Secondary | ICD-10-CM | POA: Diagnosis not present

## 2017-03-20 LAB — COMPREHENSIVE METABOLIC PANEL
ALT: 34 U/L (ref 14–54)
AST: 28 U/L (ref 15–41)
Albumin: 3.9 g/dL (ref 3.5–5.0)
Alkaline Phosphatase: 43 U/L (ref 38–126)
Anion gap: 5 (ref 5–15)
BUN: 18 mg/dL (ref 6–20)
CO2: 29 mmol/L (ref 22–32)
Calcium: 9 mg/dL (ref 8.9–10.3)
Chloride: 106 mmol/L (ref 101–111)
Creatinine, Ser: 0.66 mg/dL (ref 0.44–1.00)
GFR calc Af Amer: >60 mL/min (ref >60)
GFR calc non Af Amer: >60 mL/min (ref >60)
Glucose, Bld: 111 mg/dL — ABNORMAL HIGH (ref 65–99)
Potassium: 3.9 mmol/L (ref 3.5–5.1)
Sodium: 140 mmol/L (ref 135–145)
Total Bilirubin: 0.5 mg/dL (ref 0.3–1.2)
Total Protein: 6.4 g/dL — ABNORMAL LOW (ref 6.5–8.1)

## 2017-03-20 LAB — CBC
HCT: 40.7 % (ref 35.0–47.0)
Hemoglobin: 13.8 g/dL (ref 12.0–16.0)
MCH: 30.2 pg (ref 26.0–34.0)
MCHC: 33.9 g/dL (ref 32.0–36.0)
MCV: 89.1 fL (ref 80.0–100.0)
Platelets: 135 10*3/uL — ABNORMAL LOW (ref 150–440)
RBC: 4.57 MIL/uL (ref 3.80–5.20)
RDW: 14.6 % — ABNORMAL HIGH (ref 11.5–14.5)
WBC: 6.6 10*3/uL (ref 3.6–11.0)

## 2017-03-20 LAB — LIPASE, BLOOD: Lipase: 30 U/L (ref 11–51)

## 2017-03-20 LAB — TROPONIN I
Troponin I: <0.03 ng/mL (ref <0.03)
Troponin I: <0.03 ng/mL (ref <0.03)
Troponin I: <0.03 ng/mL (ref <0.03)

## 2017-03-20 LAB — TSH: TSH: 0.266 u[IU]/mL — ABNORMAL LOW (ref 0.350–4.500)

## 2017-03-20 MED ORDER — BIOTIN 1000 MCG PO TABS: 1000.0000 ug | ORAL_TABLET | Freq: Every day | ORAL | Status: DC

## 2017-03-20 MED ORDER — HYDROCODONE-ACETAMINOPHEN 5-325 MG PO TABS: 1.0000 | ORAL_TABLET | Freq: Four times a day (QID) | ORAL | Status: DC | PRN

## 2017-03-20 MED ORDER — ONDANSETRON HCL 4 MG/2ML IJ SOLN: 4.0000 mg | Freq: Four times a day (QID) | INTRAMUSCULAR | Status: DC | PRN

## 2017-03-20 MED ORDER — ASPIRIN 81 MG PO CHEW
324.0000 mg | CHEWABLE_TABLET | Freq: Once | ORAL | Status: AC
  Administered 2017-03-20: 324 mg via ORAL
  Filled 2017-03-20: qty 4

## 2017-03-20 MED ORDER — OMEGA-3-ACID ETHYL ESTERS 1 G PO CAPS: 1000.0000 mg | ORAL_CAPSULE | Freq: Every day | ORAL | Status: DC

## 2017-03-20 MED ORDER — LUTEIN 20 MG PO CAPS: 20.0000 mg | ORAL_CAPSULE | Freq: Every day | ORAL | Status: DC

## 2017-03-20 MED ORDER — COQ10 200 MG PO CAPS: 200.0000 mg | ORAL_CAPSULE | Freq: Every day | ORAL | Status: DC

## 2017-03-20 MED ORDER — PANTOPRAZOLE SODIUM 40 MG PO TBEC
40.0000 mg | DELAYED_RELEASE_TABLET | Freq: Every day | ORAL | Status: DC
  Administered 2017-03-21: 40 mg via ORAL
  Filled 2017-03-20: qty 1

## 2017-03-20 MED ORDER — VITAMIN D 1000 UNITS PO TABS
1000.0000 [IU] | ORAL_TABLET | Freq: Every day | ORAL | Status: DC
Start: 2017-03-21 — End: 2017-03-21

## 2017-03-20 MED ORDER — LEVOTHYROXINE SODIUM 50 MCG PO TABS: 100.0000 ug | ORAL_TABLET | Freq: Every day | ORAL | Status: DC

## 2017-03-20 MED ORDER — FA-VITAMIN B-6-VITAMIN B-12 2.2-25-0.5 MG PO TABS: ORAL_TABLET | Freq: Every day | ORAL | Status: DC

## 2017-03-20 MED ORDER — ASPIRIN EC 81 MG PO TBEC
81.0000 mg | DELAYED_RELEASE_TABLET | Freq: Every day | ORAL | Status: DC
  Administered 2017-03-21: 81 mg via ORAL
  Filled 2017-03-20: qty 1

## 2017-03-20 MED ORDER — GI COCKTAIL ~~LOC~~
30.0000 mL | Freq: Four times a day (QID) | ORAL | Status: DC | PRN
  Filled 2017-03-20: qty 30

## 2017-03-20 MED ORDER — VITAMIN C 500 MG PO TABS
2000.0000 mg | ORAL_TABLET | Freq: Every day | ORAL | Status: DC
  Filled 2017-03-20: qty 4

## 2017-03-20 MED ORDER — ENOXAPARIN SODIUM 40 MG/0.4ML ~~LOC~~ SOLN
40.0000 mg | SUBCUTANEOUS | Status: DC
  Administered 2017-03-20: 40 mg via SUBCUTANEOUS
  Filled 2017-03-20: qty 0.4

## 2017-03-20 MED ORDER — MORPHINE SULFATE (PF) 2 MG/ML IV SOLN: 2.0000 mg | INTRAVENOUS | Status: DC | PRN

## 2017-03-20 MED ORDER — RENA-VITE PO TABS: 1.0000 | ORAL_TABLET | Freq: Every day | ORAL | Status: DC

## 2017-03-20 MED ORDER — NITROGLYCERIN 0.4 MG SL SUBL
0.4000 mg | SUBLINGUAL_TABLET | SUBLINGUAL | Status: DC | PRN
Start: 2017-03-20 — End: 2017-03-21

## 2017-03-20 MED ORDER — POTASSIUM GLUCONATE 595 MG PO CAPS: 595.0000 mg | ORAL_CAPSULE | Freq: Every day | ORAL | Status: DC

## 2017-03-20 MED ORDER — FOLIC ACID 1 MG PO TABS
1.0000 mg | ORAL_TABLET | Freq: Every day | ORAL | Status: DC
  Administered 2017-03-21: 1 mg via ORAL
  Filled 2017-03-20: qty 1

## 2017-03-20 MED ORDER — HYDROCHLOROTHIAZIDE 12.5 MG PO CAPS
12.5000 mg | ORAL_CAPSULE | Freq: Every day | ORAL | Status: DC
  Filled 2017-03-20: qty 1

## 2017-03-20 MED ORDER — LEVOTHYROXINE SODIUM 100 MCG PO TABS
100.0000 ug | ORAL_TABLET | Freq: Every day | ORAL | Status: DC
  Administered 2017-03-21: 100 ug via ORAL
  Filled 2017-03-20: qty 1

## 2017-03-20 MED ORDER — ACETAMINOPHEN 325 MG PO TABS
650.0000 mg | ORAL_TABLET | ORAL | Status: DC | PRN
  Administered 2017-03-20: 650 mg via ORAL
  Filled 2017-03-20: qty 2

## 2017-03-20 MED ORDER — PRAVASTATIN SODIUM 40 MG PO TABS
40.0000 mg | ORAL_TABLET | Freq: Every day | ORAL | Status: DC
  Administered 2017-03-20: 40 mg via ORAL
  Filled 2017-03-20 (×2): qty 1

## 2017-03-20 NOTE — ED Triage Notes (Signed)
Chest pain this am at exercise class, last approx 4 min, no pain now.

## 2017-03-20 NOTE — H&P (Addendum)
[facility] [place] NAME: [name]    MR#:  [id number]  DATE OF BIRTH:  1946-08-13  DATE OF ADMISSION:  03/20/2017  PRIMARY CARE PHYSICIAN: Tonia Ghent, MD   REQUESTING/REFERRING PHYSICIAN: Schuyler Amor, MD  CHIEF COMPLAINT:   chestpain HISTORY OF PRESENT ILLNESS:  [name]  is a 72 y.o. female with a known history of Hyperlipidemia, hypertension, currently hypothyroid status post thyroid resection for thyroid cancer, history of lung cancer status post resection of the lung lobe is presented to the ED with a chief complaint of chest pain. Patient is reporting intermittent episodes of chest pain for the past few weeks radiating to the left jaw and left shoulder. Patient was seen by [facility] cardiology Dr. Saralyn Pilar 6 years ago and had stress test done which was normal. Initial troponin negative EKG with no acute changes  PAST MEDICAL HISTORY:   Past Medical History:  Diagnosis Date  . Cancer of lung ([place]) 10/01/2015  . GERD (gastroesophageal reflux disease)   . Groin pain    Along superior/laterl R inguinal canal.  Could be due to hernia or old scar tissue.  prev with CT done w/o alarming findings.  Will treat episodically.  Use tylenol #3 prn.    . Hyperlipidemia   . Hypertension   . Lipoma of thigh    Left  . Normal cardiac stress test 2014  . PONV (postoperative nausea and vomiting)    nausea after hysterectomy, and 2 days after lung surgery 10/2015  . Thyroid nodule     PAST SURGICAL HISTOIRY:   Past Surgical History:  Procedure Laterality Date  . ABDOMINAL HYSTERECTOMY  12-30-08  . APPENDECTOMY  11/23/2016   Dr. Adonis Huguenin  . BREAST MASS EXCISION  1977   benign  . CARPAL TUNNEL RELEASE Right 1978  . CHOLECYSTECTOMY  11/23/2016   Procedure: LAPAROSCOPIC CHOLECYSTECTOMY;  Surgeon: Clayburn Pert, MD;  Location: [facility] ORS;  Service: General;;  . COLONOSCOPY  2009  . DILATION AND CURETTAGE OF UTERUS  2001   . ELECTROMAGNETIC NAVIGATION BROCHOSCOPY N/A 08/25/2015   Procedure: ELECTROMAGNETIC NAVIGATION BRONCHOSCOPY;  Surgeon: Flora Lipps, MD;  Location: [facility] ORS;  Service: Cardiopulmonary;  Laterality: N/A;  . ENDOBRONCHIAL ULTRASOUND N/A 08/25/2015   Procedure: ENDOBRONCHIAL ULTRASOUND;  Surgeon: Flora Lipps, MD;  Location: [facility] ORS;  Service: Cardiopulmonary;  Laterality: N/A;  . ESOPHAGOGASTRODUODENOSCOPY  2002, 11/15/07   Normal (Dr. Allyn Kenner)  . FACIAL COSMETIC SURGERY  01/13/10   mini facelift  . HERNIA REPAIR  1976  . THORACOTOMY/LOBECTOMY Right 10/27/2015   Procedure: THORACOTOMY/LOBECTOMY;  Surgeon: Nestor Lewandowsky, MD;  Location: [facility] ORS;  Service: Thoracic;  Laterality: Right;  . THYROIDECTOMY N/A 12/29/2015   Procedure: THYROIDECTOMY;  Surgeon: Beverly Gust, MD;  Location: [facility] ORS;  Service: ENT;  Laterality: N/A;  . TOTAL VAGINAL HYSTERECTOMY  12/30/08   for fibroid pain (Dr. Gertie Fey)    SOCIAL HISTORY:   Social History  Substance Use Topics  . Smoking status: Never Smoker  . Smokeless tobacco: Never Used  . Alcohol use No    FAMILY HISTORY:   Family History  Problem Relation Age of Onset  . Transient ischemic attack Mother   . Hypertension Mother   . Stroke Mother        mini strokes  . Hypertension Sister   . Cancer Sister        ovarian  . Hypertension Sister   . Hypertension Sister   . Hypertension Sister   .  Breast cancer Neg Hx   . Colon cancer Neg Hx     DRUG ALLERGIES:   Allergies  Allergen Reactions  . Atorvastatin     REACTION: myalgias  . Ciprofloxacin     REACTION: increase reflux  . Epinephrine     Heart racing with dental injection  . Metoprolol Succinate Swelling    Swelling and stiffness in hands and ankles  . Simvastatin Other (See Comments)    Muscle aches and stiffness  . Sulfonamide Derivatives     REACTION: itching    REVIEW OF SYSTEMS:  CONSTITUTIONAL: No fever, fatigue or weakness.  EYES: No blurred or double vision.  EARS,  NOSE, AND THROAT: No tinnitus or ear pain.  RESPIRATORY: No cough, shortness of breath, wheezing or hemoptysis.  CARDIOVASCULAR: No chest pain, orthopnea, edema.  GASTROINTESTINAL: No nausea, vomiting, diarrhea or abdominal pain.  GENITOURINARY: No dysuria, hematuria.  ENDOCRINE: No polyuria, nocturia,  HEMATOLOGY: No anemia, easy bruising or bleeding SKIN: No rash or lesion. MUSCULOSKELETAL: No joint pain or arthritis.   NEUROLOGIC: No tingling, numbness, weakness.  PSYCHIATRY: No anxiety or depression.   MEDICATIONS AT HOME:   Prior to Admission medications   Medication Sig Start Date End Date Taking? Authorizing Provider  Ascorbic Acid (VITAMIN C) 500 MG tablet Take 2,000 mg by mouth daily. Reported on 02/04/2016   Yes [provider]  Biotin (PA BIOTIN) 1000 MCG tablet Take 1,000 mcg by mouth daily. Reported on 02/04/2016   Yes [provider]  Cholecalciferol (VITAMIN D-3) 1000 units CAPS Take 1 capsule by mouth daily with supper.    Yes [provider]  Coenzyme Q10 (COQ10) 200 MG CAPS Take 200 mg by mouth daily with supper.   Yes [provider]  Folic Acid-Vit B3-ZHG D92 (FA-VITAMIN B-6-VITAMIN B-12 PO) Take 1 tablet by mouth daily after breakfast. Reported on 02/04/2016   Yes [provider]  levothyroxine (SYNTHROID, LEVOTHROID) 100 MCG tablet TAKE 1 TABLET (100 MCG TOTAL) BY MOUTH DAILY BEFORE BREAKFAST. 11/14/16  Yes Cammie Sickle, MD  Lutein 20 MG CAPS Take 20 mg by mouth daily.   Yes [provider]  Omega-3 Fatty Acids (SUPER OMEGA 3 EPA/DHA) 1000 MG CAPS Take 1,000 mg by mouth daily with supper.   Yes [provider]  omeprazole (PRILOSEC) 40 MG capsule Take 1 capsule (40 mg total) by mouth daily. 05/24/16  Yes Tonia Ghent, MD  OVER THE COUNTER MEDICATION Take 1 tablet by mouth daily with supper. calcium 500 mg and K 40 mcg   Yes [provider]  Potassium Gluconate 595 MG CAPS Take 595 mg by mouth  daily.    Yes [provider]  pravastatin (PRAVACHOL) 40 MG tablet Take 1 tablet (40 mg total) by mouth daily. Patient taking differently: Take 40 mg by mouth at bedtime.  05/24/16  Yes Tonia Ghent, MD  HYDROcodone-acetaminophen (NORCO) 5-325 MG tablet Take 1 tablet by mouth every 6 (six) hours as needed for moderate pain. 12/01/16   Clayburn Pert, MD      VITAL SIGNS:  Blood pressure (!) 171/85, pulse 89, temperature 97.6 F (36.4 C), temperature source Oral, resp. rate 14, height '5\' 4"'$  (1.626 m), weight 54.4 kg (120 lb), SpO2 91 %.  PHYSICAL EXAMINATION:  GENERAL:  71 y.o.-year-old patient lying in the bed with no acute distress.  EYES: Pupils equal, round, reactive to light and accommodation. No scleral icterus. Extraocular muscles intact.  HEENT: Head atraumatic, normocephalic. Oropharynx and nasopharynx  clear.  NECK:  Supple, no jugular venous distention. No thyroid enlargement, no tenderness.  LUNGS: Normal breath sounds bilaterally, no wheezing, rales,rhonchi or crepitation. No use of accessory muscles of respiration.  CARDIOVASCULAR: S1, S2 normal. No murmurs, rubs, or gallops.  ABDOMEN: Soft, nontender, nondistended. Bowel sounds present. No organomegaly or mass.  EXTREMITIES: No pedal edema, cyanosis, or clubbing.  NEUROLOGIC: Cranial nerves II through XII are intact. Muscle strength 5/5 in all extremities. Sensation intact. Gait not checked.  PSYCHIATRIC: The patient is alert and oriented x 3.  SKIN: No obvious rash, lesion, or ulcer.   LABORATORY PANEL:   CBC  Recent Labs Lab 03/20/17 1251  WBC 6.6  HGB 13.8  HCT 40.7  PLT 135*   ------------------------------------------------------------------------------------------------------------------  Chemistries   Recent Labs Lab 03/20/17 1251  NA 140  K 3.9  CL 106  CO2 29  GLUCOSE 111*  BUN 18  CREATININE 0.66  CALCIUM 9.0  AST 28  ALT 34  ALKPHOS 43  BILITOT 0.5    ------------------------------------------------------------------------------------------------------------------  Cardiac Enzymes  Recent Labs Lab 03/20/17 1251  TROPONINI <0.03   ------------------------------------------------------------------------------------------------------------------  RADIOLOGY:  Dg Chest 2 View  Result Date: 03/20/2017 CLINICAL DATA:  Chest pain history of lung cancer EXAM: CHEST  2 VIEW COMPARISON:  CT 11/15/2016, radiograph 08/12/2016 FINDINGS: Stable lobectomy changes and scarring at the right base. Left lung is clear. No focal consolidation or pleural effusion. Stable cardiomediastinal silhouette with mild atherosclerosis. No pneumothorax. Surgical clips in the right upper quadrant. IMPRESSION: Stable postsurgical changes and scarring in the right lung base. No radiographic evidence for acute cardiopulmonary abnormality. Electronically Signed   By: Donavan Foil M.D.   On: 03/20/2017 16:04    EKG:   Orders placed or performed during the hospital encounter of 03/20/17  . ED EKG within 10 minutes  . ED EKG within 10 minutes    IMPRESSION AND PLAN:   [name]  is a 71 y.o. female with a known history of Hyperlipidemia, hypertension, currently hypothyroid status post thyroid resection for thyroid cancer, history of lung cancer status post resection of the lung lobe is presented to the ED with a chief complaint of chest pain. Patient is reporting intermittent episodes of chest pain for the past few weeks radiating to the left jaw and left shoulder.   # Chest pain rule out acute MI Admit patient to telemetry Cycle cardiac biomarkers, initial troponin negative Myoview stress test in a.m. if she rules out acute MI to be read by [facility] cardiology as patient was seen by Dr. Lorinda Creed in the past Aspirin, nitroglycerin as needed, morphine IV for severe pain and statin Patient is allergic to metoprolol  #Hypertension agent is not on any home medications  allergic to metoprolol will start her on hydrochlorothiazide and titrate as needed  #Hyperlipidemia check fasting lipid panel and continue statin  #GERD PPI  #History of thyroid cancer status post thyroidectomy On synthyroid check TSH  #History of lung cancer status post  lung lobe resection Outpatient follow-up with oncology as recommended    GI prophylaxis with Protonix and DVT prophylaxis with Lovenox subcutaneous   All the records are reviewed and case discussed with ED provider. Management plans discussed with the patient, family and they are in agreement.  CODE STATUS: full code, husband and sister or healthcare power of attorney  TOTAL TIME TAKING CARE OF THIS PATIENT: 45 minutes.   Note: This dictation was prepared with Dragon dictation along with smaller phrase technology. Any transcriptional errors  that result from this process are unintentional.  Nicholes Mango M.D on 03/20/2017 at 5:21 PM  Between 7am to 6pm - Pager - [phone]  After 6pm go to [email or web address] - password [email or web address] [place] [facility]  Office  [phone]  CC: Primary care physician; Tonia Ghent, MD

## 2017-03-20 NOTE — ED Notes (Signed)
Dr. Burlene Arnt in room to assess patient.  Will continue to monitor.

## 2017-03-20 NOTE — ED Notes (Signed)
Patient reports she has had occasional chest pain x 2 weeks and today chest pain occurred while patient was exercising.  Patient reports slight shortness of breath at the same time as the chest pain.  Patient denies any chest pain at this time.  Patient is in no obvious distress at this time.  Patient reports her husband had a heart attack earlier this year.

## 2017-03-20 NOTE — Progress Notes (Addendum)
PHARMACIST - PHYSICIAN ORDER COMMUNICATION  CONCERNING: P&T Medication Policy on Herbal Medications  DESCRIPTION:  This patient's order for:  Biotin, CoQ10, Lutein, Potassium gluconate  has been noted.  This product(s) is classified as an "herbal" or natural product. Due to a lack of definitive safety studies or FDA approval, nonstandard manufacturing practices, plus the potential risk of unknown drug-drug interactions while on inpatient medications, the Pharmacy and Therapeutics Committee does not permit the use of "herbal" or natural products of this type within [facility].   ACTION TAKEN: The pharmacy department is unable to verify this order at this time and your patient has been informed of this safety policy. Please reevaluate patient's clinical condition at discharge and address if the herbal or natural product(s) should be resumed at that time.

## 2017-03-20 NOTE — ED Provider Notes (Signed)
[facility] Emergency Department Provider Note  ____________________________________________   I have reviewed the triage vital signs and the nursing notes.   HISTORY  Chief Complaint Chest Pain    HPI [name] is a 71 y.o. female with a history of thyroid issues, hypercholesterolemia, presents today complaining of chest pain in the left chest. She's had it a few times over the last couple weeks. Once it was her left jaw once her left arm. This morning she has been eating a aerobic exercise and she began to have chest pain in her left chest, she cannot describe it otherwise besides saying he is a "pain". She feels it was somewhat significant. She cannot give a number to it. It was nonradiating. It lasted for a few minutes and went away after she stopped exercising. There was no associated symptoms. No shortness of breath, no pleuritic chest pain. She's had no dyspnea on exertion or leg swelling. She has no history of PE or DVT. She denies ongoing pain. Her last stress test was several years ago.   Past Medical History:  Diagnosis Date  . Cancer of lung ([place]) 10/01/2015  . GERD (gastroesophageal reflux disease)   . Groin pain    Along superior/laterl R inguinal canal.  Could be due to hernia or old scar tissue.  prev with CT done w/o alarming findings.  Will treat episodically.  Use tylenol #3 prn.    . Hyperlipidemia   . Hypertension   . Lipoma of thigh    Left  . Normal cardiac stress test 2014  . PONV (postoperative nausea and vomiting)    nausea after hysterectomy, and 2 days after lung surgery 10/2015  . Thyroid nodule     Patient Active Problem List   Diagnosis Date Noted  . Biliary colic 78/[id number]/8502  . Sebaceous cyst 05/10/2016  . Cancer of lower lobe of right lung ([place]) 05/06/2016  . Thyroid nodule 12/29/2015  . 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 Cancer of lung ([place]) 10/01/2015  . Thyroid cancer ([place]) 09/11/2015  . Adenopathy   .  RUQ pain 12/29/2014  . Angina pectoris ([place]) 07/02/2014  . Advance care planning 05/14/2014  . Thrombocytopenia, unspecified ([place]) 07/03/2013  . Thrombocytopenia ([place]) 07/03/2013  . Osteopenia 05/05/2013  . Disorder of bone and cartilage 05/05/2013  . Disequilibrium 06/21/2012  . Medicare annual wellness visit, subsequent 04/26/2012  . GERD (gastroesophageal reflux disease) 04/26/2012  . Groin pain 01/12/2012  . Abdominal pain 01/12/2012  . Essential hypertension 11/17/2010  . HLD (hyperlipidemia) 01/31/2008  . 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 ABDOMINAL HYSTERECTOMY  12-30-08  . APPENDECTOMY  11/23/2016   Dr. Adonis Huguenin  . BREAST MASS EXCISION  1977   benign  . CARPAL TUNNEL RELEASE Right 1978  . CHOLECYSTECTOMY  11/23/2016   Procedure: LAPAROSCOPIC CHOLECYSTECTOMY;  Surgeon: Clayburn Pert, MD;  Location: [facility] ORS;  Service: General;;  . COLONOSCOPY  2009  . DILATION AND CURETTAGE OF UTERUS  2001  . ELECTROMAGNETIC NAVIGATION BROCHOSCOPY N/A 08/25/2015   Procedure: ELECTROMAGNETIC NAVIGATION BRONCHOSCOPY;  Surgeon: Flora Lipps, MD;  Location: [facility] ORS;  Service: Cardiopulmonary;  Laterality: N/A;  . ENDOBRONCHIAL ULTRASOUND N/A 08/25/2015   Procedure: ENDOBRONCHIAL ULTRASOUND;  Surgeon: Flora Lipps, MD;  Location: [facility] ORS;  Service: Cardiopulmonary;  Laterality: N/A;  . ESOPHAGOGASTRODUODENOSCOPY  2002, 11/15/07   Normal (Dr. Allyn Kenner)  . FACIAL COSMETIC SURGERY  01/13/10   mini facelift  .  HERNIA REPAIR  1976  . THORACOTOMY/LOBECTOMY Right 10/27/2015   Procedure: THORACOTOMY/LOBECTOMY;  Surgeon: Nestor Lewandowsky, MD;  Location: [facility] ORS;  Service: Thoracic;  Laterality: Right;  . THYROIDECTOMY N/A 12/29/2015   Procedure: THYROIDECTOMY;  Surgeon: Beverly Gust, MD;  Location: [facility] ORS;  Service: ENT;  Laterality: N/A;  . TOTAL VAGINAL HYSTERECTOMY  12/30/08   for fibroid pain (Dr. Gertie Fey)    Prior to  Admission medications   Medication Sig Start Date End Date Taking? Authorizing Provider  Ascorbic Acid (VITAMIN C) 500 MG tablet Take 2,000 mg by mouth daily. Reported on 02/04/2016   Yes [provider]  Biotin (PA BIOTIN) 1000 MCG tablet Take 1,000 mcg by mouth daily. Reported on 02/04/2016   Yes [provider]  Cholecalciferol (VITAMIN D-3) 1000 units CAPS Take 1 capsule by mouth daily with supper.    Yes [provider]  Coenzyme Q10 (COQ10) 200 MG CAPS Take 200 mg by mouth daily with supper.   Yes [provider]  Folic Acid-Vit G8-ZMO Q94 (FA-VITAMIN B-6-VITAMIN B-12 PO) Take 1 tablet by mouth daily after breakfast. Reported on 02/04/2016   Yes [provider]  levothyroxine (SYNTHROID, LEVOTHROID) 100 MCG tablet TAKE 1 TABLET (100 MCG TOTAL) BY MOUTH DAILY BEFORE BREAKFAST. 11/14/16  Yes Cammie Sickle, MD  Lutein 20 MG CAPS Take 20 mg by mouth daily.   Yes [provider]  Omega-3 Fatty Acids (SUPER OMEGA 3 EPA/DHA) 1000 MG CAPS Take 1,000 mg by mouth daily with supper.   Yes [provider]  omeprazole (PRILOSEC) 40 MG capsule Take 1 capsule (40 mg total) by mouth daily. 05/24/16  Yes Tonia Ghent, MD  OVER THE COUNTER MEDICATION Take 1 tablet by mouth daily with supper. calcium 500 mg and K 40 mcg   Yes [provider]  Potassium Gluconate 595 MG CAPS Take 595 mg by mouth daily.    Yes [provider]  pravastatin (PRAVACHOL) 40 MG tablet Take 1 tablet (40 mg total) by mouth daily. Patient taking differently: Take 40 mg by mouth at bedtime.  05/24/16  Yes Tonia Ghent, MD  HYDROcodone-acetaminophen (NORCO) 5-325 MG tablet Take 1 tablet by mouth every 6 (six) hours as needed for moderate pain. 12/01/16   Clayburn Pert, MD    Allergies Atorvastatin; Ciprofloxacin; Epinephrine; Metoprolol succinate; Simvastatin; and Sulfonamide derivatives  Family History  Problem Relation Age of Onset  . Transient  ischemic attack Mother   . Hypertension Mother   . Stroke Mother        mini strokes  . Hypertension Sister   . Cancer Sister        ovarian  . Hypertension Sister   . Hypertension Sister   . Hypertension Sister   . Breast cancer Neg Hx   . Colon cancer Neg Hx     Social History Social History  Substance Use Topics  . Smoking status: Never Smoker  . Smokeless tobacco: Never Used  . Alcohol use No    Review of Systems Constitutional: No fever/chills Eyes: No visual changes. ENT: No sore throat. No stiff neck no neck pain Cardiovascular: + chest pain. Respiratory: Denies shortness of breath. Gastrointestinal:   no vomiting.  No diarrhea.  No constipation. Genitourinary: Negative for dysuria. Musculoskeletal: Negative lower extremity swelling Skin: Negative for rash. Neurological: Negative for severe headaches, focal weakness or numbness. 10-point ROS otherwise negative.  ____________________________________________   PHYSICAL EXAM:  VITAL SIGNS: ED Triage Vitals [03/20/17 1249]  Enc Vitals  Group     BP 139/70     Pulse Rate 98     Resp 16     Temp 97.6 F (36.4 C)     Temp Source Oral     SpO2 98 %     Weight 120 lb (54.4 kg)     Height '5\' 4"'$  (1.626 m)     Head Circumference      Peak Flow      Pain Score      Pain Loc      Pain Edu?      Excl. in [place]?     Constitutional: Alert and oriented. Well appearing and in no acute distress. Eyes: Conjunctivae are normal. PERRL. EOMI. Head: Atraumatic. Nose: No congestion/rhinnorhea. Mouth/Throat: Mucous membranes are moist.  Oropharynx non-erythematous. Neck: No stridor.   Nontender with no meningismus Cardiovascular: Normal rate, regular rhythm. Grossly normal heart sounds.  Good peripheral circulation. Respiratory: Normal respiratory effort.  No retractions. Lungs CTAB. Abdominal: Soft and nontender. No distention. No guarding no rebound Back:  There is no focal tenderness or step off.  there is no midline  tenderness there are no lesions noted. there is no CVA tenderness Musculoskeletal: No lower extremity tenderness, no upper extremity tenderness. No joint effusions, no DVT signs strong distal pulses no edema Neurologic:  Normal speech and language. No gross focal neurologic deficits are appreciated.  Skin:  Skin is warm, dry and intact. No rash noted. Psychiatric: Mood and affect are Somewhat anxious. Speech and behavior are normal.  ____________________________________________   LABS (all labs ordered are listed, but only abnormal results are displayed)  Labs Reviewed  CBC - Abnormal; Notable for the following:       Result Value   RDW 14.6 (*)    Platelets 135 (*)    All other components within normal limits  COMPREHENSIVE METABOLIC PANEL - Abnormal; Notable for the following:    Glucose, Bld 111 (*)    Total Protein 6.4 (*)    All other components within normal limits  TROPONIN I  LIPASE, BLOOD  TSH   ____________________________________________  EKG  I personally interpreted any EKGs ordered by me or triage Sinus tach rate 102, normal axis no acute ST elevation or depression besides slight tachycardia, which is since resolved, patient is essentially unremarkable EKG ____________________________________________  RADIOLOGY  I reviewed any imaging ordered by me or triage that were performed during my shift and, if possible, patient and/or family made aware of any abnormal findings. ____________________________________________   PROCEDURES  Procedure(s) performed: None  Procedures  Critical Care performed: None  ____________________________________________   INITIAL IMPRESSION / ASSESSMENT AND PLAN / ED COURSE  Pertinent labs & imaging results that were available during my care of the patient were reviewed by me and considered in my medical decision making (see chart for details).  Administration here with chest pain on exertion this morning she is 71 years old  she has not had a recent stress test, she states over the last couple weeks she has had some other left-sided jaw pain and left-sided arm pain. Concerning for possible occult angina. She certainly is not having pain right now. We are giving her a dose of aspirin. She has no pleuritic symptoms no dyspnea no lung symptoms of any variety, I do not believe that she likely has a PE. She is quite anxious but I think more relaxed now. I will discuss with the hospitalist service of exertional chest pain and a patient this age  require some further investigation as an inpatient    ____________________________________________   FINAL CLINICAL IMPRESSION(S) / ED DIAGNOSES  Final diagnoses:  None      This chart was dictated using voice recognition software.  Despite best efforts to proofread,  errors can occur which can change meaning.      Schuyler Amor, MD 03/20/17 1620

## 2017-03-20 NOTE — ED Notes (Signed)
Dr. Burlene Arnt in room to discuss plan of care.  Will continue to monitor.

## 2017-03-21 ENCOUNTER — Observation Stay
Admit: 2017-03-21 | Discharge: 2017-03-21 | Disposition: A | Discharge status: Home / Self Care | Payer: PPO | Attending: Internal Medicine | Admitting: Internal Medicine

## 2017-03-21 ENCOUNTER — Ambulatory Visit: Payer: PPO | Admitting: Family Medicine

## 2017-03-21 ENCOUNTER — Observation Stay: Payer: PPO

## 2017-03-21 DIAGNOSIS — I10 Essential (primary) hypertension: Secondary | ICD-10-CM | POA: Diagnosis not present

## 2017-03-21 DIAGNOSIS — E785 Hyperlipidemia, unspecified: Secondary | ICD-10-CM | POA: Diagnosis not present

## 2017-03-21 DIAGNOSIS — E039 Hypothyroidism, unspecified: Secondary | ICD-10-CM | POA: Diagnosis not present

## 2017-03-21 DIAGNOSIS — R079 Chest pain, unspecified: Secondary | ICD-10-CM | POA: Diagnosis not present

## 2017-03-21 LAB — TROPONIN I: Troponin I: <0.03 ng/mL (ref <0.03)

## 2017-03-21 MED ORDER — TECHNETIUM TC 99M TETROFOSMIN IV KIT
13.2100 | PACK | Freq: Once | INTRAVENOUS | Status: AC | PRN
  Administered 2017-03-21: 13.21 via INTRAVENOUS

## 2017-03-21 MED ORDER — TECHNETIUM TC 99M TETROFOSMIN IV KIT
30.0000 | PACK | Freq: Once | INTRAVENOUS | Status: AC | PRN
  Administered 2017-03-21: 29.58 via INTRAVENOUS

## 2017-03-21 NOTE — Progress Notes (Signed)
Patient is discharge home in a stable condition, after stress test negative , summary and f/u care given , verbalized understanding ,

## 2017-03-21 NOTE — Care Management Obs Status (Signed)
[place] NOTIFICATION   Patient Details  Name: [name] MRN: [id number] Date of Birth: 1946-08-22   Medicare Observation Status Notification Given:  No  < 24 hours  Katrina Stack, RN 03/21/2017, 1:19 PM

## 2017-03-21 NOTE — Progress Notes (Signed)
*  PRELIMINARY RESULTS* Echocardiogram 2D Echocardiogram has been performed.  Sherrie Sport 03/21/2017, 12:08 PM

## 2017-03-21 NOTE — Discharge Summary (Signed)
[facility] NAME: [name]    MR#:  [id number]  DATE OF BIRTH:  12-May-1946  DATE OF ADMISSION:  03/20/2017 ADMITTING PHYSICIAN: Nicholes Mango, MD  DATE OF DISCHARGE: 03/21/2017  2:52 PM  PRIMARY CARE PHYSICIAN: Tonia Ghent, MD    ADMISSION DIAGNOSIS:  Chest pain [R07.9] Chest pain, unspecified type [R07.9]  DISCHARGE DIAGNOSIS:  Active Problems:   Chest pain   SECONDARY DIAGNOSIS:   Past Medical History:  Diagnosis Date  . Cancer of lung ([place]) 10/01/2015  . GERD (gastroesophageal reflux disease)   . Groin pain    Along superior/laterl R inguinal canal.  Could be due to hernia or old scar tissue.  prev with CT done w/o alarming findings.  Will treat episodically.  Use tylenol #3 prn.    . Hyperlipidemia   . Hypertension   . Lipoma of thigh    Left  . Normal cardiac stress test 2014  . PONV (postoperative nausea and vomiting)    nausea after hysterectomy, and 2 days after lung surgery 10/2015  . Thyroid nodule     HOSPITAL COURSE:   71 year old female with past medical history of lung cancer, hypertension, hyperlipidemia, hypothyroidism and presented to the hospital with chest pain.  1. chest pain-patient was observed overnight on telemetry, had 3 sets of cardiac markers checked which were negative. -Patient underwent a nuclear medicine stress test which showed no evidence of acute myocardial ischemia. She is currently chest pain-free and hemodynamically stable and therefore being discharged home.  2. History of lung cancer-currently in remission.  3. Hyperlipidemia-Pravachol.  4. Hypothyroidism-she'll resume her Synthroid.  DISCHARGE CONDITIONS:   Stable  CONSULTS OBTAINED:    DRUG ALLERGIES:   Allergies  Allergen Reactions  . Atorvastatin     REACTION: myalgias  . Ciprofloxacin     REACTION: increase reflux  . Epinephrine     Heart racing with dental injection  . Metoprolol Succinate  Swelling    Swelling and stiffness in hands and ankles  . Simvastatin Other (See Comments)    Muscle aches and stiffness  . Sulfonamide Derivatives     REACTION: itching    DISCHARGE MEDICATIONS:   Allergies as of 03/21/2017      Reactions   Atorvastatin    REACTION: myalgias   Ciprofloxacin    REACTION: increase reflux   Epinephrine    Heart racing with dental injection   Metoprolol Succinate Swelling   Swelling and stiffness in hands and ankles   Simvastatin Other (See Comments)   Muscle aches and stiffness   Sulfonamide Derivatives    REACTION: itching      Medication List    TAKE these medications   CoQ10 200 MG Caps Take 200 mg by mouth daily with supper.   FA-VITAMIN B-6-VITAMIN B-12 PO Take 1 tablet by mouth daily after breakfast. Reported on 02/04/2016   HYDROcodone-acetaminophen 5-325 MG tablet Commonly known as:  NORCO Take 1 tablet by mouth every 6 (six) hours as needed for moderate pain.   levothyroxine 100 MCG tablet Commonly known as:  SYNTHROID, LEVOTHROID TAKE 1 TABLET (100 MCG TOTAL) BY MOUTH DAILY BEFORE BREAKFAST.   Lutein 20 MG Caps Take 20 mg by mouth daily.   omeprazole 40 MG capsule Commonly known as:  PRILOSEC Take 1 capsule (40 mg total) by mouth daily.   OVER THE COUNTER MEDICATION Take 1 tablet by mouth daily with supper. calcium 500 mg and K 40 mcg  PA BIOTIN 1000 MCG tablet Generic drug:  Biotin Take 1,000 mcg by mouth daily. Reported on 02/04/2016   Potassium Gluconate 595 MG Caps Take 595 mg by mouth daily.   pravastatin 40 MG tablet Commonly known as:  PRAVACHOL Take 1 tablet (40 mg total) by mouth daily. What changed:  when to take this   SUPER OMEGA 3 EPA/DHA 1000 MG Caps Take 1,000 mg by mouth daily with supper.   vitamin C 500 MG tablet Commonly known as:  ASCORBIC ACID Take 2,000 mg by mouth daily. Reported on 02/04/2016   Vitamin D-3 1000 units Caps Take 1 capsule by mouth daily with supper.          DISCHARGE INSTRUCTIONS:   DIET:  Regular diet  DISCHARGE CONDITION:  Stable  ACTIVITY:  Activity as tolerated  OXYGEN:  Home Oxygen: No.   Oxygen Delivery: room air  DISCHARGE LOCATION:  home   If you experience worsening of your admission symptoms, develop shortness of breath, life threatening emergency, suicidal or homicidal thoughts you must seek medical attention immediately by calling 911 or calling your MD immediately  if symptoms less severe.  You Must read complete instructions/literature along with all the possible adverse reactions/side effects for all the Medicines you take and that have been prescribed to you. Take any new Medicines after you have completely understood and accpet all the possible adverse reactions/side effects.   Please note  You were cared for by a hospitalist during your hospital stay. If you have any questions about your discharge medications or the care you received while you were in the hospital after you are discharged, you can call the unit and asked to speak with the hospitalist on call if the hospitalist that took care of you is not available. Once you are discharged, your primary care physician will handle any further medical issues. Please note that NO REFILLS for any discharge medications will be authorized once you are discharged, as it is imperative that you return to your primary care physician (or establish a relationship with a primary care physician if you do not have one) for your aftercare needs so that they can reassess your need for medications and monitor your lab values.     Today   Chest pain now resolved.  No acute complaints presently. Family at bedside.   VITAL SIGNS:  Blood pressure 127/62, pulse 86, temperature 98.2 F (36.8 C), temperature source Oral, resp. rate 12, height '5\' 4"'$  (1.626 m), weight 54.4 kg (120 lb), SpO2 93 %.  I/O:   Intake/Output Summary (Last 24 hours) at 03/21/17 1605 Last data filed at  03/21/17 1405  Gross per 24 hour  Intake              240 ml  Output             2000 ml  Net            -1760 ml    PHYSICAL EXAMINATION:  GENERAL:  71 y.o.-year-old patient lying in the bed with no acute distress.  EYES: Pupils equal, round, reactive to light and accommodation. No scleral icterus. Extraocular muscles intact.  HEENT: Head atraumatic, normocephalic. Oropharynx and nasopharynx clear.  NECK:  Supple, no jugular venous distention. No thyroid enlargement, no tenderness.  LUNGS: Normal breath sounds bilaterally, no wheezing, rales,rhonchi. No use of accessory muscles of respiration.  CARDIOVASCULAR: S1, S2 normal. No murmurs, rubs, or gallops.  ABDOMEN: Soft, non-tender, non-distended. Bowel sounds present. No organomegaly or  mass.  EXTREMITIES: No pedal edema, cyanosis, or clubbing.  NEUROLOGIC: Cranial nerves II through XII are intact. No focal motor or sensory defecits b/l.  PSYCHIATRIC: The patient is alert and oriented x 3. Good affect.  SKIN: No obvious rash, lesion, or ulcer.   DATA REVIEW:   CBC  Recent Labs Lab 03/20/17 1251  WBC 6.6  HGB 13.8  HCT 40.7  PLT 135*    Chemistries   Recent Labs Lab 03/20/17 1251  NA 140  K 3.9  CL 106  CO2 29  GLUCOSE 111*  BUN 18  CREATININE 0.66  CALCIUM 9.0  AST 28  ALT 34  ALKPHOS 43  BILITOT 0.5    Cardiac Enzymes  Recent Labs Lab 03/21/17 0215  TROPONINI <0.03    Microbiology Results  Results for orders placed or performed during the hospital encounter of 10/27/15  Urine culture     Status: None   Collection Time: 10/29/15  8:35 PM  Result Value Ref Range Status   Specimen Description URINE, RANDOM  Final   Special Requests NONE  Final   Culture NO GROWTH 2 DAYS  Final   Report Status 10/31/2015 FINAL  Final    RADIOLOGY:  Dg Chest 2 View  Result Date: 03/20/2017 CLINICAL DATA:  Chest pain history of lung cancer EXAM: CHEST  2 VIEW COMPARISON:  CT 11/15/2016, radiograph 08/12/2016  FINDINGS: Stable lobectomy changes and scarring at the right base. Left lung is clear. No focal consolidation or pleural effusion. Stable cardiomediastinal silhouette with mild atherosclerosis. No pneumothorax. Surgical clips in the right upper quadrant. IMPRESSION: Stable postsurgical changes and scarring in the right lung base. No radiographic evidence for acute cardiopulmonary abnormality. Electronically Signed   By: Donavan Foil M.D.   On: 03/20/2017 16:04      Management plans discussed with the patient, family and they are in agreement.  CODE STATUS:     Code Status Orders        Start     Ordered   03/20/17 2020  Full code  Continuous     03/20/17 2020        TOTAL TIME TAKING CARE OF THIS PATIENT: 40 minutes.    Henreitta Leber M.D on 03/21/2017 at 4:05 PM  Between 7am to 6pm - Pager - [phone]  After 6pm go to [email or web address] - Proofreader  [facility]  Office  [phone]  CC: Primary care physician; Tonia Ghent, MD

## 2017-03-22 ENCOUNTER — Telehealth: Payer: Self-pay | Admitting: *Deleted

## 2017-03-22 LAB — ECHOCARDIOGRAM COMPLETE
Height: 64 in
Weight: 1920 oz

## 2017-03-22 NOTE — Telephone Encounter (Signed)
Lm requesting return call to complete TCM and confirm hosp f/u appt  

## 2017-03-22 NOTE — Telephone Encounter (Signed)
Transition Care Management Follow-up Telephone Call   Date discharged? 03/21/2017    How have you been since you were released from the hospital? "ok"   Do you understand why you were in the hospital? yes   Do you understand the discharge instructions? yes   Where were you discharged to? home   Items Reviewed:  Medications reviewed: yes  Allergies reviewed: yes  Dietary changes reviewed: yes  Referrals reviewed: yes   Functional Questionnaire:   Activities of Daily Living (ADLs):   She states they are independent in the following: pt is independent in all areas     Any transportation issues/concerns?: no   Any patient concerns? no   Confirmed importance and date/time of follow-up visits scheduled yes Provider Appointment booked with Dr Damita Dunnings 5/25 @ 1415 Confirmed with patient if condition begins to worsen call PCP or go to the ER.  Patient was given the office number and encouraged to call back with question or concerns.  : yes

## 2017-03-23 ENCOUNTER — Other Ambulatory Visit: Payer: Self-pay | Admitting: Internal Medicine

## 2017-03-23 DIAGNOSIS — C73 Malignant neoplasm of thyroid gland: Secondary | ICD-10-CM

## 2017-03-23 NOTE — Telephone Encounter (Addendum)
Next appt:  03/24/2017 at 02:15 PM in Family Medicine Elsie Stain, MD)   Ref Range & Units 3d ago  TSH 0.350 - 4.500 uIU/mL 0.266         Last office note states you were changing dose to 75 mcg  # Thyroid Ca s/p thyroidectomy- STAGE I /LOW risk - on Synthroid 100 mcg /day; currently on 0.092. Change the dose of synthroid to 8mcg/day [goal 0.1 to 0.5 ].   Per patient, she never reduced dose, it was not ordered in her chart either.  Please advise

## 2017-03-23 NOTE — Telephone Encounter (Signed)
Per Dr Rogue Bussing, recheck Thyroid Panel will refill based off results. Patient requests to get lab drawn at [facility] due to $100 facility charge here. [facility] order form filled out for her

## 2017-03-24 ENCOUNTER — Ambulatory Visit (INDEPENDENT_AMBULATORY_CARE_PROVIDER_SITE_OTHER): Payer: PPO | Admitting: Family Medicine

## 2017-03-24 ENCOUNTER — Encounter: Payer: Self-pay | Admitting: Family Medicine

## 2017-03-24 VITALS — BP 122/78 | HR 84 | Temp 98.4°F | Wt 120.2 lb

## 2017-03-24 DIAGNOSIS — N644 Mastodynia: Secondary | ICD-10-CM | POA: Diagnosis not present

## 2017-03-24 DIAGNOSIS — R079 Chest pain, unspecified: Secondary | ICD-10-CM | POA: Diagnosis not present

## 2017-03-24 DIAGNOSIS — E785 Hyperlipidemia, unspecified: Secondary | ICD-10-CM

## 2017-03-24 DIAGNOSIS — C73 Malignant neoplasm of thyroid gland: Secondary | ICD-10-CM | POA: Diagnosis not present

## 2017-03-24 NOTE — Patient Instructions (Signed)
I'll check with Dr. Dub Amis will call about your referral. See her on the way out.  Take care.  Glad to see you.  Don't your meds for now.

## 2017-03-24 NOTE — Progress Notes (Signed)
PATIENT NAME: [name]    MR#:  [id number]  DATE OF BIRTH:  06/12/1946  DATE OF ADMISSION:  03/20/2017    ADMITTING PHYSICIAN: Nicholes Mango, MD  DATE OF DISCHARGE: 03/21/2017  2:52 PM  PRIMARY CARE PHYSICIAN: Tonia Ghent, MD    ADMISSION DIAGNOSIS:  Chest pain [R07.9] Chest pain, unspecified type [R07.9]  DISCHARGE DIAGNOSIS:  Active Problems:   Chest pain   SECONDARY DIAGNOSIS:       Past Medical History:  Diagnosis Date  . Cancer of lung ([place]) 10/01/2015  . GERD (gastroesophageal reflux disease)   . Groin pain    Along superior/laterl R inguinal canal.  Could be due to hernia or old scar tissue.  prev with CT done w/o alarming findings.  Will treat episodically.  Use tylenol #3 prn.    . Hyperlipidemia   . Hypertension   . Lipoma of thigh    Left  . Normal cardiac stress test 2014  . PONV (postoperative nausea and vomiting)    nausea after hysterectomy, and 2 days after lung surgery 10/2015  . Thyroid nodule     HOSPITAL COURSE:   71 year old female with past medical history of lung cancer, hypertension, hyperlipidemia, hypothyroidism and presented to the hospital with chest pain.  1. chest pain-patient was observed overnight on telemetry, had 3 sets of cardiac markers checked which were negative. -Patient underwent a nuclear medicine stress test which showed no evidence of acute myocardial ischemia. She is currently chest pain-free and hemodynamically stable and therefore being discharged home.  2. History of lung cancer-currently in remission.  3. Hyperlipidemia-Pravachol.  4. Hypothyroidism-she'll resume her Synthroid.  DISCHARGE CONDITIONS:   Stable ================================================= She had gone to an exercise class and was having L sided chest pain.  She didn't think she could exercise.  She had some episodic jaw and arm symptoms.  She decided to go home and then went to ER.  Admitted for observation,  neg CEs and stress testing.  D/w pt.    She is on thyroid replacement.  She has noted occ heart racing about 2 hours after taking her thyroid medicine. She had repeat thyroid labs done today, pending.   ( per old onc notes: Thyroid Ca s/p thyroidectomy- STAGE I /LOW risk - on Synthroid 100 mcg /day; currently on 0.092. Change the dose of synthroid to 42mcg/day [goal 0.1 to 0.5 ]. )  Her husband had a MI earlier this year with 2 stents placed.   She has had a separate issue- some occ L breast pain.  No lumps or masses.  No discharge.  Mammogram 05/2016: No mammographic evidence of malignancy.   Meds, vitals, and allergies reviewed.   ROS: Per HPI unless specifically indicated in ROS section   GEN: nad, alert and oriented HEENT: mucous membranes moist NECK: supple w/o LA CV: rrr. PULM: ctab, no inc wob ABD: soft, +bs EXT: no edema SKIN: no acute rash Chaperoned exam with B breast exam w/o lump/mass/LA/lesion noted.  No discharge.

## 2017-03-25 DIAGNOSIS — N644 Mastodynia: Secondary | ICD-10-CM | POA: Insufficient documentation

## 2017-03-25 NOTE — Assessment & Plan Note (Signed)
Unclear to me if this is related to relative over-replacment of thyroid due to h/o thyroid CA.  Will ask for onc input on target TSH in her case.  At this point, no sign of cardiac disease and still okay for outpatient f/u.  She agrees.

## 2017-03-25 NOTE — Assessment & Plan Note (Signed)
See above

## 2017-03-25 NOTE — Assessment & Plan Note (Signed)
Continue baseline med.

## 2017-03-25 NOTE — Assessment & Plan Note (Signed)
Unremarkable exam but refer for imaging given the prev diffuse L breast pain and her hx.  D/w pt.  She agrees.

## 2017-03-29 ENCOUNTER — Ambulatory Visit
Admission: RE | Admit: 2017-03-29 | Discharge: 2017-03-29 | Disposition: A | Discharge status: Home / Self Care | Payer: PPO | Source: Ambulatory Visit | Attending: Family Medicine | Admitting: Family Medicine

## 2017-03-29 ENCOUNTER — Telehealth: Payer: Self-pay | Admitting: *Deleted

## 2017-03-29 ENCOUNTER — Other Ambulatory Visit: Payer: Self-pay | Admitting: *Deleted

## 2017-03-29 ENCOUNTER — Telehealth: Payer: Self-pay | Admitting: Internal Medicine

## 2017-03-29 DIAGNOSIS — N644 Mastodynia: Secondary | ICD-10-CM

## 2017-03-29 DIAGNOSIS — R928 Other abnormal and inconclusive findings on diagnostic imaging of breast: Secondary | ICD-10-CM | POA: Diagnosis not present

## 2017-03-29 DIAGNOSIS — C73 Malignant neoplasm of thyroid gland: Secondary | ICD-10-CM

## 2017-03-29 HISTORY — DX: Malignant neoplasm of thyroid gland: C73

## 2017-03-29 MED ORDER — LEVOTHYROXINE SODIUM 100 MCG PO TABS: 100.0000 ug | ORAL_TABLET | Freq: Every day | ORAL | 3 refills | Status: DC

## 2017-03-29 NOTE — Telephone Encounter (Signed)
Asking if she still need to have her labs checked in July since she just had them drawn. Please advise

## 2017-03-29 NOTE — Telephone Encounter (Signed)
TSH- 0.383; N-T4; continue current dose of sythroid 154mcg. Spoke to [place].  Dr.B

## 2017-03-29 NOTE — Telephone Encounter (Signed)
[name]- inform pt that she Can cancel the blood work for July; but keep up the appt.

## 2017-03-30 NOTE — Telephone Encounter (Signed)
Patietn informed of lab cancellation . She stated she would have her CT and then see him  In July. I confirmed this.

## 2017-04-05 LAB — NM MYOCAR MULTI W/SPECT W/WALL MOTION / EF
Estimated workload: 6.4 METS
Exercise duration (min): 4 min
Exercise duration (sec): 30 s
LV dias vol: 34 mL (ref 46–106)
LV sys vol: 10 mL
MPHR: 148 {beats}/min
Peak HR: 123 {beats}/min
Percent HR: 84 %
Percent of predicted max HR: 83 %
Rest HR: 96 {beats}/min
SDS: 0
SRS: 12
SSS: 6
Stage 1 Grade: 0 %
Stage 1 HR: 109 {beats}/min
Stage 1 Speed: 0 mph
Stage 2 Grade: 0 %
Stage 2 HR: 109 {beats}/min
Stage 2 Speed: 0 mph
Stage 3 DBP: 67 mmHg
Stage 3 Grade: 10 %
Stage 3 HR: 120 {beats}/min
Stage 3 SBP: 146 mmHg
Stage 3 Speed: 1.7 mph
Stage 4 Grade: 12 %
Stage 4 HR: 123 {beats}/min
Stage 4 Speed: 2.5 mph
Stage 5 Grade: 0 %
Stage 5 HR: 109 {beats}/min
Stage 5 Speed: 0 mph
Stage 6 DBP: 74 mmHg
Stage 6 Grade: 0 %
Stage 6 HR: 98 {beats}/min
Stage 6 SBP: 127 mmHg
Stage 6 Speed: 0 mph
TID: 0.85

## 2017-04-27 ENCOUNTER — Encounter: Payer: Self-pay | Admitting: Internal Medicine

## 2017-05-04 IMAGING — US US THYROID BIOPSY
1 series · 13 of 25 positions shown · non-contrast
Comparison: None.

CLINICAL DATA: Abnormal PET-CT with hypermetabolic right thyroid
nodule

EXAM:
ULTRASOUND GUIDED NEEDLE ASPIRATE BIOPSY OF THE THYROID GLAND

[Series 1: us thyroid biopsy · 0.07mm/px · 13 of 31 slices shown]
[im 1/31]
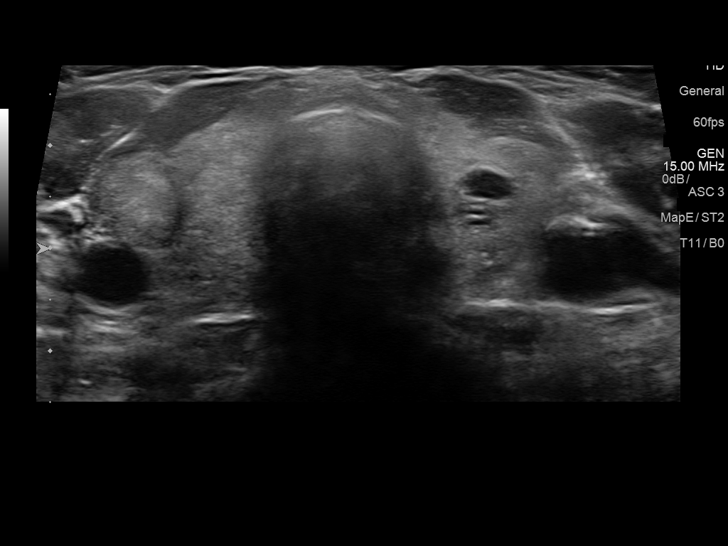
[im 3/31]
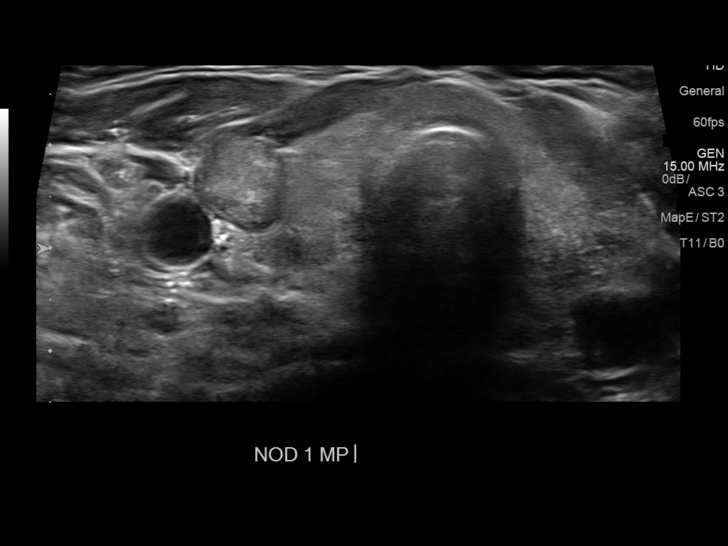
[im 6/31]
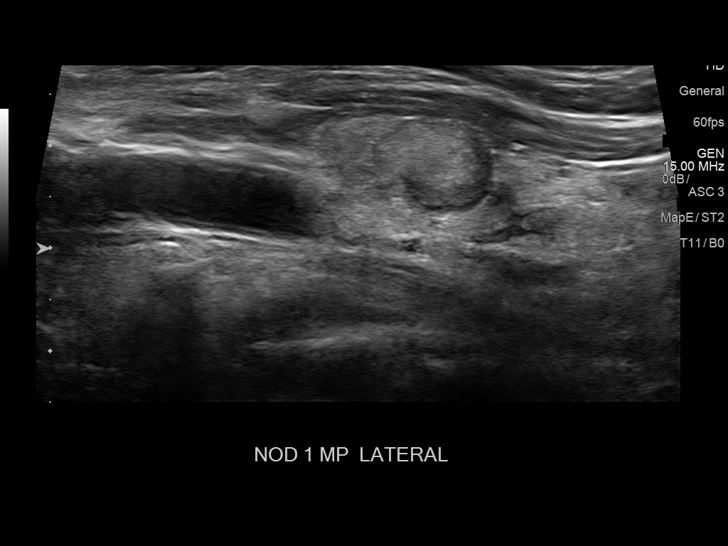
[im 8/31]
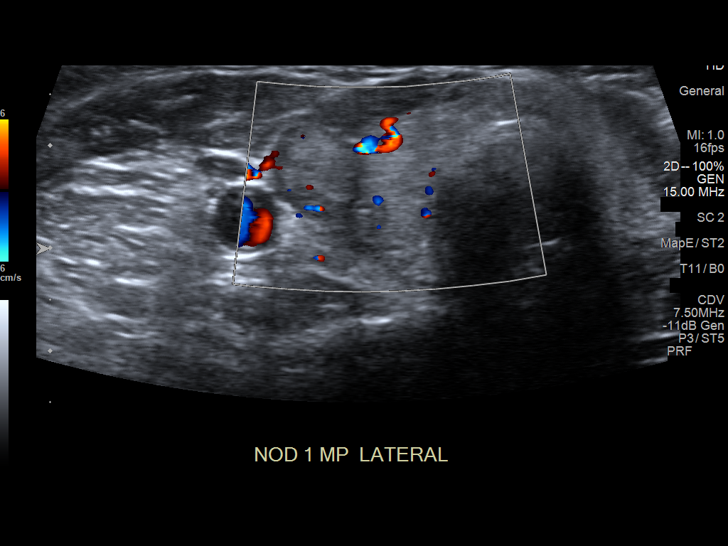
[im 11/31]
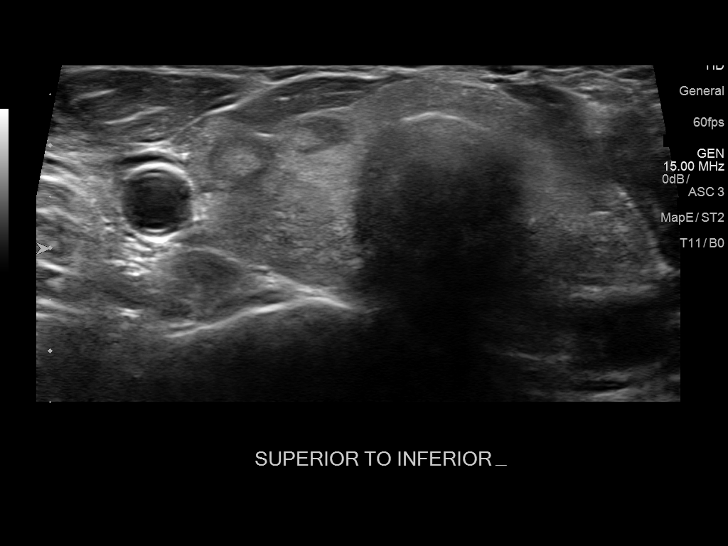
[im 13/31]
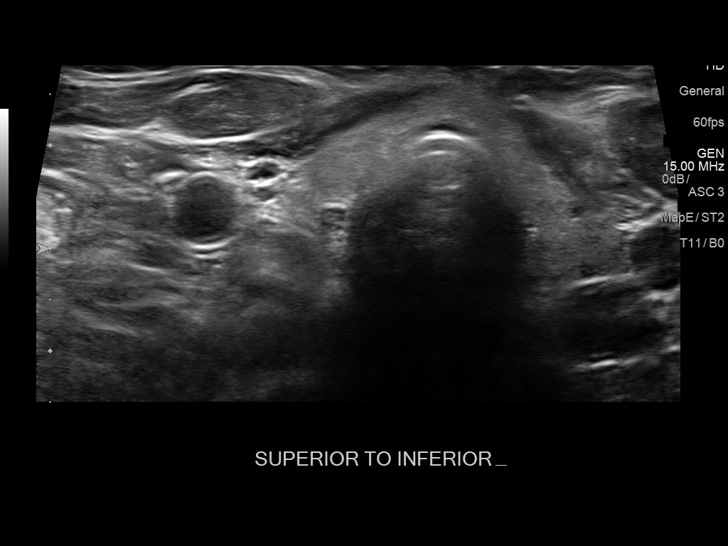
[im 16/31]
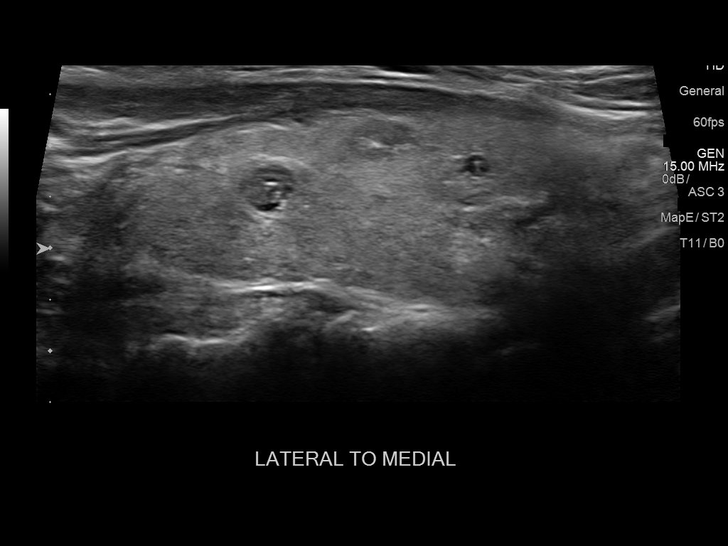
[im 18/31]
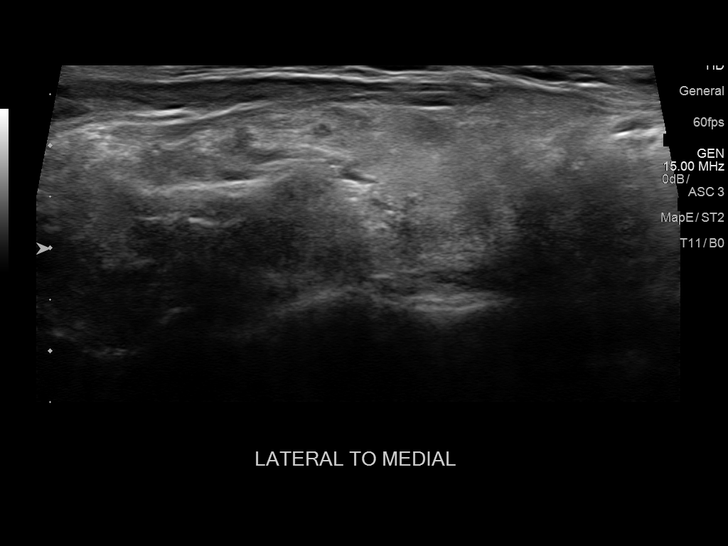
[im 21/31]
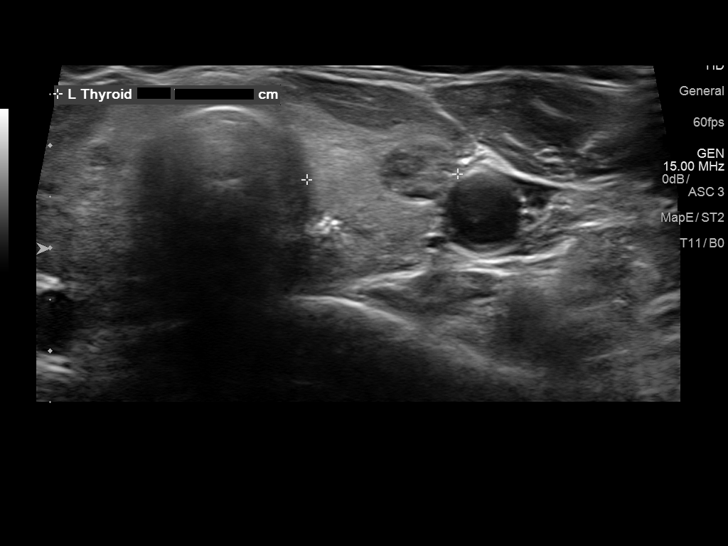
[im 23/31]
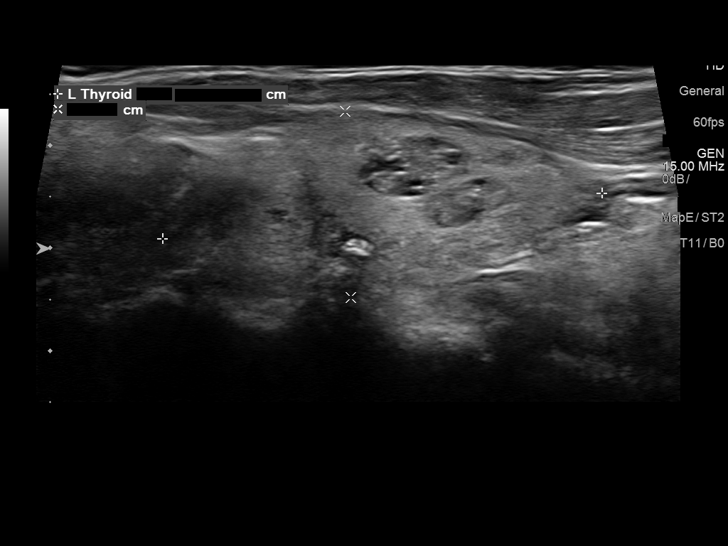
[im 26/31]
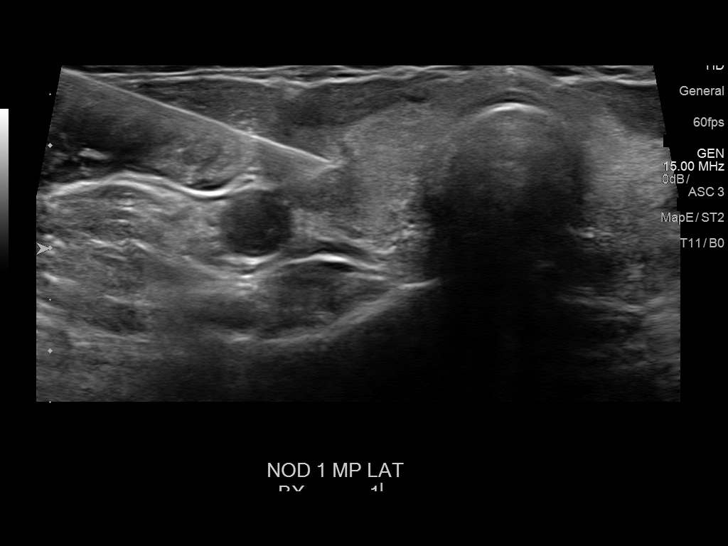
[im 28/31]
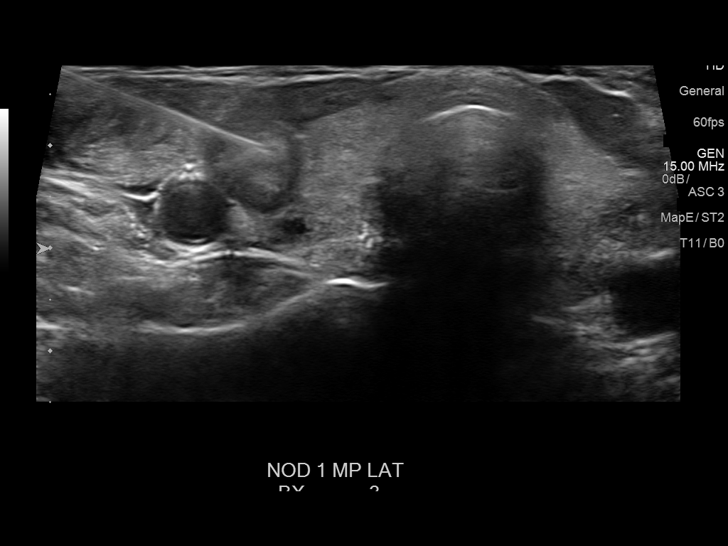
[im 31/31]
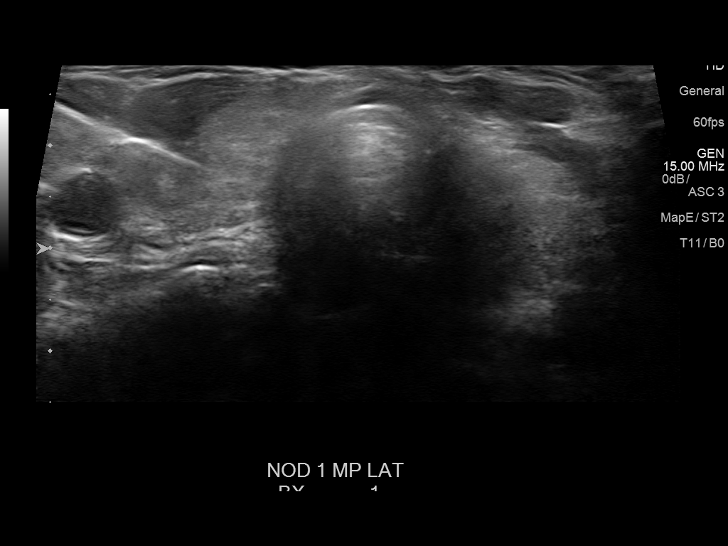

[13 of 25 positions shown; findings below may reference images not displayed]

PROCEDURE:
Thyroid biopsy was thoroughly discussed with the patient and
questions were answered. The benefits, risks, alternatives, and
complications were also discussed. The patient understands and
wishes to proceed with the procedure. Written consent was obtained.

Ultrasound was performed to localize and mark an adequate site for
the biopsy. The patient was then prepped and draped in a normal
sterile fashion. Local anesthesia was provided with 1% lidocaine.
Using direct ultrasound guidance, 4 passes were made using needles
into the nodule within the right lobe of the thyroid. Ultrasound was
used to confirm needle placements on all occasions. Specimens were
confirmed to be adequate by pathology. Additionally due to the
somewhat extrinsic appearance of the lesion a single core biopsy
pass was performed and sent in formalin. The patient tolerated the
procedure well and was returned to her room in satisfactory
condition.

COMPLICATIONS:
None.
FINDINGS: Initial survey of the thyroid was performed and reveals a 1.1 cm
well-circumscribed nodule arising along the lateral aspect of the
right lobe of the thyroid. On some of the transverse imaging, it
appears as though it may be extrinsic to the thyroid impinging into
the gland. For this reason, the core biopsy was obtained in addition
to the fine-needle aspirate. A few scattered smaller nodules are
noted within the right lobe of the thyroid which were also not
metabolically active. Scattered nodules are noted throughout the
left lobe of the thyroid none of which appeared hypermetabolic on
the prior exam. The largest of these measures 11 mm in greatest
dimension.
IMPRESSION: Ultrasound guided needle aspirate and core biopsy performed of the
right thyroid nodule.

Bilateral thyroid nodules which were not hypermetabolic on recent
PET-CT and do not meet biopsy criteria.

## 2017-05-05 DIAGNOSIS — M2011 Hallux valgus (acquired), right foot: Secondary | ICD-10-CM | POA: Diagnosis not present

## 2017-05-05 DIAGNOSIS — M2042 Other hammer toe(s) (acquired), left foot: Secondary | ICD-10-CM | POA: Diagnosis not present

## 2017-05-05 DIAGNOSIS — M2041 Other hammer toe(s) (acquired), right foot: Secondary | ICD-10-CM | POA: Diagnosis not present

## 2017-05-15 ENCOUNTER — Other Ambulatory Visit: Payer: Self-pay | Admitting: Family Medicine

## 2017-05-15 DIAGNOSIS — Z1231 Encounter for screening mammogram for malignant neoplasm of breast: Secondary | ICD-10-CM

## 2017-05-15 NOTE — Progress Notes (Signed)
PCP notes:   Health maintenance: No gaps identified.   Abnormal screenings: None   Patient concerns:   1) She would like to discuss CT results from 05/16/2017, specifically dx of aortic atherosclerosis and if there are any preventive measure she should take regarding it.  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 She has not started rx yet, but will consider it and wants PCP to be aware.   Nurse concerns: BP is elevated today, which she states is common when she is nervous. Readings at appt yesterday were WNL and she brings home readings with systolics [id number].   Next PCP appt: 05/30/2017 @ 9:45am  I reviewed health advisor's note, was available for consultation on the day of service listed in this note, and agree with documentation and plan. Elsie Stain, MD.

## 2017-05-15 NOTE — Progress Notes (Signed)
Subjective:   [name] is a 71 y.o. female who presents for Medicare Annual (Subsequent) preventive examination.  Review of Systems:  No ROS.  Medicare Wellness Visit. Additional risk factors are reflected in the social history.  Cardiac Risk Factors include: advanced age (>12men, >94 women);dyslipidemia;hypertension     Objective:     Vitals: BP (!) 150/76   Pulse 83   Ht 5\' 4"  (1.626 m)   Wt 118 lb 12 oz (53.9 kg)   SpO2 98%   BMI 20.38 kg/m   Body mass index is 20.38 kg/m.  BP Readings from Last 3 Encounters:  05/23/17 (!) 150/76  05/22/17 126/79  03/24/17 122/78    Tobacco History  Smoking Status  . Never Smoker  Smokeless Tobacco  . Never Used     Counseling given: Not Answered   Past Medical History:  Diagnosis Date  . Cancer of lung ([place]) 10/01/2015  . GERD (gastroesophageal reflux disease)   . Groin pain    Along superior/laterl R inguinal canal.  Could be due to hernia or old scar tissue.  prev with CT done w/o alarming findings.  Will treat episodically.  Use tylenol #3 prn.    . Hyperlipidemia   . Hypertension   . Lipoma of thigh    Left  . Normal cardiac stress test 2014  . PONV (postoperative nausea and vomiting)    nausea after hysterectomy, and 2 days after lung surgery 10/2015  . Thyroid cancer ([place])   . Thyroid nodule    Past Surgical History:  Procedure Laterality Date  . ABDOMINAL HYSTERECTOMY  12-30-08  . APPENDECTOMY  11/23/2016   Dr. Adonis Huguenin  . BREAST EXCISIONAL BIOPSY Right 1977  . BREAST MASS EXCISION  1977   benign  . CARPAL TUNNEL RELEASE Right 1978  . CHOLECYSTECTOMY  11/23/2016   Procedure: LAPAROSCOPIC CHOLECYSTECTOMY;  Surgeon: Clayburn Pert, MD;  Location: [facility] ORS;  Service: General;;  . COLONOSCOPY  2009  . DILATION AND CURETTAGE OF UTERUS  2001  . ELECTROMAGNETIC NAVIGATION BROCHOSCOPY N/A 08/25/2015   Procedure: ELECTROMAGNETIC NAVIGATION BRONCHOSCOPY;  Surgeon: Flora Lipps, MD;  Location: [facility] ORS;   Service: Cardiopulmonary;  Laterality: N/A;  . ENDOBRONCHIAL ULTRASOUND N/A 08/25/2015   Procedure: ENDOBRONCHIAL ULTRASOUND;  Surgeon: Flora Lipps, MD;  Location: [facility] ORS;  Service: Cardiopulmonary;  Laterality: N/A;  . ESOPHAGOGASTRODUODENOSCOPY  2002, 11/15/07   Normal (Dr. Allyn Kenner)  . FACIAL COSMETIC SURGERY  01/13/10   mini facelift  . HERNIA REPAIR  1976  . THORACOTOMY/LOBECTOMY Right 10/27/2015   Procedure: THORACOTOMY/LOBECTOMY;  Surgeon: Nestor Lewandowsky, MD;  Location: [facility] OR[facility];  Service: Thoracic;  Laterality: Right;  . THYROIDECTOMY N/A 12/29/2015   Procedure: THYROIDECTOMY;  Surgeon: Beverly Gust, MD;  Location: [facility] ORS;  Service: ENT;  Laterality: N/A;  . TOTAL VAGINAL HYSTERECTOMY  12/30/08   for fibroid pain (Dr. Gertie Fey)   Family History  Problem Relation Age of Onset  . Transient ischemic attack Mother   . Hypertension Mother   . Stroke Mother        mini strokes  . Hypertension Sister   . Cancer Sister        ovarian  . Hypertension Sister   . Hypertension Sister   . Hypertension Sister   . Breast cancer Neg Hx   . Colon cancer Neg Hx    History  Sexual Activity  . Sexual activity: No    Outpatient Encounter Prescriptions as of 05/23/2017  Medication Sig  . Ascorbic Acid (VITAMIN C)  500 MG tablet Take 2,000 mg by mouth daily. Reported on 02/04/2016  . Biotin (PA BIOTIN) 1000 MCG tablet Take 1,000 mcg by mouth daily. Reported on 02/04/2016  . Cholecalciferol (VITAMIN D-3) 1000 units CAPS Take 1 capsule by mouth daily with supper.   . Coenzyme Q10 (COQ10) 200 MG CAPS Take 200 mg by mouth daily with supper.  . Folic Acid-Vit K2-IOX B35 (FA-VITAMIN B-6-VITAMIN B-12 PO) Take 1 tablet by mouth daily after breakfast. Reported on 02/04/2016  . gabapentin (NEURONTIN) 100 MG capsule Take one pill in am; and in 1 pill pm; if not improved then take 2 in pm  . levothyroxine (SYNTHROID, LEVOTHROID) 75 MCG tablet Take 1 tablet (75 mcg total) by mouth daily before breakfast.  .  Lutein 20 MG CAPS Take 20 mg by mouth daily.  . Omega-3 Fatty Acids (SUPER OMEGA 3 EPA/DHA) 1000 MG CAPS Take 1,000 mg by mouth daily with supper.  Marland Kitchen omeprazole (PRILOSEC) 40 MG capsule Take 1 capsule (40 mg total) by mouth daily.  Marland Kitchen OVER THE COUNTER MEDICATION Take 1 tablet by mouth daily with supper. calcium 500 mg and K 40 mcg  . Potassium Gluconate 595 MG CAPS Take 595 mg by mouth daily.   . pravastatin (PRAVACHOL) 40 MG tablet Take 1 tablet (40 mg total) by mouth daily. (Patient taking differently: Take 40 mg by mouth at bedtime. )  . [DISCONTINUED] levothyroxine (SYNTHROID, LEVOTHROID) 100 MCG tablet Take 1 tablet (100 mcg total) by mouth daily before breakfast.   No facility-administered encounter medications on file as of 05/23/2017.     Activities of Daily Living In your present state of health, do you have any difficulty performing the following activities: 05/23/2017 03/20/2017  Hearing? N N  Vision? N N  Difficulty concentrating or making decisions? N N  Walking or climbing stairs? N N  Dressing or bathing? N N  Doing errands, shopping? N N  Preparing Food and eating ? N -  Using the Toilet? N -  In the past six months, have you accidently leaked urine? N -  Do you have problems with loss of bowel control? N -  Managing your Medications? N -  Managing your Finances? N -  Housekeeping or managing your Housekeeping? N -  Some recent data might be hidden    Patient Care Team: Tonia Ghent, MD as PCP - General (Family Medicine) Beverly Gust, MD as Consulting Physician (Otolaryngology) Nestor Lewandowsky, MD as Consulting Physician (Cardiothoracic Surgery) Cammie Sickle, MD as Consulting Physician (Internal Medicine) Birder Robson, MD as Consulting Physician (Ophthalmology) Clayburn Pert, MD as Consulting Physician (General Surgery)    Assessment:    Physical assessment deferred to PCP.  Exercise Activities and Dietary recommendations Current Exercise  Habits: Home exercise routine, Type of exercise: Other - see comments;[facility]ing [facility] Time (Minutes): 30, Frequency (Times/Week): 6, Weekly Exercise (Minutes/Week): 180  Goals    . Increase physical activity (pt-stated)          Starting 05/23/2017, I will continue to exercise for at least 30 min daily.       Fall Risk Fall Risk  05/23/2017 05/20/2016 05/21/2015 05/13/2014 05/07/2013  Falls in the past year? No No No No No   Depression Screen PHQ 2/9 Scores 05/23/2017 05/20/2016 05/21/2015 05/13/2014  PHQ - 2 Score 1 0 0 0     Cognitive Function PLEASE NOTE: A Mini-Cog screen was completed. Maximum score is 20. A value of 0 denotes this  part of [place] MMSE was not completed or the patient failed this part of the Mini-Cog screening.   Mini-Cog Screening Orientation to Time - Max 5 pts Orientation to Place - Max 5 pts Registration - Max 3 pts Recall - Max 3 pts Language Repeat - Max 1 pts Language Follow 3 Step Command - Max 3 pts      Mini-Cog - 05/23/17 1042    Normal clock drawing test? yes   How many words correct? 3      MMSE - Mini Mental State Exam 05/23/2017 05/20/2016  Orientation to time 5 5  Orientation to Place 5 5  Registration 3 3  Attention/ Calculation 0 0  Recall 3 3  Language- name 2 objects 0 0  Language- repeat 1 1  Language- follow 3 step command 3 3  Language- read & follow direction 0 0  Write a sentence 0 0  Copy design 0 0  Total score 20 20        Immunization History  Administered Date(s) Administered  . Influenza Split 08/10/2012  . Influenza, High Dose Seasonal PF 08/10/2016  . Influenza,inj,Quad PF,36+ Mos 08/13/2015  . Influenza-Unspecified 07/01/2013, 07/01/2014  . Pneumococcal Conjugate-13 05/13/2014  . Pneumococcal Polysaccharide-23 02/17/2010  . Td 02/11/2009  . Zoster 05/07/2010   Screening Tests Health Maintenance  Topic Date Due  . INFLUENZA VACCINE  05/31/2017  . MAMMOGRAM  06/07/2018  . COLONOSCOPY   09/15/2018  . TETANUS/TDAP  02/12/2019  . DEXA SCAN  Completed  . Hepatitis C Screening  Completed  . PNA vac Low Risk Adult  Completed      Plan:    Follow-up w/ PCP as scheduled.   I have personally reviewed and noted the following in the patient's chart:   . Medical and social history . Use of alcohol, tobacco or illicit drugs  . Current medications and supplements . Functional ability and status . Nutritional status . Physical activity . Advanced directives . List of other physicians . Vitals . Screenings to include cognitive, depression, and falls . Referrals and appointments  In addition, I have reviewed and discussed with patient certain preventive protocols, quality metrics, and best practice recommendations. A written personalized care plan for preventive services as well as general preventive health recommendations were provided to patient.     Dorrene German, RN  05/23/2017

## 2017-05-16 ENCOUNTER — Ambulatory Visit
Admission: RE | Admit: 2017-05-16 | Discharge: 2017-05-16 | Disposition: A | Discharge status: Home / Self Care | Payer: PPO | Source: Ambulatory Visit | Attending: Internal Medicine | Admitting: Internal Medicine

## 2017-05-16 ENCOUNTER — Other Ambulatory Visit: Payer: PPO

## 2017-05-16 DIAGNOSIS — R918 Other nonspecific abnormal finding of lung field: Secondary | ICD-10-CM | POA: Diagnosis not present

## 2017-05-16 DIAGNOSIS — C3431 Malignant neoplasm of lower lobe, right bronchus or lung: Secondary | ICD-10-CM | POA: Diagnosis not present

## 2017-05-16 DIAGNOSIS — Z902 Acquired absence of lung [part of]: Secondary | ICD-10-CM | POA: Insufficient documentation

## 2017-05-16 DIAGNOSIS — C73 Malignant neoplasm of thyroid gland: Secondary | ICD-10-CM | POA: Diagnosis not present

## 2017-05-16 DIAGNOSIS — I700 Atherosclerosis of aorta: Secondary | ICD-10-CM | POA: Diagnosis not present

## 2017-05-22 ENCOUNTER — Inpatient Hospital Stay: Payer: PPO | Attending: Internal Medicine | Admitting: Internal Medicine

## 2017-05-22 ENCOUNTER — Other Ambulatory Visit: Payer: Self-pay | Admitting: Family Medicine

## 2017-05-22 VITALS — BP 126/79 | HR 100 | Temp 97.8°F | Resp 16 | Wt 120.0 lb

## 2017-05-22 DIAGNOSIS — E785 Hyperlipidemia, unspecified: Secondary | ICD-10-CM

## 2017-05-22 DIAGNOSIS — Z85850 Personal history of malignant neoplasm of thyroid: Secondary | ICD-10-CM

## 2017-05-22 DIAGNOSIS — D696 Thrombocytopenia, unspecified: Secondary | ICD-10-CM

## 2017-05-22 DIAGNOSIS — I10 Essential (primary) hypertension: Secondary | ICD-10-CM | POA: Insufficient documentation

## 2017-05-22 DIAGNOSIS — C73 Malignant neoplasm of thyroid gland: Secondary | ICD-10-CM

## 2017-05-22 DIAGNOSIS — M8580 Other specified disorders of bone density and structure, unspecified site: Secondary | ICD-10-CM | POA: Diagnosis not present

## 2017-05-22 DIAGNOSIS — K219 Gastro-esophageal reflux disease without esophagitis: Secondary | ICD-10-CM | POA: Insufficient documentation

## 2017-05-22 DIAGNOSIS — Z85118 Personal history of other malignant neoplasm of bronchus and lung: Secondary | ICD-10-CM | POA: Diagnosis not present

## 2017-05-22 DIAGNOSIS — R0789 Other chest pain: Secondary | ICD-10-CM | POA: Diagnosis not present

## 2017-05-22 DIAGNOSIS — C3431 Malignant neoplasm of lower lobe, right bronchus or lung: Secondary | ICD-10-CM

## 2017-05-22 DIAGNOSIS — Z79899 Other long term (current) drug therapy: Secondary | ICD-10-CM | POA: Insufficient documentation

## 2017-05-22 MED ORDER — GABAPENTIN 100 MG PO CAPS: ORAL_CAPSULE | ORAL | 3 refills | Status: DC

## 2017-05-22 MED ORDER — LEVOTHYROXINE SODIUM 75 MCG PO TABS: 75.0000 ug | ORAL_TABLET | Freq: Every day | ORAL | 6 refills | Status: DC

## 2017-05-22 NOTE — Progress Notes (Signed)
Patient is here today for a follow up. Patient states that a few hours after taking Levothyroxine, she feels like her heart is racing. Patient states that her blood pressure tends to be elevated through out the day and that in the evenings she feels fatigued. Patient also reports having hot flashes.  Patient would like to discuss these symptoms and if they are related to thyroid medication.

## 2017-05-22 NOTE — Assessment & Plan Note (Addendum)
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 CT Chest July 2018- NED. Will repeat in 6 months/jan 2019.   # Thyroid Ca s/p thyroidectomy- STAGE I /LOW risk - on Synthroid 100 mcg /day; currently on 0.22. However patient is symptomatic-palpitations fatigue.  Will decrease the dose of synthroid to 14mcg/day [goal 0.1 to 0.5 ]. We will recheck thyroid panel in 2 months/[facility]  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 Recommend Neurontin; she will let us know if not improved then recommend ? Plastic/dermatology- steroid injections.  # osteopenia/ hypocalcemia- vit D+ ca BID; exercise.   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 Monitor for now.  # follow up in 2 months/thyroid profile CBC-[facility]  # I reviewed the blood work- with the patient in detail; also reviewed the imaging independently [as summarized above]; and with the patient in detail.

## 2017-05-22 NOTE — Progress Notes (Signed)
[facility] OFFICE PROGRESS NOTE  Patient Care Team: Tonia Ghent, MD as PCP - General (Family Medicine) Beverly Gust, MD (Unknown Physician Specialty) Nestor Lewandowsky, MD as Referring Physician (Cardiothoracic Surgery) Lorelee Cover., MD as Referring Physician (Ophthalmology) Cammie Sickle, MD as Consulting Physician (Internal Medicine) Hubbard Robinson, MD as Consulting Physician (Surgery)  Cancer Staging Thyroid cancer [facility]) Staging form: Thyroid - Papillary or Follicular (Under 45 years), AJCC 7th Edition - Clinical: Stage I (T1, N0, M0) - Signed by Forest Gleason, MD on 09/11/2015    Oncology History   # NOV 2016- RLL [place] ca T1N0 [incidental; Dr.Simonds; Bronc- s/p RLlobectomy; Dr.Oaks;Dec 2016]  # FEB 2017- 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 STAGE I; s/p Total Thyroidetcomy [Dr.Chapman];NO RAIU.  LOW RISK; but detectable thyroglobulin- on Synthroid     Cancer of lower lobe of right lung ([place])   05/06/2016 Initial Diagnosis    Malignant neoplasm of lower lobe, right bronchus or lung ([facility])       INTERVAL HISTORY:  [name] 71 y.o.  female pleasant patient above history of stage I lung cancer status post lobectomy; m and also history of thyroid cancer status post thyroidectomy currently on Synthroid is here for follow-up/ Is here to review the results of her restaging CT scans.  Patient interim was evaluated in emergency room for palpitations. Workup negative for any cardiac cause.  Patient complains of mild- moderate fatigue. She continues to be on Synthroid 100 micrograms per day. She continues to complain of intermittent palpitations. Denies any chest pain. Denies any shortness of breath or cough or swelling in the legs.  Patient complains of chronic pain/tingling and numbness/itching at the site of her thoracotomy.   REVIEW OF SYSTEMS:  A complete 10 point review  of system is done which is negative except mentioned above/history of present illness.   PAST MEDICAL HISTORY :  Past Medical History:  Diagnosis Date  . Cancer of lung ([place]) 10/01/2015  . GERD (gastroesophageal reflux disease)   . Groin pain    Along superior/laterl R inguinal canal.  Could be due to hernia or old scar tissue.  prev with CT done w/o alarming findings.  Will treat episodically.  Use tylenol #3 prn.    . Hyperlipidemia   . Hypertension   . Lipoma of thigh    Left  . Normal cardiac stress test 2014  . PONV (postoperative nausea and vomiting)    nausea after hysterectomy, and 2 days after lung surgery 10/2015  . Thyroid cancer ([place])   . Thyroid nodule     PAST SURGICAL HISTORY :   Past Surgical History:  Procedure Laterality Date  . ABDOMINAL HYSTERECTOMY  12-30-08  . APPENDECTOMY  11/23/2016   Dr. Adonis Huguenin  . BREAST EXCISIONAL BIOPSY Right 1977  . BREAST MASS EXCISION  1977   benign  . CARPAL TUNNEL RELEASE Right 1978  . CHOLECYSTECTOMY  11/23/2016   Procedure: LAPAROSCOPIC CHOLECYSTECTOMY;  Surgeon: Clayburn Pert, MD;  Location: [facility] ORS;  Service: General;;  . COLONOSCOPY  2009  . DILATION AND CURETTAGE OF UTERUS  2001  . ELECTROMAGNETIC NAVIGATION BROCHOSCOPY N/A 08/25/2015   Procedure: ELECTROMAGNETIC NAVIGATION BRONCHOSCOPY;  Surgeon: Flora Lipps, MD;  Location: [facility] ORS;  Service: Cardiopulmonary;  Laterality: N/A;  . ENDOBRONCHIAL ULTRASOUND N/A 08/25/2015   Procedure: ENDOBRONCHIAL ULTRASOUND;  Surgeon: Flora Lipps, MD;  Location: [facility] ORS;  Service: Cardiopulmonary;  Laterality: N/A;  .  ESOPHAGOGASTRODUODENOSCOPY  2002, 11/15/07   Normal (Dr. Allyn Kenner)  . FACIAL COSMETIC SURGERY  01/13/10   mini facelift  . HERNIA REPAIR  1976  . THORACOTOMY/LOBECTOMY Right 10/27/2015   Procedure: THORACOTOMY/LOBECTOMY;  Surgeon: Nestor Lewandowsky, MD;  Location: [facility] OR[facility];  Service: Thoracic;  Laterality: Right;  . THYROIDECTOMY N/A 12/29/2015   Procedure: THYROIDECTOMY;   Surgeon: Beverly Gust, MD;  Location: [facility] ORS;  Service: ENT;  Laterality: N/A;  . TOTAL VAGINAL HYSTERECTOMY  12/30/08   for fibroid pain (Dr. Gertie Fey)    FAMILY HISTORY :   Family History  Problem Relation Age of Onset  . Transient ischemic attack Mother   . Hypertension Mother   . Stroke Mother        mini strokes  . Hypertension Sister   . Cancer Sister        ovarian  . Hypertension Sister   . Hypertension Sister   . Hypertension Sister   . Breast cancer Neg Hx   . Colon cancer Neg Hx     SOCIAL HISTORY:   Social History  Substance Use Topics  . Smoking status: Never Smoker  . Smokeless tobacco: Never Used  . Alcohol use No    ALLERGIES:  is allergic to atorvastatin; ciprofloxacin; epinephrine; metoprolol succinate; simvastatin; and sulfonamide derivatives.  MEDICATIONS:  Current Outpatient Prescriptions  Medication Sig Dispense Refill  . Ascorbic Acid (VITAMIN C) 500 MG tablet Take 2,000 mg by mouth daily. Reported on 02/04/2016    . Biotin (PA BIOTIN) 1000 MCG tablet Take 1,000 mcg by mouth daily. Reported on 02/04/2016    . Cholecalciferol (VITAMIN D-3) 1000 units CAPS Take 1 capsule by mouth daily with supper.     . Coenzyme Q10 (COQ10) 200 MG CAPS Take 200 mg by mouth daily with supper.    . Folic Acid-Vit C7-ELF Y10 (FA-VITAMIN B-6-VITAMIN B-12 PO) Take 1 tablet by mouth daily after breakfast. Reported on 02/04/2016    . levothyroxine (SYNTHROID, LEVOTHROID) 75 MCG tablet Take 1 tablet (75 mcg total) by mouth daily before breakfast. 30 tablet 6  . Lutein 20 MG CAPS Take 20 mg by mouth daily.    . Omega-3 Fatty Acids (SUPER OMEGA 3 EPA/DHA) 1000 MG CAPS Take 1,000 mg by mouth daily with supper.    Marland Kitchen omeprazole (PRILOSEC) 40 MG capsule Take 1 capsule (40 mg total) by mouth daily. 90 capsule 3  . OVER THE COUNTER MEDICATION Take 1 tablet by mouth daily with supper. calcium 500 mg and K 40 mcg    . Potassium Gluconate 595 MG CAPS Take 595 mg by mouth daily.     .  pravastatin (PRAVACHOL) 40 MG tablet Take 1 tablet (40 mg total) by mouth daily. (Patient taking differently: Take 40 mg by mouth at bedtime. ) 90 tablet 3  . gabapentin (NEURONTIN) 100 MG capsule Take one pill in am; and in 1 pill pm; if not improved then take 2 in pm 90 capsule 3   No current facility-administered medications for this visit.     PHYSICAL EXAMINATION: ECOG PERFORMANCE STATUS: 0 - Asymptomatic  BP 126/79 (BP Location: Left Arm)   Pulse 100   Temp 97.8 F (36.6 C) (Tympanic)   Resp 16   Wt 120 lb (54.4 kg)   BMI 20.60 kg/m   Filed Weights   05/22/17 1407  Weight: 120 lb (54.4 kg)    GENERAL: Well-nourished well-developed; Alert, no distress and comfortable.   Accompanied by husband. EYES: no pallor or icterus OROPHARYNX:  no thrush or ulceration; good dentition  NECK: supple, no masses felt LYMPH:  no palpable lymphadenopathy in the cervical, axillary or inguinal regions LUNGS: clear to auscultation and  No wheeze or crackles HEART/CVS: regular rate & rhythm and no murmurs; No lower extremity edema ABDOMEN:abdomen soft, non-tender and normal bowel sounds Musculoskeletal:no cyanosis of digits and no clubbing  PSYCH: alert & oriented x 3 with fluent speech NEURO: no focal motor/sensory deficits SKIN:  no rashes or significant lesions  LABORATORY DATA:  I have reviewed the data as listed    Component Value Date/Time   NA 140 03/20/2017 1251   NA 141 07/01/2013 0103   K 3.9 03/20/2017 1251   K 3.8 07/01/2013 0103   CL 106 03/20/2017 1251   CL 110 (H) 07/01/2013 0103   CO2 29 03/20/2017 1251   CO2 27 07/01/2013 0103   GLUCOSE 111 (H) 03/20/2017 1251   GLUCOSE 88 07/01/2013 0103   BUN 18 03/20/2017 1251   BUN 11 07/01/2013 0103   CREATININE 0.66 03/20/2017 1251   CREATININE 0.68 07/01/2013 0103   CALCIUM 9.0 03/20/2017 1251   CALCIUM 8.2 (L) 07/01/2013 0103   PROT 6.4 (L) 03/20/2017 1251   PROT 5.5 (L) 07/01/2013 0103   ALBUMIN 3.9 03/20/2017 1251    ALBUMIN 3.0 (L) 07/01/2013 0103   AST 28 03/20/2017 1251   AST 19 07/01/2013 0103   ALT 34 03/20/2017 1251   ALT 24 07/01/2013 0103   ALKPHOS 43 03/20/2017 1251   ALKPHOS 55 07/01/2013 0103   BILITOT 0.5 03/20/2017 1251   BILITOT 0.7 07/01/2013 0103   GFRNONAA >60 03/20/2017 1251   GFRNONAA >60 07/01/2013 0103   GFRAA >60 03/20/2017 1251   GFRAA >60 07/01/2013 0103    No results found for: SPEP, UPEP  Lab Results  Component Value Date   WBC 6.6 03/20/2017   NEUTROABS 3.7 11/11/2016   HGB 13.8 03/20/2017   HCT 40.7 03/20/2017   MCV 89.1 03/20/2017   PLT 135 (L) 03/20/2017      Chemistry      Component Value Date/Time   NA 140 03/20/2017 1251   NA 141 07/01/2013 0103   K 3.9 03/20/2017 1251   K 3.8 07/01/2013 0103   CL 106 03/20/2017 1251   CL 110 (H) 07/01/2013 0103   CO2 29 03/20/2017 1251   CO2 27 07/01/2013 0103   BUN 18 03/20/2017 1251   BUN 11 07/01/2013 0103   CREATININE 0.66 03/20/2017 1251   CREATININE 0.68 07/01/2013 0103      Component Value Date/Time   CALCIUM 9.0 03/20/2017 1251   CALCIUM 8.2 (L) 07/01/2013 0103   ALKPHOS 43 03/20/2017 1251   ALKPHOS 55 07/01/2013 0103   AST 28 03/20/2017 1251   AST 19 07/01/2013 0103   ALT 34 03/20/2017 1251   ALT 24 07/01/2013 0103   BILITOT 0.5 03/20/2017 1251   BILITOT 0.7 07/01/2013 0103       RADIOGRAPHIC STUDIES: I have personally reviewed the radiological images as listed and agreed with the findings in the report. No results found.   ASSESSMENT & PLAN:  Cancer of lower lobe of right lung ([place]) # RLL Lung ca; stage I no adjuvant therapy. CT Chest July 2018- NED. Will repeat in 6 months/jan 2019.   # Thyroid Ca s/p thyroidectomy- STAGE I /LOW risk - on Synthroid 100 mcg /day; currently on 0.22. However patient is symptomatic-palpitations fatigue.  Will decrease the dose of synthroid to 42mcg/day [goal 0.1 to  0.5 ]. We will recheck thyroid panel in 2 months/[facility]  # Right chest wall pain-  neuropathic from chest wall surgery. Recommend Neurontin; she will let us know if not improved then recommend ? Plastic/dermatology- steroid injections.  # osteopenia/ hypocalcemia- vit D+ ca BID; exercise.   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 Monitor for now.  # follow up in 2 months/thyroid profile [facility]  # I reviewed the blood work- with the patient in detail; also reviewed the imaging independently [as summarized above]; and with the patient in detail.    No orders of the defined types were placed in this encounter.  All questions were answered. The patient knows to call the clinic with any problems, questions or concerns.      Cammie Sickle, MD 05/22/2017 4:22 PM

## 2017-05-23 ENCOUNTER — Other Ambulatory Visit (INDEPENDENT_AMBULATORY_CARE_PROVIDER_SITE_OTHER): Payer: PPO

## 2017-05-23 ENCOUNTER — Ambulatory Visit (INDEPENDENT_AMBULATORY_CARE_PROVIDER_SITE_OTHER): Payer: PPO

## 2017-05-23 VITALS — BP 150/76 | HR 83 | Ht 64.0 in | Wt 118.8 lb

## 2017-05-23 DIAGNOSIS — E785 Hyperlipidemia, unspecified: Secondary | ICD-10-CM

## 2017-05-23 DIAGNOSIS — Z0000 Encounter for general adult medical examination without abnormal findings: Secondary | ICD-10-CM | POA: Diagnosis not present

## 2017-05-23 LAB — LIPID PANEL
Cholesterol: 144 mg/dL (ref 0–200)
HDL: 37.5 mg/dL — ABNORMAL LOW (ref >39.00)
LDL Cholesterol: 70 mg/dL (ref 0–99)
NonHDL: 106.38
Total CHOL/HDL Ratio: 4
Triglycerides: 184 mg/dL — ABNORMAL HIGH (ref 0.0–149.0)
VLDL: 36.8 mg/dL (ref 0.0–40.0)

## 2017-05-23 LAB — GLUCOSE, RANDOM: Glucose, Bld: 112 mg/dL — ABNORMAL HIGH (ref 70–99)

## 2017-05-23 NOTE — Patient Instructions (Addendum)
Ms. Coryell , Thank you for taking time to come for your Medicare Wellness Visit. I appreciate your ongoing commitment to your health goals. Please review the following plan we discussed and let me know if I can assist you in the future.   These are the goals we discussed: Goals    . Increase physical activity (pt-stated)          Starting 05/23/2017, I will continue to exercise for at least 30 min daily.        This is a list of the screening recommended for you and due dates:  Health Maintenance  Topic Date Due  . Flu Shot  05/31/2017  . Mammogram  06/07/2018  . Colon Cancer Screening  09/15/2018  . Tetanus Vaccine  02/12/2019  . DEXA scan (bone density measurement)  Completed  .  Hepatitis C: One time screening is recommended by Center for Disease Control  (CDC) for  adults born from [birth year] through 1967.   Completed  . Pneumonia vaccines  Completed   Preventive Care for Adults  A healthy lifestyle and preventive care can promote health and wellness. Preventive health guidelines for adults include the following key practices.  . A routine yearly physical is a good way to check with your health care provider about your health and preventive screening. It is a chance to share any concerns and updates on your health and to receive a thorough exam.  . Visit your dentist for a routine exam and preventive care every 6 months. Brush your teeth twice a day and floss once a day. Good oral hygiene prevents tooth decay and gum disease.  . The frequency of eye exams is based on your age, health, family medical history, use  of contact lenses, and other factors. Follow your health care provider's ecommendations for frequency of eye exams.  . Eat a healthy diet. Foods like vegetables, fruits, whole grains, low-fat dairy products, and lean protein foods contain the nutrients you need without too many calories. Decrease your intake of foods high in solid fats, added sugars, and salt. Eat the right  amount of calories for you. Get information about a proper diet from your health care provider, if necessary.  . Regular physical exercise is one of the most important things you can do for your health. Most adults should get at least 150 minutes of moderate-intensity exercise (any activity that increases your heart rate and causes you to sweat) each week. In addition, most adults need muscle-strengthening exercises on 2 or more days a week.  Silver Sneakers may be a benefit available to you. To determine eligibility, you may visit the website: www.silversneakers.com or contact program at [phone] Mon-Fri between 8AM-8PM.   . Maintain a healthy weight. The body mass index (BMI) is a screening tool to identify possible weight problems. It provides an estimate of body fat based on height and weight. Your health care provider can find your BMI and can help you achieve or maintain a healthy weight.   For adults 20 years and older: ? A BMI below 18.5 is considered underweight. ? A BMI of 18.5 to 24.9 is normal. ? A BMI of 25 to 29.9 is considered overweight. ? A BMI of 30 and above is considered obese.   . Maintain normal blood lipids and cholesterol levels by exercising and minimizing your intake of saturated fat. Eat a balanced diet with plenty of fruit and vegetables. Blood tests for lipids and cholesterol should begin at age 90 and  be repeated every 5 years. If your lipid or cholesterol levels are high, you are over 50, or you are at high risk for heart disease, you may need your cholesterol levels checked more frequently. Ongoing high lipid and cholesterol levels should be treated with medicines if diet and exercise are not working.  . If you smoke, find out from your health care provider how to quit. If you do not use tobacco, please do not start.  . If you choose to drink alcohol, please do not consume more than 2 drinks per day. One drink is considered to be 12 ounces (355 mL) of beer, 5  ounces (148 mL) of wine, or 1.5 ounces (44 mL) of liquor.  . If you are 39-53 years old, ask your health care provider if you should take aspirin to prevent strokes.  . Use sunscreen. Apply sunscreen liberally and repeatedly throughout the day. You should seek shade when your shadow is shorter than you. Protect yourself by wearing long sleeves, pants, a wide-brimmed hat, and sunglasses year round, whenever you are outdoors.  . Once a month, do a whole body skin exam, using a mirror to look at the skin on your back. Tell your health care provider of new moles, moles that have irregular borders, moles that are larger than a pencil eraser, or moles that have changed in shape or color.

## 2017-05-24 ENCOUNTER — Other Ambulatory Visit: Payer: Self-pay | Admitting: Family Medicine

## 2017-05-24 NOTE — Telephone Encounter (Signed)
Received refill electronically Upcoming appointment 05/30/17 See allergy/contraindication

## 2017-05-25 ENCOUNTER — Encounter: Payer: PPO | Admitting: Family Medicine

## 2017-05-25 NOTE — Telephone Encounter (Signed)
Sent. Thanks.   

## 2017-05-30 ENCOUNTER — Ambulatory Visit (INDEPENDENT_AMBULATORY_CARE_PROVIDER_SITE_OTHER): Payer: PPO | Admitting: Family Medicine

## 2017-05-30 ENCOUNTER — Encounter: Payer: Self-pay | Admitting: Family Medicine

## 2017-05-30 VITALS — BP 110/76 | HR 84 | Temp 97.7°F | Ht 64.0 in | Wt 119.0 lb

## 2017-05-30 DIAGNOSIS — E785 Hyperlipidemia, unspecified: Secondary | ICD-10-CM | POA: Diagnosis not present

## 2017-05-30 DIAGNOSIS — C73 Malignant neoplasm of thyroid gland: Secondary | ICD-10-CM

## 2017-05-30 DIAGNOSIS — C3431 Malignant neoplasm of lower lobe, right bronchus or lung: Secondary | ICD-10-CM

## 2017-05-30 DIAGNOSIS — K219 Gastro-esophageal reflux disease without esophagitis: Secondary | ICD-10-CM

## 2017-05-30 DIAGNOSIS — I700 Atherosclerosis of aorta: Secondary | ICD-10-CM

## 2017-05-30 DIAGNOSIS — Z7189 Other specified counseling: Secondary | ICD-10-CM

## 2017-05-30 MED ORDER — OMEPRAZOLE 40 MG PO CPDR: 40.0000 mg | DELAYED_RELEASE_CAPSULE | Freq: Every day | ORAL | 3 refills | Status: DC

## 2017-05-30 NOTE — Progress Notes (Signed)
Discussed CT results from 05/16/2017, specifically dx of aortic atherosclerosis measures she should take regarding it---Patient underwent a nuclear medicine stress test which showed no evidence of acute myocardial ischemia.  She doesn't have exertional sx o/w.  D/w pt.  would continue current medications, statin etc. Discussed with patient.  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 She has not started rx yet, but will consider it.  BP prev elevated, but better today.  Home checks are controlled.  D/w pt.    She had noted inc in pulse with pulse up to 90s, never >100.  Some fatigue.  She recently had dec in thyroid replacement which could have contributed.  I'll defer recheck to oncology, d/w pt.    Elevated Cholesterol: Using medications without problems:yes Muscle aches: no Diet compliance:yes Exercise:yes  GERD controlled on PPI.  D/w pt about trying to taper slowly, ie skip a dose per week.    She is still caring for her husband at baseline.    Advance directive- d/w pt.  Has a living will.  Husband, then patient's sister Vicente Males, then her son designated in that order should patient be incapacitated. Updated today.   PMH and SH reviewed  ROS: Per HPI unless specifically indicated in ROS section   Meds, vitals, and allergies reviewed.   GEN: nad, alert and oriented HEENT: mucous membranes moist NECK: supple w/o LA CV: rrr PULM: ctab, no inc wob, chest wall with scar noted on the right side with granulation tissue ABD: soft, +bs EXT: no edema SKIN: no acute rash

## 2017-05-30 NOTE — Assessment & Plan Note (Addendum)
Advance directive- d/w pt.  Has a living will.  Husband, then patient's sister Vicente Males, then her son designated in that order should patient be incapacitated. Updated today.

## 2017-05-30 NOTE — Patient Instructions (Addendum)
Check with your insurance to see if they will cover the shingrix shot- check with your other docs to make sure they are okay with you getting the shot.  Try to slowly taper down on the prilosec.  I would get a flu shot each fall.   Take care.  Glad to see you.

## 2017-05-31 DIAGNOSIS — I700 Atherosclerosis of aorta: Secondary | ICD-10-CM | POA: Insufficient documentation

## 2017-05-31 NOTE — Assessment & Plan Note (Signed)
Continue statin and work on diet and exercise. Labs discussed with patient. She agrees.

## 2017-05-31 NOTE — Assessment & Plan Note (Signed)
She can try slow taper of PPI. Discussed with patient. See after visit summary. She agrees.

## 2017-05-31 NOTE — Assessment & Plan Note (Signed)
Status post treatment and she can consider treatment with gabapentin for likely neuropathic pain in the chest wall.

## 2017-05-31 NOTE — Assessment & Plan Note (Addendum)
She has aortic atherosclerosis and incidental coronary calcifications noted on CT. She has no chest pain. She already passed a nuclear stress test earlier this year. No exertional chest pain. Continue statin and risk factor modification. Discussed with patient. She agrees. >25 minutes spent in face to face time with patient, >50% spent in counselling or coordination of care.

## 2017-05-31 NOTE — Assessment & Plan Note (Signed)
With ongoing replacement per oncology, I will defer.

## 2017-06-06 ENCOUNTER — Encounter: Payer: Self-pay | Admitting: Family Medicine

## 2017-06-06 ENCOUNTER — Ambulatory Visit (INDEPENDENT_AMBULATORY_CARE_PROVIDER_SITE_OTHER): Payer: PPO | Admitting: Family Medicine

## 2017-06-06 DIAGNOSIS — J069 Acute upper respiratory infection, unspecified: Secondary | ICD-10-CM | POA: Diagnosis not present

## 2017-06-06 MED ORDER — HYDROCODONE-HOMATROPINE 5-1.5 MG/5ML PO SYRP: 5.0000 mL | ORAL_SOLUTION | Freq: Three times a day (TID) | ORAL | 0 refills | Status: DC | PRN

## 2017-06-06 MED ORDER — AMOXICILLIN-POT CLAVULANATE 875-125 MG PO TABS: 1.0000 | ORAL_TABLET | Freq: Two times a day (BID) | ORAL | 0 refills | Status: DC

## 2017-06-06 MED ORDER — BENZONATATE 200 MG PO CAPS
200.0000 mg | ORAL_CAPSULE | Freq: Three times a day (TID) | ORAL | 1 refills | Status: DC | PRN
Start: 2017-06-06 — End: 2017-07-24

## 2017-06-06 NOTE — Progress Notes (Signed)
Started about 3 days ago with cough, then hoarse in the meantime.  Cough is worse today.  R lower posterior chest wall pain.  Tried cough drops, etc. No fevers.  Some upper tooth pain.  Some rhinorrhea, clear.    Meds, vitals, and allergies reviewed.   ROS: Per HPI unless specifically indicated in ROS section   GEN: nad, alert and oriented HEENT: mucous membranes moist, tm w/o erythema, nasal exam w/o erythema, clear discharge noted,  OP with cobblestoning, Max sinuses mildly ttp B, hoarse voice.  NECK: supple w/o LA CV: rrr.   PULM: ctab, no inc wob EXT: no edema SKIN: no acute rash

## 2017-06-06 NOTE — Patient Instructions (Addendum)
Use tessalon for cough.  If needed, then use hycodan- sedation caution.  Rest your voice, gargle with salt water if you can.  If you have more tooth or facial pain then start the antibiotics.   Take care.  Glad to see you.

## 2017-06-07 DIAGNOSIS — J069 Acute upper respiratory infection, unspecified: Secondary | ICD-10-CM | POA: Insufficient documentation

## 2017-06-07 NOTE — Assessment & Plan Note (Signed)
This could still be viral. Discussed with patient. Reasonable to use tessalon for cough.  If needed, then use hycodan- sedation caution.  Rest her voice, gargle with salt water when she can.  If more tooth or facial pain then start augmentin.  Nontoxic, okay for outpatient follow-up.

## 2017-06-08 ENCOUNTER — Ambulatory Visit (INDEPENDENT_AMBULATORY_CARE_PROVIDER_SITE_OTHER): Payer: PPO | Admitting: Family Medicine

## 2017-06-08 DIAGNOSIS — J069 Acute upper respiratory infection, unspecified: Secondary | ICD-10-CM | POA: Diagnosis not present

## 2017-06-08 MED ORDER — ALBUTEROL SULFATE HFA 108 (90 BASE) MCG/ACT IN AERS: 1.0000 | INHALATION_SPRAY | Freq: Four times a day (QID) | RESPIRATORY_TRACT | 0 refills | Status: DC | PRN

## 2017-06-08 NOTE — Progress Notes (Signed)
F/u for cough, continues.  Hydrocodone helps some with the cough, temporarily.  Laying flat makes the cough worse.  No fevers.  Sore from coughing.  She thought the cough was coming from the throat, not from down in her chest.  Voice is still intermittently hoarse.  Using cough drops, hot tea, w/o much relief overall.  No fevers documented but felt hot last night.  She started the abx in the meantime. No sputum.  Still with some upper tooth pain.    Unclear how much tessalon helped, d/w pt.  Still not on gabapentin.    Meds, vitals, and allergies reviewed.   ROS: Per HPI unless specifically indicated in ROS section   GEN: nad, alert and oriented HEENT: mucous membranes moist, tm w/o erythema, nasal exam w/o erythema, clear discharge noted,  OP with cobblestoning NECK: supple w/o LA CV: rrr.   PULM: ctab, no inc wob EXT: no edema SKIN: no acute rash

## 2017-06-08 NOTE — Patient Instructions (Addendum)
It wouldn't be a bad idea to try the gabapentin at night to see if that helps the cough.  Keep drinking plenty of fluids in the meantime.  Use the inhaler if the gabapentin doesn't help.  Take care.  Glad to see you.

## 2017-06-09 NOTE — Assessment & Plan Note (Signed)
Still nontoxic. Okay for outpatient follow-up. Discussed with patient about options. Okay to try gabapentin for the cough, just to see if that makes any difference. Also okay to try albuterol inhaler if needed. Routine instructions given on both. Lungs are still clear. Would finished the antibiotic course. Update me as needed. She agrees.

## 2017-06-12 ENCOUNTER — Ambulatory Visit: Payer: PPO

## 2017-06-19 ENCOUNTER — Ambulatory Visit
Admission: RE | Admit: 2017-06-19 | Discharge: 2017-06-19 | Disposition: A | Discharge status: Home / Self Care | Payer: PPO | Source: Ambulatory Visit | Attending: Family Medicine | Admitting: Family Medicine

## 2017-06-19 DIAGNOSIS — Z1231 Encounter for screening mammogram for malignant neoplasm of breast: Secondary | ICD-10-CM | POA: Diagnosis not present

## 2017-07-12 DIAGNOSIS — C73 Malignant neoplasm of thyroid gland: Secondary | ICD-10-CM | POA: Diagnosis not present

## 2017-07-17 DIAGNOSIS — H2513 Age-related nuclear cataract, bilateral: Secondary | ICD-10-CM | POA: Diagnosis not present

## 2017-07-17 DIAGNOSIS — H52223 Regular astigmatism, bilateral: Secondary | ICD-10-CM | POA: Diagnosis not present

## 2017-07-20 ENCOUNTER — Encounter: Payer: Self-pay | Admitting: Family Medicine

## 2017-07-24 ENCOUNTER — Inpatient Hospital Stay: Payer: PPO | Attending: Internal Medicine | Admitting: Internal Medicine

## 2017-07-24 VITALS — BP 131/81 | HR 73 | Temp 97.6°F | Resp 20 | Ht 64.0 in | Wt 120.0 lb

## 2017-07-24 DIAGNOSIS — K219 Gastro-esophageal reflux disease without esophagitis: Secondary | ICD-10-CM | POA: Diagnosis not present

## 2017-07-24 DIAGNOSIS — C3431 Malignant neoplasm of lower lobe, right bronchus or lung: Secondary | ICD-10-CM

## 2017-07-24 DIAGNOSIS — M8580 Other specified disorders of bone density and structure, unspecified site: Secondary | ICD-10-CM | POA: Insufficient documentation

## 2017-07-24 DIAGNOSIS — Z85118 Personal history of other malignant neoplasm of bronchus and lung: Secondary | ICD-10-CM | POA: Insufficient documentation

## 2017-07-24 DIAGNOSIS — I10 Essential (primary) hypertension: Secondary | ICD-10-CM | POA: Insufficient documentation

## 2017-07-24 DIAGNOSIS — E785 Hyperlipidemia, unspecified: Secondary | ICD-10-CM | POA: Diagnosis not present

## 2017-07-24 DIAGNOSIS — Z79899 Other long term (current) drug therapy: Secondary | ICD-10-CM | POA: Diagnosis not present

## 2017-07-24 DIAGNOSIS — C73 Malignant neoplasm of thyroid gland: Secondary | ICD-10-CM | POA: Diagnosis not present

## 2017-07-24 DIAGNOSIS — R0789 Other chest pain: Secondary | ICD-10-CM | POA: Diagnosis not present

## 2017-07-24 DIAGNOSIS — D696 Thrombocytopenia, unspecified: Secondary | ICD-10-CM

## 2017-07-24 MED ORDER — LEVOTHYROXINE SODIUM 75 MCG PO TABS: 75.0000 ug | ORAL_TABLET | Freq: Every day | ORAL | 6 refills | Status: DC

## 2017-07-24 NOTE — Progress Notes (Signed)
[place] OFFICE PROGRESS NOTE  Patient Care Team: Tonia Ghent, MD as PCP - General (Family Medicine) Beverly Gust, MD as Consulting Physician (Otolaryngology) Nestor Lewandowsky, MD as Consulting Physician (Cardiothoracic Surgery) Cammie Sickle, MD as Consulting Physician (Internal Medicine) Birder Robson, MD as Consulting Physician (Ophthalmology) Clayburn Pert, MD as Consulting Physician (General Surgery)  Cancer Staging Thyroid cancer [facility]) Staging form: Thyroid - Papillary or Follicular (Under 45 years), AJCC 7th Edition - Clinical: Stage I (T1, N0, M0) - Signed by Forest Gleason, MD on 09/11/2015    Oncology History   # NOV 2016- RLL [place] ca T1N0 [incidental; Dr.Simonds; Bronc- s/p RLlobectomy; Dr.Oaks;Dec 2016]  # FEB 2017- 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 STAGE I; s/p Total Thyroidetcomy [Dr.Chapman];NO RAIU.  LOW RISK; but detectable thyroglobulin- on Synthroid     Cancer of lower lobe of right lung ([place])   05/06/2016 Initial Diagnosis    Malignant neoplasm of lower lobe, right bronchus or lung ([facility])       INTERVAL HISTORY:  [name] 71 y.o.  female pleasant patient above history of stage I lung cancer status post lobectomy; m and also history of thyroid cancer status post thyroidectomy currently on Synthroid is here for follow-up.  Patient complains of less fatigue. Denies having any palpitations since cutting down on the dose of the Synthroid. Patient is currently on Synthroid 75 g a day.  Denies any chest pain. Denies any shortness of breath or cough or swelling in the legs.  Patient complains of chronic pain/tingling and numbness/itching at the site of her thoracotomy- however this is improved since started taking Neurontin at nighttime.  REVIEW OF SYSTEMS:  A complete 10 point review of system is done which is negative except mentioned above/history of  present illness.   PAST MEDICAL HISTORY :  Past Medical History:  Diagnosis Date  . Cancer of lung ([place]) 10/01/2015  . GERD (gastroesophageal reflux disease)   . Groin pain    Along superior/laterl R inguinal canal.  Could be due to hernia or old scar tissue.  prev with CT done w/o alarming findings.  Will treat episodically.  Use tylenol #3 prn.    . Hyperlipidemia   . Hypertension   . Lipoma of thigh    Left  . Normal cardiac stress test 2014  . PONV (postoperative nausea and vomiting)    nausea after hysterectomy, and 2 days after lung surgery 10/2015  . Thyroid cancer ([name])   . Thyroid nodule     PAST SURGICAL HISTORY :   Past Surgical History:  Procedure Laterality Date  . ABDOMINAL HYSTERECTOMY  12-30-08  . APPENDECTOMY  11/23/2016   Dr. Adonis Huguenin  . BREAST EXCISIONAL BIOPSY Right 1977  . BREAST MASS EXCISION  1977   benign  . CARPAL TUNNEL RELEASE Right 1978  . CHOLECYSTECTOMY  11/23/2016   Procedure: LAPAROSCOPIC CHOLECYSTECTOMY;  Surgeon: Clayburn Pert, MD;  Location: [facility] ORS;  Service: General;;  . COLONOSCOPY  2009  . DILATION AND CURETTAGE OF UTERUS  2001  . ELECTROMAGNETIC NAVIGATION BROCHOSCOPY N/A 08/25/2015   Procedure: ELECTROMAGNETIC NAVIGATION BRONCHOSCOPY;  Surgeon: Flora Lipps, MD;  Location: [facility] ORS;  Service: Cardiopulmonary;  Laterality: N/A;  . ENDOBRONCHIAL ULTRASOUND N/A 08/25/2015   Procedure: ENDOBRONCHIAL ULTRASOUND;  Surgeon: Flora Lipps, MD;  Location: [facility] ORS;  Service: Cardiopulmonary;  Laterality: N/A;  . ESOPHAGOGASTRODUODENOSCOPY  2002, 11/15/07   Normal (Dr. Allyn Kenner)  . FACIAL COSMETIC  SURGERY  01/13/10   mini facelift  . HERNIA REPAIR  1976  . THORACOTOMY/LOBECTOMY Right 10/27/2015   Procedure: THORACOTOMY/LOBECTOMY;  Surgeon: Nestor Lewandowsky, MD;  Location: [facility] ORS;  Service: Thoracic;  Laterality: Right;  . THYROIDECTOMY N/A 12/29/2015   Procedure: THYROIDECTOMY;  Surgeon: Beverly Gust, MD;  Location: [facility] ORS;  Service: ENT;   Laterality: N/A;  . TOTAL VAGINAL HYSTERECTOMY  12/30/08   for fibroid pain (Dr. Gertie Fey)    FAMILY HISTORY :   Family History  Problem Relation Age of Onset  . Transient ischemic attack Mother   . Hypertension Mother   . Stroke Mother        mini strokes  . Hypertension Sister   . Cancer Sister        ovarian  . Hypertension Sister   . Hypertension Sister   . Hypertension Sister   . Breast cancer Neg Hx   . Colon cancer Neg Hx     SOCIAL HISTORY:   Social History  Substance Use Topics  . Smoking status: Never Smoker  . Smokeless tobacco: Never Used  . Alcohol use No    ALLERGIES:  is allergic to atorvastatin; ciprofloxacin; epinephrine; metoprolol succinate; simvastatin; and sulfonamide derivatives.  MEDICATIONS:  Current Outpatient Prescriptions  Medication Sig Dispense Refill  . Ascorbic Acid (VITAMIN C) 500 MG tablet Take 2,000 mg by mouth daily. Reported on 02/04/2016    . Biotin (PA BIOTIN) 1000 MCG tablet Take 1,000 mcg by mouth daily. Reported on 02/04/2016    . Cholecalciferol (VITAMIN D-3) 1000 units CAPS Take 1 capsule by mouth daily with supper.     . Coenzyme Q10 (COQ10) 200 MG CAPS Take 200 mg by mouth daily with supper.    . Folic Acid-Vit Z6-XWR U04 (FA-VITAMIN B-6-VITAMIN B-12 PO) Take 1 tablet by mouth daily after breakfast. Reported on 02/04/2016    . gabapentin (NEURONTIN) 100 MG capsule Take one pill in am; and in 1 pill pm; if not improved then take 2 in pm (Patient taking differently: Take 100 mg by mouth at bedtime. ) 90 capsule 3  . levothyroxine (SYNTHROID, LEVOTHROID) 75 MCG tablet Take 1 tablet (75 mcg total) by mouth daily before breakfast. 90 tablet 6  . Lutein 20 MG CAPS Take 20 mg by mouth daily.    . Omega-3 Fatty Acids (SUPER OMEGA 3 EPA/DHA) 1000 MG CAPS Take 1,000 mg by mouth daily with supper.    Marland Kitchen omeprazole (PRILOSEC) 40 MG capsule Take 1 capsule (40 mg total) by mouth daily. 90 capsule 3  . OVER THE COUNTER MEDICATION Take 1 tablet by mouth  daily with supper. calcium 500 mg and K 40 mcg    . Potassium Gluconate 595 MG CAPS Take 595 mg by mouth daily.     . pravastatin (PRAVACHOL) 40 MG tablet TAKE 1 TABLET (40 MG TOTAL) BY MOUTH DAILY. 90 tablet 3   No current facility-administered medications for this visit.     PHYSICAL EXAMINATION: ECOG PERFORMANCE STATUS: 0 - Asymptomatic  BP 131/81 (BP Location: Left Arm, Patient Position: Sitting)   Pulse 73   Temp 97.6 F (36.4 C) (Tympanic)   Resp 20   Ht 5\' 4"  (1.626 m)   Wt 120 lb (54.4 kg)   BMI 20.60 kg/m   Filed Weights   07/24/17 1504  Weight: 120 lb (54.4 kg)    GENERAL: Well-nourished well-developed; Alert, no distress and comfortable.   Accompanied by husband. EYES: no pallor or icterus OROPHARYNX: no thrush or  ulceration; good dentition  NECK: supple, no masses felt LYMPH:  no palpable lymphadenopathy in the cervical, axillary or inguinal regions LUNGS: clear to auscultation and  No wheeze or crackles HEART/CVS: regular rate & rhythm and no murmurs; No lower extremity edema ABDOMEN:abdomen soft, non-tender and normal bowel sounds Musculoskeletal:no cyanosis of digits and no clubbing  PSYCH: alert & oriented x 3 with fluent speech NEURO: no focal motor/sensory deficits SKIN:  no rashes or significant lesions  LABORATORY DATA:  I have reviewed the data as listed    Component Value Date/Time   NA 140 03/20/2017 1251   NA 141 07/01/2013 0103   K 3.9 03/20/2017 1251   K 3.8 07/01/2013 0103   CL 106 03/20/2017 1251   CL 110 (H) 07/01/2013 0103   CO2 29 03/20/2017 1251   CO2 27 07/01/2013 0103   GLUCOSE 112 (H) 05/23/2017 1119   GLUCOSE 88 07/01/2013 0103   BUN 18 03/20/2017 1251   BUN 11 07/01/2013 0103   CREATININE 0.66 03/20/2017 1251   CREATININE 0.68 07/01/2013 0103   CALCIUM 9.0 03/20/2017 1251   CALCIUM 8.2 (L) 07/01/2013 0103   PROT 6.4 (L) 03/20/2017 1251   PROT 5.5 (L) 07/01/2013 0103   ALBUMIN 3.9 03/20/2017 1251   ALBUMIN 3.0 (L)  07/01/2013 0103   AST 28 03/20/2017 1251   AST 19 07/01/2013 0103   ALT 34 03/20/2017 1251   ALT 24 07/01/2013 0103   ALKPHOS 43 03/20/2017 1251   ALKPHOS 55 07/01/2013 0103   BILITOT 0.5 03/20/2017 1251   BILITOT 0.7 07/01/2013 0103   GFRNONAA >60 03/20/2017 1251   GFRNONAA >60 07/01/2013 0103   GFRAA >60 03/20/2017 1251   GFRAA >60 07/01/2013 0103    No results found for: SPEP, UPEP  Lab Results  Component Value Date   WBC 6.6 03/20/2017   NEUTROABS 3.7 11/11/2016   HGB 13.8 03/20/2017   HCT 40.7 03/20/2017   MCV 89.1 03/20/2017   PLT 135 (L) 03/20/2017      Chemistry      Component Value Date/Time   NA 140 03/20/2017 1251   NA 141 07/01/2013 0103   K 3.9 03/20/2017 1251   K 3.8 07/01/2013 0103   CL 106 03/20/2017 1251   CL 110 (H) 07/01/2013 0103   CO2 29 03/20/2017 1251   CO2 27 07/01/2013 0103   BUN 18 03/20/2017 1251   BUN 11 07/01/2013 0103   CREATININE 0.66 03/20/2017 1251   CREATININE 0.68 07/01/2013 0103      Component Value Date/Time   CALCIUM 9.0 03/20/2017 1251   CALCIUM 8.2 (L) 07/01/2013 0103   ALKPHOS 43 03/20/2017 1251   ALKPHOS 55 07/01/2013 0103   AST 28 03/20/2017 1251   AST 19 07/01/2013 0103   ALT 34 03/20/2017 1251   ALT 24 07/01/2013 0103   BILITOT 0.5 03/20/2017 1251   BILITOT 0.7 07/01/2013 0103       RADIOGRAPHIC STUDIES: I have personally reviewed the radiological images as listed and agreed with the findings in the report. No results found.   ASSESSMENT & PLAN:  Cancer of lower lobe of right lung ([place]) # RLL Lung ca; stage I no adjuvant therapy. CT Chest July 2018- NED. Clinically no evidence of recurrence. Will repeat in 6 months/jan 2019.   # Thyroid Ca s/p thyroidectomy- STAGE I /LOW risk - on Synthroid 75 mcg /day [goal 0.1 to 0.5 ]. TSH- 1.3; plan continue current dose sec to Poor tolerance. Continue to monitor  tumor markers.  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 Currently on Neurontin 100  mg at nighttime. Improved.  # osteopenia/ hypocalcemia- vit D+ ca BID; exercise.   # Thrombocytopenia- platelets 135 question ITP; repeat Normal at 153.   # Follow-up January 2019; CT scan chest prior; [facility] Thyroid profile/CBC CMP/thyroid tumor marker.    Orders Placed This Encounter  Procedures  . CT CHEST WO CONTRAST    Standing Status:   Future    Standing Expiration Date:   07/24/2018    Order Specific Question:   Preferred imaging location?    Answer:   [facility]    Order Specific Question:   Radiology Contrast Protocol - do NOT remove file path    Answer:   [email or web address]    Order Specific Question:   Reason for Exam additional comments    Answer:   lung cancer  . CBC with Differential/Platelet    Standing Status:   Future    Standing Expiration Date:   07/24/2018  . Comprehensive metabolic panel    Standing Status:   Future    Standing Expiration Date:   07/24/2018  . Thyroid Panel With TSH    Standing Status:   Future    Standing Expiration Date:   01/22/2019  . TgAb+Thyroglobulin IMA or RIA    Standing Status:   Future    Standing Expiration Date:   07/24/2018   All questions were answered. The patient knows to call the clinic with any problems, questions or concerns.      Cammie Sickle, MD 07/24/2017 9:43 PM

## 2017-07-24 NOTE — Assessment & Plan Note (Addendum)
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 CT Chest July 2018- NED. Clinically no evidence of recurrence. Will repeat in 6 months/jan 2019.   # Thyroid Ca s/p thyroidectomy- STAGE I /LOW risk - on Synthroid 75 mcg /day [goal 0.1 to 0.5 ]. TSH- 1.3; plan continue current dose sec to Poor tolerance. Continue to monitor tumor markers.  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 Currently on Neurontin 100 mg at nighttime. Improved.  # osteopenia/ hypocalcemia- vit D+ ca BID; exercise.   # Thrombocytopenia- platelets 135 question ITP; repeat Normal at 153.   # Follow-up January 2019; CT scan chest prior; [facility] Thyroid profile/CBC CMP/thyroid tumor marker.

## 2017-07-31 ENCOUNTER — Encounter: Payer: Self-pay | Admitting: Family Medicine

## 2017-08-22 IMAGING — CT NM PET TUM IMG INITIAL (PI) SKULL BASE T - THIGH
10 of 11 series · 18 of 25 positions shown · non-contrast
Comparison: 08/11/2015 chest CT.

CLINICAL DATA: Initial treatment strategy for right lower lobe
pulmonary nodule. No smoking history.

EXAM:
NUCLEAR MEDICINE PET SKULL BASE TO THIGH
TECHNIQUE: 12.3 mCi F-18 FDG was injected intravenously. Full-ring PET imaging
was performed from the skull base to thigh after the radiotracer. CT
data was obtained and used for attenuation correction and anatomic
localization.
FASTING BLOOD GLUCOSE:  Value: 82 mg/dl

[Series 3: ct wb 5.0 b30f · axial · 5.0mm · 0.98mm/px · z∈[-1334,-584]mm · 2 of 251 slices shown]
[im 1/251]
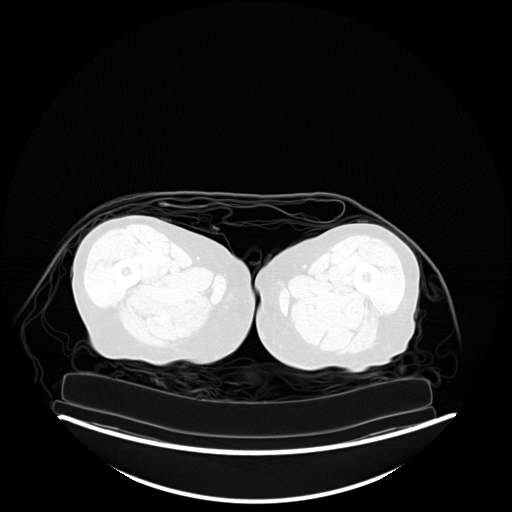
[im 251/251]
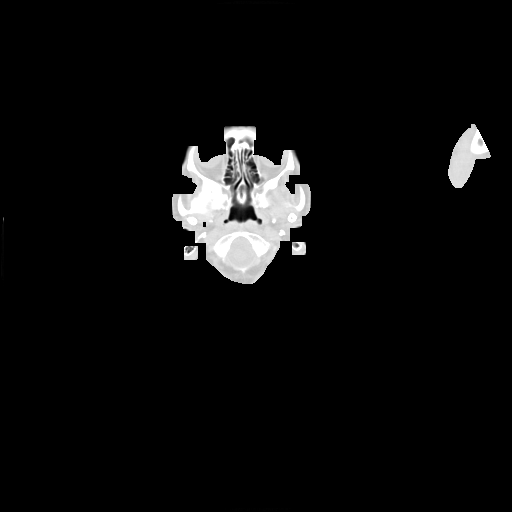

[Series 4: pet wb (ac) · axial · 5.0mm · 4.07mm/px · z∈[-1334,-960]mm · 2 of 251 slices shown]
[im 1/251]
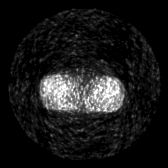
[im 126/251]
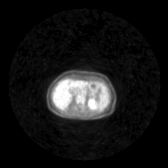

[Series 5: pet wb uncorrected (nac) · axial · 5.0mm · 4.07mm/px · z∈[-1334,-960]mm · 2 of 251 slices shown]
[im 1/251]
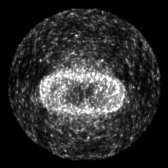
[im 126/251]
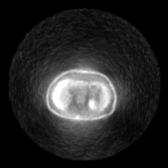

[Series 603: pet axial · 3 of 250 slices shown]
[im 1/250]
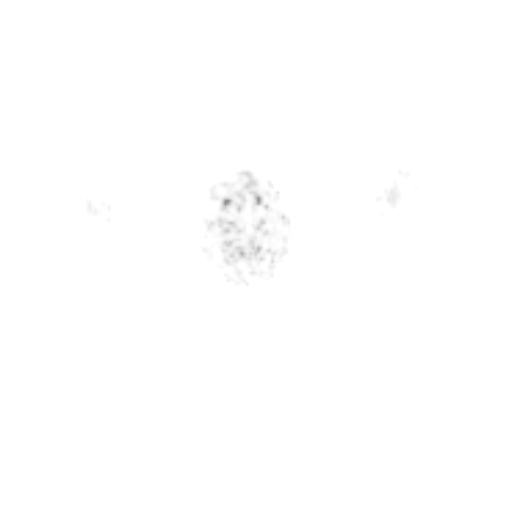
[im 84/250]
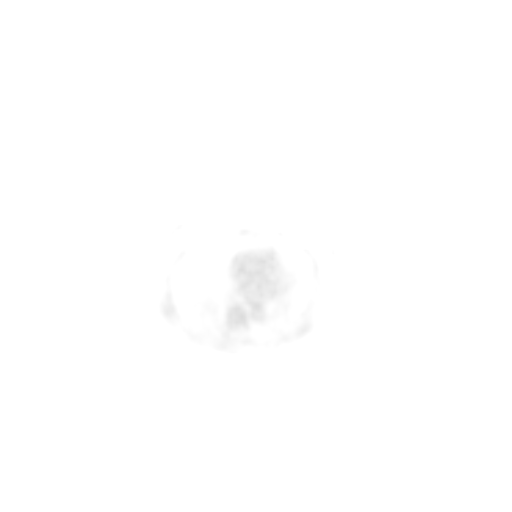
[im 167/250]
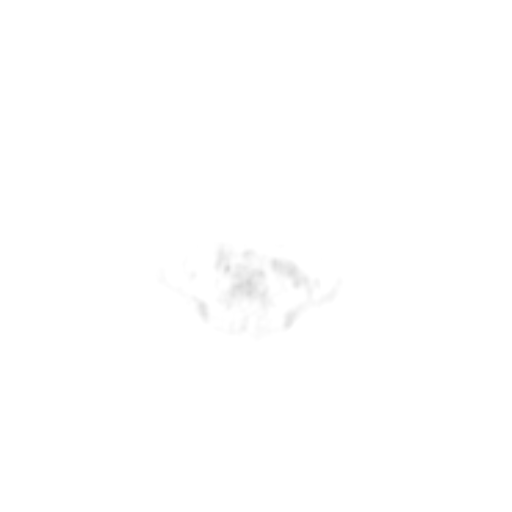

[Series 604: pet coronal · 1 of 90 slices shown]
[im 1/90]
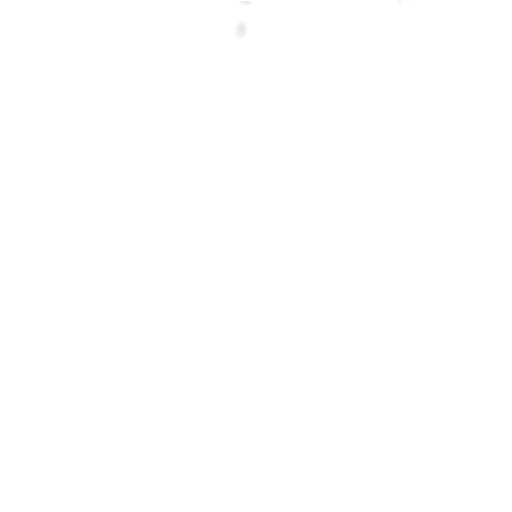

[Series 605: pet sagittal · 2 of 131 slices shown]
[im 1/131]
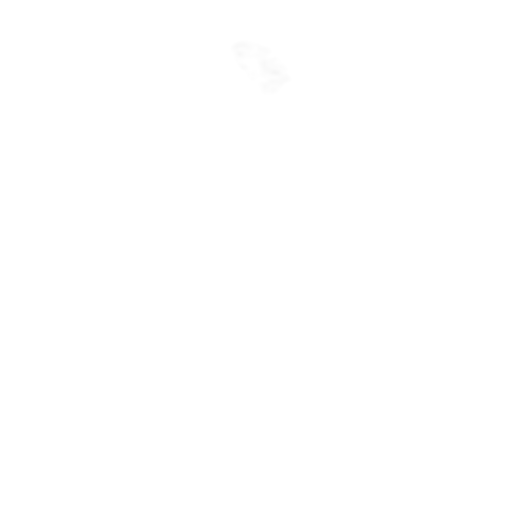
[im 131/131]
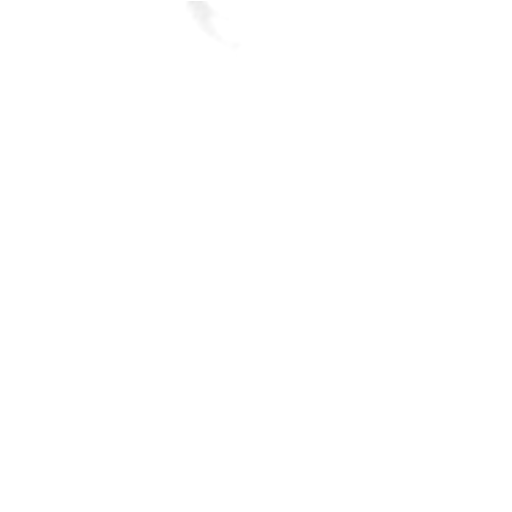

[Series 606: pet ctaxial · 2 of 251 slices shown]
[im 84/251]
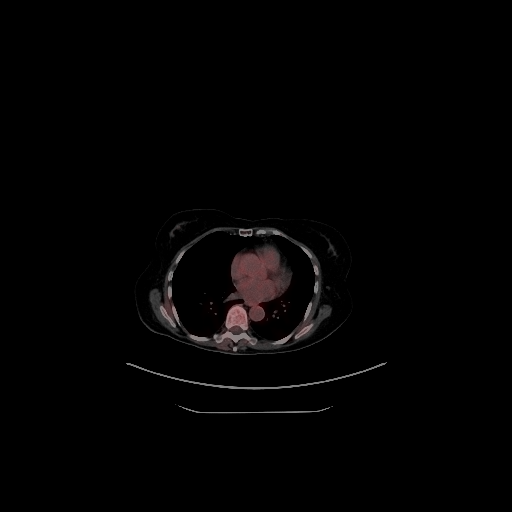
[im 167/251]
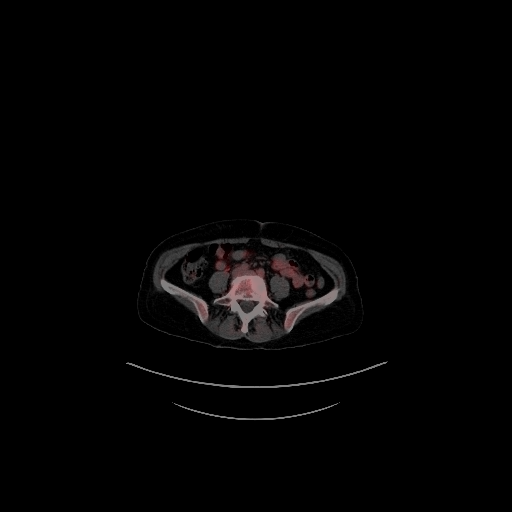

[Series 607: pet ct coronal · 1 of 89 slices shown]
[im 1/89]
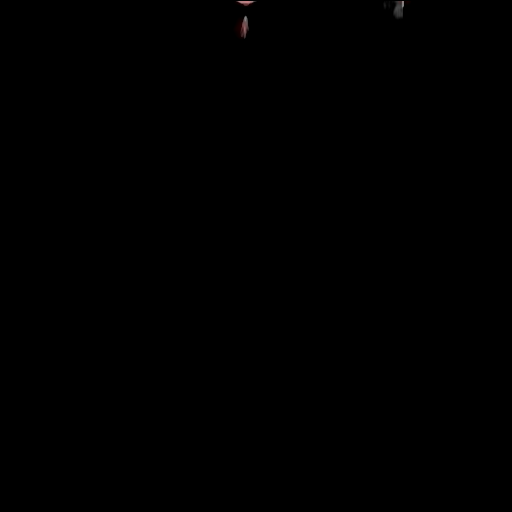

[Series 608: pet ct sagittal · 2 of 129 slices shown]
[im 1/129]
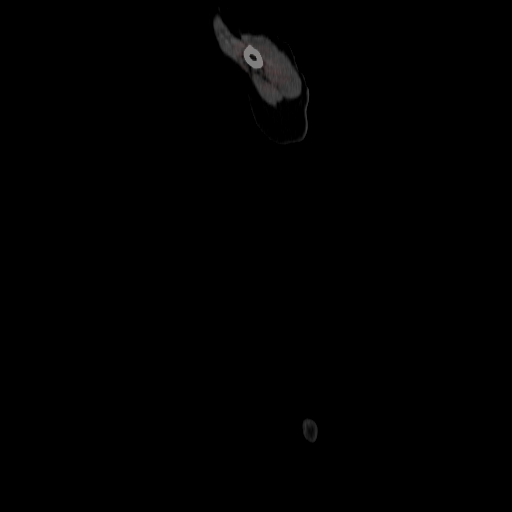
[im 129/129]
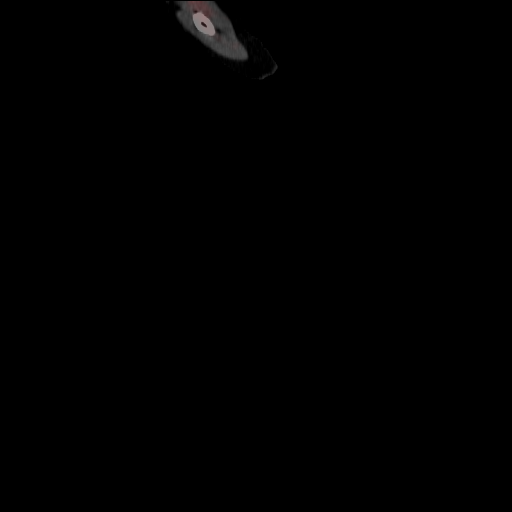

[Series 1116: results mm oncology reading · 5.0mm · 3.22mm/px · 1 of 3 slices shown]
[im 1/3]
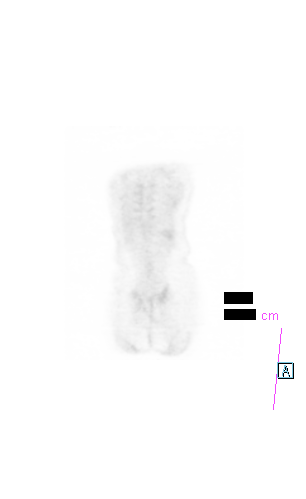

[18 of 25 positions shown; findings below may reference images not displayed]

FINDINGS: NECK

No hypermetabolic lymph nodes in the neck.

There is intensely hypermetabolic 1.7 cm hypodense anterior right
thyroid lobe nodule (series 3/image 41) with max SUV 14.9. There is
a non hypermetabolic 0.6 cm left thyroid lobe nodule.

CHEST

There is a 1.8 x 1.2 cm hypermetabolic pulmonary nodule in the
medial superior segment right lower lobe (series 3/image 66) with
max SUV 5.5.

There is a 0.4 cm right middle lobe pulmonary nodule (3/81),
unchanged since 04/05/2011 and without appreciable metabolism,
benign. No acute consolidative airspace disease or additional
significant pulmonary nodules.

There is a mildly hypermetabolic right hilar node with max SUV
(series 3/image 70), which is poorly delineated on this noncontrast
study.

There is a mildly hypermetabolic 1.1 cm lower right paratracheal
node (3/69) with max SUV 3.5. No hypermetabolic supraclavicular,
contralateral mediastinal or left hilar adenopathy. No
hypermetabolic axillary nodes.

ABDOMEN/PELVIS

No abnormal hypermetabolic activity within the liver, pancreas,
adrenal glands, or spleen. No hypermetabolic lymph nodes in the
abdomen or pelvis. A punctate 1 mm calcification in the interpolar
left kidney could represent a tiny nonobstructing stone. Status post
hysterectomy, with no abnormal findings of the vaginal cuff. Non
hypermetabolic subcutaneous 2.2 cm cystic structure in the lateral
posterior left gluteal soft tissues, likely a sebaceous cyst.

SKELETON

No focal hypermetabolic activity to suggest skeletal metastasis.
IMPRESSION: 1. Hypermetabolic superior segment right lower lobe 1.8 cm pulmonary
nodule, in keeping with a primary bronchogenic carcinoma.
2. Hypermetabolic right hilar and lower right paratracheal nodal
metastases. No hypermetabolic contralateral mediastinal,
contralateral hilar or supraclavicular adenopathy.
3. If the right lower lobe nodule proves to represent a non-small
cell lung carcinoma, the PET staging is T1a N2.
4. Intensely hypermetabolic 1.7 cm right thyroid lobe nodule.
Correlation with thyroid ultrasound and ultrasound-guided fine
needle aspiration is advised, as a significant proportion of
hypermetabolic thyroid nodules prove to be malignant.
5. No hypermetabolic distant metastatic disease.

## 2017-08-27 ENCOUNTER — Other Ambulatory Visit: Payer: Self-pay | Admitting: Family Medicine

## 2017-10-02 ENCOUNTER — Ambulatory Visit: Payer: PPO | Admitting: Family Medicine

## 2017-10-02 ENCOUNTER — Encounter: Payer: Self-pay | Admitting: Family Medicine

## 2017-10-02 VITALS — BP 128/76 | HR 86 | Temp 98.3°F | Wt 115.5 lb

## 2017-10-02 DIAGNOSIS — R109 Unspecified abdominal pain: Secondary | ICD-10-CM

## 2017-10-02 MED ORDER — CHOLESTYRAMINE 4 G PO PACK: 4.0000 g | PACK | Freq: Three times a day (TID) | ORAL | 1 refills | Status: DC

## 2017-10-02 NOTE — Patient Instructions (Signed)
Go to the lab on the way out.  We'll contact you with your lab report. Try taking cholestyramine daily with meals and see if that makes a difference in the diarrhea and the abdominal pain.  Update me in a few days if not better.  Take care.  Glad to see you.

## 2017-10-02 NOTE — Progress Notes (Signed)
Abdominal pain.  S/p cholecystectomy 10/2016.  09/05/17 noted lower abd pain.  She thought it was from something she ate.  She cut back to bland foods.  09/08/17 with diffuse abd pain, upper and lower abd pain.  Took tylenol and used a heating pad and it gradually got some better.  She stayed on a bland diet.  Crampy feeling thereafter.  She tried gas-X with some relief thereafter.    09/19/17 had diarrhea and some cramping.  Return of sx of Thanksgiving, but she didn't eat a heavy meal that day.  Again used tylenol and heating pad.    She tried mylanta in the meantime.   She had episodes of diarrhea and ended up using imodium.    She she has the abd pain, she'll also have pain between the shoulderblades on her upper back.    She doesn't feel sick except when she has the abd pain.  Normal BM today.  No fevers.  She can have occ burning in the chest, not exertional.    She typically doesn't eat much fatty food.  She is already taking omeprazole.    Meds, vitals, and allergies reviewed.   ROS: Per HPI unless specifically indicated in ROS section   GEN: nad, alert and oriented HEENT: mucous membranes moist NECK: supple w/o LA CV: rrr PULM: ctab, no inc wob ABD: soft, +bs EXT: no edema

## 2017-10-03 LAB — COMPREHENSIVE METABOLIC PANEL
ALT: 16 U/L (ref 0–35)
AST: 17 U/L (ref 0–37)
Albumin: 4.3 g/dL (ref 3.5–5.2)
Alkaline Phosphatase: 39 U/L (ref 39–117)
BUN: 12 mg/dL (ref 6–23)
CO2: 31 mEq/L (ref 19–32)
Calcium: 9.3 mg/dL (ref 8.4–10.5)
Chloride: 103 mEq/L (ref 96–112)
Creatinine, Ser: 0.66 mg/dL (ref 0.40–1.20)
GFR: 93.21 mL/min (ref >60.00)
Glucose, Bld: 99 mg/dL (ref 70–99)
Potassium: 4 mEq/L (ref 3.5–5.1)
Sodium: 139 mEq/L (ref 135–145)
Total Bilirubin: 0.4 mg/dL (ref 0.2–1.2)
Total Protein: 6.5 g/dL (ref 6.0–8.3)

## 2017-10-03 LAB — CBC WITH DIFFERENTIAL/PLATELET
Basophils Absolute: 0.1 10*3/uL (ref 0.0–0.1)
Basophils Relative: 1.4 % (ref 0.0–3.0)
Eosinophils Absolute: 0.1 10*3/uL (ref 0.0–0.7)
Eosinophils Relative: 1.2 % (ref 0.0–5.0)
HCT: 39.6 % (ref 36.0–46.0)
Hemoglobin: 13.3 g/dL (ref 12.0–15.0)
Lymphocytes Relative: 35.5 % (ref 12.0–46.0)
Lymphs Abs: 2.2 10*3/uL (ref 0.7–4.0)
MCHC: 33.5 g/dL (ref 30.0–36.0)
MCV: 92.3 fl (ref 78.0–100.0)
Monocytes Absolute: 0.4 10*3/uL (ref 0.1–1.0)
Monocytes Relative: 6.2 % (ref 3.0–12.0)
Neutro Abs: 3.4 10*3/uL (ref 1.4–7.7)
Neutrophils Relative %: 55.7 % (ref 43.0–77.0)
Platelets: 133 10*3/uL — ABNORMAL LOW (ref 150.0–400.0)
RBC: 4.29 Mil/uL (ref 3.87–5.11)
RDW: 14.6 % (ref 11.5–15.5)
WBC: 6.1 10*3/uL (ref 4.0–10.5)

## 2017-10-03 LAB — LIPASE: Lipase: 35 U/L (ref 11.0–59.0)

## 2017-10-04 NOTE — Assessment & Plan Note (Signed)
Likely post cholecystectomy sx, d/w pt.  Benign exam.  Check labs today, will try taking cholestyramine daily with meals and see if that makes a difference in the diarrhea and the abdominal pain.  Update me in a few days if not better. She agrees.  Okay for outpatient f/u.  >25 minutes spent in face to face time with patient, >50% spent in counselling or coordination of care.

## 2017-10-16 ENCOUNTER — Other Ambulatory Visit: Payer: Self-pay | Admitting: *Deleted

## 2017-10-16 MED ORDER — CHOLESTYRAMINE 4 G PO PACK: 4.0000 g | PACK | Freq: Three times a day (TID) | ORAL | 1 refills | Status: DC

## 2017-10-27 DIAGNOSIS — H2513 Age-related nuclear cataract, bilateral: Secondary | ICD-10-CM | POA: Diagnosis not present

## 2017-10-27 DIAGNOSIS — H02839 Dermatochalasis of unspecified eye, unspecified eyelid: Secondary | ICD-10-CM | POA: Diagnosis not present

## 2017-10-27 DIAGNOSIS — H18413 Arcus senilis, bilateral: Secondary | ICD-10-CM | POA: Diagnosis not present

## 2017-11-04 IMAGING — CR DG CHEST 1V PORT
1 series · 1 of 1 positions shown · non-contrast
Comparison: 10/20/2015 and earlier including PET-CT 08/14/2015.

CLINICAL DATA: Immediately postop right lower lobectomy for lung
cancer.

EXAM:
PORTABLE CHEST 1 VIEW

[ap]
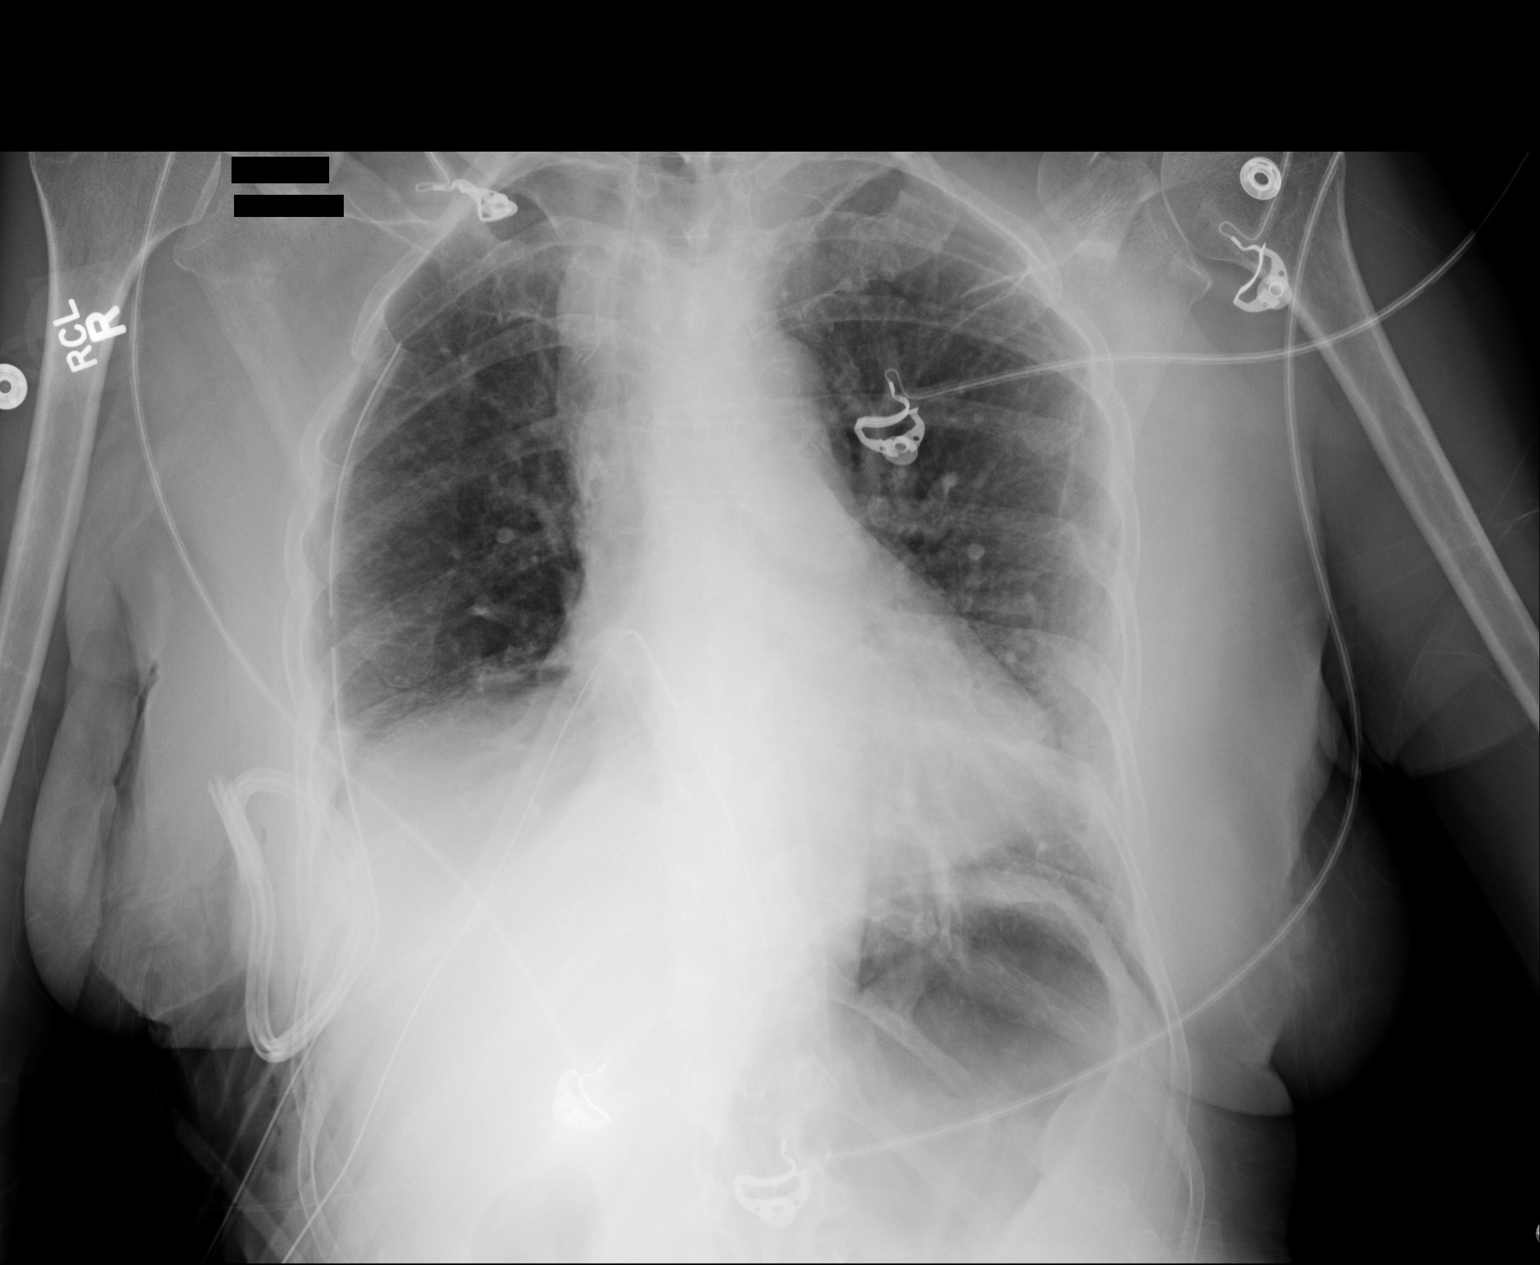

[1 of 1 positions shown; findings below may reference images not displayed]

FINDINGS: Two right chest tubes in place with no evidence of pneumothorax.
Post surgical changes related to right lower lobectomy. Mild
atelectasis in the base of the remaining right lung. Mild left
basilar atelectasis. Lungs otherwise clear.
IMPRESSION: 1. No pneumothorax with 2 right chest tubes in place.
2. Mild bibasilar atelectasis. No acute cardiopulmonary disease
otherwise.

## 2017-11-09 ENCOUNTER — Telehealth: Payer: Self-pay | Admitting: *Deleted

## 2017-11-09 NOTE — Telephone Encounter (Signed)
Patient having lab appointment 1/25 and see doctor 1/30, asking if she can have her labs drawn at [facility] where she does not have to pay $100 co pay, will need orders. She states she can pick up order from [place]. Please advise

## 2017-11-09 NOTE — Telephone Encounter (Signed)
Patient informed that order is ready to pick up

## 2017-11-17 DIAGNOSIS — C73 Malignant neoplasm of thyroid gland: Secondary | ICD-10-CM | POA: Diagnosis not present

## 2017-11-17 DIAGNOSIS — C3431 Malignant neoplasm of lower lobe, right bronchus or lung: Secondary | ICD-10-CM | POA: Diagnosis not present

## 2017-11-22 DIAGNOSIS — H2511 Age-related nuclear cataract, right eye: Secondary | ICD-10-CM | POA: Diagnosis not present

## 2017-11-22 DIAGNOSIS — H2513 Age-related nuclear cataract, bilateral: Secondary | ICD-10-CM | POA: Diagnosis not present

## 2017-11-24 ENCOUNTER — Ambulatory Visit
Admission: RE | Admit: 2017-11-24 | Discharge: 2017-11-24 | Disposition: A | Discharge status: Home / Self Care | Payer: PPO | Source: Ambulatory Visit | Attending: Internal Medicine | Admitting: Internal Medicine

## 2017-11-24 ENCOUNTER — Other Ambulatory Visit: Payer: PPO

## 2017-11-24 DIAGNOSIS — I700 Atherosclerosis of aorta: Secondary | ICD-10-CM | POA: Diagnosis not present

## 2017-11-24 DIAGNOSIS — C73 Malignant neoplasm of thyroid gland: Secondary | ICD-10-CM | POA: Diagnosis not present

## 2017-11-24 DIAGNOSIS — C3431 Malignant neoplasm of lower lobe, right bronchus or lung: Secondary | ICD-10-CM

## 2017-11-29 ENCOUNTER — Inpatient Hospital Stay: Payer: PPO | Attending: Internal Medicine | Admitting: Internal Medicine

## 2017-11-29 VITALS — BP 111/65 | HR 83 | Temp 97.8°F | Wt 116.8 lb

## 2017-11-29 DIAGNOSIS — M8580 Other specified disorders of bone density and structure, unspecified site: Secondary | ICD-10-CM | POA: Insufficient documentation

## 2017-11-29 DIAGNOSIS — K219 Gastro-esophageal reflux disease without esophagitis: Secondary | ICD-10-CM | POA: Diagnosis not present

## 2017-11-29 DIAGNOSIS — E890 Postprocedural hypothyroidism: Secondary | ICD-10-CM | POA: Diagnosis not present

## 2017-11-29 DIAGNOSIS — I700 Atherosclerosis of aorta: Secondary | ICD-10-CM | POA: Insufficient documentation

## 2017-11-29 DIAGNOSIS — E785 Hyperlipidemia, unspecified: Secondary | ICD-10-CM | POA: Insufficient documentation

## 2017-11-29 DIAGNOSIS — R0789 Other chest pain: Secondary | ICD-10-CM

## 2017-11-29 DIAGNOSIS — C73 Malignant neoplasm of thyroid gland: Secondary | ICD-10-CM | POA: Diagnosis not present

## 2017-11-29 DIAGNOSIS — Z79899 Other long term (current) drug therapy: Secondary | ICD-10-CM | POA: Diagnosis not present

## 2017-11-29 DIAGNOSIS — I10 Essential (primary) hypertension: Secondary | ICD-10-CM | POA: Insufficient documentation

## 2017-11-29 DIAGNOSIS — Z85118 Personal history of other malignant neoplasm of bronchus and lung: Secondary | ICD-10-CM | POA: Diagnosis not present

## 2017-11-29 NOTE — Progress Notes (Signed)
[facility] OFFICE PROGRESS NOTE  Patient Care Team: Tonia Ghent, MD as PCP - General (Family Medicine) Beverly Gust, MD as Consulting Physician (Otolaryngology) Nestor Lewandowsky, MD as Consulting Physician (Cardiothoracic Surgery) Cammie Sickle, MD as Consulting Physician (Internal Medicine) Birder Robson, MD as Consulting Physician (Ophthalmology) Clayburn Pert, MD as Consulting Physician (General Surgery)  Cancer Staging Thyroid cancer [facility]) Staging form: Thyroid - Papillary or Follicular (Under 45 years), AJCC 7th Edition - Clinical: Stage I (T1, N0, M0) - Signed by Forest Gleason, MD on 09/11/2015    Oncology History   # NOV 2016- RLL [place] ca T1N0 [incidental; Dr.Simonds; Bronc- s/p RLlobectomy; Dr.Oaks;Dec 2016]  # FEB 2017- 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 STAGE I; s/p Total Thyroidetcomy [Dr.Chapman];NO RAIU.  LOW RISK; but detectable thyroglobulin- on Synthroid     Thyroid cancer (HCC)    Cancer of lower lobe of right lung ([place])   05/06/2016 Initial Diagnosis    Malignant neoplasm of lower lobe, right bronchus or lung ([place])       INTERVAL HISTORY:  [name] 72 y.o.  female pleasant patient above history of stage I lung cancer status post lobectomy; m and also history of thyroid cancer status post thyroidectomy currently on Synthroid is here for follow-up./Reviewed the results of the CT scan.  Patient complains of chronic abdominal discomfort since her gallbladder surgery. Patient complains of less fatigue. Denies having any palpitations since cutting down on the dose of the Synthroid. Patient is currently on Synthroid 75 g a day.  Denies any chest pain. Denies any shortness of breath or cough or swelling in the legs.   REVIEW OF SYSTEMS:  A complete 10 point review of system is done which is negative except mentioned above/history of present illness.    PAST MEDICAL HISTORY :  Past Medical History:  Diagnosis Date  . Cancer of lung ([place]) 10/01/2015  . GERD (gastroesophageal reflux disease)   . Groin pain    Along superior/laterl R inguinal canal.  Could be due to hernia or old scar tissue.  prev with CT done w/o alarming findings.  Will treat episodically.  Use tylenol #3 prn.    . Hyperlipidemia   . Hypertension   . Lipoma of thigh    Left  . Normal cardiac stress test 2014  . PONV (postoperative nausea and vomiting)    nausea after hysterectomy, and 2 days after lung surgery 10/2015  . Thyroid cancer ([place])   . Thyroid nodule     PAST SURGICAL HISTORY :   Past Surgical History:  Procedure Laterality Date  . ABDOMINAL HYSTERECTOMY  12-30-08  . APPENDECTOMY  11/23/2016   Dr. Adonis Huguenin  . BREAST EXCISIONAL BIOPSY Right 1977  . BREAST MASS EXCISION  1977   benign  . CARPAL TUNNEL RELEASE Right 1978  . CHOLECYSTECTOMY  11/23/2016   Procedure: LAPAROSCOPIC CHOLECYSTECTOMY;  Surgeon: Clayburn Pert, MD;  Location: [facility] ORS;  Service: General;;  . COLONOSCOPY  2009  . DILATION AND CURETTAGE OF UTERUS  2001  . ELECTROMAGNETIC NAVIGATION BROCHOSCOPY N/A 08/25/2015   Procedure: ELECTROMAGNETIC NAVIGATION BRONCHOSCOPY;  Surgeon: Flora Lipps, MD;  Location: [facility] ORS;  Service: Cardiopulmonary;  Laterality: N/A;  . ENDOBRONCHIAL ULTRASOUND N/A 08/25/2015   Procedure: ENDOBRONCHIAL ULTRASOUND;  Surgeon: Flora Lipps, MD;  Location: [facility] ORS;  Service: Cardiopulmonary;  Laterality: N/A;  . ESOPHAGOGASTRODUODENOSCOPY  2002, 11/15/07   Normal (Dr. Allyn Kenner)  . FACIAL COSMETIC SURGERY  01/13/10   mini facelift  . HERNIA REPAIR  1976  . THORACOTOMY/LOBECTOMY Right 10/27/2015   Procedure: THORACOTOMY/LOBECTOMY;  Surgeon: Nestor Lewandowsky, MD;  Location: [facility] ORS;  Service: Thoracic;  Laterality: Right;  . THYROIDECTOMY N/A 12/29/2015   Procedure: THYROIDECTOMY;  Surgeon: Beverly Gust, MD;  Location: [facility] ORS;  Service: ENT;  Laterality: N/A;  .  TOTAL VAGINAL HYSTERECTOMY  12/30/08   for fibroid pain (Dr. Gertie Fey)    FAMILY HISTORY :   Family History  Problem Relation Age of Onset  . Transient ischemic attack Mother   . Hypertension Mother   . Stroke Mother        mini strokes  . Hypertension Sister   . Cancer Sister        ovarian  . Hypertension Sister   . Hypertension Sister   . Hypertension Sister   . Breast cancer Neg Hx   . Colon cancer Neg Hx     SOCIAL HISTORY:   Social History   Tobacco Use  . Smoking status: Never Smoker  . Smokeless tobacco: Never Used  Substance Use Topics  . Alcohol use: No    Alcohol/week: 0.0 oz  . Drug use: No    ALLERGIES:  is allergic to atorvastatin; ciprofloxacin; epinephrine; metoprolol succinate; simvastatin; and sulfonamide derivatives.  MEDICATIONS:  Current Outpatient Medications  Medication Sig Dispense Refill  . Ascorbic Acid (VITAMIN C) 500 MG tablet Take 2,000 mg by mouth daily. Reported on 02/04/2016    . Biotin (PA BIOTIN) 1000 MCG tablet Take 1,000 mcg by mouth daily. Reported on 02/04/2016    . Cholecalciferol (VITAMIN D-3) 1000 units CAPS Take 1 capsule by mouth daily with supper.     . cholestyramine (QUESTRAN) 4 g packet Take 1 packet (4 g total) by mouth 3 (three) times daily with meals. 60 each 1  . Coenzyme Q10 (COQ10) 200 MG CAPS Take 200 mg by mouth daily with supper.    . Folic Acid-Vit S0-YTK Z60 (FA-VITAMIN B-6-VITAMIN B-12 PO) Take 1 tablet by mouth daily after breakfast. Reported on 02/04/2016    . gabapentin (NEURONTIN) 100 MG capsule Take 100 mg by mouth at bedtime.    Marland Kitchen levothyroxine (SYNTHROID, LEVOTHROID) 75 MCG tablet Take 1 tablet (75 mcg total) by mouth daily before breakfast. 90 tablet 6  . Lutein 20 MG CAPS Take 20 mg by mouth daily.    . Omega-3 Fatty Acids (SUPER OMEGA 3 EPA/DHA) 1000 MG CAPS Take 1,000 mg by mouth daily with supper.    Marland Kitchen omeprazole (PRILOSEC) 40 MG capsule Take 1 capsule (40 mg total) by mouth daily. 90 capsule 3  . Potassium  Gluconate 595 MG CAPS Take 595 mg by mouth daily.     . pravastatin (PRAVACHOL) 40 MG tablet TAKE 1 TABLET (40 MG TOTAL) BY MOUTH DAILY. 90 tablet 3  . OVER THE COUNTER MEDICATION Take 1 tablet by mouth daily with supper. calcium 500 mg and K 40 mcg     No current facility-administered medications for this visit.     PHYSICAL EXAMINATION: ECOG PERFORMANCE STATUS: 0 - Asymptomatic  BP 111/65 (BP Location: Left Arm, Patient Position: Sitting)   Pulse 83   Temp 97.8 F (36.6 C)   Wt 116 lb 12.8 oz (53 kg)   SpO2 95%   BMI 20.05 kg/m   Filed Weights   11/29/17 1424  Weight: 116 lb 12.8 oz (53 kg)    GENERAL: Well-nourished well-developed; Alert, no distress and comfortable.   Accompanied by  husband. EYES: no pallor or icterus OROPHARYNX: no thrush or ulceration; good dentition  NECK: supple, no masses felt LYMPH:  no palpable lymphadenopathy in the cervical, axillary or inguinal regions LUNGS: clear to auscultation and  No wheeze or crackles HEART/CVS: regular rate & rhythm and no murmurs; No lower extremity edema ABDOMEN:abdomen soft, non-tender and normal bowel sounds Musculoskeletal:no cyanosis of digits and no clubbing  PSYCH: alert & oriented x 3 with fluent speech NEURO: no focal motor/sensory deficits SKIN:  no rashes or significant lesions ----------------------------------------------------------------------------------------- ------------------  IMPRESSION: Stable exam. No evidence of recurrent or metastatic carcinoma within the thorax.  Aortic Atherosclerosis (ICD10-I70.0).   Electronically Signed   By: Earle Gell M.D.   On: 11/24/2017 12:33   LABORATORY DATA:  I have reviewed the data as listed    Component Value Date/Time   NA 139 10/02/2017 1627   NA 141 07/01/2013 0103   K 4.0 10/02/2017 1627   K 3.8 07/01/2013 0103   CL 103 10/02/2017 1627   CL 110 (H) 07/01/2013 0103   CO2 31 10/02/2017 1627   CO2 27 07/01/2013 0103   GLUCOSE 99  10/02/2017 1627   GLUCOSE 88 07/01/2013 0103   BUN 12 10/02/2017 1627   BUN 11 07/01/2013 0103   CREATININE 0.66 10/02/2017 1627   CREATININE 0.68 07/01/2013 0103   CALCIUM 9.3 10/02/2017 1627   CALCIUM 8.2 (L) 07/01/2013 0103   PROT 6.5 10/02/2017 1627   PROT 5.5 (L) 07/01/2013 0103   ALBUMIN 4.3 10/02/2017 1627   ALBUMIN 3.0 (L) 07/01/2013 0103   AST 17 10/02/2017 1627   AST 19 07/01/2013 0103   ALT 16 10/02/2017 1627   ALT 24 07/01/2013 0103   ALKPHOS 39 10/02/2017 1627   ALKPHOS 55 07/01/2013 0103   BILITOT 0.4 10/02/2017 1627   BILITOT 0.7 07/01/2013 0103   GFRNONAA >60 03/20/2017 1251   GFRNONAA >60 07/01/2013 0103   GFRAA >60 03/20/2017 1251   GFRAA >60 07/01/2013 0103    No results found for: SPEP, UPEP  Lab Results  Component Value Date   WBC 6.1 10/02/2017   NEUTROABS 3.4 10/02/2017   HGB 13.3 10/02/2017   HCT 39.6 10/02/2017   MCV 92.3 10/02/2017   PLT 133.0 (L) 10/02/2017      Chemistry      Component Value Date/Time   NA 139 10/02/2017 1627   NA 141 07/01/2013 0103   K 4.0 10/02/2017 1627   K 3.8 07/01/2013 0103   CL 103 10/02/2017 1627   CL 110 (H) 07/01/2013 0103   CO2 31 10/02/2017 1627   CO2 27 07/01/2013 0103   BUN 12 10/02/2017 1627   BUN 11 07/01/2013 0103   CREATININE 0.66 10/02/2017 1627   CREATININE 0.68 07/01/2013 0103      Component Value Date/Time   CALCIUM 9.3 10/02/2017 1627   CALCIUM 8.2 (L) 07/01/2013 0103   ALKPHOS 39 10/02/2017 1627   ALKPHOS 55 07/01/2013 0103   AST 17 10/02/2017 1627   AST 19 07/01/2013 0103   ALT 16 10/02/2017 1627   ALT 24 07/01/2013 0103   BILITOT 0.4 10/02/2017 1627   BILITOT 0.7 07/01/2013 0103     IMPRESSION: Stable exam. No evidence of recurrent or metastatic carcinoma within the thorax.  Aortic Atherosclerosis (ICD10-I70.0).   Electronically Signed   By: Earle Gell M.D.   On: 11/24/2017 12:33   RADIOGRAPHIC STUDIES: I have personally reviewed the radiological images as  listed and agreed with the findings in  the report. No results found.   ASSESSMENT & PLAN:  Thyroid cancer ([place]) # RLL Lung ca; stage I no adjuvant therapy. CT Chest Jan 2019- NED. Clinically no evidence of recurrence.  # Thyroid Ca s/p thyroidectomy- STAGE I /LOW risk - on Synthroid 75 mcg /day [goal 0.1 to 0.5 ]. TSH- 1.3; plan continue current dose sec to Poor tolerance to higher doses.  Continue to monitor tumor markers.  # abdominal bloating status post gallbladder surgery.  Currently on colestipol.  Improved.  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 Currently on Neurontin 100 mg at nighttime. Improved.  # osteopenia/ hypocalcemia- vit D+ ca BID; exercise.   # follow up 6 months/ [labs- [facility]- 1week prior.]    No orders of the defined types were placed in this encounter.  All questions were answered. The patient knows to call the clinic with any problems, questions or concerns.      Cammie Sickle, MD 12/03/2017 8:15 PM

## 2017-11-29 NOTE — Assessment & Plan Note (Addendum)
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 CT Chest Jan 2019- NED. Clinically no evidence of recurrence.  # Thyroid Ca s/p thyroidectomy- STAGE I /LOW risk - on Synthroid 75 mcg /day [goal 0.1 to 0.5 ]. TSH- 1.3; plan continue current dose sec to Poor tolerance to higher doses.  Tumor marker/thyroglobulin norma//detectable; positive for thyroglobulin antibodies.  Continue to monitor tumor markers.  # abdominal bloating status post gallbladder surgery.  Currently on colestipol.  Improved.  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 Currently on Neurontin 100 mg at nighttime. Improved.  # osteopenia/ hypocalcemia- vit D+ ca BID; exercise.   # follow up 6 months/ [labs- [facility]- 1week prior.]

## 2017-12-18 DIAGNOSIS — H52201 Unspecified astigmatism, right eye: Secondary | ICD-10-CM | POA: Diagnosis not present

## 2017-12-18 DIAGNOSIS — H2511 Age-related nuclear cataract, right eye: Secondary | ICD-10-CM | POA: Diagnosis not present

## 2017-12-19 DIAGNOSIS — H2512 Age-related nuclear cataract, left eye: Secondary | ICD-10-CM | POA: Diagnosis not present

## 2017-12-25 DIAGNOSIS — H2511 Age-related nuclear cataract, right eye: Secondary | ICD-10-CM | POA: Diagnosis not present

## 2018-01-08 DIAGNOSIS — H52202 Unspecified astigmatism, left eye: Secondary | ICD-10-CM | POA: Diagnosis not present

## 2018-01-08 DIAGNOSIS — H2512 Age-related nuclear cataract, left eye: Secondary | ICD-10-CM | POA: Diagnosis not present

## 2018-03-29 ENCOUNTER — Encounter: Payer: Self-pay | Admitting: Internal Medicine

## 2018-04-13 ENCOUNTER — Encounter: Payer: Self-pay | Admitting: Family Medicine

## 2018-04-13 ENCOUNTER — Ambulatory Visit (INDEPENDENT_AMBULATORY_CARE_PROVIDER_SITE_OTHER): Payer: PPO | Admitting: Family Medicine

## 2018-04-13 ENCOUNTER — Ambulatory Visit (INDEPENDENT_AMBULATORY_CARE_PROVIDER_SITE_OTHER)
Admission: RE | Admit: 2018-04-13 | Discharge: 2018-04-13 | Disposition: A | Discharge status: Home / Self Care | Payer: PPO | Source: Ambulatory Visit | Attending: Family Medicine | Admitting: Family Medicine

## 2018-04-13 VITALS — BP 92/52 | HR 78 | Temp 98.4°F | Ht 64.0 in | Wt 121.5 lb

## 2018-04-13 DIAGNOSIS — M549 Dorsalgia, unspecified: Secondary | ICD-10-CM | POA: Diagnosis not present

## 2018-04-13 DIAGNOSIS — R079 Chest pain, unspecified: Secondary | ICD-10-CM | POA: Diagnosis not present

## 2018-04-13 NOTE — Progress Notes (Signed)
She isn't lightheaded on standing.  She isn't on BP medicines.    She had B cataract surgery.   She had been on cholestyramine but stopped that when she had a tooth pulled.  She has been able to tolerate stopping the medicine in the meantime.    Med list updated.   She has a B upper back pain near the B shoulder blades for 4-5 weeks  Can radiate up the neck.  She has a popping in the neck with ROM in the neck.  Heat helps.  No fevers.  No cough.  Some lifting with household work.    She has occ hot flashes.  She has f/u pending with oncology about her thyroid.    Her friend died last night- the friend recently found out about a dx of bone cancer.   Patient's son is undergoing evaluation and treatment for cirrhosis of the liver.  PMH and SH reviewed  ROS: Per HPI unless specifically indicated in ROS section   Meds, vitals, and allergies reviewed.   GEN: nad, alert and oriented HEENT: mucous membranes moist NECK: supple w/o LA CV: rrr.  PULM: ctab, no inc wob.   No rash on the upper back but she does have some tenderness of the paraspinal muscles in the bilateral upper back.  No bruising.   ABD: soft, +bs EXT: no edema SKIN: no acute rash

## 2018-04-13 NOTE — Patient Instructions (Signed)
Stop the pravastatin for about 1 week and see if that helps.  Use heat and tylenol in the meantime.  Go to the lab on the way out.  We'll contact you with your xray report. Take care.  Glad to see you.

## 2018-04-14 DIAGNOSIS — M549 Dorsalgia, unspecified: Secondary | ICD-10-CM | POA: Insufficient documentation

## 2018-04-14 NOTE — Assessment & Plan Note (Signed)
I think this is likely a muscular issue.  Heat seems to help some.  Reasonable to use that in the meantime.  Unclear if she has muscle aches that are statin related.  Stop statin for now.  Reasonable to check a chest x-ray given the distribution of her pain.  Discussed with patient, especially given her history.  See notes on imaging.  I do not suspect an ominous process.  Okay for outpatient follow-up.  Rationale for treatment discussed with patient and she agreed.

## 2018-04-20 ENCOUNTER — Telehealth: Payer: Self-pay

## 2018-04-20 NOTE — Telephone Encounter (Signed)
Pt last seen 04/13/18 when pravastatin was stopped for 1 wk.Please advise.

## 2018-04-20 NOTE — Telephone Encounter (Signed)
Patient advised.

## 2018-04-20 NOTE — Telephone Encounter (Signed)
Copied from [place] [phone]. Topic: General - Other >> Apr 20, 2018  9:58 AM Yvette Rack wrote: Reason for CRM: Pt called to advise of how she is doing. Pt states the pain is not as bad and she would like to stay off the medication for an additional week to see if there is any improvement. Cb# [phone]

## 2018-04-20 NOTE — Telephone Encounter (Signed)
Agree, have her please update [place] in about another week.  Thanks.

## 2018-05-02 ENCOUNTER — Ambulatory Visit (INDEPENDENT_AMBULATORY_CARE_PROVIDER_SITE_OTHER): Payer: PPO | Admitting: Family Medicine

## 2018-05-02 ENCOUNTER — Encounter: Payer: Self-pay | Admitting: Family Medicine

## 2018-05-02 DIAGNOSIS — M549 Dorsalgia, unspecified: Secondary | ICD-10-CM | POA: Diagnosis not present

## 2018-05-02 NOTE — Patient Instructions (Signed)
It is still likely that is a benign chest wall issue but I'll talk with Dr. B in the meantime.   The statin may have made it worse.   Stay off the statin for now.  Take care.  Glad to see you.

## 2018-05-02 NOTE — Progress Notes (Signed)
Her son went to [facility] re: liver testing.  He is awaiting updates from [facility].  D/w pt.    She stopped statin and the back pain got some better but never fully resolved. She still has some discomfort on the L side of her back.  She has some occ frontal pain but not exertional.     She admits (reasonably) that she gets worried about an unexplained pain given her cancer hx.    Prev CXR was stable.  She had a cold last week but that resolved. No sputum o/w.  No hemoptysis.  Her weight is stable.    She has occ hot flashes that she attributed to her thyroid replacement, this is at baseline.   No rash.  No trauma.     Meds, vitals, and allergies reviewed.   ROS: Per HPI unless specifically indicated in ROS section   GEN: nad, alert and oriented HEENT: mucous membranes moist NECK: supple w/o LA CV: rrr PULM: ctab, no inc wob ABD: soft, +bs EXT: no edema SKIN: no acute rash L upper back slightly ttp after testing at exam but not rash locally.  No midline back pain.

## 2018-05-04 ENCOUNTER — Telehealth: Payer: Self-pay | Admitting: Family Medicine

## 2018-05-04 NOTE — Assessment & Plan Note (Signed)
She admits (reasonably) that she gets worried about an unexplained pain given her cancer hx.    Prev CXR was stable.    She improved some but not fully when she stopped her statin.  She does have a history of multiple statin intolerances.  It is still likely that is a benign chest wall issue (ie muscle source) but I'll talk with Dr. Rogue Bussing in the meantime, for input. See following phone note.  Patient agrees.  See avs.

## 2018-05-04 NOTE — Telephone Encounter (Signed)
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  I stopped her statin and he got some better but it did not totally resolve.  Chest x-ray was unremarkable.  No rash.  I think this is likely a chest wall issue.  I suspect it is a benign process, however I wanted your input.  I did not want to put her through a chest CT unnecessarily.  The patient and I agreed that it was reasonable to get your input to see if you had any guidance.  I greatly appreciate your input, as does the patient.

## 2018-05-09 NOTE — Telephone Encounter (Signed)
Notify pt.  I thank all involved. See below.   If better, then hold off on CT for now.  If pt could wait for few more weeks then Dr. Rogue Bussing can see her in end of July and reassess her symptoms.  If worse, then I'll order the CT.  Let me know.  Thanks.

## 2018-05-09 NOTE — Telephone Encounter (Signed)
Patient notified as instructed by telephone and verbalized understanding. Patient stated that she is not any worse and will hold off on a CT for now.  Patient stated that she will call back if she gets worse before her appointment the end of the month with Dr. Rogue Bussing.

## 2018-05-09 NOTE — Telephone Encounter (Signed)
Noted. Thanks.

## 2018-05-09 NOTE — Telephone Encounter (Signed)
I was unable to speak with anyone at Dr Aletha Halim office so I am not sure if Dr Rogue Bussing is in  Office. FYI to Dr Rogue Bussing and Dr Damita Dunnings.

## 2018-05-09 NOTE — Telephone Encounter (Signed)
Sorry, Dr.Duncan- not to have responded to your earlier message.   I looked back at pt's history- she has stage I lung ca- so unlikely to have recurrence of maligancy- to cause her pain. If her pain is improved; I agree with holding the CT scan. And if pt could wait for few more week;I see her in end of July- and re-assess her symptoms.   But, if symptoms are not better/worse- I would recommend CT scan.   Please reach me at [phone] if any questions.  Regards, GB

## 2018-05-09 NOTE — Telephone Encounter (Signed)
Please try to get in touch with anyone at Dr. Aletha Halim office to see who is covering and when we can expect input on this. I thank all involved.   [name]

## 2018-05-09 NOTE — Telephone Encounter (Signed)
PT. Called about if Dr. Damita Dunnings had heard back from Dr. Rogue Bussing and what she may need to do.   Please call pt.

## 2018-05-17 ENCOUNTER — Other Ambulatory Visit: Payer: Self-pay | Admitting: Family Medicine

## 2018-05-22 DIAGNOSIS — C3431 Malignant neoplasm of lower lobe, right bronchus or lung: Secondary | ICD-10-CM | POA: Diagnosis not present

## 2018-05-22 DIAGNOSIS — C73 Malignant neoplasm of thyroid gland: Secondary | ICD-10-CM | POA: Diagnosis not present

## 2018-05-24 ENCOUNTER — Telehealth: Payer: Self-pay | Admitting: Family Medicine

## 2018-05-24 NOTE — Telephone Encounter (Signed)
Copied from [place] [id number]. Topic: Quick Communication - See Telephone Encounter >> May 24, 2018  2:06 PM [name] wrote: CRM for notification. See Telephone encounter for: 05/24/18.  Patient said she had labs done on Tuesday at [facility] in [place] with her cancer doctor. She said she did the same labs as she would a physical and was not sure if her insurance would pay twice for labs. Please advise. She is suppose to come in for her labs for her physical with Dr Damita Dunnings on 7/31

## 2018-05-25 NOTE — Telephone Encounter (Signed)
Please have her hold off on doing any extra labs at this point.  Please see if she can bring a copy of those labs that were recently done to her appointment here and we will go from there.  The cancer clinic should be scanning those labs into the EMR but there may be a lag time with scanning.  Thanks.

## 2018-05-25 NOTE — Telephone Encounter (Signed)
Patient advised.  Lab appt canceled.

## 2018-05-29 ENCOUNTER — Other Ambulatory Visit: Payer: PPO

## 2018-05-30 ENCOUNTER — Encounter: Payer: Self-pay | Admitting: Internal Medicine

## 2018-05-30 ENCOUNTER — Inpatient Hospital Stay: Payer: PPO | Attending: Internal Medicine | Admitting: Internal Medicine

## 2018-05-30 VITALS — BP 120/74 | HR 87 | Temp 97.8°F | Resp 16

## 2018-05-30 DIAGNOSIS — Z85850 Personal history of malignant neoplasm of thyroid: Secondary | ICD-10-CM | POA: Diagnosis not present

## 2018-05-30 DIAGNOSIS — C73 Malignant neoplasm of thyroid gland: Secondary | ICD-10-CM

## 2018-05-30 DIAGNOSIS — M546 Pain in thoracic spine: Secondary | ICD-10-CM

## 2018-05-30 DIAGNOSIS — Z801 Family history of malignant neoplasm of trachea, bronchus and lung: Secondary | ICD-10-CM | POA: Diagnosis not present

## 2018-05-30 DIAGNOSIS — C3431 Malignant neoplasm of lower lobe, right bronchus or lung: Secondary | ICD-10-CM

## 2018-05-30 NOTE — Assessment & Plan Note (Addendum)
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 CT Chest Jan 2019- NED. Clinically no evidence of recurrence; stable however see discussion regarding worsening back pain.  # Thyroid Ca s/p thyroidectomy- STAGE I /LOW risk - on Synthroid 75 mcg /day [goal 0.1 to 0.5 ].  Clinically no evidence of recurrence; stable-how to see discussion regarding worsening back pain  #Worsening back pain unclear etiology recommend thoracic spine MRI with and without contrast ASAP.  Will call patient with results.   # MRI T spine ASAP; will call with results [ph- cell- [phone]]; will decide on follow up likely in 6 months/plan CT prior.  Cc; Dr.Duncan.

## 2018-05-30 NOTE — Progress Notes (Signed)
[place] OFFICE PROGRESS NOTE  Patient Care Team: Tonia Ghent, MD as PCP - General (Family Medicine) Beverly Gust, MD as Consulting Physician (Otolaryngology) Nestor Lewandowsky, MD as Consulting Physician (Cardiothoracic Surgery) Cammie Sickle, MD as Consulting Physician (Internal Medicine) Birder Robson, MD as Consulting Physician (Ophthalmology) Clayburn Pert, MD as Consulting Physician (General Surgery)  Cancer Staging Thyroid cancer [facility]) Staging form: Thyroid - Papillary or Follicular (Under 45 years), AJCC 7th Edition - Clinical: Stage I (T1, N0, M0) - Signed by Forest Gleason, MD on 09/11/2015    Oncology History   # NOV 2016- RLL [place] ca T1N0 [incidental; Dr.Simonds; Bronc- s/p RLlobectomy; Dr.Oaks;Dec 2016]  # FEB 2017- 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 STAGE I; s/p Total Thyroidetcomy [Dr.Chapman];NO RAIU.  LOW RISK; but detectable thyroglobulin- on Synthroid     Thyroid cancer (HCC)    Cancer of lower lobe of right lung ([place])   05/06/2016 Initial Diagnosis    Malignant neoplasm of lower lobe, right bronchus or lung (HCC)       INTERVAL HISTORY:  [name] 72 y.o.  female pleasant patient above history of stage I lung cancer and also stage I thyroid cancer currently under surveillance is here for follow-up.  Patient notes to have worsening pain of her intrascapular pain in the last 2 months also.  Radiating down into the arms.  5-6 on a scale of 10.  Not improving.  Review of Systems  Constitutional: Negative for chills, diaphoresis, fever, malaise/fatigue and weight loss.  HENT: Negative for nosebleeds and sore throat.   Eyes: Negative for double vision.  Respiratory: Negative for cough, hemoptysis, sputum production, shortness of breath and wheezing.   Cardiovascular: Negative for chest pain (Chest wall pain/neuropathic pain/postthoracotomy pain),  palpitations, orthopnea and leg swelling.  Gastrointestinal: Negative for abdominal pain, blood in stool, constipation, diarrhea, heartburn, melena, nausea and vomiting.  Genitourinary: Negative for dysuria, frequency and urgency.  Musculoskeletal: Positive for back pain and joint pain.  Skin: Negative.  Negative for itching and rash.  Neurological: Negative for dizziness, tingling, focal weakness, weakness and headaches.  Endo/Heme/Allergies: Does not bruise/bleed easily.  Psychiatric/Behavioral: Negative for depression. The patient is not nervous/anxious and does not have insomnia.       PAST MEDICAL HISTORY :  Past Medical History:  Diagnosis Date  . Cancer of lung ([place]) 10/01/2015  . GERD (gastroesophageal reflux disease)   . Groin pain    Along superior/laterl R inguinal canal.  Could be due to hernia or old scar tissue.  prev with CT done w/o alarming findings.  Will treat episodically.  Use tylenol #3 prn.    . Hyperlipidemia   . Lipoma of thigh    Left  . Normal cardiac stress test 2014  . PONV (postoperative nausea and vomiting)    nausea after hysterectomy, and 2 days after lung surgery 10/2015  . Thyroid cancer ([place])   . Thyroid nodule     PAST SURGICAL HISTORY :   Past Surgical History:  Procedure Laterality Date  . ABDOMINAL HYSTERECTOMY  12-30-08  . APPENDECTOMY  11/23/2016   Dr. Adonis Huguenin  . BREAST EXCISIONAL BIOPSY Right 1977  . BREAST MASS EXCISION  1977   benign  . CARPAL TUNNEL RELEASE Right 1978  . CATARACT EXTRACTION, BILATERAL Bilateral   . CHOLECYSTECTOMY  11/23/2016   Procedure: LAPAROSCOPIC CHOLECYSTECTOMY;  Surgeon: Clayburn Pert, MD;  Location: [facility] ORS;  Service: General;;  .  COLONOSCOPY  2009  . DILATION AND CURETTAGE OF UTERUS  2001  . ELECTROMAGNETIC NAVIGATION BROCHOSCOPY N/A 08/25/2015   Procedure: ELECTROMAGNETIC NAVIGATION BRONCHOSCOPY;  Surgeon: Flora Lipps, MD;  Location: [facility] ORS;  Service: Cardiopulmonary;  Laterality: N/A;  .  ENDOBRONCHIAL ULTRASOUND N/A 08/25/2015   Procedure: ENDOBRONCHIAL ULTRASOUND;  Surgeon: Flora Lipps, MD;  Location: [facility] ORS;  Service: Cardiopulmonary;  Laterality: N/A;  . ESOPHAGOGASTRODUODENOSCOPY  2002, 11/15/07   Normal (Dr. Allyn Kenner)  . FACIAL COSMETIC SURGERY  01/13/10   mini facelift  . HERNIA REPAIR  1976  . THORACOTOMY/LOBECTOMY Right 10/27/2015   Procedure: THORACOTOMY/LOBECTOMY;  Surgeon: Nestor Lewandowsky, MD;  Location: [facility] ORS;  Service: Thoracic;  Laterality: Right;  . THYROIDECTOMY N/A 12/29/2015   Procedure: THYROIDECTOMY;  Surgeon: Beverly Gust, MD;  Location: [facility] ORS;  Service: ENT;  Laterality: N/A;  . TOTAL VAGINAL HYSTERECTOMY  12/30/08   for fibroid pain (Dr. Gertie Fey)    FAMILY HISTORY :   Family History  Problem Relation Age of Onset  . Transient ischemic attack Mother   . Hypertension Mother   . Stroke Mother        mini strokes  . Hypertension Sister   . Cancer Sister        ovarian  . Hypertension Sister   . Hypertension Sister   . Hypertension Sister   . Breast cancer Neg Hx   . Colon cancer Neg Hx     SOCIAL HISTORY:   Social History   Tobacco Use  . Smoking status: Never Smoker  . Smokeless tobacco: Never Used  Substance Use Topics  . Alcohol use: No    Alcohol/week: 0.0 standard drinks  . Drug use: No    ALLERGIES:  is allergic to atorvastatin; ciprofloxacin; epinephrine; metoprolol succinate; simvastatin; and sulfonamide derivatives.  MEDICATIONS:  Current Outpatient Medications  Medication Sig Dispense Refill  . Ascorbic Acid (VITAMIN C) 500 MG tablet Take 2,000 mg by mouth daily. Reported on 02/04/2016    . Biotin (PA BIOTIN) 1000 MCG tablet Take 1,000 mcg by mouth daily. Reported on 02/04/2016    . CALCIUM-MAG-VIT C-VIT D PO Take by mouth daily.    . Cholecalciferol (VITAMIN D-3) 1000 units CAPS Take 1 capsule by mouth daily with supper.     . Coenzyme Q10 200 MG capsule Take 200 mg by mouth daily.    . Folic Acid-Vit V7-BLT J03  (FA-VITAMIN B-6-VITAMIN B-12 PO) Take 1 tablet by mouth daily after breakfast. Reported on 02/04/2016    . levothyroxine (SYNTHROID, LEVOTHROID) 75 MCG tablet Take 1 tablet (75 mcg total) by mouth daily before breakfast. 90 tablet 6  . Lutein 20 MG CAPS Take 20 mg by mouth daily.    . Omega-3 Fatty Acids (SUPER OMEGA 3 EPA/DHA) 1000 MG CAPS Take 1,000 mg by mouth daily with supper.    Marland Kitchen omeprazole (PRILOSEC) 40 MG capsule Take 1 capsule (40 mg total) by mouth daily. 90 capsule 3  . Potassium Gluconate 595 MG CAPS Take 595 mg by mouth daily.     . pravastatin (PRAVACHOL) 40 MG tablet TAKE 1 TABLET (40 MG TOTAL) BY MOUTH DAILY. 90 tablet 3   No current facility-administered medications for this visit.     PHYSICAL EXAMINATION: ECOG PERFORMANCE STATUS: 1 - Symptomatic but completely ambulatory  BP 120/74 (BP Location: Left Arm, Patient Position: Sitting)   Pulse 87   Temp 97.8 F (36.6 C) (Tympanic)   Resp 16   There were no vitals filed for this visit.  GENERAL: Well-nourished well-developed; Alert, no distress and comfortable.  Alone. EYES: no pallor or icterus OROPHARYNX: no thrush or ulceration; NECK: supple; no lymph nodes felt. LYMPH:  no palpable lymphadenopathy in the axillary or inguinal regions LUNGS: Decreased breath sounds auscultation bilaterally. No wheeze or crackles HEART/CVS: regular rate & rhythm and no murmurs; No lower extremity edema ABDOMEN:abdomen soft, non-tender and normal bowel sounds. No hepatomegaly or splenomegaly.  Musculoskeletal:no cyanosis of digits and no clubbing  PSYCH: alert & oriented x 3 with fluent speech NEURO: no focal motor/sensory deficits SKIN:  no rashes or significant lesions    LABORATORY DATA:  I have reviewed the data as listed    Component Value Date/Time   NA 139 10/02/2017 1627   NA 141 07/01/2013 0103   K 4.0 10/02/2017 1627   K 3.8 07/01/2013 0103   CL 103 10/02/2017 1627   CL 110 (H) 07/01/2013 0103   CO2 31 10/02/2017  1627   CO2 27 07/01/2013 0103   GLUCOSE 99 10/02/2017 1627   GLUCOSE 88 07/01/2013 0103   BUN 12 10/02/2017 1627   BUN 11 07/01/2013 0103   CREATININE 0.80 06/05/2018 1520   CREATININE 0.68 07/01/2013 0103   CALCIUM 9.3 10/02/2017 1627   CALCIUM 8.2 (L) 07/01/2013 0103   PROT 6.5 10/02/2017 1627   PROT 5.5 (L) 07/01/2013 0103   ALBUMIN 4.3 10/02/2017 1627   ALBUMIN 3.0 (L) 07/01/2013 0103   AST 17 10/02/2017 1627   AST 19 07/01/2013 0103   ALT 16 10/02/2017 1627   ALT 24 07/01/2013 0103   ALKPHOS 39 10/02/2017 1627   ALKPHOS 55 07/01/2013 0103   BILITOT 0.4 10/02/2017 1627   BILITOT 0.7 07/01/2013 0103   GFRNONAA >60 03/20/2017 1251   GFRNONAA >60 07/01/2013 0103   GFRAA >60 03/20/2017 1251   GFRAA >60 07/01/2013 0103    No results found for: SPEP, UPEP  Lab Results  Component Value Date   WBC 6.1 10/02/2017   NEUTROABS 3.4 10/02/2017   HGB 13.3 10/02/2017   HCT 39.6 10/02/2017   MCV 92.3 10/02/2017   PLT 133.0 (L) 10/02/2017      Chemistry      Component Value Date/Time   NA 139 10/02/2017 1627   NA 141 07/01/2013 0103   K 4.0 10/02/2017 1627   K 3.8 07/01/2013 0103   CL 103 10/02/2017 1627   CL 110 (H) 07/01/2013 0103   CO2 31 10/02/2017 1627   CO2 27 07/01/2013 0103   BUN 12 10/02/2017 1627   BUN 11 07/01/2013 0103   CREATININE 0.80 06/05/2018 1520   CREATININE 0.68 07/01/2013 0103      Component Value Date/Time   CALCIUM 9.3 10/02/2017 1627   CALCIUM 8.2 (L) 07/01/2013 0103   ALKPHOS 39 10/02/2017 1627   ALKPHOS 55 07/01/2013 0103   AST 17 10/02/2017 1627   AST 19 07/01/2013 0103   ALT 16 10/02/2017 1627   ALT 24 07/01/2013 0103   BILITOT 0.4 10/02/2017 1627   BILITOT 0.7 07/01/2013 0103       RADIOGRAPHIC STUDIES: I have personally reviewed the radiological images as listed and agreed with the findings in the report. No results found.   ASSESSMENT & PLAN:  Thyroid cancer ([place]) # RLL Lung ca; stage I no adjuvant therapy. CT Chest Jan  2019- NED. Clinically no evidence of recurrence; stable however see discussion regarding worsening back pain.  # Thyroid Ca s/p thyroidectomy- STAGE I /LOW risk - on Synthroid 75 mcg /day [  goal 0.1 to 0.5 ].  Clinically no evidence of recurrence; stable-how to see discussion regarding worsening back pain  #Worsening back pain unclear etiology recommend thoracic spine MRI with and without contrast ASAP.  Will call patient with results.   # MRI T spine ASAP; will call with results [ph- cell- [phone]]; will decide on follow up likely in 6 months/plan CT prior.  Cc; Dr.Duncan.     Orders Placed This Encounter  Procedures  . MR Thoracic Spine W Wo Contrast    Standing Status:   Future    Number of Occurrences:   1    Standing Expiration Date:   05/30/2019    Order Specific Question:   ** REASON FOR EXAM (FREE TEXT)    Answer:   hx of lung cancer; worsening back pain    Order Specific Question:   GRA to provide read?    Answer:   Yes    Order Specific Question:   If indicated for the ordered procedure, I authorize the administration of contrast media per Radiology protocol    Answer:   Yes    Order Specific Question:   What is the patient's sedation requirement?    Answer:   No Sedation    Order Specific Question:   Use SRS Protocol?    Answer:   No    Order Specific Question:   Does the patient have a pacemaker or implanted devices?    Answer:   No    Order Specific Question:   Preferred imaging location?    Answer:   [facility] (table limit-300lbs)    Order Specific Question:   Radiology Contrast Protocol - do NOT remove file path    Answer:   [email or web address]   All questions were answered. The patient knows to call the clinic with any problems, questions or concerns.      Cammie Sickle, MD 06/10/2018 7:38 PM

## 2018-05-31 ENCOUNTER — Other Ambulatory Visit: Payer: Self-pay | Admitting: Family Medicine

## 2018-05-31 DIAGNOSIS — Z1231 Encounter for screening mammogram for malignant neoplasm of breast: Secondary | ICD-10-CM

## 2018-06-04 ENCOUNTER — Encounter: Payer: PPO | Admitting: Family Medicine

## 2018-06-05 ENCOUNTER — Ambulatory Visit
Admission: RE | Admit: 2018-06-05 | Discharge: 2018-06-05 | Disposition: A | Discharge status: Home / Self Care | Payer: PPO | Source: Ambulatory Visit | Attending: Internal Medicine | Admitting: Internal Medicine

## 2018-06-05 DIAGNOSIS — C3431 Malignant neoplasm of lower lobe, right bronchus or lung: Secondary | ICD-10-CM

## 2018-06-05 DIAGNOSIS — C3491 Malignant neoplasm of unspecified part of right bronchus or lung: Secondary | ICD-10-CM | POA: Diagnosis not present

## 2018-06-05 DIAGNOSIS — M546 Pain in thoracic spine: Secondary | ICD-10-CM

## 2018-06-05 LAB — POCT I-STAT CREATININE: Creatinine, Ser: 0.8 mg/dL (ref 0.44–1.00)

## 2018-06-05 MED ORDER — GADOBENATE DIMEGLUMINE 529 MG/ML IV SOLN
10.0000 mL | Freq: Once | INTRAVENOUS | Status: AC | PRN
  Administered 2018-06-05: 10 mL via INTRAVENOUS

## 2018-06-06 ENCOUNTER — Telehealth: Payer: Self-pay | Admitting: Internal Medicine

## 2018-06-06 DIAGNOSIS — C3431 Malignant neoplasm of lower lobe, right bronchus or lung: Secondary | ICD-10-CM

## 2018-06-06 NOTE — Telephone Encounter (Signed)
Spoke to pt re: results of MRI.  # recommend CT scan c/a/p- asap.  # follow up with me 1-2 days post CT scans.   THx

## 2018-06-07 ENCOUNTER — Ambulatory Visit
Admission: RE | Admit: 2018-06-07 | Discharge: 2018-06-07 | Disposition: A | Discharge status: Home / Self Care | Payer: PPO | Source: Ambulatory Visit | Attending: Internal Medicine | Admitting: Internal Medicine

## 2018-06-07 ENCOUNTER — Encounter: Payer: PPO | Admitting: Family Medicine

## 2018-06-07 DIAGNOSIS — Z902 Acquired absence of lung [part of]: Secondary | ICD-10-CM | POA: Diagnosis not present

## 2018-06-07 DIAGNOSIS — K7689 Other specified diseases of liver: Secondary | ICD-10-CM | POA: Diagnosis not present

## 2018-06-07 DIAGNOSIS — I700 Atherosclerosis of aorta: Secondary | ICD-10-CM | POA: Insufficient documentation

## 2018-06-07 DIAGNOSIS — C73 Malignant neoplasm of thyroid gland: Secondary | ICD-10-CM | POA: Diagnosis not present

## 2018-06-07 DIAGNOSIS — M5136 Other intervertebral disc degeneration, lumbar region: Secondary | ICD-10-CM | POA: Insufficient documentation

## 2018-06-07 DIAGNOSIS — K769 Liver disease, unspecified: Secondary | ICD-10-CM | POA: Diagnosis not present

## 2018-06-07 DIAGNOSIS — C3490 Malignant neoplasm of unspecified part of unspecified bronchus or lung: Secondary | ICD-10-CM | POA: Diagnosis not present

## 2018-06-07 DIAGNOSIS — M899 Disorder of bone, unspecified: Secondary | ICD-10-CM | POA: Insufficient documentation

## 2018-06-07 DIAGNOSIS — C3431 Malignant neoplasm of lower lobe, right bronchus or lung: Secondary | ICD-10-CM

## 2018-06-07 MED ORDER — IOHEXOL 300 MG/ML  SOLN
80.0000 mL | Freq: Once | INTRAMUSCULAR | Status: AC | PRN
  Administered 2018-06-07: 80 mL via INTRAVENOUS

## 2018-06-08 ENCOUNTER — Inpatient Hospital Stay: Payer: PPO | Attending: Internal Medicine | Admitting: Internal Medicine

## 2018-06-08 VITALS — BP 153/81 | HR 88 | Temp 97.1°F | Resp 16 | Wt 120.4 lb

## 2018-06-08 DIAGNOSIS — R0789 Other chest pain: Secondary | ICD-10-CM | POA: Diagnosis not present

## 2018-06-08 DIAGNOSIS — C73 Malignant neoplasm of thyroid gland: Secondary | ICD-10-CM

## 2018-06-08 DIAGNOSIS — C3431 Malignant neoplasm of lower lobe, right bronchus or lung: Secondary | ICD-10-CM | POA: Diagnosis not present

## 2018-06-08 DIAGNOSIS — Z79899 Other long term (current) drug therapy: Secondary | ICD-10-CM | POA: Insufficient documentation

## 2018-06-08 DIAGNOSIS — G6289 Other specified polyneuropathies: Secondary | ICD-10-CM | POA: Diagnosis not present

## 2018-06-08 DIAGNOSIS — K769 Liver disease, unspecified: Secondary | ICD-10-CM | POA: Insufficient documentation

## 2018-06-08 DIAGNOSIS — G959 Disease of spinal cord, unspecified: Secondary | ICD-10-CM | POA: Diagnosis not present

## 2018-06-08 DIAGNOSIS — M8580 Other specified disorders of bone density and structure, unspecified site: Secondary | ICD-10-CM | POA: Insufficient documentation

## 2018-06-08 DIAGNOSIS — R140 Abdominal distension (gaseous): Secondary | ICD-10-CM | POA: Diagnosis not present

## 2018-06-08 NOTE — Progress Notes (Signed)
[place] OFFICE PROGRESS NOTE  Patient Care Team: Tonia Ghent, MD as PCP - General (Family Medicine) Beverly Gust, MD as Consulting Physician (Otolaryngology) Nestor Lewandowsky, MD as Consulting Physician (Cardiothoracic Surgery) Cammie Sickle, MD as Consulting Physician (Internal Medicine) Birder Robson, MD as Consulting Physician (Ophthalmology) Clayburn Pert, MD as Consulting Physician (General Surgery)  Cancer Staging Thyroid cancer [facility]) Staging form: Thyroid - Papillary or Follicular (Under 45 years), AJCC 7th Edition - Clinical: Stage I (T1, N0, M0) - Signed by Forest Gleason, MD on 09/11/2015    Oncology History   # NOV 2016- RLL [place] ca T1N0 [incidental; Dr.Simonds; Bronc- s/p RLlobectomy; Dr.Oaks;Dec 2016]  # FEB 2017- 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 STAGE I; s/p Total Thyroidetcomy [Dr.Chapman];NO RAIU.  LOW RISK; but detectable thyroglobulin- on Synthroid     Thyroid cancer (HCC)    Cancer of lower lobe of right lung ([place])   05/06/2016 Initial Diagnosis    Malignant neoplasm of lower lobe, right bronchus or lung (HCC)       INTERVAL HISTORY:  [name] 72 y.o.  female pleasant patient above history of stage I lung cancer and also stage I thyroid cancer currently on surveillance is here for follow-up given the worsening intrascapular pain.  Patient states that she had worsening interscapular pain for the last 2 months or so.  Progressive getting worse.  Denies any neurologic deficits.  Chronic nausea.  No vomiting.  No headaches.  Patient had an MRI of the thoracic spine that showed enhancing lesions in thoracic vertebrae; which led to a further CT scan of the chest and pelvis.   Review of Systems  Constitutional: Negative for chills, diaphoresis, fever, malaise/fatigue and weight loss.  HENT: Negative for nosebleeds and sore throat.   Eyes:  Negative for double vision.  Respiratory: Negative for cough, hemoptysis, sputum production, shortness of breath and wheezing.   Cardiovascular: Negative for chest pain, palpitations, orthopnea and leg swelling.  Gastrointestinal: Positive for nausea. Negative for abdominal pain, blood in stool, constipation, diarrhea, heartburn, melena and vomiting.  Genitourinary: Negative for dysuria, frequency and urgency.  Musculoskeletal: Positive for back pain. Negative for joint pain.  Skin: Negative.  Negative for itching and rash.  Neurological: Negative for dizziness, tingling, focal weakness, weakness and headaches.  Endo/Heme/Allergies: Does not bruise/bleed easily.  Psychiatric/Behavioral: Negative for depression. The patient is not nervous/anxious and does not have insomnia.       PAST MEDICAL HISTORY :  Past Medical History:  Diagnosis Date  . Cancer of lung ([place]) 10/01/2015  . GERD (gastroesophageal reflux disease)   . Groin pain    Along superior/laterl R inguinal canal.  Could be due to hernia or old scar tissue.  prev with CT done w/o alarming findings.  Will treat episodically.  Use tylenol #3 prn.    . Hyperlipidemia   . Lipoma of thigh    Left  . Normal cardiac stress test 2014  . PONV (postoperative nausea and vomiting)    nausea after hysterectomy, and 2 days after lung surgery 10/2015  . Thyroid cancer ([place])   . Thyroid nodule     PAST SURGICAL HISTORY :   Past Surgical History:  Procedure Laterality Date  . ABDOMINAL HYSTERECTOMY  12-30-08  . APPENDECTOMY  11/23/2016   Dr. Adonis Huguenin  . BREAST EXCISIONAL BIOPSY Right 1977  . BREAST MASS EXCISION  1977   benign  . CARPAL TUNNEL RELEASE  Right 1978  . CATARACT EXTRACTION, BILATERAL Bilateral   . CHOLECYSTECTOMY  11/23/2016   Procedure: LAPAROSCOPIC CHOLECYSTECTOMY;  Surgeon: Clayburn Pert, MD;  Location: [facility] ORS;  Service: General;;  . COLONOSCOPY  2009  . DILATION AND CURETTAGE OF UTERUS  2001  . ELECTROMAGNETIC  NAVIGATION BROCHOSCOPY N/A 08/25/2015   Procedure: ELECTROMAGNETIC NAVIGATION BRONCHOSCOPY;  Surgeon: Flora Lipps, MD;  Location: [facility] ORS;  Service: Cardiopulmonary;  Laterality: N/A;  . ENDOBRONCHIAL ULTRASOUND N/A 08/25/2015   Procedure: ENDOBRONCHIAL ULTRASOUND;  Surgeon: Flora Lipps, MD;  Location: [facility] ORS;  Service: Cardiopulmonary;  Laterality: N/A;  . ESOPHAGOGASTRODUODENOSCOPY  2002, 11/15/07   Normal (Dr. Allyn Kenner)  . FACIAL COSMETIC SURGERY  01/13/10   mini facelift  . HERNIA REPAIR  1976  . THORACOTOMY/LOBECTOMY Right 10/27/2015   Procedure: THORACOTOMY/LOBECTOMY;  Surgeon: Nestor Lewandowsky, MD;  Location: [facility] ORS;  Service: Thoracic;  Laterality: Right;  . THYROIDECTOMY N/A 12/29/2015   Procedure: THYROIDECTOMY;  Surgeon: Beverly Gust, MD;  Location: [facility] ORS;  Service: ENT;  Laterality: N/A;  . TOTAL VAGINAL HYSTERECTOMY  12/30/08   for fibroid pain (Dr. Gertie Fey)    FAMILY HISTORY :   Family History  Problem Relation Age of Onset  . Transient ischemic attack Mother   . Hypertension Mother   . Stroke Mother        mini strokes  . Hypertension Sister   . Cancer Sister        ovarian  . Hypertension Sister   . Hypertension Sister   . Hypertension Sister   . Breast cancer Neg Hx   . Colon cancer Neg Hx     SOCIAL HISTORY:   Social History   Tobacco Use  . Smoking status: Never Smoker  . Smokeless tobacco: Never Used  Substance Use Topics  . Alcohol use: No    Alcohol/week: 0.0 standard drinks  . Drug use: No    ALLERGIES:  is allergic to atorvastatin; ciprofloxacin; epinephrine; metoprolol succinate; simvastatin; and sulfonamide derivatives.  MEDICATIONS:  Current Outpatient Medications  Medication Sig Dispense Refill  . Ascorbic Acid (VITAMIN C) 500 MG tablet Take 2,000 mg by mouth daily. Reported on 02/04/2016    . Biotin (PA BIOTIN) 1000 MCG tablet Take 1,000 mcg by mouth daily. Reported on 02/04/2016    . CALCIUM-MAG-VIT C-VIT D PO Take by mouth daily.    .  Cholecalciferol (VITAMIN D-3) 1000 units CAPS Take 1 capsule by mouth daily with supper.     . Coenzyme Q10 200 MG capsule Take 200 mg by mouth daily.    . Folic Acid-Vit D8-YME B58 (FA-VITAMIN B-6-VITAMIN B-12 PO) Take 1 tablet by mouth daily after breakfast. Reported on 02/04/2016    . levothyroxine (SYNTHROID, LEVOTHROID) 75 MCG tablet Take 1 tablet (75 mcg total) by mouth daily before breakfast. 90 tablet 6  . Lutein 20 MG CAPS Take 20 mg by mouth daily.    . Omega-3 Fatty Acids (SUPER OMEGA 3 EPA/DHA) 1000 MG CAPS Take 1,000 mg by mouth daily with supper.    Marland Kitchen omeprazole (PRILOSEC) 40 MG capsule Take 1 capsule (40 mg total) by mouth daily. 90 capsule 3  . Potassium Gluconate 595 MG CAPS Take 595 mg by mouth daily.     . pravastatin (PRAVACHOL) 40 MG tablet TAKE 1 TABLET (40 MG TOTAL) BY MOUTH DAILY. 90 tablet 3   No current facility-administered medications for this visit.     PHYSICAL EXAMINATION: ECOG PERFORMANCE STATUS: 1 - Symptomatic but completely ambulatory  BP (!) 153/81 (  BP Location: Left Arm, Patient Position: Sitting)   Pulse 88   Temp (!) 97.1 F (36.2 C) (Tympanic)   Resp 16   Wt 120 lb 6.4 oz (54.6 kg)   BMI 20.67 kg/m   Filed Weights   06/08/18 1412  Weight: 120 lb 6.4 oz (54.6 kg)    GENERAL: Well-nourished well-developed; Alert, no distress and comfortable.  Accompanied by family.   EYES: no pallor or icterus OROPHARYNX: no thrush or ulceration; NECK: supple; no lymph nodes felt. LYMPH:  no palpable lymphadenopathy in the axillary or inguinal regions LUNGS: Decreased breath sounds auscultation bilaterally. No wheeze or crackles HEART/CVS: regular rate & rhythm and no murmurs; No lower extremity edema ABDOMEN:abdomen soft, non-tender and normal bowel sounds. No hepatomegaly or splenomegaly.  Musculoskeletal:no cyanosis of digits and no clubbing  PSYCH: alert & oriented x 3 with fluent speech NEURO: no focal motor/sensory deficits SKIN:  no rashes or  significant lesions    LABORATORY DATA:  I have reviewed the data as listed    Component Value Date/Time   NA 139 10/02/2017 1627   NA 141 07/01/2013 0103   K 4.0 10/02/2017 1627   K 3.8 07/01/2013 0103   CL 103 10/02/2017 1627   CL 110 (H) 07/01/2013 0103   CO2 31 10/02/2017 1627   CO2 27 07/01/2013 0103   GLUCOSE 99 10/02/2017 1627   GLUCOSE 88 07/01/2013 0103   BUN 12 10/02/2017 1627   BUN 11 07/01/2013 0103   CREATININE 0.80 06/05/2018 1520   CREATININE 0.68 07/01/2013 0103   CALCIUM 9.3 10/02/2017 1627   CALCIUM 8.2 (L) 07/01/2013 0103   PROT 6.5 10/02/2017 1627   PROT 5.5 (L) 07/01/2013 0103   ALBUMIN 4.3 10/02/2017 1627   ALBUMIN 3.0 (L) 07/01/2013 0103   AST 17 10/02/2017 1627   AST 19 07/01/2013 0103   ALT 16 10/02/2017 1627   ALT 24 07/01/2013 0103   ALKPHOS 39 10/02/2017 1627   ALKPHOS 55 07/01/2013 0103   BILITOT 0.4 10/02/2017 1627   BILITOT 0.7 07/01/2013 0103   GFRNONAA >60 03/20/2017 1251   GFRNONAA >60 07/01/2013 0103   GFRAA >60 03/20/2017 1251   GFRAA >60 07/01/2013 0103    No results found for: SPEP, UPEP  Lab Results  Component Value Date   WBC 6.1 10/02/2017   NEUTROABS 3.4 10/02/2017   HGB 13.3 10/02/2017   HCT 39.6 10/02/2017   MCV 92.3 10/02/2017   PLT 133.0 (L) 10/02/2017      Chemistry      Component Value Date/Time   NA 139 10/02/2017 1627   NA 141 07/01/2013 0103   K 4.0 10/02/2017 1627   K 3.8 07/01/2013 0103   CL 103 10/02/2017 1627   CL 110 (H) 07/01/2013 0103   CO2 31 10/02/2017 1627   CO2 27 07/01/2013 0103   BUN 12 10/02/2017 1627   BUN 11 07/01/2013 0103   CREATININE 0.80 06/05/2018 1520   CREATININE 0.68 07/01/2013 0103      Component Value Date/Time   CALCIUM 9.3 10/02/2017 1627   CALCIUM 8.2 (L) 07/01/2013 0103   ALKPHOS 39 10/02/2017 1627   ALKPHOS 55 07/01/2013 0103   AST 17 10/02/2017 1627   AST 19 07/01/2013 0103   ALT 16 10/02/2017 1627   ALT 24 07/01/2013 0103   BILITOT 0.4 10/02/2017 1627    BILITOT 0.7 07/01/2013 0103       RADIOGRAPHIC STUDIES: I have personally reviewed the radiological images as listed and agreed  with the findings in the report. No results found.   ASSESSMENT & PLAN:  Cancer of lower lobe of right lung ([place]) # RLL Lung ca; stage I no adjuvant therapy.  See discussion below  #Thyroid cancer status post thyroidectomy low risk stage I; on Synthroid 75 mcg.  Negative thyroglobulin's positive antibodies.  See discussion below.  # Interscapular pain for the last 2 months 5 on a scale of 10-thoracic spine MRI enhancing lesions in the thoracic spine; liver-likely benign hemangiomas; CT scan chest and pelvis-shows no obvious primary malignancy; however continues to show multiple subcentimeter lesions in the liver chronic; and some in the thoracic spine chronic.  However recommend PET scan for further evaluation recommendations.   # abdominal bloating status post gallbladder surgery-chronic on colestipol.  #Chronic right-sided chest wall pain-neuropathic stable.  # osteopenia/ hypocalcemia- vit D+ ca BID; exercise  #Follow-up TBD based on scan-; PEt scan; will call with resuts.  # I reviewed the blood work- with the patient in detail; also reviewed the imaging independently [as summarized above]; and with the patient in detail.    Cc; Dr.Duncan.       Orders Placed This Encounter  Procedures  . NM PET Image Restag (PS) Skull Base To Thigh    Standing Status:   Future    Standing Expiration Date:   06/08/2019    Order Specific Question:   ** REASON FOR EXAM (FREE TEXT)    Answer:   hx of thyroid cancer/ lung cancer/ multiple lesions in bones    Order Specific Question:   If indicated for the ordered procedure, I authorize the administration of a radiopharmaceutical per Radiology protocol    Answer:   Yes    Order Specific Question:   Preferred imaging location?    Answer:   [facility]    Order Specific Question:   Radiology Contrast Protocol -  do NOT remove file path    Answer:   [email or web address]   All questions were answered. The patient knows to call the clinic with any problems, questions or concerns.      Cammie Sickle, MD 06/10/2018 3:24 PM

## 2018-06-08 NOTE — Assessment & Plan Note (Addendum)
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  See discussion below  #Thyroid cancer status post thyroidectomy low risk stage I; on Synthroid 75 mcg.  Negative thyroglobulin's positive antibodies.  See discussion below.  # Interscapular pain for the last 2 months 5 on a scale of 10-thoracic spine MRI enhancing lesions in the thoracic spine; liver-likely benign hemangiomas; CT scan chest and pelvis-shows no obvious primary malignancy; however continues to show multiple subcentimeter lesions in the liver chronic; and some in the thoracic spine chronic.  However recommend PET scan for further evaluation recommendations.   # abdominal bloating status post gallbladder surgery-chronic on colestipol.  #Chronic right-sided chest wall pain-neuropathic stable.  # osteopenia/ hypocalcemia- vit D+ ca BID; exercise  #Follow-up TBD based on scan-; PEt scan; will call with resuts.  # I reviewed the blood work- with the patient in detail; also reviewed the imaging independently [as summarized above]; and with the patient in detail.    Cc; Dr.Duncan.

## 2018-06-12 ENCOUNTER — Other Ambulatory Visit: Payer: Self-pay | Admitting: Family Medicine

## 2018-06-13 ENCOUNTER — Ambulatory Visit
Admission: RE | Admit: 2018-06-13 | Discharge: 2018-06-13 | Disposition: A | Discharge status: Home / Self Care | Payer: PPO | Source: Ambulatory Visit | Attending: Internal Medicine | Admitting: Internal Medicine

## 2018-06-13 DIAGNOSIS — Z902 Acquired absence of lung [part of]: Secondary | ICD-10-CM | POA: Insufficient documentation

## 2018-06-13 DIAGNOSIS — M899 Disorder of bone, unspecified: Secondary | ICD-10-CM | POA: Insufficient documentation

## 2018-06-13 DIAGNOSIS — K561 Intussusception: Secondary | ICD-10-CM | POA: Diagnosis not present

## 2018-06-13 DIAGNOSIS — C3431 Malignant neoplasm of lower lobe, right bronchus or lung: Secondary | ICD-10-CM | POA: Diagnosis not present

## 2018-06-13 DIAGNOSIS — Z85850 Personal history of malignant neoplasm of thyroid: Secondary | ICD-10-CM | POA: Diagnosis not present

## 2018-06-13 DIAGNOSIS — C3491 Malignant neoplasm of unspecified part of right bronchus or lung: Secondary | ICD-10-CM | POA: Diagnosis not present

## 2018-06-13 DIAGNOSIS — I700 Atherosclerosis of aorta: Secondary | ICD-10-CM | POA: Diagnosis not present

## 2018-06-13 LAB — GLUCOSE, CAPILLARY: Glucose-Capillary: 79 mg/dL (ref 70–99)

## 2018-06-13 MED ORDER — FLUDEOXYGLUCOSE F - 18 (FDG) INJECTION
6.3900 | Freq: Once | INTRAVENOUS | Status: AC | PRN
  Administered 2018-06-13: 6.39 via INTRAVENOUS

## 2018-06-18 ENCOUNTER — Telehealth: Payer: Self-pay | Admitting: Internal Medicine

## 2018-06-18 ENCOUNTER — Telehealth: Payer: Self-pay | Admitting: Family Medicine

## 2018-06-18 DIAGNOSIS — M8580 Other specified disorders of bone density and structure, unspecified site: Secondary | ICD-10-CM

## 2018-06-18 DIAGNOSIS — E785 Hyperlipidemia, unspecified: Secondary | ICD-10-CM

## 2018-06-18 DIAGNOSIS — C73 Malignant neoplasm of thyroid gland: Secondary | ICD-10-CM

## 2018-06-18 DIAGNOSIS — C3431 Malignant neoplasm of lower lobe, right bronchus or lung: Secondary | ICD-10-CM

## 2018-06-18 NOTE — Telephone Encounter (Signed)
Copied from [place] [phone]. Topic: Inquiry >> Jun 18, 2018  1:42 PM [name] wrote: Reason for CRM: Pt would like to know if she needs to fast for an cholesterol check/office visit. Pt would like a call back.   Cb# [phone]

## 2018-06-18 NOTE — Telephone Encounter (Signed)
#  Spoke to patient regarding results of the PET scan-no obvious evidence of malignancy noted; however recommend a follow-up CT scan in approximately 4 months.  Re: Intussusception noted on the PET scan-asymptomatic.  Will discuss with Dr. Bary Castilla [patient seen Dr. Bary Castilla in the past].  #Please schedule follow-up CT scan chest with contrast in 4 months/follow-up with me few days later; no labs.

## 2018-06-18 NOTE — Telephone Encounter (Signed)
Patient wants to get her cholesterol done before her visit on Friday.  Please put in orders as she is coming 06/19/18.

## 2018-06-18 NOTE — Telephone Encounter (Signed)
I put in for vit D and lipid.  Thanks.

## 2018-06-18 NOTE — Telephone Encounter (Signed)
FYI- I spoke with Dr. Bary Castilla given the radiologic findings of " intussusception".  As patient is clinically asymptomatic-hold off further imaging/work-up.  Discussed with the patient she agrees.  Follow-up as planned in 4 months.

## 2018-06-19 ENCOUNTER — Other Ambulatory Visit (INDEPENDENT_AMBULATORY_CARE_PROVIDER_SITE_OTHER): Payer: PPO

## 2018-06-19 DIAGNOSIS — E785 Hyperlipidemia, unspecified: Secondary | ICD-10-CM | POA: Diagnosis not present

## 2018-06-19 LAB — LIPID PANEL
Cholesterol: 201 mg/dL — ABNORMAL HIGH (ref 0–200)
HDL: 44.8 mg/dL (ref >39.00)
LDL Cholesterol: 122 mg/dL — ABNORMAL HIGH (ref 0–99)
NonHDL: 155.8
Total CHOL/HDL Ratio: 4
Triglycerides: 167 mg/dL — ABNORMAL HIGH (ref 0.0–149.0)
VLDL: 33.4 mg/dL (ref 0.0–40.0)

## 2018-06-22 ENCOUNTER — Encounter: Payer: Self-pay | Admitting: Family Medicine

## 2018-06-22 ENCOUNTER — Ambulatory Visit (INDEPENDENT_AMBULATORY_CARE_PROVIDER_SITE_OTHER): Payer: PPO | Admitting: Family Medicine

## 2018-06-22 VITALS — BP 108/68 | HR 82 | Temp 98.4°F | Ht 64.0 in | Wt 122.2 lb

## 2018-06-22 DIAGNOSIS — E785 Hyperlipidemia, unspecified: Secondary | ICD-10-CM

## 2018-06-22 DIAGNOSIS — I700 Atherosclerosis of aorta: Secondary | ICD-10-CM

## 2018-06-22 MED ORDER — PRAVASTATIN SODIUM 40 MG PO TABS: 40.0000 mg | ORAL_TABLET | Freq: Every day | ORAL | Status: DC

## 2018-06-22 NOTE — Progress Notes (Signed)
D/w pt about recent imaging and has f/u CT per outside clinic.  I'll defer.  She has some aches that are manageable with tylenol.  She felt better about the situation knowing about recent imaging, though going through the imaging was stressful.    She was on pravastatin prev.  She still has some aches in spite of stopping statin.  Likely pravastatin was not the issue, d/w pt.    Other imaging findings of potential clinical significance: Aortic Atherosclerosis (ICD10-I70.0). 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 Right lower lobectomy. Stable 2 mm right upper lobe nodule. 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  We talked about deferring her bone density test given all the recent imaging she had done.  Repeat bone density test would not be emergent at this point.  She agreed to defer bone density test for now.   Meds, vitals, and allergies reviewed.   ROS: Per HPI unless specifically indicated in ROS section   GEN: nad, alert and oriented HEENT: mucous membranes moist NECK: supple w/o LA CV: rrr.  PULM: ctab, no inc wob ABD: soft, +bs EXT: no edema

## 2018-06-22 NOTE — Patient Instructions (Addendum)
Recheck lipids in about 2 months.  Restart pravastatin.  Update me as needed.   Defer the bone density for now.  Take care.  Glad to see you.

## 2018-06-24 NOTE — Assessment & Plan Note (Addendum)
Likely reasonable to restart statin, d/w pt. if her aches are worse she will let me know.  We reviewed all of her recent imaging.  Okay for outpatient follow-up.  Recheck labs in about 2 months when back on statin.

## 2018-06-24 NOTE — Assessment & Plan Note (Signed)
See hyperlipidemia discussion.

## 2018-06-26 ENCOUNTER — Ambulatory Visit
Admission: RE | Admit: 2018-06-26 | Discharge: 2018-06-26 | Disposition: A | Discharge status: Home / Self Care | Payer: PPO | Source: Ambulatory Visit | Attending: Family Medicine | Admitting: Family Medicine

## 2018-06-26 DIAGNOSIS — Z1231 Encounter for screening mammogram for malignant neoplasm of breast: Secondary | ICD-10-CM | POA: Diagnosis not present

## 2018-07-27 ENCOUNTER — Other Ambulatory Visit: Payer: Self-pay | Admitting: Internal Medicine

## 2018-07-27 DIAGNOSIS — C73 Malignant neoplasm of thyroid gland: Secondary | ICD-10-CM

## 2018-07-27 DIAGNOSIS — C3431 Malignant neoplasm of lower lobe, right bronchus or lung: Secondary | ICD-10-CM

## 2018-08-08 DIAGNOSIS — Z961 Presence of intraocular lens: Secondary | ICD-10-CM | POA: Diagnosis not present

## 2018-08-23 ENCOUNTER — Other Ambulatory Visit (INDEPENDENT_AMBULATORY_CARE_PROVIDER_SITE_OTHER): Payer: PPO

## 2018-08-23 DIAGNOSIS — E785 Hyperlipidemia, unspecified: Secondary | ICD-10-CM | POA: Diagnosis not present

## 2018-08-23 DIAGNOSIS — M8580 Other specified disorders of bone density and structure, unspecified site: Secondary | ICD-10-CM | POA: Diagnosis not present

## 2018-08-23 LAB — LIPID PANEL
Cholesterol: 145 mg/dL (ref 0–200)
HDL: 43.9 mg/dL (ref >39.00)
LDL Cholesterol: 74 mg/dL (ref 0–99)
NonHDL: 101.01
Total CHOL/HDL Ratio: 3
Triglycerides: 136 mg/dL (ref 0.0–149.0)
VLDL: 27.2 mg/dL (ref 0.0–40.0)

## 2018-08-23 LAB — VITAMIN D 25 HYDROXY (VIT D DEFICIENCY, FRACTURES): VITD: 33.9 ng/mL (ref 30.00–100.00)

## 2018-09-11 DIAGNOSIS — H1131 Conjunctival hemorrhage, right eye: Secondary | ICD-10-CM | POA: Diagnosis not present

## 2018-10-01 ENCOUNTER — Telehealth: Payer: Self-pay | Admitting: *Deleted

## 2018-10-01 NOTE — Telephone Encounter (Signed)
[facility] form has been filled out and patient has been notified that this is ready to be picked up.

## 2018-10-01 NOTE — Telephone Encounter (Signed)
Phone call returned. Explained to her that Dr. Jacinto Reap did not need any labs prior to the visit. Patient asked if labs could still be drawn due to her h/o thyroid cancer.

## 2018-10-01 NOTE — Telephone Encounter (Signed)
-----   Message from Festus Holts sent at 10/01/2018  8:39 AM EST ----- Regarding: [facility] Lab Requisition Form Contact: [phone] Good morning,  Patient called to request a [facility] form so that she can have her labs drawn b4 her next visit. Patient is requesting a call when form is available so that she can pick it up from our office.    Thanks, Verdis Frederickson.

## 2018-10-02 DIAGNOSIS — C73 Malignant neoplasm of thyroid gland: Secondary | ICD-10-CM | POA: Diagnosis not present

## 2018-10-03 ENCOUNTER — Encounter: Payer: Self-pay | Admitting: Internal Medicine

## 2018-10-08 ENCOUNTER — Encounter: Payer: Self-pay | Admitting: Internal Medicine

## 2018-10-16 DIAGNOSIS — M2041 Other hammer toe(s) (acquired), right foot: Secondary | ICD-10-CM | POA: Diagnosis not present

## 2018-10-16 DIAGNOSIS — M2011 Hallux valgus (acquired), right foot: Secondary | ICD-10-CM | POA: Diagnosis not present

## 2018-10-16 DIAGNOSIS — G5793 Unspecified mononeuropathy of bilateral lower limbs: Secondary | ICD-10-CM | POA: Diagnosis not present

## 2018-10-18 ENCOUNTER — Ambulatory Visit
Admission: RE | Admit: 2018-10-18 | Discharge: 2018-10-18 | Disposition: A | Discharge status: Home / Self Care | Payer: PPO | Source: Ambulatory Visit | Attending: Internal Medicine | Admitting: Internal Medicine

## 2018-10-18 DIAGNOSIS — C73 Malignant neoplasm of thyroid gland: Secondary | ICD-10-CM | POA: Insufficient documentation

## 2018-10-18 DIAGNOSIS — C3431 Malignant neoplasm of lower lobe, right bronchus or lung: Secondary | ICD-10-CM

## 2018-10-18 DIAGNOSIS — Z85118 Personal history of other malignant neoplasm of bronchus and lung: Secondary | ICD-10-CM | POA: Diagnosis not present

## 2018-10-18 HISTORY — DX: Malignant neoplasm of unspecified part of right bronchus or lung: C34.91

## 2018-10-18 MED ORDER — IOPAMIDOL (ISOVUE-300) INJECTION 61%
75.0000 mL | Freq: Once | INTRAVENOUS | Status: AC | PRN
  Administered 2018-10-18: 60 mL via INTRAVENOUS

## 2018-10-19 ENCOUNTER — Encounter: Payer: Self-pay | Admitting: Internal Medicine

## 2018-10-19 ENCOUNTER — Inpatient Hospital Stay: Payer: PPO | Attending: Internal Medicine | Admitting: Internal Medicine

## 2018-10-19 ENCOUNTER — Other Ambulatory Visit: Payer: Self-pay

## 2018-10-19 VITALS — BP 127/76 | HR 75 | Temp 96.1°F | Resp 20 | Ht 64.0 in | Wt 123.0 lb

## 2018-10-19 DIAGNOSIS — Z79899 Other long term (current) drug therapy: Secondary | ICD-10-CM | POA: Diagnosis not present

## 2018-10-19 DIAGNOSIS — Z85118 Personal history of other malignant neoplasm of bronchus and lung: Secondary | ICD-10-CM | POA: Diagnosis not present

## 2018-10-19 DIAGNOSIS — C3431 Malignant neoplasm of lower lobe, right bronchus or lung: Secondary | ICD-10-CM

## 2018-10-19 DIAGNOSIS — Z90710 Acquired absence of both cervix and uterus: Secondary | ICD-10-CM | POA: Diagnosis not present

## 2018-10-19 DIAGNOSIS — Z902 Acquired absence of lung [part of]: Secondary | ICD-10-CM | POA: Insufficient documentation

## 2018-10-19 DIAGNOSIS — C73 Malignant neoplasm of thyroid gland: Secondary | ICD-10-CM

## 2018-10-19 DIAGNOSIS — Z85850 Personal history of malignant neoplasm of thyroid: Secondary | ICD-10-CM | POA: Diagnosis not present

## 2018-10-19 MED ORDER — LEVOTHYROXINE SODIUM 88 MCG PO TABS: 88.0000 ug | ORAL_TABLET | Freq: Every day | ORAL | 3 refills | Status: DC

## 2018-10-19 NOTE — Progress Notes (Signed)
[place] OFFICE PROGRESS NOTE  Patient Care Team: Tonia Ghent, MD as PCP - General (Family Medicine) Beverly Gust, MD as Consulting Physician (Otolaryngology) Nestor Lewandowsky, MD as Consulting Physician (Cardiothoracic Surgery) Cammie Sickle, MD as Consulting Physician (Internal Medicine) Birder Robson, MD as Consulting Physician (Ophthalmology) Clayburn Pert, MD as Consulting Physician (General Surgery)  Cancer Staging Thyroid cancer [facility]) Staging form: Thyroid - Papillary or Follicular (Under 45 years), AJCC 7th Edition - Clinical: Stage I (T1, N0, M0) - Signed by Forest Gleason, MD on 09/11/2015    Oncology History   # NOV 2016- RLL [place] ca T1N0 [incidental; Dr.Simonds; Bronc- s/p RLlobectomy; Dr.Oaks;Dec 2016]  # FEB 2017- 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 STAGE I; s/p Total Thyroidetcomy [Dr.Chapman];NO RAIU.  LOW RISK; but detectable thyroglobulin- on Synthroid  DIAGNOSIS: [ ]   STAGE:         ;GOALS:  CURRENT/MOST RECENT THERAPY [ ]       Thyroid cancer ([place])    Cancer of lower lobe of right lung ([place])   05/06/2016 Initial Diagnosis    Malignant neoplasm of lower lobe, right bronchus or lung ([facility])       INTERVAL HISTORY:  [name] 72 y.o.  female pleasant patient above history of stage I lung cancer and also stage I thyroid cancer currently on surveillance is here for follow-up today with results of her CAT scan.  Patient's states her intrascapular pain has improved.  Denies any headaches but denies any nausea vomiting.  No chest pain or shortness with cough.  She complains of mild to moderate fatigue.  Review of Systems  Constitutional: Positive for malaise/fatigue. Negative for chills, diaphoresis, fever and weight loss.  HENT: Negative for nosebleeds and sore throat.   Eyes: Negative for double vision.  Respiratory: Negative for cough,  hemoptysis, sputum production, shortness of breath and wheezing.   Cardiovascular: Negative for chest pain, palpitations, orthopnea and leg swelling.  Gastrointestinal: Negative for abdominal pain, blood in stool, constipation, diarrhea, heartburn, melena and vomiting.  Genitourinary: Negative for dysuria, frequency and urgency.  Musculoskeletal: Positive for back pain. Negative for joint pain.  Skin: Negative.  Negative for itching and rash.  Neurological: Positive for tingling. Negative for dizziness, focal weakness, weakness and headaches.  Endo/Heme/Allergies: Does not bruise/bleed easily.  Psychiatric/Behavioral: Negative for depression. The patient is not nervous/anxious and does not have insomnia.       PAST MEDICAL HISTORY :  Past Medical History:  Diagnosis Date  . Adenocarcinoma of right lung ([place]) 10/01/2015  . GERD (gastroesophageal reflux disease)   . Groin pain    Along superior/laterl R inguinal canal.  Could be due to hernia or old scar tissue.  prev with CT done w/o alarming findings.  Will treat episodically.  Use tylenol #3 prn.    . Hyperlipidemia   . Lipoma of thigh    Left  . Normal cardiac stress test 2014  . PONV (postoperative nausea and vomiting)    nausea after hysterectomy, and 2 days after lung surgery 10/2015  . Thyroid cancer ([place]) 12/2015  . Thyroid nodule     PAST SURGICAL HISTORY :   Past Surgical History:  Procedure Laterality Date  . ABDOMINAL HYSTERECTOMY  12-30-08  . APPENDECTOMY  11/23/2016   Dr. Adonis Huguenin  . BREAST EXCISIONAL BIOPSY Right 1977  . BREAST MASS EXCISION  1977   benign  . CARPAL TUNNEL RELEASE Right 1978  .  CATARACT EXTRACTION, BILATERAL Bilateral   . CHOLECYSTECTOMY  11/23/2016   Procedure: LAPAROSCOPIC CHOLECYSTECTOMY;  Surgeon: Clayburn Pert, MD;  Location: [facility] ORS;  Service: General;;  . COLONOSCOPY  2009  . DILATION AND CURETTAGE OF UTERUS  2001  . ELECTROMAGNETIC NAVIGATION BROCHOSCOPY N/A 08/25/2015   Procedure:  ELECTROMAGNETIC NAVIGATION BRONCHOSCOPY;  Surgeon: Flora Lipps, MD;  Location: [facility] ORS;  Service: Cardiopulmonary;  Laterality: N/A;  . ENDOBRONCHIAL ULTRASOUND N/A 08/25/2015   Procedure: ENDOBRONCHIAL ULTRASOUND;  Surgeon: Flora Lipps, MD;  Location: [facility] ORS;  Service: Cardiopulmonary;  Laterality: N/A;  . ESOPHAGOGASTRODUODENOSCOPY  2002, 11/15/07   Normal (Dr. Allyn Kenner)  . FACIAL COSMETIC SURGERY  01/13/10   mini facelift  . HERNIA REPAIR  1976  . THORACOTOMY/LOBECTOMY Right 10/27/2015   Procedure: THORACOTOMY/LOBECTOMY;  Surgeon: Nestor Lewandowsky, MD;  Location: [facility] ORS;  Service: Thoracic;  Laterality: Right;  . THYROIDECTOMY N/A 12/29/2015   Procedure: THYROIDECTOMY;  Surgeon: Beverly Gust, MD;  Location: [facility] ORS;  Service: ENT;  Laterality: N/A;  . TOTAL VAGINAL HYSTERECTOMY  12/30/08   for fibroid pain (Dr. Gertie Fey)    FAMILY HISTORY :   Family History  Problem Relation Age of Onset  . Transient ischemic attack Mother   . Hypertension Mother   . Stroke Mother        mini strokes  . Hypertension Sister   . Cancer Sister        ovarian  . Hypertension Sister   . Hypertension Sister   . Hypertension Sister   . Breast cancer Neg Hx   . Colon cancer Neg Hx     SOCIAL HISTORY:   Social History   Tobacco Use  . Smoking status: Never Smoker  . Smokeless tobacco: Never Used  Substance Use Topics  . Alcohol use: No    Alcohol/week: 0.0 standard drinks  . Drug use: No    ALLERGIES:  is allergic to atorvastatin; ciprofloxacin; epinephrine; metoprolol succinate; simvastatin; and sulfonamide derivatives.  MEDICATIONS:  Current Outpatient Medications  Medication Sig Dispense Refill  . Ascorbic Acid (VITAMIN C) 500 MG tablet Take 2,000 mg by mouth daily. Reported on 02/04/2016    . Biotin (PA BIOTIN) 1000 MCG tablet Take 1,000 mcg by mouth daily. Reported on 02/04/2016    . CALCIUM-MAG-VIT C-VIT D PO Take by mouth daily.    . Cholecalciferol (VITAMIN D-3) 1000 units CAPS Take 1  capsule by mouth daily with supper.     . Coenzyme Q10 200 MG capsule Take 200 mg by mouth daily.    . Folic Acid-Vit D3-UKG U54 (FA-VITAMIN B-6-VITAMIN B-12 PO) Take 1 tablet by mouth daily after breakfast. Reported on 02/04/2016    . levothyroxine (SYNTHROID, LEVOTHROID) 88 MCG tablet Take 1 tablet (88 mcg total) by mouth daily before breakfast. 90 tablet 3  . Lutein 20 MG CAPS Take 20 mg by mouth daily.    . Omega-3 Fatty Acids (SUPER OMEGA 3 EPA/DHA) 1000 MG CAPS Take 1,000 mg by mouth daily with supper.    Marland Kitchen omeprazole (PRILOSEC) 40 MG capsule Take 1 capsule (40 mg total) by mouth daily. 90 capsule 3  . Potassium Gluconate 595 MG CAPS Take 595 mg by mouth daily.     . pravastatin (PRAVACHOL) 40 MG tablet Take 1 tablet (40 mg total) by mouth daily.     No current facility-administered medications for this visit.     PHYSICAL EXAMINATION: ECOG PERFORMANCE STATUS: 1 - Symptomatic but completely ambulatory  BP 127/76 (BP Location: Left Arm, Patient Position:  Sitting)   Pulse 75   Temp (!) 96.1 F (35.6 C) (Tympanic)   Resp 20   Ht 5\' 4"  (1.626 m)   Wt 123 lb (55.8 kg)   BMI 21.11 kg/m   Filed Weights   10/19/18 1000  Weight: 123 lb (55.8 kg)    Physical Exam  Constitutional: She is oriented to person, place, and time and well-developed, well-nourished, and in no distress.  HENT:  Head: Normocephalic and atraumatic.  Mouth/Throat: Oropharynx is clear and moist. No oropharyngeal exudate.  Eyes: Pupils are equal, round, and reactive to light.  Neck: Normal range of motion. Neck supple.  Cardiovascular: Normal rate and regular rhythm.  Pulmonary/Chest: No respiratory distress. She has no wheezes.  Abdominal: Soft. Bowel sounds are normal. She exhibits no distension and no mass. There is no abdominal tenderness. There is no rebound and no guarding.  Musculoskeletal: Normal range of motion.        General: No tenderness or edema.  Neurological: She is alert and oriented to person,  place, and time.  Skin: Skin is warm.  Psychiatric: Affect normal.      LABORATORY DATA:  I have reviewed the data as listed    Component Value Date/Time   NA 139 10/02/2017 1627   NA 141 07/01/2013 0103   K 4.0 10/02/2017 1627   K 3.8 07/01/2013 0103   CL 103 10/02/2017 1627   CL 110 (H) 07/01/2013 0103   CO2 31 10/02/2017 1627   CO2 27 07/01/2013 0103   GLUCOSE 99 10/02/2017 1627   GLUCOSE 88 07/01/2013 0103   BUN 12 10/02/2017 1627   BUN 11 07/01/2013 0103   CREATININE 0.80 06/05/2018 1520   CREATININE 0.68 07/01/2013 0103   CALCIUM 9.3 10/02/2017 1627   CALCIUM 8.2 (L) 07/01/2013 0103   PROT 6.5 10/02/2017 1627   PROT 5.5 (L) 07/01/2013 0103   ALBUMIN 4.3 10/02/2017 1627   ALBUMIN 3.0 (L) 07/01/2013 0103   AST 17 10/02/2017 1627   AST 19 07/01/2013 0103   ALT 16 10/02/2017 1627   ALT 24 07/01/2013 0103   ALKPHOS 39 10/02/2017 1627   ALKPHOS 55 07/01/2013 0103   BILITOT 0.4 10/02/2017 1627   BILITOT 0.7 07/01/2013 0103   GFRNONAA >60 03/20/2017 1251   GFRNONAA >60 07/01/2013 0103   GFRAA >60 03/20/2017 1251   GFRAA >60 07/01/2013 0103    No results found for: SPEP, UPEP  Lab Results  Component Value Date   WBC 6.1 10/02/2017   NEUTROABS 3.4 10/02/2017   HGB 13.3 10/02/2017   HCT 39.6 10/02/2017   MCV 92.3 10/02/2017   PLT 133.0 (L) 10/02/2017      Chemistry      Component Value Date/Time   NA 139 10/02/2017 1627   NA 141 07/01/2013 0103   K 4.0 10/02/2017 1627   K 3.8 07/01/2013 0103   CL 103 10/02/2017 1627   CL 110 (H) 07/01/2013 0103   CO2 31 10/02/2017 1627   CO2 27 07/01/2013 0103   BUN 12 10/02/2017 1627   BUN 11 07/01/2013 0103   CREATININE 0.80 06/05/2018 1520   CREATININE 0.68 07/01/2013 0103      Component Value Date/Time   CALCIUM 9.3 10/02/2017 1627   CALCIUM 8.2 (L) 07/01/2013 0103   ALKPHOS 39 10/02/2017 1627   ALKPHOS 55 07/01/2013 0103   AST 17 10/02/2017 1627   AST 19 07/01/2013 0103   ALT 16 10/02/2017 1627   ALT 24  07/01/2013 0103  BILITOT 0.4 10/02/2017 1627   BILITOT 0.7 07/01/2013 0103       RADIOGRAPHIC STUDIES: I have personally reviewed the radiological images as listed and agreed with the findings in the report. Ct Chest W Contrast  Result Date: 10/18/2018 CLINICAL DATA:  Right lower lobe adenocarcinoma, status post right lower lobectomy. EXAM: CT CHEST WITH CONTRAST TECHNIQUE: Multidetector CT imaging of the chest was performed during intravenous contrast administration. CONTRAST:  72mL ISOVUE-300 IOPAMIDOL (ISOVUE-300) INJECTION 61% COMPARISON:  PET-CT 06/13/2018.  Chest CT 06/07/2018. FINDINGS: Cardiovascular: The heart size is normal. No substantial pericardial effusion. Mediastinum/Nodes: No mediastinal lymphadenopathy. There is no hilar lymphadenopathy. The esophagus has normal imaging features. There is no axillary lymphadenopathy. Lungs/Pleura: The central tracheobronchial airways are patent. Volume loss right hemithorax compatible with prior right lower lobectomy. Tiny right upper lobe nodule (28/3) is stable in the interval. Anterior left upper lobe calcified granuloma is similar to prior. No new suspicious pulmonary nodule or mass. No focal airspace consolidation. No pleural effusion. Upper Abdomen: 8 mm low-density lesion posterior right liver identified previously is unchanged at 8 mm today. Other subtle small hypodensities in the posterior right liver are stable. Musculoskeletal: Stable appearance of the probable hemangioma at T3. Tiny left T10 lucency (106/2) is also unchanged. No change in the right T11 pedicle showing the tiny subtle sclerotic focus. No new bony abnormality. IMPRESSION: 1. Stable.  No new or progressive interval findings. 2. No change in the low-density tiny posterior right liver lesions. 3. Stable dominant thoracic spine lesion at T3, likely hemangioma. The other tiny lesions at T10 and T11 are stable. Electronically Signed   By: Misty Stanley M.D.   On: 10/18/2018 13:03      ASSESSMENT & PLAN:  Cancer of lower lobe of right lung ([place]) # RLL Lung ca; Stage I;  no adjuvant therapy. DEC 19th CT-subcentimeter lung nodule/liver -hemangioma stable; thoracic spine hemangioma stable.  #Continue surveillance; plan imaging again in 6 months.  Will order at next visit.  # Thyroid cancer status post thyroidectomy low risk stage I; on Synthroid 75 mcg. TSH 6.5; recommend increasing synthroid 52mcg once a day.  Negative thyroglobulin; positive antibody.  Monitor for now.  Reviewed the labs  # Peripheral neuropathy- ? Etiology. Defer to PCP.   # abdominal bloating status post gallbladder surgery-chronic stable.  Colestipol.  # BMD June 2019- T score= 2.2; Osteopenia/ hypocalcemia- vit D+ ca BID; exercise.  Discussed the potential risk factors; and treatment options.  Awaiting BMD in 2020 with  PCP.  #  DISPOSITION:  # follow up in 3 months-MD [[facility]- thyroid profile- 1 week prior]- Dr.B  # I reviewed the blood work- with the patient in detail; also reviewed the imaging independently [as summarized above]; and with the patient in detail.   Cc; Dr.Duncan.       No orders of the defined types were placed in this encounter.  All questions were answered. The patient knows to call the clinic with any problems, questions or concerns.      Cammie Sickle, MD 10/19/2018 10:49 AM

## 2018-10-19 NOTE — Assessment & Plan Note (Addendum)
#   RLL Lung ca; Stage I;  no adjuvant therapy. DEC 19th CT-subcentimeter lung nodule/liver -hemangioma stable; thoracic spine hemangioma stable.  #Continue surveillance; plan imaging again in 6 months.  Will order at next visit.  # Thyroid cancer status post thyroidectomy low risk stage I; on Synthroid 75 mcg. TSH 6.5; recommend increasing synthroid 18mcg once a day.  Negative thyroglobulin; positive antibody.  Monitor for now.  Reviewed the labs  # Peripheral neuropathy- ? Etiology. Defer to PCP.   # abdominal bloating status post gallbladder surgery-chronic stable.  Colestipol.  # BMD June 2019- T score= 2.2; Osteopenia/ hypocalcemia- vit D+ ca BID; exercise.  Discussed the potential risk factors; and treatment options.  Awaiting BMD in 2020 with  PCP.  #  DISPOSITION:  # follow up in 3 months-MD [[facility]- thyroid profile- 1 week prior]- Dr.B  # I reviewed the blood work- with the patient in detail; also reviewed the imaging independently [as summarized above]; and with the patient in detail.   Cc; Dr.Duncan.

## 2018-12-01 DIAGNOSIS — R300 Dysuria: Secondary | ICD-10-CM | POA: Diagnosis not present

## 2018-12-01 DIAGNOSIS — N390 Urinary tract infection, site not specified: Secondary | ICD-10-CM | POA: Diagnosis not present

## 2018-12-01 DIAGNOSIS — A499 Bacterial infection, unspecified: Secondary | ICD-10-CM | POA: Diagnosis not present

## 2018-12-16 ENCOUNTER — Other Ambulatory Visit: Payer: Self-pay | Admitting: Family Medicine

## 2019-01-05 ENCOUNTER — Emergency Department: Payer: PPO

## 2019-01-05 ENCOUNTER — Emergency Department
Admission: EM | Admit: 2019-01-05 | Discharge: 2019-01-05 | Disposition: A | Discharge status: Home / Self Care | Payer: PPO | Attending: Emergency Medicine | Admitting: Emergency Medicine

## 2019-01-05 DIAGNOSIS — I10 Essential (primary) hypertension: Secondary | ICD-10-CM | POA: Diagnosis not present

## 2019-01-05 DIAGNOSIS — R0789 Other chest pain: Secondary | ICD-10-CM

## 2019-01-05 DIAGNOSIS — Z79899 Other long term (current) drug therapy: Secondary | ICD-10-CM | POA: Diagnosis not present

## 2019-01-05 DIAGNOSIS — R079 Chest pain, unspecified: Secondary | ICD-10-CM | POA: Diagnosis not present

## 2019-01-05 LAB — BASIC METABOLIC PANEL
Anion gap: 8 (ref 5–15)
BUN: 18 mg/dL (ref 8–23)
CO2: 28 mmol/L (ref 22–32)
Calcium: 9 mg/dL (ref 8.9–10.3)
Chloride: 104 mmol/L (ref 98–111)
Creatinine, Ser: 0.68 mg/dL (ref 0.44–1.00)
GFR calc Af Amer: >60 mL/min (ref >60)
GFR calc non Af Amer: >60 mL/min (ref >60)
Glucose, Bld: 132 mg/dL — ABNORMAL HIGH (ref 70–99)
Potassium: 4.1 mmol/L (ref 3.5–5.1)
Sodium: 140 mmol/L (ref 135–145)

## 2019-01-05 LAB — CBC
HCT: 40.8 % (ref 36.0–46.0)
Hemoglobin: 12.9 g/dL (ref 12.0–15.0)
MCH: 29.6 pg (ref 26.0–34.0)
MCHC: 31.6 g/dL (ref 30.0–36.0)
MCV: 93.6 fL (ref 80.0–100.0)
Platelets: 146 10*3/uL — ABNORMAL LOW (ref 150–400)
RBC: 4.36 MIL/uL (ref 3.87–5.11)
RDW: 14 % (ref 11.5–15.5)
WBC: 5.7 10*3/uL (ref 4.0–10.5)
nRBC: 0 % (ref 0.0–0.2)

## 2019-01-05 LAB — TROPONIN I
Troponin I: <0.03 ng/mL (ref <0.03)
Troponin I: <0.03 ng/mL (ref <0.03)

## 2019-01-05 MED ORDER — SODIUM CHLORIDE 0.9% FLUSH: 3.0000 mL | Freq: Once | INTRAVENOUS | Status: DC

## 2019-01-05 NOTE — Discharge Instructions (Addendum)
Return to the ER for new, worsening, or persistent chest pain, difficulty breathing, weakness or lightheadedness, or any other new or worsening symptoms that concern you.  Follow-up with your regular doctor.

## 2019-01-05 NOTE — ED Triage Notes (Signed)
Pt presents via EMS c/o chest pain across entire chest. EMS reports pain worsening with moving onto stretcher. Reports pain improved at this time.

## 2019-01-05 NOTE — ED Provider Notes (Signed)
[facility] Emergency Department Provider Note ____________________________________________   First MD Initiated Contact with Patient 01/05/19 1228     (approximate)  I have reviewed the triage vital signs and the nursing notes.   HISTORY  Chief Complaint Chest Pain    HPI [name] is a 73 y.o. female with PMH as noted below who presents with chest pain, acute onset this morning around 10 AM when she was moving a carpet and leaned forward, described as bandlike pain across both sides of her lower anterior chest.  She denies associated shortness of breath, lightheadedness, or nausea.  The patient states that the pain has now mostly subsided.  She denies any prior history of similar pain although she states that she was admitted overnight in 2018 for chest pain.  Past Medical History:  Diagnosis Date  . Adenocarcinoma of right lung ([place]) 10/01/2015  . GERD (gastroesophageal reflux disease)   . Groin pain    Along superior/laterl R inguinal canal.  Could be due to hernia or old scar tissue.  prev with CT done w/o alarming findings.  Will treat episodically.  Use tylenol #3 prn.    . Hyperlipidemia   . Lipoma of thigh    Left  . Normal cardiac stress test 2014  . PONV (postoperative nausea and vomiting)    nausea after hysterectomy, and 2 days after lung surgery 10/2015  . Thyroid cancer ([place]) 12/2015  . Thyroid nodule     Patient Active Problem List   Diagnosis Date Noted  . Upper back pain 04/14/2018  . Aortic atherosclerosis ([place]) 05/31/2017  . Breast pain 03/25/2017  . Cancer of lower lobe of right lung ([place]) 05/06/2016  . Thyroid cancer ([place]) 09/11/2015  . Adenopathy   . Advance care planning 05/14/2014  . Thrombocytopenia ([place]) 07/03/2013  . Osteopenia 05/05/2013  . Medicare annual wellness visit, subsequent 04/26/2012  . GERD (gastroesophageal reflux disease) 04/26/2012  . Groin pain 01/12/2012  . Abdominal pain 01/12/2012  .  Essential hypertension 11/17/2010  . HLD (hyperlipidemia) 01/31/2008  . 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 ABDOMINAL HYSTERECTOMY  12-30-08  . APPENDECTOMY  11/23/2016   Dr. Adonis Huguenin  . BREAST EXCISIONAL BIOPSY Right 1977  . BREAST MASS EXCISION  1977   benign  . CARPAL TUNNEL RELEASE Right 1978  . CATARACT EXTRACTION, BILATERAL Bilateral   . CHOLECYSTECTOMY  11/23/2016   Procedure: LAPAROSCOPIC CHOLECYSTECTOMY;  Surgeon: Clayburn Pert, MD;  Location: [facility] ORS;  Service: General;;  . COLONOSCOPY  2009  . DILATION AND CURETTAGE OF UTERUS  2001  . ELECTROMAGNETIC NAVIGATION BROCHOSCOPY N/A 08/25/2015   Procedure: ELECTROMAGNETIC NAVIGATION BRONCHOSCOPY;  Surgeon: Flora Lipps, MD;  Location: [facility] ORS;  Service: Cardiopulmonary;  Laterality: N/A;  . ENDOBRONCHIAL ULTRASOUND N/A 08/25/2015   Procedure: ENDOBRONCHIAL ULTRASOUND;  Surgeon: Flora Lipps, MD;  Location: [facility] ORS;  Service: Cardiopulmonary;  Laterality: N/A;  . ESOPHAGOGASTRODUODENOSCOPY  2002, 11/15/07   Normal (Dr. Allyn Kenner)  . FACIAL COSMETIC SURGERY  01/13/10   mini facelift  . HERNIA REPAIR  1976  . THORACOTOMY/LOBECTOMY Right 10/27/2015   Procedure: THORACOTOMY/LOBECTOMY;  Surgeon: Nestor Lewandowsky, MD;  Location: [facility] ORS;  Service: Thoracic;  Laterality: Right;  . THYROIDECTOMY N/A 12/29/2015   Procedure: THYROIDECTOMY;  Surgeon: Beverly Gust, MD;  Location: [facility] ORS;  Service: ENT;  Laterality: N/A;  . TOTAL VAGINAL HYSTERECTOMY  12/30/08   for fibroid pain (Dr. Gertie Fey)    Prior  to Admission medications   Medication Sig Start Date End Date Taking? Authorizing Provider  Ascorbic Acid (VITAMIN C) 500 MG tablet Take 2,000 mg by mouth daily. Reported on 02/04/2016    [provider]  Biotin (PA BIOTIN) 1000 MCG tablet Take 1,000 mcg by mouth daily. Reported on 02/04/2016    [provider]  CALCIUM-MAG-VIT C-VIT D PO Take by mouth daily.     [provider]  Cholecalciferol (VITAMIN D-3) 1000 units CAPS Take 1 capsule by mouth daily with supper.     [provider]  Coenzyme Q10 200 MG capsule Take 200 mg by mouth daily.    [provider]  Folic Acid-Vit T4-HDQ Q22 (FA-VITAMIN B-6-VITAMIN B-12 PO) Take 1 tablet by mouth daily after breakfast. Reported on 02/04/2016    [provider]  levothyroxine (SYNTHROID, LEVOTHROID) 88 MCG tablet Take 1 tablet (88 mcg total) by mouth daily before breakfast. 10/19/18   Cammie Sickle, MD  Lutein 20 MG CAPS Take 20 mg by mouth daily.    [provider]  Omega-3 Fatty Acids (SUPER OMEGA 3 EPA/DHA) 1000 MG CAPS Take 1,000 mg by mouth daily with supper.    [provider]  omeprazole (PRILOSEC) 40 MG capsule TAKE 1 CAPSULE BY MOUTH EVERY DAY 12/17/18   Tonia Ghent, MD  Potassium Gluconate 595 MG CAPS Take 595 mg by mouth daily.     [provider]  pravastatin (PRAVACHOL) 40 MG tablet Take 1 tablet (40 mg total) by mouth daily. 06/22/18   Tonia Ghent, MD  calcium gluconate 500 MG tablet Take 500 mg by mouth daily.    01/10/12  [provider]  metoprolol succinate (TOPROL-XL) 25 MG 24 hr tablet Take 25 mg by mouth at bedtime.    01/10/12  [provider]  simvastatin (ZOCOR) 40 MG tablet Take 40 mg by mouth at bedtime.    01/10/12  [provider]    Allergies Atorvastatin; Ciprofloxacin; Epinephrine; Metoprolol succinate; Simvastatin; and Sulfonamide derivatives  Family History  Problem Relation Age of Onset  . Transient ischemic attack Mother   . Hypertension Mother   . Stroke Mother        mini strokes  . Hypertension Sister   . Cancer Sister        ovarian  . Hypertension Sister   . Hypertension Sister   . Hypertension Sister   . Breast cancer Neg Hx   . Colon cancer Neg Hx     Social History Social History   Tobacco Use  . Smoking status: Never Smoker  . Smokeless tobacco:  Never Used  Substance Use Topics  . Alcohol use: No    Alcohol/week: 0.0 standard drinks  . Drug use: No    Review of Systems  Constitutional: No fever. Eyes: No redness. ENT: No neck pain. Cardiovascular: Positive for chest pain. Respiratory: Denies shortness of breath. Gastrointestinal: No nausea. Genitourinary: Negative for flank pain.  Musculoskeletal: Negative for back pain. Skin: Negative for rash. Neurological: Negative for headache.   ____________________________________________   PHYSICAL EXAM:  VITAL SIGNS: ED Triage Vitals  Enc Vitals Group     BP 01/05/19 1224 (!) 152/75     Pulse Rate 01/05/19 1224 84     Resp 01/05/19 1224 14     Temp 01/05/19 1224 97.8 F (36.6 C)     Temp Source 01/05/19 1224 Oral     SpO2 01/05/19 1224 98 %     Weight 01/05/19 1225  125 lb (56.7 kg)     Height --      Head Circumference --      Peak Flow --      Pain Score 01/05/19 1225 4     Pain Loc --      Pain Edu? --      Excl. in [place]? --     Constitutional: Alert and oriented. Well appearing and in no acute distress. Eyes: Conjunctivae are normal.  Head: Atraumatic. Nose: No congestion/rhinnorhea. Mouth/Throat: Mucous membranes are moist.   Neck: Normal range of motion.  Cardiovascular: Normal rate, regular rhythm. Grossly normal heart sounds.  Good peripheral circulation. Respiratory: Normal respiratory effort.  No retractions. Lungs CTAB. Gastrointestinal: No distention.  Musculoskeletal: No lower extremity edema.  Extremities warm and well perfused.  Neurologic:  Normal speech and language. No gross focal neurologic deficits are appreciated.  Skin:  Skin is warm and dry. No rash noted. Psychiatric: Mood and affect are normal. Speech and behavior are normal.  ____________________________________________   LABS (all labs ordered are listed, but only abnormal results are displayed)  Labs Reviewed  BASIC METABOLIC PANEL - Abnormal; Notable for the following  components:      Result Value   Glucose, Bld 132 (*)    All other components within normal limits  CBC - Abnormal; Notable for the following components:   Platelets 146 (*)    All other components within normal limits  TROPONIN I  TROPONIN I   ____________________________________________  EKG  ED ECG REPORT I, Arta Silence, the attending physician, personally viewed and interpreted this ECG.  Date: 01/05/2019 EKG Time: 1227 Rate: 86 Rhythm: normal sinus rhythm QRS Axis: normal Intervals: normal ST/T Wave abnormalities: normal Narrative Interpretation: no evidence of acute ischemia  ____________________________________________  RADIOLOGY  CXR: No acute abnormality  ____________________________________________   PROCEDURES  Procedure(s) performed: No  Procedures  Critical Care performed: No ____________________________________________   INITIAL IMPRESSION / ASSESSMENT AND PLAN / ED COURSE  Pertinent labs & imaging results that were available during my care of the patient were reviewed by me and considered in my medical decision making (see chart for details).  73 year old female with PMH as noted above presents with atypical chest pain that began when she leaned forward while moving a rug around, with no concerning associated symptoms.  Has now mostly subsided.  I reviewed the past medical records in [place].  The patient was admitted for chest pain and ruled out for ACS in May 2018, with negative stress test.  On exam today the patient is well-appearing and her vital signs are normal except for hypertension.  The remainder of the exam is unremarkable.  EKG is nonischemic.  Overall presentation is most consistent with musculoskeletal pain, versus possible GERD.  I have a low suspicion for ACS.  There is no clinical evidence to support PE or aortic dissection or other vascular etiology.  We will obtain cardiac enzymes x2 and basic labs as well as a chest x-ray.   I anticipate that if the work-up is entirely negative and the pain continues to improve, she likely will be safe for discharge home.  ----------------------------------------- 3:16 PM on 01/05/2019 -----------------------------------------  Pain has resolved.  Initial and repeat troponin are both negative and the remainder of the work-up is unremarkable.  The patient is stable for discharge at this time.  I counseled her on the results of the work-up.  Return precautions given, and she expresses understanding. ____________________________________________   FINAL CLINICAL IMPRESSION(S) /  ED DIAGNOSES  Final diagnoses:  Atypical chest pain      NEW MEDICATIONS STARTED DURING THIS VISIT:  New Prescriptions   No medications on file     Note:  This document was prepared using Dragon voice recognition software and may include unintentional dictation errors.    Arta Silence, MD 01/05/19 1517

## 2019-01-10 DIAGNOSIS — Z7689 Persons encountering health services in other specified circumstances: Secondary | ICD-10-CM | POA: Diagnosis not present

## 2019-01-15 ENCOUNTER — Encounter: Payer: Self-pay | Admitting: Internal Medicine

## 2019-01-18 ENCOUNTER — Ambulatory Visit: Payer: PPO | Admitting: Internal Medicine

## 2019-01-21 ENCOUNTER — Telehealth: Payer: Self-pay | Admitting: Internal Medicine

## 2019-01-21 ENCOUNTER — Telehealth: Payer: Self-pay | Admitting: *Deleted

## 2019-01-21 NOTE — Telephone Encounter (Signed)
Patient reports that she had labs at [place] and her follow up appointment got cancelled and she would like to know her results. Please return her call

## 2019-01-21 NOTE — Telephone Encounter (Signed)
Spoke to pt re: results of the Thyroid testing.  Recommend follow up in 6 months-MD/ cbc/cmp/thyroid profile/ Thyroglobulin levels.  Cancel April appt.   Thanks.  GB

## 2019-02-20 ENCOUNTER — Ambulatory Visit: Payer: PPO | Admitting: Internal Medicine

## 2019-04-02 ENCOUNTER — Encounter: Payer: Self-pay | Admitting: Internal Medicine

## 2019-05-16 ENCOUNTER — Encounter: Payer: Self-pay | Admitting: Emergency Medicine

## 2019-05-16 ENCOUNTER — Inpatient Hospital Stay
Admission: EM | Admit: 2019-05-16 | Discharge: 2019-05-19 | DRG: 379 | Disposition: A | Discharge status: Home / Self Care | Payer: PPO | Attending: Internal Medicine | Admitting: Internal Medicine

## 2019-05-16 ENCOUNTER — Other Ambulatory Visit: Payer: Self-pay

## 2019-05-16 DIAGNOSIS — Z882 Allergy status to sulfonamides status: Secondary | ICD-10-CM

## 2019-05-16 DIAGNOSIS — Z1159 Encounter for screening for other viral diseases: Secondary | ICD-10-CM | POA: Diagnosis not present

## 2019-05-16 DIAGNOSIS — Z85850 Personal history of malignant neoplasm of thyroid: Secondary | ICD-10-CM

## 2019-05-16 DIAGNOSIS — E785 Hyperlipidemia, unspecified: Secondary | ICD-10-CM | POA: Diagnosis present

## 2019-05-16 DIAGNOSIS — Z85118 Personal history of other malignant neoplasm of bronchus and lung: Secondary | ICD-10-CM | POA: Diagnosis not present

## 2019-05-16 DIAGNOSIS — K317 Polyp of stomach and duodenum: Secondary | ICD-10-CM | POA: Diagnosis present

## 2019-05-16 DIAGNOSIS — K922 Gastrointestinal hemorrhage, unspecified: Secondary | ICD-10-CM | POA: Diagnosis not present

## 2019-05-16 DIAGNOSIS — E890 Postprocedural hypothyroidism: Secondary | ICD-10-CM | POA: Diagnosis present

## 2019-05-16 DIAGNOSIS — E039 Hypothyroidism, unspecified: Secondary | ICD-10-CM | POA: Diagnosis not present

## 2019-05-16 DIAGNOSIS — K921 Melena: Secondary | ICD-10-CM | POA: Diagnosis present

## 2019-05-16 DIAGNOSIS — Z8249 Family history of ischemic heart disease and other diseases of the circulatory system: Secondary | ICD-10-CM | POA: Diagnosis not present

## 2019-05-16 DIAGNOSIS — K219 Gastro-esophageal reflux disease without esophagitis: Secondary | ICD-10-CM | POA: Diagnosis present

## 2019-05-16 DIAGNOSIS — D126 Benign neoplasm of colon, unspecified: Secondary | ICD-10-CM | POA: Diagnosis not present

## 2019-05-16 DIAGNOSIS — K635 Polyp of colon: Secondary | ICD-10-CM | POA: Diagnosis present

## 2019-05-16 DIAGNOSIS — Z888 Allergy status to other drugs, medicaments and biological substances status: Secondary | ICD-10-CM | POA: Diagnosis not present

## 2019-05-16 DIAGNOSIS — I10 Essential (primary) hypertension: Secondary | ICD-10-CM | POA: Diagnosis present

## 2019-05-16 DIAGNOSIS — K625 Hemorrhage of anus and rectum: Secondary | ICD-10-CM | POA: Diagnosis not present

## 2019-05-16 DIAGNOSIS — Z79890 Hormone replacement therapy (postmenopausal): Secondary | ICD-10-CM

## 2019-05-16 DIAGNOSIS — Z8041 Family history of malignant neoplasm of ovary: Secondary | ICD-10-CM | POA: Diagnosis not present

## 2019-05-16 DIAGNOSIS — D122 Benign neoplasm of ascending colon: Secondary | ICD-10-CM | POA: Diagnosis not present

## 2019-05-16 DIAGNOSIS — Z881 Allergy status to other antibiotic agents status: Secondary | ICD-10-CM

## 2019-05-16 DIAGNOSIS — Z823 Family history of stroke: Secondary | ICD-10-CM | POA: Diagnosis not present

## 2019-05-16 DIAGNOSIS — Z03818 Encounter for observation for suspected exposure to other biological agents ruled out: Secondary | ICD-10-CM | POA: Diagnosis not present

## 2019-05-16 LAB — URINALYSIS, COMPLETE (UACMP) WITH MICROSCOPIC
Bacteria, UA: NONE SEEN
Bilirubin Urine: NEGATIVE
Glucose, UA: NEGATIVE mg/dL
Hgb urine dipstick: NEGATIVE
Ketones, ur: NEGATIVE mg/dL
Nitrite: NEGATIVE
Protein, ur: NEGATIVE mg/dL
Specific Gravity, Urine: 1.02 (ref 1.005–1.030)
Squamous Epithelial / HPF: NONE SEEN (ref 0–5)
pH: 5 (ref 5.0–8.0)

## 2019-05-16 LAB — COMPREHENSIVE METABOLIC PANEL
ALT: 21 U/L (ref 0–44)
AST: 19 U/L (ref 15–41)
Albumin: 4.1 g/dL (ref 3.5–5.0)
Alkaline Phosphatase: 57 U/L (ref 38–126)
Anion gap: 7 (ref 5–15)
BUN: 20 mg/dL (ref 8–23)
CO2: 28 mmol/L (ref 22–32)
Calcium: 9 mg/dL (ref 8.9–10.3)
Chloride: 103 mmol/L (ref 98–111)
Creatinine, Ser: 0.81 mg/dL (ref 0.44–1.00)
GFR calc Af Amer: >60 mL/min (ref >60)
GFR calc non Af Amer: >60 mL/min (ref >60)
Glucose, Bld: 151 mg/dL — ABNORMAL HIGH (ref 70–99)
Potassium: 4 mmol/L (ref 3.5–5.1)
Sodium: 138 mmol/L (ref 135–145)
Total Bilirubin: 0.7 mg/dL (ref 0.3–1.2)
Total Protein: 6.6 g/dL (ref 6.5–8.1)

## 2019-05-16 LAB — CBC WITH DIFFERENTIAL/PLATELET
Abs Immature Granulocytes: 0.04 10*3/uL (ref 0.00–0.07)
Basophils Absolute: 0.1 10*3/uL (ref 0.0–0.1)
Basophils Relative: 1 %
Eosinophils Absolute: 0.1 10*3/uL (ref 0.0–0.5)
Eosinophils Relative: 1 %
HCT: 38.7 % (ref 36.0–46.0)
Hemoglobin: 12.5 g/dL (ref 12.0–15.0)
Immature Granulocytes: 0 %
Lymphocytes Relative: 37 %
Lymphs Abs: 3.5 10*3/uL (ref 0.7–4.0)
MCH: 30.3 pg (ref 26.0–34.0)
MCHC: 32.3 g/dL (ref 30.0–36.0)
MCV: 93.7 fL (ref 80.0–100.0)
Monocytes Absolute: 0.7 10*3/uL (ref 0.1–1.0)
Monocytes Relative: 8 %
Neutro Abs: 5 10*3/uL (ref 1.7–7.7)
Neutrophils Relative %: 53 %
Platelets: 182 10*3/uL (ref 150–400)
RBC: 4.13 MIL/uL (ref 3.87–5.11)
RDW: 14.3 % (ref 11.5–15.5)
WBC: 9.5 10*3/uL (ref 4.0–10.5)
nRBC: 0 % (ref 0.0–0.2)

## 2019-05-16 LAB — LIPASE, BLOOD: Lipase: 36 U/L (ref 11–51)

## 2019-05-16 LAB — SARS CORONAVIRUS 2 BY RT PCR (HOSPITAL ORDER, PERFORMED IN ~~LOC~~ HOSPITAL LAB): SARS Coronavirus 2: NEGATIVE

## 2019-05-16 MED ORDER — PANTOPRAZOLE SODIUM 40 MG IV SOLR
40.0000 mg | Freq: Two times a day (BID) | INTRAVENOUS | Status: DC
  Administered 2019-05-17 – 2019-05-18 (×3): 40 mg via INTRAVENOUS
  Filled 2019-05-16 (×3): qty 40

## 2019-05-16 NOTE — ED Triage Notes (Signed)
Patient ambulatory to triage with steady gait, without difficulty or distress noted, mask in place; pt reports bloody diarrhea with lower abd/back pain this evening

## 2019-05-16 NOTE — ED Notes (Signed)
PT c/o pain in between shoulder blades, pain coming up through left breast, and pelvic cramping

## 2019-05-16 NOTE — H&P (Signed)
[place] at [place] NAME: [name]    MR#:  [id number]  DATE OF BIRTH:  12-08-1945  DATE OF ADMISSION:  05/16/2019  PRIMARY CARE PHYSICIAN: Tonia Ghent, MD   REQUESTING/REFERRING PHYSICIAN: Harvest Dark, MD  CHIEF COMPLAINT:   Chief Complaint  Patient presents with  . Abdominal Pain    HISTORY OF PRESENT ILLNESS:  [name]  is a 73 y.o. female with a known history of GERD, right lung cancer status post surgical excision not requiring chemotherapy or radiation therapy, hyperlipidemia, history of thyroid cancer status post surgical removal.  She presented to the emergency room complaining of dark black appearing stool which began around 6:30 PM.  She noted feeling as though she had "an upset stomach "with diarrhea/black stool when she went to the restroom.  She also noted some small amount of bright red blood in addition to the black stool.  She continued to have 5 additional diarrhea black stools after that point and therefore presented to the emergency room for evaluation.  She has not experienced nausea or vomiting.  She reports faint sharp abdominal pain initially prior to the first stool however, has not experienced any abdominal pain since that.  She has no prior history of GI bleed.  However she does have a history of hemorrhoids.  Her last EGD/colonoscopy was in 2009.  She denies symptoms of chest pain, shortness of breath, fever, chills, nausea, vomiting, cough.  Hemoglobin is 12.5 with hematocrit 38.7 on arrival with platelet count 182.  Hematochezia was noted on rectal exam in the emergency room.  She has been admitted to the hospitalist service for further management.  PAST MEDICAL HISTORY:   Past Medical History:  Diagnosis Date  . Adenocarcinoma of right lung ([place]) 10/01/2015  . GERD (gastroesophageal reflux disease)   . Groin pain    Along superior/laterl R inguinal canal.  Could be due to hernia or old  scar tissue.  prev with CT done w/o alarming findings.  Will treat episodically.  Use tylenol #3 prn.    . Hyperlipidemia   . Lipoma of thigh    Left  . Normal cardiac stress test 2014  . PONV (postoperative nausea and vomiting)    nausea after hysterectomy, and 2 days after lung surgery 10/2015  . Thyroid cancer ([place]) 12/2015  . Thyroid nodule     PAST SURGICAL HISTORY:   Past Surgical History:  Procedure Laterality Date  . ABDOMINAL HYSTERECTOMY  12-30-08  . APPENDECTOMY  11/23/2016   Dr. Adonis Huguenin  . BREAST EXCISIONAL BIOPSY Right 1977  . BREAST MASS EXCISION  1977   benign  . CARPAL TUNNEL RELEASE Right 1978  . CATARACT EXTRACTION, BILATERAL Bilateral   . CHOLECYSTECTOMY  11/23/2016   Procedure: LAPAROSCOPIC CHOLECYSTECTOMY;  Surgeon: Clayburn Pert, MD;  Location: [facility] ORS;  Service: General;;  . COLONOSCOPY  2009  . DILATION AND CURETTAGE OF UTERUS  2001  . ELECTROMAGNETIC NAVIGATION BROCHOSCOPY N/A 08/25/2015   Procedure: ELECTROMAGNETIC NAVIGATION BRONCHOSCOPY;  Surgeon: Flora Lipps, MD;  Location: [facility] ORS;  Service: Cardiopulmonary;  Laterality: N/A;  . ENDOBRONCHIAL ULTRASOUND N/A 08/25/2015   Procedure: ENDOBRONCHIAL ULTRASOUND;  Surgeon: Flora Lipps, MD;  Location: [facility] ORS;  Service: Cardiopulmonary;  Laterality: N/A;  . ESOPHAGOGASTRODUODENOSCOPY  2002, 11/15/07   Normal (Dr. Allyn Kenner)  . FACIAL COSMETIC SURGERY  01/13/10   mini facelift  . HERNIA REPAIR  1976  . THORACOTOMY/LOBECTOMY Right 10/27/2015   Procedure: THORACOTOMY/LOBECTOMY;  Surgeon:  Nestor Lewandowsky, MD;  Location: [facility] ORS;  Service: Thoracic;  Laterality: Right;  . THYROIDECTOMY N/A 12/29/2015   Procedure: THYROIDECTOMY;  Surgeon: Beverly Gust, MD;  Location: [facility] ORS;  Service: ENT;  Laterality: N/A;  . TOTAL VAGINAL HYSTERECTOMY  12/30/08   for fibroid pain (Dr. Gertie Fey)    SOCIAL HISTORY:   Social History   Tobacco Use  . Smoking status: Never Smoker  . Smokeless tobacco: Never Used  Substance  Use Topics  . Alcohol use: No    Alcohol/week: 0.0 standard drinks    FAMILY HISTORY:   Family History  Problem Relation Age of Onset  . Transient ischemic attack Mother   . Hypertension Mother   . Stroke Mother        mini strokes  . Hypertension Sister   . Cancer Sister        ovarian  . Hypertension Sister   . Hypertension Sister   . Hypertension Sister   . Breast cancer Neg Hx   . Colon cancer Neg Hx     DRUG ALLERGIES:   Allergies  Allergen Reactions  . Atorvastatin     REACTION: myalgias  . Ciprofloxacin     REACTION: increase reflux  . Epinephrine     Heart racing with dental injection  . Metoprolol Succinate Swelling    Swelling and stiffness in hands and ankles  . Simvastatin Other (See Comments)    Muscle aches and stiffness  . Sulfonamide Derivatives     REACTION: itching    REVIEW OF SYSTEMS:   Review of Systems  Constitutional: Negative for chills, fever and malaise/fatigue.  HENT: Negative for congestion, nosebleeds, sinus pain and sore throat.   Eyes: Negative for blurred vision and double vision.  Respiratory: Negative for cough, hemoptysis, sputum production, shortness of breath and wheezing.   Cardiovascular: Negative for chest pain and leg swelling.  Gastrointestinal: Positive for blood in stool (black stool with diarrhea) and diarrhea. Negative for abdominal pain, constipation, heartburn, nausea and vomiting.  Genitourinary: Negative for dysuria, flank pain and hematuria.  Musculoskeletal: Negative for back pain, falls, joint pain, myalgias and neck pain.  Skin: Negative for itching and rash.  Neurological: Negative for dizziness, weakness and headaches.  Psychiatric/Behavioral: Negative.  Negative for depression.     MEDICATIONS AT HOME:   Prior to Admission medications   Medication Sig Start Date End Date Taking? Authorizing Provider  Ascorbic Acid (VITAMIN C) 500 MG tablet Take 2,000 mg by mouth daily. Reported on 02/04/2016   Yes  [provider]  Biotin (PA BIOTIN) 1000 MCG tablet Take 1,000 mcg by mouth daily. Reported on 02/04/2016   Yes [provider]  CALCIUM-MAG-VIT C-VIT D PO Take by mouth daily.   Yes [provider]  Cholecalciferol (VITAMIN D-3) 1000 units CAPS Take 1 capsule by mouth daily with supper.    Yes [provider]  Coenzyme Q10 200 MG capsule Take 200 mg by mouth daily.   Yes [provider]  Folic Acid-Vit M0-QQP Y19 (FA-VITAMIN B-6-VITAMIN B-12 PO) Take 1 tablet by mouth daily after breakfast. Reported on 02/04/2016   Yes [provider]  levothyroxine (SYNTHROID, LEVOTHROID) 88 MCG tablet Take 1 tablet (88 mcg total) by mouth daily before breakfast. 10/19/18  Yes Cammie Sickle, MD  Lutein 20 MG CAPS Take 20 mg by mouth daily.   Yes [provider]  Omega-3 Fatty Acids (SUPER OMEGA 3 EPA/DHA) 1000 MG CAPS Take 1,000 mg by mouth daily with supper.  Yes [provider]  omeprazole (PRILOSEC) 40 MG capsule TAKE 1 CAPSULE BY MOUTH EVERY DAY 12/17/18  Yes Tonia Ghent, MD  Potassium Gluconate 595 MG CAPS Take 595 mg by mouth daily.    Yes [provider]  pravastatin (PRAVACHOL) 40 MG tablet Take 1 tablet (40 mg total) by mouth daily. 06/22/18  Yes Tonia Ghent, MD  Calcium Carbonate (CALCIUM 500 PO) Take 1 tablet by mouth every morning.   12/30/15  [provider]  calcium gluconate 500 MG tablet Take 500 mg by mouth daily.    01/10/12  [provider]  metoprolol succinate (TOPROL-XL) 25 MG 24 hr tablet Take 25 mg by mouth at bedtime.    01/10/12  [provider]  simvastatin (ZOCOR) 40 MG tablet Take 40 mg by mouth at bedtime.    01/10/12  [provider]      VITAL SIGNS:  Blood pressure (!) 151/79, pulse 89, temperature 98.8 F (37.1 C), temperature source Oral, resp. rate 18, height 5\' 5"  (1.651 m), weight 56.7 kg, SpO2 98 %.  PHYSICAL EXAMINATION:  Physical  Exam  GENERAL:  73 y.o.-year-old patient lying in the bed with no acute distress.  EYES: Pupils equal, round, reactive to light and accommodation. No scleral icterus. Extraocular muscles intact.  HEENT: Head atraumatic, normocephalic. Oropharynx and nasopharynx clear.  NECK:  Supple, no jugular venous distention. No thyroid enlargement, no tenderness.  LUNGS: Normal breath sounds bilaterally, no wheezing, rales,rhonchi or crepitation. No use of accessory muscles of respiration.  CARDIOVASCULAR: Regular rate and rhythm, S1, S2 normal. No murmurs, rubs, or gallops.  ABDOMEN: Soft, nondistended, nontender. Bowel sounds present. No organomegaly or mass.  EXTREMITIES: No pedal edema, cyanosis, or clubbing.  NEUROLOGIC: Cranial nerves II through XII are intact. Muscle strength 5/5 in all extremities. Sensation intact. Gait not checked.  PSYCHIATRIC: The patient is alert and oriented x 3.  Normal affect and good eye contact. SKIN: No obvious rash, lesion, or ulcer.   LABORATORY PANEL:   CBC Recent Labs  Lab 05/16/19 1950  WBC 9.5  HGB 12.5  HCT 38.7  PLT 182   ------------------------------------------------------------------------------------------------------------------  Chemistries  Recent Labs  Lab 05/16/19 1950  NA 138  K 4.0  CL 103  CO2 28  GLUCOSE 151*  BUN 20  CREATININE 0.81  CALCIUM 9.0  AST 19  ALT 21  ALKPHOS 57  BILITOT 0.7   ------------------------------------------------------------------------------------------------------------------  Cardiac Enzymes No results for input(s): TROPONINI in the last 168 hours. ------------------------------------------------------------------------------------------------------------------  RADIOLOGY:  No results found.    IMPRESSION AND PLAN:   1. GI bleed - Hemoglobin hematocrit every 6 hours - We will transfuse as necessary - Gastroenterology consulted for further evaluation and recommendations -Patient will  be held n.p.o. for possible endoscopy procedure in the a.m. - IV Protonix  2.  GERD - PPI therapy continued  3.  Acquired hypothyroidism - TSH pending -Continue Synthroid  4.  Hyperlipidemia - We will continue pravastatin.  DVT prophylaxis with SCDs and PPI prophylaxis initiated    All the records are reviewed and case discussed with ED provider. The plan of care was discussed in details with the patient (and family). I answered all questions. The patient agreed to proceed with the above mentioned plan. Further management will depend upon hospital course.   CODE STATUS: Full code  TOTAL TIME TAKING CARE OF THIS PATIENT:45 minutes.    [place] on 05/16/2019 at 11:16 PM  Pager - [phone]  After 6pm go to [email or web address] - password [email or web address] [facility]  [employer] [place] [facility]  Office  [phone]  CC: Primary care physician; Tonia Ghent, MD   Note: This dictation was prepared with Dragon dictation along with smaller phrase technology. Any transcriptional errors that result from this process are unintentional.

## 2019-05-16 NOTE — ED Provider Notes (Signed)
[facility] Emergency Department Provider Note  Time seen: 9:40 PM  I have reviewed the triage vital signs and the nursing notes.   HISTORY  Chief Complaint Dark stool  HPI [name] is a 73 y.o. female with a past medical history of gastric reflux, hyperlipidemia, presents to the emergency department for dark appearing stool.  According to the patient she felt she had an upset stomach tonight, states she had to go to the restroom and had diarrhea.  Notes that the diarrhea was extremely black in color and she thought she could see bright red blood in addition to the black stool.  Patient states she then proceeded to have multiple additional bowel movements she estimates approximately 6 in total.  Denies any nausea or vomiting.  Denies any pain in her abdomen besides feeling like she had an upset stomach which has since resolved.  Denies any GI bleed in the past although she does state hemorrhoids with occasional blood on the toilet paper.  Denies any fever cough congestion or shortness of breath.   Past Medical History:  Diagnosis Date  . Adenocarcinoma of right lung ([place]) 10/01/2015  . GERD (gastroesophageal reflux disease)   . Groin pain    Along superior/laterl R inguinal canal.  Could be due to hernia or old scar tissue.  prev with CT done w/o alarming findings.  Will treat episodically.  Use tylenol #3 prn.    . Hyperlipidemia   . Lipoma of thigh    Left  . Normal cardiac stress test 2014  . PONV (postoperative nausea and vomiting)    nausea after hysterectomy, and 2 days after lung surgery 10/2015  . Thyroid cancer ([place]) 12/2015  . Thyroid nodule     Patient Active Problem List   Diagnosis Date Noted  . Upper back pain 04/14/2018  . Aortic atherosclerosis ([place]) 05/31/2017  . Breast pain 03/25/2017  . Cancer of lower lobe of right lung ([place]) 05/06/2016  . Thyroid cancer ([place]) 09/11/2015  . Adenopathy   . Advance care planning 05/14/2014  .  Thrombocytopenia ([place]) 07/03/2013  . Osteopenia 05/05/2013  . Medicare annual wellness visit, subsequent 04/26/2012  . GERD (gastroesophageal reflux disease) 04/26/2012  . Groin pain 01/12/2012  . Abdominal pain 01/12/2012  . Essential hypertension 11/17/2010  . HLD (hyperlipidemia) 01/31/2008  . 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 ABDOMINAL HYSTERECTOMY  12-30-08  . APPENDECTOMY  11/23/2016   Dr. Adonis Huguenin  . BREAST EXCISIONAL BIOPSY Right 1977  . BREAST MASS EXCISION  1977   benign  . CARPAL TUNNEL RELEASE Right 1978  . CATARACT EXTRACTION, BILATERAL Bilateral   . CHOLECYSTECTOMY  11/23/2016   Procedure: LAPAROSCOPIC CHOLECYSTECTOMY;  Surgeon: Clayburn Pert, MD;  Location: [facility] ORS;  Service: General;;  . COLONOSCOPY  2009  . DILATION AND CURETTAGE OF UTERUS  2001  . ELECTROMAGNETIC NAVIGATION BROCHOSCOPY N/A 08/25/2015   Procedure: ELECTROMAGNETIC NAVIGATION BRONCHOSCOPY;  Surgeon: Flora Lipps, MD;  Location: [facility] ORS;  Service: Cardiopulmonary;  Laterality: N/A;  . ENDOBRONCHIAL ULTRASOUND N/A 08/25/2015   Procedure: ENDOBRONCHIAL ULTRASOUND;  Surgeon: Flora Lipps, MD;  Location: [facility] ORS;  Service: Cardiopulmonary;  Laterality: N/A;  . ESOPHAGOGASTRODUODENOSCOPY  2002, 11/15/07   Normal (Dr. Allyn Kenner)  . FACIAL COSMETIC SURGERY  01/13/10   mini facelift  . HERNIA REPAIR  1976  . THORACOTOMY/LOBECTOMY Right 10/27/2015   Procedure: THORACOTOMY/LOBECTOMY;  Surgeon: Nestor Lewandowsky, MD;  Location: [facility] ORS;  Service:  Thoracic;  Laterality: Right;  . THYROIDECTOMY N/A 12/29/2015   Procedure: THYROIDECTOMY;  Surgeon: Beverly Gust, MD;  Location: [facility] ORS;  Service: ENT;  Laterality: N/A;  . TOTAL VAGINAL HYSTERECTOMY  12/30/08   for fibroid pain (Dr. Gertie Fey)    Prior to Admission medications   Medication Sig Start Date End Date Taking? Authorizing Provider  Ascorbic Acid (VITAMIN C) 500 MG tablet Take 2,000 mg  by mouth daily. Reported on 02/04/2016    [provider]  Biotin (PA BIOTIN) 1000 MCG tablet Take 1,000 mcg by mouth daily. Reported on 02/04/2016    [provider]  CALCIUM-MAG-VIT C-VIT D PO Take by mouth daily.    [provider]  Cholecalciferol (VITAMIN D-3) 1000 units CAPS Take 1 capsule by mouth daily with supper.     [provider]  Coenzyme Q10 200 MG capsule Take 200 mg by mouth daily.    [provider]  Folic Acid-Vit Z6-XWR U04 (FA-VITAMIN B-6-VITAMIN B-12 PO) Take 1 tablet by mouth daily after breakfast. Reported on 02/04/2016    [provider]  levothyroxine (SYNTHROID, LEVOTHROID) 88 MCG tablet Take 1 tablet (88 mcg total) by mouth daily before breakfast. 10/19/18   Cammie Sickle, MD  Lutein 20 MG CAPS Take 20 mg by mouth daily.    [provider]  Omega-3 Fatty Acids (SUPER OMEGA 3 EPA/DHA) 1000 MG CAPS Take 1,000 mg by mouth daily with supper.    [provider]  omeprazole (PRILOSEC) 40 MG capsule TAKE 1 CAPSULE BY MOUTH EVERY DAY 12/17/18   Tonia Ghent, MD  Potassium Gluconate 595 MG CAPS Take 595 mg by mouth daily.     [provider]  pravastatin (PRAVACHOL) 40 MG tablet Take 1 tablet (40 mg total) by mouth daily. 06/22/18   Tonia Ghent, MD  Calcium Carbonate (CALCIUM 500 PO) Take 1 tablet by mouth every morning.   12/30/15  [provider]  calcium gluconate 500 MG tablet Take 500 mg by mouth daily.    01/10/12  [provider]  metoprolol succinate (TOPROL-XL) 25 MG 24 hr tablet Take 25 mg by mouth at bedtime.    01/10/12  [provider]  simvastatin (ZOCOR) 40 MG tablet Take 40 mg by mouth at bedtime.    01/10/12  [provider]    Allergies  Allergen Reactions  . Atorvastatin     REACTION: myalgias  . Ciprofloxacin     REACTION: increase reflux  . Epinephrine     Heart racing with dental injection  . Metoprolol Succinate Swelling     Swelling and stiffness in hands and ankles  . Simvastatin Other (See Comments)    Muscle aches and stiffness  . Sulfonamide Derivatives     REACTION: itching    Family History  Problem Relation Age of Onset  . Transient ischemic attack Mother   . Hypertension Mother   . Stroke Mother        mini strokes  . Hypertension Sister   . Cancer Sister        ovarian  . Hypertension Sister   . Hypertension Sister   . Hypertension Sister   . Breast cancer Neg Hx   . Colon cancer Neg Hx     Social History Social History   Tobacco Use  . Smoking status: Never Smoker  . Smokeless tobacco: Never Used  Substance Use Topics  . Alcohol use: No    Alcohol/week: 0.0 standard drinks  .  Drug use: No    Review of Systems Constitutional: Negative for fever. ENT: Negative for recent illness/congestion Cardiovascular: Negative for chest pain. Respiratory: Negative for shortness of breath. Gastrointestinal: Negative for abdominal pain.  Positive for diarrhea with dark stool and possible blood in the stool Genitourinary: Negative for urinary compaints Musculoskeletal: Negative for musculoskeletal complaints Skin: Negative for skin complaints  Neurological: Negative for headache All other ROS negative  ____________________________________________   PHYSICAL EXAM:  VITAL SIGNS: ED Triage Vitals [05/16/19 1946]  Enc Vitals Group     BP (!) 166/79     Pulse Rate (!) 108     Resp 18     Temp 98.8 F (37.1 C)     Temp Source Oral     SpO2 100 %     Weight 125 lb (56.7 kg)     Height 5\' 5"  (1.651 m)     Head Circumference      Peak Flow      Pain Score 3     Pain Loc      Pain Edu?      Excl. in [place]?    Constitutional: Alert and oriented. Well appearing and in no distress. Eyes: Normal exam ENT      Head: Normocephalic and atraumatic.      Mouth/Throat: Mucous membranes are moist. Cardiovascular: Normal rate, regular rhythm.  Respiratory: Normal respiratory effort without  tachypnea nor retractions. Breath sounds are clear  Gastrointestinal: Soft, nontender abdomen.  No rebound guarding or distention. Musculoskeletal: Nontender with normal range of motion in all extremities. Neurologic:  Normal speech and language. No gross focal neurologic deficits  Skin:  Skin is warm, dry and intact.  Psychiatric: Mood and affect are normal.  ____________________________________________    INITIAL IMPRESSION / ASSESSMENT AND PLAN / ED COURSE  Pertinent labs & imaging results that were available during my care of the patient were reviewed by me and considered in my medical decision making (see chart for details).   Patient presents emergency department for dark stool.  Differential would include GI bleed, gastritis, dark stool due to diet/medication, gastroenteritis, enteritis.  Patient's labs are largely within normal limits including H&H.  Normal white blood cell count.  We will perform a rectal exam and continue to closely monitor.  Patient has a nontender abdomen and would not require a CT at this time.    Patient's rectal exam shows hematochezia, appears to be a large amount.  Patient will require admission to the hospital for further work-up and management with GI consultation.  Patient agreeable to plan of care.  [name] was evaluated in Emergency Department on 05/16/2019 for the symptoms described in the history of present illness. She was evaluated in the context of the global COVID-19 pandemic, which necessitated consideration that the patient might be at risk for infection with the SARS-CoV-2 virus that causes COVID-19. Institutional protocols and algorithms that pertain to the evaluation of patients at risk for COVID-19 are in a state of rapid change based on information released by regulatory bodies including the CDC and federal and state organizations. These policies and algorithms were followed during the patient's care in the  ED.  ____________________________________________   FINAL CLINICAL IMPRESSION(S) / ED DIAGNOSES  Lower GI bleed   Harvest Dark, MD 05/16/19 2148

## 2019-05-17 ENCOUNTER — Inpatient Hospital Stay: Payer: PPO

## 2019-05-17 DIAGNOSIS — Z881 Allergy status to other antibiotic agents status: Secondary | ICD-10-CM | POA: Diagnosis not present

## 2019-05-17 DIAGNOSIS — K921 Melena: Secondary | ICD-10-CM | POA: Diagnosis present

## 2019-05-17 DIAGNOSIS — K922 Gastrointestinal hemorrhage, unspecified: Secondary | ICD-10-CM | POA: Diagnosis present

## 2019-05-17 DIAGNOSIS — K635 Polyp of colon: Secondary | ICD-10-CM | POA: Diagnosis present

## 2019-05-17 DIAGNOSIS — E785 Hyperlipidemia, unspecified: Secondary | ICD-10-CM | POA: Diagnosis present

## 2019-05-17 DIAGNOSIS — K317 Polyp of stomach and duodenum: Secondary | ICD-10-CM | POA: Diagnosis present

## 2019-05-17 DIAGNOSIS — Z79890 Hormone replacement therapy (postmenopausal): Secondary | ICD-10-CM | POA: Diagnosis not present

## 2019-05-17 DIAGNOSIS — K219 Gastro-esophageal reflux disease without esophagitis: Secondary | ICD-10-CM | POA: Diagnosis present

## 2019-05-17 DIAGNOSIS — Z1159 Encounter for screening for other viral diseases: Secondary | ICD-10-CM | POA: Diagnosis not present

## 2019-05-17 DIAGNOSIS — Z85850 Personal history of malignant neoplasm of thyroid: Secondary | ICD-10-CM | POA: Diagnosis not present

## 2019-05-17 DIAGNOSIS — Z823 Family history of stroke: Secondary | ICD-10-CM | POA: Diagnosis not present

## 2019-05-17 DIAGNOSIS — Z8249 Family history of ischemic heart disease and other diseases of the circulatory system: Secondary | ICD-10-CM | POA: Diagnosis not present

## 2019-05-17 DIAGNOSIS — I10 Essential (primary) hypertension: Secondary | ICD-10-CM | POA: Diagnosis present

## 2019-05-17 DIAGNOSIS — Z882 Allergy status to sulfonamides status: Secondary | ICD-10-CM | POA: Diagnosis not present

## 2019-05-17 DIAGNOSIS — E890 Postprocedural hypothyroidism: Secondary | ICD-10-CM | POA: Diagnosis present

## 2019-05-17 DIAGNOSIS — D122 Benign neoplasm of ascending colon: Secondary | ICD-10-CM | POA: Diagnosis not present

## 2019-05-17 DIAGNOSIS — Z888 Allergy status to other drugs, medicaments and biological substances status: Secondary | ICD-10-CM | POA: Diagnosis not present

## 2019-05-17 DIAGNOSIS — Z85118 Personal history of other malignant neoplasm of bronchus and lung: Secondary | ICD-10-CM | POA: Diagnosis not present

## 2019-05-17 DIAGNOSIS — Z8041 Family history of malignant neoplasm of ovary: Secondary | ICD-10-CM | POA: Diagnosis not present

## 2019-05-17 LAB — CBC
HCT: 37.3 % (ref 36.0–46.0)
Hemoglobin: 12.3 g/dL (ref 12.0–15.0)
MCH: 29.9 pg (ref 26.0–34.0)
MCHC: 33 g/dL (ref 30.0–36.0)
MCV: 90.8 fL (ref 80.0–100.0)
Platelets: 129 10*3/uL — ABNORMAL LOW (ref 150–400)
RBC: 4.11 MIL/uL (ref 3.87–5.11)
RDW: 14.9 % (ref 11.5–15.5)
WBC: 9.4 10*3/uL (ref 4.0–10.5)
nRBC: 0 % (ref 0.0–0.2)

## 2019-05-17 LAB — BASIC METABOLIC PANEL
Anion gap: 7 (ref 5–15)
BUN: 18 mg/dL (ref 8–23)
CO2: 24 mmol/L (ref 22–32)
Calcium: 7.9 mg/dL — ABNORMAL LOW (ref 8.9–10.3)
Chloride: 111 mmol/L (ref 98–111)
Creatinine, Ser: 0.52 mg/dL (ref 0.44–1.00)
GFR calc Af Amer: >60 mL/min (ref >60)
GFR calc non Af Amer: >60 mL/min (ref >60)
Glucose, Bld: 116 mg/dL — ABNORMAL HIGH (ref 70–99)
Potassium: 3.9 mmol/L (ref 3.5–5.1)
Sodium: 142 mmol/L (ref 135–145)

## 2019-05-17 LAB — TSH: TSH: 1.261 u[IU]/mL (ref 0.350–4.500)

## 2019-05-17 LAB — HEMOGLOBIN AND HEMATOCRIT, BLOOD
HCT: 37.3 % (ref 36.0–46.0)
HCT: 36 % (ref 36.0–46.0)
Hemoglobin: 12.5 g/dL (ref 12.0–15.0)
Hemoglobin: 12.1 g/dL (ref 12.0–15.0)

## 2019-05-17 LAB — PROTIME-INR
INR: 1.1 (ref 0.8–1.2)
Prothrombin Time: 14.2 seconds (ref 11.4–15.2)

## 2019-05-17 LAB — PREPARE RBC (CROSSMATCH)

## 2019-05-17 MED ORDER — SODIUM CHLORIDE 0.9 % IV SOLN
INTRAVENOUS | Status: DC
  Administered 2019-05-17 – 2019-05-19 (×4): via INTRAVENOUS

## 2019-05-17 MED ORDER — TECHNETIUM TC 99M-LABELED RED BLOOD CELLS IV KIT
21.8900 | PACK | Freq: Once | INTRAVENOUS | Status: AC | PRN
  Administered 2019-05-17: 21.89 via INTRAVENOUS

## 2019-05-17 MED ORDER — CALCIUM GLUCONATE-NACL 1-0.675 GM/50ML-% IV SOLN
1.0000 g | Freq: Once | INTRAVENOUS | Status: AC
  Administered 2019-05-17: 17:00:00 1000 mg via INTRAVENOUS
  Filled 2019-05-17: qty 50

## 2019-05-17 MED ORDER — ONDANSETRON HCL 4 MG/2ML IJ SOLN
4.0000 mg | Freq: Four times a day (QID) | INTRAMUSCULAR | Status: DC | PRN
  Administered 2019-05-17: 4 mg via INTRAVENOUS
  Filled 2019-05-17: qty 2

## 2019-05-17 MED ORDER — SODIUM CHLORIDE 0.9% FLUSH
3.0000 mL | Freq: Two times a day (BID) | INTRAVENOUS | Status: DC
  Administered 2019-05-17 – 2019-05-18 (×5): 3 mL via INTRAVENOUS

## 2019-05-17 MED ORDER — ACETAMINOPHEN 650 MG RE SUPP: 650.0000 mg | Freq: Four times a day (QID) | RECTAL | Status: DC | PRN

## 2019-05-17 MED ORDER — SODIUM CHLORIDE 0.9 % IV SOLN: 250.0000 mL | INTRAVENOUS | Status: DC | PRN

## 2019-05-17 MED ORDER — PANTOPRAZOLE SODIUM 40 MG PO TBEC: 40.0000 mg | DELAYED_RELEASE_TABLET | Freq: Every day | ORAL | Status: DC

## 2019-05-17 MED ORDER — SODIUM CHLORIDE 0.9% FLUSH: 3.0000 mL | INTRAVENOUS | Status: DC | PRN

## 2019-05-17 MED ORDER — PEG 3350-KCL-NA BICARB-NACL 420 G PO SOLR
4000.0000 mL | Freq: Once | ORAL | Status: AC
  Administered 2019-05-17: 4000 mL via ORAL
  Filled 2019-05-17: qty 4000

## 2019-05-17 MED ORDER — ONDANSETRON HCL 4 MG PO TABS: 4.0000 mg | ORAL_TABLET | Freq: Four times a day (QID) | ORAL | Status: DC | PRN

## 2019-05-17 MED ORDER — SODIUM CHLORIDE 0.9% FLUSH: 3.0000 mL | Freq: Two times a day (BID) | INTRAVENOUS | Status: DC

## 2019-05-17 MED ORDER — ACETAMINOPHEN 325 MG PO TABS: 650.0000 mg | ORAL_TABLET | Freq: Four times a day (QID) | ORAL | Status: DC | PRN

## 2019-05-17 MED ORDER — VITAMIN C 500 MG PO TABS
2000.0000 mg | ORAL_TABLET | Freq: Every day | ORAL | Status: DC
  Administered 2019-05-19: 11:00:00 2000 mg via ORAL
  Filled 2019-05-17: qty 4

## 2019-05-17 MED ORDER — SODIUM CHLORIDE 0.9 % IV SOLN: Freq: Once | INTRAVENOUS | Status: DC

## 2019-05-17 MED ORDER — MAGNESIUM CITRATE PO SOLN
1.0000 | Freq: Once | ORAL | Status: AC
  Administered 2019-05-17: 17:00:00 1 via ORAL
  Filled 2019-05-17: qty 296

## 2019-05-17 MED ORDER — LEVOTHYROXINE SODIUM 88 MCG PO TABS
88.0000 ug | ORAL_TABLET | Freq: Every day | ORAL | Status: DC
  Administered 2019-05-18 – 2019-05-19 (×2): 88 ug via ORAL
  Filled 2019-05-17 (×3): qty 1

## 2019-05-17 MED ORDER — PRAVASTATIN SODIUM 20 MG PO TABS
40.0000 mg | ORAL_TABLET | Freq: Every day | ORAL | Status: DC
  Administered 2019-05-17 – 2019-05-18 (×2): 40 mg via ORAL
  Filled 2019-05-17 (×2): qty 2

## 2019-05-17 NOTE — ED Notes (Signed)
ED TO INPATIENT HANDOFF REPORT  ED Nurse Name and Phone #:  Quillian Quince [phone]  S Name/Age/Gender [name] 73 y.o. female Room/Bed: ED19A/ED19A  Code Status   Code Status: Prior  Home/SNF/Other Home Patient oriented to: self, place, time and situation Is this baseline? Yes   Triage Complete: Triage complete  Chief Complaint Bloody Stool  Triage Note Patient ambulatory to triage with steady gait, without difficulty or distress noted, mask in place; pt reports bloody diarrhea with lower abd/back pain this evening   Allergies Allergies  Allergen Reactions  . Atorvastatin     REACTION: myalgias  . Ciprofloxacin     REACTION: increase reflux  . Epinephrine     Heart racing with dental injection  . Metoprolol Succinate Swelling    Swelling and stiffness in hands and ankles  . Simvastatin Other (See Comments)    Muscle aches and stiffness  . Sulfonamide Derivatives     REACTION: itching    Level of Care/Admitting Diagnosis ED Disposition    ED Disposition Condition [facility]: [place] [[id number]]  Level of Care: Med-Surg [16]  Covid Evaluation: Confirmed COVID Negative  Diagnosis: GI bleed [[id number]]  Admitting Physician: Mayer Camel [[id number]]  Attending Physician: Mayer Camel [[id number]]  Estimated length of stay: past midnight tomorrow  Certification:: I certify this patient will need inpatient services for at least 2 midnights  PT Class (Do Not Modify): Inpatient [101]  PT Acc Code (Do Not Modify): Private [1]       B Medical/Surgery History Past Medical History:  Diagnosis Date  . Adenocarcinoma of right lung ([place]) 10/01/2015  . GERD (gastroesophageal reflux disease)   . Groin pain    Along superior/laterl R inguinal canal.  Could be due to hernia or old scar tissue.  prev with CT done w/o alarming findings.  Will treat episodically.  Use tylenol #3 prn.    . Hyperlipidemia   . Lipoma of thigh    Left  . Normal cardiac stress test 2014  . PONV (postoperative nausea and vomiting)    nausea after hysterectomy, and 2 days after lung surgery 10/2015  . Thyroid cancer ([place]) 12/2015  . Thyroid nodule    Past Surgical History:  Procedure Laterality Date  . ABDOMINAL HYSTERECTOMY  12-30-08  . APPENDECTOMY  11/23/2016   Dr. Adonis Huguenin  . BREAST EXCISIONAL BIOPSY Right 1977  . BREAST MASS EXCISION  1977   benign  . CARPAL TUNNEL RELEASE Right 1978  . CATARACT EXTRACTION, BILATERAL Bilateral   . CHOLECYSTECTOMY  11/23/2016   Procedure: LAPAROSCOPIC CHOLECYSTECTOMY;  Surgeon: Clayburn Pert, MD;  Location: [facility] ORS;  Service: General;;  . COLONOSCOPY  2009  . DILATION AND CURETTAGE OF UTERUS  2001  . ELECTROMAGNETIC NAVIGATION BROCHOSCOPY N/A 08/25/2015   Procedure: ELECTROMAGNETIC NAVIGATION BRONCHOSCOPY;  Surgeon: Flora Lipps, MD;  Location: [facility] ORS;  Service: Cardiopulmonary;  Laterality: N/A;  . ENDOBRONCHIAL ULTRASOUND N/A 08/25/2015   Procedure: ENDOBRONCHIAL ULTRASOUND;  Surgeon: Flora Lipps, MD;  Location: [facility] ORS;  Service: Cardiopulmonary;  Laterality: N/A;  . ESOPHAGOGASTRODUODENOSCOPY  2002, 11/15/07   Normal (Dr. Allyn Kenner)  . FACIAL COSMETIC SURGERY  01/13/10   mini facelift  . HERNIA REPAIR  1976  . THORACOTOMY/LOBECTOMY Right 10/27/2015   Procedure: THORACOTOMY/LOBECTOMY;  Surgeon: Nestor Lewandowsky, MD;  Location: [facility] OR[facility];  Service: Thoracic;  Laterality: Right;  . THYROIDECTOMY N/A 12/29/2015   Procedure: THYROIDECTOMY;  Surgeon: Beverly Gust, MD;  Location: [facility] ORS;  Service: ENT;  Laterality: N/A;  . TOTAL VAGINAL HYSTERECTOMY  12/30/08   for fibroid pain (Dr. Gertie Fey)     A IV Location/Drains/Wounds Patient Lines/Drains/Airways Status   Active Line/Drains/Airways    Name:   Placement date:   Placement time:   Site:   Days:   Peripheral IV 01/05/19 Left Antecubital   01/05/19    1251    Antecubital   132   Peripheral IV 05/17/19 Right Antecubital   05/17/19    0007     Antecubital   less than 1   Incision (Closed) 12/29/15 Neck   12/29/15    0747     1235   Incision (Closed) 11/23/16 Abdomen   11/23/16    1527     905   Incision - 4 Ports Abdomen Umbilicus Mid Right;Lateral Right;Lateral   11/23/16    1515     905          Intake/Output Last 24 hours  Intake/Output Summary (Last 24 hours) at 05/17/2019 0247 Last data filed at 05/17/2019 0214 Gross per 24 hour  Intake 1110 ml  Output -  Net 1110 ml    Labs/Imaging Results for orders placed or performed during the hospital encounter of 05/16/19 (from the past 48 hour(s))  CBC with Differential     Status: None   Collection Time: 05/16/19  7:50 PM  Result Value Ref Range   WBC 9.5 4.0 - 10.5 K/uL   RBC 4.13 3.87 - 5.11 MIL/uL   Hemoglobin 12.5 12.0 - 15.0 g/dL   HCT 38.7 36.0 - 46.0 %   MCV 93.7 80.0 - 100.0 fL   MCH 30.3 26.0 - 34.0 pg   MCHC 32.3 30.0 - 36.0 g/dL   RDW 14.3 11.5 - 15.5 %   Platelets 182 150 - 400 K/uL   nRBC 0.0 0.0 - 0.2 %   Neutrophils Relative % 53 %   Neutro Abs 5.0 1.7 - 7.7 K/uL   Lymphocytes Relative 37 %   Lymphs Abs 3.5 0.7 - 4.0 K/uL   Monocytes Relative 8 %   Monocytes Absolute 0.7 0.1 - 1.0 K/uL   Eosinophils Relative 1 %   Eosinophils Absolute 0.1 0.0 - 0.5 K/uL   Basophils Relative 1 %   Basophils Absolute 0.1 0.0 - 0.1 K/uL   Immature Granulocytes 0 %   Abs Immature Granulocytes 0.04 0.00 - 0.07 K/uL    Comment: Performed at [facility], [address] [place], [place] [address]  Comprehensive metabolic panel     Status: Abnormal   Collection Time: 05/16/19  7:50 PM  Result Value Ref Range   Sodium 138 135 - 145 mmol/L   Potassium 4.0 3.5 - 5.1 mmol/L   Chloride 103 98 - 111 mmol/L   CO2 28 22 - 32 mmol/L   Glucose, Bld 151 (H) 70 - 99 mg/dL   BUN 20 8 - 23 mg/dL   Creatinine, Ser 0.81 0.44 - 1.00 mg/dL   Calcium 9.0 8.9 - 10.3 mg/dL   Total Protein 6.6 6.5 - 8.1 g/dL   Albumin 4.1 3.5 - 5.0 g/dL   AST 19 15 - 41 U/L   ALT 21 0 -  44 U/L   Alkaline Phosphatase 57 38 - 126 U/L   Total Bilirubin 0.7 0.3 - 1.2 mg/dL   GFR calc non Af Amer >60 >60 mL/min   GFR calc Af Amer >60 >60 mL/min   Anion gap 7 5 - 15  Comment: Performed at [facility], [address] [place], [place] [address]  Lipase, blood     Status: None   Collection Time: 05/16/19  7:50 PM  Result Value Ref Range   Lipase 36 11 - 51 U/L    Comment: Performed at [facility], [address] [place], [place] [address]  Urinalysis, Complete w Microscopic     Status: Abnormal   Collection Time: 05/16/19  7:50 PM  Result Value Ref Range   Color, Urine YELLOW (A) YELLOW   APPearance CLEAR (A) CLEAR   Specific Gravity, Urine 1.020 1.005 - 1.030   pH 5.0 5.0 - 8.0   Glucose, UA NEGATIVE NEGATIVE mg/dL   Hgb urine dipstick NEGATIVE NEGATIVE   Bilirubin Urine NEGATIVE NEGATIVE   Ketones, ur NEGATIVE NEGATIVE mg/dL   Protein, ur NEGATIVE NEGATIVE mg/dL   Nitrite NEGATIVE NEGATIVE   Leukocytes,Ua TRACE (A) NEGATIVE   WBC, UA 0-5 0 - 5 WBC/hpf   Bacteria, UA NONE SEEN NONE SEEN   Squamous Epithelial / LPF NONE SEEN 0 - 5   Mucus PRESENT     Comment: Performed at [facility], [address], [place], [place] [address]  SARS Coronavirus 2 (CEPHEID - Performed in [place] hospital lab), Hosp Order     Status: None   Collection Time: 05/16/19 10:32 PM   Specimen: Nasopharyngeal Swab  Result Value Ref Range   SARS Coronavirus 2 NEGATIVE NEGATIVE    Comment: (NOTE) If result is NEGATIVE SARS-CoV-2 target nucleic acids are NOT DETECTED. The SARS-CoV-2 RNA is generally detectable in upper and lower  respiratory specimens during the acute phase of infection. The lowest  concentration of SARS-CoV-2 viral copies this assay can detect is 250  copies / mL. A negative result does not preclude SARS-CoV-2 infection  and should not be used as the sole basis for treatment or other  patient management decisions.  A negative result  may occur with  improper specimen collection / handling, submission of specimen other  than nasopharyngeal swab, presence of viral mutation(s) within the  areas targeted by this assay, and inadequate number of viral copies  (<250 copies / mL). A negative result must be combined with clinical  observations, patient history, and epidemiological information. If result is POSITIVE SARS-CoV-2 target nucleic acids are DETECTED. The SARS-CoV-2 RNA is generally detectable in upper and lower  respiratory specimens dur ing the acute phase of infection.  Positive  results are indicative of active infection with SARS-CoV-2.  Clinical  correlation with patient history and other diagnostic information is  necessary to determine patient infection status.  Positive results do  not rule out bacterial infection or co-infection with other viruses. If result is PRESUMPTIVE POSTIVE SARS-CoV-2 nucleic acids MAY BE PRESENT.   A presumptive positive result was obtained on the submitted specimen  and confirmed on repeat testing.  While 2019 novel coronavirus  (SARS-CoV-2) nucleic acids may be present in the submitted sample  additional confirmatory testing may be necessary for epidemiological  and / or clinical management purposes  to differentiate between  SARS-CoV-2 and other Sarbecovirus currently known to infect humans.  If clinically indicated additional testing with an alternate test  methodology [phone]) is advised. The SARS-CoV-2 RNA is generally  detectable in upper and lower respiratory sp ecimens during the acute  phase of infection. The expected result is Negative. Fact Sheet for Patients:  StrictlyIdeas.no Fact Sheet for Healthcare Providers: BankingDealers.co.za This test is not yet approved or cleared  by the [place] and has been authorized for detection and/or diagnosis of SARS-CoV-2 by FDA under an Emergency Use Authorization (EUA).   This EUA will remain in effect (meaning this test can be used) for the duration of the COVID-19 declaration under Section 564(b)(1) of the Act, 21 U.S.C. section 360bbb-3(b)(1), unless the authorization is terminated or revoked sooner. Performed at [facility], [address] [place], [place] [address]   Prepare RBC (crossmatch)     Status: None   Collection Time: 05/16/19 11:59 PM  Result Value Ref Range   Order Confirmation      ORDER PROCESSED BY BLOOD BANK Performed at [facility], [address] [place], [place] [address]   Type and screen Ordered by PROVIDER DEFAULT     Status: None (Preliminary result)   Collection Time: 05/17/19 12:21 AM  Result Value Ref Range   ABO/RH(D) A POS    Antibody Screen NEG    Sample Expiration 05/20/2019,2359    Unit Number [id number]    Blood Component Type RED CELLS,LR    Unit division 00    Status of Unit ISSUED    Transfusion Status OK TO TRANSFUSE    Crossmatch Result      COMPATIBLE Performed at [facility], [address] [place], [place] [address]    Unit Number [id number]    Blood Component Type RED CELLS,LR    Unit division 00    Status of Unit ISSUED    Transfusion Status OK TO TRANSFUSE    Crossmatch Result COMPATIBLE    No results found.  Pending Labs [employer] (From admission, onward)    Start     Ordered   Signed and Held  TSH  Once,   R     Signed and Held   Signed and Occupational hygienist morning,   R     Signed and Held   Signed and Held  CBC  Tomorrow morning,   R     Signed and Held   Signed and Held  Protime-INR  Tomorrow morning,   R     Signed and Held   Signed and Held  Hemoglobin and hematocrit, blood  Now then every 6 hours,   R     Signed and Held          Vitals/Pain Today's Vitals   05/17/19 0052 05/17/19 0057 05/17/19 0113 05/17/19 0214  BP: (!) 114/59 122/62 112/72 113/65  Pulse: 76 77 85 79  Resp: 20 20 17 16   Temp: 97.8  F (36.6 C) 97.8 F (36.6 C) 98 F (36.7 C) 98 F (36.7 C)  TempSrc:  Oral Oral Oral  SpO2: 100%  99%   Weight:      Height:      PainSc:        Isolation Precautions No active isolations  Medications Medications  pantoprazole (PROTONIX) injection 40 mg (has no administration in time range)  0.9 %  sodium chloride infusion (has no administration in time range)    Mobility Patient walks independently at home. It is advisable that she have a person assist while in the hospital. Low fall risk   Focused Assessments Nausea/Gastrointestinal   R Recommendations: See Admitting Provider Note  Report given to:   Additional Notes: Patient has had 10 episodes of bloody diarrhea in the past 24 hours. She has received 2 units of blood.

## 2019-05-17 NOTE — ED Notes (Addendum)
Pt heard outside of room screaming " can somebody help me". Pt stated feeling dizzy and concerned with bloody stools. Pt assisted to foot of bed to sit down by this tech and [employer]. Pt then stated the urge to use the bathroom again. Assisted pt to toilet. Daniel RN enters room. Pt clothing and shoes removed to place gown and socks on while pt sitting on toliet. Pt stands up with assistance of this tech and Kennon Portela to wipe and clean. Pt then becomes heavy and passes out in arms of Kennon Portela and this tech. Pt slowly assisted to floor while awaiting additional staff assistance. Pt then becomes alert and states "what happened". Arby Barrette RN, Myra EDT enters and all staff assist pt onto bed. Vital signs taken.

## 2019-05-17 NOTE — Progress Notes (Signed)
[facility] NAME: Waniya Hoglund    MR#:  [id number]  Lindsey:  October 28, 1946  SUBJECTIVE:   Patient presents to the hospital due to abdominal pain and having diarrhea which was bloody in nature.  Patient had multiple episodes of bloody stools and therefore was admitted to the hospital.  Hemoglobin is stable  REVIEW OF SYSTEMS:    Review of Systems  Constitutional: Negative for chills and fever.  HENT: Negative for congestion and tinnitus.   Eyes: Negative for blurred vision and double vision.  Respiratory: Negative for cough, shortness of breath and wheezing.   Cardiovascular: Negative for chest pain, orthopnea and PND.  Gastrointestinal: Positive for abdominal pain and blood in stool. Negative for diarrhea, nausea and vomiting.  Genitourinary: Negative for dysuria and hematuria.  Neurological: Negative for dizziness, sensory change and focal weakness.  All other systems reviewed and are negative.   Nutrition: NPO Tolerating Diet: No Tolerating PT: Await Eval.    DRUG ALLERGIES:   Allergies  Allergen Reactions  . Atorvastatin     REACTION: myalgias  . Ciprofloxacin     REACTION: increase reflux  . Epinephrine     Heart racing with dental injection  . Metoprolol Succinate Swelling    Swelling and stiffness in hands and ankles  . Simvastatin Other (See Comments)    Muscle aches and stiffness  . Sulfonamide Derivatives     REACTION: itching    VITALS:  Blood pressure (!) 133/59, pulse 72, temperature 97.6 F (36.4 C), temperature source Oral, resp. rate 16, height 5\' 4"  (1.626 m), weight 56.2 kg, SpO2 100 %.  PHYSICAL EXAMINATION:   Physical Exam  GENERAL:  73 y.o.-year-old patient lying in bed in no acute distress.  EYES: Pupils equal, round, reactive to light and accommodation. No scleral icterus. Extraocular muscles intact.  HEENT: Head atraumatic, normocephalic. Oropharynx and nasopharynx clear.   NECK:  Supple, no jugular venous distention. No thyroid enlargement, no tenderness.  LUNGS: Normal breath sounds bilaterally, no wheezing, rales, rhonchi. No use of accessory muscles of respiration.  CARDIOVASCULAR: S1, S2 normal. No murmurs, rubs, or gallops.  ABDOMEN: Soft, nontender, nondistended. Bowel sounds present. No organomegaly or mass.  EXTREMITIES: No cyanosis, clubbing or edema b/l.    NEUROLOGIC: Cranial nerves II through XII are intact. No focal Motor or sensory deficits b/l.   PSYCHIATRIC: The patient is alert and oriented x 3.  SKIN: No obvious rash, lesion, or ulcer.    LABORATORY PANEL:   CBC Recent Labs  Lab 05/17/19 0345 05/17/19 0949  WBC 9.4  --   HGB 12.3 12.5  HCT 37.3 37.3  PLT 129*  --    ------------------------------------------------------------------------------------------------------------------  Chemistries  Recent Labs  Lab 05/16/19 1950 05/17/19 0345  NA 138 142  K 4.0 3.9  CL 103 111  CO2 28 24  GLUCOSE 151* 116*  BUN 20 18  CREATININE 0.81 0.52  CALCIUM 9.0 7.9*  AST 19  --   ALT 21  --   ALKPHOS 57  --   BILITOT 0.7  --    ------------------------------------------------------------------------------------------------------------------  Cardiac Enzymes No results for input(s): TROPONINI in the last 168 hours. ------------------------------------------------------------------------------------------------------------------  RADIOLOGY:  Nm Gi Blood Loss  Result Date: 05/17/2019 CLINICAL DATA:  73 year old who had an episode of bright red blood per rectum last night and had an episode of dark red blood in her stool this morning. Patient became syncopal yesterday due to the  amount of blood loss. Personal history of RIGHT lower lobectomy for lung cancer. EXAM: NUCLEAR MEDICINE GASTROINTESTINAL BLEEDING SCAN TECHNIQUE: Sequential abdominal images were obtained following intravenous administration of Tc-25m labeled red blood cells.  RADIOPHARMACEUTICALS:  21.9 mCi Tc-53m pertechnetate in-vitro labeled red cells. COMPARISON:  No prior nuclear imaging. PET-CT 06/13/2018. CT abdomen and pelvis 06/07/2018 and earlier are correlated. FINDINGS: Very early activity is present in the LOWER pelvis in the expected location of the rectum which persists throughout the entire 2 hour imaging sequence. No abnormal activity is identified elsewhere. Expected activity in the blood pool, liver, spleen, an urinary tract is present. IMPRESSION: Active bleeding from the rectum is suspected, identified early in the imaging sequence and persistent throughout the 2 hours of imaging. Electronically Signed   By: Evangeline Dakin M.D.   On: 05/17/2019 14:49     ASSESSMENT AND PLAN:   73 year old female with past medical history of hypothyroidism, history of thyroid cancer, hyperlipidemia, GERD, history of lung cancer who presents to the hospital due to bloody diarrhea.  1.  GI bleed-suspected to be a lower GI bleed given the patient's rectal bleeding. - Given her multiple episodes of bleeding patient underwent a nuclear medicine GI bleeding scan which showed positive activity in the rectum.  Discussed with gastroenterology who did a rectal exam and the bladder is mostly maroon and old blood. -We will hold off on vascular surgery consult for now. - Plan for EGD and colonoscopy tomorrow. -Continue Protonix  2.  Hypothyroidism-continue Synthroid.  3.  Hyperlipidemia-continue Pravachol.   All the records are reviewed and case discussed with Care Management/Social Worker. Management plans discussed with the patient, family and they are in agreement.  CODE STATUS: Full code  DVT Prophylaxis: Ted's & SCD's   TOTAL TIME TAKING CARE OF THIS PATIENT: 30 minutes.   POSSIBLE D/C IN 1-2 DAYS, DEPENDING ON CLINICAL CONDITION.   Henreitta Leber M.D on 05/17/2019 at 3:10 PM  Between 7am to 6pm - Pager - [phone]  After 6pm go to [email or web address] -  Proofreader  [facility]  Office  [phone]  CC: Primary care physician; Tonia Ghent, MD

## 2019-05-17 NOTE — ED Provider Notes (Addendum)
I assumed care of the patient from Dr. Kerman Passey at 11:00 PM.  I was notified by the nursing staff that the patient went to the bathroom and had a large volume blood bowel movement.  Staff states that the patient did briefly lose consciousness however that she did not hit the floor.  On my arrival to the room patient pale approximately 2 units of blood noted in the commode and on the floor.  Patient very pale with very pale conjunctiva on my arrival.  Patient's blood pressure was 166/79 earlier in the evening but on my arrival to the room the patient's blood pressure was 127/82 and subsequently 116/69. I immediately started 2 L of IV normal saline.  I ordered and initiated 2 units of uncrossed matched blood.  CRITICAL CARE Performed by: Gregor Hams   Total critical care time: 30 minutes secondary to hemorrhagic shock due to GI bleed  Critical care time was exclusive of separately billable procedures and treating other patients.  Critical care was necessary to treat or prevent imminent or life-threatening deterioration.  Critical care was time spent personally by me on the following activities: development of treatment plan with patient and/or surrogate as well as nursing, discussions with consultants, evaluation of patient's response to treatment, examination of patient, obtaining history from patient or surrogate, ordering and performing treatments and interventions, ordering and review of laboratory studies, ordering and review of radiographic studies, pulse oximetry and re-evaluation of patient's condition. care   Gregor Hams, MD 05/17/19 0019    Gregor Hams, MD 05/17/19 Quentin Mulling

## 2019-05-17 NOTE — Consult Note (Signed)
Cephas Darby, MD [address]  [place]  [place], [place] [address]  Main: [phone]  Fax: [phone] Pager: [phone]   Consultation  Referring Provider:     No ref. provider found Primary Care Physician:  Tonia Ghent, MD Primary Gastroenterologist: None       Reason for Consultation:     Hematochezia  Date of Admission:  05/16/2019 Date of Consultation:  05/17/2019         HPI:   [name] is a 73 y.o. female with history of lung cancer, thyroid cancer in remission who presented with painless hematochezia.  She reports noticing several episodes of dark and red blood mixed with stool that started yesterday evening.  When she came to ER last night, she was hemodynamically stable, her hemoglobin was 12.5, normal BUN/creatinine.  However early this morning, she was trying to get up from the bed and passed out.  Because of GI bleed, she received 2 units of PRBCs, her hemoglobin posttransfusion was 12.3, repeat early this morning was 12.5.  She denies abdominal pain other than mild lower abdominal discomfort, denies nausea or vomiting.  Denies similar episodes in the past.  She reports having regular bowel movements.  She started on pantoprazole drip, n.p.o., bleeding scan was ordered.  Patient underwent tagged RBC scan this afternoon which revealed pooling of the tracer activity in the rectum for 2-hour period.  When I went to see the patient after bleeding scan, she was resting comfortably in bed.  She denied any GI symptoms.  She reported that she did not have any further bleeding episodes today.  She is physically and functionally independent, taking care of her husband who had stroke.  NSAIDs: None  Antiplts/Anticoagulants/Anti thrombotics: None  GI Procedures: EGD and colonoscopy in 2009, reportedly normal  Past Medical History:  Diagnosis Date  . 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 GERD (gastroesophageal reflux disease)   . Groin pain    Along superior/laterl Karen inguinal canal.  Could be due to hernia or old scar tissue.  prev with CT done w/o alarming findings.  Will treat episodically.  Use tylenol #3 prn.    . Hyperlipidemia   . Lipoma of thigh    Left  . Normal cardiac stress test 2014  . PONV (postoperative nausea and vomiting)    nausea after hysterectomy, and 2 days after lung surgery 10/2015  . Thyroid cancer ([place]) 12/2015  . Thyroid nodule     Past Surgical History:  Procedure Laterality Date  . ABDOMINAL HYSTERECTOMY  12-30-08  . APPENDECTOMY  11/23/2016   Dr. Adonis Huguenin  . BREAST EXCISIONAL BIOPSY Right 1977  . BREAST MASS EXCISION  1977   benign  . CARPAL TUNNEL RELEASE Right 1978  . CATARACT EXTRACTION, BILATERAL Bilateral   . CHOLECYSTECTOMY  11/23/2016   Procedure: LAPAROSCOPIC CHOLECYSTECTOMY;  Surgeon: Clayburn Pert, MD;  Location: [facility] ORS;  Service: General;;  . COLONOSCOPY  2009  . DILATION AND CURETTAGE OF UTERUS  2001  . ELECTROMAGNETIC NAVIGATION BROCHOSCOPY N/A 08/25/2015   Procedure: ELECTROMAGNETIC NAVIGATION BRONCHOSCOPY;  Surgeon: Flora Lipps, MD;  Location: [facility] ORS;  Service: Cardiopulmonary;  Laterality: N/A;  . ENDOBRONCHIAL ULTRASOUND N/A 08/25/2015   Procedure: ENDOBRONCHIAL ULTRASOUND;  Surgeon: Flora Lipps, MD;  Location: [facility] ORS;  Service: Cardiopulmonary;  Laterality: N/A;  . ESOPHAGOGASTRODUODENOSCOPY  2002, 11/15/07   Normal (Dr. Allyn Kenner)  . FACIAL COSMETIC SURGERY  01/13/10   mini facelift  . HERNIA REPAIR  1976  .  THORACOTOMY/LOBECTOMY Right 10/27/2015   Procedure: THORACOTOMY/LOBECTOMY;  Surgeon: Nestor Lewandowsky, MD;  Location: [facility] ORS;  Service: Thoracic;  Laterality: Right;  . THYROIDECTOMY N/A 12/29/2015   Procedure: THYROIDECTOMY;  Surgeon: Beverly Gust, MD;  Location: [facility] ORS;  Service: ENT;  Laterality: N/A;  . TOTAL VAGINAL HYSTERECTOMY  12/30/08   for fibroid pain (Dr. Gertie Fey)    Prior to Admission medications   Medication Sig Start Date End Date Taking?  Authorizing Provider  Ascorbic Acid (VITAMIN C) 500 MG tablet Take 2,000 mg by mouth daily. Reported on 02/04/2016   Yes [provider]  Biotin (PA BIOTIN) 1000 MCG tablet Take 1,000 mcg by mouth daily. Reported on 02/04/2016   Yes [provider]  CALCIUM-MAG-VIT C-VIT D PO Take by mouth daily.   Yes [provider]  Cholecalciferol (VITAMIN D-3) 1000 units CAPS Take 1 capsule by mouth daily with supper.    Yes [provider]  Coenzyme Q10 200 MG capsule Take 200 mg by mouth daily.   Yes [provider]  Folic Acid-Vit B2-WUX L24 (FA-VITAMIN B-6-VITAMIN B-12 PO) Take 1 tablet by mouth daily after breakfast. Reported on 02/04/2016   Yes [provider]  levothyroxine (SYNTHROID, LEVOTHROID) 88 MCG tablet Take 1 tablet (88 mcg total) by mouth daily before breakfast. 10/19/18  Yes Cammie Sickle, MD  Lutein 20 MG CAPS Take 20 mg by mouth daily.   Yes [provider]  Omega-3 Fatty Acids (SUPER OMEGA 3 EPA/DHA) 1000 MG CAPS Take 1,000 mg by mouth daily with supper.   Yes [provider]  omeprazole (PRILOSEC) 40 MG capsule TAKE 1 CAPSULE BY MOUTH EVERY DAY 12/17/18  Yes Tonia Ghent, MD  Potassium Gluconate 595 MG CAPS Take 595 mg by mouth daily.    Yes [provider]  pravastatin (PRAVACHOL) 40 MG tablet Take 1 tablet (40 mg total) by mouth daily. 06/22/18  Yes Tonia Ghent, MD  Calcium Carbonate (CALCIUM 500 PO) Take 1 tablet by mouth every morning.   12/30/15  [provider]  calcium gluconate 500 MG tablet Take 500 mg by mouth daily.    01/10/12  [provider]  metoprolol succinate (TOPROL-XL) 25 MG 24 hr tablet Take 25 mg by mouth at bedtime.    01/10/12  [provider]  simvastatin (ZOCOR) 40 MG tablet Take 40 mg by mouth at bedtime.    01/10/12  [provider]    Current Facility-Administered Medications:  .  0.9 %  sodium chloride infusion, , Intravenous, Continuous,  Seals, Theo Dills, NP, Last Rate: 75 mL/hr at 05/17/19 0751 .  0.9 %  sodium chloride infusion, 250 mL, Intravenous, PRN, Seals, Theo Dills, NP .  acetaminophen (TYLENOL) tablet 650 mg, 650 mg, Oral, Q6H PRN **OR** acetaminophen (TYLENOL) suppository 650 mg, 650 mg, Rectal, Q6H PRN, Seals, Angela H, NP .  calcium gluconate 1 g/ 50 mL sodium chloride IVPB, 1 g, Intravenous, Once, Sainani, Belia Heman, MD .  levothyroxine (SYNTHROID) tablet 88 mcg, 88 mcg, Oral, QAC breakfast, Seals, Angela H, NP .  magnesium citrate solution 1 Bottle, 1 Bottle, Oral, Once, Kynsie Falkner, Tally Due, MD .  ondansetron (ZOFRAN) tablet 4 mg, 4 mg, Oral, Q6H PRN **OR** ondansetron (ZOFRAN) injection 4 mg, 4 mg, Intravenous, Q6H PRN, Seals, Angela H, NP .  pantoprazole (PROTONIX) injection 40 mg, 40 mg, Intravenous, Q12H, Seals, Angela H, NP, 40 mg at 05/17/19 0407 .  polyethylene glycol-electrolytes (NuLYTELY/GoLYTELY) solution 4,000 mL, 4,000 mL, Oral, Once,  Lin Landsman, MD .  pravastatin (PRAVACHOL) tablet 40 mg, 40 mg, Oral, Daily, Seals, Angela H, NP .  sodium chloride flush (NS) 0.9 % injection 3 mL, 3 mL, Intravenous, Q12H, Seals, Angela H, NP, 3 mL at 05/17/19 0439 .  sodium chloride flush (NS) 0.9 % injection 3 mL, 3 mL, Intravenous, PRN, Seals, Angela H, NP .  vitamin C (ASCORBIC ACID) tablet 2,000 mg, 2,000 mg, Oral, Daily, Seals, Theo Dills, NP  Family History  Problem Relation Age of Onset  . Transient ischemic attack Mother   . Hypertension Mother   . Stroke Mother        mini strokes  . Hypertension Sister   . Cancer Sister        ovarian  . Hypertension Sister   . Hypertension Sister   . Hypertension Sister   . Breast cancer Neg Hx   . Colon cancer Neg Hx      Social History   Tobacco Use  . Smoking status: Never Smoker  . Smokeless tobacco: Never Used  Substance Use Topics  . Alcohol use: No    Alcohol/week: 0.0 standard drinks  . Drug use: No    Allergies as of 05/16/2019 - Review  Complete 05/16/2019  Allergen Reaction Noted  . Atorvastatin  07/20/2010  . Ciprofloxacin  08/14/2007  . Epinephrine  04/26/2012  . Metoprolol succinate Swelling 03/16/2011  . Simvastatin Other (See Comments) 03/16/2011  . Sulfonamide derivatives  08/14/2007    Review of Systems:    All systems reviewed and negative except where noted in HPI.   Physical Exam:  Vital signs in last 24 hours: Temp:  [97.6 F (36.4 C)-98.8 F (37.1 C)] 97.6 F (36.4 C) (07/17 0328) Pulse Rate:  [72-108] 72 (07/17 0328) Resp:  [15-21] 16 (07/17 0328) BP: (101-166)/(55-82) 133/59 (07/17 1356) SpO2:  [95 %-100 %] 100 % (07/17 0328) Weight:  [56.2 kg-56.7 kg] 56.2 kg (07/17 0328) Last BM Date: 05/17/19 General:   Pleasant, cooperative in NAD, looks very young for her age Head:  Normocephalic and atraumatic. Eyes:   No icterus.   Conjunctiva pink. PERRLA. Ears:  Normal auditory acuity. Neck:  Supple; no masses or thyroidomegaly Lungs: Respirations even and unlabored. Lungs clear to auscultation bilaterally.   No wheezes, crackles, or rhonchi.  Heart:  Regular rate and rhythm;  Without murmur, clicks, rubs or gallops Abdomen:  Soft, nondistended, nontender. Normal bowel sounds. No appreciable masses or hepatomegaly.  No rebound or guarding.  Rectal: Revealed burgundy colored blood mixed with stool Msk:  Symmetrical without gross deformities.  Strength normal Extremities:  Without edema, cyanosis or clubbing. Neurologic:  Alert and oriented x3;  grossly normal neurologically. Skin:  Intact without significant lesions or rashes. Psych:  Alert and cooperative. Normal affect.  LAB RESULTS: CBC Latest Ref Rng & Units 05/17/2019 05/17/2019 05/16/2019  WBC 4.0 - 10.5 K/uL - 9.4 9.5  Hemoglobin 12.0 - 15.0 g/dL 12.5 12.3 12.5  Hematocrit 36.0 - 46.0 % 37.3 37.3 38.7  Platelets 150 - 400 K/uL - 129(L) 182    BMET BMP Latest Ref Rng & Units 05/17/2019 05/16/2019 01/05/2019  Glucose 70 - 99 mg/dL 116(H) 151(H)  132(H)  BUN 8 - 23 mg/dL _0 Creatinine 0.44 - 1.00 mg/dL 0.52 0.81 0.68  Sodium 135 - 145 mmol/L 142 138 140  Potassium 3.5 - 5.1 mmol/L 3.9 4.0 4.1  Chloride 98 - 111 mmol/L 111 103 104  CO2 22 - 32 mmol/L 24 28  28  Calcium 8.9 - 10.3 mg/dL 7.9(L) 9.0 9.0    LFT Hepatic Function Latest Ref Rng & Units 05/16/2019 10/02/2017 03/20/2017  Total Protein 6.5 - 8.1 g/dL 6.6 6.5 6.4(L)  Albumin 3.5 - 5.0 g/dL 4.1 4.3 3.9  AST 15 - 41 U/L _0 ALT 0 - 44 U/L 21 16 34  Alk Phosphatase 38 - 126 U/L 57 39 43  Total Bilirubin 0.3 - 1.2 mg/dL 0.7 0.4 0.5  Bilirubin, Direct 0.0 - 0.3 mg/dL - - -     STUDIES: Nm Gi Blood Loss  Result Date: 05/17/2019 CLINICAL DATA:  73 year old who had an episode of bright red blood per rectum last night and had an episode of dark red blood in her stool this morning. Patient became syncopal yesterday due to the amount of blood loss. Personal history of RIGHT lower lobectomy for lung cancer. EXAM: NUCLEAR MEDICINE GASTROINTESTINAL BLEEDING SCAN TECHNIQUE: Sequential abdominal images were obtained following intravenous administration of Tc-74mlabeled red blood cells. RADIOPHARMACEUTICALS:  21.9 mCi Tc-926mertechnetate in-vitro labeled red cells. COMPARISON:  No prior nuclear imaging. PET-CT 06/13/2018. CT abdomen and pelvis 06/07/2018 and earlier are correlated. FINDINGS: Very early activity is present in the LOWER pelvis in the expected location of the rectum which persists throughout the entire 2 hour imaging sequence. No abnormal activity is identified elsewhere. Expected activity in the blood pool, liver, spleen, an urinary tract is present. IMPRESSION: Active bleeding from the rectum is suspected, identified early in the imaging sequence and persistent throughout the 2 hours of imaging. Electronically Signed   By: ThEvangeline Dakin.D.   On: 05/17/2019 14:49      Impression / Plan:   [name] a [age].o. female with history of thyroid cancer,  lung cancer in remission admitted with painless hematochezia resulting in syncope status post units of PRBCs.  Currently her hemoglobin is normal and at baseline.  She is not actively bleeding at this time.  Nuclear medicine bleeding scan reported pooling of tracer activity in the rectum only.  Differentials include bleeding from hemorrhoids or Karen on ectal ulcer or left-sided diverticulosis or neoplasm or brisk upper GI bleed which is less likely.  No evidence of cirrhosis or chronic liver disease  Continue pantoprazole drip Okay with clear liquid diet N.p.o. past midnight Bowel prep ordered Monitor CBC every 8-12 hours Recommend EGD and colonoscopy, plan for tomorrow, patient is agreeable  I have discussed alternative options, risks & benefits,  which include, but are not limited to, bleeding, infection, perforation,respiratory complication & drug reaction.  The patient agrees with this plan & written consent will be obtained.     Thank you for involving me in the care of this patient.  Dr. TaBonna Gainso cover for the weekend    LOS: 0 days   RoSherri SearMD  05/17/2019, 3:36 PM   Note: This dictation was prepared with Dragon dictation along with smaller phrase technology. Any transcriptional errors that result from this process are unintentional.

## 2019-05-18 ENCOUNTER — Inpatient Hospital Stay: Admit: 2019-05-18 | Payer: PPO | Admitting: Gastroenterology

## 2019-05-18 ENCOUNTER — Encounter
Admission: EM | Disposition: A | Discharge status: Home / Self Care | Payer: Self-pay | Source: Home / Self Care | Attending: Internal Medicine

## 2019-05-18 ENCOUNTER — Inpatient Hospital Stay: Payer: PPO | Admitting: Anesthesiology

## 2019-05-18 DIAGNOSIS — K317 Polyp of stomach and duodenum: Secondary | ICD-10-CM

## 2019-05-18 DIAGNOSIS — K921 Melena: Principal | ICD-10-CM

## 2019-05-18 DIAGNOSIS — D122 Benign neoplasm of ascending colon: Secondary | ICD-10-CM

## 2019-05-18 HISTORY — PX: ESOPHAGOGASTRODUODENOSCOPY (EGD) WITH PROPOFOL: SHX5813

## 2019-05-18 HISTORY — PX: COLONOSCOPY WITH PROPOFOL: SHX5780

## 2019-05-18 LAB — TYPE AND SCREEN
ABO/RH(D): A POS
Antibody Screen: NEGATIVE
Unit division: 0
Unit division: 0

## 2019-05-18 LAB — HEMOGLOBIN AND HEMATOCRIT, BLOOD
HCT: 35.8 % — ABNORMAL LOW (ref 36.0–46.0)
HCT: 35.2 % — ABNORMAL LOW (ref 36.0–46.0)
HCT: 33.8 % — ABNORMAL LOW (ref 36.0–46.0)
Hemoglobin: 11.7 g/dL — ABNORMAL LOW (ref 12.0–15.0)
Hemoglobin: 11.3 g/dL — ABNORMAL LOW (ref 12.0–15.0)
Hemoglobin: 10.9 g/dL — ABNORMAL LOW (ref 12.0–15.0)

## 2019-05-18 LAB — BPAM RBC
Blood Product Expiration Date: 202008102359
Blood Product Expiration Date: 202008102359
ISSUE DATE / TIME: 202007162359
ISSUE DATE / TIME: 202007162359
Unit Type and Rh: 5100
Unit Type and Rh: 5100

## 2019-05-18 SURGERY — ESOPHAGOGASTRODUODENOSCOPY (EGD) WITH PROPOFOL
Anesthesia: General

## 2019-05-18 SURGERY — COLONOSCOPY WITH PROPOFOL
Anesthesia: Monitor Anesthesia Care

## 2019-05-18 SURGERY — EGD (ESOPHAGOGASTRODUODENOSCOPY)
Anesthesia: General

## 2019-05-18 MED ORDER — FENTANYL CITRATE (PF) 100 MCG/2ML IJ SOLN
INTRAMUSCULAR | Status: DC | PRN
  Administered 2019-05-18: 50 ug via INTRAVENOUS

## 2019-05-18 MED ORDER — PROPOFOL 500 MG/50ML IV EMUL
INTRAVENOUS | Status: DC | PRN
  Administered 2019-05-18: 100 ug/kg/min via INTRAVENOUS

## 2019-05-18 MED ORDER — PROPOFOL 10 MG/ML IV BOLUS
INTRAVENOUS | Status: AC
  Filled 2019-05-18: qty 20

## 2019-05-18 MED ORDER — PHENYLEPHRINE HCL (PRESSORS) 10 MG/ML IV SOLN
INTRAVENOUS | Status: AC
  Filled 2019-05-18: qty 1

## 2019-05-18 MED ORDER — FENTANYL CITRATE (PF) 100 MCG/2ML IJ SOLN
INTRAMUSCULAR | Status: AC
  Filled 2019-05-18: qty 2

## 2019-05-18 MED ORDER — PROPOFOL 500 MG/50ML IV EMUL
INTRAVENOUS | Status: AC
  Filled 2019-05-18: qty 50

## 2019-05-18 MED ORDER — EPHEDRINE SULFATE 50 MG/ML IJ SOLN
INTRAMUSCULAR | Status: DC | PRN
  Administered 2019-05-18 (×2): 5 mg via INTRAVENOUS

## 2019-05-18 MED ORDER — LACTATED RINGERS IV SOLN
INTRAVENOUS | Status: DC | PRN
  Administered 2019-05-18: 10:00:00 via INTRAVENOUS

## 2019-05-18 NOTE — Op Note (Signed)
[facility] Gastroenterology Patient Name: [name] Procedure Date: 05/18/2019 10:07 AM MRN: [id number] Account #: [id number] Date of Birth: 05/05/1946 Admit Type: Inpatient Age: 73 Room: [facility] ENDO ROOM 4 Gender: Female Note Status: Finalized Procedure:            Upper GI endoscopy Indications:          Hematochezia Providers:            Jamella Grayer B. Bonna Gains MD, MD Medicines:            Monitored Anesthesia Care Complications:        No immediate complications. Procedure:            Pre-Anesthesia Assessment:                       - Prior to the procedure, a History and Physical was                        performed, and patient medications, allergies and                        sensitivities were reviewed. The patient's tolerance of                        previous anesthesia was reviewed.                       - The risks and benefits of the procedure and the                        sedation options and risks were discussed with the                        patient. All questions were answered and informed                        consent was obtained.                       - Patient identification and proposed procedure were                        verified prior to the procedure by the physician, the                        nurse, the anesthesiologist, the anesthetist and the                        technician. The procedure was verified in the procedure                        room.                       - ASA Grade Assessment: II - A patient with mild                        systemic disease.                       After obtaining informed consent, the endoscope was  passed under direct vision. Throughout the procedure,                        the patient's blood pressure, pulse, and oxygen                        saturations were monitored continuously. The Endoscope                        was introduced through the mouth, and advanced to the                         second part of duodenum. The upper GI endoscopy was                        accomplished with ease. The patient tolerated the                        procedure well. Findings:      The examined esophagus was normal.      The entire examined stomach was normal.      A few 3 to 4 mm sessile polyps with no bleeding and no stigmata of       recent bleeding were found in the gastric body. Biopsies were not done       due to patient's recent GI bleeding.      The duodenal bulb, second portion of the duodenum and examined duodenum       were normal. Impression:           - Normal esophagus.                       - Normal stomach.                       - A few gastric polyps.                       - Normal duodenal bulb, second portion of the duodenum                        and examined duodenum.                       - No specimens collected.                       - The polyps are liokely benign fundic gland polyps and                        repeat EGD for biopsies can be discussed in clinic Recommendation:       - Continue present medications.                       - Patient has a contact number available for                        emergencies. The signs and symptoms of potential                        delayed complications were discussed with the patient.  Return to normal activities tomorrow. Written discharge                        instructions were provided to the patient.                       - The findings and recommendations were discussed with                        the patient.                       - The findings and recommendations were discussed with                        the patient's family.                       - d/c protonix Procedure Code(s):    --- Professional ---                       [phone], Esophagogastroduodenoscopy, flexible, transoral;                        diagnostic, including collection of specimen(s) by                         brushing or washing, when performed (separate procedure) Diagnosis Code(s):    --- Professional ---                       K31.7, Polyp of stomach and duodenum                       K92.1, Melena (includes Hematochezia) CPT copyright 2019 American Medical Association. All rights reserved. The codes documented in this report are preliminary and upon coder review may  be revised to meet current compliance requirements.  Vonda Antigua, MD Margretta Sidle B. Bonna Gains MD, MD 05/18/2019 10:22:42 AM This report has been signed electronically. Number of Addenda: 0 Note Initiated On: 05/18/2019 10:07 AM Estimated Blood Loss: Estimated blood loss: none.      [facility]

## 2019-05-18 NOTE — Plan of Care (Signed)

## 2019-05-18 NOTE — Op Note (Signed)
[facility] Gastroenterology Patient Name: [name] Procedure Date: 05/18/2019 10:03 AM MRN: [id number] Account #: [id number] Date of Birth: Feb 25, 1946 Admit Type: Inpatient Age: 73 Room: [facility] ENDO ROOM 4 Gender: Female Note Status: Finalized Procedure:            Colonoscopy Indications:          Hematochezia Providers:            Olufemi Mofield B. Bonna Gains MD, MD Medicines:            Monitored Anesthesia Care Complications:        No immediate complications. Procedure:            Pre-Anesthesia Assessment:                       - ASA Grade Assessment: II - A patient with mild                        systemic disease.                       - Prior to the procedure, a History and Physical was                        performed, and patient medications, allergies and                        sensitivities were reviewed. The patient's tolerance of                        previous anesthesia was reviewed.                       - The risks and benefits of the procedure and the                        sedation options and risks were discussed with the                        patient. All questions were answered and informed                        consent was obtained.                       - Patient identification and proposed procedure were                        verified prior to the procedure by the physician, the                        nurse, the anesthesiologist, the anesthetist and the                        technician. The procedure was verified in the procedure                        room.                       After obtaining informed consent, the colonoscope was  passed under direct vision. Throughout the procedure,                        the patient's blood pressure, pulse, and oxygen                        saturations were monitored continuously. The                        Colonoscope was introduced through the anus and     advanced to the the cecum, identified by appendiceal                        orifice and ileocecal valve. The colonoscopy was                        performed with ease. The patient tolerated the                        procedure well. The quality of the bowel preparation                        was good. Findings:      The perianal and digital rectal examinations were normal.      A 25 mm polyp was found in the ascending colon. The polyp was       semi-pedunculated. The polyp was removed with a hot snare. Resection and       retrieval were complete. To prevent bleeding after the polypectomy, two       hemostatic clips were successfully placed. There was no bleeding at the       end of the procedure. Area was tattooed with an injection of Spot       (carbon black). The tattoo was placed immediately distal to the polyp.       The polyp was large and retrieved completely via roth net.      As the large polyp was in the view of the camera during retrieval, small       polyps or lesions could have been missed during withdrawal. After       removal of the polyp from the colon the scope was reinserted and colon       visualized again.      A 4 mm polyp was found in the sigmoid colon. The polyp was sessile. The       polyp was removed with a jumbo cold forceps. Resection and retrieval       were complete.      The exam was otherwise without abnormality.      The retroflexed view of the distal rectum and anal verge was normal and       showed no anal or rectal abnormalities. Impression:           - One 25 mm polyp in the ascending colon, removed with                        a hot snare. Resected and retrieved. Clips were placed.                        Tattooed.                       -  The 25 mm polyp is the likely cause of the patient's                        symptoms. No rectal lesions to explain her bleeding or                        Bleeding scan were noted.                       - As the  large polyp was in the view of the camera                        during retrieval, small polyps or lesions could have                        been missed during withdrawal. After removal of the                        polyp from the colon the scope was reinserted and colon                        visualized again.                       - One 4 mm polyp in the sigmoid colon, removed with a                        jumbo cold forceps. Resected and retrieved.                       - The examination was otherwise normal.                       - The distal rectum and anal verge are normal on                        retroflexion view. Recommendation:       - Advance diet as tolerated.                       - Continue present medications.                       - Await pathology results.                       - Repeat colonoscopy within 3 months (to evaluate for                        small polyps or lesions that could have been missed                        during large polyp retrieval via roth net).                       - The findings and recommendations were discussed with                        the patient.                       -  The findings and recommendations were discussed with                        the patient's family.                       - Return to primary care physician as previously                        scheduled. Procedure Code(s):    --- Professional ---                       [phone], Colonoscopy, flexible; with removal of tumor(s),                        polyp(s), or other lesion(s) by snare technique                       [id number], Colonoscopy, flexible; with directed submucosal                        injection(s), any substance                       [id number], 59, Colonoscopy, flexible; with biopsy, single                        or multiple Diagnosis Code(s):    --- Professional ---                       K63.5, Polyp of colon                       K92.1, Melena (includes  Hematochezia) CPT copyright 2019 American Medical Association. All rights reserved. The codes documented in this report are preliminary and upon coder review may  be revised to meet current compliance requirements.  Vonda Antigua, MD Margretta Sidle B. Bonna Gains MD, MD 05/18/2019 11:18:57 AM This report has been signed electronically. Number of Addenda: 0 Note Initiated On: 05/18/2019 10:03 AM Scope Withdrawal Time: 0 hours 35 minutes 21 seconds  Total Procedure Duration: 0 hours 44 minutes 41 seconds  Estimated Blood Loss: Estimated blood loss: none.      [facility]

## 2019-05-18 NOTE — Transfer of Care (Signed)
Immediate Anesthesia Transfer of Care Note  Patient: [name]  Procedure(s) Performed: COLONOSCOPY WITH PROPOFOL (N/A ) ESOPHAGOGASTRODUODENOSCOPY (EGD) WITH PROPOFOL (N/A )  Patient Location: PACU  Anesthesia Type:MAC  Level of Consciousness: awake  Airway & Oxygen Therapy: Patient Spontanous Breathing  Post-op Assessment: Report given to RN  Post vital signs: stable  Last Vitals:  Vitals Value Taken Time  BP 105/52 05/18/19 1119  Temp 36.4 C 05/18/19 1119  Pulse 85 05/18/19 1119  Resp    SpO2 99 % 05/18/19 1119    Last Pain:  Vitals:   05/18/19 1119  TempSrc: Temporal  PainSc: 0-No pain         Complications: No apparent anesthesia complications

## 2019-05-18 NOTE — Progress Notes (Signed)
Shift summary:  - 0935 hrs: off unit for EGD, Colonoscopy  - 1210 hrs: Back on unit.

## 2019-05-18 NOTE — Anesthesia Preprocedure Evaluation (Addendum)
Anesthesia Evaluation  Patient identified by MRN, date of birth, ID band Patient awake    Reviewed: Allergy & Precautions, H&P , NPO status , reviewed documented beta blocker date and time   History of Anesthesia Complications (+) PONV and history of anesthetic complications  Airway Mallampati: II  TM Distance: >3 FB Neck ROM: full    Dental  (+) Caps   Pulmonary    Pulmonary exam normal        Cardiovascular hypertension, Normal cardiovascular exam     Neuro/Psych    GI/Hepatic GERD  ,  Endo/Other    Renal/GU      Musculoskeletal   Abdominal   Peds  Hematology   Anesthesia Other Findings Past Medical History: 10/01/2015: Adenocarcinoma of right lung (HCC) No date: GERD (gastroesophageal reflux disease) No date: Groin pain     Comment:  Along superior/laterl R inguinal canal.  Could be due to              hernia or old scar tissue.  prev with CT done w/o               alarming findings.  Will treat episodically.  Use tylenol              #3 prn.   No date: Hyperlipidemia No date: Lipoma of thigh     Comment:  Left 2014: Normal cardiac stress test No date: PONV (postoperative nausea and vomiting)     Comment:  nausea after hysterectomy, and 2 days after lung surgery              10/2015 12/2015: Thyroid cancer ([place]) No date: Thyroid nodule  Past Surgical History: 12-30-08: ABDOMINAL HYSTERECTOMY 11/23/2016: APPENDECTOMY     Comment:  Dr. Adonis Huguenin 1977: BREAST EXCISIONAL BIOPSY; Right 1977: BREAST MASS EXCISION     Comment:  benign 1978: CARPAL TUNNEL RELEASE; Right No date: CATARACT EXTRACTION, BILATERAL; Bilateral 11/23/2016: CHOLECYSTECTOMY     Comment:  Procedure: LAPAROSCOPIC CHOLECYSTECTOMY;  Surgeon:               Clayburn Pert, MD;  Location: [facility] ORS;  Service:               General;; 2009: COLONOSCOPY 2001: DILATION AND CURETTAGE OF UTERUS 08/25/2015: ELECTROMAGNETIC NAVIGATION BROCHOSCOPY;  N/A     Comment:  Procedure: ELECTROMAGNETIC NAVIGATION BRONCHOSCOPY;                Surgeon: Flora Lipps, MD;  Location: [facility] ORS;  Service:               Cardiopulmonary;  Laterality: N/A; 08/25/2015: ENDOBRONCHIAL ULTRASOUND; N/A     Comment:  Procedure: ENDOBRONCHIAL ULTRASOUND;  Surgeon: Flora Lipps, MD;  Location: [facility] ORS;  Service: Cardiopulmonary;              Laterality: N/A; 2002, 11/15/07: ESOPHAGOGASTRODUODENOSCOPY     Comment:  Normal (Dr. Allyn Kenner) 01/13/10: FACIAL COSMETIC SURGERY     Comment:  mini facelift 1976: HERNIA REPAIR 10/27/2015: THORACOTOMY/LOBECTOMY; Right     Comment:  Procedure: THORACOTOMY/LOBECTOMY;  Surgeon: Nestor Lewandowsky, MD;  Location: [facility] ORS;  Service: Thoracic;                Laterality: Right; 12/29/2015: THYROIDECTOMY; N/A     Comment:  Procedure: THYROIDECTOMY;  Surgeon: Beverly Gust, MD;  Location: [facility] ORS;  Service: ENT;  Laterality: N/A; 12/30/08: TOTAL VAGINAL HYSTERECTOMY     Comment:  for fibroid pain (Dr. Gertie Fey)  BMI    Body Mass Index: 21.27 kg/m      Reproductive/Obstetrics                            Anesthesia Physical Anesthesia Plan  ASA: II and emergent  Anesthesia Plan: General   Post-op Pain Management:    Induction: Intravenous  PONV Risk Score and Plan: Treatment may vary due to age or medical condition and TIVA  Airway Management Planned: Nasal Cannula and Natural Airway  Additional Equipment:   Intra-op Plan:   Post-operative Plan:   Informed Consent: I have reviewed the patients History and Physical, chart, labs and discussed the procedure including the risks, benefits and alternatives for the proposed anesthesia with the patient or authorized representative who has indicated his/her understanding and acceptance.     Dental Advisory Given  Plan Discussed with: CRNA  Anesthesia Plan Comments:        Anesthesia Quick Evaluation

## 2019-05-18 NOTE — Anesthesia Postprocedure Evaluation (Signed)
Anesthesia Post Note  Patient: Laveyah Oriol  Procedure(s) Performed: COLONOSCOPY WITH PROPOFOL (N/A ) ESOPHAGOGASTRODUODENOSCOPY (EGD) WITH PROPOFOL (N/A )  Patient location during evaluation: PACU Anesthesia Type: MAC Level of consciousness: awake and alert Pain management: pain level controlled Vital Signs Assessment: post-procedure vital signs reviewed and stable Respiratory status: spontaneous breathing, nonlabored ventilation and respiratory function stable Cardiovascular status: stable and blood pressure returned to baseline Postop Assessment: no apparent nausea or vomiting Anesthetic complications: no     Last Vitals:  Vitals:   05/18/19 1139 05/18/19 1151  BP: (!) 97/55 (!) 98/53  Pulse: 80 78  Resp:  17  Temp:    SpO2: 98% 98%    Last Pain:  Vitals:   05/18/19 1151  TempSrc:   PainSc: 0-No pain                 Alphonsus Sias

## 2019-05-18 NOTE — Progress Notes (Signed)
[place] [facility] [place] NAME: Rayvon Brandvold    MR#:  [id number]  [place]:  1946/03/20  SUBJECTIVE:   Patient presents to the hospital due to abdominal pain and having diarrhea which was bloody in nature.   Hemoglobin is stable Just got back from endoscopy  REVIEW OF SYSTEMS:    Review of Systems  Constitutional: Negative for chills and fever.  HENT: Negative for congestion and tinnitus.   Eyes: Negative for blurred vision and double vision.  Respiratory: Negative for cough, shortness of breath and wheezing.   Cardiovascular: Negative for chest pain, orthopnea and PND.  Gastrointestinal: Positive for abdominal pain and blood in stool. Negative for diarrhea, nausea and vomiting.  Genitourinary: Negative for dysuria and hematuria.  Neurological: Negative for dizziness, sensory change and focal weakness.  All other systems reviewed and are negative.   Nutrition:FLD Tolerating Diet:yes    DRUG ALLERGIES:   Allergies  Allergen Reactions  . Atorvastatin     REACTION: myalgias  . Ciprofloxacin     REACTION: increase reflux  . Epinephrine     Heart racing with dental injection  . Metoprolol Succinate Swelling    Swelling and stiffness in hands and ankles  . Simvastatin Other (See Comments)    Muscle aches and stiffness  . Sulfonamide Derivatives     REACTION: itching    VITALS:  Blood pressure (!) 98/53, pulse 78, temperature (!) 97.5 F (36.4 C), temperature source Temporal, resp. rate 17, height 5\' 4"  (1.626 m), weight 56.2 kg, SpO2 98 %.  PHYSICAL EXAMINATION:   Physical Exam  GENERAL:  73 y.o.-year-old patient lying in bed in no acute distress.  EYES: Pupils equal, round, reactive to light and accommodation. No scleral icterus. Extraocular muscles intact.  HEENT: Head atraumatic, normocephalic. Oropharynx and nasopharynx clear.  NECK:  Supple, no jugular venous distention. No thyroid enlargement, no tenderness.   LUNGS: Normal breath sounds bilaterally, no wheezing, rales, rhonchi. No use of accessory muscles of respiration.  CARDIOVASCULAR: S1, S2 normal. No murmurs, rubs, or gallops.  ABDOMEN: Soft, nontender, nondistended. Bowel sounds present. No organomegaly or mass.  EXTREMITIES: No cyanosis, clubbing or edema b/l.    NEUROLOGIC: Cranial nerves II through XII are intact. No focal Motor or sensory deficits b/l.   PSYCHIATRIC: The patient is alert and oriented x 3.  SKIN: No obvious rash, lesion, or ulcer.    LABORATORY PANEL:   CBC Recent Labs  Lab 05/17/19 0345  05/18/19 0939  WBC 9.4  --   --   HGB 12.3   < > 11.3*  HCT 37.3   < > 35.2*  PLT 129*  --   --    < > = values in this interval not displayed.   ------------------------------------------------------------------------------------------------------------------  Chemistries  Recent Labs  Lab 05/16/19 1950 05/17/19 0345  NA 138 142  K 4.0 3.9  CL 103 111  CO2 28 24  GLUCOSE 151* 116*  BUN 20 18  CREATININE 0.81 0.52  CALCIUM 9.0 7.9*  AST 19  --   ALT 21  --   ALKPHOS 57  --   BILITOT 0.7  --    ------------------------------------------------------------------------------------------------------------------  Cardiac Enzymes No results for input(s): TROPONINI in the last 168 hours. ------------------------------------------------------------------------------------------------------------------  RADIOLOGY:  Nm Gi Blood Loss  Result Date: 05/17/2019 CLINICAL DATA:  73 year old who had an episode of bright red blood per rectum last night and had an episode of dark red blood  in her stool this morning. Patient became syncopal yesterday due to the amount of blood loss. Personal history of RIGHT lower lobectomy for lung cancer. EXAM: NUCLEAR MEDICINE GASTROINTESTINAL BLEEDING SCAN TECHNIQUE: Sequential abdominal images were obtained following intravenous administration of Tc-52m labeled red blood cells.  RADIOPHARMACEUTICALS:  21.9 mCi Tc-5m pertechnetate in-vitro labeled red cells. COMPARISON:  No prior nuclear imaging. PET-CT 06/13/2018. CT abdomen and pelvis 06/07/2018 and earlier are correlated. FINDINGS: Very early activity is present in the LOWER pelvis in the expected location of the rectum which persists throughout the entire 2 hour imaging sequence. No abnormal activity is identified elsewhere. Expected activity in the blood pool, liver, spleen, an urinary tract is present. IMPRESSION: Active bleeding from the rectum is suspected, identified early in the imaging sequence and persistent throughout the 2 hours of imaging. Electronically Signed   By: Evangeline Dakin M.D.   On: 05/17/2019 14:49     ASSESSMENT AND PLAN:   73 year old female with past medical history of hypothyroidism, history of thyroid cancer, hyperlipidemia, GERD, history of lung cancer who presents to the hospital due to bloody diarrhea.  1.  GI bleed-suspected to be a lower GI bleed given the patient's rectal bleeding. - Given her multiple episodes of bleeding patient underwent a nuclear medicine GI bleeding scan which showed positive activity in the rectum.  Discussed with gastroenterology who did a rectal exam and the bladder is mostly maroon and old blood. -We will hold off on vascular surgery consult for now. -  EGD- Normal esophagus.                       - Normal stomach.                       - A few gastric polyps.                       - Normal duodenal bulb, second portion of the duodenum                        and examined duodenum.                       - No specimens collected - colonoscopy- One 25 mm polyp in the ascending colon, removed with                        a hot snare. Resected and retrieved. Clips were placed.                        Tattooed.                       - The 25 mm polyp is the likely cause of the patient's symptoms.   No rectal lesions to explain her bleeding or Bleeding scan were    noted   - One 4 mm polyp in the sigmoid colon, removed with a                        jumbo cold forceps. Resected and retrieved. -Continue Protonix -HGB stable. Monitor overnite  2.  Hypothyroidism-continue Synthroid.  3.  Hyperlipidemia-continue Pravachol.   All the records are reviewed and case discussed with Care Management/Social Worker. Management plans discussed with the patient  CODE STATUS: Full  code  DVT Prophylaxis: Ted's & SCD's   TOTAL TIME TAKING CARE OF THIS PATIENT: 30 minutes.   POSSIBLE D/C IN 1-2 DAYS, DEPENDING ON CLINICAL CONDITION.   Fritzi Mandes M.D on 05/18/2019 at 2:39 PM  Between 7am to 6pm - Pager - [phone]  After 6pm go to [email or web address] - Proofreader  [facility]  Office  [phone]  CC: Primary care physician; Tonia Ghent, MD

## 2019-05-18 NOTE — Anesthesia Post-op Follow-up Note (Signed)
Anesthesia QCDR form completed.        

## 2019-05-19 LAB — BASIC METABOLIC PANEL
Anion gap: 5 (ref 5–15)
BUN: 11 mg/dL (ref 8–23)
CO2: 24 mmol/L (ref 22–32)
Calcium: 7.7 mg/dL — ABNORMAL LOW (ref 8.9–10.3)
Chloride: 111 mmol/L (ref 98–111)
Creatinine, Ser: 0.5 mg/dL (ref 0.44–1.00)
GFR calc Af Amer: >60 mL/min (ref >60)
GFR calc non Af Amer: >60 mL/min (ref >60)
Glucose, Bld: 90 mg/dL (ref 70–99)
Potassium: 3.6 mmol/L (ref 3.5–5.1)
Sodium: 140 mmol/L (ref 135–145)

## 2019-05-19 LAB — HEMOGLOBIN AND HEMATOCRIT, BLOOD
HCT: 30.3 % — ABNORMAL LOW (ref 36.0–46.0)
Hemoglobin: 9.7 g/dL — ABNORMAL LOW (ref 12.0–15.0)

## 2019-05-19 NOTE — Discharge Summary (Signed)
[facility] NAME: [name]    MR#:  [id number]  DATE OF BIRTH:  [birth year]  DATE OF ADMISSION:  05/16/2019 ADMITTING PHYSICIAN: Christel Mormon, MD  DATE OF DISCHARGE: 05/19/2019  PRIMARY CARE PHYSICIAN: Tonia Ghent, MD    ADMISSION DIAGNOSIS:  Lower GI bleed [K92.2]  DISCHARGE DIAGNOSIS:  Lower Gi bleed suspected from the large polyp (per GI)  SECONDARY DIAGNOSIS:   Past Medical History:  Diagnosis Date  . 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 GERD (gastroesophageal reflux disease)   . Groin pain    Along superior/laterl R inguinal canal.  Could be due to hernia or old scar tissue.  prev with CT done w/o alarming findings.  Will treat episodically.  Use tylenol #3 prn.    . Hyperlipidemia   . Lipoma of thigh    Left  . Normal cardiac stress test 2014  . PONV (postoperative nausea and vomiting)    nausea after hysterectomy, and 2 days after lung surgery 10/2015  . Thyroid cancer ([place]) 12/2015  . Thyroid nodule     HOSPITAL COURSE:   73 year old female with past medical history of hypothyroidism, history of thyroid cancer, hyperlipidemia, GERD, history of lung cancer who presents to the hospital due to bloody diarrhea.  1.GI bleed-suspected to be a lower GI bleed given the patient's rectal bleeding. - Given her multiple episodes of bleeding patient underwent a nuclear medicine GI bleeding scan which showed positive activity in the rectum.  Discussed with gastroenterology who did a rectal exam and the bladder is mostly maroon and old blood. -  EGD- Normal esophagus. - Normal stomach. - A few gastric polyps. - Normal duodenal bulb, second portion of the duodenum  and examined duodenum. - No specimens collected - colonoscopy- One 25 mm polyp in the ascending colon, removed with   a hot snare. Resected and retrieved. Clips were placed.  Tattooed. - The 25 mm polyp is the likely cause of the patient's symptoms.  No rectal lesions to explain her bleeding or Bleeding scan were                     noted- One 4 mm polyp in the sigmoid colon, removed with a  jumbo cold forceps. Resected and retrieved. -Continue Protonix -HGB stable. Monitor overnite, no further rectal bleeding -tolerating soft diet.  2.  Hypothyroidism-continue Synthroid.  3.  Hyperlipidemia-continue Pravachol.  Will discharge patient with outpatient follow-up with Dr. Bonna Gains in about a week to 10 days discussed path results. Patient is agreeable with the plan. G.I. is okay with the plan as well.  CONSULTS OBTAINED:  Treatment Team:  Jonathon Bellows, MD Virgel Manifold, MD  DRUG ALLERGIES:   Allergies  Allergen Reactions  . Atorvastatin     REACTION: myalgias  . Ciprofloxacin     REACTION: increase reflux  . Epinephrine     Heart racing with dental injection  . Metoprolol Succinate Swelling    Swelling and stiffness in hands and ankles  . Simvastatin Other (See Comments)    Muscle aches and stiffness  . Sulfonamide Derivatives     REACTION: itching    DISCHARGE MEDICATIONS:   Allergies as of 05/19/2019      Reactions   Atorvastatin    REACTION: myalgias   Ciprofloxacin    REACTION: increase reflux   Epinephrine    Heart racing with dental injection   Metoprolol Succinate Swelling  Swelling and stiffness in hands and ankles   Simvastatin Other (See Comments)   Muscle aches and stiffness   Sulfonamide Derivatives    REACTION: itching      Medication List    TAKE these medications   CALCIUM-MAG-VIT C-VIT D PO Take by mouth daily.   Coenzyme Q10 200 MG capsule Take 200 mg by mouth daily.   FA-VITAMIN B-6-VITAMIN B-12 PO Take 1 tablet by mouth daily after breakfast. Reported  on 02/04/2016   levothyroxine 88 MCG tablet Commonly known as: SYNTHROID Take 1 tablet (88 mcg total) by mouth daily before breakfast.   Lutein 20 MG Caps Take 20 mg by mouth daily.   omeprazole 40 MG capsule Commonly known as: PRILOSEC TAKE 1 CAPSULE BY MOUTH EVERY DAY   PA Biotin 1000 MCG tablet Generic drug: Biotin Take 1,000 mcg by mouth daily. Reported on 02/04/2016   Potassium Gluconate 595 MG Caps Take 595 mg by mouth daily.   pravastatin 40 MG tablet Commonly known as: PRAVACHOL Take 1 tablet (40 mg total) by mouth daily.   Super Omega 3 EPA/DHA 1000 MG Caps Take 1,000 mg by mouth daily with supper.   vitamin C 500 MG tablet Commonly known as: ASCORBIC ACID Take 2,000 mg by mouth daily. Reported on 02/04/2016   Vitamin D-3 25 MCG (1000 UT) Caps Take 1 capsule by mouth daily with supper.       If you experience worsening of your admission symptoms, develop shortness of breath, life threatening emergency, suicidal or homicidal thoughts you must seek medical attention immediately by calling 911 or calling your MD immediately  if symptoms less severe.  You Must read complete instructions/literature along with all the possible adverse reactions/side effects for all the Medicines you take and that have been prescribed to you. Take any new Medicines after you have completely understood and accept all the possible adverse reactions/side effects.   Please note  You were cared for by a hospitalist during your hospital stay. If you have any questions about your discharge medications or the care you received while you were in the hospital after you are discharged, you can call the unit and asked to speak with the hospitalist on call if the hospitalist that took care of you is not available. Once you are discharged, your primary care physician will handle any further medical issues. Please note that NO REFILLS for any discharge medications will be authorized once you are discharged,  as it is imperative that you return to your primary care physician (or establish a relationship with a primary care physician if you do not have one) for your aftercare needs so that they can reassess your need for medications and monitor your lab values. Today   SUBJECTIVE   No rectal bleed. Tolerating soft diet.  VITAL SIGNS:  Blood pressure 119/60, pulse 64, temperature 97.9 F (36.6 C), temperature source Oral, resp. rate 20, height 5\' 4"  (1.626 m), weight 56.2 kg, SpO2 99 %.  I/O:    Intake/Output Summary (Last 24 hours) at 05/19/2019 0916 Last data filed at 05/18/2019 1847 Gross per 24 hour  Intake 2051.53 ml  Output -  Net 2051.53 ml    PHYSICAL EXAMINATION:  GENERAL:  73 y.o.-year-old patient lying in the bed with no acute distress.  EYES: Pupils equal, round, reactive to light and accommodation. No scleral icterus. Extraocular muscles intact.  HEENT: Head atraumatic, normocephalic. Oropharynx and nasopharynx clear.  NECK:  Supple, no jugular venous distention. No thyroid enlargement, no  tenderness.  LUNGS: Normal breath sounds bilaterally, no wheezing, rales,rhonchi or crepitation. No use of accessory muscles of respiration.  CARDIOVASCULAR: S1, S2 normal. No murmurs, rubs, or gallops.  ABDOMEN: Soft, non-tender, non-distended. Bowel sounds present. No organomegaly or mass.  EXTREMITIES: No pedal edema, cyanosis, or clubbing.  NEUROLOGIC: Cranial nerves II through XII are intact. Muscle strength 5/5 in all extremities. Sensation intact. Gait not checked.  PSYCHIATRIC: The patient is alert and oriented x 3.  SKIN: No obvious rash, lesion, or ulcer.   DATA REVIEW:   CBC  Recent Labs  Lab 05/17/19 0345  05/19/19 0117  WBC 9.4  --   --   HGB 12.3   < > 9.7*  HCT 37.3   < > 30.3*  PLT 129*  --   --    < > = values in this interval not displayed.    Chemistries  Recent Labs  Lab 05/16/19 1950  05/19/19 0117  NA 138   < > 140  K 4.0   < > 3.6  CL 103   < > 111   CO2 28   < > 24  GLUCOSE 151*   < > 90  BUN 20   < > 11  CREATININE 0.81   < > 0.50  CALCIUM 9.0   < > 7.7*  AST 19  --   --   ALT 21  --   --   ALKPHOS 57  --   --   BILITOT 0.7  --   --    < > = values in this interval not displayed.    Microbiology Results   Recent Results (from the past 240 hour(s))  SARS Coronavirus 2 (CEPHEID - Performed in [facility] hospital lab), Hosp Order     Status: None   Collection Time: 05/16/19 10:32 PM   Specimen: Nasopharyngeal Swab  Result Value Ref Range Status   SARS Coronavirus 2 NEGATIVE NEGATIVE Final    Comment: (NOTE) If result is NEGATIVE SARS-CoV-2 target nucleic acids are NOT DETECTED. The SARS-CoV-2 RNA is generally detectable in upper and lower  respiratory specimens during the acute phase of infection. The lowest  concentration of SARS-CoV-2 viral copies this assay can detect is 250  copies / mL. A negative result does not preclude SARS-CoV-2 infection  and should not be used as the sole basis for treatment or other  patient management decisions.  A negative result may occur with  improper specimen collection / handling, submission of specimen other  than nasopharyngeal swab, presence of viral mutation(s) within the  areas targeted by this assay, and inadequate number of viral copies  (<250 copies / mL). A negative result must be combined with clinical  observations, patient history, and epidemiological information. If result is POSITIVE SARS-CoV-2 target nucleic acids are DETECTED. The SARS-CoV-2 RNA is generally detectable in upper and lower  respiratory specimens dur ing the acute phase of infection.  Positive  results are indicative of active infection with SARS-CoV-2.  Clinical  correlation with patient history and other diagnostic information is  necessary to determine patient infection status.  Positive results do  not rule out bacterial infection or co-infection with other viruses. If result is PRESUMPTIVE  POSTIVE SARS-CoV-2 nucleic acids MAY BE PRESENT.   A presumptive positive result was obtained on the submitted specimen  and confirmed on repeat testing.  While 2019 novel coronavirus  (SARS-CoV-2) nucleic acids may be present in the submitted sample  additional confirmatory testing may be  necessary for epidemiological  and / or clinical management purposes  to differentiate between  SARS-CoV-2 and other Sarbecovirus currently known to infect humans.  If clinically indicated additional testing with an alternate test  methodology [phone]) is advised. The SARS-CoV-2 RNA is generally  detectable in upper and lower respiratory sp ecimens during the acute  phase of infection. The expected result is Negative. Fact Sheet for Patients:  StrictlyIdeas.no Fact Sheet for Healthcare Providers: BankingDealers.co.za This test is not yet approved or cleared by the [place] FDA and has been authorized for detection and/or diagnosis of SARS-CoV-2 by FDA under an Emergency Use Authorization (EUA).  This EUA will remain in effect (meaning this test can be used) for the duration of the COVID-19 declaration under Section 564(b)(1) of the Act, 21 U.S.C. section 360bbb-3(b)(1), unless the authorization is terminated or revoked sooner. Performed at [facility], [address] [place], [place] [address]     RADIOLOGY:  Nm Gi Blood Loss  Result Date: 05/17/2019 CLINICAL DATA:  73 year old who had an episode of bright red blood per rectum last night and had an episode of dark red blood in her stool this morning. Patient became syncopal yesterday due to the amount of blood loss. Personal history of RIGHT lower lobectomy for lung cancer. EXAM: NUCLEAR MEDICINE GASTROINTESTINAL BLEEDING SCAN TECHNIQUE: Sequential abdominal images were obtained following intravenous administration of Tc-86m labeled red blood cells. RADIOPHARMACEUTICALS:  21.9 mCi  Tc-82m pertechnetate in-vitro labeled red cells. COMPARISON:  No prior nuclear imaging. PET-CT 06/13/2018. CT abdomen and pelvis 06/07/2018 and earlier are correlated. FINDINGS: Very early activity is present in the LOWER pelvis in the expected location of the rectum which persists throughout the entire 2 hour imaging sequence. No abnormal activity is identified elsewhere. Expected activity in the blood pool, liver, spleen, an urinary tract is present. IMPRESSION: Active bleeding from the rectum is suspected, identified early in the imaging sequence and persistent throughout the 2 hours of imaging. Electronically Signed   By: Evangeline Dakin M.D.   On: 05/17/2019 14:49     CODE STATUS:     Code Status Orders  (From admission, onward)         Start     Ordered   05/17/19 0331  Full code  Continuous     05/17/19 0331        Code Status History    Date Active Date Inactive Code Status Order ID Comments User Context   03/20/2017 2020 03/21/2017 1752 Full Code [id number]  Nicholes Mango, MD ED   12/29/2015 1050 12/30/2015 1323 Full Code [id number]  Beverly Gust, MD Inpatient   10/27/2015 1809 11/03/2015 1956 Full Code [id number]  Nestor Lewandowsky, MD Inpatient   09/21/2015 1018 09/22/2015 0325 Full Code [id number]  Sabino Dick, MD HOV   09/08/2015 0956 09/09/2015 0324 Full Code [id number]  Inez Catalina, MD [facility]   Advance Care Planning Activity    Advance Directive Documentation     Most Recent Value  Type of Advance Directive  Healthcare Power of Attorney, Living will  Pre-existing out of facility DNR order (yellow form or pink MOST form)  -  "MOST" Form in Place?  -      TOTAL TIME TAKING CARE OF THIS PATIENT: *40* minutes.    Fritzi Mandes M.D on 05/19/2019 at 9:16 AM  Between 7am to 6pm - Pager - [phone] After 6pm go to [email or web address] - password [email or web address]  Office  [phone]  CC: Primary care physician;  Tonia Ghent, MD

## 2019-05-19 NOTE — Progress Notes (Signed)
Pt discharged per MD order. IV removed. Discharge instructions reviewed with pt. Pt verbalized understanding with all questions answered to satisfaction. Pt taken to car in wheelchair.

## 2019-05-20 ENCOUNTER — Telehealth: Payer: Self-pay | Admitting: Gastroenterology

## 2019-05-20 ENCOUNTER — Telehealth: Payer: Self-pay

## 2019-05-20 ENCOUNTER — Encounter: Payer: Self-pay | Admitting: Gastroenterology

## 2019-05-20 NOTE — Telephone Encounter (Signed)
Transition Care Management Follow-up Telephone Call   Date discharged? 05/19/19   How have you been since you were released from the hospital? Doing well, just a lot of gas and pressure but they say that is normal after my procedure.    Do you understand why you were in the hospital? Yes   Do you understand the discharge instructions? Yes   Where were you discharged to? Home   Items Reviewed:  Medications reviewed: Yes       Allergies reviewed: yes  Dietary changes reviewed: YES  Referrals reviewed: Yes, Gi this Thursday, Dr. Damita Dunnings next Monday.    Functional Questionnaire:   Activities of Daily Living (ADLs):   She states they are independent in the following: dressing, bathing, cooking, toileting, ambulating.  States they require assistance with the following: Requires no assistance with ADL's   Any transportation issues/concerns?: No   Any patient concerns?  None   Confirmed importance and date/time of follow-up visits scheduled Yes  Provider Appointment booked with Dr. Damita Dunnings on Monday 05/27/19.   Confirmed with patient if condition begins to worsen call PCP or go to the ER.  Patient was given the office number and encouraged to call back with question or concerns.  : YES

## 2019-05-20 NOTE — Telephone Encounter (Signed)
Pt is calling to schedule 1 week ed f/u  I offered 08/03 for Virtual visit she states she would prefer in person and [facility] in person is 06/11/19 pt was worried that be to far out please advise

## 2019-05-21 LAB — SURGICAL PATHOLOGY

## 2019-05-21 NOTE — Telephone Encounter (Signed)
Noted. Thanks.

## 2019-05-23 ENCOUNTER — Ambulatory Visit (INDEPENDENT_AMBULATORY_CARE_PROVIDER_SITE_OTHER): Payer: PPO | Admitting: Gastroenterology

## 2019-05-23 ENCOUNTER — Other Ambulatory Visit: Payer: Self-pay

## 2019-05-23 ENCOUNTER — Encounter: Payer: Self-pay | Admitting: Gastroenterology

## 2019-05-23 VITALS — BP 153/82 | HR 80 | Temp 98.0°F | Ht 64.0 in | Wt 122.2 lb

## 2019-05-23 DIAGNOSIS — K317 Polyp of stomach and duodenum: Secondary | ICD-10-CM

## 2019-05-23 DIAGNOSIS — D122 Benign neoplasm of ascending colon: Secondary | ICD-10-CM | POA: Diagnosis not present

## 2019-05-23 NOTE — Progress Notes (Signed)
Karen Antigua, MD [address]  [place]  [place], [place] [address]  Main: [phone]  Fax: [phone]   Primary Care Physician: Tonia Ghent, MD  CC: Follow-up post hospitalization for hematochezia and large colon polyp  HPI: Kirsten Spearing is a 73 y.o. female who presented to the hospital with hematochezia earlier this month, hemodynamically stable with hemoglobin of 12.5.  No prior episodes of GI bleed.  She underwent EGD and colonoscopy on May 18, 2019.  EGD showed few gastric polyps and was otherwise normal.  Biopsies were not done of the polyps due to patient's bleeding during the admission.  Colonoscopy showed a large 25 mm polyp in the ascending colon, semi-pedunculated, removed completely with a hot snare and clips were placed to prevent bleeding.  A tattoo was placed immediately distal to the polyp as well.  4 mm sigmoid polyp was also seen and removed with jumbo forceps.  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  Negative for high-grade dysplasia and malignancy.  Sigmoid colon polyp showed tubular adenoma.  Polyp size on pathology noted to be 3.7 cm in largest size  The patient denies abdominal or flank pain, anorexia, nausea or vomiting, dysphagia, change in bowel habits or black or bloody stools or weight loss.     Current Outpatient Medications  Medication Sig Dispense Refill   Ascorbic Acid (VITAMIN C) 500 MG tablet Take 2,000 mg by mouth daily. Reported on 02/04/2016     Biotin (PA BIOTIN) 1000 MCG tablet Take 1,000 mcg by mouth daily. Reported on 02/04/2016     CALCIUM-MAG-VIT C-VIT D PO Take by mouth daily.     Cholecalciferol (VITAMIN D-3) 1000 units CAPS Take 1 capsule by mouth daily with supper.      Coenzyme Q10 200 MG capsule Take 200 mg by mouth daily.     Folic Acid-Vit B4-WHQ P59 (FA-VITAMIN B-6-VITAMIN B-12 PO) Take 1 tablet by mouth daily after breakfast.  Reported on 02/04/2016     levothyroxine (SYNTHROID, LEVOTHROID) 88 MCG tablet Take 1 tablet (88 mcg total) by mouth daily before breakfast. 90 tablet 3   Lutein 20 MG CAPS Take 20 mg by mouth daily.     Omega-3 Fatty Acids (SUPER OMEGA 3 EPA/DHA) 1000 MG CAPS Take 1,000 mg by mouth daily with supper.     omeprazole (PRILOSEC) 40 MG capsule TAKE 1 CAPSULE BY MOUTH EVERY DAY 90 capsule 1   Potassium Gluconate 595 MG CAPS Take 595 mg by mouth daily.      pravastatin (PRAVACHOL) 40 MG tablet Take 1 tablet (40 mg total) by mouth daily.     No current facility-administered medications for this visit.     Allergies as of 05/23/2019 - Review Complete 05/20/2019  Allergen Reaction Noted   Atorvastatin  07/20/2010   Ciprofloxacin  08/14/2007   Epinephrine  04/26/2012   Metoprolol succinate Swelling 03/16/2011   Simvastatin Other (See Comments) 03/16/2011   Sulfonamide derivatives  08/14/2007    ROS:  General: Negative for anorexia, weight loss, fever, chills, fatigue, weakness. ENT: Negative for hoarseness, difficulty swallowing , nasal congestion. CV: Negative for chest pain, angina, palpitations, dyspnea on exertion, peripheral edema.  Respiratory: Negative for dyspnea at rest, dyspnea on exertion, cough, sputum, wheezing.  GI: See history of present illness. GU:  Negative for dysuria, hematuria, urinary incontinence, urinary frequency, nocturnal urination.  Endo: Negative for unusual weight change.    Physical Examination:  Vitals:   05/23/19 1216  BP: (!) 153/82  Pulse: 80  Temp: 98 F (36.7 C)  Weight: 122 lb 3.2 oz (55.4 kg)  Height: 5\' 4"  (1.626 m)     General: Well-nourished, well-developed in no acute distress.  Eyes: No icterus. Conjunctivae pink. Mouth: Oropharyngeal mucosa moist and pink , no lesions erythema or exudate. Neck: Supple, Trachea midline Abdomen: Bowel sounds are normal, nontender, nondistended, no hepatosplenomegaly or masses, no abdominal  bruits or hernia , no rebound or guarding.   Extremities: No lower extremity edema. No clubbing or deformities. Neuro: Alert and oriented x 3.  Grossly intact. Skin: Warm and dry, no jaundice.   Psych: Alert and cooperative, normal mood and affect.   Labs: CMP     Component Value Date/Time   NA 140 05/19/2019 0117   NA 141 07/01/2013 0103   K 3.6 05/19/2019 0117   K 3.8 07/01/2013 0103   CL 111 05/19/2019 0117   CL 110 (H) 07/01/2013 0103   CO2 24 05/19/2019 0117   CO2 27 07/01/2013 0103   GLUCOSE 90 05/19/2019 0117   GLUCOSE 88 07/01/2013 0103   BUN 11 05/19/2019 0117   BUN 11 07/01/2013 0103   CREATININE 0.50 05/19/2019 0117   CREATININE 0.68 07/01/2013 0103   CALCIUM 7.7 (L) 05/19/2019 0117   CALCIUM 8.2 (L) 07/01/2013 0103   PROT 6.6 05/16/2019 1950   PROT 5.5 (L) 07/01/2013 0103   ALBUMIN 4.1 05/16/2019 1950   ALBUMIN 3.0 (L) 07/01/2013 0103   AST 19 05/16/2019 1950   AST 19 07/01/2013 0103   ALT 21 05/16/2019 1950   ALT 24 07/01/2013 0103   ALKPHOS 57 05/16/2019 1950   ALKPHOS 55 07/01/2013 0103   BILITOT 0.7 05/16/2019 1950   BILITOT 0.7 07/01/2013 0103   GFRNONAA >60 05/19/2019 0117   GFRNONAA >60 07/01/2013 0103   GFRAA >60 05/19/2019 0117   GFRAA >60 07/01/2013 0103   Lab Results  Component Value Date   WBC 9.4 05/17/2019   HGB 9.7 (L) 05/19/2019   HCT 30.3 (L) 05/19/2019   MCV 90.8 05/17/2019   PLT 129 (L) 05/17/2019    Imaging Studies: Nm Gi Blood Loss  Result Date: 05/17/2019 CLINICAL DATA:  73 year old who had an episode of bright red blood per rectum last night and had an episode of dark red blood in her stool this morning. Patient became syncopal yesterday due to the amount of blood loss. Personal history of RIGHT lower lobectomy for lung cancer. EXAM: NUCLEAR MEDICINE GASTROINTESTINAL BLEEDING SCAN TECHNIQUE: Sequential abdominal images were obtained following intravenous administration of Tc-61m labeled red blood cells. RADIOPHARMACEUTICALS:   21.9 mCi Tc-22m pertechnetate in-vitro labeled red cells. COMPARISON:  No prior nuclear imaging. PET-CT 06/13/2018. CT abdomen and pelvis 06/07/2018 and earlier are correlated. FINDINGS: Very early activity is present in the LOWER pelvis in the expected location of the rectum which persists throughout the entire 2 hour imaging sequence. No abnormal activity is identified elsewhere. Expected activity in the blood pool, liver, spleen, an urinary tract is present. IMPRESSION: Active bleeding from the rectum is suspected, identified early in the imaging sequence and persistent throughout the 2 hours of imaging. Electronically Signed   By: Evangeline Dakin M.D.   On: 05/17/2019 14:49    Assessment and Plan:   Danilyn Cocke is a 72 y.o. y/o female with hematochezia during hospital admission earlier in July 2020  Source of hematochezia was a large colon polyp in the ascending colon that was completely  removed  It is free of malignancy and cauterized base is free of adenomatous change  Would recommend repeat colonoscopy within 3 to 6 months given that the large polyp affected the the view during retrieval due to need for Jabier Mutton net during polyp retrieval  Repeat EGD also discussed due to need for biopsies gastric polyps  Patient agreeable  No further episodes of bleeding since hospital discharge  I have discussed alternative options, risks & benefits,  which include, but are not limited to, bleeding, infection, perforation,respiratory complication & drug reaction.  The patient agrees with this plan & written consent will be obtained.     Dr [name]

## 2019-05-27 ENCOUNTER — Ambulatory Visit (INDEPENDENT_AMBULATORY_CARE_PROVIDER_SITE_OTHER): Payer: PPO | Admitting: Family Medicine

## 2019-05-27 ENCOUNTER — Other Ambulatory Visit: Payer: Self-pay

## 2019-05-27 ENCOUNTER — Encounter: Payer: Self-pay | Admitting: Family Medicine

## 2019-05-27 VITALS — BP 102/72 | HR 84 | Temp 98.1°F | Ht 64.0 in | Wt 123.1 lb

## 2019-05-27 DIAGNOSIS — K922 Gastrointestinal hemorrhage, unspecified: Secondary | ICD-10-CM | POA: Diagnosis not present

## 2019-05-27 LAB — CBC WITH DIFFERENTIAL/PLATELET
Basophils Absolute: 0 10*3/uL (ref 0.0–0.1)
Basophils Relative: 0.8 % (ref 0.0–3.0)
Eosinophils Absolute: 0.1 10*3/uL (ref 0.0–0.7)
Eosinophils Relative: 1.1 % (ref 0.0–5.0)
HCT: 36.8 % (ref 36.0–46.0)
Hemoglobin: 12.3 g/dL (ref 12.0–15.0)
Lymphocytes Relative: 24.1 % (ref 12.0–46.0)
Lymphs Abs: 1.5 10*3/uL (ref 0.7–4.0)
MCHC: 33.6 g/dL (ref 30.0–36.0)
MCV: 91.6 fl (ref 78.0–100.0)
Monocytes Absolute: 0.4 10*3/uL (ref 0.1–1.0)
Monocytes Relative: 6.7 % (ref 3.0–12.0)
Neutro Abs: 4.1 10*3/uL (ref 1.4–7.7)
Neutrophils Relative %: 67.3 % (ref 43.0–77.0)
Platelets: 171 10*3/uL (ref 150.0–400.0)
RBC: 4.01 Mil/uL (ref 3.87–5.11)
RDW: 14.6 % (ref 11.5–15.5)
WBC: 6 10*3/uL (ref 4.0–10.5)

## 2019-05-27 LAB — BASIC METABOLIC PANEL
BUN: 17 mg/dL (ref 6–23)
CO2: 29 mEq/L (ref 19–32)
Calcium: 9.1 mg/dL (ref 8.4–10.5)
Chloride: 104 mEq/L (ref 96–112)
Creatinine, Ser: 0.71 mg/dL (ref 0.40–1.20)
GFR: 80.25 mL/min (ref >60.00)
Glucose, Bld: 111 mg/dL — ABNORMAL HIGH (ref 70–99)
Potassium: 4.2 mEq/L (ref 3.5–5.1)
Sodium: 140 mEq/L (ref 135–145)

## 2019-05-27 MED ORDER — OMEPRAZOLE 40 MG PO CPDR: DELAYED_RELEASE_CAPSULE | ORAL | 3 refills | Status: DC

## 2019-05-27 NOTE — Patient Instructions (Signed)
Go to the lab on the way out.  We'll contact you with your lab report. Take care.  Glad to see you.  Update me as needed.   

## 2019-05-27 NOTE — Progress Notes (Signed)
  ADMISSION DIAGNOSIS:  Lower GI bleed [K92.2]  DISCHARGE DIAGNOSIS:  Lower Gi bleed suspected from the large polyp (per GI)  SECONDARY DIAGNOSIS:       Past Medical History:  Diagnosis Date  . 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 GERD (gastroesophageal reflux disease)   . Groin pain    Along superior/laterl R inguinal canal.  Could be due to hernia or old scar tissue.  prev with CT done w/o alarming findings.  Will treat episodically.  Use tylenol #3 prn.    . Hyperlipidemia   . Lipoma of thigh    Left  . Normal cardiac stress test 2014  . PONV (postoperative nausea and vomiting)    nausea after hysterectomy, and 2 days after lung surgery 10/2015  . Thyroid cancer ([place]) 12/2015  . Thyroid nodule     HOSPITAL COURSE:   73 year old female with past medical history of hypothyroidism, history of thyroid cancer, hyperlipidemia, GERD, history of lung cancer who presents to the hospital due to bloody diarrhea.  1.GI bleed-suspected to be a lower GI bleed given the patient's rectal bleeding. -Given her multiple episodes of bleeding patient underwent a nuclear medicine GI bleeding scan which showed positive activity in the rectum. Discussed with gastroenterology who did a rectal exam and the bladder is mostly maroon and old blood. -EGD- Normal esophagus. - Normal stomach. - A few gastric polyps. - Normal duodenal bulb, second portion of the duodenum  and examined duodenum. - No specimens collected -colonoscopy- One 25 mm polyp in the ascending colon, removed with  a hot snare. Resected and retrieved. Clips were placed.  Tattooed. - The 25 mm polyp is the likely cause of the patient's symptoms.No rectal lesions to explain her bleeding or Bleeding scan were  noted- One 4 mm polyp in the sigmoid colon, removed with a  jumbo cold forceps. Resected and retrieved. -Continue Protonix -HGB stable. Monitor overnite, no further rectal bleeding -tolerating soft diet.  2. Hypothyroidism-continue Synthroid.  3. Hyperlipidemia-continue Pravachol.  Will discharge patient with outpatient follow-up with Dr. Bonna Gains in about a week to 10 days discussed path results. Patient is agreeable with the plan. G.I. is okay with the plan as well. ========================== Hospital follow-up.  Admitted with GI bleed.  Inpatient course discussed with patient.  She had follow-up with GI in the meantime.  Previous GI notes discussed with patient about repeat colonoscopy given the size of the polyp.  No bleeding in the meantime.  No fevers chills nausea vomiting.  No new abdominal symptoms.  She feels better overall.  Follow-up labs are pending.  She still on her baseline cholesterol and thyroid medication.  She has had some occ L shoulder blade pain w/o clear dx prev.  She does not have exertional symptoms.  She is talked to multiple docs about this previously.  No new symptoms.  We talked about options and she'll monitor for now, this is reasonable.  No abd pain now.  Not lightheaded.  No blood in stool.   PMH and SH reviewed  ROS: Per HPI unless specifically indicated in ROS section   Meds, vitals, and allergies reviewed.   GEN: nad, alert and oriented HEENT: ncat NECK: supple w/o LA CV: rrr. PULM: ctab, no inc wob ABD: soft, +bs EXT: no edema SKIN: no acute rash

## 2019-05-28 ENCOUNTER — Encounter: Payer: Self-pay | Admitting: Gastroenterology

## 2019-05-29 ENCOUNTER — Encounter: Payer: Self-pay | Admitting: Family Medicine

## 2019-05-29 NOTE — Assessment & Plan Note (Signed)
See notes on follow-up labs.  Continue current medications for now.  See above.  She still on PPI.  She is now passing any more blood.  Okay for outpatient follow-up.  Discussed with patient about GI follow-up in the future.  As a separate issue she is going to monitor the upper back pain that she had been having episodically.  No emergent symptoms. She still on her baseline cholesterol and thyroid medication.

## 2019-05-30 ENCOUNTER — Other Ambulatory Visit: Payer: Self-pay

## 2019-05-30 ENCOUNTER — Telehealth: Payer: Self-pay | Admitting: Gastroenterology

## 2019-05-30 DIAGNOSIS — K317 Polyp of stomach and duodenum: Secondary | ICD-10-CM

## 2019-05-30 DIAGNOSIS — D122 Benign neoplasm of ascending colon: Secondary | ICD-10-CM

## 2019-05-30 NOTE — Telephone Encounter (Signed)
Patient clled & l/m on v/m stating she saw Dr T last week & scheduled a colonoscopy & wanted to confirm it's on 08-08-19. Please call.

## 2019-05-30 NOTE — Telephone Encounter (Signed)
Spoke with pt and confirmed that her procedure is scheduled for 08-08-19. Pt requests that we send her instructions via MyChart and pt plans to call our office for the Suprep prescription closer to her procedure date.

## 2019-06-03 ENCOUNTER — Telehealth: Payer: Self-pay | Admitting: *Deleted

## 2019-06-03 NOTE — Telephone Encounter (Signed)
Patient left a voicemail stating that she had lab work done last Monday and has not gotten the results yet. Patient is requesting a call back with her results.

## 2019-06-03 NOTE — Telephone Encounter (Signed)
Patient advised of her lab results. Also advised patient lab results were mailed to her on 05/30/2019

## 2019-06-11 ENCOUNTER — Other Ambulatory Visit: Payer: Self-pay | Admitting: Family Medicine

## 2019-06-11 DIAGNOSIS — Z1231 Encounter for screening mammogram for malignant neoplasm of breast: Secondary | ICD-10-CM

## 2019-06-17 ENCOUNTER — Telehealth: Payer: Self-pay | Admitting: Gastroenterology

## 2019-06-17 NOTE — Telephone Encounter (Signed)
Pt left vm she states she had an endoscopy and colonoscopy  She has had feelings in  Her mdisection that feels like a contration she states it does not hurt but it is annoying she would like to see what is causing it please advice

## 2019-06-18 NOTE — Telephone Encounter (Signed)
Dr. Fontaine No, I contacted her regarding her symptoms and she stated exactly the message below. No rectal bleeding, fever, chills. She said if feels like something is moving around. It goes from one area to another. No pain.

## 2019-06-18 NOTE — Telephone Encounter (Signed)
Pt is calling she has not heard from anyone regarding the message she left yesterday.

## 2019-06-19 NOTE — Telephone Encounter (Signed)
Spoke with pt and she stated the discomfort wasn't too bad today. She stated she would like to watch it and if it continues through the weekend, she will contact us on Monday to schedule a follow up to discuss.

## 2019-06-20 ENCOUNTER — Telehealth: Payer: Self-pay

## 2019-06-20 NOTE — Telephone Encounter (Signed)
Left detailed VM w COVID screen and back door lab info   

## 2019-06-24 ENCOUNTER — Other Ambulatory Visit: Payer: Self-pay | Admitting: Family Medicine

## 2019-06-24 ENCOUNTER — Other Ambulatory Visit: Payer: Self-pay

## 2019-06-24 ENCOUNTER — Other Ambulatory Visit (INDEPENDENT_AMBULATORY_CARE_PROVIDER_SITE_OTHER): Payer: PPO

## 2019-06-24 DIAGNOSIS — E785 Hyperlipidemia, unspecified: Secondary | ICD-10-CM

## 2019-06-24 DIAGNOSIS — M8580 Other specified disorders of bone density and structure, unspecified site: Secondary | ICD-10-CM | POA: Diagnosis not present

## 2019-06-24 LAB — COMPREHENSIVE METABOLIC PANEL
ALT: 14 U/L (ref 0–35)
AST: 18 U/L (ref 0–37)
Albumin: 4.6 g/dL (ref 3.5–5.2)
Alkaline Phosphatase: 51 U/L (ref 39–117)
BUN: 16 mg/dL (ref 6–23)
CO2: 28 mEq/L (ref 19–32)
Calcium: 9.2 mg/dL (ref 8.4–10.5)
Chloride: 105 mEq/L (ref 96–112)
Creatinine, Ser: 0.72 mg/dL (ref 0.40–1.20)
GFR: 78.94 mL/min (ref >60.00)
Glucose, Bld: 97 mg/dL (ref 70–99)
Potassium: 4.7 mEq/L (ref 3.5–5.1)
Sodium: 140 mEq/L (ref 135–145)
Total Bilirubin: 0.6 mg/dL (ref 0.2–1.2)
Total Protein: 6.7 g/dL (ref 6.0–8.3)

## 2019-06-24 LAB — CBC WITH DIFFERENTIAL/PLATELET
Basophils Absolute: 0.1 10*3/uL (ref 0.0–0.1)
Basophils Relative: 1 % (ref 0.0–3.0)
Eosinophils Absolute: 0.1 10*3/uL (ref 0.0–0.7)
Eosinophils Relative: 1.3 % (ref 0.0–5.0)
HCT: 42.2 % (ref 36.0–46.0)
Hemoglobin: 13.9 g/dL (ref 12.0–15.0)
Lymphocytes Relative: 27 % (ref 12.0–46.0)
Lymphs Abs: 1.7 10*3/uL (ref 0.7–4.0)
MCHC: 32.9 g/dL (ref 30.0–36.0)
MCV: 91.6 fl (ref 78.0–100.0)
Monocytes Absolute: 0.4 10*3/uL (ref 0.1–1.0)
Monocytes Relative: 6.5 % (ref 3.0–12.0)
Neutro Abs: 4.1 10*3/uL (ref 1.4–7.7)
Neutrophils Relative %: 64.2 % (ref 43.0–77.0)
Platelets: 122 10*3/uL — ABNORMAL LOW (ref 150.0–400.0)
RBC: 4.6 Mil/uL (ref 3.87–5.11)
RDW: 15 % (ref 11.5–15.5)
WBC: 6.3 10*3/uL (ref 4.0–10.5)

## 2019-06-24 LAB — LIPID PANEL
Cholesterol: 171 mg/dL (ref 0–200)
HDL: 47.3 mg/dL (ref >39.00)
LDL Cholesterol: 98 mg/dL (ref 0–99)
NonHDL: 123.2
Total CHOL/HDL Ratio: 4
Triglycerides: 128 mg/dL (ref 0.0–149.0)
VLDL: 25.6 mg/dL (ref 0.0–40.0)

## 2019-06-25 LAB — VITAMIN D 25 HYDROXY (VIT D DEFICIENCY, FRACTURES): VITD: 41.96 ng/mL (ref 30.00–100.00)

## 2019-06-27 ENCOUNTER — Encounter: Payer: Self-pay | Admitting: Family Medicine

## 2019-06-27 ENCOUNTER — Other Ambulatory Visit: Payer: Self-pay

## 2019-06-27 ENCOUNTER — Ambulatory Visit (INDEPENDENT_AMBULATORY_CARE_PROVIDER_SITE_OTHER): Payer: PPO | Admitting: Family Medicine

## 2019-06-27 VITALS — BP 122/74 | HR 87 | Temp 97.5°F | Ht 64.0 in | Wt 121.5 lb

## 2019-06-27 DIAGNOSIS — C3431 Malignant neoplasm of lower lobe, right bronchus or lung: Secondary | ICD-10-CM

## 2019-06-27 DIAGNOSIS — K922 Gastrointestinal hemorrhage, unspecified: Secondary | ICD-10-CM | POA: Diagnosis not present

## 2019-06-27 DIAGNOSIS — Z23 Encounter for immunization: Secondary | ICD-10-CM | POA: Diagnosis not present

## 2019-06-27 DIAGNOSIS — Z7189 Other specified counseling: Secondary | ICD-10-CM

## 2019-06-27 DIAGNOSIS — E785 Hyperlipidemia, unspecified: Secondary | ICD-10-CM | POA: Diagnosis not present

## 2019-06-27 DIAGNOSIS — D696 Thrombocytopenia, unspecified: Secondary | ICD-10-CM | POA: Diagnosis not present

## 2019-06-27 DIAGNOSIS — C73 Malignant neoplasm of thyroid gland: Secondary | ICD-10-CM

## 2019-06-27 DIAGNOSIS — Z0000 Encounter for general adult medical examination without abnormal findings: Secondary | ICD-10-CM

## 2019-06-27 MED ORDER — PRAVASTATIN SODIUM 40 MG PO TABS: 40.0000 mg | ORAL_TABLET | Freq: Every day | ORAL | 3 refills | Status: DC

## 2019-06-27 NOTE — Progress Notes (Signed)
Her son has encephalopathy due to liver disease/history of alcohol use and is in treatment.  "He's getting help."  He is currently living at an inpatient treatment facility.  She is looking after her husband at baseline.   Thrombocytopenia.  No bleeding.  PLT level stable.  D/w pt.   H/o thyroid cancer.  On replacement.  Recent TSH 1.2.  No neck mass. No dysphagia.    H/o lung cancer.  F/u with onc pending.  Last CT 2019. No cough, no sputum, no hemoptysis.  She still has some stinging/itching at the surgery site on her R chest wall.    H/o GIB.  No blood in stool.  No vomiting, no diarrhea.  She will occ have the feeling of stomach contracting, as in a feeling of peristalsis across the upper abdomen without pain.  She is going to f/u with GI if it continues.    Elevated Cholesterol: Using medications without problems: yes Muscle aches: no Diet compliance: yes Exercise: yes Labs d/w pt.    Mammogram pending 2020.  DXA 2017, reasonable to defer 2020.   Advance directive- d/w pt. Has a living will. Husband, then patient's sister Vicente Males, then her son designated in that order should patient be incapacitated.  Colonoscopy 2020.    PMH and SH reviewed  Meds, vitals, and allergies reviewed.   ROS: Per HPI unless specifically indicated in ROS section   GEN: nad, alert and oriented HEENT: ncat NECK: supple w/o LA, no tmg CV: rrr. PULM: ctab, no inc wob ABD: soft, +bs EXT: no edema SKIN: no acute rash She has chronic R upper eyelid droop.  This is not an acute finding.  See after visit summary.

## 2019-06-27 NOTE — Patient Instructions (Addendum)
Check with your insurance to see if they will cover the shingrix and tetanus shots.  Flu shot today.  I'll await the notes from Dr. Jacinto Reap.  Take care.  Glad to see you.

## 2019-06-30 DIAGNOSIS — Z0000 Encounter for general adult medical examination without abnormal findings: Secondary | ICD-10-CM | POA: Insufficient documentation

## 2019-06-30 NOTE — Assessment & Plan Note (Signed)
Stable no bleeding.  No emergent symptoms.  We can recheck periodically.

## 2019-06-30 NOTE — Assessment & Plan Note (Signed)
Mammogram pending 2020.  DXA 2017, reasonable to defer 2020.   Advance directive- d/w pt. Has a living will. Husband, then patient's sister Vicente Males, then her son designated in that order should patient be incapacitated.  Colonoscopy 2020.

## 2019-06-30 NOTE — Assessment & Plan Note (Signed)
No blood in stool.  No vomiting, no diarrhea.  She will occ have the feeling of stomach contracting, as in a feeling of peristalsis across the upper abdomen without pain.  She is going to f/u with GI if it continues.   Continue PPI in the meantime.

## 2019-06-30 NOTE — Assessment & Plan Note (Signed)
F/u with onc pending.  Last CT 2019. No cough, no sputum, no hemoptysis.  She still has some stinging/itching at the surgery site on her R chest wall.   I will await oncology notes.

## 2019-06-30 NOTE — Assessment & Plan Note (Signed)
Advance directive- d/w pt. Has a living will. Husband, then patient's sister Vicente Males, then her son designated in that order should patient be incapacitated.

## 2019-06-30 NOTE — Assessment & Plan Note (Signed)
Continue work on diet and exercise.  Labs discussed with patient.  Continue statin.  She agrees.

## 2019-06-30 NOTE — Assessment & Plan Note (Signed)
History of. On replacement.  Recent TSH 1.2.  No neck mass. No dysphagia.  Continue as is.  She has oncology follow-up pending.

## 2019-07-10 ENCOUNTER — Telehealth: Payer: Self-pay | Admitting: *Deleted

## 2019-07-10 ENCOUNTER — Encounter: Payer: Self-pay | Admitting: *Deleted

## 2019-07-10 NOTE — Telephone Encounter (Signed)
Patient will need a cbc, thyroid panel with tsh, thyroglobulin RIA.  [facility] orders signed.

## 2019-07-10 NOTE — Telephone Encounter (Signed)
Orders signed. Josh, NP will place downstairs for patient to pick up. He will inform patient- forms signed.

## 2019-07-10 NOTE — Telephone Encounter (Signed)
Lab requisition placed in an envelope and given to front lobby staff.  I called patient and notified her that it is available for pickup.

## 2019-07-10 NOTE — Telephone Encounter (Signed)
Patient has an appointment with Dr B 9/22, she is asking if she needs to pick up a [facility] order form fr thyroid labs to be done prior to her appointment as she usually does. Please advise

## 2019-07-15 DIAGNOSIS — C73 Malignant neoplasm of thyroid gland: Secondary | ICD-10-CM | POA: Diagnosis not present

## 2019-07-16 ENCOUNTER — Other Ambulatory Visit: Payer: Self-pay

## 2019-07-16 ENCOUNTER — Encounter: Payer: Self-pay | Admitting: Internal Medicine

## 2019-07-16 ENCOUNTER — Telehealth: Payer: Self-pay

## 2019-07-16 ENCOUNTER — Other Ambulatory Visit: Payer: Self-pay | Admitting: Gastroenterology

## 2019-07-16 MED ORDER — ONDANSETRON HCL 4 MG PO TABS: 4.0000 mg | ORAL_TABLET | Freq: Three times a day (TID) | ORAL | 0 refills | Status: DC | PRN

## 2019-07-16 MED ORDER — NA SULFATE-K SULFATE-MG SULF 17.5-3.13-1.6 GM/177ML PO SOLN: 354.0000 mL | Freq: Once | ORAL | 0 refills | Status: AC

## 2019-07-16 MED ORDER — BISACODYL EC 5 MG PO TBEC: DELAYED_RELEASE_TABLET | ORAL | 0 refills | Status: DC

## 2019-07-16 NOTE — Telephone Encounter (Signed)
Called patient and left a detail message that this was sent to the pharmacy

## 2019-07-16 NOTE — Telephone Encounter (Signed)
Patient has a colonoscopy scheduled for 08/14/2019 with you. Patient states that the two times she has had a colonoscopy she has got really nausea and the last time she was in the hospital and they had to give her something for nausea because she is vomiting. Patient wants to know if something can be called in for nausea during the colonoscopy prep.

## 2019-07-22 ENCOUNTER — Encounter: Payer: Self-pay | Admitting: Internal Medicine

## 2019-07-23 ENCOUNTER — Other Ambulatory Visit: Payer: Self-pay

## 2019-07-23 ENCOUNTER — Inpatient Hospital Stay: Payer: PPO | Attending: Internal Medicine | Admitting: Internal Medicine

## 2019-07-23 DIAGNOSIS — Z90710 Acquired absence of both cervix and uterus: Secondary | ICD-10-CM | POA: Diagnosis not present

## 2019-07-23 DIAGNOSIS — R140 Abdominal distension (gaseous): Secondary | ICD-10-CM | POA: Insufficient documentation

## 2019-07-23 DIAGNOSIS — Z9049 Acquired absence of other specified parts of digestive tract: Secondary | ICD-10-CM | POA: Diagnosis not present

## 2019-07-23 DIAGNOSIS — Z79899 Other long term (current) drug therapy: Secondary | ICD-10-CM | POA: Diagnosis not present

## 2019-07-23 DIAGNOSIS — Z85850 Personal history of malignant neoplasm of thyroid: Secondary | ICD-10-CM | POA: Insufficient documentation

## 2019-07-23 DIAGNOSIS — C3431 Malignant neoplasm of lower lobe, right bronchus or lung: Secondary | ICD-10-CM | POA: Insufficient documentation

## 2019-07-23 DIAGNOSIS — Z8041 Family history of malignant neoplasm of ovary: Secondary | ICD-10-CM | POA: Diagnosis not present

## 2019-07-23 DIAGNOSIS — M8580 Other specified disorders of bone density and structure, unspecified site: Secondary | ICD-10-CM | POA: Diagnosis not present

## 2019-07-23 NOTE — Assessment & Plan Note (Addendum)
#   RLL Lung ca; Stage I;  no adjuvant therapy. DEC 19th CT-subcentimeter lung nodule/liver -hemangioma stable; thoracic spine hemangioma -stable..   # Continue surveillance; plan imaging again in 6 months. CT scan prior to next visit.   # Thyroid cancer status post thyroidectomy low risk stage I; on Synthroid 88 mcg. TSH- 0.5; continue current dose;   Negative thyroglobulin; positive antibody.Reviewed the labs  # Peripheral neuropathy- ? Etiology. Defer to PCP.   # abdominal bloating status post gallbladder surgery-chronic stable.  Colestipol.  # BMD June 2019- T score= 2.2; Osteopenia/ hypocalcemia- vit D+ ca BID; exercise.  Discussed the potential risk factors; and treatment options.  Awaiting BMD in 2020 with  PCP.  #  DISPOSITION:  # follow up in 4 months-MD [[facility]/cmp/ thyroid profile/ Thyroiglobulin/ antibodies- 1 week prior]; CT chest prior- Dr.B  Cc; Dr.Duncan.

## 2019-07-23 NOTE — Progress Notes (Signed)
[facility] OFFICE PROGRESS NOTE  Patient Care Team: Tonia Ghent, MD as PCP - General (Family Medicine) Beverly Gust, MD as Consulting Physician (Otolaryngology) Nestor Lewandowsky, MD as Consulting Physician (Cardiothoracic Surgery) Cammie Sickle, MD as Consulting Physician (Internal Medicine) Birder Robson, MD as Consulting Physician (Ophthalmology) Clayburn Pert, MD as Consulting Physician (General Surgery)  Cancer Staging Thyroid cancer [facility]) Staging form: Thyroid - Papillary or Follicular (Under 45 years), AJCC 7th Edition - Clinical: Stage I (T1, N0, M0) - Signed by Forest Gleason, MD on 09/11/2015    Oncology History Overview Note  # NOV 2016- RLL [place] ca T1N0 [incidental; Dr.Simonds; [place]- s/p RLlobectomy; Dr.Oaks;Dec 2016]  # FEB 2017- 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 STAGE I; s/p Total Thyroidetcomy [Dr.Chapman];NO RAIU.  LOW RISK; but detectable thyroglobulin- on Synthroid  DIAGNOSIS: [ ]   STAGE:         ;GOALS:  CURRENT/MOST RECENT THERAPY [ ]     Thyroid cancer ([place])  Cancer of lower lobe of right lung ([place])  05/06/2016 Initial Diagnosis   Malignant neoplasm of lower lobe, right bronchus or lung ([place])       INTERVAL HISTORY:  [name] 73 y.o.  female pleasant patient above history of stage I lung cancer and also stage I thyroid cancer currently on surveillance is here for follow-up.  Patient denies any unusual shortness of breath or cough.  Mild fatigue.  Denies any palpitations.  Denies any weight loss.  Denies any lumps or bumps in the neck.  No chest pain or shortness with cough.  Review of Systems  Constitutional: Positive for malaise/fatigue. Negative for chills, diaphoresis, fever and weight loss.  HENT: Negative for nosebleeds and sore throat.   Eyes: Negative for double vision.  Respiratory: Negative for cough, hemoptysis, sputum  production, shortness of breath and wheezing.   Cardiovascular: Negative for chest pain, palpitations, orthopnea and leg swelling.  Gastrointestinal: Negative for abdominal pain, blood in stool, constipation, diarrhea, heartburn, melena and vomiting.  Genitourinary: Negative for dysuria, frequency and urgency.  Musculoskeletal: Positive for back pain. Negative for joint pain.  Skin: Negative.  Negative for itching and rash.  Neurological: Positive for tingling. Negative for dizziness, focal weakness, weakness and headaches.  Endo/Heme/Allergies: Does not bruise/bleed easily.  Psychiatric/Behavioral: Negative for depression. The patient is not nervous/anxious and does not have insomnia.       PAST MEDICAL HISTORY :  Past Medical History:  Diagnosis Date  . 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 GERD (gastroesophageal reflux disease)   . GIB (gastrointestinal bleeding)   . Groin pain    Along superior/laterl R inguinal canal.  Could be due to hernia or old scar tissue.  prev with CT done w/o alarming findings.  Will treat episodically.  Use tylenol #3 prn.    . Hyperlipidemia   . Lipoma of thigh    Left  . Normal cardiac stress test 2014  . PONV (postoperative nausea and vomiting)    nausea after hysterectomy, and 2 days after lung surgery 10/2015  . Thyroid cancer ([place]) 12/2015  . Thyroid nodule     PAST SURGICAL HISTORY :   Past Surgical History:  Procedure Laterality Date  . BREAST EXCISIONAL BIOPSY Right 1977  . BREAST MASS EXCISION  1977   benign  . CARPAL TUNNEL RELEASE Right 1978  . CATARACT EXTRACTION, BILATERAL Bilateral   . CHOLECYSTECTOMY  11/23/2016   Procedure: LAPAROSCOPIC CHOLECYSTECTOMY;  Surgeon: Clayburn Pert, MD;  Location: [facility] ORS;  Service: General;;  . COLONOSCOPY  2009  . COLONOSCOPY WITH PROPOFOL N/A 05/18/2019   Procedure: COLONOSCOPY WITH PROPOFOL;  Surgeon: Virgel Manifold, MD;  Location: [facility] ENDOSCOPY;  Service: Endoscopy;   Laterality: N/A;  . DILATION AND CURETTAGE OF UTERUS  2001  . ELECTROMAGNETIC NAVIGATION BROCHOSCOPY N/A 08/25/2015   Procedure: ELECTROMAGNETIC NAVIGATION BRONCHOSCOPY;  Surgeon: Flora Lipps, MD;  Location: [facility] ORS;  Service: Cardiopulmonary;  Laterality: N/A;  . ENDOBRONCHIAL ULTRASOUND N/A 08/25/2015   Procedure: ENDOBRONCHIAL ULTRASOUND;  Surgeon: Flora Lipps, MD;  Location: [facility] ORS;  Service: Cardiopulmonary;  Laterality: N/A;  . ESOPHAGOGASTRODUODENOSCOPY  2002, 11/15/07   Normal (Dr. Allyn Kenner)  . ESOPHAGOGASTRODUODENOSCOPY (EGD) WITH PROPOFOL N/A 05/18/2019   Procedure: ESOPHAGOGASTRODUODENOSCOPY (EGD) WITH PROPOFOL;  Surgeon: Virgel Manifold, MD;  Location: [facility] ENDOSCOPY;  Service: Endoscopy;  Laterality: N/A;  . FACIAL COSMETIC SURGERY  01/13/10   mini facelift  . HERNIA REPAIR  1976  . THORACOTOMY/LOBECTOMY Right 10/27/2015   Procedure: THORACOTOMY/LOBECTOMY;  Surgeon: Nestor Lewandowsky, MD;  Location: [facility] ORS;  Service: Thoracic;  Laterality: Right;  . THYROIDECTOMY N/A 12/29/2015   Procedure: THYROIDECTOMY;  Surgeon: Beverly Gust, MD;  Location: [facility] ORS;  Service: ENT;  Laterality: N/A;  . TOTAL VAGINAL HYSTERECTOMY  12/30/08   for fibroid pain (Dr. Gertie Fey)    FAMILY HISTORY :   Family History  Problem Relation Age of Onset  . Transient ischemic attack Mother   . Hypertension Mother   . Stroke Mother        mini strokes  . Hypertension Sister   . Cancer Sister        ovarian  . Hypertension Sister   . Hypertension Sister   . Hypertension Sister   . Breast cancer Neg Hx   . Colon cancer Neg Hx     SOCIAL HISTORY:   Social History   Tobacco Use  . Smoking status: Never Smoker  . Smokeless tobacco: Never Used  Substance Use Topics  . Alcohol use: No    Alcohol/week: 0.0 standard drinks  . Drug use: No    ALLERGIES:  is allergic to atorvastatin; ciprofloxacin; epinephrine; metoprolol succinate; simvastatin; sulfa antibiotics; and sulfonamide  derivatives.  MEDICATIONS:  Current Outpatient Medications  Medication Sig Dispense Refill  . Ascorbic Acid (VITAMIN C) 500 MG tablet Take 2,000 mg by mouth daily. Reported on 02/04/2016    . Biotin (PA BIOTIN) 1000 MCG tablet Take 1,000 mcg by mouth daily. Reported on 02/04/2016    . CALCIUM-MAG-VIT C-VIT D PO Take by mouth daily.    . Cholecalciferol (VITAMIN D-3) 1000 units CAPS Take 1 capsule by mouth daily with supper.     . Coenzyme Q10 200 MG capsule Take 200 mg by mouth daily.    . Folic Acid-Vit N8-GNF A21 (FA-VITAMIN B-6-VITAMIN B-12 PO) Take 1 tablet by mouth daily after breakfast. Reported on 02/04/2016    . levothyroxine (SYNTHROID, LEVOTHROID) 88 MCG tablet Take 1 tablet (88 mcg total) by mouth daily before breakfast. 90 tablet 3  . Lutein 20 MG CAPS Take 20 mg by mouth daily.    . Omega-3 Fatty Acids (SUPER OMEGA 3 EPA/DHA) 1000 MG CAPS Take 1,000 mg by mouth daily with supper.    Marland Kitchen omeprazole (PRILOSEC) 40 MG capsule TAKE 1 CAPSULE BY MOUTH EVERY DAY 90 capsule 3  . Potassium Gluconate 595 MG CAPS Take 595 mg by mouth daily.     . pravastatin (PRAVACHOL) 40 MG tablet  Take 1 tablet (40 mg total) by mouth daily. 90 tablet 3  . bisacodyl (BISACODYL) 5 MG EC tablet Take 2 tablets (10mg ) by mouth the day before your procedure between 1pm-3pm. (Patient not taking: Reported on 07/23/2019) 2 tablet 0  . ondansetron (ZOFRAN) 4 MG tablet Take 1 tablet (4 mg total) by mouth every 8 (eight) hours as needed for up to 3 doses for nausea or vomiting. (Patient not taking: Reported on 07/23/2019) 3 tablet 0   No current facility-administered medications for this visit.     PHYSICAL EXAMINATION: ECOG PERFORMANCE STATUS: 1 - Symptomatic but completely ambulatory  BP 131/79   Pulse 85   Temp (!) 95.2 F (35.1 C)   Resp 18   Wt 122 lb (55.3 kg)   BMI 20.94 kg/m   Filed Weights   07/23/19 1406  Weight: 122 lb (55.3 kg)    Physical Exam  Constitutional: She is oriented to person, place, and  time and well-developed, well-nourished, and in no distress.  HENT:  Head: Normocephalic and atraumatic.  Mouth/Throat: Oropharynx is clear and moist. No oropharyngeal exudate.  Eyes: Pupils are equal, round, and reactive to light.  Neck: Normal range of motion. Neck supple.  Cardiovascular: Normal rate and regular rhythm.  Pulmonary/Chest: No respiratory distress. She has no wheezes.  Abdominal: Soft. Bowel sounds are normal. She exhibits no distension and no mass. There is no abdominal tenderness. There is no rebound and no guarding.  Musculoskeletal: Normal range of motion.        General: No tenderness or edema.  Neurological: She is alert and oriented to person, place, and time.  Skin: Skin is warm.  Psychiatric: Affect normal.      LABORATORY DATA:  I have reviewed the data as listed    Component Value Date/Time   NA 140 06/24/2019 0744   NA 141 07/01/2013 0103   K 4.7 06/24/2019 0744   K 3.8 07/01/2013 0103   CL 105 06/24/2019 0744   CL 110 (H) 07/01/2013 0103   CO2 28 06/24/2019 0744   CO2 27 07/01/2013 0103   GLUCOSE 97 06/24/2019 0744   GLUCOSE 88 07/01/2013 0103   BUN 16 06/24/2019 0744   BUN 11 07/01/2013 0103   CREATININE 0.72 06/24/2019 0744   CREATININE 0.68 07/01/2013 0103   CALCIUM 9.2 06/24/2019 0744   CALCIUM 8.2 (L) 07/01/2013 0103   PROT 6.7 06/24/2019 0744   PROT 5.5 (L) 07/01/2013 0103   ALBUMIN 4.6 06/24/2019 0744   ALBUMIN 3.0 (L) 07/01/2013 0103   AST 18 06/24/2019 0744   AST 19 07/01/2013 0103   ALT 14 06/24/2019 0744   ALT 24 07/01/2013 0103   ALKPHOS 51 06/24/2019 0744   ALKPHOS 55 07/01/2013 0103   BILITOT 0.6 06/24/2019 0744   BILITOT 0.7 07/01/2013 0103   GFRNONAA >60 05/19/2019 0117   GFRNONAA >60 07/01/2013 0103   GFRAA >60 05/19/2019 0117   GFRAA >60 07/01/2013 0103    No results found for: SPEP, UPEP  Lab Results  Component Value Date   WBC 6.3 06/24/2019   NEUTROABS 4.1 06/24/2019   HGB 13.9 06/24/2019   HCT 42.2  06/24/2019   MCV 91.6 06/24/2019   PLT 122.0 Repeated and verified X2. (L) 06/24/2019      Chemistry      Component Value Date/Time   NA 140 06/24/2019 0744   NA 141 07/01/2013 0103   K 4.7 06/24/2019 0744   K 3.8 07/01/2013 0103   CL 105  06/24/2019 0744   CL 110 (H) 07/01/2013 0103   CO2 28 06/24/2019 0744   CO2 27 07/01/2013 0103   BUN 16 06/24/2019 0744   BUN 11 07/01/2013 0103   CREATININE 0.72 06/24/2019 0744   CREATININE 0.68 07/01/2013 0103      Component Value Date/Time   CALCIUM 9.2 06/24/2019 0744   CALCIUM 8.2 (L) 07/01/2013 0103   ALKPHOS 51 06/24/2019 0744   ALKPHOS 55 07/01/2013 0103   AST 18 06/24/2019 0744   AST 19 07/01/2013 0103   ALT 14 06/24/2019 0744   ALT 24 07/01/2013 0103   BILITOT 0.6 06/24/2019 0744   BILITOT 0.7 07/01/2013 0103       RADIOGRAPHIC STUDIES: I have personally reviewed the radiological images as listed and agreed with the findings in the report. No results found.   ASSESSMENT & PLAN:  Cancer of lower lobe of right lung ([place]) # RLL Lung ca; Stage I;  no adjuvant therapy. DEC 19th CT-subcentimeter lung nodule/liver -hemangioma stable; thoracic spine hemangioma -stable..   # Continue surveillance; plan imaging again in 6 months. CT scan prior to next visit.   # Thyroid cancer status post thyroidectomy low risk stage I; on Synthroid 88 mcg. TSH- 0.5; continue current dose;   Negative thyroglobulin; positive antibody.Reviewed the labs  # Peripheral neuropathy- ? Etiology. Defer to PCP.   # abdominal bloating status post gallbladder surgery-chronic stable.  Colestipol.  # BMD June 2019- T score= 2.2; Osteopenia/ hypocalcemia- vit D+ ca BID; exercise.  Discussed the potential risk factors; and treatment options.  Awaiting BMD in 2020 with  PCP.  #  DISPOSITION:  # follow up in 4 months-MD [[facility]/ thyroid profile/ Thyroiglobulin/ antibodies- 1 week prior]; CT chest prior- Dr.B  Cc; Dr.Duncan.       Orders Placed  This Encounter  Procedures  . CT CHEST WO CONTRAST    Standing Status:   Future    Standing Expiration Date:   07/22/2020    Order Specific Question:   Preferred imaging location?    Answer:   [facility]    Order Specific Question:   Radiology Contrast Protocol - do NOT remove file path    Answer:   [email or web address]    Order Specific Question:   ** REASON FOR EXAM (FREE TEXT)    Answer:   lung cancer   All questions were answered. The patient knows to call the clinic with any problems, questions or concerns.      Cammie Sickle, MD 08/05/2019 7:25 AM

## 2019-07-24 ENCOUNTER — Ambulatory Visit
Admission: RE | Admit: 2019-07-24 | Discharge: 2019-07-24 | Disposition: A | Discharge status: Home / Self Care | Payer: PPO | Source: Ambulatory Visit | Attending: Family Medicine | Admitting: Family Medicine

## 2019-07-24 DIAGNOSIS — Z1231 Encounter for screening mammogram for malignant neoplasm of breast: Secondary | ICD-10-CM

## 2019-07-25 ENCOUNTER — Telehealth: Payer: Self-pay | Admitting: Family Medicine

## 2019-07-25 DIAGNOSIS — N644 Mastodynia: Secondary | ICD-10-CM

## 2019-07-25 NOTE — Telephone Encounter (Signed)
Patient went yesterday to [facility] in [place] for a screening mammogram.  They asked her if she was having any pain and she told them that sometimes she has pain in the left breast.  They told her they would contact PCP to get a diagnostic order.  Patient said she would rather go to [facility] for her Mammogram and wanted to know if her appointment could be switched to [place] due to the traffic.

## 2019-07-26 NOTE — Telephone Encounter (Signed)
Spoke with patient, , please put order in Epic for [id number], Diag Bil MMG Tomo, choose [facility] as the location [id number] - comment Left Breast pain at 5-6 oclock position, [id number] - just because they want you to add this because they dont want to add this order for you. Patient will call and will have her films sent to [facility] from the [facility] [place].

## 2019-07-26 NOTE — Telephone Encounter (Signed)
The order is incorrect. It always needs to be a TOMO when ordering a Diagnostic Mammogram and you will also need to say clock location of the pain in left breast and you will also need to order Uni left and right [place] to go along with the Diagnostic MMG.

## 2019-07-26 NOTE — Telephone Encounter (Signed)
I change the order.  Rosaria Ferries please let me know if anything extra needs to be added to the order.  Thanks.

## 2019-07-26 NOTE — Telephone Encounter (Signed)
Please let me know if staff needs to call pt about clock position, etc.  I don't have that information.

## 2019-07-28 NOTE — Telephone Encounter (Signed)
Ordered diagnostic mammogram and both of the ultrasounds.  Thanks.

## 2019-07-29 ENCOUNTER — Other Ambulatory Visit: Payer: Self-pay | Admitting: Family Medicine

## 2019-07-29 DIAGNOSIS — N644 Mastodynia: Secondary | ICD-10-CM

## 2019-07-29 NOTE — Telephone Encounter (Signed)
Scheduled for 08/05/19 at  9:25AM. Patient aware

## 2019-08-05 ENCOUNTER — Ambulatory Visit
Admission: RE | Admit: 2019-08-05 | Discharge: 2019-08-05 | Disposition: A | Discharge status: Home / Self Care | Payer: PPO | Source: Ambulatory Visit | Attending: Family Medicine | Admitting: Family Medicine

## 2019-08-05 DIAGNOSIS — N644 Mastodynia: Secondary | ICD-10-CM

## 2019-08-05 DIAGNOSIS — R928 Other abnormal and inconclusive findings on diagnostic imaging of breast: Secondary | ICD-10-CM | POA: Diagnosis not present

## 2019-08-12 ENCOUNTER — Other Ambulatory Visit
Admission: RE | Admit: 2019-08-12 | Discharge: 2019-08-12 | Disposition: A | Discharge status: Home / Self Care | Payer: PPO | Source: Ambulatory Visit | Attending: Gastroenterology | Admitting: Gastroenterology

## 2019-08-12 ENCOUNTER — Other Ambulatory Visit: Payer: Self-pay

## 2019-08-12 DIAGNOSIS — Z20828 Contact with and (suspected) exposure to other viral communicable diseases: Secondary | ICD-10-CM | POA: Insufficient documentation

## 2019-08-12 DIAGNOSIS — Z01812 Encounter for preprocedural laboratory examination: Secondary | ICD-10-CM | POA: Diagnosis not present

## 2019-08-13 LAB — SARS CORONAVIRUS 2 (TAT 6-24 HRS): SARS Coronavirus 2: NEGATIVE

## 2019-08-15 ENCOUNTER — Ambulatory Visit: Payer: PPO | Admitting: Anesthesiology

## 2019-08-15 ENCOUNTER — Encounter: Payer: Self-pay | Admitting: *Deleted

## 2019-08-15 ENCOUNTER — Encounter
Admission: RE | Disposition: A | Discharge status: Home / Self Care | Payer: Self-pay | Source: Home / Self Care | Attending: Gastroenterology

## 2019-08-15 ENCOUNTER — Ambulatory Visit
Admission: RE | Admit: 2019-08-15 | Discharge: 2019-08-15 | Disposition: A | Discharge status: Home / Self Care | Payer: PPO | Attending: Gastroenterology | Admitting: Gastroenterology

## 2019-08-15 ENCOUNTER — Other Ambulatory Visit: Payer: Self-pay

## 2019-08-15 DIAGNOSIS — Z79890 Hormone replacement therapy (postmenopausal): Secondary | ICD-10-CM | POA: Insufficient documentation

## 2019-08-15 DIAGNOSIS — D696 Thrombocytopenia, unspecified: Secondary | ICD-10-CM | POA: Diagnosis not present

## 2019-08-15 DIAGNOSIS — K219 Gastro-esophageal reflux disease without esophagitis: Secondary | ICD-10-CM | POA: Diagnosis not present

## 2019-08-15 DIAGNOSIS — Z85850 Personal history of malignant neoplasm of thyroid: Secondary | ICD-10-CM | POA: Insufficient documentation

## 2019-08-15 DIAGNOSIS — Z85118 Personal history of other malignant neoplasm of bronchus and lung: Secondary | ICD-10-CM | POA: Diagnosis not present

## 2019-08-15 DIAGNOSIS — E785 Hyperlipidemia, unspecified: Secondary | ICD-10-CM | POA: Diagnosis not present

## 2019-08-15 DIAGNOSIS — D122 Benign neoplasm of ascending colon: Secondary | ICD-10-CM | POA: Diagnosis not present

## 2019-08-15 DIAGNOSIS — I10 Essential (primary) hypertension: Secondary | ICD-10-CM | POA: Diagnosis not present

## 2019-08-15 DIAGNOSIS — K317 Polyp of stomach and duodenum: Secondary | ICD-10-CM

## 2019-08-15 DIAGNOSIS — K3189 Other diseases of stomach and duodenum: Secondary | ICD-10-CM

## 2019-08-15 DIAGNOSIS — D123 Benign neoplasm of transverse colon: Secondary | ICD-10-CM | POA: Diagnosis not present

## 2019-08-15 DIAGNOSIS — Z09 Encounter for follow-up examination after completed treatment for conditions other than malignant neoplasm: Secondary | ICD-10-CM | POA: Diagnosis not present

## 2019-08-15 DIAGNOSIS — K635 Polyp of colon: Secondary | ICD-10-CM

## 2019-08-15 DIAGNOSIS — Z8719 Personal history of other diseases of the digestive system: Secondary | ICD-10-CM | POA: Diagnosis not present

## 2019-08-15 DIAGNOSIS — Z79899 Other long term (current) drug therapy: Secondary | ICD-10-CM | POA: Insufficient documentation

## 2019-08-15 DIAGNOSIS — K2930 Chronic superficial gastritis without bleeding: Secondary | ICD-10-CM | POA: Diagnosis not present

## 2019-08-15 HISTORY — PX: COLONOSCOPY WITH PROPOFOL: SHX5780

## 2019-08-15 HISTORY — PX: ESOPHAGOGASTRODUODENOSCOPY (EGD) WITH PROPOFOL: SHX5813

## 2019-08-15 SURGERY — COLONOSCOPY WITH PROPOFOL
Anesthesia: General

## 2019-08-15 MED ORDER — FENTANYL CITRATE (PF) 100 MCG/2ML IJ SOLN
INTRAMUSCULAR | Status: DC | PRN
  Administered 2019-08-15: 25 ug via INTRAVENOUS

## 2019-08-15 MED ORDER — FENTANYL CITRATE (PF) 100 MCG/2ML IJ SOLN
INTRAMUSCULAR | Status: AC
  Filled 2019-08-15: qty 2

## 2019-08-15 MED ORDER — PROPOFOL 500 MG/50ML IV EMUL
INTRAVENOUS | Status: DC | PRN
  Administered 2019-08-15: 180 ug/kg/min via INTRAVENOUS

## 2019-08-15 MED ORDER — SODIUM CHLORIDE 0.9 % IV SOLN
INTRAVENOUS | Status: DC
  Administered 2019-08-15: 08:00:00 via INTRAVENOUS

## 2019-08-15 MED ORDER — LIDOCAINE HCL (PF) 2 % IJ SOLN
INTRAMUSCULAR | Status: AC
  Filled 2019-08-15: qty 10

## 2019-08-15 MED ORDER — EPHEDRINE SULFATE 50 MG/ML IJ SOLN
INTRAMUSCULAR | Status: DC | PRN
  Administered 2019-08-15: 10 mg via INTRAVENOUS

## 2019-08-15 MED ORDER — PROPOFOL 10 MG/ML IV BOLUS
INTRAVENOUS | Status: DC | PRN
  Administered 2019-08-15: 20 mg via INTRAVENOUS
  Administered 2019-08-15 (×2): 40 mg via INTRAVENOUS

## 2019-08-15 MED ORDER — ONDANSETRON HCL 4 MG/2ML IJ SOLN
INTRAMUSCULAR | Status: DC | PRN
  Administered 2019-08-15: 4 mg via INTRAVENOUS

## 2019-08-15 MED ORDER — PROPOFOL 10 MG/ML IV BOLUS
INTRAVENOUS | Status: AC
  Filled 2019-08-15: qty 20

## 2019-08-15 MED ORDER — LIDOCAINE HCL (CARDIAC) PF 100 MG/5ML IV SOSY
PREFILLED_SYRINGE | INTRAVENOUS | Status: DC | PRN
  Administered 2019-08-15: 80 mg via INTRAVENOUS

## 2019-08-15 NOTE — Anesthesia Preprocedure Evaluation (Signed)
Anesthesia Evaluation  Patient identified by MRN, date of birth, ID band Patient awake    Reviewed: Allergy & Precautions, H&P , NPO status , Patient's Chart, lab work & pertinent test results, reviewed documented beta blocker date and time   History of Anesthesia Complications (+) PONV and history of anesthetic complications  Airway Mallampati: II   Neck ROM: full    Dental  (+) Poor Dentition   Pulmonary neg pulmonary ROS,    Pulmonary exam normal        Cardiovascular Exercise Tolerance: Good hypertension, negative cardio ROS Normal cardiovascular exam Rhythm:regular Rate:Normal     Neuro/Psych negative neurological ROS  negative psych ROS   GI/Hepatic negative GI ROS, Neg liver ROS, GERD  Medicated,  Endo/Other  negative endocrine ROS  Renal/GU negative Renal ROS  negative genitourinary   Musculoskeletal   Abdominal   Peds  Hematology negative hematology ROS (+)   Anesthesia Other Findings Past Medical History: 10/01/2015: Adenocarcinoma of right lung (HCC) No date: GERD (gastroesophageal reflux disease) No date: GIB (gastrointestinal bleeding) No date: Groin pain     Comment:  Along superior/laterl R inguinal canal.  Could be due to              hernia or old scar tissue.  prev with CT done w/o               alarming findings.  Will treat episodically.  Use tylenol              #3 prn.   No date: Hyperlipidemia No date: Lipoma of thigh     Comment:  Left 2014: Normal cardiac stress test No date: PONV (postoperative nausea and vomiting)     Comment:  nausea after hysterectomy, and 2 days after lung surgery              10/2015 12/2015: Thyroid cancer ([place]) No date: Thyroid nodule Past Surgical History: 1977: BREAST EXCISIONAL BIOPSY; Right 1977: BREAST MASS EXCISION     Comment:  benign 1978: CARPAL TUNNEL RELEASE; Right No date: CATARACT EXTRACTION, BILATERAL; Bilateral 11/23/2016:  CHOLECYSTECTOMY     Comment:  Procedure: LAPAROSCOPIC CHOLECYSTECTOMY;  Surgeon:               Clayburn Pert, MD;  Location: [facility] ORS;  Service:               General;; 2009: COLONOSCOPY 05/18/2019: COLONOSCOPY WITH PROPOFOL; N/A     Comment:  Procedure: COLONOSCOPY WITH PROPOFOL;  Surgeon:               Virgel Manifold, MD;  Location: [facility] ENDOSCOPY;                Service: Endoscopy;  Laterality: N/A; 2001: [place] OF UTERUS 08/25/2015: ELECTROMAGNETIC NAVIGATION BROCHOSCOPY; N/A     Comment:  Procedure: ELECTROMAGNETIC NAVIGATION BRONCHOSCOPY;                Surgeon: Flora Lipps, MD;  Location: [facility] ORS;  Service:               Cardiopulmonary;  Laterality: N/A; 08/25/2015: ENDOBRONCHIAL ULTRASOUND; N/A     Comment:  Procedure: ENDOBRONCHIAL ULTRASOUND;  Surgeon: Flora Lipps, MD;  Location: [facility] ORS;  Service: Cardiopulmonary;              Laterality: N/A; 2002, 11/15/07: ESOPHAGOGASTRODUODENOSCOPY     Comment:  Normal (  Dr. Allyn Kenner) 05/18/2019: ESOPHAGOGASTRODUODENOSCOPY (EGD) WITH PROPOFOL; N/A     Comment:  Procedure: ESOPHAGOGASTRODUODENOSCOPY (EGD) WITH               PROPOFOL;  Surgeon: Virgel Manifold, MD;  Location:               [facility] ENDOSCOPY;  Service: Endoscopy;  Laterality: N/A; 01/13/10: FACIAL COSMETIC SURGERY     Comment:  mini facelift 1976: HERNIA REPAIR 10/27/2015: THORACOTOMY/LOBECTOMY; Right     Comment:  Procedure: THORACOTOMY/LOBECTOMY;  Surgeon: Nestor Lewandowsky, MD;  Location: [facility] ORS;  Service: Thoracic;                Laterality: Right; 12/29/2015: THYROIDECTOMY; N/A     Comment:  Procedure: THYROIDECTOMY;  Surgeon: Beverly Gust, MD;              Location: [facility] ORS;  Service: ENT;  Laterality: N/A; 12/30/08: TOTAL VAGINAL HYSTERECTOMY     Comment:  for fibroid pain (Dr. Gertie Fey) BMI    Body Mass Index: 19.97 kg/m     Reproductive/Obstetrics negative OB ROS                              Anesthesia Physical Anesthesia Plan  ASA: III  Anesthesia Plan: General   Post-op Pain Management:    Induction:   PONV Risk Score and Plan:   Airway Management Planned:   Additional Equipment:   Intra-op Plan:   Post-operative Plan:   Informed Consent: I have reviewed the patients History and Physical, chart, labs and discussed the procedure including the risks, benefits and alternatives for the proposed anesthesia with the patient or authorized representative who has indicated his/her understanding and acceptance.     Dental Advisory Given  Plan Discussed with: CRNA  Anesthesia Plan Comments:         Anesthesia Quick Evaluation

## 2019-08-15 NOTE — Transfer of Care (Signed)
Immediate Anesthesia Transfer of Care Note  Patient: Ivette Castronova  Procedure(s) Performed: COLONOSCOPY WITH PROPOFOL (N/A ) ESOPHAGOGASTRODUODENOSCOPY (EGD) WITH PROPOFOL (N/A )  Patient Location: PACU  Anesthesia Type:General  Level of Consciousness: sedated  Airway & Oxygen Therapy: Patient Spontanous Breathing and Patient connected to nasal cannula oxygen  Post-op Assessment: Report given to RN and Post -op Vital signs reviewed and stable  Post vital signs: Reviewed and stable  Last Vitals:  Vitals Value Taken Time  BP 107/66 08/15/19 0910  Temp 36.4 C 08/15/19 0910  Pulse 78 08/15/19 0913  Resp 12 08/15/19 0913  SpO2 100 % 08/15/19 0913  Vitals shown include unvalidated device data.  Last Pain:  Vitals:   08/15/19 0910  TempSrc: Tympanic  PainSc: Asleep         Complications: No apparent anesthesia complications

## 2019-08-15 NOTE — Op Note (Signed)
[facility] Gastroenterology Patient Name: Brookelynn Hamor Procedure Date: 08/15/2019 7:29 AM MRN: [id number] Account #: [id number] Date of Birth: September 07, 1946 Admit Type: Outpatient Age: 73 Room: [facility] ENDO ROOM 1 Gender: Female Note Status: Finalized Procedure:            Colonoscopy Indications:          High risk colon cancer surveillance: Personal history                        of colonic polyps Providers:            Analucia Hush B. Bonna Gains MD, MD Medicines:            Monitored Anesthesia Care Complications:        No immediate complications. Procedure:            Pre-Anesthesia Assessment:                       - ASA Grade Assessment: II - A patient with mild                        systemic disease.                       - Prior to the procedure, a History and Physical was                        performed, and patient medications, allergies and                        sensitivities were reviewed. The patient's tolerance of                        previous anesthesia was reviewed.                       - The risks and benefits of the procedure and the                        sedation options and risks were discussed with the                        patient. All questions were answered and informed                        consent was obtained.                       - Patient identification and proposed procedure were                        verified prior to the procedure by the physician, the                        nurse, the anesthesiologist, the anesthetist and the                        technician. The procedure was verified in the procedure                        room.  After obtaining informed consent, the colonoscope was                        passed under direct vision. Throughout the procedure,                        the patient's blood pressure, pulse, and oxygen                        saturations were monitored continuously. The                 Colonoscope was introduced through the anus and                        advanced to the the cecum, identified by appendiceal                        orifice and ileocecal valve. The colonoscopy was                        performed with ease. The patient tolerated the                        procedure well. The quality of the bowel preparation                        was good. Findings:      The perianal and digital rectal examinations were normal.      A tattoo was seen in the ascending colon. The tattoo site appeared       normal.      A 4 mm polyp was found in the transverse colon. The polyp was sessile.       The polyp was removed with a cold biopsy forceps. Resection and       retrieval were complete.      The exam was otherwise without abnormality.      The rectum, sigmoid colon, descending colon, transverse colon, ascending       colon and cecum appeared normal.      The retroflexed view of the distal rectum and anal verge was normal and       showed no anal or rectal abnormalities. Impression:           - A tattoo was seen in the ascending colon. The tattoo                        site appeared normal.                       - One 4 mm polyp in the transverse colon, removed with                        a cold biopsy forceps. Resected and retrieved.                       - The examination was otherwise normal.                       - The rectum, sigmoid colon, descending colon,  transverse colon, ascending colon and cecum are normal.                       - The distal rectum and anal verge are normal on                        retroflexion view. Recommendation:       - Discharge patient to home (with escort).                       - Advance diet as tolerated.                       - Continue present medications.                       - Await pathology results.                       - Repeat colonoscopy in 3 years, due to history of                         large polyp on previous procedure.                       - The findings and recommendations were discussed with                        the patient.                       - The findings and recommendations were discussed with                        the patient's family.                       - Return to primary care physician as previously                        scheduled. Procedure Code(s):    --- Professional ---                       [phone], Colonoscopy, flexible; with biopsy, single or                        multiple Diagnosis Code(s):    --- Professional ---                       Z86.010, Personal history of colonic polyps                       K63.5, Polyp of colon CPT copyright 2019 American Medical Association. All rights reserved. The codes documented in this report are preliminary and upon coder review may  be revised to meet current compliance requirements.  Vonda Antigua, MD Margretta Sidle B. Bonna Gains MD, MD 08/15/2019 9:13:49 AM This report has been signed electronically. Number of Addenda: 0 Note Initiated On: 08/15/2019 7:29 AM Scope Withdrawal Time: 0 hours 15 minutes 54 seconds  Total Procedure Duration: 0 hours 23 minutes 2 seconds  Estimated Blood Loss: Estimated blood loss: none.      [facility]

## 2019-08-15 NOTE — Anesthesia Postprocedure Evaluation (Signed)
Anesthesia Post Note  Patient: [name]  Procedure(s) Performed: COLONOSCOPY WITH PROPOFOL (N/A ) ESOPHAGOGASTRODUODENOSCOPY (EGD) WITH PROPOFOL (N/A )  Patient location during evaluation: Endoscopy Anesthesia Type: General Level of consciousness: awake and alert Pain management: pain level controlled Vital Signs Assessment: post-procedure vital signs reviewed and stable Respiratory status: spontaneous breathing, nonlabored ventilation, respiratory function stable and patient connected to nasal cannula oxygen Cardiovascular status: blood pressure returned to baseline and stable Postop Assessment: no apparent nausea or vomiting Anesthetic complications: no     Last Vitals:  Vitals:   08/15/19 0930 08/15/19 0940  BP: 117/74 119/68  Pulse: 79 75  Resp: 19 16  Temp:    SpO2: 100% 99%    Last Pain:  Vitals:   08/15/19 0940  TempSrc:   PainSc: 0-No pain                 Precious Haws Christyn Gutkowski

## 2019-08-15 NOTE — Anesthesia Post-op Follow-up Note (Signed)
Anesthesia QCDR form completed.        

## 2019-08-15 NOTE — Op Note (Signed)
[facility] Gastroenterology Patient Name: [name] Procedure Date: 08/15/2019 7:30 AM MRN: [id number] Account #: [id number] Date of Birth: 02-Feb-1946 Admit Type: Outpatient Age: 73 Room: [facility] ENDO ROOM 1 Gender: Female Note Status: Finalized Procedure:            Upper GI endoscopy Indications:          Follow-up of gastric polyps Providers:            Wynton Hufstetler B. Bonna Gains MD, MD Referring MD:         Elveria Rising. Damita Dunnings, MD (Referring MD) Medicines:            Monitored Anesthesia Care Complications:        No immediate complications. Procedure:            Pre-Anesthesia Assessment:                       - Prior to the procedure, a History and Physical was                        performed, and patient medications, allergies and                        sensitivities were reviewed. The patient's tolerance of                        previous anesthesia was reviewed.                       - The risks and benefits of the procedure and the                        sedation options and risks were discussed with the                        patient. All questions were answered and informed                        consent was obtained.                       - Patient identification and proposed procedure were                        verified prior to the procedure by the physician, the                        nurse, the anesthesiologist, the anesthetist and the                        technician. The procedure was verified in the procedure                        room.                       - ASA Grade Assessment: II - A patient with mild                        systemic disease.  After obtaining informed consent, the endoscope was                        passed under direct vision. Throughout the procedure,                        the patient's blood pressure, pulse, and oxygen                        saturations were monitored continuously. The Endoscope                         was introduced through the mouth, and advanced to the                        second part of duodenum. The upper GI endoscopy was                        accomplished with ease. The patient tolerated the                        procedure well. Findings:      The examined esophagus was normal.      Patchy mildly erythematous mucosa without bleeding was found in the       gastric antrum. Biopsies were taken with a cold forceps for histology.       Biopsies were obtained in the gastric body, at the incisura and in the       gastric antrum with cold forceps for histology.      Multiple 3 to 5 mm sessile polyps with one of them appeared to have old       blood/hematin around it. This was biopsied. and stigmata of recent       bleeding were found in the gastric body. Biopsies were taken with a cold       forceps for histology. No active bleeding was present before or after       the procedure. Two of the largest polyps were biopsied.      The duodenal bulb, second portion of the duodenum and examined duodenum       were normal. Impression:           - Normal esophagus.                       - Erythematous mucosa in the antrum. Biopsied.                       - Multiple gastric polyps. Biopsied.                       - Normal duodenal bulb, second portion of the duodenum                        and examined duodenum.                       - Biopsies were obtained in the gastric body, at the                        incisura and in the gastric antrum. Recommendation:       - Await pathology  results.                       - Discharge patient to home (with escort).                       - Advance diet as tolerated.                       - Continue present medications.                       - Patient has a contact number available for                        emergencies. The signs and symptoms of potential                        delayed complications were discussed with the patient.                         Return to normal activities tomorrow. Written discharge                        instructions were provided to the patient.                       - Discharge patient to home (with escort).                       - The findings and recommendations were discussed with                        the patient.                       - The findings and recommendations were discussed with                        the patient's family. Procedure Code(s):    --- Professional ---                       [phone], Esophagogastroduodenoscopy, flexible, transoral;                        with biopsy, single or multiple Diagnosis Code(s):    --- Professional ---                       K31.89, Other diseases of stomach and duodenum                       K31.7, Polyp of stomach and duodenum CPT copyright 2019 American Medical Association. All rights reserved. The codes documented in this report are preliminary and upon coder review may  be revised to meet current compliance requirements.  Vonda Antigua, MD Margretta Sidle B. Bonna Gains MD, MD 08/15/2019 8:36:35 AM This report has been signed electronically. Number of Addenda: 0 Note Initiated On: 08/15/2019 7:30 AM Estimated Blood Loss: Estimated blood loss: none.      [facility]

## 2019-08-15 NOTE — Anesthesia Procedure Notes (Signed)
Date/Time: 08/15/2019 8:05 AM Performed by: Allean Found, CRNA Pre-anesthesia Checklist: Patient identified, Emergency Drugs available, Suction available, Patient being monitored and Timeout performed Patient Re-evaluated:Patient Re-evaluated prior to induction Oxygen Delivery Method: Nasal cannula Placement Confirmation: positive ETCO2

## 2019-08-16 ENCOUNTER — Encounter: Payer: Self-pay | Admitting: Gastroenterology

## 2019-08-16 LAB — SURGICAL PATHOLOGY

## 2019-08-19 ENCOUNTER — Encounter: Payer: Self-pay | Admitting: Gastroenterology

## 2019-08-22 ENCOUNTER — Telehealth: Payer: Self-pay

## 2019-08-22 DIAGNOSIS — H00012 Hordeolum externum right lower eyelid: Secondary | ICD-10-CM | POA: Diagnosis not present

## 2019-08-22 NOTE — Telephone Encounter (Signed)
Patient verbalized understanding  

## 2019-08-22 NOTE — Telephone Encounter (Signed)
Patient is calling because she states she received a letter about the biopsy from her colonoscopy. Patient states that she also had biopsy done on her EGD. Patient would like to know those results.

## 2019-08-29 DIAGNOSIS — Z961 Presence of intraocular lens: Secondary | ICD-10-CM | POA: Diagnosis not present

## 2019-10-03 ENCOUNTER — Ambulatory Visit: Payer: PPO | Admitting: Gastroenterology

## 2019-10-09 ENCOUNTER — Other Ambulatory Visit: Payer: Self-pay

## 2019-10-09 ENCOUNTER — Ambulatory Visit: Payer: PPO | Admitting: Gastroenterology

## 2019-10-09 ENCOUNTER — Encounter: Payer: Self-pay | Admitting: Gastroenterology

## 2019-10-09 VITALS — BP 154/82 | HR 83 | Temp 97.9°F | Wt 122.2 lb

## 2019-10-09 DIAGNOSIS — K219 Gastro-esophageal reflux disease without esophagitis: Secondary | ICD-10-CM | POA: Diagnosis not present

## 2019-10-09 MED ORDER — OMEPRAZOLE 20 MG PO CPDR: 20.0000 mg | DELAYED_RELEASE_CAPSULE | Freq: Every day | ORAL | 0 refills | Status: DC

## 2019-10-09 NOTE — Progress Notes (Signed)
omepr

## 2019-10-09 NOTE — Progress Notes (Signed)
Karen Antigua, MD [address]  [place]  [place], [place] [address]  Main: [phone]  Fax: [phone]   Primary Care Physician: Tonia Ghent, MD   Chief complaint: Previous history of GI bleed and large colon polyp, here for follow-up  HPI: [name] is a 73 y.o. female initially seen during hospitalization in July 2020 with hematochezia with stable hemoglobin and found to have a large 25 mm ascending colon polyp, semipedunculated that was attributed to be the cause of her hematochezia.  This was removed and symptoms have not reoccurred since then.  The patient denies abdominal or flank pain, anorexia, nausea or vomiting, dysphagia, change in bowel habits or black or bloody stools or weight loss.  Patient also taking omeprazole 40 mg once daily for chronic history of acid reflux and has not tried a lower dose before.  Latest EGD and colonoscopy in October 2020 EGD showed gastric erythema.  3 to 5 mm gastric polyps which were biopsied.  Biopsy showed benign fundic gland polyps.  No H. pylori Biopsies.  Colonoscopy with tattoo site seen in the ascending colon and appeared normal.  4 mm transverse colon polyp removed.  Tubular adenoma seen on biopsies.  Repeat recommended in 3 years.  Previous history: She underwent EGD and colonoscopy on May 18, 2019.  EGD showed few gastric polyps and was otherwise normal.  Biopsies were not done of the polyps due to patient's bleeding during the admission.  Colonoscopy showed a large 25 mm polyp in the ascending colon, semi-pedunculated, removed completely with a hot snare and clips were placed to prevent bleeding.  A tattoo was placed immediately distal to the polyp as well.  4 mm sigmoid polyp was also seen and removed with jumbo forceps.  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  Negative for high-grade dysplasia and malignancy.  Sigmoid colon  polyp showed tubular adenoma.  Polyp size on pathology noted to be 3.7 cm in largest size  Current Outpatient Medications  Medication Sig Dispense Refill  . Ascorbic Acid (VITAMIN C) 500 MG tablet Take 2,000 mg by mouth daily. Reported on 02/04/2016    . Biotin (PA BIOTIN) 1000 MCG tablet Take 1,000 mcg by mouth daily. Reported on 02/04/2016    . CALCIUM-MAG-VIT C-VIT D PO Take by mouth daily.    . Cholecalciferol (VITAMIN D-3) 1000 units CAPS Take 1 capsule by mouth daily with supper.     . Coenzyme Q10 200 MG capsule Take 200 mg by mouth daily.    . Folic Acid-Vit Z3-YQM V78 (FA-VITAMIN B-6-VITAMIN B-12 PO) Take 1 tablet by mouth daily after breakfast. Reported on 02/04/2016    . levothyroxine (SYNTHROID, LEVOTHROID) 88 MCG tablet Take 1 tablet (88 mcg total) by mouth daily before breakfast. 90 tablet 3  . Lutein 20 MG CAPS Take 20 mg by mouth daily.    . Omega-3 Fatty Acids (SUPER OMEGA 3 EPA/DHA) 1000 MG CAPS Take 1,000 mg by mouth daily with supper.    . ondansetron (ZOFRAN) 4 MG tablet Take 1 tablet (4 mg total) by mouth every 8 (eight) hours as needed for up to 3 doses for nausea or vomiting. 3 tablet 0  . Potassium Gluconate 595 MG CAPS Take 595 mg by mouth daily.     . pravastatin (PRAVACHOL) 40 MG tablet Take 1 tablet (40 mg total) by mouth daily. 90 tablet 3  . omeprazole (PRILOSEC) 20 MG capsule  Take 1 capsule (20 mg total) by mouth daily. 90 capsule 0   No current facility-administered medications for this visit.     Allergies as of 10/09/2019 - Review Complete 10/09/2019  Allergen Reaction Noted  . Atorvastatin  07/20/2010  . Ciprofloxacin  08/14/2007  . Epinephrine  04/26/2012  . Metoprolol succinate Swelling 03/16/2011  . Simvastatin Other (See Comments) 03/16/2011  . Sulfa antibiotics Other (See Comments) 08/14/2007  . Sulfonamide derivatives  08/14/2007    ROS:  General: Negative for anorexia, weight loss, fever, chills, fatigue, weakness. ENT: Negative for hoarseness,  difficulty swallowing , nasal congestion. CV: Negative for chest pain, angina, palpitations, dyspnea on exertion, peripheral edema.  Respiratory: Negative for dyspnea at rest, dyspnea on exertion, cough, sputum, wheezing.  GI: See history of present illness. GU:  Negative for dysuria, hematuria, urinary incontinence, urinary frequency, nocturnal urination.  Endo: Negative for unusual weight change.    Physical Examination:   BP (!) 154/82 (BP Location: Left Arm, Patient Position: Sitting, Cuff Size: Normal)   Pulse 83   Temp 97.9 F (36.6 C) (Oral)   Wt 122 lb 4 oz (55.5 kg)   BMI 20.34 kg/m   General: Well-nourished, well-developed in no acute distress.  Eyes: No icterus. Conjunctivae pink. Mouth: Oropharyngeal mucosa moist and pink , no lesions erythema or exudate. Neck: Supple, Trachea midline Abdomen: Bowel sounds are normal, nontender, nondistended, no hepatosplenomegaly or masses, no abdominal bruits or hernia , no rebound or guarding.   Extremities: No lower extremity edema. No clubbing or deformities. Neuro: Alert and oriented x 3.  Grossly intact. Skin: Warm and dry, no jaundice.   Psych: Alert and cooperative, normal mood and affect.   Labs: CMP     Component Value Date/Time   NA 140 06/24/2019 0744   NA 141 07/01/2013 0103   K 4.7 06/24/2019 0744   K 3.8 07/01/2013 0103   CL 105 06/24/2019 0744   CL 110 (H) 07/01/2013 0103   CO2 28 06/24/2019 0744   CO2 27 07/01/2013 0103   GLUCOSE 97 06/24/2019 0744   GLUCOSE 88 07/01/2013 0103   BUN 16 06/24/2019 0744   BUN 11 07/01/2013 0103   CREATININE 0.72 06/24/2019 0744   CREATININE 0.68 07/01/2013 0103   CALCIUM 9.2 06/24/2019 0744   CALCIUM 8.2 (L) 07/01/2013 0103   PROT 6.7 06/24/2019 0744   PROT 5.5 (L) 07/01/2013 0103   ALBUMIN 4.6 06/24/2019 0744   ALBUMIN 3.0 (L) 07/01/2013 0103   AST 18 06/24/2019 0744   AST 19 07/01/2013 0103   ALT 14 06/24/2019 0744   ALT 24 07/01/2013 0103   ALKPHOS 51 06/24/2019  0744   ALKPHOS 55 07/01/2013 0103   BILITOT 0.6 06/24/2019 0744   BILITOT 0.7 07/01/2013 0103   GFRNONAA >60 05/19/2019 0117   GFRNONAA >60 07/01/2013 0103   GFRAA >60 05/19/2019 0117   GFRAA >60 07/01/2013 0103   Lab Results  Component Value Date   WBC 6.3 06/24/2019   HGB 13.9 06/24/2019   HCT 42.2 06/24/2019   MCV 91.6 06/24/2019   PLT 122.0 Repeated and verified X2. (L) 06/24/2019    Imaging Studies: No results found.  Assessment and Plan:   [name] is a 73 y.o. y/o female here for follow-up of history of colon polyps and reflux  No further symptoms of hematochezia since removal of colon polyp in July 2020 Repeat colonoscopy in 3 years  Patient willing to try lower dose of omeprazole.  Will send to pharmacy  If symptoms remain well controlled, discontinue omeprazole after 1 to 2 months.  It was discussed that this can help reverse the benign gastric fundic polyp seen on endoscopy  Patient educated extensively on acid reflux lifestyle modification, including buying a bed wedge, not eating 3 hrs before bedtime, diet modifications, and handout given for the same.      Dr [name]

## 2019-10-10 ENCOUNTER — Other Ambulatory Visit: Payer: Self-pay | Admitting: Internal Medicine

## 2019-10-10 DIAGNOSIS — C73 Malignant neoplasm of thyroid gland: Secondary | ICD-10-CM

## 2019-10-10 DIAGNOSIS — C3431 Malignant neoplasm of lower lobe, right bronchus or lung: Secondary | ICD-10-CM

## 2019-11-11 DIAGNOSIS — Z7689 Persons encountering health services in other specified circumstances: Secondary | ICD-10-CM | POA: Diagnosis not present

## 2019-11-12 ENCOUNTER — Encounter: Payer: Self-pay | Admitting: Internal Medicine

## 2019-11-13 ENCOUNTER — Other Ambulatory Visit: Payer: Self-pay

## 2019-11-13 ENCOUNTER — Ambulatory Visit
Admission: RE | Admit: 2019-11-13 | Discharge: 2019-11-13 | Disposition: A | Discharge status: Home / Self Care | Payer: PPO | Source: Ambulatory Visit | Attending: Internal Medicine | Admitting: Internal Medicine

## 2019-11-13 DIAGNOSIS — C3431 Malignant neoplasm of lower lobe, right bronchus or lung: Secondary | ICD-10-CM

## 2019-11-13 DIAGNOSIS — C3490 Malignant neoplasm of unspecified part of unspecified bronchus or lung: Secondary | ICD-10-CM | POA: Diagnosis not present

## 2019-11-18 ENCOUNTER — Other Ambulatory Visit: Payer: Self-pay

## 2019-11-18 ENCOUNTER — Encounter: Payer: Self-pay | Admitting: Internal Medicine

## 2019-11-18 NOTE — Progress Notes (Signed)
Patient pre screened for office appointment, no questions or concerns today. Patient reminded of upcoming appointment time and date. 

## 2019-11-19 ENCOUNTER — Inpatient Hospital Stay: Payer: PPO | Attending: Internal Medicine | Admitting: Internal Medicine

## 2019-11-19 ENCOUNTER — Other Ambulatory Visit: Payer: Self-pay

## 2019-11-19 VITALS — BP 133/74 | HR 79 | Temp 96.9°F | Wt 123.0 lb

## 2019-11-19 DIAGNOSIS — C73 Malignant neoplasm of thyroid gland: Secondary | ICD-10-CM | POA: Insufficient documentation

## 2019-11-19 DIAGNOSIS — Z79899 Other long term (current) drug therapy: Secondary | ICD-10-CM | POA: Diagnosis not present

## 2019-11-19 DIAGNOSIS — M8580 Other specified disorders of bone density and structure, unspecified site: Secondary | ICD-10-CM | POA: Diagnosis not present

## 2019-11-19 DIAGNOSIS — C3431 Malignant neoplasm of lower lobe, right bronchus or lung: Secondary | ICD-10-CM | POA: Diagnosis not present

## 2019-11-19 NOTE — Progress Notes (Signed)
[facility] OFFICE PROGRESS NOTE  Patient Care Team: Tonia Ghent, MD as PCP - General (Family Medicine) Beverly Gust, MD as Consulting Physician (Otolaryngology) Nestor Lewandowsky, MD as Consulting Physician (Cardiothoracic Surgery) Cammie Sickle, MD as Consulting Physician (Internal Medicine) Birder Robson, MD as Consulting Physician (Ophthalmology) Clayburn Pert, MD as Consulting Physician (General Surgery)  Cancer Staging Thyroid cancer [facility]) Staging form: Thyroid - Papillary or Follicular (Under 45 years), AJCC 7th Edition - Clinical: Stage I (T1, N0, M0) - Signed by Forest Gleason, MD on 09/11/2015    Oncology History Overview Note  # NOV 2016- RLL [place] ca T1N0 [incidental; Dr.Simonds; Bronc- s/p RLlobectomy; Dr.Oaks;Dec 2016]  # FEB 2017- 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 STAGE I; s/p Total Thyroidetcomy [Dr.Chapman];NO RAIU.  LOW RISK; but detectable thyroglobulin- on Synthroid  #Survivorship-pending  DIAGNOSIS: Lung cancer -stage I #thyroid cancer low risk  GOALS: Cure  CURRENT/MOST RECENT THERAPY: Surveillance    Thyroid cancer (HCC)  Cancer of lower lobe of right lung ([place])  05/06/2016 Initial Diagnosis   Malignant neoplasm of lower lobe, right bronchus or lung (HCC)       INTERVAL HISTORY:  [name] 74 y.o.  female pleasant patient above history of stage I lung cancer and also stage I thyroid cancer currently on surveillance is here for follow-up.  Patient appetite is good.  No weight loss.  No headaches.  No nausea vomiting.  No palpitations.  Or hot flashes.  Review of Systems  Constitutional: Positive for malaise/fatigue. Negative for chills, diaphoresis, fever and weight loss.  HENT: Negative for nosebleeds and sore throat.   Eyes: Negative for double vision.  Respiratory: Negative for cough, hemoptysis, sputum production, shortness of breath  and wheezing.   Cardiovascular: Negative for chest pain, palpitations, orthopnea and leg swelling.  Gastrointestinal: Negative for abdominal pain, blood in stool, constipation, diarrhea, heartburn, melena and vomiting.  Genitourinary: Negative for dysuria, frequency and urgency.  Musculoskeletal: Positive for back pain. Negative for joint pain.  Skin: Negative.  Negative for itching and rash.  Neurological: Positive for tingling. Negative for dizziness, focal weakness, weakness and headaches.  Endo/Heme/Allergies: Does not bruise/bleed easily.  Psychiatric/Behavioral: Negative for depression. The patient is not nervous/anxious and does not have insomnia.       PAST MEDICAL HISTORY :  Past Medical History:  Diagnosis Date  . Adenocarcinoma of right lung ([place]) 10/01/2015  . GERD (gastroesophageal reflux disease)   . GIB (gastrointestinal bleeding)   . Groin pain    Along superior/laterl R inguinal canal.  Could be due to hernia or old scar tissue.  prev with CT done w/o alarming findings.  Will treat episodically.  Use tylenol #3 prn.    . Hyperlipidemia   . Lipoma of thigh    Left  . Normal cardiac stress test 2014  . PONV (postoperative nausea and vomiting)    nausea after hysterectomy, and 2 days after lung surgery 10/2015  . Thyroid cancer ([place]) 12/2015  . Thyroid nodule     PAST SURGICAL HISTORY :   Past Surgical History:  Procedure Laterality Date  . BREAST EXCISIONAL BIOPSY Right 1977  . BREAST MASS EXCISION  1977   benign  . CARPAL TUNNEL RELEASE Right 1978  . CATARACT EXTRACTION, BILATERAL Bilateral   . CHOLECYSTECTOMY  11/23/2016   Procedure: LAPAROSCOPIC CHOLECYSTECTOMY;  Surgeon: Clayburn Pert, MD;  Location: [facility] ORS;  Service: General;;  . COLONOSCOPY  2009  .  COLONOSCOPY WITH PROPOFOL N/A 05/18/2019   Procedure: COLONOSCOPY WITH PROPOFOL;  Surgeon: Virgel Manifold, MD;  Location: [facility] ENDOSCOPY;  Service: Endoscopy;  Laterality: N/A;  . COLONOSCOPY WITH  PROPOFOL N/A 08/15/2019   Procedure: COLONOSCOPY WITH PROPOFOL;  Surgeon: Virgel Manifold, MD;  Location: [facility] ENDOSCOPY;  Service: Endoscopy;  Laterality: N/A;  . DILATION AND CURETTAGE OF UTERUS  2001  . ELECTROMAGNETIC NAVIGATION BROCHOSCOPY N/A 08/25/2015   Procedure: ELECTROMAGNETIC NAVIGATION BRONCHOSCOPY;  Surgeon: Flora Lipps, MD;  Location: [facility] ORS;  Service: Cardiopulmonary;  Laterality: N/A;  . ENDOBRONCHIAL ULTRASOUND N/A 08/25/2015   Procedure: ENDOBRONCHIAL ULTRASOUND;  Surgeon: Flora Lipps, MD;  Location: [facility] ORS;  Service: Cardiopulmonary;  Laterality: N/A;  . ESOPHAGOGASTRODUODENOSCOPY  2002, 11/15/07   Normal (Dr. Allyn Kenner)  . ESOPHAGOGASTRODUODENOSCOPY (EGD) WITH PROPOFOL N/A 05/18/2019   Procedure: ESOPHAGOGASTRODUODENOSCOPY (EGD) WITH PROPOFOL;  Surgeon: Virgel Manifold, MD;  Location: [facility] END[facility];  Service: Endoscopy;  Laterality: N/A;  . ESOPHAGOGASTRODUODENOSCOPY (EGD) WITH PROPOFOL N/A 08/15/2019   Procedure: ESOPHAGOGASTRODUODENOSCOPY (EGD) WITH PROPOFOL;  Surgeon: Virgel Manifold, MD;  Location: [facility] END[facility];  Service: Endoscopy;  Laterality: N/A;  . FACIAL COSMETIC SURGERY  01/13/10   mini facelift  . HERNIA REPAIR  1976  . THORACOTOMY/LOBECTOMY Right 10/27/2015   Procedure: THORACOTOMY/LOBECTOMY;  Surgeon: Nestor Lewandowsky, MD;  Location: [facility] ORS;  Service: Thoracic;  Laterality: Right;  . THYROIDECTOMY N/A 12/29/2015   Procedure: THYROIDECTOMY;  Surgeon: Beverly Gust, MD;  Location: [facility] ORS;  Service: ENT;  Laterality: N/A;  . TOTAL VAGINAL HYSTERECTOMY  12/30/08   for fibroid pain (Dr. Gertie Fey)    FAMILY HISTORY :   Family History  Problem Relation Age of Onset  . Transient ischemic attack Mother   . Hypertension Mother   . Stroke Mother        mini strokes  . Hypertension Sister   . Cancer Sister        ovarian  . Hypertension Sister   . Hypertension Sister   . Hypertension Sister   . Breast cancer Neg Hx   . Colon cancer Neg Hx      SOCIAL HISTORY:   Social History   Tobacco Use  . Smoking status: Never Smoker  . Smokeless tobacco: Never Used  Substance Use Topics  . Alcohol use: No    Alcohol/week: 0.0 standard drinks  . Drug use: No    ALLERGIES:  is allergic to atorvastatin; ciprofloxacin; epinephrine; metoprolol succinate; simvastatin; sulfa antibiotics; and sulfonamide derivatives.  MEDICATIONS:  Current Outpatient Medications  Medication Sig Dispense Refill  . Ascorbic Acid (VITAMIN C) 500 MG tablet Take 2,000 mg by mouth daily. Reported on 02/04/2016    . Biotin (PA BIOTIN) 1000 MCG tablet Take 1,000 mcg by mouth daily. Reported on 02/04/2016    . CALCIUM-MAG-VIT C-VIT D PO Take by mouth daily.    . Cholecalciferol (VITAMIN D-3) 1000 units CAPS Take 1 capsule by mouth daily with supper.     . Coenzyme Q10 200 MG capsule Take 200 mg by mouth daily.    . Folic Acid-Vit Y3-KZS W10 (FA-VITAMIN B-6-VITAMIN B-12 PO) Take 1 tablet by mouth daily after breakfast. Reported on 02/04/2016    . levothyroxine (SYNTHROID) 88 MCG tablet TAKE 1 TABLET (88 MCG TOTAL) BY MOUTH DAILY BEFORE BREAKFAST. 90 tablet 3  . Lutein 20 MG CAPS Take 20 mg by mouth daily.    . Omega-3 Fatty Acids (SUPER OMEGA 3 EPA/DHA) 1000 MG CAPS Take 1,000 mg by mouth daily with supper.    Marland Kitchen  omeprazole (PRILOSEC) 20 MG capsule Take 1 capsule (20 mg total) by mouth daily. 90 capsule 0  . ondansetron (ZOFRAN) 4 MG tablet Take 1 tablet (4 mg total) by mouth every 8 (eight) hours as needed for up to 3 doses for nausea or vomiting. 3 tablet 0  . Potassium Gluconate 595 MG CAPS Take 595 mg by mouth daily.     . pravastatin (PRAVACHOL) 40 MG tablet Take 1 tablet (40 mg total) by mouth daily. 90 tablet 3   No current facility-administered medications for this visit.    PHYSICAL EXAMINATION: ECOG PERFORMANCE STATUS: 1 - Symptomatic but completely ambulatory  BP 133/74 (BP Location: Right Arm, Patient Position: Sitting)   Pulse 79   Temp (!) 96.9 F  (36.1 C) (Tympanic)   Wt 123 lb (55.8 kg)   BMI 20.47 kg/m   Filed Weights   11/19/19 1350  Weight: 123 lb (55.8 kg)    Physical Exam  Constitutional: She is oriented to person, place, and time and well-developed, well-nourished, and in no distress.  HENT:  Head: Normocephalic and atraumatic.  Mouth/Throat: Oropharynx is clear and moist. No oropharyngeal exudate.  Eyes: Pupils are equal, round, and reactive to light.  Cardiovascular: Normal rate and regular rhythm.  Pulmonary/Chest: No respiratory distress. She has no wheezes.  Abdominal: Soft. Bowel sounds are normal. She exhibits no distension and no mass. There is no abdominal tenderness. There is no rebound and no guarding.  Musculoskeletal:        General: No tenderness or edema. Normal range of motion.     Cervical back: Normal range of motion and neck supple.  Neurological: She is alert and oriented to person, place, and time.  Skin: Skin is warm.  Psychiatric: Affect normal.   LABORATORY DATA:  I have reviewed the data as listed    Component Value Date/Time   NA 140 06/24/2019 0744   NA 141 07/01/2013 0103   K 4.7 06/24/2019 0744   K 3.8 07/01/2013 0103   CL 105 06/24/2019 0744   CL 110 (H) 07/01/2013 0103   CO2 28 06/24/2019 0744   CO2 27 07/01/2013 0103   GLUCOSE 97 06/24/2019 0744   GLUCOSE 88 07/01/2013 0103   BUN 16 06/24/2019 0744   BUN 11 07/01/2013 0103   CREATININE 0.72 06/24/2019 0744   CREATININE 0.68 07/01/2013 0103   CALCIUM 9.2 06/24/2019 0744   CALCIUM 8.2 (L) 07/01/2013 0103   PROT 6.7 06/24/2019 0744   PROT 5.5 (L) 07/01/2013 0103   ALBUMIN 4.6 06/24/2019 0744   ALBUMIN 3.0 (L) 07/01/2013 0103   AST 18 06/24/2019 0744   AST 19 07/01/2013 0103   ALT 14 06/24/2019 0744   ALT 24 07/01/2013 0103   ALKPHOS 51 06/24/2019 0744   ALKPHOS 55 07/01/2013 0103   BILITOT 0.6 06/24/2019 0744   BILITOT 0.7 07/01/2013 0103   GFRNONAA >60 05/19/2019 0117   GFRNONAA >60 07/01/2013 0103   GFRAA >60  05/19/2019 0117   GFRAA >60 07/01/2013 0103    No results found for: SPEP, UPEP  Lab Results  Component Value Date   WBC 6.3 06/24/2019   NEUTROABS 4.1 06/24/2019   HGB 13.9 06/24/2019   HCT 42.2 06/24/2019   MCV 91.6 06/24/2019   PLT 122.0 Repeated and verified X2. (L) 06/24/2019      Chemistry      Component Value Date/Time   NA 140 06/24/2019 0744   NA 141 07/01/2013 0103   K 4.7 06/24/2019 0744  K 3.8 07/01/2013 0103   CL 105 06/24/2019 0744   CL 110 (H) 07/01/2013 0103   CO2 28 06/24/2019 0744   CO2 27 07/01/2013 0103   BUN 16 06/24/2019 0744   BUN 11 07/01/2013 0103   CREATININE 0.72 06/24/2019 0744   CREATININE 0.68 07/01/2013 0103      Component Value Date/Time   CALCIUM 9.2 06/24/2019 0744   CALCIUM 8.2 (L) 07/01/2013 0103   ALKPHOS 51 06/24/2019 0744   ALKPHOS 55 07/01/2013 0103   AST 18 06/24/2019 0744   AST 19 07/01/2013 0103   ALT 14 06/24/2019 0744   ALT 24 07/01/2013 0103   BILITOT 0.6 06/24/2019 0744   BILITOT 0.7 07/01/2013 0103       RADIOGRAPHIC STUDIES: I have personally reviewed the radiological images as listed and agreed with the findings in the report. No results found.   ASSESSMENT & PLAN:  Cancer of lower lobe of right lung ([place]) # RLL Lung ca; Stage I;  no adjuvant therapy. JAN 13th 2021-no obvious evidence of recurrence; however right upper lobe groundglass opacity with 3 mm [see discussion below].  Stable  #Groundglass opacity-11 mm/3 mm solid nodule-question low-grade malignancy versus others.  Recommend follow-up imaging in 6 months.   # Thyroid cancer status post thyroidectomy low risk stage I; on Synthroid 88 mcg.  January 2021 -TSH- 0.5; detectable thyroglobulin-Elevated 9 [September 2020-6].  Positive for thyroglobulin antibody.  Monitor for now.  # BMD June 2017- T score= 2.2; Osteopenia/ hypocalcemia- vit D+ ca BID. Will need other BMD now.   Discussed the potential risk factors; and treatment options- fosomax/ boniva;  infusions. Pt awaiting to speak to PCP for further recommendations.    # # I discussed regarding Covid-19 precautions.  I reviewed the vaccine effectiveness and potential side effects in detail.  Also discussed long-term effectiveness and safety profile are unclear at this time.  I discussed December, 2020 [employer] position statement-that all patients are recommended COVID-19 vaccinations [when available]-as long as they do not have allergy to components of the vaccine.  However, I think the benefits of the vaccination outweigh the potential risks. Re: [id number] vaccination.     #  DISPOSITION:  # follow up in 6 months-MD [[facility]/ thyroid profile/ Thyroiglobulin/ antibodies- 1 week prior]; CT chest prior- Dr.B  # I reviewed the blood work- with the patient in detail; also reviewed the imaging independently [as summarized above]; and with the patient in detail.        Orders Placed This Encounter  Procedures  . CT CHEST WO CONTRAST    Standing Status:   Future    Standing Expiration Date:   11/18/2020    Order Specific Question:   Preferred imaging location?    Answer:   [facility]    Order Specific Question:   Radiology Contrast Protocol - do NOT remove file path    Answer:   [email or web address]    Order Specific Question:   ** REASON FOR EXAM (FREE TEXT)    Answer:   lung cancer   All questions were answered. The patient knows to call the clinic with any problems, questions or concerns.      Cammie Sickle, MD 11/19/2019 2:35 PM

## 2019-11-19 NOTE — Assessment & Plan Note (Addendum)
#   RLL Lung ca; Stage I;  no adjuvant therapy. JAN 13th 2021-no obvious evidence of recurrence; however right upper lobe groundglass opacity with 3 mm [see discussion below].  Stable  #Groundglass opacity-11 mm/3 mm solid nodule-question low-grade malignancy versus others.  Recommend follow-up imaging in 6 months.   # Thyroid cancer status post thyroidectomy low risk stage I; on Synthroid 88 mcg.  January 2021 -TSH- 0.5; detectable thyroglobulin-Elevated 9 [September 2020-6].  Positive for thyroglobulin antibody.  Monitor for now.  # BMD June 2017- T score= 2.2; Osteopenia/ hypocalcemia- vit D+ ca BID. Will need other BMD now.   Discussed the potential risk factors; and treatment options- fosomax/ boniva; infusions. Pt awaiting to speak to PCP for further recommendations.    # # I discussed regarding Covid-19 precautions.  I reviewed the vaccine effectiveness and potential side effects in detail.  Also discussed long-term effectiveness and safety profile are unclear at this time.  I discussed December, 2020 ASCO position statement-that all patients are recommended COVID-19 vaccinations [when available]-as long as they do not have allergy to components of the vaccine.  However, I think the benefits of the vaccination outweigh the potential risks. Re: [id number] vaccination.     #  DISPOSITION:  # follow up in 6 months-MD [[facility]/ thyroid profile/ Thyroiglobulin/ antibodies- 1 week prior]; CT chest prior- Dr.B  # I reviewed the blood work- with the patient in detail; also reviewed the imaging independently [as summarized above]; and with the patient in detail.

## 2019-11-20 ENCOUNTER — Telehealth: Payer: Self-pay | Admitting: Family Medicine

## 2019-11-20 DIAGNOSIS — M8580 Other specified disorders of bone density and structure, unspecified site: Secondary | ICD-10-CM

## 2019-11-20 NOTE — Telephone Encounter (Signed)
LVM and sent msg.

## 2019-11-20 NOTE — Telephone Encounter (Signed)
Please check with patient.  Reasonable to proceed with DXA when patient wants to go.  Does she want to go in near future/this spring or push this back a little?  Please let me know.  Thanks.

## 2019-11-21 NOTE — Telephone Encounter (Signed)
I put in the order.  Thanks. 

## 2019-11-21 NOTE — Telephone Encounter (Signed)
Pt notified order is in and she can call them and schedule an appt now

## 2019-11-21 NOTE — Telephone Encounter (Signed)
Patient states she would like to go asap for DEXA scan. She states she would like to go to [place] to have this done. Will route to Dr. Damita Dunnings. Thanks!

## 2019-11-21 NOTE — Addendum Note (Signed)
Addended by: Tonia Ghent on: 11/21/2019 03:17 PM   Modules accepted: Orders

## 2019-11-22 ENCOUNTER — Ambulatory Visit: Payer: PPO | Admitting: Family Medicine

## 2019-11-27 ENCOUNTER — Ambulatory Visit
Admission: RE | Admit: 2019-11-27 | Discharge: 2019-11-27 | Disposition: A | Discharge status: Home / Self Care | Payer: PPO | Source: Ambulatory Visit | Attending: Family Medicine | Admitting: Family Medicine

## 2019-11-27 DIAGNOSIS — Z780 Asymptomatic menopausal state: Secondary | ICD-10-CM | POA: Diagnosis not present

## 2019-11-27 DIAGNOSIS — M8580 Other specified disorders of bone density and structure, unspecified site: Secondary | ICD-10-CM

## 2019-11-27 DIAGNOSIS — M8589 Other specified disorders of bone density and structure, multiple sites: Secondary | ICD-10-CM | POA: Diagnosis not present

## 2019-11-27 DIAGNOSIS — Z13820 Encounter for screening for osteoporosis: Secondary | ICD-10-CM | POA: Insufficient documentation

## 2019-11-29 ENCOUNTER — Encounter: Payer: Self-pay | Admitting: Family Medicine

## 2019-11-29 ENCOUNTER — Ambulatory Visit (INDEPENDENT_AMBULATORY_CARE_PROVIDER_SITE_OTHER): Payer: PPO | Admitting: Family Medicine

## 2019-11-29 ENCOUNTER — Other Ambulatory Visit: Payer: Self-pay

## 2019-11-29 VITALS — BP 130/76 | HR 85 | Temp 97.2°F | Ht 65.0 in | Wt 121.2 lb

## 2019-11-29 DIAGNOSIS — I700 Atherosclerosis of aorta: Secondary | ICD-10-CM

## 2019-11-29 DIAGNOSIS — I2510 Atherosclerotic heart disease of native coronary artery without angina pectoris: Secondary | ICD-10-CM

## 2019-11-29 DIAGNOSIS — Z7189 Other specified counseling: Secondary | ICD-10-CM

## 2019-11-29 DIAGNOSIS — I2584 Coronary atherosclerosis due to calcified coronary lesion: Secondary | ICD-10-CM

## 2019-11-29 DIAGNOSIS — M8580 Other specified disorders of bone density and structure, unspecified site: Secondary | ICD-10-CM | POA: Diagnosis not present

## 2019-11-29 DIAGNOSIS — K219 Gastro-esophageal reflux disease without esophagitis: Secondary | ICD-10-CM | POA: Diagnosis not present

## 2019-11-29 MED ORDER — ASPIRIN EC 81 MG PO TBEC: 81.0000 mg | DELAYED_RELEASE_TABLET | Freq: Every day | ORAL | Status: AC

## 2019-11-29 MED ORDER — OMEPRAZOLE 40 MG PO CPDR: 40.0000 mg | DELAYED_RELEASE_CAPSULE | Freq: Every day | ORAL | 3 refills | Status: DC

## 2019-11-29 NOTE — Patient Instructions (Addendum)
We'll call about seeing cardiology.   Don't change your meds for now.  Take care.  Glad to see you.

## 2019-11-29 NOTE — Assessment & Plan Note (Signed)
Advance directive- d/w pt. Has a living will. Husband, then patient's sister [name], then her niece [name] designated in that order should patient be incapacitated.  She doesn't want her son designated to make decisions.

## 2019-11-29 NOTE — Progress Notes (Signed)
This visit occurred during the SARS-CoV-2 public health emergency.  Safety protocols were in place, including screening questions prior to the visit, additional usage of staff PPE, and extensive cleaning of exam room while observing appropriate contact time as indicated for disinfecting solutions.  She didn't tolerate taper to 20mg  prilosec, she inc'd back to 40mg  with relief of GERD.    DXA d/w pt.  Defer tx at this point. Rationale d/w pt. she has osteopenia without osteoporosis and she has ongoing GERD symptoms so defer bisphosphonate treatment at this point.  Pulmonary nodule managed per Dr. Burlene Arnt.  CT with Aortic atherosclerosis. Lad coronary artery calcifications noted.  She doesn't have exertional sx but has had some atypical L sided chest pain prev.  Discussed options.   She has some occ nocturnal cramping/ankle pain.  Self resolved.  She can update me as needed.  Her son moved back in with patient, then went to rehab in 11/2019 for alcohol. He had to leave the house but didn't show up in rehab today.  Discussed.  Pfizer covid 1st shot done 11/21/19.    Advance directive- d/w pt. Has a living will. Husband, then patient's sister [name], then her niece [name] designated in that order should patient be incapacitated.  She doesn't want her son designated to make decisions.    Meds, vitals, and allergies reviewed.   ROS: Per HPI unless specifically indicated in ROS section   GEN: nad, alert and oriented HEENT: ncat NECK: supple w/o LA CV: rrr PULM: ctab, no inc wob ABD: soft, +bs EXT: no edema SKIN: no acute rash

## 2019-12-02 NOTE — Assessment & Plan Note (Addendum)
Pulmonary nodule managed per Dr. Burlene Arnt.  CT with Aortic atherosclerosis. Lad coronary artery calcifications noted.  She doesn't have exertional sx but has had some atypical L sided chest pain prev.  Discussed options.  She did not tolerate simvastatin but has been able to tolerate pravastatin, though her lipids are not yet at goal.  She still likely has some benefit from statin use even if her LDL is not below 70.  Reasonable to talk to cardiology.  Refer.  She agrees.  At least 30 minutes were devoted to patient care in this encounter (this can potentially include time spent reviewing the patient's file/history, interviewing and examining the patient, counseling/reviewing plan with patient, ordering referrals, ordering tests, reviewing relevant laboratory or x-ray data, and documenting the encounter).

## 2019-12-02 NOTE — Assessment & Plan Note (Addendum)
She didn't tolerate taper to 20mg  prilosec, she inc'd back to 40mg  with relief of GERD.   Would continue as is.

## 2019-12-02 NOTE — Assessment & Plan Note (Signed)
DXA d/w pt.  Defer tx at this point. Rationale d/w pt. she has osteopenia without osteoporosis and she has ongoing GERD symptoms so defer bisphosphonate treatment at this point.

## 2019-12-09 ENCOUNTER — Other Ambulatory Visit: Payer: Self-pay

## 2019-12-09 ENCOUNTER — Encounter: Payer: Self-pay | Admitting: Cardiology

## 2019-12-09 ENCOUNTER — Ambulatory Visit (INDEPENDENT_AMBULATORY_CARE_PROVIDER_SITE_OTHER): Payer: PPO | Admitting: Cardiology

## 2019-12-09 VITALS — BP 148/80 | HR 78 | Ht 64.0 in | Wt 123.2 lb

## 2019-12-09 DIAGNOSIS — R072 Precordial pain: Secondary | ICD-10-CM

## 2019-12-09 DIAGNOSIS — I2510 Atherosclerotic heart disease of native coronary artery without angina pectoris: Secondary | ICD-10-CM | POA: Diagnosis not present

## 2019-12-09 DIAGNOSIS — R079 Chest pain, unspecified: Secondary | ICD-10-CM

## 2019-12-09 DIAGNOSIS — E7800 Pure hypercholesterolemia, unspecified: Secondary | ICD-10-CM | POA: Diagnosis not present

## 2019-12-09 DIAGNOSIS — I2584 Coronary atherosclerosis due to calcified coronary lesion: Secondary | ICD-10-CM | POA: Diagnosis not present

## 2019-12-09 NOTE — Progress Notes (Signed)
Cardiology Office Note:    Date:  12/09/2019   ID:  [name], DOB 1946-09-03, MRN [id number]  PCP:  Tonia Ghent, MD  Cardiologist:  Kate Sable, MD  Electrophysiologist:  None   Referring MD: Tonia Ghent, MD   Chief Complaint  Patient presents with  . other    CAD c/o shoulder blade pain. Meds reviewed verbally with pt.    History of Present Illness:    [name] is a 74 y.o. female with a hx of hyperlipidemia, pulmonary nodule, who presents is due to coronary artery calcifications.  Patient with history of pulmonary nodule being monitored with chest CT.  Recent chest CT showed aortic atherosclerosis and also calcifications in the LAD.  She states having occasional left-sided chest pain which she has attributed to breast pain after mammograms.  Also have central bandlike chest pressure not related with exertion every now and then.  She is able to walk with her husband with no symptoms of chest pain or shortness of breath.  She denies palpitations, edema, presyncope or syncope.  She has a history of myalgias with statins(simvastatin, Lipitor) in the past.  Able to tolerate Pravachol.  Past Medical History:  Diagnosis Date  . 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 GERD (gastroesophageal reflux disease)   . GIB (gastrointestinal bleeding)   . Groin pain    Along superior/laterl R inguinal canal.  Could be due to hernia or old scar tissue.  prev with CT done w/o alarming findings.  Will treat episodically.  Use tylenol #3 prn.    . Hyperlipidemia   . Lipoma of thigh    Left  . Normal cardiac stress test 2014  . PONV (postoperative nausea and vomiting)    nausea after hysterectomy, and 2 days after lung surgery 10/2015  . Thyroid cancer ([place]) 12/2015  . Thyroid nodule     Past Surgical History:  Procedure Laterality Date  . ABDOMINAL HYSTERECTOMY    . BREAST EXCISIONAL BIOPSY Right 1977  . BREAST MASS EXCISION  1977   benign  . CARPAL TUNNEL  RELEASE Right 1978  . CATARACT EXTRACTION, BILATERAL Bilateral   . CHOLECYSTECTOMY  11/23/2016   Procedure: LAPAROSCOPIC CHOLECYSTECTOMY;  Surgeon: Clayburn Pert, MD;  Location: [facility];  Service: General;;  . COLONOSCOPY  2009  . COLONOSCOPY WITH PROPOFOL N/A 05/18/2019   Procedure: COLONOSCOPY WITH PROPOFOL;  Surgeon: Virgel Manifold, MD;  Location: [facility];  Service: Endoscopy;  Laterality: N/A;  . COLONOSCOPY WITH PROPOFOL N/A 08/15/2019   Procedure: COLONOSCOPY WITH PROPOFOL;  Surgeon: Virgel Manifold, MD;  Location: [facility];  Service: Endoscopy;  Laterality: N/A;  . DILATION AND CURETTAGE OF UTERUS  2001  . ELECTROMAGNETIC NAVIGATION BROCHOSCOPY N/A 08/25/2015   Procedure: ELECTROMAGNETIC NAVIGATION BRONCHOSCOPY;  Surgeon: Flora Lipps, MD;  Location: [facility];  Service: Cardiopulmonary;  Laterality: N/A;  . ENDOBRONCHIAL ULTRASOUND N/A 08/25/2015   Procedure: ENDOBRONCHIAL ULTRASOUND;  Surgeon: Flora Lipps, MD;  Location: [facility];  Service: Cardiopulmonary;  Laterality: N/A;  . ESOPHAGOGASTRODUODENOSCOPY  2002, 11/15/07   Normal (Dr. Allyn Kenner)  . ESOPHAGOGASTRODUODENOSCOPY (EGD) WITH PROPOFOL N/A 05/18/2019   Procedure: ESOPHAGOGASTRODUODENOSCOPY (EGD) WITH PROPOFOL;  Surgeon: Virgel Manifold, MD;  Location: [facility];  Service: Endoscopy;  Laterality: N/A;  . ESOPHAGOGASTRODUODENOSCOPY (EGD) WITH PROPOFOL N/A 08/15/2019   Procedure: ESOPHAGOGASTRODUODENOSCOPY (EGD) WITH PROPOFOL;  Surgeon: Virgel Manifold, MD;  Location: [facility];  Service: Endoscopy;  Laterality: N/A;  . FACIAL COSMETIC SURGERY  01/13/10  mini facelift  . HERNIA REPAIR  1976  . OOPHORECTOMY    . THORACOTOMY/LOBECTOMY Right 10/27/2015   Procedure: THORACOTOMY/LOBECTOMY;  Surgeon: Nestor Lewandowsky, MD;  Location: [facility];  Service: Thoracic;  Laterality: Right;  . THYROIDECTOMY N/A 12/29/2015   Procedure: THYROIDECTOMY;  Surgeon: Beverly Gust, MD;  Location: [facility];   Service: ENT;  Laterality: N/A;  . TOTAL VAGINAL HYSTERECTOMY  12/30/08   for fibroid pain (Dr. Gertie Fey)    Current Medications: Current Meds  Medication Sig  . Ascorbic Acid (VITAMIN C) 500 MG tablet Take 2,000 mg by mouth daily. Reported on 02/04/2016  . aspirin EC 81 MG tablet Take 1 tablet (81 mg total) by mouth daily.  . Biotin (PA BIOTIN) 1000 MCG tablet Take 1,000 mcg by mouth daily. Reported on 02/04/2016  . CALCIUM-MAG-VIT C-VIT D PO Take by mouth daily.  . Cholecalciferol (VITAMIN D-3) 1000 units CAPS Take 1 capsule by mouth daily with supper.   . Coenzyme Q10 200 MG capsule Take 200 mg by mouth daily.  . Folic Acid-Vit I7-POE U23 (FA-VITAMIN B-6-VITAMIN B-12 PO) Take 1 tablet by mouth daily after breakfast. Reported on 02/04/2016  . levothyroxine (SYNTHROID) 88 MCG tablet TAKE 1 TABLET (88 MCG TOTAL) BY MOUTH DAILY BEFORE BREAKFAST.  Marland Kitchen Lutein 20 MG CAPS Take 20 mg by mouth daily.  . Omega-3 Fatty Acids (SUPER OMEGA 3 EPA/DHA) 1000 MG CAPS Take 1,000 mg by mouth daily with supper.  Marland Kitchen omeprazole (PRILOSEC) 40 MG capsule Take 1 capsule (40 mg total) by mouth daily.  . Potassium Gluconate 595 MG CAPS Take 595 mg by mouth daily.   . pravastatin (PRAVACHOL) 40 MG tablet Take 1 tablet (40 mg total) by mouth daily.     Allergies:   Atorvastatin, Ciprofloxacin, Epinephrine, Metoprolol succinate, Simvastatin, Sulfa antibiotics, and Sulfonamide derivatives   Social History   Socioeconomic History  . Marital status: Married    Spouse name: Not on file  . Number of children: Not on file  . Years of education: Not on file  . Highest education level: Not on file  Occupational History  . 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 Smoking status: Never Smoker  . Smokeless tobacco: Never Used  Substance and Sexual Activity  . Alcohol use: No    Alcohol/week: 0.0 standard drinks  . Drug use: No  . Sexual activity: Never  Other Topics Concern  . Not on file  Social History  Narrative   Married 1963 and lives with husband (he had a CVA 11/12, to SNF and then home as of 12/2011)   Retired from medical office 2013.   1 son.     Social Determinants of Health   Financial Resource Strain:   . Difficulty of Paying Living Expenses: Not on file  Food Insecurity:   . Worried About Charity fundraiser in the Last Year: Not on file  . Ran Out of Food in the Last Year: Not on file  Transportation Needs:   . Lack of Transportation (Medical): Not on file  . Lack of Transportation (Non-Medical): Not on file  Physical Activity:   . Days of Exercise per Week: Not on file  . Minutes of Exercise per Session: Not on file  Stress:   . Feeling of Stress : Not on file  Social Connections:   . Frequency of Communication with Friends and Family: Not on file  . Frequency of Social Gatherings with Friends and Family: Not on file  . Attends  Religious Services: Not on file  . Active Member of Clubs or Organizations: Not on file  . Attends Archivist Meetings: Not on file  . Marital Status: Not on file     Family History: The patient's family history includes Cancer in her sister; Hypertension in her mother, sister, sister, sister, and sister; Stroke in her mother; Transient ischemic attack in her mother. There is no history of Breast cancer or Colon cancer.  ROS:   Please see the history of present illness.     All other systems reviewed and are negative.  EKGs/Labs/Other Studies Reviewed:    The following studies were reviewed today:   EKG:  EKG is  ordered today.  The ekg ordered today demonstrates normal sinus rhythm normal ECG  Recent Labs: 05/17/2019: TSH 1.261 06/24/2019: ALT 14; BUN 16; Creatinine, Ser 0.72; Hemoglobin 13.9; Platelets 122.0 Repeated and verified X2.; Potassium 4.7; Sodium 140  Recent Lipid Panel    Component Value Date/Time   CHOL 171 06/24/2019 0744   CHOL 145 07/01/2013 0103   TRIG 128.0 06/24/2019 0744   TRIG 224 (H) 07/01/2013  0103   HDL 47.30 06/24/2019 0744   HDL 29 (L) 07/01/2013 0103   CHOLHDL 4 06/24/2019 0744   VLDL 25.6 06/24/2019 0744   VLDL 45 (H) 07/01/2013 0103   LDLCALC 98 06/24/2019 0744   LDLCALC 71 07/01/2013 0103   LDLDIRECT 83.8 03/11/2011 0823    Physical Exam:    VS:  BP (!) 148/80 (BP Location: Right Arm, Patient Position: Sitting, Cuff Size: Normal)   Pulse 78   Ht 5\' 4"  (1.626 m)   Wt 123 lb 4 oz (55.9 kg)   SpO2 99%   BMI 21.16 kg/m     Wt Readings from Last 3 Encounters:  12/09/19 123 lb 4 oz (55.9 kg)  11/29/19 121 lb 4 oz (55 kg)  11/19/19 123 lb (55.8 kg)     GEN:  Well nourished, well developed in no acute distress HEENT: Normal NECK: No JVD; No carotid bruits LYMPHATICS: No lymphadenopathy CARDIAC: RRR, no murmurs, rubs, gallops RESPIRATORY:  Clear to auscultation without rales, wheezing or rhonchi  ABDOMEN: Soft, non-tender, non-distended MUSCULOSKELETAL:  No edema; No deformity  SKIN: Warm and dry NEUROLOGIC:  Alert and oriented x 3 PSYCHIATRIC:  Normal affect   ASSESSMENT:    1. Chest pain, unspecified type   2. Coronary artery calcification   3. Pure hypercholesterolemia   4. Precordial pain    PLAN:    In order of problems listed above:  1. Patient with atypical left-sided and central chest pain.  Symptoms not related with exertion.  Coronary artery calcifications noted on recent chest CT. patient has a documented allergy with metoprolol involving swelling.  Also has contrast allergy in the past.  As such we will avoid coronary CTA and evaluate severity of CAD with Lexiscan myocardial perfusion imaging.  We will also obtain echocardiogram. 2. Coronary artery calcifications noted on recent chest CT.  Start aspirin 81 mg daily, continue Pravachol 40 mg daily. 3. History of hyperlipidemia not tolerant to simvastatin or Lipitor.  Her 10-year ASCVD risk score is 16.2.  Last LDL 98.  Continue Pravachol 40 mg daily.  follow up after echocardiogram and  Lexiscan myocardial perfusion imaging stress test.  This note was generated in part or whole with voice recognition software. Voice recognition is usually quite accurate but there are transcription errors that can and very often do occur. I apologize for any typographical errors  that were not detected and corrected.  Medication Adjustments/Labs and Tests Ordered: Current medicines are reviewed at length with the patient today.  Concerns regarding medicines are outlined above.  Orders Placed This Encounter  Procedures  . NM Myocar Multi W/Spect W/Wall Motion / EF  . EKG 12-Lead  . ECHOCARDIOGRAM COMPLETE   No orders of the defined types were placed in this encounter.   Patient Instructions  Medication Instructions:  Your physician recommends that you continue on your current medications as directed. Please refer to the Current Medication list given to you today.  *If you need a refill on your cardiac medications before your next appointment, please call your pharmacy*  Lab Work: None ordered If you have labs (blood work) drawn today and your tests are completely normal, you will receive your results only by: Marland Kitchen MyChart Message (if you have MyChart) OR . A paper copy in the mail If you have any lab test that is abnormal or we need to change your treatment, we will call you to review the results.  Testing/Procedures: Your physician has requested that you have a lexiscan myoview. For further information please visit [email or web address]. Please follow instruction sheet, as given.  Your physician has requested that you have an echocardiogram. Echocardiography is a painless test that uses sound waves to create images of your heart. It provides your doctor with information about the size and shape of your heart and how well your heart's chambers and valves are working. This procedure takes approximately one hour. There are no restrictions for this procedure.    Follow-Up: At [facility], you and your health needs are our priority.  As part of our continuing mission to provide you with exceptional heart care, we have created designated Provider Care Teams.  These Care Teams include your primary Cardiologist (physician) and Advanced Practice Providers (APPs -  Physician Assistants and Nurse Practitioners) who all work together to provide you with the care you need, when you need it.  Your next appointment:   After testing    The format for your next appointment:   In Person  Provider:    You may see Kate Sable, MD or one of the following Advanced Practice Providers on your designated Care Team:    Murray Hodgkins, NP  Christell Faith, PA-C  Marrianne Mood, PA-C   Other Instructions  [place]  Your caregiver has ordered a Stress Test with nuclear imaging. The purpose of this test is to evaluate the blood supply to your heart muscle. This procedure is referred to as a "Non-Invasive Stress Test." This is because other than having an IV started in your vein, nothing is inserted or "invades" your body. Cardiac stress tests are done to find areas of poor blood flow to the heart by determining the extent of coronary artery disease (CAD). Some patients exercise on a treadmill, which naturally increases the blood flow to your heart, while others who are  unable to walk on a treadmill due to physical limitations have a pharmacologic/chemical stress agent called Lexiscan . This medicine will mimic walking on a treadmill by temporarily increasing your coronary blood flow.   Please note: these test may take anywhere between 2-4 hours to complete  PLEASE REPORT TO [place] AT THE FIRST DESK WILL DIRECT YOU WHERE TO GO  Date of Procedure:_____________________________________   Arrival Time for Procedure:______________________________      PLEASE NOTIFY THE OFFICE AT LEAST 24 HOURS IN ADVANCE IF  YOU ARE UNABLE TO KEEP YOUR  APPOINTMENT.  [phone] AND  PLEASE NOTIFY NUCLEAR MEDICINE AT [facility] AT LEAST 24 HOURS IN ADVANCE IF YOU ARE UNABLE TO KEEP YOUR APPOINTMENT. [phone]  How to prepare for your Myoview test:  1. Do not eat or drink after midnight 2. No caffeine for 24 hours prior to test 3. No smoking 24 hours prior to test. 4. Your medication may be taken with water.  If your doctor stopped a medication because of this test, do not take that medication. 5. Ladies, please do not wear dresses.  Skirts or pants are appropriate. Please wear a short sleeve shirt. 6. No perfume, cologne or lotion. 7. Wear comfortable walking shoes. No heels!               Signed, Kate Sable, MD  12/09/2019 12:48 PM    [facility]

## 2019-12-09 NOTE — Patient Instructions (Signed)
Medication Instructions:  Your physician recommends that you continue on your current medications as directed. Please refer to the Current Medication list given to you today.  *If you need a refill on your cardiac medications before your next appointment, please call your pharmacy*  Lab Work: None ordered If you have labs (blood work) drawn today and your tests are completely normal, you will receive your results only by: Marland Kitchen MyChart Message (if you have MyChart) OR . A paper copy in the mail If you have any lab test that is abnormal or we need to change your treatment, we will call you to review the results.  Testing/Procedures: Your physician has requested that you have a lexiscan myoview. For further information please visit [email or web address]. Please follow instruction sheet, as given.  Your physician has requested that you have an echocardiogram. Echocardiography is a painless test that uses sound waves to create images of your heart. It provides your doctor with information about the size and shape of your heart and how well your heart's chambers and valves are working. This procedure takes approximately one hour. There are no restrictions for this procedure.    Follow-Up: At [facility], you and your health needs are our priority.  As part of our continuing mission to provide you with exceptional heart care, we have created designated Provider Care Teams.  These Care Teams include your primary Cardiologist (physician) and Advanced Practice Providers (APPs -  Physician Assistants and Nurse Practitioners) who all work together to provide you with the care you need, when you need it.  Your next appointment:   After testing    The format for your next appointment:   In Person  Provider:    You may see Kate Sable, MD or one of the following Advanced Practice Providers on your designated Care Team:    Murray Hodgkins, NP  Christell Faith, PA-C  Marrianne Mood,  PA-C   Other Instructions  [place]  Your caregiver has ordered a Stress Test with nuclear imaging. The purpose of this test is to evaluate the blood supply to your heart muscle. This procedure is referred to as a "Non-Invasive Stress Test." This is because other than having an IV started in your vein, nothing is inserted or "invades" your body. Cardiac stress tests are done to find areas of poor blood flow to the heart by determining the extent of coronary artery disease (CAD). Some patients exercise on a treadmill, which naturally increases the blood flow to your heart, while others who are  unable to walk on a treadmill due to physical limitations have a pharmacologic/chemical stress agent called Lexiscan . This medicine will mimic walking on a treadmill by temporarily increasing your coronary blood flow.   Please note: these test may take anywhere between 2-4 hours to complete  PLEASE REPORT TO [place] AT THE FIRST DESK WILL DIRECT YOU WHERE TO GO  Date of Procedure:_____________________________________   Arrival Time for Procedure:______________________________      PLEASE NOTIFY THE OFFICE AT LEAST 24 HOURS IN ADVANCE IF YOU ARE UNABLE TO KEEP YOUR APPOINTMENT.  [phone] AND  PLEASE NOTIFY NUCLEAR MEDICINE AT [facility] AT LEAST 24 HOURS IN ADVANCE IF YOU ARE UNABLE TO KEEP YOUR APPOINTMENT. [phone]  How to prepare for your Myoview test:  1. Do not eat or drink after midnight 2. No caffeine for 24 hours prior to test 3. No smoking 24 hours prior to test. 4. Your medication may be taken  with water.  If your doctor stopped a medication because of this test, do not take that medication. 5. Ladies, please do not wear dresses.  Skirts or pants are appropriate. Please wear a short sleeve shirt. 6. No perfume, cologne or lotion. 7. Wear comfortable walking shoes. No heels!

## 2019-12-16 ENCOUNTER — Ambulatory Visit
Admission: RE | Admit: 2019-12-16 | Discharge: 2019-12-16 | Disposition: A | Discharge status: Home / Self Care | Payer: PPO | Source: Ambulatory Visit | Attending: Cardiology | Admitting: Cardiology

## 2019-12-16 ENCOUNTER — Other Ambulatory Visit: Payer: Self-pay

## 2019-12-16 DIAGNOSIS — R079 Chest pain, unspecified: Secondary | ICD-10-CM | POA: Diagnosis not present

## 2019-12-16 LAB — NM MYOCAR MULTI W/SPECT W/WALL MOTION / EF
LV dias vol: 31 mL (ref 46–106)
LV sys vol: 9 mL
Peak HR: 104 {beats}/min
Percent HR: 98 %
Rest HR: 80 {beats}/min
SDS: 0
SRS: 11
SSS: 1
TID: 0.82

## 2019-12-16 MED ORDER — REGADENOSON 0.4 MG/5ML IV SOLN
0.4000 mg | Freq: Once | INTRAVENOUS | Status: AC
  Administered 2019-12-16: 09:00:00 0.4 mg via INTRAVENOUS

## 2019-12-16 MED ORDER — TECHNETIUM TC 99M TETROFOSMIN IV KIT
30.0000 | PACK | Freq: Once | INTRAVENOUS | Status: AC | PRN
  Administered 2019-12-16: 30.881 via INTRAVENOUS

## 2019-12-16 MED ORDER — TECHNETIUM TC 99M TETROFOSMIN IV KIT
10.0000 | PACK | Freq: Once | INTRAVENOUS | Status: AC | PRN
  Administered 2019-12-16: 10.75 via INTRAVENOUS

## 2020-01-08 ENCOUNTER — Other Ambulatory Visit: Payer: Self-pay

## 2020-01-08 ENCOUNTER — Ambulatory Visit (INDEPENDENT_AMBULATORY_CARE_PROVIDER_SITE_OTHER): Payer: PPO

## 2020-01-08 DIAGNOSIS — R072 Precordial pain: Secondary | ICD-10-CM

## 2020-01-14 ENCOUNTER — Encounter: Payer: Self-pay | Admitting: Cardiology

## 2020-01-14 ENCOUNTER — Other Ambulatory Visit: Payer: Self-pay

## 2020-01-14 ENCOUNTER — Ambulatory Visit (INDEPENDENT_AMBULATORY_CARE_PROVIDER_SITE_OTHER): Payer: PPO | Admitting: Cardiology

## 2020-01-14 VITALS — BP 122/68 | HR 88 | Ht 64.0 in | Wt 122.0 lb

## 2020-01-14 DIAGNOSIS — I2510 Atherosclerotic heart disease of native coronary artery without angina pectoris: Secondary | ICD-10-CM

## 2020-01-14 DIAGNOSIS — E7800 Pure hypercholesterolemia, unspecified: Secondary | ICD-10-CM | POA: Diagnosis not present

## 2020-01-14 DIAGNOSIS — I2584 Coronary atherosclerosis due to calcified coronary lesion: Secondary | ICD-10-CM

## 2020-01-14 DIAGNOSIS — R079 Chest pain, unspecified: Secondary | ICD-10-CM

## 2020-01-14 NOTE — Patient Instructions (Signed)
Medication Instructions:  Your physician recommends that you continue on your current medications as directed. Please refer to the Current Medication list given to you today.  *If you need a refill on your cardiac medications before your next appointment, please call your pharmacy*  Follow-Up: At [facility] you and your health needs are our priority.  As part of our continuing mission to provide you with exceptional heart care, we have created designated Provider Care Teams.  These Care Teams include your primary Cardiologist (physician) and Advanced Practice Providers (APPs -  Physician Assistants and Nurse Practitioners) who all work together to provide you with the care you need, when you need it.  We recommend signing up for the patient portal called "MyChart".  Sign up information is provided on this After Visit Summary.  MyChart is used to connect with patients for Virtual Visits (Telemedicine).  Patients are able to view lab/test results, encounter notes, upcoming appointments, etc.  Non-urgent messages can be sent to your provider as well.   To learn more about what you can do with MyChart, go to [email or web address].    Your next appointment:   12 month(s)  The format for your next appointment:   In Person  Provider:   Kate Sable, MD

## 2020-01-14 NOTE — Progress Notes (Signed)
Cardiology Office Note:    Date:  01/14/2020   ID:  [name], DOB 21-Sep-1946, MRN [id number]  PCP:  Tonia Ghent, MD  Cardiologist:  Kate Sable, MD  Electrophysiologist:  None   Referring MD: Tonia Ghent, MD   Chief Complaint  Patient presents with  . Other    follow up post ECHO and LEXI. Meds reviewed verbally with patient.     History of Present Illness:    [name] is a 74 y.o. female with a hx of hyperlipidemia, pulmonary nodule, who presents for follow-up.  She was last seen due to coronary artery calcifications.  Patient with history of pulmonary nodule being monitored with chest CT.  Recent chest CT showed aortic atherosclerosis and also calcifications in the LAD.  She states having occasional left-sided chest pain which she has attributed to breast pain after mammograms.  Also have central bandlike chest pressure not related with exertion every now and then.  She denies palpitations, edema, presyncope or syncope.  She has a history of myalgias with statins(simvastatin, Lipitor) in the past.  Able to tolerate Pravachol.  She has history of contrast allergy and also allergies to metoprolol.  A Lexiscan myocardial perfusion imaging stress test was ordered to evaluate ischemia.  Echo was ordered.  He presents for results.  Past Medical History:  Diagnosis Date  . 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 GERD (gastroesophageal reflux disease)   . GIB (gastrointestinal bleeding)   . Groin pain    Along superior/laterl R inguinal canal.  Could be due to hernia or old scar tissue.  prev with CT done w/o alarming findings.  Will treat episodically.  Use tylenol #3 prn.    . Hyperlipidemia   . Lipoma of thigh    Left  . Normal cardiac stress test 2014  . PONV (postoperative nausea and vomiting)    nausea after hysterectomy, and 2 days after lung surgery 10/2015  . Thyroid cancer ([place]) 12/2015  . Thyroid nodule     Past Surgical History:   Procedure Laterality Date  . ABDOMINAL HYSTERECTOMY    . BREAST EXCISIONAL BIOPSY Right 1977  . BREAST MASS EXCISION  1977   benign  . CARPAL TUNNEL RELEASE Right 1978  . CATARACT EXTRACTION, BILATERAL Bilateral   . CHOLECYSTECTOMY  11/23/2016   Procedure: LAPAROSCOPIC CHOLECYSTECTOMY;  Surgeon: Clayburn Pert, MD;  Location: [facility];  Service: General;;  . COLONOSCOPY  2009  . COLONOSCOPY WITH PROPOFOL N/A 05/18/2019   Procedure: COLONOSCOPY WITH PROPOFOL;  Surgeon: Virgel Manifold, MD;  Location: [facility];  Service: Endoscopy;  Laterality: N/A;  . COLONOSCOPY WITH PROPOFOL N/A 08/15/2019   Procedure: COLONOSCOPY WITH PROPOFOL;  Surgeon: Virgel Manifold, MD;  Location: [facility];  Service: Endoscopy;  Laterality: N/A;  . DILATION AND CURETTAGE OF UTERUS  2001  . ELECTROMAGNETIC NAVIGATION BROCHOSCOPY N/A 08/25/2015   Procedure: ELECTROMAGNETIC NAVIGATION BRONCHOSCOPY;  Surgeon: Flora Lipps, MD;  Location: [facility];  Service: Cardiopulmonary;  Laterality: N/A;  . ENDOBRONCHIAL ULTRASOUND N/A 08/25/2015   Procedure: ENDOBRONCHIAL ULTRASOUND;  Surgeon: Flora Lipps, MD;  Location: [facility];  Service: Cardiopulmonary;  Laterality: N/A;  . ESOPHAGOGASTRODUODENOSCOPY  2002, 11/15/07   Normal (Dr. Allyn Kenner)  . ESOPHAGOGASTRODUODENOSCOPY (EGD) WITH PROPOFOL N/A 05/18/2019   Procedure: ESOPHAGOGASTRODUODENOSCOPY (EGD) WITH PROPOFOL;  Surgeon: Virgel Manifold, MD;  Location: [facility];  Service: Endoscopy;  Laterality: N/A;  . ESOPHAGOGASTRODUODENOSCOPY (EGD) WITH PROPOFOL N/A 08/15/2019   Procedure: ESOPHAGOGASTRODUODENOSCOPY (EGD) WITH PROPOFOL;  Surgeon:  Virgel Manifold, MD;  Location: [facility];  Service: Endoscopy;  Laterality: N/A;  . FACIAL COSMETIC SURGERY  01/13/10   mini facelift  . HERNIA REPAIR  1976  . OOPHORECTOMY    . THORACOTOMY/LOBECTOMY Right 10/27/2015   Procedure: THORACOTOMY/LOBECTOMY;  Surgeon: Nestor Lewandowsky, MD;  Location: [facility];   Service: Thoracic;  Laterality: Right;  . THYROIDECTOMY N/A 12/29/2015   Procedure: THYROIDECTOMY;  Surgeon: Beverly Gust, MD;  Location: [facility];  Service: ENT;  Laterality: N/A;  . TOTAL VAGINAL HYSTERECTOMY  12/30/08   for fibroid pain (Dr. Gertie Fey)    Current Medications: Current Meds  Medication Sig  . Ascorbic Acid (VITAMIN C) 500 MG tablet Take 2,000 mg by mouth daily. Reported on 02/04/2016  . aspirin EC 81 MG tablet Take 1 tablet (81 mg total) by mouth daily.  . Biotin (PA BIOTIN) 1000 MCG tablet Take 1,000 mcg by mouth daily. Reported on 02/04/2016  . CALCIUM-MAG-VIT C-VIT D PO Take by mouth daily.  . Cholecalciferol (VITAMIN D-3) 1000 units CAPS Take 1 capsule by mouth daily with supper.   . Coenzyme Q10 200 MG capsule Take 200 mg by mouth daily.  . Folic Acid-Vit P2-RJJ O84 (FA-VITAMIN B-6-VITAMIN B-12 PO) Take 1 tablet by mouth daily after breakfast. Reported on 02/04/2016  . levothyroxine (SYNTHROID) 88 MCG tablet TAKE 1 TABLET (88 MCG TOTAL) BY MOUTH DAILY BEFORE BREAKFAST.  Marland Kitchen Lutein 20 MG CAPS Take 20 mg by mouth daily.  . Omega-3 Fatty Acids (SUPER OMEGA 3 EPA/DHA) 1000 MG CAPS Take 1,000 mg by mouth daily with supper.  Marland Kitchen omeprazole (PRILOSEC) 40 MG capsule Take 1 capsule (40 mg total) by mouth daily.  . Potassium Gluconate 595 MG CAPS Take 595 mg by mouth daily.   . pravastatin (PRAVACHOL) 40 MG tablet Take 1 tablet (40 mg total) by mouth daily.     Allergies:   Atorvastatin, Ciprofloxacin, Epinephrine, Metoprolol succinate, Simvastatin, Sulfa antibiotics, and Sulfonamide derivatives   Social History   Socioeconomic History  . Marital status: Married    Spouse name: Not on file  . Number of children: Not on file  . Years of education: Not on file  . Highest education level: Not on file  Occupational History  . 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 Smoking status: Never Smoker  . Smokeless tobacco: Never Used  Substance and Sexual Activity  . Alcohol  use: No    Alcohol/week: 0.0 standard drinks  . Drug use: No  . Sexual activity: Never  Other Topics Concern  . Not on file  Social History Narrative   Married 1963 and lives with husband (he had a CVA 11/12, to SNF and then home as of 12/2011)   Retired from medical office 2013.   1 son.     Social Determinants of Health   Financial Resource Strain:   . Difficulty of Paying Living Expenses:   Food Insecurity:   . Worried About Charity fundraiser in the Last Year:   . Arboriculturist in the Last Year:   Transportation Needs:   . Film/video editor (Medical):   Marland Kitchen Lack of Transportation (Non-Medical):   Physical Activity:   . Days of Exercise per Week:   . Minutes of Exercise per Session:   Stress:   . Feeling of Stress :   Social Connections:   . Frequency of Communication with Friends and Family:   . Frequency of Social Gatherings with Friends and Family:   .  Attends Religious Services:   . Active Member of Clubs or Organizations:   . Attends Archivist Meetings:   Marland Kitchen Marital Status:      Family History: The patient's family history includes Cancer in her sister; Hypertension in her mother, sister, sister, sister, and sister; Stroke in her mother; Transient ischemic attack in her mother. There is no history of Breast cancer or Colon cancer.  ROS:   Please see the history of present illness.     All other systems reviewed and are negative.  EKGs/Labs/Other Studies Reviewed:    The following studies were reviewed today:   EKG:  EKG is  ordered today.  The ekg ordered today demonstrates normal sinus rhythm, possible left atrial enlargement.  Recent Labs: 05/17/2019: TSH 1.261 06/24/2019: ALT 14; BUN 16; Creatinine, Ser 0.72; Hemoglobin 13.9; Platelets 122.0 Repeated and verified X2.; Potassium 4.7; Sodium 140  Recent Lipid Panel    Component Value Date/Time   CHOL 171 06/24/2019 0744   CHOL 145 07/01/2013 0103   TRIG 128.0 06/24/2019 0744   TRIG 224  (H) 07/01/2013 0103   HDL 47.30 06/24/2019 0744   HDL 29 (L) 07/01/2013 0103   CHOLHDL 4 06/24/2019 0744   VLDL 25.6 06/24/2019 0744   VLDL 45 (H) 07/01/2013 0103   LDLCALC 98 06/24/2019 0744   LDLCALC 71 07/01/2013 0103   LDLDIRECT 83.8 03/11/2011 0823    Physical Exam:    VS:  BP 122/68 (BP Location: Left Arm, Patient Position: Sitting, Cuff Size: Normal)   Pulse 88   Ht 5\' 4"  (1.626 m)   Wt 122 lb (55.3 kg)   SpO2 98%   BMI 20.94 kg/m     Wt Readings from Last 3 Encounters:  01/14/20 122 lb (55.3 kg)  12/09/19 123 lb 4 oz (55.9 kg)  11/29/19 121 lb 4 oz (55 kg)     GEN:  Well nourished, well developed in no acute distress HEENT: Normal NECK: No JVD; No carotid bruits LYMPHATICS: No lymphadenopathy CARDIAC: RRR, no murmurs, rubs, gallops RESPIRATORY:  Clear to auscultation without rales, wheezing or rhonchi  ABDOMEN: Soft, non-tender, non-distended MUSCULOSKELETAL:  No edema; No deformity  SKIN: Warm and dry NEUROLOGIC:  Alert and oriented x 3 PSYCHIATRIC:  Normal affect   ASSESSMENT:    1. Chest pain, unspecified type   2. Coronary artery calcification   3. Pure hypercholesterolemia    PLAN:    In order of problems listed above:  1. Patient with atypical left-sided and central chest pain.  Echocardiogram showed normal systolic function with EF 60 to 95%, normal diastolic function.  Lexiscan myocardial perfusion imaging stress test was normal with no evidence for ischemia.  Patient reassured. 2. Coronary artery calcifications noted on recent chest CT. continue aspirin 81 mg daily, Pravachol 40 mg daily. 3. History of hyperlipidemia not tolerant to simvastatin or Lipitor.  Continue Pravachol 40 mg daily.  Follow-up in a year.  This note was generated in part or whole with voice recognition software. Voice recognition is usually quite accurate but there are transcription errors that can and very often do occur. I apologize for any typographical errors that were  not detected and corrected.  Medication Adjustments/Labs and Tests Ordered: Current medicines are reviewed at length with the patient today.  Concerns regarding medicines are outlined above.  Orders Placed This Encounter  Procedures  . EKG 12-Lead   No orders of the defined types were placed in this encounter.   Patient Instructions  Medication  Instructions:  Your physician recommends that you continue on your current medications as directed. Please refer to the Current Medication list given to you today.  *If you need a refill on your cardiac medications before your next appointment, please call your pharmacy*  Follow-Up: At [facility], you and your health needs are our priority.  As part of our continuing mission to provide you with exceptional heart care, we have created designated Provider Care Teams.  These Care Teams include your primary Cardiologist (physician) and Advanced Practice Providers (APPs -  Physician Assistants and Nurse Practitioners) who all work together to provide you with the care you need, when you need it.  We recommend signing up for the patient portal called "MyChart".  Sign up information is provided on this After Visit Summary.  MyChart is used to connect with patients for Virtual Visits (Telemedicine).  Patients are able to view lab/test results, encounter notes, upcoming appointments, etc.  Non-urgent messages can be sent to your provider as well.   To learn more about what you can do with MyChart, go to [email or web address].    Your next appointment:   12 month(s)  The format for your next appointment:   In Person  Provider:   Kate Sable, MD      Signed, Kate Sable, MD  01/14/2020 9:48 AM    [place]

## 2020-02-20 ENCOUNTER — Other Ambulatory Visit: Payer: Self-pay

## 2020-02-20 ENCOUNTER — Encounter: Payer: Self-pay | Admitting: Family Medicine

## 2020-02-20 ENCOUNTER — Ambulatory Visit (INDEPENDENT_AMBULATORY_CARE_PROVIDER_SITE_OTHER): Payer: PPO | Admitting: Family Medicine

## 2020-02-20 ENCOUNTER — Telehealth: Payer: Self-pay

## 2020-02-20 VITALS — BP 136/80 | HR 87 | Temp 96.9°F | Ht 64.0 in | Wt 125.2 lb

## 2020-02-20 DIAGNOSIS — F430 Acute stress reaction: Secondary | ICD-10-CM | POA: Diagnosis not present

## 2020-02-20 DIAGNOSIS — L309 Dermatitis, unspecified: Secondary | ICD-10-CM

## 2020-02-20 MED ORDER — TRIAMCINOLONE ACETONIDE 0.1 % EX CREA: 1.0000 "application " | TOPICAL_CREAM | Freq: Two times a day (BID) | CUTANEOUS | 1 refills | Status: DC

## 2020-02-20 NOTE — Assessment & Plan Note (Signed)
Areas of rash (back-bilat, R chest and upper abd) resemble mild dermatis (erythema/not raised but slt scale)  No evidence of urticaria Uses purex det and dove soap No new products, medications  Asked her to stop using fabric softener and avoid the sun  Triamcinolone cream px for prn use  Adv to keep cool /avoid hot water and harsh detergents  Update if not starting to improve in a week or if worsening   Going through much stress currently  -? If this could play a role

## 2020-02-20 NOTE — Patient Instructions (Addendum)
Stop the bounce fabric softener  Keep cool  Use cool compress if needed   Watch the rash - for blisters /increased pain or evidence of a wound , area spreads - etc  Also if you develop a fever or other symptoms   Use the triamcinolone cream as needed until it is better   Take care of yourself   Take advantage of counseling from hospice if available

## 2020-02-20 NOTE — Telephone Encounter (Signed)
[place] Night - Client Nonclinical Telephone Record [facility] [place] Night - Client Client Site [facility] [place] Physician Renford Dills - MD Contact Type Call Who Is Calling Patient / Member / Family / Caregiver Caller Name Java Phone Number [phone] Patient Name [name] Patient DOB 1946-07-11 Call Type Message Only Information Provided Reason for Call Request to Schedule Office Appointment Initial Comment Caller states she needs to schedule an appointment for today. Additional Comment Provided the caller with the office hours. Caller declined triage. Disp. Time Disposition Final User 02/20/2020 7:16:04 AM General Information Provided Yes Luan Pulling, Dawn Call Closed By: Epifania Gore Transaction Date/Time: 02/20/2020 7:13:43 AM (ET)

## 2020-02-20 NOTE — Assessment & Plan Note (Signed)
Pt's son is in home hospice for end stage alcohol related disease and they are caring for him Overall going well/she remains strong Urged her to reach out to hospice re: counseling

## 2020-02-20 NOTE — Progress Notes (Signed)
Subjective:    Patient ID: [name], female    DOB: [birth year], 74 y.o.   MRN: [id number]  This visit occurred during the SARS-CoV-2 public health emergency.  Safety protocols were in place, including screening questions prior to the visit, additional usage of staff PPE, and extensive cleaning of exam room while observing appropriate contact time as indicated for disinfecting solutions.    HPI Pt presents for c/o ? Shingles   Wt Readings from Last 3 Encounters:  02/20/20 125 lb 3 oz (56.8 kg)  01/14/20 122 lb (55.3 kg)  12/09/19 123 lb 4 oz (55.9 kg)   21.49 kg/m   She noticed an itchy spot on her back  Started on one side and then in front Then on the other side  Itches and hurts a bit   Some discomfort between shoulder blades   Very high stress  Son is dealing with alcoholism and liver failure  Is home hospice  -she is caring for him   She had zostavax in 7/11 Has not had the shingrix yet   purex detergent  Dove soap  Unscented bounce   Patient Active Problem List   Diagnosis Date Noted  . Dermatitis 02/20/2020  . Adenomatous polyp of ascending colon   . Polyp of transverse colon   . Gastric polyp   . Stomach irritation   . Healthcare maintenance 06/30/2019  . GI bleed 05/17/2019  . Upper back pain 04/14/2018  . Aortic atherosclerosis ([place]) 05/31/2017  . Breast pain 03/25/2017  . Cancer of lower lobe of right lung ([place]) 05/06/2016  . Thyroid cancer ([place]) 09/11/2015  . Adenopathy   . Advance care planning 05/14/2014  . Thrombocytopenia ([place]) 07/03/2013  . Osteopenia 05/05/2013  . Medicare annual wellness visit, subsequent 04/26/2012  . GERD (gastroesophageal reflux disease) 04/26/2012  . Abdominal pain 01/12/2012  . Essential hypertension 11/17/2010  . HLD (hyperlipidemia) 01/31/2008  . 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 Adenocarcinoma of right lung ([place]) 10/01/2015  . GERD  (gastroesophageal reflux disease)   . GIB (gastrointestinal bleeding)   . Groin pain    Along superior/laterl R inguinal canal.  Could be due to hernia or old scar tissue.  prev with CT done w/o alarming findings.  Will treat episodically.  Use tylenol #3 prn.    . Hyperlipidemia   . Lipoma of thigh    Left  . Normal cardiac stress test 2014  . PONV (postoperative nausea and vomiting)    nausea after hysterectomy, and 2 days after lung surgery 10/2015  . Thyroid cancer ([place]) 12/2015  . Thyroid nodule    Past Surgical History:  Procedure Laterality Date  . ABDOMINAL HYSTERECTOMY    . BREAST EXCISIONAL BIOPSY Right 1977  . BREAST MASS EXCISION  1977   benign  . CARPAL TUNNEL RELEASE Right 1978  . CATARACT EXTRACTION, BILATERAL Bilateral   . CHOLECYSTECTOMY  11/23/2016   Procedure: LAPAROSCOPIC CHOLECYSTECTOMY;  Surgeon: Clayburn Pert, MD;  Location: [facility];  Service: General;;  . COLONOSCOPY  2009  . COLONOSCOPY WITH PROPOFOL N/A 05/18/2019   Procedure: COLONOSCOPY WITH PROPOFOL;  Surgeon: Virgel Manifold, MD;  Location: [facility];  Service: Endoscopy;  Laterality: N/A;  . COLONOSCOPY WITH PROPOFOL N/A 08/15/2019   Procedure: COLONOSCOPY WITH PROPOFOL;  Surgeon: Virgel Manifold, MD;  Location: [facility];  Service: Endoscopy;  Laterality: N/A;  . DILATION AND CURETTAGE OF UTERUS  2001  . ELECTROMAGNETIC NAVIGATION  BROCHOSCOPY N/A 08/25/2015   Procedure: ELECTROMAGNETIC NAVIGATION BRONCHOSCOPY;  Surgeon: Flora Lipps, MD;  Location: [facility];  Service: Cardiopulmonary;  Laterality: N/A;  . ENDOBRONCHIAL ULTRASOUND N/A 08/25/2015   Procedure: ENDOBRONCHIAL ULTRASOUND;  Surgeon: Flora Lipps, MD;  Location: [facility];  Service: Cardiopulmonary;  Laterality: N/A;  . ESOPHAGOGASTRODUODENOSCOPY  2002, 11/15/07   Normal (Dr. Allyn Kenner)  . ESOPHAGOGASTRODUODENOSCOPY (EGD) WITH PROPOFOL N/A 05/18/2019   Procedure: ESOPHAGOGASTRODUODENOSCOPY (EGD) WITH PROPOFOL;  Surgeon:  Virgel Manifold, MD;  Location: [facility];  Service: Endoscopy;  Laterality: N/A;  . ESOPHAGOGASTRODUODENOSCOPY (EGD) WITH PROPOFOL N/A 08/15/2019   Procedure: ESOPHAGOGASTRODUODENOSCOPY (EGD) WITH PROPOFOL;  Surgeon: Virgel Manifold, MD;  Location: [facility];  Service: Endoscopy;  Laterality: N/A;  . FACIAL COSMETIC SURGERY  01/13/10   mini facelift  . HERNIA REPAIR  1976  . OOPHORECTOMY    . THORACOTOMY/LOBECTOMY Right 10/27/2015   Procedure: THORACOTOMY/LOBECTOMY;  Surgeon: Nestor Lewandowsky, MD;  Location: [facility];  Service: Thoracic;  Laterality: Right;  . THYROIDECTOMY N/A 12/29/2015   Procedure: THYROIDECTOMY;  Surgeon: Beverly Gust, MD;  Location: [facility];  Service: ENT;  Laterality: N/A;  . TOTAL VAGINAL HYSTERECTOMY  12/30/08   for fibroid pain (Dr. Gertie Fey)   Social History   Tobacco Use  . Smoking status: Never Smoker  . Smokeless tobacco: Never Used  Substance Use Topics  . Alcohol use: No    Alcohol/week: 0.0 standard drinks  . Drug use: No   Family History  Problem Relation Age of Onset  . Transient ischemic attack Mother   . Hypertension Mother   . Stroke Mother        mini strokes  . Hypertension Sister   . Cancer Sister        ovarian  . Hypertension Sister   . Hypertension Sister   . Hypertension Sister   . Breast cancer Neg Hx   . Colon cancer Neg Hx    Allergies  Allergen Reactions  . Atorvastatin     REACTION: myalgias  . Ciprofloxacin     REACTION: increase reflux  . Epinephrine     Heart racing with dental injection  . Metoprolol Succinate Swelling    Swelling and stiffness in hands and ankles  . Simvastatin Other (See Comments)    Muscle aches and stiffness  . Sulfa Antibiotics Other (See Comments)    REACTION: itching REACTION: itching  . Sulfonamide Derivatives     REACTION: itching   Current Outpatient Medications on File Prior to Visit  Medication Sig Dispense Refill  . Ascorbic Acid (VITAMIN C) 500 MG tablet Take  2,000 mg by mouth daily. Reported on 02/04/2016    . aspirin EC 81 MG tablet Take 1 tablet (81 mg total) by mouth daily.    . Biotin (PA BIOTIN) 1000 MCG tablet Take 1,000 mcg by mouth daily. Reported on 02/04/2016    . CALCIUM-MAG-VIT C-VIT D PO Take by mouth daily.    . Cholecalciferol (VITAMIN D-3) 1000 units CAPS Take 1 capsule by mouth daily with supper.     . Coenzyme Q10 200 MG capsule Take 200 mg by mouth daily.    . Folic Acid-Vit S2-GBT D17 (FA-VITAMIN B-6-VITAMIN B-12 PO) Take 1 tablet by mouth daily after breakfast. Reported on 02/04/2016    . levothyroxine (SYNTHROID) 88 MCG tablet TAKE 1 TABLET (88 MCG TOTAL) BY MOUTH DAILY BEFORE BREAKFAST. 90 tablet 3  . Lutein 20 MG CAPS Take 20 mg by mouth daily.    . Omega-3 Fatty  Acids (SUPER OMEGA 3 EPA/DHA) 1000 MG CAPS Take 1,000 mg by mouth daily with supper.    Marland Kitchen omeprazole (PRILOSEC) 40 MG capsule Take 1 capsule (40 mg total) by mouth daily. 90 capsule 3  . Potassium Gluconate 595 MG CAPS Take 595 mg by mouth daily.     . pravastatin (PRAVACHOL) 40 MG tablet Take 1 tablet (40 mg total) by mouth daily. 90 tablet 3  . [DISCONTINUED] Calcium Carbonate (CALCIUM 500 PO) Take 1 tablet by mouth every morning.     . [DISCONTINUED] calcium gluconate 500 MG tablet Take 500 mg by mouth daily.      . [DISCONTINUED] metoprolol succinate (TOPROL-XL) 25 MG 24 hr tablet Take 25 mg by mouth at bedtime.      . [DISCONTINUED] simvastatin (ZOCOR) 40 MG tablet Take 40 mg by mouth at bedtime.       No current facility-administered medications on file prior to visit.    Review of Systems  Constitutional: Negative for activity change, appetite change, fatigue, fever and unexpected weight change.  HENT: Negative for congestion, ear pain, rhinorrhea, sinus pressure and sore throat.   Eyes: Negative for pain, redness and visual disturbance.  Respiratory: Negative for cough, shortness of breath and wheezing.   Cardiovascular: Negative for chest pain and  palpitations.  Gastrointestinal: Negative for abdominal pain, blood in stool, constipation and diarrhea.  Endocrine: Negative for polydipsia and polyuria.  Genitourinary: Negative for dysuria, frequency and urgency.  Musculoskeletal: Negative for arthralgias, back pain and myalgias.  Skin: Positive for rash. Negative for pallor and wound.  Allergic/Immunologic: Negative for environmental allergies.  Neurological: Negative for dizziness, syncope and headaches.  Hematological: Negative for adenopathy. Does not bruise/bleed easily.  Psychiatric/Behavioral: Negative for decreased concentration and dysphoric mood. The patient is not nervous/anxious.        Objective:   Physical Exam Constitutional:      General: She is not in acute distress.    Appearance: Normal appearance. She is normal weight. She is not ill-appearing.  HENT:     Head: Normocephalic and atraumatic.  Eyes:     General: No scleral icterus.       Right eye: No discharge.        Left eye: No discharge.     Conjunctiva/sclera: Conjunctivae normal.     Pupils: Pupils are equal, round, and reactive to light.  Cardiovascular:     Rate and Rhythm: Normal rate and regular rhythm.     Heart sounds: Normal heart sounds.  Pulmonary:     Effort: Pulmonary effort is normal. No respiratory distress.     Breath sounds: Normal breath sounds. No stridor. No wheezing, rhonchi or rales.  Chest:     Chest wall: No tenderness.  Abdominal:     General: Abdomen is flat. Bowel sounds are normal. There is no distension.     Palpations: There is no mass.     Tenderness: There is no abdominal tenderness.  Musculoskeletal:     Cervical back: Normal range of motion and neck supple.  Lymphadenopathy:     Cervical: No cervical adenopathy.  Skin:    Coloration: Skin is not jaundiced.     Findings: Erythema and rash present.     Comments: Erythematous rash - pink, confluent and not raised (slight texture)  Under/near  R scapula to lateral  chest and L sided similar area but lower and more lateral  Smaller areas R upper chest and under sternum  No vesicles/papules or pustules  No  excoriations  Neurological:     Mental Status: She is alert.     Cranial Nerves: No cranial nerve deficit.     Sensory: No sensory deficit.  Psychiatric:        Mood and Affect: Mood normal.     Comments: Pleasant  Candidly discusses stressors            Assessment & Plan:   Problem List Items Addressed This Visit      Musculoskeletal and Integument   Dermatitis - Primary    Areas of rash (back-bilat, R chest and upper abd) resemble mild dermatis (erythema/not raised but slt scale)  No evidence of urticaria Uses purex det and dove soap No new products, medications  Asked her to stop using fabric softener and avoid the sun  Triamcinolone cream px for prn use  Adv to keep cool /avoid hot water and harsh detergents  Update if not starting to improve in a week or if worsening   Going through much stress currently  -? If this could play a role        Other   Stress reaction    Pt's son is in home hospice for end stage alcohol related disease and they are caring for him Overall going well/she remains strong Urged her to reach out to hospice re: counseling

## 2020-02-20 NOTE — Telephone Encounter (Signed)
Per appt desk pt already has appt for today at [facility] with Dr Glori Bickers.

## 2020-03-12 DIAGNOSIS — M2041 Other hammer toe(s) (acquired), right foot: Secondary | ICD-10-CM | POA: Diagnosis not present

## 2020-03-12 DIAGNOSIS — M2011 Hallux valgus (acquired), right foot: Secondary | ICD-10-CM | POA: Diagnosis not present

## 2020-04-27 ENCOUNTER — Telehealth: Payer: Self-pay | Admitting: Cardiology

## 2020-04-27 NOTE — Telephone Encounter (Signed)
LMOM to call back. Reached patient on her cell phone. She stated she has felt fine since her bowel movement this morning and has not had any subsequent chest pain. She had a normal Lexiscan and echo in March and has been taking her medications as prescribed. Her follow up appointment is in March of 2022. If she has any repeat chest pain, or any other concerning symptoms, she will inform our office so that we can see her sooner.   Patient was very appreciative for the call back. She verbalized understanding and agreed with plan.

## 2020-04-27 NOTE — Telephone Encounter (Signed)
Pt c/o of Chest Pain: STAT if CP now or developed within 24 hours  1. Are you having CP right now? No - was having breakfast and had a pain in chest that has went away    2. Are you experiencing any other symptoms (ex. SOB, nausea, vomiting, sweating)? Had a hot flash then bowel movement   3. How long have you been experiencing CP? Started today   4. Is your CP continuous or coming and going? Coming and going   5. Have you taken Nitroglycerin? No, Asprin  ?

## 2020-05-07 DIAGNOSIS — C73 Malignant neoplasm of thyroid gland: Secondary | ICD-10-CM | POA: Diagnosis not present

## 2020-05-08 ENCOUNTER — Encounter: Payer: Self-pay | Admitting: Internal Medicine

## 2020-05-08 DIAGNOSIS — M2011 Hallux valgus (acquired), right foot: Secondary | ICD-10-CM | POA: Diagnosis not present

## 2020-05-08 DIAGNOSIS — M79671 Pain in right foot: Secondary | ICD-10-CM | POA: Diagnosis not present

## 2020-05-08 DIAGNOSIS — M2041 Other hammer toe(s) (acquired), right foot: Secondary | ICD-10-CM | POA: Diagnosis not present

## 2020-05-11 ENCOUNTER — Encounter: Payer: Self-pay | Admitting: Internal Medicine

## 2020-05-15 ENCOUNTER — Other Ambulatory Visit: Payer: Self-pay

## 2020-05-15 ENCOUNTER — Encounter: Payer: Self-pay | Admitting: Internal Medicine

## 2020-05-15 ENCOUNTER — Ambulatory Visit
Admission: RE | Admit: 2020-05-15 | Discharge: 2020-05-15 | Disposition: A | Discharge status: Home / Self Care | Payer: PPO | Source: Ambulatory Visit | Attending: Internal Medicine | Admitting: Internal Medicine

## 2020-05-15 DIAGNOSIS — C3491 Malignant neoplasm of unspecified part of right bronchus or lung: Secondary | ICD-10-CM | POA: Diagnosis not present

## 2020-05-15 DIAGNOSIS — J8410 Pulmonary fibrosis, unspecified: Secondary | ICD-10-CM | POA: Diagnosis not present

## 2020-05-15 DIAGNOSIS — I2510 Atherosclerotic heart disease of native coronary artery without angina pectoris: Secondary | ICD-10-CM | POA: Diagnosis not present

## 2020-05-15 DIAGNOSIS — C3431 Malignant neoplasm of lower lobe, right bronchus or lung: Secondary | ICD-10-CM

## 2020-05-15 DIAGNOSIS — I700 Atherosclerosis of aorta: Secondary | ICD-10-CM | POA: Diagnosis not present

## 2020-05-18 ENCOUNTER — Other Ambulatory Visit: Payer: Self-pay

## 2020-05-18 ENCOUNTER — Inpatient Hospital Stay: Payer: PPO | Attending: Internal Medicine | Admitting: Internal Medicine

## 2020-05-18 VITALS — BP 135/60 | HR 85 | Temp 97.6°F | Resp 16 | Ht 64.0 in | Wt 125.6 lb

## 2020-05-18 DIAGNOSIS — Z9079 Acquired absence of other genital organ(s): Secondary | ICD-10-CM | POA: Diagnosis not present

## 2020-05-18 DIAGNOSIS — Z85118 Personal history of other malignant neoplasm of bronchus and lung: Secondary | ICD-10-CM | POA: Diagnosis not present

## 2020-05-18 DIAGNOSIS — C3431 Malignant neoplasm of lower lobe, right bronchus or lung: Secondary | ICD-10-CM

## 2020-05-18 DIAGNOSIS — K219 Gastro-esophageal reflux disease without esophagitis: Secondary | ICD-10-CM | POA: Diagnosis not present

## 2020-05-18 DIAGNOSIS — Z8249 Family history of ischemic heart disease and other diseases of the circulatory system: Secondary | ICD-10-CM | POA: Insufficient documentation

## 2020-05-18 DIAGNOSIS — Z79899 Other long term (current) drug therapy: Secondary | ICD-10-CM | POA: Diagnosis not present

## 2020-05-18 DIAGNOSIS — Z8041 Family history of malignant neoplasm of ovary: Secondary | ICD-10-CM | POA: Diagnosis not present

## 2020-05-18 DIAGNOSIS — Z90722 Acquired absence of ovaries, bilateral: Secondary | ICD-10-CM | POA: Insufficient documentation

## 2020-05-18 DIAGNOSIS — R918 Other nonspecific abnormal finding of lung field: Secondary | ICD-10-CM | POA: Insufficient documentation

## 2020-05-18 DIAGNOSIS — M8580 Other specified disorders of bone density and structure, unspecified site: Secondary | ICD-10-CM | POA: Insufficient documentation

## 2020-05-18 DIAGNOSIS — Z85850 Personal history of malignant neoplasm of thyroid: Secondary | ICD-10-CM | POA: Insufficient documentation

## 2020-05-18 DIAGNOSIS — Z90710 Acquired absence of both cervix and uterus: Secondary | ICD-10-CM | POA: Insufficient documentation

## 2020-05-18 DIAGNOSIS — E785 Hyperlipidemia, unspecified: Secondary | ICD-10-CM | POA: Insufficient documentation

## 2020-05-18 DIAGNOSIS — Z7982 Long term (current) use of aspirin: Secondary | ICD-10-CM | POA: Diagnosis not present

## 2020-05-18 NOTE — Progress Notes (Signed)
[place] OFFICE PROGRESS NOTE  Patient Care Team: Tonia Ghent, MD as PCP - General (Family Medicine) Kate Sable, MD as PCP - Cardiology (Cardiology) Beverly Gust, MD as Consulting Physician (Otolaryngology) Nestor Lewandowsky, MD as Consulting Physician (Cardiothoracic Surgery) Cammie Sickle, MD as Consulting Physician (Internal Medicine) Birder Robson, MD as Consulting Physician (Ophthalmology) Clayburn Pert, MD as Consulting Physician (General Surgery)  Cancer Staging Thyroid cancer [facility]) Staging form: Thyroid - Papillary or Follicular (Under 45 years), AJCC 7th Edition - Clinical: Stage I (T1, N0, M0) - Signed by Forest Gleason, MD on 09/11/2015    Oncology History Overview Note  # NOV 2016- RLL [place] ca T1N0 [incidental; Dr.Simonds; [place]c- s/p RLlobectomy; Dr.Oaks;Dec 2016]  # FEB 2017- 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 STAGE I; s/p Total Thyroidetcomy [Dr.Chapman];NO RAIU.  LOW RISK; but detectable thyroglobulin- on Synthroid  #Survivorship-pending  DIAGNOSIS: Lung cancer -stage I #thyroid cancer low risk  GOALS: Cure  CURRENT/MOST RECENT THERAPY: Surveillance    Thyroid cancer ([place])  Cancer of lower lobe of right lung ([place])  05/06/2016 Initial Diagnosis   Malignant neoplasm of lower lobe, right bronchus or lung (HCC)       INTERVAL HISTORY:  [name] 74 y.o.  female pleasant patient above history of stage I lung cancer and also stage I thyroid cancer currently on surveillance is here for follow-up/review results of CT scan.  Patient denies any new onset of bone pain.  Complains of chronic mid scapular pain.  Not any worse.  No headaches.  Nausea vomiting.  Intermittent hot flashes.  No night sweats.  Review of Systems  Constitutional: Positive for malaise/fatigue. Negative for chills, diaphoresis, fever and weight loss.  HENT: Negative for  nosebleeds and sore throat.   Eyes: Negative for double vision.  Respiratory: Negative for cough, hemoptysis, sputum production, shortness of breath and wheezing.   Cardiovascular: Negative for chest pain, palpitations, orthopnea and leg swelling.  Gastrointestinal: Negative for abdominal pain, blood in stool, constipation, diarrhea, heartburn, melena and vomiting.  Genitourinary: Negative for dysuria, frequency and urgency.  Musculoskeletal: Positive for back pain. Negative for joint pain.  Skin: Negative.  Negative for itching and rash.  Neurological: Positive for tingling. Negative for dizziness, focal weakness, weakness and headaches.  Endo/Heme/Allergies: Does not bruise/bleed easily.  Psychiatric/Behavioral: Negative for depression. The patient is not nervous/anxious and does not have insomnia.       PAST MEDICAL HISTORY :  Past Medical History:  Diagnosis Date  . Adenocarcinoma of right lung ([place]) 10/01/2015  . GERD (gastroesophageal reflux disease)   . GIB (gastrointestinal bleeding)   . Groin pain    Along superior/laterl R inguinal canal.  Could be due to hernia or old scar tissue.  prev with CT done w/o alarming findings.  Will treat episodically.  Use tylenol #3 prn.    . Hyperlipidemia   . Lipoma of thigh    Left  . Normal cardiac stress test 2014  . PONV (postoperative nausea and vomiting)    nausea after hysterectomy, and 2 days after lung surgery 10/2015  . Thyroid cancer ([place]) 12/2015  . Thyroid nodule     PAST SURGICAL HISTORY :   Past Surgical History:  Procedure Laterality Date  . ABDOMINAL HYSTERECTOMY    . BREAST EXCISIONAL BIOPSY Right 1977  . BREAST MASS EXCISION  1977   benign  . CARPAL TUNNEL RELEASE Right 1978  . CATARACT EXTRACTION, BILATERAL  Bilateral   . CHOLECYSTECTOMY  11/23/2016   Procedure: LAPAROSCOPIC CHOLECYSTECTOMY;  Surgeon: Clayburn Pert, MD;  Location: [facility];  Service: General;;  . COLONOSCOPY  2009  . COLONOSCOPY WITH PROPOFOL  N/A 05/18/2019   Procedure: COLONOSCOPY WITH PROPOFOL;  Surgeon: Virgel Manifold, MD;  Location: [facility];  Service: Endoscopy;  Laterality: N/A;  . COLONOSCOPY WITH PROPOFOL N/A 08/15/2019   Procedure: COLONOSCOPY WITH PROPOFOL;  Surgeon: Virgel Manifold, MD;  Location: [facility];  Service: Endoscopy;  Laterality: N/A;  . DILATION AND CURETTAGE OF UTERUS  2001  . ELECTROMAGNETIC NAVIGATION BROCHOSCOPY N/A 08/25/2015   Procedure: ELECTROMAGNETIC NAVIGATION BRONCHOSCOPY;  Surgeon: Flora Lipps, MD;  Location: [facility];  Service: Cardiopulmonary;  Laterality: N/A;  . ENDOBRONCHIAL ULTRASOUND N/A 08/25/2015   Procedure: ENDOBRONCHIAL ULTRASOUND;  Surgeon: Flora Lipps, MD;  Location: [facility];  Service: Cardiopulmonary;  Laterality: N/A;  . ESOPHAGOGASTRODUODENOSCOPY  2002, 11/15/07   Normal (Dr. Allyn Kenner)  . ESOPHAGOGASTRODUODENOSCOPY (EGD) WITH PROPOFOL N/A 05/18/2019   Procedure: ESOPHAGOGASTRODUODENOSCOPY (EGD) WITH PROPOFOL;  Surgeon: Virgel Manifold, MD;  Location: [facility];  Service: Endoscopy;  Laterality: N/A;  . ESOPHAGOGASTRODUODENOSCOPY (EGD) WITH PROPOFOL N/A 08/15/2019   Procedure: ESOPHAGOGASTRODUODENOSCOPY (EGD) WITH PROPOFOL;  Surgeon: Virgel Manifold, MD;  Location: [facility];  Service: Endoscopy;  Laterality: N/A;  . FACIAL COSMETIC SURGERY  01/13/10   mini facelift  . HERNIA REPAIR  1976  . OOPHORECTOMY    . THORACOTOMY/LOBECTOMY Right 10/27/2015   Procedure: THORACOTOMY/LOBECTOMY;  Surgeon: Nestor Lewandowsky, MD;  Location: [facility];  Service: Thoracic;  Laterality: Right;  . THYROIDECTOMY N/A 12/29/2015   Procedure: THYROIDECTOMY;  Surgeon: Beverly Gust, MD;  Location: [facility];  Service: ENT;  Laterality: N/A;  . TOTAL VAGINAL HYSTERECTOMY  12/30/08   for fibroid pain (Dr. Gertie Fey)    FAMILY HISTORY :   Family History  Problem Relation Age of Onset  . Transient ischemic attack Mother   . Hypertension Mother   . Stroke Mother         mini strokes  . Hypertension Sister   . Cancer Sister        ovarian  . Hypertension Sister   . Hypertension Sister   . Hypertension Sister   . Breast cancer Neg Hx   . Colon cancer Neg Hx     SOCIAL HISTORY:   Social History   Tobacco Use  . Smoking status: Never Smoker  . Smokeless tobacco: Never Used  Vaping Use  . Vaping Use: Never used  Substance Use Topics  . Alcohol use: No    Alcohol/week: 0.0 standard drinks  . Drug use: No    ALLERGIES:  is allergic to atorvastatin, ciprofloxacin, epinephrine, metoprolol succinate, simvastatin, sulfa antibiotics, and sulfonamide derivatives.  MEDICATIONS:  Current Outpatient Medications  Medication Sig Dispense Refill  . Ascorbic Acid (VITAMIN C) 500 MG tablet Take 2,000 mg by mouth daily. Reported on 02/04/2016    . aspirin EC 81 MG tablet Take 1 tablet (81 mg total) by mouth daily.    . Biotin (PA BIOTIN) 1000 MCG tablet Take 1,000 mcg by mouth daily. Reported on 02/04/2016    . CALCIUM-MAG-VIT C-VIT D PO Take by mouth daily.    . Cholecalciferol (VITAMIN D-3) 1000 units CAPS Take 1 capsule by mouth daily with supper.     . Coenzyme Q10 200 MG capsule Take 200 mg by mouth daily.    . Folic Acid-Vit Z6-XWR U04 (FA-VITAMIN B-6-VITAMIN B-12 PO) Take 1 tablet by mouth daily  after breakfast. Reported on 02/04/2016    . levothyroxine (SYNTHROID) 88 MCG tablet TAKE 1 TABLET (88 MCG TOTAL) BY MOUTH DAILY BEFORE BREAKFAST. 90 tablet 3  . Lutein 20 MG CAPS Take 20 mg by mouth daily.    . Omega-3 Fatty Acids (SUPER OMEGA 3 EPA/DHA) 1000 MG CAPS Take 1,000 mg by mouth daily with supper.    Marland Kitchen omeprazole (PRILOSEC) 40 MG capsule Take 1 capsule (40 mg total) by mouth daily. 90 capsule 3  . Potassium Gluconate 595 MG CAPS Take 595 mg by mouth daily.     . pravastatin (PRAVACHOL) 40 MG tablet Take 1 tablet (40 mg total) by mouth daily. 90 tablet 3  . triamcinolone cream (KENALOG) 0.1 % Apply 1 application topically 2 (two) times daily. To affected  areas 30 g 1   No current facility-administered medications for this visit.    PHYSICAL EXAMINATION: ECOG PERFORMANCE STATUS: 1 - Symptomatic but completely ambulatory  BP 135/60 (BP Location: Left Arm, Patient Position: Sitting, Cuff Size: Normal)   Pulse 85   Temp 97.6 F (36.4 C) (Tympanic)   Resp 16   Ht 5\' 4"  (1.626 m)   Wt 125 lb 9.6 oz (57 kg)   SpO2 100%   BMI 21.56 kg/m   Filed Weights   05/18/20 1339  Weight: 125 lb 9.6 oz (57 kg)    Physical Exam HENT:     Head: Normocephalic and atraumatic.     Mouth/Throat:     Pharynx: No oropharyngeal exudate.  Eyes:     Pupils: Pupils are equal, round, and reactive to light.  Cardiovascular:     Rate and Rhythm: Normal rate and regular rhythm.  Pulmonary:     Effort: No respiratory distress.     Breath sounds: No wheezing.  Abdominal:     General: Bowel sounds are normal. There is no distension.     Palpations: Abdomen is soft. There is no mass.     Tenderness: There is no abdominal tenderness. There is no guarding or rebound.  Musculoskeletal:        General: No tenderness. Normal range of motion.     Cervical back: Normal range of motion and neck supple.  Skin:    General: Skin is warm.  Neurological:     Mental Status: She is alert and oriented to person, place, and time.  Psychiatric:        Mood and Affect: Affect normal.    LABORATORY DATA:  I have reviewed the data as listed    Component Value Date/Time   NA 140 06/24/2019 0744   NA 141 07/01/2013 0103   K 4.7 06/24/2019 0744   K 3.8 07/01/2013 0103   CL 105 06/24/2019 0744   CL 110 (H) 07/01/2013 0103   CO2 28 06/24/2019 0744   CO2 27 07/01/2013 0103   GLUCOSE 97 06/24/2019 0744   GLUCOSE 88 07/01/2013 0103   BUN 16 06/24/2019 0744   BUN 11 07/01/2013 0103   CREATININE 0.72 06/24/2019 0744   CREATININE 0.68 07/01/2013 0103   CALCIUM 9.2 06/24/2019 0744   CALCIUM 8.2 (L) 07/01/2013 0103   PROT 6.7 06/24/2019 0744   PROT 5.5 (L) 07/01/2013  0103   ALBUMIN 4.6 06/24/2019 0744   ALBUMIN 3.0 (L) 07/01/2013 0103   AST 18 06/24/2019 0744   AST 19 07/01/2013 0103   ALT 14 06/24/2019 0744   ALT 24 07/01/2013 0103   ALKPHOS 51 06/24/2019 0744   ALKPHOS 55 07/01/2013 0103  BILITOT 0.6 06/24/2019 0744   BILITOT 0.7 07/01/2013 0103   GFRNONAA >60 05/19/2019 0117   GFRNONAA >60 07/01/2013 0103   GFRAA >60 05/19/2019 0117   GFRAA >60 07/01/2013 0103    No results found for: SPEP, UPEP  Lab Results  Component Value Date   WBC 6.3 06/24/2019   NEUTROABS 4.1 06/24/2019   HGB 13.9 06/24/2019   HCT 42.2 06/24/2019   MCV 91.6 06/24/2019   PLT 122.0 Repeated and verified X2. (L) 06/24/2019      Chemistry      Component Value Date/Time   NA 140 06/24/2019 0744   NA 141 07/01/2013 0103   K 4.7 06/24/2019 0744   K 3.8 07/01/2013 0103   CL 105 06/24/2019 0744   CL 110 (H) 07/01/2013 0103   CO2 28 06/24/2019 0744   CO2 27 07/01/2013 0103   BUN 16 06/24/2019 0744   BUN 11 07/01/2013 0103   CREATININE 0.72 06/24/2019 0744   CREATININE 0.68 07/01/2013 0103      Component Value Date/Time   CALCIUM 9.2 06/24/2019 0744   CALCIUM 8.2 (L) 07/01/2013 0103   ALKPHOS 51 06/24/2019 0744   ALKPHOS 55 07/01/2013 0103   AST 18 06/24/2019 0744   AST 19 07/01/2013 0103   ALT 14 06/24/2019 0744   ALT 24 07/01/2013 0103   BILITOT 0.6 06/24/2019 0744   BILITOT 0.7 07/01/2013 0103       RADIOGRAPHIC STUDIES: I have personally reviewed the radiological images as listed and agreed with the findings in the report. No results found.   ASSESSMENT & PLAN:  Cancer of lower lobe of right lung ([place]) # RLL Lung ca; Stage I;  no adjuvant therapy. July 16th 2021--no obvious evidence of recurrence; however right upper lobe groundglass opacity with 3 mm [see discussion below]. STABLE.   #RUL- Groundglass opacity-10 mm/3 mm solid nodule-question low-grade malignancy versus others-STABLE. Recommend follow up imaging in 6 months.   # Thyroid  cancer status post thyroidectomy low risk stage I; on Synthroid 88 mcg.  January 2021 -TSH- 0.6; detectable thyroglobulin-pending today   Positive for thyroglobulin antibody.  Monitor for now.  # Jan 2021-  BMD- Osteopenia- T score=- 2.0; improved from 4 years ago; ca+vit D BID.     DISPOSITION:  # follow up in 6 months-MD [[facility]/ thyroid profile/ Thyroiglobulin/and thyroglobulin antibodies- 1 week prior]; CT chest prior- Dr.B  # I reviewed the blood work- with the patient in detail; also reviewed the imaging independently [as summarized above]; and with the patient in detail.         Orders Placed This Encounter  Procedures  . CT CHEST WO CONTRAST    Standing Status:   Future    Standing Expiration Date:   05/18/2021    Order Specific Question:   Preferred imaging location?    Answer:   [facility]    Order Specific Question:   Radiology Contrast Protocol - do NOT remove file path    Answer:   [email or web address]    Order Specific Question:   ** REASON FOR EXAM (FREE TEXT)    Answer:   lung cancer   All questions were answered. The patient knows to call the clinic with any problems, questions or concerns.      Cammie Sickle, MD 05/18/2020 9:16 PM

## 2020-05-18 NOTE — Assessment & Plan Note (Addendum)
#   RLL Lung ca; Stage I;  no adjuvant therapy. July 16th 2021--no obvious evidence of recurrence; however right upper lobe groundglass opacity with 3 mm [see discussion below]. STABLE.   #RUL- Groundglass opacity-10 mm/3 mm solid nodule-question low-grade malignancy versus others-STABLE. Recommend follow up imaging in 6 months.   # Thyroid cancer status post thyroidectomy low risk stage I; on Synthroid 88 mcg.  January 2021 -TSH- 0.6; detectable thyroglobulin-pending today   Positive for thyroglobulin antibody.  Monitor for now.  # Jan 2021-  BMD- Osteopenia- T score=- 2.0; improved from 4 years ago; ca+vit D BID.     DISPOSITION:  # follow up in 6 months-MD [[facility]/ thyroid profile/ Thyroiglobulin/and thyroglobulin antibodies- 1 week prior]; CT chest prior- Dr.B  # I reviewed the blood work- with the patient in detail; also reviewed the imaging independently [as summarized above]; and with the patient in detail.

## 2020-05-19 ENCOUNTER — Encounter: Payer: Self-pay | Admitting: Internal Medicine

## 2020-05-28 ENCOUNTER — Other Ambulatory Visit: Payer: Self-pay | Admitting: Family Medicine

## 2020-06-10 ENCOUNTER — Other Ambulatory Visit: Payer: Self-pay

## 2020-06-10 ENCOUNTER — Ambulatory Visit (INDEPENDENT_AMBULATORY_CARE_PROVIDER_SITE_OTHER): Payer: PPO

## 2020-06-10 DIAGNOSIS — Z0000 Encounter for general adult medical examination without abnormal findings: Secondary | ICD-10-CM

## 2020-06-10 NOTE — Progress Notes (Signed)
Subjective:   [name] is a 74 y.o. female who presents for Medicare Annual (Subsequent) preventive examination.  Review of Systems: N/A      I connected with the patient today by telephone and verified that I am speaking with the correct person using two identifiers. Location patient: home Location nurse: work Persons participating in the virtual visit: patient, Marine scientist.   I discussed the limitations, risks, security and privacy concerns of performing an evaluation and management service by telephone and the availability of in person appointments. I also discussed with the patient that there may be a patient responsible charge related to this service. The patient expressed understanding and verbally consented to this telephonic visit.    Interactive audio and video telecommunications were attempted between this nurse and patient, however failed, due to patient having technical difficulties OR patient did not have access to video capability.  We continued and completed visit with audio only.     Cardiac Risk Factors include: advanced age (>23men, >76 women);hypertension;dyslipidemia     Objective:    Today's Vitals   There is no height or weight on file to calculate BMI.  Advanced Directives 06/10/2020 05/15/2020 11/18/2019 08/15/2019 07/23/2019 05/17/2019 05/16/2019  Does Patient Have a Medical Advance Directive? Yes Yes Yes Yes Yes - Yes  Type of Advance Directive [place];Living will [place];Living will [place];Living will [place];Living will - [place];Living will [place];Living will  Does patient want to make changes to medical advance directive? - No - Patient declined No - Patient declined - - No - Patient declined No - Patient declined  Copy of [place] in Chart? Yes - validated most recent copy scanned in chart (See row information)  Yes - validated most recent copy scanned in chart (See row information) No - copy requested - - No - copy requested No - copy requested  Would patient like information on creating a medical advance directive? - No - Patient declined No - Patient declined - No - Patient declined No - Patient declined No - Patient declined    Current Medications (verified) Outpatient Encounter Medications as of 06/10/2020  Medication Sig  . Ascorbic Acid (VITAMIN C) 500 MG tablet Take 2,000 mg by mouth daily. Reported on 02/04/2016  . aspirin EC 81 MG tablet Take 1 tablet (81 mg total) by mouth daily.  . Biotin (PA BIOTIN) 1000 MCG tablet Take 1,000 mcg by mouth daily. Reported on 02/04/2016  . CALCIUM-MAG-VIT C-VIT D PO Take by mouth daily.  . Cholecalciferol (VITAMIN D-3) 1000 units CAPS Take 1 capsule by mouth daily with supper.   . Coenzyme Q10 200 MG capsule Take 200 mg by mouth daily.  . Folic Acid-Vit T6-AUQ J33 (FA-VITAMIN B-6-VITAMIN B-12 PO) Take 1 tablet by mouth daily after breakfast. Reported on 02/04/2016  . levothyroxine (SYNTHROID) 88 MCG tablet TAKE 1 TABLET (88 MCG TOTAL) BY MOUTH DAILY BEFORE BREAKFAST.  Marland Kitchen Lutein 20 MG CAPS Take 20 mg by mouth daily.  . Omega-3 Fatty Acids (SUPER OMEGA 3 EPA/DHA) 1000 MG CAPS Take 1,000 mg by mouth daily with supper.  Marland Kitchen omeprazole (PRILOSEC) 40 MG capsule TAKE 1 CAPSULE BY MOUTH EVERY DAY  . Potassium Gluconate 595 MG CAPS Take 595 mg by mouth daily.   . pravastatin (PRAVACHOL) 40 MG tablet Take 1 tablet (40 mg total) by mouth daily.  Marland Kitchen triamcinolone cream (KENALOG) 0.1 % Apply 1 application  topically 2 (two) times daily. To affected areas  . [DISCONTINUED] Calcium Carbonate (CALCIUM 500 PO) Take 1 tablet by mouth every morning.   . [DISCONTINUED] calcium gluconate 500 MG tablet Take 500 mg by mouth daily.    . [DISCONTINUED] metoprolol succinate (TOPROL-XL) 25 MG 24 hr tablet Take 25 mg by mouth at bedtime.    . [DISCONTINUED] simvastatin (ZOCOR) 40 MG tablet Take  40 mg by mouth at bedtime.     No facility-administered encounter medications on file as of 06/10/2020.    Allergies (verified) Atorvastatin, Ciprofloxacin, Epinephrine, Metoprolol succinate, Simvastatin, Sulfa antibiotics, and Sulfonamide derivatives   History: Past Medical History:  Diagnosis Date  . 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 GERD (gastroesophageal reflux disease)   . GIB (gastrointestinal bleeding)   . Groin pain    Along superior/laterl R inguinal canal.  Could be due to hernia or old scar tissue.  prev with CT done w/o alarming findings.  Will treat episodically.  Use tylenol #3 prn.    . Hyperlipidemia   . Lipoma of thigh    Left  . Normal cardiac stress test 2014  . PONV (postoperative nausea and vomiting)    nausea after hysterectomy, and 2 days after lung surgery 10/2015  . Thyroid cancer ([place]) 12/2015  . Thyroid nodule    Past Surgical History:  Procedure Laterality Date  . ABDOMINAL HYSTERECTOMY    . BREAST EXCISIONAL BIOPSY Right 1977  . BREAST MASS EXCISION  1977   benign  . CARPAL TUNNEL RELEASE Right 1978  . CATARACT EXTRACTION, BILATERAL Bilateral   . CHOLECYSTECTOMY  11/23/2016   Procedure: LAPAROSCOPIC CHOLECYSTECTOMY;  Surgeon: Clayburn Pert, MD;  Location: [facility];  Service: General;;  . COLONOSCOPY  2009  . COLONOSCOPY WITH PROPOFOL N/A 05/18/2019   Procedure: COLONOSCOPY WITH PROPOFOL;  Surgeon: Virgel Manifold, MD;  Location: [facility];  Service: Endoscopy;  Laterality: N/A;  . COLONOSCOPY WITH PROPOFOL N/A 08/15/2019   Procedure: COLONOSCOPY WITH PROPOFOL;  Surgeon: Virgel Manifold, MD;  Location: [facility];  Service: Endoscopy;  Laterality: N/A;  . DILATION AND CURETTAGE OF UTERUS  2001  . ELECTROMAGNETIC NAVIGATION BROCHOSCOPY N/A 08/25/2015   Procedure: ELECTROMAGNETIC NAVIGATION BRONCHOSCOPY;  Surgeon: Flora Lipps, MD;  Location: [facility];  Service: Cardiopulmonary;  Laterality: N/A;  . ENDOBRONCHIAL  ULTRASOUND N/A 08/25/2015   Procedure: ENDOBRONCHIAL ULTRASOUND;  Surgeon: Flora Lipps, MD;  Location: [facility];  Service: Cardiopulmonary;  Laterality: N/A;  . ESOPHAGOGASTRODUODENOSCOPY  2002, 11/15/07   Normal (Dr. Allyn Kenner)  . ESOPHAGOGASTRODUODENOSCOPY (EGD) WITH PROPOFOL N/A 05/18/2019   Procedure: ESOPHAGOGASTRODUODENOSCOPY (EGD) WITH PROPOFOL;  Surgeon: Virgel Manifold, MD;  Location: [facility];  Service: Endoscopy;  Laterality: N/A;  . ESOPHAGOGASTRODUODENOSCOPY (EGD) WITH PROPOFOL N/A 08/15/2019   Procedure: ESOPHAGOGASTRODUODENOSCOPY (EGD) WITH PROPOFOL;  Surgeon: Virgel Manifold, MD;  Location: [facility];  Service: Endoscopy;  Laterality: N/A;  . FACIAL COSMETIC SURGERY  01/13/10   mini facelift  . HERNIA REPAIR  1976  . OOPHORECTOMY    . THORACOTOMY/LOBECTOMY Right 10/27/2015   Procedure: THORACOTOMY/LOBECTOMY;  Surgeon: Nestor Lewandowsky, MD;  Location: [facility];  Service: Thoracic;  Laterality: Right;  . THYROIDECTOMY N/A 12/29/2015   Procedure: THYROIDECTOMY;  Surgeon: Beverly Gust, MD;  Location: [facility];  Service: ENT;  Laterality: N/A;  . TOTAL VAGINAL HYSTERECTOMY  12/30/08   for fibroid pain (Dr. Gertie Fey)   Family History  Problem Relation Age of Onset  . Transient ischemic attack Mother   . Hypertension Mother   .  Stroke Mother        mini strokes  . Hypertension Sister   . Cancer Sister        ovarian  . Hypertension Sister   . Hypertension Sister   . Hypertension Sister   . Breast cancer Neg Hx   . Colon cancer Neg Hx    Social History   Socioeconomic History  . Marital status: Married    Spouse name: Not on file  . Number of children: Not on file  . Years of education: Not on file  . Highest education level: Not on file  Occupational History  . 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 Smoking status: Never Smoker  . Smokeless tobacco: Never Used  Vaping Use  . Vaping Use: Never used  Substance and Sexual Activity  .  Alcohol use: No    Alcohol/week: 0.0 standard drinks  . Drug use: No  . Sexual activity: Never  Other Topics Concern  . Not on file  Social History Narrative   Married 1963 and lives with husband (he had a CVA 11/12, to SNF and then home as of 12/2011)   Retired from medical office 2013.   1 son.     Social Determinants of Health   Financial Resource Strain: Low Risk   . Difficulty of Paying Living Expenses: Not hard at all  Food Insecurity: No Food Insecurity  . Worried About Charity fundraiser in the Last Year: Never true  . Ran Out of Food in the Last Year: Never true  Transportation Needs: No Transportation Needs  . Lack of Transportation (Medical): No  . Lack of Transportation (Non-Medical): No  Physical Activity: Sufficiently Active  . Days of Exercise per Week: 3 days  . Minutes of Exercise per Session: 60 min  Stress: No Stress Concern Present  . Feeling of Stress : Not at all  Social Connections:   . Frequency of Communication with Friends and Family:   . Frequency of Social Gatherings with Friends and Family:   . Attends Religious Services:   . Active Member of Clubs or Organizations:   . Attends Archivist Meetings:   Marland Kitchen Marital Status:     Tobacco Counseling Counseling given: Not Answered   Clinical Intake:  Pre-visit preparation completed: Yes  Pain : No/denies pain     Nutritional Risks: None Diabetes: No  How often do you need to have someone help you when you read instructions, pamphlets, or other written materials from your doctor or pharmacy?: 1 - Never What is the last grade level you completed in school?: business school  Diabetic: No Nutrition Risk Assessment:  Has the patient had any N/V/D within the last 2 months?  No  Does the patient have any non-healing wounds?  No  Has the patient had any unintentional weight loss or weight gain?  No   Diabetes:  Is the patient diabetic?  No  If diabetic, was a CBG obtained today?  N/A  Did the patient bring in their glucometer from home?  N/A How often do you monitor your CBG's? N/A.   Financial Strains and Diabetes Management:  Are you having any financial strains with the device, your supplies or your medication? N/A.  Does the patient want to be seen by Chronic Care Management for management of their diabetes?  N/A Would the patient like to be referred to a Nutritionist or for Diabetic Management?  N/A     Interpreter Needed?: No  Information entered by :: CJohnson, LPN   Activities of Daily Living In your present state of health, do you have any difficulty performing the following activities: 06/10/2020  Hearing? N  Vision? N  Difficulty concentrating or making decisions? N  Walking or climbing stairs? N  Dressing or bathing? N  Doing errands, shopping? N  Preparing Food and eating ? N  Using the Toilet? N  In the past six months, have you accidently leaked urine? N  Do you have problems with loss of bowel control? N  Managing your Medications? N  Managing your Finances? N  Housekeeping or managing your Housekeeping? N  Some recent data might be hidden    Patient Care Team: Tonia Ghent, MD as PCP - General (Family Medicine) Kate Sable, MD as PCP - Cardiology (Cardiology) Beverly Gust, MD as Consulting Physician (Otolaryngology) Nestor Lewandowsky, MD as Consulting Physician (Cardiothoracic Surgery) Cammie Sickle, MD as Consulting Physician (Internal Medicine) Birder Robson, MD as Consulting Physician (Ophthalmology) Clayburn Pert, MD as Consulting Physician (General Surgery)  Indicate any recent Medical Services you may have received from other than [facility] providers in the past year (date may be approximate).     Assessment:   This is a routine wellness examination for [name].  Hearing/Vision screen  Hearing Screening   125Hz  250Hz  500Hz  1000Hz  2000Hz  3000Hz  4000Hz  6000Hz  8000Hz   Right ear:           Left ear:            Vision Screening Comments: Patient gets annual eye exams   Dietary issues and exercise activities discussed: Current Exercise Habits: Home exercise routine, Type of exercise: walking, Time (Minutes): 60, Frequency (Times/Week): 3, Weekly Exercise (Minutes/Week): 180, Intensity: Moderate, Exercise limited by: None identified  Goals    .  Increase physical activity (pt-stated)      Starting 05/23/2017, I will continue to exercise for at least 30 min daily.     .  Patient Stated      06/10/2020, I will continue to walk for about 2-3 days a week for 1 hour.       Depression Screen PHQ 2/9 Scores 06/10/2020 06/27/2019 05/23/2017 05/20/2016 05/21/2015 05/13/2014 05/07/2013  PHQ - 2 Score 0 0 1 0 0 0 0  PHQ- 9 Score 0 - - - - - -    Fall Risk Fall Risk  06/10/2020 06/27/2019 05/23/2017 05/20/2016 05/21/2015  Falls in the past year? 0 0 No No No  Number falls in past yr: 0 - - - -  Injury with Fall? 0 - - - -  Risk for fall due to : Medication side effect - - - -  Follow up Falls evaluation completed;Falls prevention discussed - - - -    Any stairs in or around the home? Yes  If so, are there any without handrails? No  Home free of loose throw rugs in walkways, pet beds, electrical cords, etc? Yes  Adequate lighting in your home to reduce risk of falls? Yes   ASSISTIVE DEVICES UTILIZED TO PREVENT FALLS:  Life alert? No  Use of a cane, walker or w/c? No  Grab bars in the bathroom? No  Shower chair or bench in shower? No  Elevated toilet seat or a handicapped toilet? No   TIMED UP AND GO:  Was the test performed? N/A, telephonic visit .    Cognitive Function: MMSE - Mini Mental State Exam 06/10/2020 05/23/2017 05/20/2016  Orientation to time 5 5 5  Orientation to Place 5 5 5   Registration 3 3 3   Attention/ Calculation 5 0 0  Recall 3 3 3   Language- name 2 objects - 0 0  Language- repeat 1 1 1   Language- follow 3 step command - 3 3  Language- read & follow direction - 0 0  Write a  sentence - 0 0  Copy design - 0 0  Total score - 20 20  Mini Cog  Mini-Cog screen was completed. Maximum score is 22. A value of 0 denotes this part of the MMSE was not completed or the patient failed this part of the Mini-Cog screening.       Immunizations Immunization History  Administered Date(s) Administered  . Fluad Quad(high Dose 65+) 06/27/2019  . Influenza Split 08/10/2012  . Influenza, High Dose Seasonal PF 08/01/2014, 08/10/2016, 07/18/2017, 07/12/2018  . Influenza,inj,Quad PF,6+ Mos 08/13/2015  . Influenza-Unspecified 07/01/2013, 07/01/2014, 07/18/2017  . PFIZER SARS-COV-2 Vaccination 11/21/2019, 12/12/2019  . Pneumococcal Conjugate-13 05/13/2014  . Pneumococcal Polysaccharide-23 02/17/2010  . Td 02/11/2009  . Zoster 05/07/2010    TDAP status: Due, Education has been provided regarding the importance of this vaccine. Advised may receive this vaccine at local pharmacy or Health Dept. Aware to provide a copy of the vaccination record if obtained from local pharmacy or Health Dept. Verbalized acceptance and understanding. Flu Vaccine status: due, will be available at the office at the end of August Pneumococcal vaccine status: Up to date Covid-19 vaccine status: Completed vaccines  Qualifies for Shingles Vaccine? Yes   Zostavax completed Yes   Shingrix Completed?: No.    Education has been provided regarding the importance of this vaccine. Patient has been advised to call insurance company to determine out of pocket expense if they have not yet received this vaccine. Advised may also receive vaccine at local pharmacy or Health Dept. Verbalized acceptance and understanding.  Screening Tests Health Maintenance  Topic Date Due  . TETANUS/TDAP  02/12/2019  . INFLUENZA VACCINE  05/31/2020  . COLONOSCOPY  08/14/2022  . DEXA SCAN  Completed  . COVID-19 Vaccine  Completed  . Hepatitis C Screening  Completed  . PNA vac Low Risk Adult  Completed    Health Maintenance   Health Maintenance Due  Topic Date Due  . TETANUS/TDAP  02/12/2019  . INFLUENZA VACCINE  05/31/2020    Colorectal cancer screening: Completed 08/15/2019. Repeat every 3 years Mammogram status: Completed 08/15/2019. Repeat every year Bone Density status: Completed 11/27/2019. Results reflect: Bone density results: OSTEOPENIA. Repeat every 2 years.  Lung Cancer Screening: (Low Dose CT Chest recommended if Age 2-80 years, 30 pack-year currently smoking OR have quit w/in 15 years.) does not qualify.    Additional Screening:  Hepatitis C Screening: does not qualify; Completed N/A  Vision Screening: Recommended annual ophthalmology exams for early detection of glaucoma and other disorders of the eye. Is the patient up to date with their annual eye exam?  Yes  Who is the provider or what is the name of the office in which the patient attends annual eye exams? Dr. Gloriann Loan  If pt is not established with a provider, would they like to be referred to a provider to establish care? No .   Dental Screening: Recommended annual dental exams for proper oral hygiene  Community Resource Referral / Chronic Care Management: CRR required this visit?  No   CCM required this visit?  No      Plan:     I have personally reviewed and  noted the following in the patient's chart:   . Medical and social history . Use of alcohol, tobacco or illicit drugs  . Current medications and supplements . Functional ability and status . Nutritional status . Physical activity . Advanced directives . List of other physicians . Hospitalizations, surgeries, and ER visits in previous 12 months . Vitals . Screenings to include cognitive, depression, and falls . Referrals and appointments  In addition, I have reviewed and discussed with patient certain preventive protocols, quality metrics, and best practice recommendations. A written personalized care plan for preventive services as well as general preventive health  recommendations were provided to patient.   Due to this being a telephonic visit, the after visit summary with patients personalized plan was offered to patient via mail or my-chart. Patient preferred to pick up at office at next visit.   Andrez Grime, LPN   [id number]

## 2020-06-10 NOTE — Progress Notes (Signed)
PCP notes:  Health Maintenance: Tdap- insurance/financial   Abnormal Screenings: none   Patient concerns: Abdominal discomfort   Nurse concerns: none   Next PCP appt.: 06/29/2020 @ 10:30 am

## 2020-06-10 NOTE — Patient Instructions (Signed)
[name] , Thank you for taking time to come for your Medicare Wellness Visit. I appreciate your ongoing commitment to your health goals. Please review the following plan we discussed and let me know if I can assist you in the future.   Screening recommendations/referrals: Colonoscopy: Up to date, completed 08/15/2019, due 07/2022 Mammogram: Up to date, completed 08/15/2019, due 07/2020 Bone Density: Up to date, completed 11/27/2019, due 10/2021 Recommended yearly ophthalmology/optometry visit for glaucoma screening and checkup Recommended yearly dental visit for hygiene and checkup  Vaccinations: Influenza vaccine: due, will be available at the office at the end of August Pneumococcal vaccine: Completed series Tdap vaccine: decline- insurance/financial Shingles vaccine: due, check with insurance regarding coverage   Covid-19:Completed series  Advanced directives: copy in chart  Conditions/risks identified: hypertension, hyperlipidemia  Next appointment: Follow up in one year for your annual wellness visit    Preventive Care 37 Years and Older, Female Preventive care refers to lifestyle choices and visits with your health care provider that can promote health and wellness. What does preventive care include?  A yearly physical exam. This is also called an annual well check.  Dental exams once or twice a year.  Routine eye exams. Ask your health care provider how often you should have your eyes checked.  Personal lifestyle choices, including:  Daily care of your teeth and gums.  Regular physical activity.  Eating a healthy diet.  Avoiding tobacco and drug use.  Limiting alcohol use.  Practicing safe sex.  Taking low-dose aspirin every day.  Taking vitamin and mineral supplements as recommended by your health care provider. What happens during an annual well check? The services and screenings done by your health care provider during your annual well check will depend  on your age, overall health, lifestyle risk factors, and family history of disease. Counseling  Your health care provider may ask you questions about your:  Alcohol use.  Tobacco use.  Drug use.  Emotional well-being.  Home and relationship well-being.  Sexual activity.  Eating habits.  History of falls.  Memory and ability to understand (cognition).  Work and work Statistician.  Reproductive health. Screening  You may have the following tests or measurements:  Height, weight, and BMI.  Blood pressure.  Lipid and cholesterol levels. These may be checked every 5 years, or more frequently if you are over 10 years old.  Skin check.  Lung cancer screening. You may have this screening every year starting at age 67 if you have a 30-pack-year history of smoking and currently smoke or have quit within the past 15 years.  Fecal occult blood test (FOBT) of the stool. You may have this test every year starting at age 61.  Flexible sigmoidoscopy or colonoscopy. You may have a sigmoidoscopy every 5 years or a colonoscopy every 10 years starting at age 38.  Hepatitis C blood test.  Hepatitis B blood test.  Sexually transmitted disease (STD) testing.  Diabetes screening. This is done by checking your blood sugar (glucose) after you have not eaten for a while (fasting). You may have this done every 1-3 years.  Bone density scan. This is done to screen for osteoporosis. You may have this done starting at age 89.  Mammogram. This may be done every 1-2 years. Talk to your health care provider about how often you should have regular mammograms. Talk with your health care provider about your test results, treatment options, and if necessary, the need for more tests. Vaccines  Your health  care provider may recommend certain vaccines, such as:  Influenza vaccine. This is recommended every year.  Tetanus, diphtheria, and acellular pertussis (Tdap, Td) vaccine. You may need a Td  booster every 10 years.  Zoster vaccine. You may need this after age 81.  Pneumococcal 13-valent conjugate (PCV13) vaccine. One dose is recommended after age 34.  Pneumococcal polysaccharide (PPSV23) vaccine. One dose is recommended after age 46. Talk to your health care provider about which screenings and vaccines you need and how often you need them. This information is not intended to replace advice given to you by your health care provider. Make sure you discuss any questions you have with your health care provider. Document Released: 11/13/2015 Document Revised: 07/06/2016 Document Reviewed: 08/18/2015 Elsevier Interactive Patient Education  2017 [employer] Prevention in the Home Falls can cause injuries. They can happen to people of all ages. There are many things you can do to make your home safe and to help prevent falls. What can I do on the outside of my home?  Regularly fix the edges of walkways and driveways and fix any cracks.  Remove anything that might make you trip as you walk through a door, such as a raised step or threshold.  Trim any bushes or trees on the path to your home.  Use bright outdoor lighting.  Clear any walking paths of anything that might make someone trip, such as rocks or tools.  Regularly check to see if handrails are loose or broken. Make sure that both sides of any steps have handrails.  Any raised decks and porches should have guardrails on the edges.  Have any leaves, snow, or ice cleared regularly.  Use sand or salt on walking paths during winter.  Clean up any spills in your garage right away. This includes oil or grease spills. What can I do in the bathroom?  Use night lights.  Install grab bars by the toilet and in the tub and shower. Do not use towel bars as grab bars.  Use non-skid mats or decals in the tub or shower.  If you need to sit down in the shower, use a plastic, non-slip stool.  Keep the floor dry. Clean up  any water that spills on the floor as soon as it happens.  Remove soap buildup in the tub or shower regularly.  Attach bath mats securely with double-sided non-slip rug tape.  Do not have throw rugs and other things on the floor that can make you trip. What can I do in the bedroom?  Use night lights.  Make sure that you have a light by your bed that is easy to reach.  Do not use any sheets or blankets that are too big for your bed. They should not hang down onto the floor.  Have a firm chair that has side arms. You can use this for support while you get dressed.  Do not have throw rugs and other things on the floor that can make you trip. What can I do in the kitchen?  Clean up any spills right away.  Avoid walking on wet floors.  Keep items that you use a lot in easy-to-reach places.  If you need to reach something above you, use a strong step stool that has a grab bar.  Keep electrical cords out of the way.  Do not use floor polish or wax that makes floors slippery. If you must use wax, use non-skid floor wax.  Do not have  throw rugs and other things on the floor that can make you trip. What can I do with my stairs?  Do not leave any items on the stairs.  Make sure that there are handrails on both sides of the stairs and use them. Fix handrails that are broken or loose. Make sure that handrails are as long as the stairways.  Check any carpeting to make sure that it is firmly attached to the stairs. Fix any carpet that is loose or worn.  Avoid having throw rugs at the top or bottom of the stairs. If you do have throw rugs, attach them to the floor with carpet tape.  Make sure that you have a light switch at the top of the stairs and the bottom of the stairs. If you do not have them, ask someone to add them for you. What else can I do to help prevent falls?  Wear shoes that:  Do not have high heels.  Have rubber bottoms.  Are comfortable and fit you well.  Are  closed at the toe. Do not wear sandals.  If you use a stepladder:  Make sure that it is fully opened. Do not climb a closed stepladder.  Make sure that both sides of the stepladder are locked into place.  Ask someone to hold it for you, if possible.  Clearly mark and make sure that you can see:  Any grab bars or handrails.  First and last steps.  Where the edge of each step is.  Use tools that help you move around (mobility aids) if they are needed. These include:  Canes.  Walkers.  Scooters.  Crutches.  Turn on the lights when you go into a dark area. Replace any light bulbs as soon as they burn out.  Set up your furniture so you have a clear path. Avoid moving your furniture around.  If any of your floors are uneven, fix them.  If there are any pets around you, be aware of where they are.  Review your medicines with your doctor. Some medicines can make you feel dizzy. This can increase your chance of falling. Ask your doctor what other things that you can do to help prevent falls. This information is not intended to replace advice given to you by your health care provider. Make sure you discuss any questions you have with your health care provider. Document Released: 08/13/2009 Document Revised: 03/24/2016 Document Reviewed: 11/21/2014 Elsevier Interactive Patient Education  2017 [employer].

## 2020-06-20 ENCOUNTER — Other Ambulatory Visit: Payer: Self-pay | Admitting: Family Medicine

## 2020-06-20 DIAGNOSIS — E785 Hyperlipidemia, unspecified: Secondary | ICD-10-CM

## 2020-06-20 DIAGNOSIS — M8580 Other specified disorders of bone density and structure, unspecified site: Secondary | ICD-10-CM

## 2020-06-22 ENCOUNTER — Other Ambulatory Visit: Payer: Self-pay

## 2020-06-22 ENCOUNTER — Other Ambulatory Visit (INDEPENDENT_AMBULATORY_CARE_PROVIDER_SITE_OTHER): Payer: PPO

## 2020-06-22 DIAGNOSIS — E785 Hyperlipidemia, unspecified: Secondary | ICD-10-CM | POA: Diagnosis not present

## 2020-06-22 DIAGNOSIS — M8580 Other specified disorders of bone density and structure, unspecified site: Secondary | ICD-10-CM

## 2020-06-22 LAB — LIPID PANEL
Cholesterol: 147 mg/dL (ref 0–200)
HDL: 41.9 mg/dL (ref >39.00)
LDL Cholesterol: 77 mg/dL (ref 0–99)
NonHDL: 105.33
Total CHOL/HDL Ratio: 4
Triglycerides: 144 mg/dL (ref 0.0–149.0)
VLDL: 28.8 mg/dL (ref 0.0–40.0)

## 2020-06-22 LAB — VITAMIN D 25 HYDROXY (VIT D DEFICIENCY, FRACTURES): VITD: 36.62 ng/mL (ref 30.00–100.00)

## 2020-06-29 ENCOUNTER — Ambulatory Visit (INDEPENDENT_AMBULATORY_CARE_PROVIDER_SITE_OTHER): Payer: PPO | Admitting: Family Medicine

## 2020-06-29 ENCOUNTER — Other Ambulatory Visit: Payer: Self-pay

## 2020-06-29 ENCOUNTER — Encounter: Payer: Self-pay | Admitting: Family Medicine

## 2020-06-29 DIAGNOSIS — B079 Viral wart, unspecified: Secondary | ICD-10-CM | POA: Diagnosis not present

## 2020-06-29 DIAGNOSIS — R109 Unspecified abdominal pain: Secondary | ICD-10-CM | POA: Diagnosis not present

## 2020-06-29 DIAGNOSIS — Z0000 Encounter for general adult medical examination without abnormal findings: Secondary | ICD-10-CM

## 2020-06-29 DIAGNOSIS — E7800 Pure hypercholesterolemia, unspecified: Secondary | ICD-10-CM | POA: Diagnosis not present

## 2020-06-29 DIAGNOSIS — E039 Hypothyroidism, unspecified: Secondary | ICD-10-CM | POA: Diagnosis not present

## 2020-06-29 DIAGNOSIS — Z7189 Other specified counseling: Secondary | ICD-10-CM

## 2020-06-29 MED ORDER — SHINGRIX 50 MCG/0.5ML IM SUSR: 0.5000 mL | Freq: Once | INTRAMUSCULAR | 1 refills | Status: AC

## 2020-06-29 NOTE — Patient Instructions (Addendum)
Your deserve a lot of credit.  Thanks for your effort.  I would get a covid booster and flu shot this fall.   Shingles shot when possible.  If the abdominal pain comes back then let me know.   Take care.  Glad to see you.

## 2020-06-29 NOTE — Progress Notes (Signed)
This visit occurred during the SARS-CoV-2 public health emergency.  Safety protocols were in place, including screening questions prior to the visit, additional usage of staff PPE, and extensive cleaning of exam room while observing appropriate contact time as indicated for disinfecting solutions.  Abd discomfort.  Noted on either side of the umbilicus then occ in the RUQ and LUQ.  Can be in the RLQ.  PSH noted.  She had prev neg cards eval.  Sx come and go, not everyday.  Unclear trigger.  Sx are better in the meantime.  It went on for a few weeks but it got better.  No blood in stool.  No fevers.  Some loose stools at the time, better now.    Hypothyroidism.  18mcg daily.  Per hematology clinic.  I'll defer, she agrees.    Elevated Cholesterol: Using medications without problems: yes Muscle aches: no Diet compliance: encouraged.   Exercise:encouraged.   Home situation d/w pt.  She is caring for her husband who has memory loss from a CVA.  Her son was admitted to hospital then hospice but improved with sobriety.  He is living with patient, at her home.  He is sober currently.  She is safe at home.   Pt told her son he can't live with her if he is drinking.    Vaccines d/w pt.  Due for shingrix and flu when possible.   She is going to call about mammogram when possible.   DXA 2021 Advance directive- d/w pt. Has a living will. Husband, then patient's sister Wynn Banker, then her niece Magdalene Patricia designated in that order should patient be incapacitated. She doesn't want her son designated to make decisions.   Colonoscopy 2020  PMH and SH reviewed Meds, vitals, and allergies reviewed.   ROS: Per HPI unless specifically indicated in ROS section   GEN: nad, alert and oriented HEENT: ncat NECK: supple w/o LA CV: rrr. PULM: ctab, no inc wob ABD: soft, +bs EXT: no edema SKIN: no acute rash but 3 warts on R 3rd finger.  Treated x3 with liq N2 after getting verbal informed consent and  talking about options.    At least 30 minutes were devoted to patient care in this encounter (this can potentially include time spent reviewing the patient's file/history, interviewing and examining the patient, counseling/reviewing plan with patient, ordering referrals, ordering tests, reviewing relevant laboratory or x-ray data, and documenting the encounter).

## 2020-06-30 ENCOUNTER — Other Ambulatory Visit: Payer: Self-pay | Admitting: Family Medicine

## 2020-06-30 DIAGNOSIS — Z1231 Encounter for screening mammogram for malignant neoplasm of breast: Secondary | ICD-10-CM

## 2020-07-01 ENCOUNTER — Other Ambulatory Visit: Payer: Self-pay | Admitting: Family Medicine

## 2020-07-01 NOTE — Telephone Encounter (Signed)
Has tolerated.  Okay to continue.  Sent.  Thanks.

## 2020-07-01 NOTE — Telephone Encounter (Signed)
Refill request Pravastatin Last refill 06/27/19 #90/3 Last office visit 06/29/20 See allergy/contraindication

## 2020-07-02 DIAGNOSIS — B079 Viral wart, unspecified: Secondary | ICD-10-CM | POA: Insufficient documentation

## 2020-07-02 DIAGNOSIS — E039 Hypothyroidism, unspecified: Secondary | ICD-10-CM | POA: Insufficient documentation

## 2020-07-02 NOTE — Assessment & Plan Note (Signed)
Advance directive- d/w pt. Has a living will. Husband, then patient's sister Wynn Banker, then her niece Magdalene Patricia designated in that order should patient be incapacitated. She doesn't want her son designated to make decisions.

## 2020-07-02 NOTE — Assessment & Plan Note (Signed)
Continue pravastatin.  No adverse effect on medication.  Labs discussed with patient.

## 2020-07-02 NOTE — Assessment & Plan Note (Signed)
Vaccines d/w pt.  Due for shingrix and flu when possible.   She is going to call about mammogram when possible.   DXA 2021 Advance directive- d/w pt. Has a living will. Husband, then patient's sister Wynn Banker, then her niece Magdalene Patricia designated in that order should patient be incapacitated. She doesn't want her son designated to make decisions.   Colonoscopy 2020

## 2020-07-02 NOTE — Assessment & Plan Note (Signed)
3 warty lesions on the right third finger, frozen x3 without complication with routine post cryotherapy instructions given to patient.  She understood.  We can retreat in the future if needed.

## 2020-07-02 NOTE — Assessment & Plan Note (Signed)
Levothyroxine 15mcg daily.  Per hematology clinic.  I'll defer, she agrees.

## 2020-07-02 NOTE — Assessment & Plan Note (Signed)
Resolved now.  She will update me if she has return of symptoms.  No change in medications at this point.

## 2020-08-05 ENCOUNTER — Ambulatory Visit
Admission: RE | Admit: 2020-08-05 | Discharge: 2020-08-05 | Disposition: A | Discharge status: Home / Self Care | Payer: PPO | Source: Ambulatory Visit | Attending: Family Medicine | Admitting: Family Medicine

## 2020-08-05 ENCOUNTER — Other Ambulatory Visit: Payer: Self-pay

## 2020-08-05 DIAGNOSIS — Z1231 Encounter for screening mammogram for malignant neoplasm of breast: Secondary | ICD-10-CM | POA: Diagnosis not present

## 2020-08-25 DIAGNOSIS — Z961 Presence of intraocular lens: Secondary | ICD-10-CM | POA: Diagnosis not present

## 2020-10-08 ENCOUNTER — Other Ambulatory Visit: Payer: Self-pay | Admitting: Internal Medicine

## 2020-10-08 DIAGNOSIS — C3431 Malignant neoplasm of lower lobe, right bronchus or lung: Secondary | ICD-10-CM

## 2020-10-08 DIAGNOSIS — C73 Malignant neoplasm of thyroid gland: Secondary | ICD-10-CM

## 2020-10-09 ENCOUNTER — Other Ambulatory Visit: Payer: Self-pay | Admitting: *Deleted

## 2020-10-09 DIAGNOSIS — C3431 Malignant neoplasm of lower lobe, right bronchus or lung: Secondary | ICD-10-CM

## 2020-10-09 DIAGNOSIS — C73 Malignant neoplasm of thyroid gland: Secondary | ICD-10-CM

## 2020-10-20 ENCOUNTER — Encounter: Payer: Self-pay | Admitting: Family Medicine

## 2020-10-20 ENCOUNTER — Ambulatory Visit (INDEPENDENT_AMBULATORY_CARE_PROVIDER_SITE_OTHER): Payer: PPO | Admitting: Family Medicine

## 2020-10-20 ENCOUNTER — Other Ambulatory Visit: Payer: Self-pay

## 2020-10-20 DIAGNOSIS — Z659 Problem related to unspecified psychosocial circumstances: Secondary | ICD-10-CM | POA: Diagnosis not present

## 2020-10-20 NOTE — Patient Instructions (Signed)
Check your BP at home at rest in the AM after urinating and resting for a few minutes.   Take care.  Glad to see you. Update me as needed.  I will be thinking about you and your husband.

## 2020-10-20 NOTE — Progress Notes (Signed)
This visit occurred during the SARS-CoV-2 public health emergency.  Safety protocols were in place, including screening questions prior to the visit, additional usage of staff PPE, and extensive cleaning of exam room while observing appropriate contact time as indicated for disinfecting solutions.  BP concern.    Her son died recently (he was jaundiced, had black stools, confused, and then died at home; history of alcohol use). Condolences offered.  She has been looking after her husband in the meantime.  She has insomnia, ie trouble with waking.  Tylenol helped with HA.  We talked about grief counseling.     No CP, not SOB.    She is planning her son's funeral, which should happen next week.  Meds, vitals, and allergies reviewed.   ROS: Per HPI unless specifically indicated in ROS section   GEN: nad, alert and oriented HEENT: ncat NECK: supple w/o LA CV: rrr.  PULM: ctab, no inc wob ABD: soft, +bs EXT: no edema SKIN: Well-perfused.

## 2020-10-21 ENCOUNTER — Encounter: Payer: Self-pay | Admitting: Family Medicine

## 2020-10-21 DIAGNOSIS — Z659 Problem related to unspecified psychosocial circumstances: Secondary | ICD-10-CM | POA: Insufficient documentation

## 2020-10-21 NOTE — Assessment & Plan Note (Addendum)
I think the primary issue is all of the upheaval related to her son's illness and death, condolences offered.  Her blood pressure is reasonable today and I do not want to induce hypotension.  I do not think it makes sense to change her medications at this point but grief counseling is reasonable and she will consider that.  She will update me as needed.  I thanked her for her effort.  She can check her blood pressure at home if needed, see after visit summary.

## 2020-11-13 ENCOUNTER — Inpatient Hospital Stay: Payer: PPO | Attending: Internal Medicine

## 2020-11-13 ENCOUNTER — Telehealth: Payer: Self-pay | Admitting: Internal Medicine

## 2020-11-13 ENCOUNTER — Other Ambulatory Visit: Payer: Self-pay

## 2020-11-13 DIAGNOSIS — Z8249 Family history of ischemic heart disease and other diseases of the circulatory system: Secondary | ICD-10-CM | POA: Diagnosis not present

## 2020-11-13 DIAGNOSIS — Z90721 Acquired absence of ovaries, unilateral: Secondary | ICD-10-CM | POA: Insufficient documentation

## 2020-11-13 DIAGNOSIS — Z90710 Acquired absence of both cervix and uterus: Secondary | ICD-10-CM | POA: Diagnosis not present

## 2020-11-13 DIAGNOSIS — C3431 Malignant neoplasm of lower lobe, right bronchus or lung: Secondary | ICD-10-CM

## 2020-11-13 DIAGNOSIS — C73 Malignant neoplasm of thyroid gland: Secondary | ICD-10-CM | POA: Insufficient documentation

## 2020-11-13 DIAGNOSIS — Z823 Family history of stroke: Secondary | ICD-10-CM | POA: Insufficient documentation

## 2020-11-13 DIAGNOSIS — Z9049 Acquired absence of other specified parts of digestive tract: Secondary | ICD-10-CM | POA: Diagnosis not present

## 2020-11-13 DIAGNOSIS — Z8041 Family history of malignant neoplasm of ovary: Secondary | ICD-10-CM | POA: Insufficient documentation

## 2020-11-13 DIAGNOSIS — Z79899 Other long term (current) drug therapy: Secondary | ICD-10-CM | POA: Insufficient documentation

## 2020-11-13 DIAGNOSIS — R5383 Other fatigue: Secondary | ICD-10-CM | POA: Insufficient documentation

## 2020-11-13 DIAGNOSIS — Z79890 Hormone replacement therapy (postmenopausal): Secondary | ICD-10-CM | POA: Diagnosis not present

## 2020-11-13 DIAGNOSIS — M549 Dorsalgia, unspecified: Secondary | ICD-10-CM | POA: Insufficient documentation

## 2020-11-13 DIAGNOSIS — M8580 Other specified disorders of bone density and structure, unspecified site: Secondary | ICD-10-CM | POA: Insufficient documentation

## 2020-11-13 DIAGNOSIS — Z881 Allergy status to other antibiotic agents status: Secondary | ICD-10-CM | POA: Diagnosis not present

## 2020-11-13 DIAGNOSIS — Z882 Allergy status to sulfonamides status: Secondary | ICD-10-CM | POA: Insufficient documentation

## 2020-11-13 DIAGNOSIS — E785 Hyperlipidemia, unspecified: Secondary | ICD-10-CM | POA: Insufficient documentation

## 2020-11-13 LAB — COMPREHENSIVE METABOLIC PANEL
ALT: 34 U/L (ref 0–44)
AST: 25 U/L (ref 15–41)
Albumin: 4 g/dL (ref 3.5–5.0)
Alkaline Phosphatase: 48 U/L (ref 38–126)
Anion gap: 6 (ref 5–15)
BUN: 18 mg/dL (ref 8–23)
CO2: 30 mmol/L (ref 22–32)
Calcium: 9.1 mg/dL (ref 8.9–10.3)
Chloride: 102 mmol/L (ref 98–111)
Creatinine, Ser: 0.65 mg/dL (ref 0.44–1.00)
GFR, Estimated: >60 mL/min (ref >60)
Glucose, Bld: 108 mg/dL — ABNORMAL HIGH (ref 70–99)
Potassium: 4.3 mmol/L (ref 3.5–5.1)
Sodium: 138 mmol/L (ref 135–145)
Total Bilirubin: 0.7 mg/dL (ref 0.3–1.2)
Total Protein: 6.4 g/dL — ABNORMAL LOW (ref 6.5–8.1)

## 2020-11-13 LAB — CBC WITH DIFFERENTIAL/PLATELET
Abs Immature Granulocytes: 0.03 10*3/uL (ref 0.00–0.07)
Basophils Absolute: 0 10*3/uL (ref 0.0–0.1)
Basophils Relative: 1 %
Eosinophils Absolute: 0.1 10*3/uL (ref 0.0–0.5)
Eosinophils Relative: 1 %
HCT: 38.3 % (ref 36.0–46.0)
Hemoglobin: 12.5 g/dL (ref 12.0–15.0)
Immature Granulocytes: 1 %
Lymphocytes Relative: 19 %
Lymphs Abs: 1.2 10*3/uL (ref 0.7–4.0)
MCH: 30.6 pg (ref 26.0–34.0)
MCHC: 32.6 g/dL (ref 30.0–36.0)
MCV: 93.9 fL (ref 80.0–100.0)
Monocytes Absolute: 0.4 10*3/uL (ref 0.1–1.0)
Monocytes Relative: 7 %
Neutro Abs: 4.6 10*3/uL (ref 1.7–7.7)
Neutrophils Relative %: 71 %
Platelets: 137 10*3/uL — ABNORMAL LOW (ref 150–400)
RBC: 4.08 MIL/uL (ref 3.87–5.11)
RDW: 14.2 % (ref 11.5–15.5)
WBC: 6.4 10*3/uL (ref 4.0–10.5)
nRBC: 0 % (ref 0.0–0.2)

## 2020-11-13 NOTE — Telephone Encounter (Signed)
Called patient to request to change her 1/21 appt to virtual per MD request due to surge in [place]. Patient expressed she is not interested in a virtual visit and would like to keep her appointment in person.

## 2020-11-14 LAB — THYROID PANEL WITH TSH
Free Thyroxine Index: 2.6 (ref 1.2–4.9)
T3 Uptake Ratio: 30 % (ref 24–39)
T4, Total: 8.7 ug/dL (ref 4.5–12.0)
TSH: 0.607 u[IU]/mL (ref 0.450–4.500)

## 2020-11-18 ENCOUNTER — Ambulatory Visit
Admission: RE | Admit: 2020-11-18 | Discharge: 2020-11-18 | Disposition: A | Discharge status: Home / Self Care | Payer: PPO | Source: Ambulatory Visit | Attending: Internal Medicine | Admitting: Internal Medicine

## 2020-11-18 ENCOUNTER — Other Ambulatory Visit: Payer: Self-pay

## 2020-11-18 DIAGNOSIS — R911 Solitary pulmonary nodule: Secondary | ICD-10-CM | POA: Diagnosis not present

## 2020-11-18 DIAGNOSIS — C3431 Malignant neoplasm of lower lobe, right bronchus or lung: Secondary | ICD-10-CM | POA: Insufficient documentation

## 2020-11-18 DIAGNOSIS — J984 Other disorders of lung: Secondary | ICD-10-CM | POA: Diagnosis not present

## 2020-11-18 DIAGNOSIS — C3491 Malignant neoplasm of unspecified part of right bronchus or lung: Secondary | ICD-10-CM | POA: Diagnosis not present

## 2020-11-18 DIAGNOSIS — I700 Atherosclerosis of aorta: Secondary | ICD-10-CM | POA: Diagnosis not present

## 2020-11-19 ENCOUNTER — Other Ambulatory Visit: Payer: Self-pay | Admitting: Family Medicine

## 2020-11-19 NOTE — Telephone Encounter (Signed)
Pharmacy requests refill on: Omeprazole 40 mg   LAST REFILL: 05/28/2020 (Q-90, R-3) LAST OV: 10/20/2020 NEXT OV: Not Scheduled  PHARMACY: [id number] [place], [place]

## 2020-11-20 ENCOUNTER — Encounter: Payer: Self-pay | Admitting: Internal Medicine

## 2020-11-20 ENCOUNTER — Inpatient Hospital Stay: Payer: PPO | Admitting: Internal Medicine

## 2020-11-20 ENCOUNTER — Other Ambulatory Visit: Payer: Self-pay

## 2020-11-20 VITALS — BP 122/63 | HR 79 | Temp 97.5°F | Resp 16 | Ht 64.0 in | Wt 124.0 lb

## 2020-11-20 DIAGNOSIS — C3431 Malignant neoplasm of lower lobe, right bronchus or lung: Secondary | ICD-10-CM

## 2020-11-20 NOTE — Progress Notes (Signed)
[facility] OFFICE PROGRESS NOTE  Patient Care Team: Tonia Ghent, MD as PCP - General (Family Medicine) Kate Sable, MD as PCP - Cardiology (Cardiology) Beverly Gust, MD as Consulting Physician (Otolaryngology) Nestor Lewandowsky, MD as Consulting Physician (Cardiothoracic Surgery) Cammie Sickle, MD as Consulting Physician (Internal Medicine) Birder Robson, MD as Consulting Physician (Ophthalmology) Clayburn Pert, MD as Consulting Physician (General Surgery)  Cancer Staging Thyroid cancer [facility]) Staging form: Thyroid - Papillary or Follicular (Under 45 years), AJCC 7th Edition - Clinical: Stage I (T1, N0, M0) - Signed by Forest Gleason, MD on 09/11/2015    Oncology History Overview Note  # NOV 2016- RLL [place] ca T1N0 [incidental; Dr.Simonds; Bronc- s/p RLlobectomy; Dr.Oaks;Dec 2016]  # FEB 2017- 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 STAGE I; s/p Total Thyroidetcomy [Dr.Chapman];NO RAIU.  LOW RISK; but detectable thyroglobulin- on Synthroid  #Survivorship-pending  DIAGNOSIS: Lung cancer -stage I #thyroid cancer low risk  GOALS: Cure  CURRENT/MOST RECENT THERAPY: Surveillance    Thyroid cancer ([name])  Cancer of lower lobe of right lung ([name])  05/06/2016 Initial Diagnosis   Malignant neoplasm of lower lobe, right bronchus or lung (HCC)    INTERVAL HISTORY:  [name] 75 y.o.  female pleasant patient above history of stage I lung cancer and also stage I thyroid cancer currently on surveillance is here for follow-up/review results of CT scan.  Patient unfortunately lost her son to alcoholism/liver disease.  patient's son was at home/went to hospice.  However she seems to be coping fairly well.  Patient denies any bone pain.  Denies any headache.  Any nausea vomiting.  Colitis.  No hot flashes.  No lumps or bumps in the neck.   Review of Systems  Constitutional:  Positive for malaise/fatigue. Negative for chills, diaphoresis, fever and weight loss.  HENT: Negative for nosebleeds and sore throat.   Eyes: Negative for double vision.  Respiratory: Negative for cough, hemoptysis, sputum production, shortness of breath and wheezing.   Cardiovascular: Negative for chest pain, palpitations, orthopnea and leg swelling.  Gastrointestinal: Negative for abdominal pain, blood in stool, constipation, diarrhea, heartburn, melena and vomiting.  Genitourinary: Negative for dysuria, frequency and urgency.  Musculoskeletal: Positive for back pain. Negative for joint pain.  Skin: Negative.  Negative for itching and rash.  Neurological: Positive for tingling. Negative for dizziness, focal weakness, weakness and headaches.  Endo/Heme/Allergies: Does not bruise/bleed easily.  Psychiatric/Behavioral: Negative for depression. The patient is not nervous/anxious and does not have insomnia.       PAST MEDICAL HISTORY :  Past Medical History:  Diagnosis Date  . Adenocarcinoma of right lung ([place]) 10/01/2015  . GERD (gastroesophageal reflux disease)   . GIB (gastrointestinal bleeding)   . Groin pain    Along superior/laterl R inguinal canal.  Could be due to hernia or old scar tissue.  prev with CT done w/o alarming findings.  Will treat episodically.  Use tylenol #3 prn.    . Hyperlipidemia   . Lipoma of thigh    Left  . Normal cardiac stress test 2014  . PONV (postoperative nausea and vomiting)    nausea after hysterectomy, and 2 days after lung surgery 10/2015  . Thyroid cancer ([name]) 12/2015  . Thyroid nodule     PAST SURGICAL HISTORY :   Past Surgical History:  Procedure Laterality Date  . ABDOMINAL HYSTERECTOMY    . BREAST EXCISIONAL BIOPSY Right 1977  . BREAST MASS  EXCISION  1977   benign  . CARPAL TUNNEL RELEASE Right 1978  . CATARACT EXTRACTION, BILATERAL Bilateral   . CHOLECYSTECTOMY  11/23/2016   Procedure: LAPAROSCOPIC CHOLECYSTECTOMY;  Surgeon: Clayburn Pert, MD;  Location: [facility];  Service: General;;  . COLONOSCOPY  2009  . COLONOSCOPY WITH PROPOFOL N/A 05/18/2019   Procedure: COLONOSCOPY WITH PROPOFOL;  Surgeon: Virgel Manifold, MD;  Location: [facility];  Service: Endoscopy;  Laterality: N/A;  . COLONOSCOPY WITH PROPOFOL N/A 08/15/2019   Procedure: COLONOSCOPY WITH PROPOFOL;  Surgeon: Virgel Manifold, MD;  Location: [facility];  Service: Endoscopy;  Laterality: N/A;  . DILATION AND CURETTAGE OF UTERUS  2001  . ELECTROMAGNETIC NAVIGATION BROCHOSCOPY N/A 08/25/2015   Procedure: ELECTROMAGNETIC NAVIGATION BRONCHOSCOPY;  Surgeon: Flora Lipps, MD;  Location: [facility];  Service: Cardiopulmonary;  Laterality: N/A;  . ENDOBRONCHIAL ULTRASOUND N/A 08/25/2015   Procedure: ENDOBRONCHIAL ULTRASOUND;  Surgeon: Flora Lipps, MD;  Location: [facility];  Service: Cardiopulmonary;  Laterality: N/A;  . ESOPHAGOGASTRODUODENOSCOPY  2002, 11/15/07   Normal (Dr. Allyn Kenner)  . ESOPHAGOGASTRODUODENOSCOPY (EGD) WITH PROPOFOL N/A 05/18/2019   Procedure: ESOPHAGOGASTRODUODENOSCOPY (EGD) WITH PROPOFOL;  Surgeon: Virgel Manifold, MD;  Location: [facility];  Service: Endoscopy;  Laterality: N/A;  . ESOPHAGOGASTRODUODENOSCOPY (EGD) WITH PROPOFOL N/A 08/15/2019   Procedure: ESOPHAGOGASTRODUODENOSCOPY (EGD) WITH PROPOFOL;  Surgeon: Virgel Manifold, MD;  Location: [facility];  Service: Endoscopy;  Laterality: N/A;  . FACIAL COSMETIC SURGERY  01/13/10   mini facelift  . HERNIA REPAIR  1976  . OOPHORECTOMY    . THORACOTOMY/LOBECTOMY Right 10/27/2015   Procedure: THORACOTOMY/LOBECTOMY;  Surgeon: Nestor Lewandowsky, MD;  Location: [facility];  Service: Thoracic;  Laterality: Right;  . THYROIDECTOMY N/A 12/29/2015   Procedure: THYROIDECTOMY;  Surgeon: Beverly Gust, MD;  Location: [facility];  Service: ENT;  Laterality: N/A;  . TOTAL VAGINAL HYSTERECTOMY  12/30/08   for fibroid pain (Dr. Gertie Fey)    FAMILY HISTORY :   Family History  Problem Relation  Age of Onset  . Transient ischemic attack Mother   . Hypertension Mother   . Stroke Mother        mini strokes  . Hypertension Sister   . Cancer Sister        ovarian  . Hypertension Sister   . Hypertension Sister   . Hypertension Sister   . Breast cancer Neg Hx   . Colon cancer Neg Hx     SOCIAL HISTORY:   Social History   Tobacco Use  . Smoking status: Never Smoker  . Smokeless tobacco: Never Used  Vaping Use  . Vaping Use: Never used  Substance Use Topics  . Alcohol use: No    Alcohol/week: 0.0 standard drinks  . Drug use: No    ALLERGIES:  is allergic to atorvastatin, ciprofloxacin, epinephrine, metoprolol succinate, simvastatin, and sulfa antibiotics.  MEDICATIONS:  Current Outpatient Medications  Medication Sig Dispense Refill  . Ascorbic Acid (VITAMIN C) 500 MG tablet Take 2,000 mg by mouth daily. Reported on 02/04/2016    . aspirin EC 81 MG tablet Take 1 tablet (81 mg total) by mouth daily.    . Biotin 1000 MCG tablet Take 1,000 mcg by mouth daily. Reported on 02/04/2016    . CALCIUM-MAG-VIT C-VIT D PO Take by mouth daily.    . Cholecalciferol (VITAMIN D-3) 1000 units CAPS Take 1 capsule by mouth daily with supper.     . Coenzyme Q10 200 MG capsule Take 200 mg by mouth daily.    Marland Kitchen  Folic Acid-Vit X4-JOI N86 (FA-VITAMIN B-6-VITAMIN B-12 PO) Take 1 tablet by mouth daily after breakfast. Reported on 02/04/2016    . levothyroxine (SYNTHROID) 88 MCG tablet TAKE 1 TABLET (88 MCG TOTAL) BY MOUTH DAILY BEFORE BREAKFAST. 90 tablet 3  . Lutein 20 MG CAPS Take 20 mg by mouth daily.    . Omega-3 Fatty Acids (SUPER OMEGA 3 EPA/DHA) 1000 MG CAPS Take 1,000 mg by mouth daily with supper.    Marland Kitchen omeprazole (PRILOSEC) 40 MG capsule TAKE 1 CAPSULE BY MOUTH EVERY DAY 90 capsule 1  . Potassium Gluconate 595 MG CAPS Take 595 mg by mouth daily.     . pravastatin (PRAVACHOL) 40 MG tablet TAKE 1 TABLET BY MOUTH EVERY DAY 90 tablet 3   No current facility-administered medications for this  visit.    PHYSICAL EXAMINATION: ECOG PERFORMANCE STATUS: 1 - Symptomatic but completely ambulatory  BP 122/63 (BP Location: Left Arm, Patient Position: Sitting, Cuff Size: Normal)   Pulse 79   Temp (!) 97.5 F (36.4 C) (Tympanic)   Resp 16   Ht 5\' 4"  (1.626 m)   Wt 124 lb (56.2 kg)   SpO2 100%   BMI 21.28 kg/m   Filed Weights   11/20/20 1028  Weight: 124 lb (56.2 kg)    Physical Exam HENT:     Head: Normocephalic and atraumatic.     Mouth/Throat:     Pharynx: No oropharyngeal exudate.  Eyes:     Pupils: Pupils are equal, round, and reactive to light.  Cardiovascular:     Rate and Rhythm: Normal rate and regular rhythm.  Pulmonary:     Effort: No respiratory distress.     Breath sounds: No wheezing.  Abdominal:     General: Bowel sounds are normal. There is no distension.     Palpations: Abdomen is soft. There is no mass.     Tenderness: There is no abdominal tenderness. There is no guarding or rebound.  Musculoskeletal:        General: No tenderness. Normal range of motion.     Cervical back: Normal range of motion and neck supple.  Skin:    General: Skin is warm.  Neurological:     Mental Status: She is alert and oriented to person, place, and time.  Psychiatric:        Mood and Affect: Affect normal.    LABORATORY DATA:  I have reviewed the data as listed    Component Value Date/Time   NA 138 11/13/2020 1055   NA 141 07/01/2013 0103   K 4.3 11/13/2020 1055   K 3.8 07/01/2013 0103   CL 102 11/13/2020 1055   CL 110 (H) 07/01/2013 0103   CO2 30 11/13/2020 1055   CO2 27 07/01/2013 0103   GLUCOSE 108 (H) 11/13/2020 1055   GLUCOSE 88 07/01/2013 0103   BUN 18 11/13/2020 1055   BUN 11 07/01/2013 0103   CREATININE 0.65 11/13/2020 1055   CREATININE 0.68 07/01/2013 0103   CALCIUM 9.1 11/13/2020 1055   CALCIUM 8.2 (L) 07/01/2013 0103   PROT 6.4 (L) 11/13/2020 1055   PROT 5.5 (L) 07/01/2013 0103   ALBUMIN 4.0 11/13/2020 1055   ALBUMIN 3.0 (L) 07/01/2013  0103   AST 25 11/13/2020 1055   AST 19 07/01/2013 0103   ALT 34 11/13/2020 1055   ALT 24 07/01/2013 0103   ALKPHOS 48 11/13/2020 1055   ALKPHOS 55 07/01/2013 0103   BILITOT 0.7 11/13/2020 1055   BILITOT 0.7 07/01/2013 0103  GFRNONAA >60 11/13/2020 1055   GFRNONAA >60 07/01/2013 0103   GFRAA >60 05/19/2019 0117   GFRAA >60 07/01/2013 0103    No results found for: SPEP, UPEP  Lab Results  Component Value Date   WBC 6.4 11/13/2020   NEUTROABS 4.6 11/13/2020   HGB 12.5 11/13/2020   HCT 38.3 11/13/2020   MCV 93.9 11/13/2020   PLT 137 (L) 11/13/2020      Chemistry      Component Value Date/Time   NA 138 11/13/2020 1055   NA 141 07/01/2013 0103   K 4.3 11/13/2020 1055   K 3.8 07/01/2013 0103   CL 102 11/13/2020 1055   CL 110 (H) 07/01/2013 0103   CO2 30 11/13/2020 1055   CO2 27 07/01/2013 0103   BUN 18 11/13/2020 1055   BUN 11 07/01/2013 0103   CREATININE 0.65 11/13/2020 1055   CREATININE 0.68 07/01/2013 0103      Component Value Date/Time   CALCIUM 9.1 11/13/2020 1055   CALCIUM 8.2 (L) 07/01/2013 0103   ALKPHOS 48 11/13/2020 1055   ALKPHOS 55 07/01/2013 0103   AST 25 11/13/2020 1055   AST 19 07/01/2013 0103   ALT 34 11/13/2020 1055   ALT 24 07/01/2013 0103   BILITOT 0.7 11/13/2020 1055   BILITOT 0.7 07/01/2013 0103       RADIOGRAPHIC STUDIES: I have personally reviewed the radiological images as listed and agreed with the findings in the report. No results found.   ASSESSMENT & PLAN:  Cancer of lower lobe of right lung ([place]) # RLL Lung ca; Stage I;  no adjuvant therapy. JAN 17th, 2022--no obvious evidence of recurrence; however right upper lobe groundglass opacity[see discussion below]. STABLE.   # RUL- Groundglass opacity-10 mm/3 mm solid nodule-question low-grade malignancy versus others- STABLE; Recommend follow up imaging in 6 months.   # Thyroid cancer status post thyroidectomy low risk stage I; on Synthroid 88 mcg.  January 2022 -TSH- 0.6;  detectable thyroglobulin-pending today. STABLE.   # Jan 2021-  BMD- Osteopenia- T score=- 2.0; improved from 4 years ago; ca+vit D BID; STABLE.      DISPOSITION: will mychart- results # follow up in 6 months-MD [[facility]/ thyroid profile/ Thyroiglobulin/and thyroglobulin antibodies- 1 week prior]; CT chest prior- Dr.B  # I reviewed the blood work- with the patient in detail; also reviewed the imaging independently [as summarized above]; and with the patient in detail.    Orders Placed This Encounter  Procedures  . CT Chest W Contrast    Standing Status:   Future    Standing Expiration Date:   11/20/2021    Order Specific Question:   If indicated for the ordered procedure, I authorize the administration of contrast media per Radiology protocol    Answer:   Yes    Order Specific Question:   Preferred imaging location?    Answer:   [facility]  . CBC with Differential/Platelet    Standing Status:   Future    Standing Expiration Date:   11/20/2021  . Comprehensive metabolic panel    Standing Status:   Future    Standing Expiration Date:   11/20/2021  . Thyroid Panel With TSH    Standing Status:   Future    Standing Expiration Date:   11/20/2021  . TgAb+Thyroglobulin IMA or RIA    Standing Status:   Future    Standing Expiration Date:   11/20/2021   All questions were answered. The patient knows to call the clinic  with any problems, questions or concerns.      Cammie Sickle, MD 11/20/2020 4:50 PM

## 2020-11-20 NOTE — Assessment & Plan Note (Addendum)
#   RLL Lung ca; Stage I;  no adjuvant therapy. JAN 17th, 2022--no obvious evidence of recurrence; however right upper lobe groundglass opacity[see discussion below]. STABLE.   # RUL- Groundglass opacity-10 mm/3 mm solid nodule-question low-grade malignancy versus others- STABLE; Recommend follow up imaging in 6 months.   # Thyroid cancer status post thyroidectomy low risk stage I; on Synthroid 88 mcg.  January 2022 -TSH- 0.6; detectable thyroglobulin-pending today. STABLE.   # Jan 2021-  BMD- Osteopenia- T score=- 2.0; improved from 4 years ago; ca+vit D BID; STABLE.      DISPOSITION: will mychart- results # follow up in 6 months-MD [[facility]/ thyroid profile/ Thyroiglobulin/and thyroglobulin antibodies- 1 week prior]; CT chest prior- Dr.B  # I reviewed the blood work- with the patient in detail; also reviewed the imaging independently [as summarized above]; and with the patient in detail.

## 2020-11-25 LAB — THYROGLOBULIN BY RIA: Thyroglobulin by RIA: 8.1 ng/mL

## 2020-11-25 LAB — TGAB+THYROGLOBULIN IMA OR RIA: Thyroglobulin Antibody: 15 IU/mL — ABNORMAL HIGH (ref 0.0–0.9)

## 2020-11-27 ENCOUNTER — Telehealth: Payer: Self-pay | Admitting: *Deleted

## 2020-11-27 NOTE — Telephone Encounter (Signed)
Patient called stating that one of her tests was not back when she spoke with Dr B and that he said he would call her when it came back. She states that it is back in her My Chart and she does not know what it means and would like a return call please  TgAb+Thyroglobulin IMA or RIA Order: [id number]  Status: Final result   Visible to patient: Yes (seen)   Next appt: 05/13/2021 at 11:30 AM in Oncology ([facility])   Dx: Thyroid cancer ([place]); Cancer of lower...   0 Result Notes   Ref Range & Units 2 wk ago  Thyroglobulin Antibody 0.0 - 0.9 IU/mL 15.0High   Comment: (NOTE)  Thyroglobulin Antibody measured by [facility] Methodology  Performed At: [facility]  [place], [place] [id number]  Rush Farmer MD [id number]   Resulting Agency  [facility]         Specimen Collected: 11/13/20 10:55 Last Resulted: 11/25/20 12:36     Lab Flowsheet   Order Details   View Encounter   Lab and Collection Details   Routing   Result History        Result Care Coordination   Patient Communication  Add Comments Seen Back to Top        Other Results from 11/13/2020   Thyroid Panel With TSH Order: [id number]  Status: Final result   Visible to patient: Yes (seen)   Next appt: 05/13/2021 at 11:30 AM in Oncology ([facility])   Dx: Thyroid cancer ([place]); Cancer of lower...   0 Result Notes  Component Ref Range & Units 2 wk ago  (11/13/20) 1 yr ago  (05/17/19) 3 yr ago  (03/20/17) 4 yr ago  (11/11/16) 4 yr ago  (08/11/16) 4 yr ago  (05/20/16) 4 yr ago  (05/20/16)  TSH 0.450 - 4.500 uIU/mL 0.607  1.261 R, CM  0.266Low R, CM  0.092Low  0.124Low   0.26Low R   T4, Total 4.5 - 12.0 ug/dL 8.7    8.3  9.3  10.3    T3 Uptake Ratio 24 - 39 % 30    32  33     Free Thyroxine Index 1.2 - 4.9 2.6    2.7 CM  3.1 CM     Comment: (NOTE)  Performed At: [facility]  [address] [place], [place] [id number]  Rush Farmer MD  [id number]   Resulting Agency  [place] [facility] [place] [facility] [place] [facility] [place] [facility] [place] [facility] [place]         Specimen Collected: 11/13/20 10:55 Last Resulted: 11/14/20 08:37     Lab Flowsheet   Order Details   View Encounter   Lab and Collection Details   Routing   Result History     CM=Additional commentsR=Reference range differs from displayed range     Result Care Coordination   Patient Communication  Add Comments Seen Back to Top         Contains abnormal dataComprehensive metabolic panel Order: [id number]  Status: Final result    Visible to patient: Yes (seen)    Next appt: 05/13/2021 at 11:30 AM in Oncology ([facility])    Dx: Thyroid cancer ([place]); Cancer of lower...    0 Result Notes   Component Ref Range & Units 2 wk ago  (11/13/20) 1 yr ago  (06/24/19) 1 yr ago  (05/27/19) 1 yr ago  (05/19/19) 1 yr ago  (05/17/19) 1 yr  ago  (05/16/19) 1 yr ago  (01/05/19)  Sodium 135 - 145 mmol/L 138  140 R  140 R  140  142  138  140   Potassium 3.5 - 5.1 mmol/L 4.3  4.7 R  4.2 R  3.6  3.9  4.0  4.1   Chloride 98 - 111 mmol/L 102  105 R  104 R  111  111  103  104   CO2 22 - 32 mmol/L 30  28 R  29 R  24  24  28  28    Glucose, Bld 70 - 99 mg/dL 108High  97  111High  90  116High  151High  132High   Comment: Glucose reference range applies only to samples taken after fasting for at least 8 hours.  BUN 8 - 23 mg/dL 18  16 R  17 R  11  18  20  18    Creatinine, Ser 0.44 - 1.00 mg/dL 0.65  0.72 R  0.71 R  0.50  0.52  0.81  0.68   Calcium 8.9 - 10.3 mg/dL 9.1  9.2 R  9.1 R  7.7Low  7.9Low  9.0  9.0   Total Protein 6.5 - 8.1 g/dL 6.4Low  6.7 R     6.6    Albumin 3.5 - 5.0 g/dL 4.0  4.6 R     4.1    AST 15 - 41 U/L 25  18 R     19    ALT 0 - 44 U/L 34  14 R     21    Alkaline Phosphatase 38 - 126 U/L 48  51 R     57    Total Bilirubin 0.3 - 1.2 mg/dL 0.7  0.6 R     0.7    GFR, Estimated >60 mL/min >60         Comment: (NOTE)   Calculated using the CKD-EPI Creatinine Equation (2021)   Anion gap 5 - 15 6    5  CM  7 CM  7 CM  8 CM   Comment: Performed at [facility], [address] [place], [place] [address]  Resulting Agency  [facility] [place] [facility] [place] [facility] [place] [facility] [place] [facility]          Specimen Collected: 11/13/20 10:55 Last Resulted: 11/13/20 11:25     Lab Flowsheet    Order Details    View Encounter    Lab and Collection Details    Routing    Result History      CM=Additional commentsR=Reference range differs from displayed range     Result Care Coordination   Patient Communication  Add Comments Seen Back to Top          Contains abnormal dataCBC with Differential Order: [id number]  Status: Final result    Visible to patient: Yes (seen)    Next appt: 05/13/2021 at 11:30 AM in Oncology ([facility])    Dx: Thyroid cancer ([place]); Cancer of lower...    0 Result Notes   Component Ref Range & Units 2 wk ago  (11/13/20) 1 yr ago  (06/24/19) 1 yr ago  (05/27/19) 1 yr ago  (05/19/19) 1 yr ago  (05/18/19) 1 yr ago  (05/18/19) 1 yr ago  (05/18/19)  WBC 4.0 - 10.5 K/uL 6.4  6.3  6.0       RBC 3.87 - 5.11 MIL/uL 4.08  4.60 R  4.01 R  Hemoglobin 12.0 - 15.0 g/dL 12.5  13.9  12.3  9.7Low  10.9Low  11.3Low  11.7Low   HCT 36.0 - 46.0 % 38.3  42.2  36.8  30.3Low CM  33.8Low CM  35.2Low CM  35.8Low CM   MCV 80.0 - 100.0 fL 93.9  91.6 R  91.6 R       MCH 26.0 - 34.0 pg 30.6         MCHC 30.0 - 36.0 g/dL 32.6  32.9  33.6       RDW 11.5 - 15.5 % 14.2  15.0  14.6       Platelets 150 - 400 K/uL 137Low  122.0 Repeated and verified X2.Low R, CM  171.0 R       Comment: SPECIMEN CHECKED FOR CLOTS  nRBC 0.0 - 0.2 % 0.0         Neutrophils Relative % % 71  64.2 R  67.3 R       Neutro Abs 1.7 - 7.7 K/uL 4.6  4.1 R  4.1 R       Lymphocytes Relative % 19  27.0 R  24.1 R       Lymphs Abs 0.7 - 4.0 K/uL 1.2  1.7  1.5        Monocytes Relative % 7  6.5 R  6.7 R       Monocytes Absolute 0.1 - 1.0 K/uL 0.4  0.4  0.4       Eosinophils Relative % 1  1.3 R  1.1 R       Eosinophils Absolute 0.0 - 0.5 K/uL 0.1  0.1 R  0.1 R       Basophils Relative % 1  1.0 R  0.8 R       Basophils Absolute 0.0 - 0.1 K/uL 0.0  0.1  0.0       Immature Granulocytes % 1         Abs Immature Granulocytes 0.00 - 0.07 K/uL 0.03         Comment: Performed at [facility], [address] [place], [place] [address]  Resulting Agency  [facility] [place] [facility] [place] [facility] [place] [facility] [place] [facility]          Specimen Collected: 11/13/20 10:55 Last Resulted: 11/13/20 12:01     Lab Flowsheet    Order Details    View Encounter    Lab and Collection Details    Routing    Result History      CM=Additional commentsR=Reference range differs from displayed range     Result Care Coordination   Patient Communication  Add Comments Seen Back to Top          Thyroglobulin by RIA Order: [id number]  Status: Final result    Visible to patient: Yes (not seen)    Next appt: 05/13/2021 at 11:30 AM in Oncology ([facility])    0 Result Notes    Ref Range & Units 2 wk ago  Thyroglobulin by RIA ng/mL 8.1   Comment: (NOTE)  This test was developed and its performance characteristics  determined by [facility]. It has not been cleared or approved  by the Food and Drug Administration.  Reference Range:  Pubertal Children  and Adults: <40  According to the [facility] of Clinical Biochemistry,  the reference interval for Thyroglobulin (TG) should be  related to euthyroid patients and not for patients who  underwent thyroidectomy. TG  reference intervals for these  patients depend on the residual mass of the thyroid tissue  left after surgery. Establishing a post-operative baseline  is recommended. The assay quantitation limit is 2.0 ng/mL.  Performed At: [facility]  [place], [place] [id number]  Martha Clan MD [id number]   Resulting Agency  [facility]          Specimen Collected: 11/13/20 10:55 Last Resulted: 11/25/20 12:36

## 2020-11-27 NOTE — Telephone Encounter (Signed)
Dr. Jacinto Reap will review chart and call patient.

## 2020-11-30 ENCOUNTER — Telehealth: Payer: Self-pay | Admitting: Internal Medicine

## 2020-11-30 NOTE — Telephone Encounter (Signed)
On 1/28-I called patient and spoke to her regarding the results of her slightly elevated thyroglobulin levels; positive thyroglobulin antibodies.  Patient's thyroglobulin levels are not rising over the last 3-4 years.  They have been fairly stable.  Recommend continued monitoring at this time; recommend imaging/further work-up if significant rise in thyroid blood levels is noted.  Patient is in agreement.  Thank you for the call.

## 2021-01-03 ENCOUNTER — Other Ambulatory Visit: Payer: Self-pay

## 2021-01-03 ENCOUNTER — Ambulatory Visit
Admission: RE | Admit: 2021-01-03 | Discharge: 2021-01-03 | Disposition: A | Discharge status: Home / Self Care | Payer: PPO | Source: Ambulatory Visit | Attending: Physician Assistant | Admitting: Physician Assistant

## 2021-01-03 VITALS — BP 145/71 | HR 110 | Temp 98.4°F | Resp 16 | Ht 64.0 in | Wt 125.0 lb

## 2021-01-03 DIAGNOSIS — J029 Acute pharyngitis, unspecified: Secondary | ICD-10-CM | POA: Diagnosis not present

## 2021-01-03 DIAGNOSIS — J069 Acute upper respiratory infection, unspecified: Secondary | ICD-10-CM | POA: Diagnosis not present

## 2021-01-03 DIAGNOSIS — R059 Cough, unspecified: Secondary | ICD-10-CM | POA: Diagnosis not present

## 2021-01-03 DIAGNOSIS — Z85118 Personal history of other malignant neoplasm of bronchus and lung: Secondary | ICD-10-CM | POA: Diagnosis not present

## 2021-01-03 DIAGNOSIS — K0889 Other specified disorders of teeth and supporting structures: Secondary | ICD-10-CM | POA: Insufficient documentation

## 2021-01-03 DIAGNOSIS — Z20822 Contact with and (suspected) exposure to covid-19: Secondary | ICD-10-CM | POA: Diagnosis not present

## 2021-01-03 LAB — CBC WITH DIFFERENTIAL/PLATELET
Abs Immature Granulocytes: 0.03 10*3/uL (ref 0.00–0.07)
Basophils Absolute: 0 10*3/uL (ref 0.0–0.1)
Basophils Relative: 1 %
Eosinophils Absolute: 0.1 10*3/uL (ref 0.0–0.5)
Eosinophils Relative: 1 %
HCT: 36.7 % (ref 36.0–46.0)
Hemoglobin: 12 g/dL (ref 12.0–15.0)
Immature Granulocytes: 1 %
Lymphocytes Relative: 25 %
Lymphs Abs: 1.4 10*3/uL (ref 0.7–4.0)
MCH: 30.8 pg (ref 26.0–34.0)
MCHC: 32.7 g/dL (ref 30.0–36.0)
MCV: 94.1 fL (ref 80.0–100.0)
Monocytes Absolute: 0.6 10*3/uL (ref 0.1–1.0)
Monocytes Relative: 11 %
Neutro Abs: 3.6 10*3/uL (ref 1.7–7.7)
Neutrophils Relative %: 61 %
Platelets: 141 10*3/uL — ABNORMAL LOW (ref 150–400)
RBC: 3.9 MIL/uL (ref 3.87–5.11)
RDW: 14.6 % (ref 11.5–15.5)
WBC: 5.8 10*3/uL (ref 4.0–10.5)
nRBC: 0 % (ref 0.0–0.2)

## 2021-01-03 LAB — SARS CORONAVIRUS 2 (TAT 6-24 HRS): SARS Coronavirus 2: NEGATIVE

## 2021-01-03 MED ORDER — BENZONATATE 100 MG PO CAPS: 100.0000 mg | ORAL_CAPSULE | Freq: Three times a day (TID) | ORAL | 0 refills | Status: DC | PRN

## 2021-01-03 MED ORDER — CETIRIZINE HCL 10 MG PO TABS: 10.0000 mg | ORAL_TABLET | Freq: Every day | ORAL | 0 refills | Status: DC

## 2021-01-03 MED ORDER — FLUTICASONE PROPIONATE 50 MCG/ACT NA SUSP: 1.0000 | Freq: Every day | NASAL | 2 refills | Status: DC

## 2021-01-03 MED ORDER — IPRATROPIUM BROMIDE 0.06 % NA SOLN: 2.0000 | Freq: Four times a day (QID) | NASAL | 12 refills | Status: DC | PRN

## 2021-01-03 NOTE — ED Triage Notes (Signed)
Patient c/o dry cough, sneezing, nasal congestion, headache that started on Friday.  Patient denies fevers.

## 2021-01-03 NOTE — ED Provider Notes (Signed)
[facility]    CSN: [id number] Arrival date & time: 01/03/21  1328      History   Chief Complaint Chief Complaint  Patient presents with  . Nasal Congestion  . Cough    HPI [name] is a 75 y.o. female.   [name] presents with complaints of nasal drainage which is causing sore throat, cough, and even dental pain. Started around 2 days ago. She had been sitting outside in her garage enjoying the weather, prior to onset of symptoms. No gi symptoms. No ear pain. No shortness of breath . Cough is dry. No known fevers. Feels similar to sinus infections she has had in the past, although has been a long time since she has had this. Has taken benadryl for symptoms which has minimally helped. No chest pain. No known ill contacts. She is interested in covid testing as her husband is having cataract surgery tomorrow morning.  History of lung cancer, follows with oncology.    ROS per HPI, negative if not otherwise mentioned.      Past Medical History:  Diagnosis Date  . 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 GERD (gastroesophageal reflux disease)   . GIB (gastrointestinal bleeding)   . Groin pain    Along superior/laterl R inguinal canal.  Could be due to hernia or old scar tissue.  prev with CT done w/o alarming findings.  Will treat episodically.  Use tylenol #3 prn.    . Hyperlipidemia   . Lipoma of thigh    Left  . Normal cardiac stress test 2014  . PONV (postoperative nausea and vomiting)    nausea after hysterectomy, and 2 days after lung surgery 10/2015  . Thyroid cancer ([place]) 12/2015  . Thyroid nodule     Patient Active Problem List   Diagnosis Date Noted  . Other social stressor 10/21/2020  . Hypothyroidism, unspecified 07/02/2020  . Wart 07/02/2020  . Dermatitis 02/20/2020  . Stress reaction 02/20/2020  . Adenomatous polyp of ascending colon   . Polyp of transverse colon   . Gastric polyp   . Stomach irritation   . Healthcare  maintenance 06/30/2019  . GI bleed 05/17/2019  . Upper back pain 04/14/2018  . Aortic atherosclerosis ([place]) 05/31/2017  . Breast pain 03/25/2017  . Cancer of lower lobe of right lung ([place]) 05/06/2016  . Thyroid cancer ([place]) 09/11/2015  . Adenopathy   . Advance care planning 05/14/2014  . Thrombocytopenia ([place]) 07/03/2013  . Osteopenia 05/05/2013  . Medicare annual wellness visit, subsequent 04/26/2012  . GERD (gastroesophageal reflux disease) 04/26/2012  . Abdominal discomfort 01/12/2012  . Essential hypertension 11/17/2010  . HLD (hyperlipidemia) 01/31/2008  . 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 ABDOMINAL HYSTERECTOMY    . BREAST EXCISIONAL BIOPSY Right 1977  . BREAST MASS EXCISION  1977   benign  . CARPAL TUNNEL RELEASE Right 1978  . CATARACT EXTRACTION, BILATERAL Bilateral   . CHOLECYSTECTOMY  11/23/2016   Procedure: LAPAROSCOPIC CHOLECYSTECTOMY;  Surgeon: Clayburn Pert, MD;  Location: [facility];  Service: General;;  . COLONOSCOPY  2009  . COLONOSCOPY WITH PROPOFOL N/A 05/18/2019   Procedure: COLONOSCOPY WITH PROPOFOL;  Surgeon: Virgel Manifold, MD;  Location: [facility];  Service: Endoscopy;  Laterality: N/A;  . COLONOSCOPY WITH PROPOFOL N/A 08/15/2019   Procedure: COLONOSCOPY WITH PROPOFOL;  Surgeon: Virgel Manifold, MD;  Location: [facility];  Service: Endoscopy;  Laterality: N/A;  .  DILATION AND CURETTAGE OF UTERUS  2001  . ELECTROMAGNETIC NAVIGATION BROCHOSCOPY N/A 08/25/2015   Procedure: ELECTROMAGNETIC NAVIGATION BRONCHOSCOPY;  Surgeon: Flora Lipps, MD;  Location: [facility];  Service: Cardiopulmonary;  Laterality: N/A;  . ENDOBRONCHIAL ULTRASOUND N/A 08/25/2015   Procedure: ENDOBRONCHIAL ULTRASOUND;  Surgeon: Flora Lipps, MD;  Location: [facility];  Service: Cardiopulmonary;  Laterality: N/A;  . ESOPHAGOGASTRODUODENOSCOPY  2002, 11/15/07   Normal (Dr. Allyn Kenner)  .  ESOPHAGOGASTRODUODENOSCOPY (EGD) WITH PROPOFOL N/A 05/18/2019   Procedure: ESOPHAGOGASTRODUODENOSCOPY (EGD) WITH PROPOFOL;  Surgeon: Virgel Manifold, MD;  Location: [facility];  Service: Endoscopy;  Laterality: N/A;  . ESOPHAGOGASTRODUODENOSCOPY (EGD) WITH PROPOFOL N/A 08/15/2019   Procedure: ESOPHAGOGASTRODUODENOSCOPY (EGD) WITH PROPOFOL;  Surgeon: Virgel Manifold, MD;  Location: [facility];  Service: Endoscopy;  Laterality: N/A;  . FACIAL COSMETIC SURGERY  01/13/10   mini facelift  . HERNIA REPAIR  1976  . OOPHORECTOMY    . THORACOTOMY/LOBECTOMY Right 10/27/2015   Procedure: THORACOTOMY/LOBECTOMY;  Surgeon: Nestor Lewandowsky, MD;  Location: [facility];  Service: Thoracic;  Laterality: Right;  . THYROIDECTOMY N/A 12/29/2015   Procedure: THYROIDECTOMY;  Surgeon: Beverly Gust, MD;  Location: [facility];  Service: ENT;  Laterality: N/A;  . TOTAL VAGINAL HYSTERECTOMY  12/30/08   for fibroid pain (Dr. Gertie Fey)    OB History    Gravida  0   Para  0   Term  0   Preterm  0   AB  0   Living  0     SAB  0   IAB  0   Ectopic  0   Multiple  0   Live Births           Obstetric Comments  1st Menstrual Cycle:  10 1st Pregnancy:  1         Home Medications    Prior to Admission medications   Medication Sig Start Date End Date Taking? Authorizing Provider  aspirin EC 81 MG tablet Take 1 tablet (81 mg total) by mouth daily. 11/29/19  Yes Tonia Ghent, MD  benzonatate (TESSALON) 100 MG capsule Take 1-2 capsules (100-200 mg total) by mouth 3 (three) times daily as needed for cough. 01/03/21  Yes Augusto Gamble B, NP  Biotin 1000 MCG tablet Take 1,000 mcg by mouth daily. Reported on 02/04/2016   Yes [provider]  CALCIUM-MAG-VIT C-VIT D PO Take by mouth daily.   Yes [provider]  cetirizine (ZYRTEC) 10 MG tablet Take 1 tablet (10 mg total) by mouth daily. 01/03/21  Yes Augusto Gamble B, NP  Cholecalciferol (VITAMIN D-3) 1000 units CAPS Take 1 capsule  by mouth daily with supper.    Yes [provider]  Coenzyme Q10 200 MG capsule Take 200 mg by mouth daily.   Yes [provider]  fluticasone (FLONASE) 50 MCG/ACT nasal spray Place 1 spray into both nostrils daily. 01/03/21  Yes Zigmund Gottron, NP  Folic Acid-Vit P3-IRJ J88 (FA-VITAMIN B-6-VITAMIN B-12 PO) Take 1 tablet by mouth daily after breakfast. Reported on 02/04/2016   Yes [provider]  ipratropium (ATROVENT) 0.06 % nasal spray Place 2 sprays into both nostrils 4 (four) times daily as needed for rhinitis. 01/03/21  Yes Ridge Lafond, Lanelle Bal B, NP  levothyroxine (SYNTHROID) 88 MCG tablet TAKE 1 TABLET (88 MCG TOTAL) BY MOUTH DAILY BEFORE BREAKFAST. 10/09/20  Yes Cammie Sickle, MD  Lutein 20 MG CAPS Take 20 mg by mouth daily.   Yes [provider]  Omega-3 Fatty Acids (  SUPER OMEGA 3 EPA/DHA) 1000 MG CAPS Take 1,000 mg by mouth daily with supper.   Yes [provider]  omeprazole (PRILOSEC) 40 MG capsule TAKE 1 CAPSULE BY MOUTH EVERY DAY 11/19/20  Yes Tonia Ghent, MD  Potassium Gluconate 595 MG CAPS Take 595 mg by mouth daily.    Yes [provider]  pravastatin (PRAVACHOL) 40 MG tablet TAKE 1 TABLET BY MOUTH EVERY DAY 07/01/20  Yes Tonia Ghent, MD  Ascorbic Acid (VITAMIN C) 500 MG tablet Take 2,000 mg by mouth daily. Reported on 02/04/2016    [provider]  Calcium Carbonate (CALCIUM 500 PO) Take 1 tablet by mouth every morning.  06/29/20  [provider]  calcium gluconate 500 MG tablet Take 500 mg by mouth daily.    06/29/20  [provider]  metoprolol succinate (TOPROL-XL) 25 MG 24 hr tablet Take 25 mg by mouth at bedtime.  06/29/20  [provider]  simvastatin (ZOCOR) 40 MG tablet Take 40 mg by mouth at bedtime.  06/29/20  [provider]    Family History Family History  Problem Relation Age of Onset  . Transient ischemic attack Mother   . Hypertension Mother   . Stroke Mother         mini strokes  . Hypertension Sister   . Cancer Sister        ovarian  . Hypertension Sister   . Hypertension Sister   . Hypertension Sister   . Breast cancer Neg Hx   . Colon cancer Neg Hx     Social History Social History   Tobacco Use  . Smoking status: Never Smoker  . Smokeless tobacco: Never Used  Vaping Use  . Vaping Use: Never used  Substance Use Topics  . Alcohol use: No    Alcohol/week: 0.0 standard drinks  . Drug use: No     Allergies   Atorvastatin, Ciprofloxacin, Epinephrine, Metoprolol succinate, Simvastatin, and Sulfa antibiotics   Review of Systems Review of Systems   Physical Exam Triage Vital Signs ED Triage Vitals  Enc Vitals Group     BP 01/03/21 1337 (!) 145/71     Pulse Rate 01/03/21 1337 (!) 110     Resp 01/03/21 1337 16     Temp 01/03/21 1337 98.4 F (36.9 C)     Temp Source 01/03/21 1337 Oral     SpO2 01/03/21 1337 100 %     Weight 01/03/21 1336 125 lb (56.7 kg)     Height 01/03/21 1336 5\' 4"  (1.626 m)     Head Circumference --      Peak Flow --      Pain Score 01/03/21 1336 3     Pain Loc --      Pain Edu? --      Excl. in [place]? --    No data found.  Updated Vital Signs BP (!) 145/71 (BP Location: Left Arm)   Pulse (!) 110   Temp 98.4 F (36.9 C) (Oral)   Resp 16   Ht 5\' 4"  (1.626 m)   Wt 125 lb (56.7 kg)   SpO2 100%   BMI 21.46 kg/m   Visual Acuity Right Eye Distance:   Left Eye Distance:   Bilateral Distance:    Right Eye Near:   Left Eye Near:    Bilateral Near:     Physical Exam Constitutional:      General: She is not in acute distress.    Appearance: She is  well-developed.  HENT:     Right Ear: Tympanic membrane and ear canal normal.     Left Ear: Tympanic membrane and ear canal normal.     Nose: Rhinorrhea present.     Mouth/Throat:     Mouth: Mucous membranes are moist.  Cardiovascular:     Rate and Rhythm: Normal rate.  Pulmonary:     Effort: Pulmonary effort is normal.  Skin:    General:  Skin is warm and dry.  Neurological:     Mental Status: She is alert and oriented to person, place, and time.      UC Treatments / Results  Labs (all labs ordered are listed, but only abnormal results are displayed) Labs Reviewed  CBC WITH DIFFERENTIAL/PLATELET - Abnormal; Notable for the following components:      Result Value   Platelets 141 (*)    All other components within normal limits  SARS CORONAVIRUS 2 (TAT 6-24 HRS)    EKG   Radiology No results found.  Procedures Procedures (including critical care time)  Medications Ordered in UC Medications - No data to display  Initial Impression / Assessment and Plan / UC Course  I have reviewed the triage vital signs and the nursing notes.  Pertinent labs & imaging results that were available during my care of the patient were reviewed by me and considered in my medical decision making (see chart for details).     Non toxic. Benign physical exam.  No work of breathing. Afebrile. Two days of symptoms. Patient is primarily concerned about covid testing prior to her husband's procedure tomorrow, testing is pending. WBC is normal today which is reassuring. Exam and history consistent with viral vs allergic etiology. Supportive cares recommended. Noted mild tachycardia. No chest pain , no shortness of breath . Return precautions provided. Patient verbalized understanding and agreeable to plan.   Final Clinical Impressions(s) / UC Diagnoses   Final diagnoses:  Upper respiratory tract infection, unspecified type     Discharge Instructions     Your white blood cell count is normal today, which is reassuring, particularly in combination with lack of fever.  This is likely viral or allergic in nature.  Daily zyrtec and flonase may help with your symptoms.  Ipratropium nasal spray as needed for nasal drainage.  Push fluids to ensure adequate hydration and keep secretions thin.   Tessalon as needed for cough.  Your covid test  will result in the next 24 hours. Please check your MyChart to view those results.  If symptoms worsen or do not improve in the next week to return to be seen or to follow up with your PCP.      ED Prescriptions    Medication Sig Dispense Auth. Provider   cetirizine (ZYRTEC) 10 MG tablet Take 1 tablet (10 mg total) by mouth daily. 30 tablet Augusto Gamble B, NP   fluticasone (FLONASE) 50 MCG/ACT nasal spray Place 1 spray into both nostrils daily. 16 g Augusto Gamble B, NP   benzonatate (TESSALON) 100 MG capsule Take 1-2 capsules (100-200 mg total) by mouth 3 (three) times daily as needed for cough. 21 capsule Augusto Gamble B, NP   ipratropium (ATROVENT) 0.06 % nasal spray Place 2 sprays into both nostrils 4 (four) times daily as needed for rhinitis. 15 mL Zigmund Gottron, NP     PDMP not reviewed this encounter.   Zigmund Gottron, NP 01/03/21 1415

## 2021-01-03 NOTE — Discharge Instructions (Addendum)
Your white blood cell count is normal today, which is reassuring, particularly in combination with lack of fever.  This is likely viral or allergic in nature.  Daily zyrtec and flonase may help with your symptoms.  Ipratropium nasal spray as needed for nasal drainage.  Push fluids to ensure adequate hydration and keep secretions thin.   Tessalon as needed for cough.  Your covid test will result in the next 24 hours. Please check your MyChart to view those results.  If symptoms worsen or do not improve in the next week to return to be seen or to follow up with your PCP.

## 2021-02-01 ENCOUNTER — Ambulatory Visit: Payer: PPO | Admitting: Cardiology

## 2021-02-01 ENCOUNTER — Other Ambulatory Visit: Payer: Self-pay

## 2021-02-01 ENCOUNTER — Encounter: Payer: Self-pay | Admitting: Cardiology

## 2021-02-01 VITALS — BP 126/80 | HR 88 | Ht 64.5 in | Wt 129.0 lb

## 2021-02-01 DIAGNOSIS — I2510 Atherosclerotic heart disease of native coronary artery without angina pectoris: Secondary | ICD-10-CM | POA: Diagnosis not present

## 2021-02-01 DIAGNOSIS — I2584 Coronary atherosclerosis due to calcified coronary lesion: Secondary | ICD-10-CM

## 2021-02-01 DIAGNOSIS — E7800 Pure hypercholesterolemia, unspecified: Secondary | ICD-10-CM | POA: Diagnosis not present

## 2021-02-01 NOTE — Patient Instructions (Signed)
Medication Instructions:  Your physician recommends that you continue on your current medications as directed. Please refer to the Current Medication list given to you today.  *If you need a refill on your cardiac medications before your next appointment, please call your pharmacy*   Lab Work: Non ordered If you have labs (blood work) drawn today and your tests are completely normal, you will receive your results only by: Marland Kitchen MyChart Message (if you have MyChart) OR . A paper copy in the mail If you have any lab test that is abnormal or we need to change your treatment, we will call you to review the results.   Testing/Procedures: None ordered   Follow-Up: At [facility], you and your health needs are our priority.  As part of our continuing mission to provide you with exceptional heart care, we have created designated Provider Care Teams.  These Care Teams include your primary Cardiologist (physician) and Advanced Practice Providers (APPs -  Physician Assistants and Nurse Practitioners) who all work together to provide you with the care you need, when you need it.  We recommend signing up for the patient portal called "MyChart".  Sign up information is provided on this After Visit Summary.  MyChart is used to connect with patients for Virtual Visits (Telemedicine).  Patients are able to view lab/test results, encounter notes, upcoming appointments, etc.  Non-urgent messages can be sent to your provider as well.   To learn more about what you can do with MyChart, go to [email or web address].    Your next appointment:   12-18 month(s)  The format for your next appointment:   In Person  Provider:   Kate Sable, MD   Other Instructions

## 2021-02-01 NOTE — Progress Notes (Signed)
Cardiology Office Note:    Date:  02/01/2021   ID:  [name], DOB 1946-01-31, MRN [id number]  PCP:  Tonia Ghent, MD  Cardiologist:  Kate Sable, MD  Electrophysiologist:  None   Referring MD: Tonia Ghent, MD   Chief Complaint  Patient presents with  . Other    12 month follow up. Paitent c.o breast pain. Meds reviewed verbally with patient.     History of Present Illness:    [name] is a 75 y.o. female with a hx of hyperlipidemia, coronary calcifications, pulmonary nodule, who presents for follow-up.    She was last seen due to coronary artery calcifications which were found after chest CT for pulmonary nodule monitoring ordered.  Denies chest pain or shortness of breath.  Endorses having breast pain.  Previous testing with echo and Myoview were normal.  Tolerating doses of Pravachol and aspirin 81 mg daily.  States having some breast pain after obtaining Covid booster 2 months ago.  Otherwise has no other concerns at this time.  Recently lost her son due to end-stage liver disease.  Has been exercising which is helping with her mental state.   Prior notes chest CT 04/2020 showed aortic atherosclerosis and also calcifications in the LAD.   Echocardiogram 12/2019 showed normal systolic and diastolic function, EF 60 to 65%. Lexiscan Myoview 12/2019, normal, no evidence for ischemia. History of statin intolerance/myalgias with simvastatin, Lipitor.  Able to tolerate Pravachol.   Past Medical History:  Diagnosis Date  . 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 GERD (gastroesophageal reflux disease)   . GIB (gastrointestinal bleeding)   . Groin pain    Along superior/laterl R inguinal canal.  Could be due to hernia or old scar tissue.  prev with CT done w/o alarming findings.  Will treat episodically.  Use tylenol #3 prn.    . Hyperlipidemia   . Lipoma of thigh    Left  . Normal cardiac stress test 2014  . PONV (postoperative nausea and vomiting)     nausea after hysterectomy, and 2 days after lung surgery 10/2015  . Thyroid cancer ([place]) 12/2015  . Thyroid nodule     Past Surgical History:  Procedure Laterality Date  . ABDOMINAL HYSTERECTOMY    . BREAST EXCISIONAL BIOPSY Right 1977  . BREAST MASS EXCISION  1977   benign  . CARPAL TUNNEL RELEASE Right 1978  . CATARACT EXTRACTION, BILATERAL Bilateral   . CHOLECYSTECTOMY  11/23/2016   Procedure: LAPAROSCOPIC CHOLECYSTECTOMY;  Surgeon: Clayburn Pert, MD;  Location: [facility];  Service: General;;  . COLONOSCOPY  2009  . COLONOSCOPY WITH PROPOFOL N/A 05/18/2019   Procedure: COLONOSCOPY WITH PROPOFOL;  Surgeon: Virgel Manifold, MD;  Location: [facility];  Service: Endoscopy;  Laterality: N/A;  . COLONOSCOPY WITH PROPOFOL N/A 08/15/2019   Procedure: COLONOSCOPY WITH PROPOFOL;  Surgeon: Virgel Manifold, MD;  Location: [facility];  Service: Endoscopy;  Laterality: N/A;  . DILATION AND CURETTAGE OF UTERUS  2001  . ELECTROMAGNETIC NAVIGATION BROCHOSCOPY N/A 08/25/2015   Procedure: ELECTROMAGNETIC NAVIGATION BRONCHOSCOPY;  Surgeon: Flora Lipps, MD;  Location: [facility];  Service: Cardiopulmonary;  Laterality: N/A;  . ENDOBRONCHIAL ULTRASOUND N/A 08/25/2015   Procedure: ENDOBRONCHIAL ULTRASOUND;  Surgeon: Flora Lipps, MD;  Location: [facility];  Service: Cardiopulmonary;  Laterality: N/A;  . ESOPHAGOGASTRODUODENOSCOPY  2002, 11/15/07   Normal (Dr. Allyn Kenner)  . ESOPHAGOGASTRODUODENOSCOPY (EGD) WITH PROPOFOL N/A 05/18/2019   Procedure: ESOPHAGOGASTRODUODENOSCOPY (EGD) WITH PROPOFOL;  Surgeon: Virgel Manifold, MD;  Location: [facility];  Service: Endoscopy;  Laterality: N/A;  . ESOPHAGOGASTRODUODENOSCOPY (EGD) WITH PROPOFOL N/A 08/15/2019   Procedure: ESOPHAGOGASTRODUODENOSCOPY (EGD) WITH PROPOFOL;  Surgeon: Virgel Manifold, MD;  Location: [facility];  Service: Endoscopy;  Laterality: N/A;  . FACIAL COSMETIC SURGERY  01/13/10   mini facelift  . HERNIA REPAIR  1976   . OOPHORECTOMY    . THORACOTOMY/LOBECTOMY Right 10/27/2015   Procedure: THORACOTOMY/LOBECTOMY;  Surgeon: Nestor Lewandowsky, MD;  Location: [facility];  Service: Thoracic;  Laterality: Right;  . THYROIDECTOMY N/A 12/29/2015   Procedure: THYROIDECTOMY;  Surgeon: Beverly Gust, MD;  Location: [facility];  Service: ENT;  Laterality: N/A;  . TOTAL VAGINAL HYSTERECTOMY  12/30/08   for fibroid pain (Dr. Gertie Fey)    Current Medications: Current Meds  Medication Sig  . Ascorbic Acid (VITAMIN C) 500 MG tablet Take 2,000 mg by mouth daily. Reported on 02/04/2016  . aspirin EC 81 MG tablet Take 1 tablet (81 mg total) by mouth daily.  . Biotin 1000 MCG tablet Take 1,000 mcg by mouth daily. Reported on 02/04/2016  . CALCIUM-MAG-VIT C-VIT D PO Take by mouth daily.  . Cholecalciferol (VITAMIN D-3) 1000 units CAPS Take 1 capsule by mouth daily with supper.   . Coenzyme Q10 200 MG capsule Take 200 mg by mouth daily.  . Folic Acid-Vit Y8-MVH Q46 (FA-VITAMIN B-6-VITAMIN B-12 PO) Take 1 tablet by mouth daily after breakfast. Reported on 02/04/2016  . ipratropium (ATROVENT) 0.06 % nasal spray Place 2 sprays into both nostrils 4 (four) times daily as needed for rhinitis.  Marland Kitchen levothyroxine (SYNTHROID) 88 MCG tablet TAKE 1 TABLET (88 MCG TOTAL) BY MOUTH DAILY BEFORE BREAKFAST.  Marland Kitchen Lutein 20 MG CAPS Take 20 mg by mouth daily.  . Omega-3 Fatty Acids (SUPER OMEGA 3 EPA/DHA) 1000 MG CAPS Take 1,000 mg by mouth daily with supper.  Marland Kitchen omeprazole (PRILOSEC) 40 MG capsule TAKE 1 CAPSULE BY MOUTH EVERY DAY  . Potassium Gluconate 595 MG CAPS Take 595 mg by mouth daily.   . pravastatin (PRAVACHOL) 40 MG tablet TAKE 1 TABLET BY MOUTH EVERY DAY     Allergies:   Atorvastatin, Ciprofloxacin, Epinephrine, Metoprolol succinate, Simvastatin, and Sulfa antibiotics   Social History   Socioeconomic History  . Marital status: Married    Spouse name: Not on file  . Number of children: Not on file  . Years of education: Not on file  . Highest  education level: Not on file  Occupational History  . 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 Smoking status: Never Smoker  . Smokeless tobacco: Never Used  Vaping Use  . Vaping Use: Never used  Substance and Sexual Activity  . Alcohol use: No    Alcohol/week: 0.0 standard drinks  . Drug use: No  . Sexual activity: Never  Other Topics Concern  . Not on file  Social History Narrative   Married 1962/01/31 and lives with husband (he had a CVA 11/12, to SNF and then home as of February 01, 2020).  He has short term memory loss.     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   Previously had an adopted son who died in 2020-02-01.   Social Determinants of Health   Financial Resource Strain: Low Risk   . Difficulty of Paying Living Expenses: Not hard at all  Food Insecurity: No Food Insecurity  . Worried About Charity fundraiser in the Last Year: Never true  . Ran Out of Food in the Last Year: Never true  Transportation Needs: No Transportation Needs  . Lack of Transportation (Medical): No  . Lack of Transportation (Non-Medical): No  Physical Activity: Sufficiently Active  . Days of Exercise per Week: 3 days  . Minutes of Exercise per Session: 60 min  Stress: No Stress Concern Present  . Feeling of Stress : Not at all  Social Connections: Not on file     Family History: The patient's family history includes Cancer in her sister; Hypertension in her mother, sister, sister, sister, and sister; Stroke in her mother; Transient ischemic attack in her mother. There is no history of Breast cancer or Colon cancer.  ROS:   Please see the history of present illness.     All other systems reviewed and are negative.  EKGs/Labs/Other Studies Reviewed:    The following studies were reviewed today:   EKG:  EKG is  ordered today.  The ekg ordered today demonstrates normal sinus rhythm, normal ekg.  Recent Labs: 11/13/2020: ALT 34; BUN 18; Creatinine, Ser 0.65; Potassium 4.3; Sodium 138; TSH 0.607 01/03/2021:  Hemoglobin 12.0; Platelets 141  Recent Lipid Panel    Component Value Date/Time   CHOL 147 06/22/2020 0907   CHOL 145 07/01/2013 0103   TRIG 144.0 06/22/2020 0907   TRIG 224 (H) 07/01/2013 0103   HDL 41.90 06/22/2020 0907   HDL 29 (L) 07/01/2013 0103   CHOLHDL 4 06/22/2020 0907   VLDL 28.8 06/22/2020 0907   VLDL 45 (H) 07/01/2013 0103   LDLCALC 77 06/22/2020 0907   LDLCALC 71 07/01/2013 0103   LDLDIRECT 83.8 03/11/2011 0823    Physical Exam:    VS:  BP 126/80 (BP Location: Left Arm, Patient Position: Sitting, Cuff Size: Normal)   Pulse 88   Ht 5' 4.5" (1.638 m)   Wt 129 lb (58.5 kg)   SpO2 99%   BMI 21.80 kg/m     Wt Readings from Last 3 Encounters:  02/01/21 129 lb (58.5 kg)  01/03/21 125 lb (56.7 kg)  11/20/20 124 lb (56.2 kg)     GEN:  Well nourished, well developed in no acute distress HEENT: Normal NECK: No JVD; No carotid bruits LYMPHATICS: No lymphadenopathy CARDIAC: RRR, no murmurs, rubs, gallops RESPIRATORY:  Clear to auscultation without rales, wheezing or rhonchi  ABDOMEN: Soft, non-tender, non-distended MUSCULOSKELETAL:  No edema; No deformity  SKIN: Warm and dry NEUROLOGIC:  Alert and oriented x 3 PSYCHIATRIC:  Normal affect   ASSESSMENT:    1. Coronary artery calcification   2. Pure hypercholesterolemia    PLAN:    In order of problems listed above:  1. Coronary artery calcifications noted on recent chest CT. Echo with normal systolic and diastolic function, Lexiscan Myoview with no significant ischemia.  Continue aspirin 81 mg daily, Pravachol 40 mg daily. 2. History of hyperlipidemia not tolerant to simvastatin or Lipitor.  Continue Pravachol 40 mg daily.  Last LDL well controlled.  Follow-up in 12-18 months.   Medication Adjustments/Labs and Tests Ordered: Current medicines are reviewed at length with the patient today.  Concerns regarding medicines are outlined above.  Orders Placed This Encounter  Procedures  . EKG 12-Lead   No  orders of the defined types were placed in this encounter.   Patient Instructions  Medication Instructions:  Your physician recommends that you continue on your current medications as directed. Please refer to the Current Medication list given to you today.  *If you need a refill on your cardiac medications before your next appointment, please call your pharmacy*  Lab Work: Non ordered If you have labs (blood work) drawn today and your tests are completely normal, you will receive your results only by: Marland Kitchen MyChart Message (if you have MyChart) OR . A paper copy in the mail If you have any lab test that is abnormal or we need to change your treatment, we will call you to review the results.   Testing/Procedures: None ordered   Follow-Up: At [facility], you and your health needs are our priority.  As part of our continuing mission to provide you with exceptional heart care, we have created designated Provider Care Teams.  These Care Teams include your primary Cardiologist (physician) and Advanced Practice Providers (APPs -  Physician Assistants and Nurse Practitioners) who all work together to provide you with the care you need, when you need it.  We recommend signing up for the patient portal called "MyChart".  Sign up information is provided on this After Visit Summary.  MyChart is used to connect with patients for Virtual Visits (Telemedicine).  Patients are able to view lab/test results, encounter notes, upcoming appointments, etc.  Non-urgent messages can be sent to your provider as well.   To learn more about what you can do with MyChart, go to [email or web address].    Your next appointment:   12-18 month(s)  The format for your next appointment:   In Person  Provider:   Kate Sable, MD   Other Instructions     Signed, Kate Sable, MD  02/01/2021 4:57 PM    [place]

## 2021-02-24 ENCOUNTER — Other Ambulatory Visit: Payer: Self-pay

## 2021-02-24 ENCOUNTER — Other Ambulatory Visit: Payer: Self-pay | Admitting: Family Medicine

## 2021-02-24 ENCOUNTER — Ambulatory Visit (INDEPENDENT_AMBULATORY_CARE_PROVIDER_SITE_OTHER): Payer: PPO | Admitting: Family Medicine

## 2021-02-24 VITALS — BP 146/80 | HR 99 | Temp 98.5°F | Wt 127.5 lb

## 2021-02-24 DIAGNOSIS — N644 Mastodynia: Secondary | ICD-10-CM | POA: Diagnosis not present

## 2021-02-24 DIAGNOSIS — Z1231 Encounter for screening mammogram for malignant neoplasm of breast: Secondary | ICD-10-CM

## 2021-02-24 DIAGNOSIS — K219 Gastro-esophageal reflux disease without esophagitis: Secondary | ICD-10-CM

## 2021-02-24 NOTE — Patient Instructions (Addendum)
#  Breast Pain - Imaging studies - you can call [facility] to schedule - can use topical Voltaren Gel for pain  #Gas - try taking omeprazole twice daily for 1-2 weeks  - then return to once per day

## 2021-02-24 NOTE — Progress Notes (Signed)
Subjective:     [name] is a 75 y.o. female presenting for Breast Pain (Both x 3 months ) and GI Problem (Gas and bloating more intense over last month )     HPI  #Breast pain - started Jan 2022 - no pain with palpation - but will hurt with movement - not like period breast pain - no severe - "like a dull headache" - noticed it after her covid boost - will get better and then will bother her more and more - no lumps or lesions on the breast - no drainage or change in size - left has always been slightly larger  #Gas/Bloating - hx of reflux and typically treated with diet - taking omeprazole 40 mg at nighttime  - worse recently - bloating after super - would treat with gas-x - is having bm and passing gas w/o issue - hx of GI bleed - no blood in stool  - BM - 3 times per day, soft  Review of Systems   Social History   Tobacco Use  Smoking Status Never Smoker  Smokeless Tobacco Never Used        Objective:    BP Readings from Last 3 Encounters:  02/24/21 (!) 146/80  02/01/21 126/80  01/03/21 (!) 145/71   Wt Readings from Last 3 Encounters:  02/24/21 127 lb 8 oz (57.8 kg)  02/01/21 129 lb (58.5 kg)  01/03/21 125 lb (56.7 kg)    BP (!) 146/80   Pulse 99   Temp 98.5 F (36.9 C) (Temporal)   Wt 127 lb 8 oz (57.8 kg)   SpO2 99%   BMI 21.55 kg/m    Physical Exam Exam conducted with a chaperone present.  Constitutional:      General: She is not in acute distress.    Appearance: She is well-developed. She is not diaphoretic.  HENT:     Right Ear: External ear normal.     Left Ear: External ear normal.  Eyes:     Conjunctiva/sclera: Conjunctivae normal.  Cardiovascular:     Rate and Rhythm: Normal rate and regular rhythm.  Pulmonary:     Effort: Pulmonary effort is normal. No respiratory distress.     Breath sounds: Normal breath sounds. No wheezing.  Chest:  Breasts: Breasts are symmetrical.     Right: Normal. No mass or skin change.      Left: Normal. No mass or skin change.    Abdominal:     General: Abdomen is flat. Bowel sounds are increased.     Palpations: Abdomen is soft.     Tenderness: There is no abdominal tenderness. There is no guarding or rebound.  Musculoskeletal:     Cervical back: Neck supple.  Skin:    General: Skin is warm and dry.     Capillary Refill: Capillary refill takes less than 2 seconds.  Neurological:     Mental Status: She is alert. Mental status is at baseline.  Psychiatric:        Mood and Affect: Mood normal.        Behavior: Behavior normal.           Assessment & Plan:   Problem List Items Addressed This Visit      Digestive   GERD (gastroesophageal reflux disease)    Suspect symptoms of gas may be some worsening GERD. Advised short course of omeprazole 40 mg BID. Only for 1-2 weeks. If no improvement return to once daily and consider pcp  f/u.         Other   Breast pain - Primary    B/l breast pain x 3 months. Discussed normal exam and could consider watch and wait but given hx of lung and thyroid cancer elected to get imaging. Discussed voltaren gel prn for symptom relief if needed.       Relevant Orders   MM DIAG BREAST TOMO BILATERAL   US BREAST LTD UNI LEFT INC AXILLA   US BREAST LTD UNI RIGHT INC AXILLA       Return if symptoms worsen or fail to improve.  Lesleigh Noe, MD  This visit occurred during the SARS-CoV-2 public health emergency.  Safety protocols were in place, including screening questions prior to the visit, additional usage of staff PPE, and extensive cleaning of exam room while observing appropriate contact time as indicated for disinfecting solutions.

## 2021-02-24 NOTE — Assessment & Plan Note (Signed)
B/l breast pain x 3 months. Discussed normal exam and could consider watch and wait but given hx of lung and thyroid cancer elected to get imaging. Discussed voltaren gel prn for symptom relief if needed.

## 2021-02-24 NOTE — Assessment & Plan Note (Signed)
Suspect symptoms of gas may be some worsening GERD. Advised short course of omeprazole 40 mg BID. Only for 1-2 weeks. If no improvement return to once daily and consider pcp f/u.

## 2021-02-25 DIAGNOSIS — H029 Unspecified disorder of eyelid: Secondary | ICD-10-CM | POA: Diagnosis not present

## 2021-03-01 ENCOUNTER — Emergency Department: Payer: PPO

## 2021-03-01 ENCOUNTER — Encounter: Payer: Self-pay | Admitting: Radiology

## 2021-03-01 ENCOUNTER — Other Ambulatory Visit: Payer: Self-pay

## 2021-03-01 ENCOUNTER — Ambulatory Visit
Admission: RE | Admit: 2021-03-01 | Discharge: 2021-03-01 | Disposition: A | Discharge status: Home / Self Care | Payer: PPO | Source: Ambulatory Visit | Attending: Family Medicine | Admitting: Family Medicine

## 2021-03-01 ENCOUNTER — Emergency Department
Admission: EM | Admit: 2021-03-01 | Discharge: 2021-03-01 | Disposition: A | Discharge status: Home / Self Care | Payer: PPO | Attending: Emergency Medicine | Admitting: Emergency Medicine

## 2021-03-01 DIAGNOSIS — R922 Inconclusive mammogram: Secondary | ICD-10-CM | POA: Diagnosis not present

## 2021-03-01 DIAGNOSIS — Z85118 Personal history of other malignant neoplasm of bronchus and lung: Secondary | ICD-10-CM | POA: Insufficient documentation

## 2021-03-01 DIAGNOSIS — Z79899 Other long term (current) drug therapy: Secondary | ICD-10-CM | POA: Diagnosis not present

## 2021-03-01 DIAGNOSIS — E039 Hypothyroidism, unspecified: Secondary | ICD-10-CM | POA: Diagnosis not present

## 2021-03-01 DIAGNOSIS — M542 Cervicalgia: Secondary | ICD-10-CM | POA: Diagnosis not present

## 2021-03-01 DIAGNOSIS — N644 Mastodynia: Secondary | ICD-10-CM | POA: Insufficient documentation

## 2021-03-01 DIAGNOSIS — R079 Chest pain, unspecified: Secondary | ICD-10-CM | POA: Diagnosis not present

## 2021-03-01 DIAGNOSIS — Z85850 Personal history of malignant neoplasm of thyroid: Secondary | ICD-10-CM | POA: Insufficient documentation

## 2021-03-01 DIAGNOSIS — Z7982 Long term (current) use of aspirin: Secondary | ICD-10-CM | POA: Insufficient documentation

## 2021-03-01 DIAGNOSIS — I700 Atherosclerosis of aorta: Secondary | ICD-10-CM | POA: Diagnosis not present

## 2021-03-01 DIAGNOSIS — I10 Essential (primary) hypertension: Secondary | ICD-10-CM | POA: Insufficient documentation

## 2021-03-01 LAB — COMPREHENSIVE METABOLIC PANEL
ALT: 27 U/L (ref 0–44)
AST: 23 U/L (ref 15–41)
Albumin: 4.1 g/dL (ref 3.5–5.0)
Alkaline Phosphatase: 48 U/L (ref 38–126)
Anion gap: 9 (ref 5–15)
BUN: 16 mg/dL (ref 8–23)
CO2: 27 mmol/L (ref 22–32)
Calcium: 9 mg/dL (ref 8.9–10.3)
Chloride: 103 mmol/L (ref 98–111)
Creatinine, Ser: 0.64 mg/dL (ref 0.44–1.00)
GFR, Estimated: >60 mL/min (ref >60)
Glucose, Bld: 114 mg/dL — ABNORMAL HIGH (ref 70–99)
Potassium: 3.9 mmol/L (ref 3.5–5.1)
Sodium: 139 mmol/L (ref 135–145)
Total Bilirubin: 0.7 mg/dL (ref 0.3–1.2)
Total Protein: 6.4 g/dL — ABNORMAL LOW (ref 6.5–8.1)

## 2021-03-01 LAB — CBC WITH DIFFERENTIAL/PLATELET
Abs Immature Granulocytes: 0.02 10*3/uL (ref 0.00–0.07)
Basophils Absolute: 0.1 10*3/uL (ref 0.0–0.1)
Basophils Relative: 1 %
Eosinophils Absolute: 0.1 10*3/uL (ref 0.0–0.5)
Eosinophils Relative: 2 %
HCT: 39.8 % (ref 36.0–46.0)
Hemoglobin: 13.3 g/dL (ref 12.0–15.0)
Immature Granulocytes: 0 %
Lymphocytes Relative: 34 %
Lymphs Abs: 1.8 10*3/uL (ref 0.7–4.0)
MCH: 30.9 pg (ref 26.0–34.0)
MCHC: 33.4 g/dL (ref 30.0–36.0)
MCV: 92.6 fL (ref 80.0–100.0)
Monocytes Absolute: 0.4 10*3/uL (ref 0.1–1.0)
Monocytes Relative: 7 %
Neutro Abs: 3 10*3/uL (ref 1.7–7.7)
Neutrophils Relative %: 56 %
Platelets: 146 10*3/uL — ABNORMAL LOW (ref 150–400)
RBC: 4.3 MIL/uL (ref 3.87–5.11)
RDW: 14 % (ref 11.5–15.5)
WBC: 5.4 10*3/uL (ref 4.0–10.5)
nRBC: 0 % (ref 0.0–0.2)

## 2021-03-01 LAB — LIPASE, BLOOD: Lipase: 37 U/L (ref 11–51)

## 2021-03-01 LAB — D-DIMER, QUANTITATIVE: D-Dimer, Quant: 0.35 ug/mL-FEU (ref 0.00–0.50)

## 2021-03-01 LAB — TROPONIN I (HIGH SENSITIVITY)
Troponin I (High Sensitivity): 5 ng/L (ref <18)
Troponin I (High Sensitivity): 8 ng/L (ref <18)

## 2021-03-01 NOTE — ED Provider Notes (Signed)
[facility] Emergency Department Provider Note   ____________________________________________   Event Date/Time   First MD Initiated Contact with Patient 03/01/21 0321     (approximate)  I have reviewed the triage vital signs and the nursing notes.   HISTORY  Chief Complaint Breast Pain    HPI [name] is a 75 y.o. female brought to the ED via EMS from home with a chief complaint of breast pain.  Patient reports left-sided breast pain that has been ongoing since January 2022.  She is scheduled for mammogram later today.  Describes pain as "strange" and intermittent.  Tonight it scared her because the pain radiated to the left side of her neck.  Took 324 mg baby aspirin prior to arrival.  Denies fever, cough, chest pain, shortness of breath, abdominal pain, nausea, vomiting or dizziness.  History of thyroid and lung cancer.  Denies breast deformity or discharge.  Denies swelling in her axilla.  Denies recent travel, trauma or hormone use.     Past Medical History:  Diagnosis Date  . 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 GERD (gastroesophageal reflux disease)   . GIB (gastrointestinal bleeding)   . Groin pain    Along superior/laterl R inguinal canal.  Could be due to hernia or old scar tissue.  prev with CT done w/o alarming findings.  Will treat episodically.  Use tylenol #3 prn.    . Hyperlipidemia   . Lipoma of thigh    Left  . Normal cardiac stress test 2014  . PONV (postoperative nausea and vomiting)    nausea after hysterectomy, and 2 days after lung surgery 10/2015  . Thyroid cancer ([place]) 12/2015  . Thyroid nodule     Patient Active Problem List   Diagnosis Date Noted  . Other social stressor 10/21/2020  . Hypothyroidism, unspecified 07/02/2020  . Wart 07/02/2020  . Dermatitis 02/20/2020  . Stress reaction 02/20/2020  . Adenomatous polyp of ascending colon   . Polyp of transverse colon   . Gastric polyp   . Stomach  irritation   . Healthcare maintenance 06/30/2019  . GI bleed 05/17/2019  . Upper back pain 04/14/2018  . Aortic atherosclerosis ([place]) 05/31/2017  . Breast pain 03/25/2017  . Cancer of lower lobe of right lung ([place]) 05/06/2016  . Thyroid cancer ([place]) 09/11/2015  . Adenopathy   . Advance care planning 05/14/2014  . Thrombocytopenia ([place]) 07/03/2013  . Osteopenia 05/05/2013  . Medicare annual wellness visit, subsequent 04/26/2012  . GERD (gastroesophageal reflux disease) 04/26/2012  . Abdominal discomfort 01/12/2012  . Essential hypertension 11/17/2010  . HLD (hyperlipidemia) 01/31/2008  . 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 ABDOMINAL HYSTERECTOMY    . BREAST EXCISIONAL BIOPSY Right 1977  . BREAST MASS EXCISION  1977   benign  . CARPAL TUNNEL RELEASE Right 1978  . CATARACT EXTRACTION, BILATERAL Bilateral   . CHOLECYSTECTOMY  11/23/2016   Procedure: LAPAROSCOPIC CHOLECYSTECTOMY;  Surgeon: Clayburn Pert, MD;  Location: [facility] ORS;  Service: General;;  . COLONOSCOPY  2009  . COLONOSCOPY WITH PROPOFOL N/A 05/18/2019   Procedure: COLONOSCOPY WITH PROPOFOL;  Surgeon: Virgel Manifold, MD;  Location: [facility] ENDOSCOPY;  Service: Endoscopy;  Laterality: N/A;  . COLONOSCOPY WITH PROPOFOL N/A 08/15/2019   Procedure: COLONOSCOPY WITH PROPOFOL;  Surgeon: Virgel Manifold, MD;  Location: [facility] ENDOSCOPY;  Service: Endoscopy;  Laterality: N/A;  . DILATION AND CURETTAGE OF UTERUS  2001  .  ELECTROMAGNETIC NAVIGATION BROCHOSCOPY N/A 08/25/2015   Procedure: ELECTROMAGNETIC NAVIGATION BRONCHOSCOPY;  Surgeon: Flora Lipps, MD;  Location: [facility] ORS;  Service: Cardiopulmonary;  Laterality: N/A;  . ENDOBRONCHIAL ULTRASOUND N/A 08/25/2015   Procedure: ENDOBRONCHIAL ULTRASOUND;  Surgeon: Flora Lipps, MD;  Location: [facility] ORS;  Service: Cardiopulmonary;  Laterality: N/A;  . ESOPHAGOGASTRODUODENOSCOPY  2002, 11/15/07   Normal (Dr.  Allyn Kenner)  . ESOPHAGOGASTRODUODENOSCOPY (EGD) WITH PROPOFOL N/A 05/18/2019   Procedure: ESOPHAGOGASTRODUODENOSCOPY (EGD) WITH PROPOFOL;  Surgeon: Virgel Manifold, MD;  Location: [facility];  Service: Endoscopy;  Laterality: N/A;  . ESOPHAGOGASTRODUODENOSCOPY (EGD) WITH PROPOFOL N/A 08/15/2019   Procedure: ESOPHAGOGASTRODUODENOSCOPY (EGD) WITH PROPOFOL;  Surgeon: Virgel Manifold, MD;  Location: [facility];  Service: Endoscopy;  Laterality: N/A;  . FACIAL COSMETIC SURGERY  01/13/10   mini facelift  . HERNIA REPAIR  1976  . OOPHORECTOMY    . THORACOTOMY/LOBECTOMY Right 10/27/2015   Procedure: THORACOTOMY/LOBECTOMY;  Surgeon: Nestor Lewandowsky, MD;  Location: [facility];  Service: Thoracic;  Laterality: Right;  . THYROIDECTOMY N/A 12/29/2015   Procedure: THYROIDECTOMY;  Surgeon: Beverly Gust, MD;  Location: [facility] OR[facility];  Service: ENT;  Laterality: N/A;  . TOTAL VAGINAL HYSTERECTOMY  12/30/08   for fibroid pain (Dr. Gertie Fey)    Prior to Admission medications   Medication Sig Start Date End Date Taking? Authorizing Provider  acetaminophen (TYLENOL) 500 MG tablet Take 1,000 mg by mouth every 6 (six) hours as needed for mild pain, moderate pain or headache.    [provider]  Ascorbic Acid (VITAMIN C) 500 MG tablet Take 2,000 mg by mouth daily. Reported on 02/04/2016    [provider]  aspirin EC 81 MG tablet Take 1 tablet (81 mg total) by mouth daily. 11/29/19   Tonia Ghent, MD  Biotin 1000 MCG tablet Take 1,000 mcg by mouth daily. Reported on 02/04/2016    [provider]  CALCIUM-MAG-VIT C-VIT D PO Take by mouth daily.    [provider]  Cholecalciferol (VITAMIN D-3) 1000 units CAPS Take 1 capsule by mouth daily with supper.     [provider]  Coenzyme Q10 200 MG capsule Take 200 mg by mouth daily.    [provider]  Folic Acid-Vit R7-EYC X44 (FA-VITAMIN B-6-VITAMIN B-12 PO) Take 1 tablet by mouth daily after breakfast. Reported on  02/04/2016    [provider]  levothyroxine (SYNTHROID) 88 MCG tablet TAKE 1 TABLET (88 MCG TOTAL) BY MOUTH DAILY BEFORE BREAKFAST. 10/09/20   Cammie Sickle, MD  Lutein 20 MG CAPS Take 20 mg by mouth daily.    [provider]  Omega-3 Fatty Acids (SUPER OMEGA 3 EPA/DHA) 1000 MG CAPS Take 1,000 mg by mouth daily with supper.    [provider]  omeprazole (PRILOSEC) 40 MG capsule TAKE 1 CAPSULE BY MOUTH EVERY DAY 11/19/20   Tonia Ghent, MD  Potassium Gluconate 595 MG CAPS Take 595 mg by mouth daily.     [provider]  pravastatin (PRAVACHOL) 40 MG tablet TAKE 1 TABLET BY MOUTH EVERY DAY 07/01/20   Tonia Ghent, MD  Calcium Carbonate (CALCIUM 500 PO) Take 1 tablet by mouth every morning.  06/29/20  [provider]  calcium gluconate 500 MG tablet Take 500 mg by mouth daily.    06/29/20  [provider]  metoprolol succinate (TOPROL-XL) 25 MG 24 hr tablet Take 25 mg by mouth at bedtime.  06/29/20  [provider]  simvastatin (ZOCOR) 40 MG tablet Take 40 mg  by mouth at bedtime.  06/29/20  [provider]    Allergies Atorvastatin, Ciprofloxacin, Epinephrine, Metoprolol succinate, Simvastatin, and Sulfa antibiotics  Family History  Problem Relation Age of Onset  . Transient ischemic attack Mother   . Hypertension Mother   . Stroke Mother        mini strokes  . Hypertension Sister   . Cancer Sister        ovarian  . Hypertension Sister   . Hypertension Sister   . Hypertension Sister   . Breast cancer Neg Hx   . Colon cancer Neg Hx     Social History Social History   Tobacco Use  . Smoking status: Never Smoker  . Smokeless tobacco: Never Used  Vaping Use  . Vaping Use: Never used  Substance Use Topics  . Alcohol use: No    Alcohol/week: 0.0 standard drinks  . Drug use: No    Review of Systems  Constitutional: No fever/chills Eyes: No visual changes. ENT: No sore throat. Cardiovascular:  Positive for left breast pain.  Denies chest pain. Respiratory: Denies shortness of breath. Gastrointestinal: No abdominal pain.  No nausea, no vomiting.  No diarrhea.  No constipation. Genitourinary: Negative for dysuria. Musculoskeletal: Negative for back pain. Skin: Negative for rash. Neurological: Negative for headaches, focal weakness or numbness.   ____________________________________________   PHYSICAL EXAM:  VITAL SIGNS: ED Triage Vitals [03/01/21 0320]  Enc Vitals Group     BP      Pulse      Resp      Temp      Temp src      SpO2 99 %     Weight      Height      Head Circumference      Peak Flow      Pain Score      Pain Loc      Pain Edu?      Excl. in [place]?     Constitutional: Alert and oriented. Well appearing and in no acute distress. Eyes: Conjunctivae are normal. PERRL. EOMI. Head: Atraumatic. Nose: No congestion/rhinnorhea. Mouth/Throat: Mucous membranes are moist.   Neck: No stridor.   Cardiovascular: Normal rate, regular rhythm. Grossly normal heart sounds.  Good peripheral circulation. Respiratory: Normal respiratory effort.  No retractions. Lungs CTAB. Breasts: No palpable masses to left breast.  Breast is not dimpled or deformed.  There is no nipple discharge.  There is no axillary lymphadenopathy. Gastrointestinal: Soft and nontender to light or deep palpation. No distention. No abdominal bruits. No CVA tenderness. Musculoskeletal: No lower extremity tenderness nor edema.  No joint effusions. Neurologic:  Normal speech and language. No gross focal neurologic deficits are appreciated. No gait instability. Skin:  Skin is warm, dry and intact. No rash noted. Psychiatric: Mood and affect are normal. Speech and behavior are normal.  ____________________________________________   LABS (all labs ordered are listed, but only abnormal results are displayed)  Labs Reviewed  CBC WITH DIFFERENTIAL/PLATELET - Abnormal; Notable for the following  components:      Result Value   Platelets 146 (*)    All other components within normal limits  COMPREHENSIVE METABOLIC PANEL - Abnormal; Notable for the following components:   Glucose, Bld 114 (*)    Total Protein 6.4 (*)    All other components within normal limits  LIPASE, BLOOD  D-DIMER, QUANTITATIVE  TROPONIN I (HIGH SENSITIVITY)  TROPONIN I (HIGH SENSITIVITY)   ____________________________________________  EKG  ED ECG REPORT I,  Keeana Pieratt J, the attending physician, personally viewed and interpreted this ECG.   Date: 03/01/2021  EKG Time: 0323  Rate: 77  Rhythm: normal EKG, normal sinus rhythm  Axis: Normal  Intervals:none  ST&T Change: Nonspecific  ____________________________________________  RADIOLOGY I, Damon Hargrove J, personally viewed and evaluated these images (plain radiographs) as part of my medical decision making, as well as reviewing the written report by the radiologist.  ED MD interpretation:  No acute cardiopulmonary  Official radiology report(s): DG Chest Port 1 View  Result Date: 03/01/2021 CLINICAL DATA:  Left-sided chest pain. EXAM: PORTABLE CHEST 1 VIEW COMPARISON:  January 05, 2019 FINDINGS: Mild, diffuse, chronic appearing increased interstitial lung markings are seen. Mild, stable right-sided volume loss is noted secondary to prior right lower lobectomy. There is no evidence of a pleural effusion or pneumothorax. The heart size and mediastinal contours are within normal limits. There is mild calcification of the aortic arch. The visualized skeletal structures are unremarkable. IMPRESSION: Stable exam without acute cardiopulmonary disease. Electronically Signed   By: Virgina Norfolk M.D.   On: 03/01/2021 04:18    ____________________________________________   PROCEDURES  Procedure(s) performed (including Critical Care):  .1-3 Lead EKG Interpretation Performed by: Paulette Blanch, MD Authorized by: Paulette Blanch, MD     Interpretation: normal      ECG rate:  75   ECG rate assessment: normal     Rhythm: sinus rhythm     Ectopy: none     Conduction: normal   Comments:     Patient placed on cardiac monitor to evaluate for arrhythmias     ____________________________________________   INITIAL IMPRESSION / ASSESSMENT AND PLAN / ED COURSE  As part of my medical decision making, I reviewed the following data within the electronic MEDICAL RECORD NUMBER History obtained from family, Nursing notes reviewed and incorporated, Labs reviewed, EKG interpreted, Old chart reviewed, Radiograph reviewed and Notes from prior ED visits     75 year old female presenting with left breast pain radiating to her neck. Differential diagnosis includes, but is not limited to, ACS, aortic dissection, pulmonary embolism, cardiac tamponade, pneumothorax, pneumonia, pericarditis, myocarditis, GI-related causes including esophagitis/gastritis, and musculoskeletal chest wall pain.    Patient is scheduled for mammogram today.  We will obtain cardiac work-up including D-dimer, chest x-ray.  Patient currently resting in no acute distress.  Will reassess.  Clinical Course as of 03/01/21 0701  Mon Mar 01, 2021  1950 Initial troponin and D-dimer unremarkable.  Awaiting repeat troponin.  Patient resting in no acute distress. [JS]  9326 Repeat troponin negative.  Patient will keep her mammogram appointment this afternoon.  Strict return precautions given.  Patient verbalizes understanding agrees with plan of care. [JS]    Clinical Course User Index [JS] Paulette Blanch, MD     ____________________________________________   FINAL CLINICAL IMPRESSION(S) / ED DIAGNOSES  Final diagnoses:  Breast pain     ED Discharge Orders    None      *Please note:  [name] was evaluated in Emergency Department on 03/01/2021 for the symptoms described in the history of present illness. She was evaluated in the context of the global COVID-19 pandemic, which necessitated  consideration that the patient might be at risk for infection with the SARS-CoV-2 virus that causes COVID-19. Institutional protocols and algorithms that pertain to the evaluation of patients at risk for COVID-19 are in a state of rapid change based on information released by regulatory bodies including the CDC and federal and state  organizations. These policies and algorithms were followed during the patient's care in the ED.  Some ED evaluations and interventions may be delayed as a result of limited staffing during and the pandemic.*   Note:  This document was prepared using Dragon voice recognition software and may include unintentional dictation errors.   Paulette Blanch, MD 03/01/21 [phone]

## 2021-03-01 NOTE — Discharge Instructions (Addendum)
Please keep your mammogram appointment this afternoon.  Return to the ER for worsening symptoms, persistent vomiting, difficulty breathing or other concerns.

## 2021-03-01 NOTE — ED Triage Notes (Signed)
EMS brought in from home for left sided breast pain that has been ongoing since January. Had an appointment for a mammogram today but started having radiating pain to left side of neck. Denies discharge from breast. No redness/swelling noted to same. Denies SOB. Took ASA 324mg  PO pta. AAOx4.

## 2021-03-04 ENCOUNTER — Other Ambulatory Visit: Payer: Self-pay

## 2021-03-04 ENCOUNTER — Ambulatory Visit (INDEPENDENT_AMBULATORY_CARE_PROVIDER_SITE_OTHER): Payer: PPO | Admitting: Family Medicine

## 2021-03-04 ENCOUNTER — Encounter: Payer: Self-pay | Admitting: Family Medicine

## 2021-03-04 DIAGNOSIS — N644 Mastodynia: Secondary | ICD-10-CM | POA: Diagnosis not present

## 2021-03-04 NOTE — Patient Instructions (Addendum)
Try taking omeprazole twice a day for another week.  Let me see about options on the breast pain in the meantime.  Try a different sports bra in the meantime.   Take care.  Glad to see you.

## 2021-03-04 NOTE — Progress Notes (Signed)
This visit occurred during the SARS-CoV-2 public health emergency.  Safety protocols were in place, including screening questions prior to the visit, additional usage of staff PPE, and extensive cleaning of exam room while observing appropriate contact time as indicated for disinfecting solutions.  Sx started a few days after covid booster a few months ago (breast pain and eye vs eyelid twitching).  To ER with a different breast/chest pain recently.  Mammogram neg, CXR neg.  Troponin and d dimer were neg.  No caffeine use.  Hasn't noted any masses in the axilla.  Tylenol helps some with the breast discomfort.  No breast skin changes.  No lumps or masses noted by patient.  No nsaid use.  Changing bras didn't help.   Prev gas and bloating improved in the meantime.  Not totally resolved, but less pronounced now.  She has R upper back/scapular area pain after eating.  H/o cholecystectomy.  She goes to sliver sneakers w/o CP.  She can [place] mile at the mall.    She had negative mammogram in the meantime.  Meds, vitals, and allergies reviewed.   ROS: Per HPI unless specifically indicated in ROS section   GEN: nad, alert and oriented HEENT:ncat NECK: supple w/o LA CV: rrr.   PULM: ctab, no inc wob ABD: soft, +bs EXT: no edema SKIN: Well-perfused. No axillary lymphadenopathy bilaterally.

## 2021-03-07 NOTE — Assessment & Plan Note (Signed)
There are several issues here.  She has had bilateral breast pain since having previous COVID-vaccine.  Unclear if those 2 are related.  That seems to be separate from the recent symptoms that she had that led to the emergency room evaluation.  Her previous right shoulder pain, left breast pain, and bloating could all have been GERD related and separate from her previous several months of bilateral breast pain.  I think it makes sense to treat her preemptively for GERD with omeprazole twice a day in the meantime and see if those other symptoms listed above improve and then have her report back.  See after visit summary.  She agrees.

## 2021-03-11 ENCOUNTER — Telehealth: Payer: Self-pay

## 2021-03-11 NOTE — Telephone Encounter (Signed)
Spoke with the patient. She is aware of Dr. Josefine Class message. Nothing further needed.

## 2021-03-11 NOTE — Telephone Encounter (Signed)
Pt had OV last week for breast pain. The pain has actually gotten better. Also the pain between her shoulder blades has gotten a little better with the increase in the omeprazole. She is asking if she should continue taking the omeprazole. And she thought Dr Damita Dunnings was going to look in to the breast pain being related to the covid vaccine. She was wondering what he may have found out. Please advise at [phone].

## 2021-03-11 NOTE — Telephone Encounter (Signed)
I did some checking but couldn't find significant data about bilateral breast pain after vaccination.    I am glad she is better.  Given the improvement, I would give it a little more time and continue the higher dose of omeprazole for another 2 weeks.  Then cut back to her prev dosing.  If she needs a new rx, let me know.  Thanks.

## 2021-04-30 DIAGNOSIS — H40013 Open angle with borderline findings, low risk, bilateral: Secondary | ICD-10-CM | POA: Diagnosis not present

## 2021-04-30 DIAGNOSIS — H04123 Dry eye syndrome of bilateral lacrimal glands: Secondary | ICD-10-CM | POA: Diagnosis not present

## 2021-04-30 DIAGNOSIS — H5213 Myopia, bilateral: Secondary | ICD-10-CM | POA: Diagnosis not present

## 2021-04-30 DIAGNOSIS — H02403 Unspecified ptosis of bilateral eyelids: Secondary | ICD-10-CM | POA: Diagnosis not present

## 2021-05-13 ENCOUNTER — Inpatient Hospital Stay: Payer: PPO | Attending: Internal Medicine

## 2021-05-13 DIAGNOSIS — Z79899 Other long term (current) drug therapy: Secondary | ICD-10-CM | POA: Insufficient documentation

## 2021-05-13 DIAGNOSIS — M8580 Other specified disorders of bone density and structure, unspecified site: Secondary | ICD-10-CM | POA: Diagnosis not present

## 2021-05-13 DIAGNOSIS — Z85118 Personal history of other malignant neoplasm of bronchus and lung: Secondary | ICD-10-CM | POA: Diagnosis not present

## 2021-05-13 DIAGNOSIS — Z85850 Personal history of malignant neoplasm of thyroid: Secondary | ICD-10-CM | POA: Insufficient documentation

## 2021-05-13 DIAGNOSIS — C3431 Malignant neoplasm of lower lobe, right bronchus or lung: Secondary | ICD-10-CM

## 2021-05-13 LAB — COMPREHENSIVE METABOLIC PANEL
ALT: 26 U/L (ref 0–44)
AST: 21 U/L (ref 15–41)
Albumin: 3.9 g/dL (ref 3.5–5.0)
Alkaline Phosphatase: 51 U/L (ref 38–126)
Anion gap: 6 (ref 5–15)
BUN: 21 mg/dL (ref 8–23)
CO2: 28 mmol/L (ref 22–32)
Calcium: 9 mg/dL (ref 8.9–10.3)
Chloride: 104 mmol/L (ref 98–111)
Creatinine, Ser: 0.59 mg/dL (ref 0.44–1.00)
GFR, Estimated: >60 mL/min (ref >60)
Glucose, Bld: 108 mg/dL — ABNORMAL HIGH (ref 70–99)
Potassium: 4.4 mmol/L (ref 3.5–5.1)
Sodium: 138 mmol/L (ref 135–145)
Total Bilirubin: 0.9 mg/dL (ref 0.3–1.2)
Total Protein: 6.5 g/dL (ref 6.5–8.1)

## 2021-05-13 LAB — CBC WITH DIFFERENTIAL/PLATELET
Abs Immature Granulocytes: 0.02 10*3/uL (ref 0.00–0.07)
Basophils Absolute: 0.1 10*3/uL (ref 0.0–0.1)
Basophils Relative: 1 %
Eosinophils Absolute: 0.1 10*3/uL (ref 0.0–0.5)
Eosinophils Relative: 1 %
HCT: 39.4 % (ref 36.0–46.0)
Hemoglobin: 12.9 g/dL (ref 12.0–15.0)
Immature Granulocytes: 0 %
Lymphocytes Relative: 26 %
Lymphs Abs: 1.6 10*3/uL (ref 0.7–4.0)
MCH: 31.2 pg (ref 26.0–34.0)
MCHC: 32.7 g/dL (ref 30.0–36.0)
MCV: 95.2 fL (ref 80.0–100.0)
Monocytes Absolute: 0.4 10*3/uL (ref 0.1–1.0)
Monocytes Relative: 7 %
Neutro Abs: 3.9 10*3/uL (ref 1.7–7.7)
Neutrophils Relative %: 65 %
Platelets: 146 10*3/uL — ABNORMAL LOW (ref 150–400)
RBC: 4.14 MIL/uL (ref 3.87–5.11)
RDW: 14.3 % (ref 11.5–15.5)
WBC: 6 10*3/uL (ref 4.0–10.5)
nRBC: 0 % (ref 0.0–0.2)

## 2021-05-14 ENCOUNTER — Other Ambulatory Visit: Payer: Self-pay | Admitting: Family Medicine

## 2021-05-14 LAB — THYROID PANEL WITH TSH
Free Thyroxine Index: 2.4 (ref 1.2–4.9)
T3 Uptake Ratio: 29 % (ref 24–39)
T4, Total: 8.2 ug/dL (ref 4.5–12.0)
TSH: 0.351 u[IU]/mL — ABNORMAL LOW (ref 0.450–4.500)

## 2021-05-17 ENCOUNTER — Other Ambulatory Visit: Payer: Self-pay

## 2021-05-17 ENCOUNTER — Ambulatory Visit
Admission: RE | Admit: 2021-05-17 | Discharge: 2021-05-17 | Disposition: A | Discharge status: Home / Self Care | Payer: PPO | Source: Ambulatory Visit | Attending: Internal Medicine | Admitting: Internal Medicine

## 2021-05-17 DIAGNOSIS — R911 Solitary pulmonary nodule: Secondary | ICD-10-CM | POA: Diagnosis not present

## 2021-05-17 DIAGNOSIS — C3431 Malignant neoplasm of lower lobe, right bronchus or lung: Secondary | ICD-10-CM | POA: Diagnosis not present

## 2021-05-17 DIAGNOSIS — I700 Atherosclerosis of aorta: Secondary | ICD-10-CM | POA: Diagnosis not present

## 2021-05-17 MED ORDER — IOHEXOL 350 MG/ML SOLN
50.0000 mL | Freq: Once | INTRAVENOUS | Status: AC | PRN
  Administered 2021-05-17: 50 mL via INTRAVENOUS

## 2021-05-21 ENCOUNTER — Inpatient Hospital Stay: Payer: PPO | Admitting: Internal Medicine

## 2021-05-21 ENCOUNTER — Other Ambulatory Visit: Payer: Self-pay

## 2021-05-21 ENCOUNTER — Encounter: Payer: Self-pay | Admitting: Internal Medicine

## 2021-05-21 DIAGNOSIS — Z85118 Personal history of other malignant neoplasm of bronchus and lung: Secondary | ICD-10-CM | POA: Diagnosis not present

## 2021-05-21 DIAGNOSIS — C3431 Malignant neoplasm of lower lobe, right bronchus or lung: Secondary | ICD-10-CM

## 2021-05-21 NOTE — Progress Notes (Signed)
[place] OFFICE PROGRESS NOTE  Patient Care Team: Tonia Ghent, MD as PCP - General (Family Medicine) Kate Sable, MD as PCP - Cardiology (Cardiology) Beverly Gust, MD as Consulting Physician (Otolaryngology) Nestor Lewandowsky, MD (Inactive) as Consulting Physician (Cardiothoracic Surgery) Cammie Sickle, MD as Consulting Physician (Internal Medicine) Birder Robson, MD as Consulting Physician (Ophthalmology) Clayburn Pert, MD as Consulting Physician (General Surgery)  Cancer Staging Thyroid cancer [facility]) Staging form: Thyroid - Papillary or Follicular (Under 45 years), AJCC 7th Edition - Clinical: Stage I (T1, N0, M0) - Signed by Forest Gleason, MD on 09/11/2015 Laterality: Right    Oncology History Overview Note  # NOV 2016- RLL [place] ca T1N0 [incidental; Dr.Simonds; Bronc- s/p RLlobectomy; Dr.Oaks;Dec 2016]  # FEB 2017- 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 STAGE I; s/p Total Thyroidetcomy [Dr.Chapman];NO RAIU.  LOW RISK; but detectable thyroglobulin- on Synthroid  #Survivorship-pending  DIAGNOSIS: Lung cancer -stage I #thyroid cancer low risk  GOALS: Cure  CURRENT/MOST RECENT THERAPY: Surveillance    Thyroid cancer ([place])  Cancer of lower lobe of right lung ([place])  05/06/2016 Initial Diagnosis   Malignant neoplasm of lower lobe, right bronchus or lung (HCC)     INTERVAL HISTORY:  [name] 75 y.o.  female pleasant patient above history of stage I lung cancer and also stage I thyroid cancer currently on surveillance is here for follow-up/review results of CT scan.  Patient denies any fever or chills or cough.  Denies any nausea vomiting abdominal pain.  Denies any headaches.  Complains of fatigue.   Review of Systems  Constitutional:  Positive for malaise/fatigue. Negative for chills, diaphoresis, fever and weight loss.  HENT:  Negative for nosebleeds and  sore throat.   Eyes:  Negative for double vision.  Respiratory:  Negative for cough, hemoptysis, sputum production, shortness of breath and wheezing.   Cardiovascular:  Negative for chest pain, palpitations, orthopnea and leg swelling.  Gastrointestinal:  Negative for abdominal pain, blood in stool, constipation, diarrhea, heartburn, melena and vomiting.  Genitourinary:  Negative for dysuria, frequency and urgency.  Musculoskeletal:  Positive for back pain. Negative for joint pain.  Skin: Negative.  Negative for itching and rash.  Neurological:  Positive for tingling. Negative for dizziness, focal weakness, weakness and headaches.  Endo/Heme/Allergies:  Does not bruise/bleed easily.  Psychiatric/Behavioral:  Negative for depression. The patient is not nervous/anxious and does not have insomnia.      PAST MEDICAL HISTORY :  Past Medical History:  Diagnosis Date  . Adenocarcinoma of right lung ([place]) 10/01/2015  . GERD (gastroesophageal reflux disease)   . GIB (gastrointestinal bleeding)   . Groin pain    Along superior/laterl R inguinal canal.  Could be due to hernia or old scar tissue.  prev with CT done w/o alarming findings.  Will treat episodically.  Use tylenol #3 prn.    . Hyperlipidemia   . Lipoma of thigh    Left  . Normal cardiac stress test 2014  . PONV (postoperative nausea and vomiting)    nausea after hysterectomy, and 2 days after lung surgery 10/2015  . Thyroid cancer ([place]) 12/2015  . Thyroid nodule     PAST SURGICAL HISTORY :   Past Surgical History:  Procedure Laterality Date  . ABDOMINAL HYSTERECTOMY    . BREAST EXCISIONAL BIOPSY Right 1977  . BREAST MASS EXCISION  1977   benign  . CARPAL TUNNEL RELEASE Right 1978  . CATARACT  EXTRACTION, BILATERAL Bilateral   . CHOLECYSTECTOMY  11/23/2016   Procedure: LAPAROSCOPIC CHOLECYSTECTOMY;  Surgeon: Clayburn Pert, MD;  Location: [facility] ORS;  Service: General;;  . COLONOSCOPY  2009  . COLONOSCOPY WITH PROPOFOL N/A  05/18/2019   Procedure: COLONOSCOPY WITH PROPOFOL;  Surgeon: Virgel Manifold, MD;  Location: [facility] END[facility];  Service: Endoscopy;  Laterality: N/A;  . COLONOSCOPY WITH PROPOFOL N/A 08/15/2019   Procedure: COLONOSCOPY WITH PROPOFOL;  Surgeon: Virgel Manifold, MD;  Location: [facility] END[facility];  Service: Endoscopy;  Laterality: N/A;  . DILATION AND CURETTAGE OF UTERUS  2001  . ELECTROMAGNETIC NAVIGATION BROCHOSCOPY N/A 08/25/2015   Procedure: ELECTROMAGNETIC NAVIGATION BRONCHOSCOPY;  Surgeon: Flora Lipps, MD;  Location: [facility] OR[facility];  Service: Cardiopulmonary;  Laterality: N/A;  . ENDOBRONCHIAL ULTRASOUND N/A 08/25/2015   Procedure: ENDOBRONCHIAL ULTRASOUND;  Surgeon: Flora Lipps, MD;  Location: [facility] OR[facility];  Service: Cardiopulmonary;  Laterality: N/A;  . ESOPHAGOGASTRODUODENOSCOPY  2002, 11/15/07   Normal (Dr. Allyn Kenner)  . ESOPHAGOGASTRODUODENOSCOPY (EGD) WITH PROPOFOL N/A 05/18/2019   Procedure: ESOPHAGOGASTRODUODENOSCOPY (EGD) WITH PROPOFOL;  Surgeon: Virgel Manifold, MD;  Location: [facility];  Service: Endoscopy;  Laterality: N/A;  . ESOPHAGOGASTRODUODENOSCOPY (EGD) WITH PROPOFOL N/A 08/15/2019   Procedure: ESOPHAGOGASTRODUODENOSCOPY (EGD) WITH PROPOFOL;  Surgeon: Virgel Manifold, MD;  Location: [facility];  Service: Endoscopy;  Laterality: N/A;  . FACIAL COSMETIC SURGERY  01/13/10   mini facelift  . HERNIA REPAIR  1976  . OOPHORECTOMY    . THORACOTOMY/LOBECTOMY Right 10/27/2015   Procedure: THORACOTOMY/LOBECTOMY;  Surgeon: Nestor Lewandowsky, MD;  Location: [facility];  Service: Thoracic;  Laterality: Right;  . THYROIDECTOMY N/A 12/29/2015   Procedure: THYROIDECTOMY;  Surgeon: Beverly Gust, MD;  Location: [facility];  Service: ENT;  Laterality: N/A;  . TOTAL VAGINAL HYSTERECTOMY  12/30/08   for fibroid pain (Dr. Gertie Fey)    FAMILY HISTORY :   Family History  Problem Relation Age of Onset  . Transient ischemic attack Mother   . Hypertension Mother   . Stroke Mother        mini  strokes  . Hypertension Sister   . Cancer Sister        ovarian  . Hypertension Sister   . Hypertension Sister   . Hypertension Sister   . Breast cancer Neg Hx   . Colon cancer Neg Hx     SOCIAL HISTORY:   Social History   Tobacco Use  . Smoking status: Never  . Smokeless tobacco: Never  Vaping Use  . Vaping Use: Never used  Substance Use Topics  . Alcohol use: No    Alcohol/week: 0.0 standard drinks  . Drug use: No    ALLERGIES:  is allergic to atorvastatin, ciprofloxacin, epinephrine, metoprolol succinate, simvastatin, and sulfa antibiotics.  MEDICATIONS:  Current Outpatient Medications  Medication Sig Dispense Refill  . acetaminophen (TYLENOL) 500 MG tablet Take 1,000 mg by mouth every 6 (six) hours as needed for mild pain, moderate pain or headache.    . Ascorbic Acid (VITAMIN C) 500 MG tablet Take 2,000 mg by mouth daily. Reported on 02/04/2016    . aspirin EC 81 MG tablet Take 1 tablet (81 mg total) by mouth daily.    . Biotin 1000 MCG tablet Take 1,000 mcg by mouth daily. Reported on 02/04/2016    . CALCIUM-MAG-VIT C-VIT D PO Take by mouth daily.    . Cholecalciferol (VITAMIN D-3) 1000 units CAPS Take 1 capsule by mouth daily with supper.     . Coenzyme Q10 200 MG capsule Take  200 mg by mouth daily.    . Folic Acid-Vit L8-GTX M46 (FA-VITAMIN B-6-VITAMIN B-12 PO) Take 1 tablet by mouth daily after breakfast. Reported on 02/04/2016    . levothyroxine (SYNTHROID) 88 MCG tablet TAKE 1 TABLET (88 MCG TOTAL) BY MOUTH DAILY BEFORE BREAKFAST. 90 tablet 3  . Lutein 20 MG CAPS Take 20 mg by mouth daily.    . Omega-3 Fatty Acids (SUPER OMEGA 3 EPA/DHA) 1000 MG CAPS Take 1,000 mg by mouth daily with supper.    Marland Kitchen omeprazole (PRILOSEC) 40 MG capsule TAKE 1 CAPSULE BY MOUTH EVERY DAY 90 capsule 1  . Potassium Gluconate 595 MG CAPS Take 595 mg by mouth daily.     . pravastatin (PRAVACHOL) 40 MG tablet TAKE 1 TABLET BY MOUTH EVERY DAY 90 tablet 3   No current facility-administered  medications for this visit.    PHYSICAL EXAMINATION: ECOG PERFORMANCE STATUS: 1 - Symptomatic but completely ambulatory  BP 137/76 (BP Location: Left Arm, Patient Position: Sitting, Cuff Size: Normal)   Pulse 87   Temp 98 F (36.7 C) (Tympanic)   Resp 16   Ht 5\' 5"  (1.651 m)   Wt 127 lb 12.8 oz (58 kg)   SpO2 100%   BMI 21.27 kg/m   Filed Weights   05/21/21 1040  Weight: 127 lb 12.8 oz (58 kg)    Physical Exam HENT:     Head: Normocephalic and atraumatic.     Mouth/Throat:     Pharynx: No oropharyngeal exudate.  Eyes:     Pupils: Pupils are equal, round, and reactive to light.  Cardiovascular:     Rate and Rhythm: Normal rate and regular rhythm.  Pulmonary:     Effort: No respiratory distress.     Breath sounds: No wheezing.  Abdominal:     General: Bowel sounds are normal. There is no distension.     Palpations: Abdomen is soft. There is no mass.     Tenderness: no abdominal tenderness There is no guarding or rebound.  Musculoskeletal:        General: No tenderness. Normal range of motion.     Cervical back: Normal range of motion and neck supple.  Skin:    General: Skin is warm.  Neurological:     Mental Status: She is alert and oriented to person, place, and time.  Psychiatric:        Mood and Affect: Affect normal.   LABORATORY DATA:  I have reviewed the data as listed    Component Value Date/Time   NA 138 05/13/2021 1124   NA 141 07/01/2013 0103   K 4.4 05/13/2021 1124   K 3.8 07/01/2013 0103   CL 104 05/13/2021 1124   CL 110 (H) 07/01/2013 0103   CO2 28 05/13/2021 1124   CO2 27 07/01/2013 0103   GLUCOSE 108 (H) 05/13/2021 1124   GLUCOSE 88 07/01/2013 0103   BUN 21 05/13/2021 1124   BUN 11 07/01/2013 0103   CREATININE 0.59 05/13/2021 1124   CREATININE 0.68 07/01/2013 0103   CALCIUM 9.0 05/13/2021 1124   CALCIUM 8.2 (L) 07/01/2013 0103   PROT 6.5 05/13/2021 1124   PROT 5.5 (L) 07/01/2013 0103   ALBUMIN 3.9 05/13/2021 1124   ALBUMIN 3.0 (L)  07/01/2013 0103   AST 21 05/13/2021 1124   AST 19 07/01/2013 0103   ALT 26 05/13/2021 1124   ALT 24 07/01/2013 0103   ALKPHOS 51 05/13/2021 1124   ALKPHOS 55 07/01/2013 0103   BILITOT 0.9  05/13/2021 1124   BILITOT 0.7 07/01/2013 0103   GFRNONAA >60 05/13/2021 1124   GFRNONAA >60 07/01/2013 0103   GFRAA >60 05/19/2019 0117   GFRAA >60 07/01/2013 0103    No results found for: SPEP, UPEP  Lab Results  Component Value Date   WBC 6.0 05/13/2021   NEUTROABS 3.9 05/13/2021   HGB 12.9 05/13/2021   HCT 39.4 05/13/2021   MCV 95.2 05/13/2021   PLT 146 (L) 05/13/2021      Chemistry      Component Value Date/Time   NA 138 05/13/2021 1124   NA 141 07/01/2013 0103   K 4.4 05/13/2021 1124   K 3.8 07/01/2013 0103   CL 104 05/13/2021 1124   CL 110 (H) 07/01/2013 0103   CO2 28 05/13/2021 1124   CO2 27 07/01/2013 0103   BUN 21 05/13/2021 1124   BUN 11 07/01/2013 0103   CREATININE 0.59 05/13/2021 1124   CREATININE 0.68 07/01/2013 0103      Component Value Date/Time   CALCIUM 9.0 05/13/2021 1124   CALCIUM 8.2 (L) 07/01/2013 0103   ALKPHOS 51 05/13/2021 1124   ALKPHOS 55 07/01/2013 0103   AST 21 05/13/2021 1124   AST 19 07/01/2013 0103   ALT 26 05/13/2021 1124   ALT 24 07/01/2013 0103   BILITOT 0.9 05/13/2021 1124   BILITOT 0.7 07/01/2013 0103       RADIOGRAPHIC STUDIES: I have personally reviewed the radiological images as listed and agreed with the findings in the report. No results found.   ASSESSMENT & PLAN:  Cancer of lower lobe of right lung ([place]) # RLL Lung ca; Stage I;  no adjuvant therapy. July 2022-- CT scan- STABLE; Status post right lower lobectomy. No evidence of recurrent or metastatic disease.   #July 2022 CT scan:  stable 9 mm sub solid nodule in the right upper lobe. Continued annual follow-up is suggested.  Repeat imaging in 1 year. .   # Thyroid cancer status post thyroidectomy low risk stage I; on Synthroid 88 mcg.  july 2022 -TSH- 0.3 [contributing to  patient's symptoms of fatigue/anxiety versus others see below]; detectable thyroglobulin-pending today.   #Fatigue: Worsening unclear-social stressors [son's death/alcoholism]; await thyroid tumor marker profile.  # Jan 2021-  BMD- Osteopenia- T score=- 2.0; improved from 4 years ago; ca+vit D BID-stable    DISPOSITION:  # follow up in 6 months-MD [[facility]/ thyroid profile/ Thyroiglobulin/and thyroglobulin antibodies- 1 week prior]; - Dr.B  # I reviewed the blood work- with the patient in detail; also reviewed the imaging independently [as summarized above]; and with the patient in detail.     Orders Placed This Encounter  Procedures  . CBC with Differential/Platelet    Standing Status:   Future    Standing Expiration Date:   05/21/2022  . Comprehensive metabolic panel    Standing Status:   Future    Standing Expiration Date:   05/21/2022  . Thyroid Panel With TSH    Standing Status:   Future    Standing Expiration Date:   05/21/2022  . TgAb+Thyroglobulin IMA or RIA    Standing Status:   Future    Standing Expiration Date:   05/21/2022   All questions were answered. The patient knows to call the clinic with any problems, questions or concerns.      Cammie Sickle, MD 05/23/2021 4:48 PM

## 2021-05-21 NOTE — Progress Notes (Signed)
Has had headaches on and off for a while, hot flashes, trouble sleeping, and a nervous feeling. Thinks all could be related to thyroid.

## 2021-05-21 NOTE — Assessment & Plan Note (Addendum)
#   RLL Lung ca; Stage I;  no adjuvant therapy. July 2022-- CT scan- STABLE; Status post right lower lobectomy. No evidence of recurrent or metastatic disease.  #July 2022 CT scan:  stable 9 mm sub solid nodule in the right upper lobe. Continued annual follow-up is suggested.  Repeat imaging in 1 year..   # Thyroid cancer status post thyroidectomy low risk stage I; on Synthroid 88 mcg.  july 2022 -TSH- 0.3 [contributing to patient's symptoms of fatigue/anxiety versus others see below]; detectable thyroglobulin-pending today.   #Fatigue: Worsening unclear-social stressors [son's death/alcoholism]; await thyroid tumor marker profile.  # Jan 2021-  BMD- Osteopenia- T score=- 2.0; improved from 4 years ago; ca+vit D BID-stable    DISPOSITION:  # follow up in 6 months-MD [[facility]/ thyroid profile/ Thyroiglobulin/and thyroglobulin antibodies- 1 week prior]; - Dr.B  # I reviewed the blood work- with the patient in detail; also reviewed the imaging independently [as summarized above]; and with the patient in detail.

## 2021-05-26 LAB — TGAB+THYROGLOBULIN IMA OR RIA: Thyroglobulin Antibody: 18.5 IU/mL — ABNORMAL HIGH (ref 0.0–0.9)

## 2021-05-26 LAB — THYROGLOBULIN BY RIA: Thyroglobulin by RIA: 8.8 ng/mL

## 2021-06-11 DIAGNOSIS — B078 Other viral warts: Secondary | ICD-10-CM | POA: Diagnosis not present

## 2021-06-11 DIAGNOSIS — R238 Other skin changes: Secondary | ICD-10-CM | POA: Diagnosis not present

## 2021-06-16 ENCOUNTER — Other Ambulatory Visit: Payer: Self-pay | Admitting: Family Medicine

## 2021-06-16 DIAGNOSIS — M8580 Other specified disorders of bone density and structure, unspecified site: Secondary | ICD-10-CM

## 2021-06-16 DIAGNOSIS — E785 Hyperlipidemia, unspecified: Secondary | ICD-10-CM

## 2021-06-24 ENCOUNTER — Other Ambulatory Visit: Payer: PPO

## 2021-06-24 DIAGNOSIS — H02401 Unspecified ptosis of right eyelid: Secondary | ICD-10-CM | POA: Diagnosis not present

## 2021-06-24 DIAGNOSIS — H02402 Unspecified ptosis of left eyelid: Secondary | ICD-10-CM | POA: Diagnosis not present

## 2021-06-25 ENCOUNTER — Other Ambulatory Visit: Payer: Self-pay

## 2021-06-25 ENCOUNTER — Other Ambulatory Visit (INDEPENDENT_AMBULATORY_CARE_PROVIDER_SITE_OTHER): Payer: PPO

## 2021-06-25 ENCOUNTER — Telehealth: Payer: Self-pay | Admitting: Radiology

## 2021-06-25 DIAGNOSIS — E785 Hyperlipidemia, unspecified: Secondary | ICD-10-CM

## 2021-06-25 DIAGNOSIS — E673 Hypervitaminosis D: Secondary | ICD-10-CM

## 2021-06-25 DIAGNOSIS — M8580 Other specified disorders of bone density and structure, unspecified site: Secondary | ICD-10-CM | POA: Diagnosis not present

## 2021-06-25 LAB — LIPID PANEL
Cholesterol: 161 mg/dL (ref 0–200)
HDL: 45.3 mg/dL (ref >39.00)
LDL Cholesterol: 88 mg/dL (ref 0–99)
NonHDL: 116.06
Total CHOL/HDL Ratio: 4
Triglycerides: 140 mg/dL (ref 0.0–149.0)
VLDL: 28 mg/dL (ref 0.0–40.0)

## 2021-06-25 LAB — VITAMIN D 25 HYDROXY (VIT D DEFICIENCY, FRACTURES): VITD: >120 ng/mL

## 2021-06-25 NOTE — Telephone Encounter (Signed)
Please have her hold all vitamin D for now and recheck a level in about 6 weeks.  Needs a lab visit.  I put in the order.  Thanks.

## 2021-06-25 NOTE — Telephone Encounter (Signed)
[facility] called a critical Vit D, >120. Results given to Dr Danise Mina and sent to Dr Damita Dunnings

## 2021-06-25 NOTE — Telephone Encounter (Signed)
Called the patient and informed her that her labs came back wehre her Vitamin D was greater than 120 which is critical. Patient informed that Dr. Damita Dunnings stated to stop all Vitamin D for now and recheck her labs in 6 weeks, patient stated that she has an appointment with Dr. Damita Dunnings next week and she stated that they would discuss the labs and lab work. Patient stated understanding and appreciation for call.

## 2021-06-26 ENCOUNTER — Other Ambulatory Visit: Payer: Self-pay | Admitting: Family Medicine

## 2021-07-01 ENCOUNTER — Ambulatory Visit (INDEPENDENT_AMBULATORY_CARE_PROVIDER_SITE_OTHER): Payer: PPO | Admitting: Family Medicine

## 2021-07-01 ENCOUNTER — Other Ambulatory Visit: Payer: Self-pay

## 2021-07-01 ENCOUNTER — Encounter: Payer: Self-pay | Admitting: Family Medicine

## 2021-07-01 VITALS — BP 130/82 | HR 89 | Temp 97.5°F | Ht 65.0 in | Wt 129.0 lb

## 2021-07-01 DIAGNOSIS — E7800 Pure hypercholesterolemia, unspecified: Secondary | ICD-10-CM | POA: Diagnosis not present

## 2021-07-01 DIAGNOSIS — Z0000 Encounter for general adult medical examination without abnormal findings: Secondary | ICD-10-CM

## 2021-07-01 DIAGNOSIS — E039 Hypothyroidism, unspecified: Secondary | ICD-10-CM

## 2021-07-01 DIAGNOSIS — E673 Hypervitaminosis D: Secondary | ICD-10-CM | POA: Diagnosis not present

## 2021-07-01 DIAGNOSIS — C3431 Malignant neoplasm of lower lobe, right bronchus or lung: Secondary | ICD-10-CM

## 2021-07-01 DIAGNOSIS — Z7189 Other specified counseling: Secondary | ICD-10-CM

## 2021-07-01 DIAGNOSIS — Z23 Encounter for immunization: Secondary | ICD-10-CM | POA: Diagnosis not present

## 2021-07-01 NOTE — Patient Instructions (Addendum)
Recheck vit D in about 6 weeks.  That may need to happen in [place], ask the front about that.   Take care.  Glad to see you. Don't change your meds for now.

## 2021-07-01 NOTE — Progress Notes (Signed)
This visit occurred during the SARS-CoV-2 public health emergency.  Safety protocols were in place, including screening questions prior to the visit, additional usage of staff PPE, and extensive cleaning of exam room while observing appropriate contact time as indicated for disinfecting solutions.  I have personally reviewed the Medicare Annual Wellness questionnaire and have noted 1. The patient's medical and social history 2. Their use of alcohol, tobacco or illicit drugs 3. Their current medications and supplements 4. The patient's functional ability including ADL's, fall risks, home safety risks and hearing or visual             impairment. 5. Diet and physical activities 6. Evidence for depression or mood disorders  The patients weight, height, BMI have been recorded in the chart and visual acuity is per eye clinic.  I have made referrals, counseling and provided education to the patient based review of the above and I have provided the pt with a written personalized care plan for preventive services.  Provider list updated- see scanned forms.  Routine anticipatory guidance given to patient.  See health maintenance. The possibility exists that previously documented standard health maintenance information may have been brought forward from a previous encounter into this note.  If needed, that same information has been updated to reflect the current situation based on today's encounter.    Flu 2022 Shingles discussed with patient, may be cheaper pharmacy. PNA up-to-date Tetanus 2010, may be cheaper pharmacy. COVID-vaccine discussed with patient.  Would defer second COVID booster given her history. Colonoscopy 2020 Breast cancer screening 2022 Bone density test 2021 Advance directive d/w pt.  Husband, then patient's sister Wynn Banker, then her niece Magdalene Patricia designated in that order should patient be incapacitated.  Cognitive function addressed- see scanned forms- and if abnormal  then additional documentation follows.   In addition to [facility], follow up visit for the below conditions:  Hypothyroidism.  Last TSH minimally low, d/w pt.  She has occ hot flashes, but not escalating.  She can tolerate as is.    Still seeing oncology at baseline.  D/w pt.    Elevated Cholesterol: Using medications without problems: yes Muscle aches: no Diet compliance: yes Exercise:yes Labs d/w pt.    High vit D level.  D/w pt.  She had some headaches and didn't know of that was related.  Improved with tylenol.  Recent calcium wnl.  HA noted in the last month or so. HA are not debilitating.    PMH and SH reviewed  Meds, vitals, and allergies reviewed.   ROS: Per HPI.  Unless specifically indicated otherwise in HPI, the patient denies:  General: fever. Eyes: acute vision changes ENT: sore throat Cardiovascular: chest pain Respiratory: SOB GI: vomiting GU: dysuria Musculoskeletal: acute back pain Derm: acute rash Neuro: acute motor dysfunction Psych: worsening mood Endocrine: polydipsia Heme: bleeding Allergy: hayfever  GEN: nad, alert and oriented HEENT: ncat NECK: supple w/o LA CV: rrr. PULM: ctab, no inc wob ABD: soft, +bs EXT: no edema SKIN: no acute rash but she has some warty changes on the fingers with dermatology follow-up pending.

## 2021-07-05 DIAGNOSIS — E673 Hypervitaminosis D: Secondary | ICD-10-CM | POA: Insufficient documentation

## 2021-07-05 NOTE — Assessment & Plan Note (Signed)
Continue pravastatin.  Labs discussed with patient.

## 2021-07-05 NOTE — Assessment & Plan Note (Signed)
Last TSH minimally low, d/w pt.  She has occ hot flashes, but not escalating.  She can tolerate as is.   Would continue levothyroxine as is.

## 2021-07-05 NOTE — Assessment & Plan Note (Signed)
History of  Still seeing oncology at baseline.  D/w pt. I will defer.  She agrees.

## 2021-07-05 NOTE — Assessment & Plan Note (Signed)
Advance directive d/w pt.  Husband, then patient's sister Wynn Banker, then her niece Magdalene Patricia designated in that order should patient be incapacitated.

## 2021-07-05 NOTE — Assessment & Plan Note (Signed)
She had some headaches and didn't know of that was related.  Improved with tylenol.  Recent calcium wnl.  HA noted in the last month or so. HA are not debilitating.   Would hold vitamin D and recheck lab in about 6 weeks.  See after visit summary.  She can keep track of her headaches in the meantime.  It is not at all clear that those are related but I do not suspect an ominous cause for her headaches.

## 2021-07-05 NOTE — Assessment & Plan Note (Signed)
Flu 2022 Shingles discussed with patient, may be cheaper pharmacy. PNA up-to-date Tetanus 2010, may be cheaper pharmacy. COVID-vaccine discussed with patient.  Would defer second COVID booster given her history. Colonoscopy 2020 Breast cancer screening 2022 Bone density test 2021 Advance directive d/w pt.  Husband, then patient's sister [name], then her niece [name] designated in that order should patient be incapacitated.  Cognitive function addressed- see scanned forms- and if abnormal then additional documentation follows.

## 2021-07-09 DIAGNOSIS — L538 Other specified erythematous conditions: Secondary | ICD-10-CM | POA: Diagnosis not present

## 2021-07-09 DIAGNOSIS — B078 Other viral warts: Secondary | ICD-10-CM | POA: Diagnosis not present

## 2021-07-22 DIAGNOSIS — H26493 Other secondary cataract, bilateral: Secondary | ICD-10-CM | POA: Diagnosis not present

## 2021-07-30 ENCOUNTER — Encounter: Payer: Self-pay | Admitting: Emergency Medicine

## 2021-07-30 ENCOUNTER — Other Ambulatory Visit: Payer: Self-pay

## 2021-07-30 ENCOUNTER — Ambulatory Visit
Admission: EM | Admit: 2021-07-30 | Discharge: 2021-07-30 | Disposition: A | Discharge status: Home / Self Care | Payer: PPO | Attending: Emergency Medicine | Admitting: Emergency Medicine

## 2021-07-30 DIAGNOSIS — Z1152 Encounter for screening for COVID-19: Secondary | ICD-10-CM | POA: Diagnosis not present

## 2021-07-30 DIAGNOSIS — J069 Acute upper respiratory infection, unspecified: Secondary | ICD-10-CM | POA: Diagnosis not present

## 2021-07-30 MED ORDER — BENZONATATE 100 MG PO CAPS
100.0000 mg | ORAL_CAPSULE | Freq: Three times a day (TID) | ORAL | 0 refills | Status: DC
Start: 2021-07-30 — End: 2021-10-05

## 2021-07-30 MED ORDER — FLUTICASONE PROPIONATE 50 MCG/ACT NA SUSP: 2.0000 | Freq: Every day | NASAL | 0 refills | Status: DC

## 2021-07-30 NOTE — ED Triage Notes (Signed)
Pt here with hacking cough, scratchy throat, nasal congestion and headache since Wednesday.

## 2021-07-30 NOTE — ED Provider Notes (Signed)
CHIEF COMPLAINT:   Chief Complaint  Patient presents with   Nasal Congestion   Sore Throat   Cough   Headache     SUBJECTIVE/HPI:  HPI A very pleasant 75 y.o.Female presents today with hacking cough, scratchy throat, nasal congestion and headache that started Wednesday. Patient does not report any shortness of breath, chest pain, palpitations, visual changes, weakness, tingling, nausea, vomiting, diarrhea, fever, chills.   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  ROS:  Review of Systems See Subjective/HPI Medications, Allergies and Problem List personally reviewed in Epic today OBJECTIVE:   Vitals:   07/30/21 0827  BP: (!) 157/67  Pulse: 100  Resp: 20  Temp: 98.8 F (37.1 C)  SpO2: 96%    Physical Exam   General: Appears well-developed and well-nourished. No acute distress.  HEENT Head: Normocephalic and atraumatic.  No frontal, maxillary sinus and nasal bridge tenderness noted to palpation. Ears: Hearing grossly intact, no drainage or visible deformity.  Nose: No nasal deviation.   Mouth/Throat: No stridor or tracheal deviation.  Non erythematous posterior pharynx noted with clear drainage present.  No white patchy exudate noted. Eyes: Conjunctivae and EOM are normal. No eye drainage or scleral icterus bilaterally.  Neck: Normal range of motion, neck is supple. No cervical, tonsillar or submandibular lymph nodes palpated.   Cardiovascular: Normal rate. Regular rhythm; no murmurs, gallops, or rubs.  Pulm/Chest: No respiratory distress. Breath sounds normal bilaterally without wheezes, rhonchi, or rales.  Neurological: Alert and oriented to person, place, and time.  Skin: Skin is warm and dry.   No rashes, lesions, abrasions or bruising noted to skin.   Psychiatric: Normal mood, affect, behavior, and thought content.   Vital signs and nursing note reviewed.   Patient stable and cooperative with examination. PROCEDURES:    LABS/X-RAYS/EKG/MEDS:   No results found for any visits on 07/30/21.  MEDICAL DECISION MAKING:   Patient presents with hacking cough, scratchy throat, nasal congestion and headache that started Wednesday. Patient does not report any shortness of breath, chest pain, palpitations, visual changes, weakness, tingling, nausea, vomiting, diarrhea, fever, chills.  COVID-19 pending.  Given symptoms along with assessment findings, likely viral illness.  Rx'd Flonase and Tessalon Perles to the patient's preferred pharmacy and advised about home treatment and care as outlined in her AVS to include rest, fluids, Mucinex.  Return to clinic for new high fever not improving the medications, chest pain, difficulty breathing, nonstop vomiting or coughing up blood.  Follow-up with PCP if not improving as expected in the next 5 to 7 days.  Patient verbalized understanding and agreed with treatment plan.  Patient stable upon discharge. ASSESSMENT/PLAN:  1. Encounter for screening for COVID-19 - Novel Coronavirus, NAA ([facility]); Standing - Novel Coronavirus, NAA ([facility])  2. Viral URI with cough - fluticasone (FLONASE) 50 MCG/ACT nasal spray; Place 2 sprays into both nostrils daily.  Dispense: 9.9 mL; Refill: 0 - benzonatate (TESSALON) 100 MG capsule; Take 1 capsule (100 mg total) by mouth every 8 (eight) hours.  Dispense: 21 capsule; Refill: 0 Instructions about new medications and side effects provided.  Plan:   Discharge Instructions      We will call you with any positive results from your COVID-19 testing completed in clinic today.  If you do not receive a phone  call from [place] within the next 2-3 days, check your MyChart for up-to-date health information related to  testing completed in clinic today.   For most people this is a self-limiting process and can take anywhere from 7 - 10 days to start feeling better. A cough can last up to 3 weeks. Pay special attention to handwashing as this can help prevent the spread of the virus.   Always read the labels of cough and cold medications as they may contain some of the ingredients below.  Rest, push lots of fluids (especially water), and utilize supportive care for symptoms. You may take acetaminophen (Tylenol) every 4-6 hours and ibuprofen every 6-8 hours for muscle pain, joint pain, headaches (you may also alternate these medications). Mucinex (guaifenesin) may be taken over the counter for cough as needed can loosen phlegm. Please read the instructions and take as directed.  Sudafed (pseudophedrine) is sold behind the counter and can help reduce nasal pressure; avoid taking this if you have high blood pressure or feel jittery. Sudafed PE (phenylephrine) can be a helpful, short-term, over-the-counter alternative to limit side effects or if you have high blood pressure.  Flonase nasal spray can help alleviate congestion and sinus pressure. Many patients choose Afrin as a nasal decongestant; do not use for more than 3 days for risk of rebound (increased symptoms after stopping medication).  Saline nasal sprays or rinses can also help nasal congestion (use bottled or sterile water). Warm tea with lemon and honey can sooth sore throat and cough, as can cough drops.   Return to clinic for high fever not improving with medications, chest pain, difficulty breathing, non-stop vomiting, or coughing blood. Follow-up with your primary care provider if symptoms do not improve as expected in the next 5-7 days.          [name], [place] 07/30/21 [phone]

## 2021-07-30 NOTE — Discharge Instructions (Addendum)
We will call you with any positive results from your COVID-19 testing completed in clinic today.  If you do not receive a phone call from us within the next 2-3 days, check your MyChart for up-to-date health information related to testing completed in clinic today.   For most people this is a self-limiting process and can take anywhere from 7 - 10 days to start feeling better. A cough can last up to 3 weeks. Pay special attention to handwashing as this can help prevent the spread of the virus.   Always read the labels of cough and cold medications as they may contain some of the ingredients below.  Rest, push lots of fluids (especially water), and utilize supportive care for symptoms. You may take acetaminophen (Tylenol) every 4-6 hours and ibuprofen every 6-8 hours for muscle pain, joint pain, headaches (you may also alternate these medications). Mucinex (guaifenesin) may be taken over the counter for cough as needed can loosen phlegm. Please read the instructions and take as directed.  Sudafed (pseudophedrine) is sold behind the counter and can help reduce nasal pressure; avoid taking this if you have high blood pressure or feel jittery. Sudafed PE (phenylephrine) can be a helpful, short-term, over-the-counter alternative to limit side effects or if you have high blood pressure.  Flonase nasal spray can help alleviate congestion and sinus pressure. Many patients choose Afrin as a nasal decongestant; do not use for more than 3 days for risk of rebound (increased symptoms after stopping medication).  Saline nasal sprays or rinses can also help nasal congestion (use bottled or sterile water). Warm tea with lemon and honey can sooth sore throat and cough, as can cough drops.   Return to clinic for high fever not improving with medications, chest pain, difficulty breathing, non-stop vomiting, or coughing blood. Follow-up with your primary care provider if symptoms do not improve as expected in the next  5-7 days.  

## 2021-08-01 LAB — SARS-COV-2, NAA 2 DAY TAT

## 2021-08-01 LAB — NOVEL CORONAVIRUS, NAA: SARS-CoV-2, NAA: NOT DETECTED

## 2021-08-11 ENCOUNTER — Other Ambulatory Visit (INDEPENDENT_AMBULATORY_CARE_PROVIDER_SITE_OTHER): Payer: PPO

## 2021-08-11 ENCOUNTER — Other Ambulatory Visit: Payer: Self-pay

## 2021-08-11 DIAGNOSIS — E673 Hypervitaminosis D: Secondary | ICD-10-CM | POA: Diagnosis not present

## 2021-08-11 LAB — VITAMIN D 25 HYDROXY (VIT D DEFICIENCY, FRACTURES): VITD: 68.71 ng/mL (ref 30.00–100.00)

## 2021-08-12 ENCOUNTER — Other Ambulatory Visit: Payer: PPO

## 2021-08-12 DIAGNOSIS — H26493 Other secondary cataract, bilateral: Secondary | ICD-10-CM | POA: Diagnosis not present

## 2021-08-13 ENCOUNTER — Telehealth: Payer: Self-pay | Admitting: Family Medicine

## 2021-08-13 NOTE — Telephone Encounter (Signed)
Please let her know vit D is normal and I'll send a note via mychart.  Thanks.

## 2021-08-13 NOTE — Telephone Encounter (Signed)
Pt called in stating that she wants to know the status of her lab results

## 2021-08-13 NOTE — Telephone Encounter (Signed)
Patient notified of lab results and verbalized understanding.  ° °

## 2021-08-16 ENCOUNTER — Other Ambulatory Visit: Payer: Self-pay | Admitting: Family Medicine

## 2021-08-16 MED ORDER — VITAMIN D3 25 MCG (1000 UT) PO CAPS: 1000.0000 [IU] | ORAL_CAPSULE | ORAL | Status: DC

## 2021-08-17 ENCOUNTER — Encounter: Payer: Self-pay | Admitting: Family Medicine

## 2021-08-17 DIAGNOSIS — H903 Sensorineural hearing loss, bilateral: Secondary | ICD-10-CM | POA: Diagnosis not present

## 2021-08-20 ENCOUNTER — Other Ambulatory Visit: Payer: Self-pay | Admitting: Family Medicine

## 2021-08-20 DIAGNOSIS — B078 Other viral warts: Secondary | ICD-10-CM | POA: Diagnosis not present

## 2021-08-20 DIAGNOSIS — L538 Other specified erythematous conditions: Secondary | ICD-10-CM | POA: Diagnosis not present

## 2021-08-20 MED ORDER — CALCIUM PLUS VITAMIN D 500-1.25 MG-MCG PO CAPS: 1.0000 | ORAL_CAPSULE | ORAL | Status: DC

## 2021-09-23 ENCOUNTER — Other Ambulatory Visit: Payer: Self-pay | Admitting: Internal Medicine

## 2021-09-23 DIAGNOSIS — C73 Malignant neoplasm of thyroid gland: Secondary | ICD-10-CM

## 2021-09-23 DIAGNOSIS — C3431 Malignant neoplasm of lower lobe, right bronchus or lung: Secondary | ICD-10-CM

## 2021-10-05 ENCOUNTER — Ambulatory Visit (INDEPENDENT_AMBULATORY_CARE_PROVIDER_SITE_OTHER): Payer: PPO | Admitting: Family Medicine

## 2021-10-05 ENCOUNTER — Encounter: Payer: Self-pay | Admitting: Family Medicine

## 2021-10-05 ENCOUNTER — Other Ambulatory Visit: Payer: Self-pay

## 2021-10-05 ENCOUNTER — Ambulatory Visit (INDEPENDENT_AMBULATORY_CARE_PROVIDER_SITE_OTHER): Payer: PPO

## 2021-10-05 VITALS — BP 120/70 | HR 75 | Temp 97.6°F | Ht 65.0 in | Wt 127.0 lb

## 2021-10-05 DIAGNOSIS — R519 Headache, unspecified: Secondary | ICD-10-CM

## 2021-10-05 DIAGNOSIS — M549 Dorsalgia, unspecified: Secondary | ICD-10-CM | POA: Diagnosis not present

## 2021-10-05 DIAGNOSIS — I700 Atherosclerosis of aorta: Secondary | ICD-10-CM | POA: Diagnosis not present

## 2021-10-05 MED ORDER — METHOCARBAMOL 500 MG PO TABS: 500.0000 mg | ORAL_TABLET | Freq: Three times a day (TID) | ORAL | 2 refills | Status: DC | PRN

## 2021-10-05 NOTE — Patient Instructions (Signed)
Go to the lab on the way out.   If you have mychart we'll likely use that to update you.    Take care.  Glad to see you. Try taking robaxin for the pressure in your back.  See if that helps with sleep.  Keep taking tylenol if needed for headaches.

## 2021-10-05 NOTE — Progress Notes (Signed)
This visit occurred during the SARS-CoV-2 public health emergency.  Safety protocols were in place, including screening questions prior to the visit, additional usage of staff PPE, and extensive cleaning of exam room while observing appropriate contact time as indicated for disinfecting solutions.  She got hearing aids and they help.    Still having some episodic HA over the last few months.  Better with tylenol.  Still with HA everyday, frontal.  She isn't sleeping well and unclear if that contributes- but that is clearly possible.  She is waking in the middle of the night mult times and that is disrupting her sleep.    Back pressure for 3 weeks.  In the posterior neck, then in the midback.  Can radiate across the back.  She can have local tingling.  Feels like some pressure, ie something pushing on the back.  Not knowing the cause is more worrisome to the patient than the pressure itself.  Heat helps.  No rash, no bruise.  No dysphagia except for one episode of regurgitation.  No blood in urine or stool.  No sputum.  No fevers.  Not SOB.    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  She had tx per outside clinic.  She declined repeat N2 tx today, d/w pt about options.    She had eye clinic f/u in the meantime.  She is going to have eyelid procedure done.    Meds, vitals, and allergies reviewed.   ROS: Per HPI unless specifically indicated in ROS section   GEN: nad, alert and oriented HEENT: ncat, upper eyelid lag noted at baseline.  She has hearing aids now NECK: supple w/o LA CV: rrr. PULM: ctab, no inc wob ABD: soft, +bs EXT: no edema SKIN: no acute rash R hand with warts noted.   Chest wall nontender. CN 2-12 wnl B, S/S wnl x4

## 2021-10-06 DIAGNOSIS — R519 Headache, unspecified: Secondary | ICD-10-CM | POA: Insufficient documentation

## 2021-10-06 NOTE — Assessment & Plan Note (Signed)
Without neurologic symptoms or worrisome findings.  Unclear if this is related to sleep disruption.  Discussed options.  She is going to try a muscle relaxer to see if that helps with the chest wall discomfort.  She can also see if that helps with sleep and then update me as needed.  She agrees with plan.  Normal neurologic exam.

## 2021-10-06 NOTE — Assessment & Plan Note (Signed)
Likely benign musculoskeletal cause but reasonable to check plain films today and try methocarbamol to see if that helps.  See notes on imaging.  33 minutes were devoted to patient care in this encounter (this includes time spent reviewing the patient's file/history, interviewing and examining the patient, counseling/reviewing plan with patient).

## 2021-10-22 ENCOUNTER — Ambulatory Visit: Payer: Self-pay

## 2021-10-22 ENCOUNTER — Ambulatory Visit
Admission: EM | Admit: 2021-10-22 | Discharge: 2021-10-22 | Disposition: A | Discharge status: Home / Self Care | Payer: PPO | Attending: Medical Oncology | Admitting: Medical Oncology

## 2021-10-22 ENCOUNTER — Other Ambulatory Visit: Payer: Self-pay

## 2021-10-22 ENCOUNTER — Encounter: Payer: Self-pay | Admitting: Emergency Medicine

## 2021-10-22 DIAGNOSIS — R509 Fever, unspecified: Secondary | ICD-10-CM | POA: Diagnosis not present

## 2021-10-22 DIAGNOSIS — Z20822 Contact with and (suspected) exposure to covid-19: Secondary | ICD-10-CM | POA: Diagnosis not present

## 2021-10-22 DIAGNOSIS — R058 Other specified cough: Secondary | ICD-10-CM

## 2021-10-22 DIAGNOSIS — Z1152 Encounter for screening for COVID-19: Secondary | ICD-10-CM | POA: Diagnosis not present

## 2021-10-22 DIAGNOSIS — R0981 Nasal congestion: Secondary | ICD-10-CM | POA: Diagnosis not present

## 2021-10-22 DIAGNOSIS — R051 Acute cough: Secondary | ICD-10-CM

## 2021-10-22 LAB — POCT INFLUENZA A/B
Influenza A, POC: NEGATIVE
Influenza B, POC: NEGATIVE

## 2021-10-22 MED ORDER — BENZONATATE 200 MG PO CAPS: 200.0000 mg | ORAL_CAPSULE | Freq: Three times a day (TID) | ORAL | 0 refills | Status: DC | PRN

## 2021-10-22 MED ORDER — FLUTICASONE PROPIONATE 50 MCG/ACT NA SUSP: 2.0000 | Freq: Every day | NASAL | 0 refills | Status: DC

## 2021-10-22 NOTE — ED Triage Notes (Signed)
Pt here with URI sx with direct, close exposure to COVID on Tuesday. Sx started on Weds.

## 2021-10-22 NOTE — ED Provider Notes (Addendum)
[name]    CSN: [id number] Arrival date & time: 10/22/21  7829      History   Chief Complaint Chief Complaint  Patient presents with   Cough   Headache   Generalized Body Aches   Nasal Congestion   Sore Throat   Fever    HPI [name] is a 75 y.o. female.   HPI  Cold Symptoms: Patient reports that they have had symptoms of cough, headache, body aches, low grade fevers, sore throat and nasal congestion for the past 2-3 days. Symptoms are stable. They deny SOB, chest pain, fever or vomiting. They have tried tylenol, tessalon for symptoms. Had close COVID-19 exposure on Tuesday. CMP on 05/13/2021 shows normal kidney function.    Past Medical History:  Diagnosis Date   Adenocarcinoma of right lung ([place]) 10/01/2015   GERD (gastroesophageal reflux disease)    GIB (gastrointestinal bleeding)    Groin pain    Along superior/laterl R inguinal canal.  Could be due to hernia or old scar tissue.  prev with CT done w/o alarming findings.  Will treat episodically.  Use tylenol #3 prn.     Hyperlipidemia    Lipoma of thigh    Left   Normal cardiac stress test 2014   PONV (postoperative nausea and vomiting)    nausea after hysterectomy, and 2 days after lung surgery 10/2015   Thyroid cancer ([place]) 12/2015   Thyroid nodule     Patient Active Problem List   Diagnosis Date Noted   Headache 10/06/2021   High vitamin D level 07/05/2021   Other social stressor 10/21/2020   Hypothyroidism, unspecified 07/02/2020   Wart 07/02/2020   Dermatitis 02/20/2020   Stress reaction 02/20/2020   Polyp of transverse colon    Gastric polyp    Stomach irritation    Healthcare maintenance 06/30/2019   GI bleed 05/17/2019   Upper back pain 04/14/2018   Aortic atherosclerosis ([place]) 05/31/2017   Cancer of lower lobe of right lung ([place]) 05/06/2016   Thyroid cancer ([employer]) 09/11/2015   Advance care planning 05/14/2014   Thrombocytopenia ([place]) 07/03/2013   Osteopenia 05/05/2013    Medicare annual wellness visit, subsequent 04/26/2012   GERD (gastroesophageal reflux disease) 04/26/2012   Essential hypertension 11/17/2010   HLD (hyperlipidemia) 01/31/2008   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    Past Surgical History:  Procedure Laterality Date   ABDOMINAL HYSTERECTOMY     BREAST EXCISIONAL BIOPSY Right 1977   BREAST MASS EXCISION  1977   benign   CARPAL TUNNEL RELEASE Right 1978   CATARACT EXTRACTION, BILATERAL Bilateral    CHOLECYSTECTOMY  11/23/2016   Procedure: LAPAROSCOPIC CHOLECYSTECTOMY;  Surgeon: Clayburn Pert, MD;  Location: [facility];  Service: General;;   COLONOSCOPY  2009   COLONOSCOPY WITH PROPOFOL N/A 05/18/2019   Procedure: COLONOSCOPY WITH PROPOFOL;  Surgeon: Virgel Manifold, MD;  Location: [facility];  Service: Endoscopy;  Laterality: N/A;   COLONOSCOPY WITH PROPOFOL N/A 08/15/2019   Procedure: COLONOSCOPY WITH PROPOFOL;  Surgeon: Virgel Manifold, MD;  Location: [facility];  Service: Endoscopy;  Laterality: N/A;   DILATION AND CURETTAGE OF UTERUS  2001   ELECTROMAGNETIC NAVIGATION BROCHOSCOPY N/A 08/25/2015   Procedure: ELECTROMAGNETIC NAVIGATION BRONCHOSCOPY;  Surgeon: Flora Lipps, MD;  Location: [facility];  Service: Cardiopulmonary;  Laterality: N/A;   ENDOBRONCHIAL ULTRASOUND N/A 08/25/2015   Procedure: ENDOBRONCHIAL ULTRASOUND;  Surgeon: Flora Lipps, MD;  Location: [facility];  Service: Cardiopulmonary;  Laterality: N/A;   ESOPHAGOGASTRODUODENOSCOPY  2002,  11/15/07   Normal (Dr. Allyn Kenner)   ESOPHAGOGASTRODUODENOSCOPY (EGD) WITH PROPOFOL N/A 05/18/2019   Procedure: ESOPHAGOGASTRODUODENOSCOPY (EGD) WITH PROPOFOL;  Surgeon: Virgel Manifold, MD;  Location: [facility];  Service: Endoscopy;  Laterality: N/A;   ESOPHAGOGASTRODUODENOSCOPY (EGD) WITH PROPOFOL N/A 08/15/2019   Procedure: ESOPHAGOGASTRODUODENOSCOPY (EGD) WITH PROPOFOL;  Surgeon: Virgel Manifold, MD;  Location: [facility];  Service:  Endoscopy;  Laterality: N/A;   FACIAL COSMETIC SURGERY  01/13/10   mini facelift   HERNIA REPAIR  1976   OOPHORECTOMY     THORACOTOMY/LOBECTOMY Right 10/27/2015   Procedure: THORACOTOMY/LOBECTOMY;  Surgeon: Nestor Lewandowsky, MD;  Location: [facility];  Service: Thoracic;  Laterality: Right;   THYROIDECTOMY N/A 12/29/2015   Procedure: THYROIDECTOMY;  Surgeon: Beverly Gust, MD;  Location: [facility];  Service: ENT;  Laterality: N/A;   TOTAL VAGINAL HYSTERECTOMY  12/30/08   for fibroid pain (Dr. Gertie Fey)    OB History     Gravida  0   Para  0   Term  0   Preterm  0   AB  0   Living  0      SAB  0   IAB  0   Ectopic  0   Multiple  0   Live Births           Obstetric Comments  1st Menstrual Cycle:  10 1st Pregnancy:  1          Home Medications    Prior to Admission medications   Medication Sig Start Date End Date Taking? Authorizing Provider  acetaminophen (TYLENOL) 500 MG tablet Take 1,000 mg by mouth every 6 (six) hours as needed for mild pain, moderate pain or headache.    [provider]  Ascorbic Acid (VITAMIN C) 500 MG tablet Take 2,000 mg by mouth daily. Reported on 02/04/2016    [provider]  aspirin EC 81 MG tablet Take 1 tablet (81 mg total) by mouth daily. 11/29/19   Tonia Ghent, MD  Biotin 1000 MCG tablet Take 1,000 mcg by mouth daily. Reported on 02/04/2016    [provider]  CALCIUM-VITAMIN D PO Take by mouth. 1 tab MWF    [provider]  Coenzyme Q10 200 MG capsule Take 200 mg by mouth daily.    [provider]  Folic Acid-Vit E4-MPN T61 (FA-VITAMIN B-6-VITAMIN B-12 PO) Take 1 tablet by mouth daily after breakfast. Reported on 02/04/2016    [provider]  levothyroxine (SYNTHROID) 88 MCG tablet TAKE 1 TABLET BY MOUTH DAILY BEFORE BREAKFAST. 09/27/21   Cammie Sickle, MD  Lutein 20 MG CAPS Take 20 mg by mouth daily.    [provider]  methocarbamol (ROBAXIN) 500 MG tablet Take 1  tablet (500 mg total) by mouth every 8 (eight) hours as needed for muscle spasms. 10/05/21   Tonia Ghent, MD  Omega-3 Fatty Acids (SUPER OMEGA 3 EPA/DHA) 1000 MG CAPS Take 1,000 mg by mouth daily with supper.    [provider]  omeprazole (PRILOSEC) 40 MG capsule TAKE 1 CAPSULE BY MOUTH EVERY DAY 05/14/21   Tonia Ghent, MD  Potassium Gluconate 595 MG CAPS Take 595 mg by mouth daily.     [provider]  pravastatin (PRAVACHOL) 40 MG tablet TAKE 1 TABLET BY MOUTH EVERY DAY 06/28/21   Tonia Ghent, MD  Calcium Carbonate (CALCIUM 500 PO) Take 1 tablet by mouth every morning.  06/29/20  [provider]  calcium gluconate 500 MG  tablet Take 500 mg by mouth daily.  06/29/20  [provider]  metoprolol succinate (TOPROL-XL) 25 MG 24 hr tablet Take 25 mg by mouth at bedtime.  06/29/20  [provider]  simvastatin (ZOCOR) 40 MG tablet Take 40 mg by mouth at bedtime.  06/29/20  [provider]    Family History Family History  Problem Relation Age of Onset   Transient ischemic attack Mother    Hypertension Mother    Stroke Mother        mini strokes   Hypertension Sister    Cancer Sister        ovarian   Hypertension Sister    Hypertension Sister    Hypertension Sister    Breast cancer Neg Hx    Colon cancer Neg Hx     Social History Social History   Tobacco Use   Smoking status: Never   Smokeless tobacco: Never  Vaping Use   Vaping Use: Never used  Substance Use Topics   Alcohol use: No    Alcohol/week: 0.0 standard drinks   Drug use: No     Allergies   Atorvastatin, Ciprofloxacin, Epinephrine, Metoprolol succinate, Simvastatin, and Sulfa antibiotics   Review of Systems Review of Systems  As stated above in HPI Physical Exam Triage Vital Signs ED Triage Vitals  Enc Vitals Group     BP 10/22/21 0850 (!) 148/88     Pulse Rate 10/22/21 0850 (!) 111     Resp 10/22/21 0850 20     Temp 10/22/21 0850 99.2 F  (37.3 C)     Temp src --      SpO2 10/22/21 0850 97 %     Weight --      Height --      Head Circumference --      Peak Flow --      Pain Score 10/22/21 0859 4     Pain Loc --      Pain Edu? --      Excl. in [place]? --    No data found.  Updated Vital Signs BP (!) 148/88    Pulse (!) 111    Temp 99.2 F (37.3 C)    Resp 20    SpO2 97%   Physical Exam Vitals and nursing note reviewed.  Constitutional:      General: She is not in acute distress.    Appearance: Normal appearance. She is not ill-appearing, toxic-appearing or diaphoretic.  HENT:     Head: Normocephalic and atraumatic.     Right Ear: Tympanic membrane normal.     Left Ear: Tympanic membrane normal.     Nose: Congestion present.     Mouth/Throat:     Mouth: Mucous membranes are moist.     Pharynx: Oropharynx is clear. No oropharyngeal exudate or posterior oropharyngeal erythema.  Eyes:     Extraocular Movements: Extraocular movements intact.     Conjunctiva/sclera: Conjunctivae normal.     Pupils: Pupils are equal, round, and reactive to light.  Cardiovascular:     Rate and Rhythm: Normal rate and regular rhythm.     Heart sounds: Normal heart sounds.  Pulmonary:     Effort: Pulmonary effort is normal.     Breath sounds: Normal breath sounds.  Musculoskeletal:     Cervical back: Normal range of motion and neck supple.  Lymphadenopathy:     Cervical: No cervical adenopathy.  Skin:    General: Skin is warm.  Neurological:  Mental Status: She is alert and oriented to person, place, and time.     UC Treatments / Results  Labs (all labs ordered are listed, but only abnormal results are displayed) Labs Reviewed  POCT INFLUENZA A/B    EKG   Radiology No results found.  Procedures Procedures (including critical care time)  Medications Ordered in UC Medications - No data to display  Initial Impression / Assessment and Plan / UC Course  I have reviewed the triage vital signs and the nursing  notes.  Pertinent labs & imaging results that were available during my care of the patient were reviewed by me and considered in my medical decision making (see chart for details).     New. Appears viral in nature which I discussed with patient. She asks for an antibiotic multiple times and I discussed why this would be more harmful than beneficial at this time. Discussed tessalon, flonase and antiviral medications should her testing be positive. Discussed quarantine recommendations. Discussed risks and benefits along with rebound. Discussed red flag signs and symptoms.  Final Clinical Impressions(s) / UC Diagnoses   Final diagnoses:  None   Discharge Instructions   None    ED Prescriptions   None    PDMP not reviewed this encounter.   Hughie Closs, PA-C 10/22/21 0920    Hughie Closs, PA-C 10/22/21 [phone]

## 2021-10-24 LAB — NOVEL CORONAVIRUS, NAA: SARS-CoV-2, NAA: DETECTED — AB

## 2021-10-24 LAB — SARS-COV-2, NAA 2 DAY TAT

## 2021-10-28 ENCOUNTER — Telehealth: Payer: Self-pay | Admitting: Family Medicine

## 2021-10-28 NOTE — Progress Notes (Deleted)
error 

## 2021-10-28 NOTE — Chronic Care Management (AMB) (Signed)
°  Chronic Care Management   Note  10/28/2021 Name: [name] MRN: [id number] DOB: Oct 12, 1946  [name] is a 75 y.o. year old female who is a primary care patient of Tonia Ghent, MD. I reached out to Drucie Opitz by phone today in response to a referral sent by Ms. Apolonio Schneiders [place]'s PCP, Tonia Ghent, MD.   Ms. Biffle was given information about Chronic Care Management services today including:  CCM service includes personalized support from designated clinical staff supervised by her physician, including individualized plan of care and coordination with other care providers 24/7 contact phone numbers for assistance for urgent and routine care needs. Service will only be billed when office clinical staff spend 20 minutes or more in a month to coordinate care. Only one practitioner may furnish and bill the service in a calendar month. The patient may stop CCM services at any time (effective at the end of the month) by phone call to the office staff.   Patient agreed to services and verbal consent obtained.   Follow up plan:   Tatjana Secretary/administrator

## 2021-10-28 NOTE — Chronic Care Management (AMB) (Signed)
error 

## 2021-11-09 ENCOUNTER — Other Ambulatory Visit: Payer: Self-pay | Admitting: Family Medicine

## 2021-11-09 DIAGNOSIS — M2011 Hallux valgus (acquired), right foot: Secondary | ICD-10-CM | POA: Diagnosis not present

## 2021-11-09 DIAGNOSIS — M2041 Other hammer toe(s) (acquired), right foot: Secondary | ICD-10-CM | POA: Diagnosis not present

## 2021-11-24 ENCOUNTER — Inpatient Hospital Stay: Payer: PPO | Attending: Internal Medicine

## 2021-11-24 ENCOUNTER — Other Ambulatory Visit: Payer: Self-pay

## 2021-11-24 DIAGNOSIS — Z79899 Other long term (current) drug therapy: Secondary | ICD-10-CM | POA: Diagnosis not present

## 2021-11-24 DIAGNOSIS — Z85850 Personal history of malignant neoplasm of thyroid: Secondary | ICD-10-CM | POA: Diagnosis not present

## 2021-11-24 DIAGNOSIS — C3431 Malignant neoplasm of lower lobe, right bronchus or lung: Secondary | ICD-10-CM

## 2021-11-24 DIAGNOSIS — Z7982 Long term (current) use of aspirin: Secondary | ICD-10-CM | POA: Insufficient documentation

## 2021-11-24 DIAGNOSIS — Z85118 Personal history of other malignant neoplasm of bronchus and lung: Secondary | ICD-10-CM | POA: Insufficient documentation

## 2021-11-24 LAB — COMPREHENSIVE METABOLIC PANEL
ALT: 20 U/L (ref 0–44)
AST: 20 U/L (ref 15–41)
Albumin: 4 g/dL (ref 3.5–5.0)
Alkaline Phosphatase: 57 U/L (ref 38–126)
Anion gap: 7 (ref 5–15)
BUN: 19 mg/dL (ref 8–23)
CO2: 30 mmol/L (ref 22–32)
Calcium: 9.3 mg/dL (ref 8.9–10.3)
Chloride: 102 mmol/L (ref 98–111)
Creatinine, Ser: 0.56 mg/dL (ref 0.44–1.00)
GFR, Estimated: >60 mL/min (ref >60)
Glucose, Bld: 98 mg/dL (ref 70–99)
Potassium: 4.4 mmol/L (ref 3.5–5.1)
Sodium: 139 mmol/L (ref 135–145)
Total Bilirubin: 0.5 mg/dL (ref 0.3–1.2)
Total Protein: 6.5 g/dL (ref 6.5–8.1)

## 2021-11-24 LAB — CBC WITH DIFFERENTIAL/PLATELET
Abs Immature Granulocytes: 0.02 10*3/uL (ref 0.00–0.07)
Basophils Absolute: 0.1 10*3/uL (ref 0.0–0.1)
Basophils Relative: 1 %
Eosinophils Absolute: 0.1 10*3/uL (ref 0.0–0.5)
Eosinophils Relative: 2 %
HCT: 38.8 % (ref 36.0–46.0)
Hemoglobin: 12.7 g/dL (ref 12.0–15.0)
Immature Granulocytes: 0 %
Lymphocytes Relative: 29 %
Lymphs Abs: 1.7 10*3/uL (ref 0.7–4.0)
MCH: 31.1 pg (ref 26.0–34.0)
MCHC: 32.7 g/dL (ref 30.0–36.0)
MCV: 94.9 fL (ref 80.0–100.0)
Monocytes Absolute: 0.5 10*3/uL (ref 0.1–1.0)
Monocytes Relative: 9 %
Neutro Abs: 3.4 10*3/uL (ref 1.7–7.7)
Neutrophils Relative %: 59 %
Platelets: 136 10*3/uL — ABNORMAL LOW (ref 150–400)
RBC: 4.09 MIL/uL (ref 3.87–5.11)
RDW: 14.3 % (ref 11.5–15.5)
WBC: 5.7 10*3/uL (ref 4.0–10.5)
nRBC: 0 % (ref 0.0–0.2)

## 2021-11-25 LAB — THYROID PANEL WITH TSH
Free Thyroxine Index: 2.6 (ref 1.2–4.9)
T3 Uptake Ratio: 30 % (ref 24–39)
T4, Total: 8.7 ug/dL (ref 4.5–12.0)
TSH: 0.896 u[IU]/mL (ref 0.450–4.500)

## 2021-11-26 ENCOUNTER — Telehealth: Payer: Self-pay | Admitting: Internal Medicine

## 2021-11-26 ENCOUNTER — Other Ambulatory Visit: Payer: Self-pay

## 2021-11-26 ENCOUNTER — Inpatient Hospital Stay: Payer: PPO | Admitting: Internal Medicine

## 2021-11-26 ENCOUNTER — Encounter: Payer: Self-pay | Admitting: Internal Medicine

## 2021-11-26 ENCOUNTER — Other Ambulatory Visit: Payer: PPO

## 2021-11-26 DIAGNOSIS — C3431 Malignant neoplasm of lower lobe, right bronchus or lung: Secondary | ICD-10-CM

## 2021-11-26 DIAGNOSIS — Z85118 Personal history of other malignant neoplasm of bronchus and lung: Secondary | ICD-10-CM | POA: Diagnosis not present

## 2021-11-26 NOTE — Progress Notes (Signed)
Pt wanted to let you know she had some back/neck pain since last visit and was treated for that.

## 2021-11-26 NOTE — Telephone Encounter (Signed)
Colette spoke with the pt, she no longer gets her labs at [place]. They will be done here, orders entered.

## 2021-11-26 NOTE — Telephone Encounter (Signed)
Pt called back about not seeing her lab appt listed in her mychart. Please schedule appt and call pt back at [phone]

## 2021-11-26 NOTE — Progress Notes (Signed)
[place] OFFICE PROGRESS NOTE  Patient Care Team: Tonia Ghent, MD as PCP - General (Family Medicine) Kate Sable, MD as PCP - Cardiology (Cardiology) Beverly Gust, MD as Consulting Physician (Otolaryngology) Nestor Lewandowsky, MD (Inactive) as Consulting Physician (Cardiothoracic Surgery) Cammie Sickle, MD as Consulting Physician (Internal Medicine) Birder Robson, MD as Consulting Physician (Ophthalmology) Clayburn Pert, MD as Consulting Physician (General Surgery) Charlton Haws, [facility] as Pharmacist (Pharmacist)   Cancer Staging  Thyroid cancer [facility]) Staging form: Thyroid - Papillary or Follicular (Under 45 years), AJCC 7th Edition - Clinical: Stage I (T1, N0, M0) - Signed by Forest Gleason, MD on 09/11/2015 Laterality: Right    Oncology History Overview Note  # NOV 2016- RLL [place] ca T1N0 [incidental; Dr.Simonds; Bronc- s/p RLlobectomy; Dr.Oaks;Dec 2016]  # FEB 2017- 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 STAGE I; s/p Total Thyroidetcomy [Dr.Chapman];NO RAIU.  LOW RISK; but detectable thyroglobulin- on Synthroid  #Survivorship-pending  DIAGNOSIS: Lung cancer -stage I #thyroid cancer low risk  GOALS: Cure  CURRENT/MOST RECENT THERAPY: Surveillance    Thyroid cancer (HCC)  Cancer of lower lobe of right lung ([place])  05/06/2016 Initial Diagnosis   Malignant neoplasm of lower lobe, right bronchus or lung (HCC)    INTERVAL HISTORY:  [name] 76 y.o.  female pleasant patient above history of stage I lung cancer and also stage I thyroid cancer on synthroid currently on surveillance is here for follow-up.   Patient denies any fever or chills or cough.  Denies any nausea vomiting abdominal pain.  Denies any headaches.  Complains of fatigue.   Review of Systems  Constitutional:  Positive for malaise/fatigue. Negative for chills, diaphoresis, fever and weight  loss.  HENT:  Negative for nosebleeds and sore throat.   Eyes:  Negative for double vision.  Respiratory:  Negative for cough, hemoptysis, sputum production, shortness of breath and wheezing.   Cardiovascular:  Negative for chest pain, palpitations, orthopnea and leg swelling.  Gastrointestinal:  Negative for abdominal pain, blood in stool, constipation, diarrhea, heartburn, melena and vomiting.  Genitourinary:  Negative for dysuria, frequency and urgency.  Musculoskeletal:  Positive for back pain. Negative for joint pain.  Skin: Negative.  Negative for itching and rash.  Neurological:  Positive for tingling. Negative for dizziness, focal weakness, weakness and headaches.  Endo/Heme/Allergies:  Does not bruise/bleed easily.  Psychiatric/Behavioral:  Negative for depression. The patient is not nervous/anxious and does not have insomnia.      PAST MEDICAL HISTORY :  Past Medical History:  Diagnosis Date   Adenocarcinoma of right lung ([place]) 10/01/2015   GERD (gastroesophageal reflux disease)    GIB (gastrointestinal bleeding)    Groin pain    Along superior/laterl R inguinal canal.  Could be due to hernia or old scar tissue.  prev with CT done w/o alarming findings.  Will treat episodically.  Use tylenol #3 prn.     Hyperlipidemia    Lipoma of thigh    Left   Normal cardiac stress test 2014   PONV (postoperative nausea and vomiting)    nausea after hysterectomy, and 2 days after lung surgery 10/2015   Thyroid cancer ([place]) 12/2015   Thyroid nodule     PAST SURGICAL HISTORY :   Past Surgical History:  Procedure Laterality Date   ABDOMINAL HYSTERECTOMY     BREAST EXCISIONAL BIOPSY Right 1977   BREAST MASS EXCISION  1977   benign   CARPAL  TUNNEL RELEASE Right 1978   CATARACT EXTRACTION, BILATERAL Bilateral    CHOLECYSTECTOMY  11/23/2016   Procedure: LAPAROSCOPIC CHOLECYSTECTOMY;  Surgeon: Clayburn Pert, MD;  Location: [facility];  Service: General;;   COLONOSCOPY  2009    COLONOSCOPY WITH PROPOFOL N/A 05/18/2019   Procedure: COLONOSCOPY WITH PROPOFOL;  Surgeon: Virgel Manifold, MD;  Location: [facility];  Service: Endoscopy;  Laterality: N/A;   COLONOSCOPY WITH PROPOFOL N/A 08/15/2019   Procedure: COLONOSCOPY WITH PROPOFOL;  Surgeon: Virgel Manifold, MD;  Location: [facility];  Service: Endoscopy;  Laterality: N/A;   DILATION AND CURETTAGE OF UTERUS  2001   ELECTROMAGNETIC NAVIGATION BROCHOSCOPY N/A 08/25/2015   Procedure: ELECTROMAGNETIC NAVIGATION BRONCHOSCOPY;  Surgeon: Flora Lipps, MD;  Location: [facility];  Service: Cardiopulmonary;  Laterality: N/A;   ENDOBRONCHIAL ULTRASOUND N/A 08/25/2015   Procedure: ENDOBRONCHIAL ULTRASOUND;  Surgeon: Flora Lipps, MD;  Location: [facility];  Service: Cardiopulmonary;  Laterality: N/A;   ESOPHAGOGASTRODUODENOSCOPY  2002, 11/15/07   Normal (Dr. Allyn Kenner)   ESOPHAGOGASTRODUODENOSCOPY (EGD) WITH PROPOFOL N/A 05/18/2019   Procedure: ESOPHAGOGASTRODUODENOSCOPY (EGD) WITH PROPOFOL;  Surgeon: Virgel Manifold, MD;  Location: [facility];  Service: Endoscopy;  Laterality: N/A;   ESOPHAGOGASTRODUODENOSCOPY (EGD) WITH PROPOFOL N/A 08/15/2019   Procedure: ESOPHAGOGASTRODUODENOSCOPY (EGD) WITH PROPOFOL;  Surgeon: Virgel Manifold, MD;  Location: [facility];  Service: Endoscopy;  Laterality: N/A;   FACIAL COSMETIC SURGERY  01/13/10   mini facelift   HERNIA REPAIR  1976   OOPHORECTOMY     THORACOTOMY/LOBECTOMY Right 10/27/2015   Procedure: THORACOTOMY/LOBECTOMY;  Surgeon: Nestor Lewandowsky, MD;  Location: [facility];  Service: Thoracic;  Laterality: Right;   THYROIDECTOMY N/A 12/29/2015   Procedure: THYROIDECTOMY;  Surgeon: Beverly Gust, MD;  Location: [facility];  Service: ENT;  Laterality: N/A;   TOTAL VAGINAL HYSTERECTOMY  12/30/08   for fibroid pain (Dr. Gertie Fey)    FAMILY HISTORY :   Family History  Problem Relation Age of Onset   Transient ischemic attack Mother    Hypertension Mother    Stroke Mother         mini strokes   Hypertension Sister    Cancer Sister        ovarian   Hypertension Sister    Hypertension Sister    Hypertension Sister    Breast cancer Neg Hx    Colon cancer Neg Hx     SOCIAL HISTORY:   Social History   Tobacco Use   Smoking status: Never   Smokeless tobacco: Never  Vaping Use   Vaping Use: Never used  Substance Use Topics   Alcohol use: No    Alcohol/week: 0.0 standard drinks   Drug use: No    ALLERGIES:  is allergic to atorvastatin, ciprofloxacin, epinephrine, metoprolol succinate, simvastatin, and sulfa antibiotics.  MEDICATIONS:  Current Outpatient Medications  Medication Sig Dispense Refill   acetaminophen (TYLENOL) 500 MG tablet Take 1,000 mg by mouth every 6 (six) hours as needed for mild pain, moderate pain or headache.     Ascorbic Acid (VITAMIN C) 500 MG tablet Take 2,000 mg by mouth daily. Reported on 02/04/2016     Biotin 1000 MCG tablet Take 1,000 mcg by mouth daily. Reported on 11/06/5100     Folic Acid-Vit H8-NID P82 (FA-VITAMIN B-6-VITAMIN B-12 PO) Take 1 tablet by mouth daily after breakfast. Reported on 02/04/2016     levothyroxine (SYNTHROID) 88 MCG tablet TAKE 1 TABLET BY MOUTH DAILY BEFORE BREAKFAST. 90 tablet 3   Lutein 20 MG CAPS Take 20 mg  by mouth daily.     omeprazole (PRILOSEC) 40 MG capsule TAKE 1 CAPSULE BY MOUTH EVERY DAY 90 capsule 1   Potassium Gluconate 595 MG CAPS Take 595 mg by mouth daily.      pravastatin (PRAVACHOL) 40 MG tablet TAKE 1 TABLET BY MOUTH EVERY DAY 90 tablet 3   aspirin EC 81 MG tablet Take 1 tablet (81 mg total) by mouth daily. (Patient not taking: Reported on 11/26/2021)     CALCIUM-VITAMIN D PO Take by mouth. 1 tab MWF (Patient not taking: Reported on 11/26/2021)     Coenzyme Q10 200 MG capsule Take 200 mg by mouth daily. (Patient not taking: Reported on 11/26/2021)     fluticasone (FLONASE) 50 MCG/ACT nasal spray Place 2 sprays into both nostrils daily. (Patient not taking: Reported on 11/26/2021) 16 mL 0    methocarbamol (ROBAXIN) 500 MG tablet Take 1 tablet (500 mg total) by mouth every 8 (eight) hours as needed for muscle spasms. (Patient not taking: Reported on 11/26/2021) 30 tablet 2   Omega-3 Fatty Acids (SUPER OMEGA 3 EPA/DHA) 1000 MG CAPS Take 1,000 mg by mouth daily with supper. (Patient not taking: Reported on 11/26/2021)     No current facility-administered medications for this visit.    PHYSICAL EXAMINATION: ECOG PERFORMANCE STATUS: 1 - Symptomatic but completely ambulatory  BP (!) 143/83 (BP Location: Right Arm, Patient Position: Sitting, Cuff Size: Normal)    Pulse 88    Temp 98.2 F (36.8 C) (Oral)    Ht 5\' 5"  (1.651 m)    Wt 126 lb 12.8 oz (57.5 kg)    SpO2 100%    BMI 21.10 kg/m   Filed Weights   11/26/21 1102  Weight: 126 lb 12.8 oz (57.5 kg)    Physical Exam HENT:     Head: Normocephalic and atraumatic.     Mouth/Throat:     Pharynx: No oropharyngeal exudate.  Eyes:     Pupils: Pupils are equal, round, and reactive to light.  Cardiovascular:     Rate and Rhythm: Normal rate and regular rhythm.  Pulmonary:     Effort: No respiratory distress.     Breath sounds: No wheezing.  Abdominal:     General: Bowel sounds are normal. There is no distension.     Palpations: Abdomen is soft. There is no mass.     Tenderness: There is no abdominal tenderness. There is no guarding or rebound.  Musculoskeletal:        General: No tenderness. Normal range of motion.     Cervical back: Normal range of motion and neck supple.  Skin:    General: Skin is warm.  Neurological:     Mental Status: She is alert and oriented to person, place, and time.  Psychiatric:        Mood and Affect: Affect normal.   LABORATORY DATA:  I have reviewed the data as listed    Component Value Date/Time   NA 139 11/24/2021 0938   NA 141 07/01/2013 0103   K 4.4 11/24/2021 0938   K 3.8 07/01/2013 0103   CL 102 11/24/2021 0938   CL 110 (H) 07/01/2013 0103   CO2 30 11/24/2021 0938   CO2 27  07/01/2013 0103   GLUCOSE 98 11/24/2021 0938   GLUCOSE 88 07/01/2013 0103   BUN 19 11/24/2021 0938   BUN 11 07/01/2013 0103   CREATININE 0.56 11/24/2021 0938   CREATININE 0.68 07/01/2013 0103   CALCIUM 9.3 11/24/2021 2263  CALCIUM 8.2 (L) 07/01/2013 0103   PROT 6.5 11/24/2021 0938   PROT 5.5 (L) 07/01/2013 0103   ALBUMIN 4.0 11/24/2021 0938   ALBUMIN 3.0 (L) 07/01/2013 0103   AST 20 11/24/2021 0938   AST 19 07/01/2013 0103   ALT 20 11/24/2021 0938   ALT 24 07/01/2013 0103   ALKPHOS 57 11/24/2021 0938   ALKPHOS 55 07/01/2013 0103   BILITOT 0.5 11/24/2021 0938   BILITOT 0.7 07/01/2013 0103   GFRNONAA >60 11/24/2021 0938   GFRNONAA >60 07/01/2013 0103   GFRAA >60 05/19/2019 0117   GFRAA >60 07/01/2013 0103    No results found for: SPEP, UPEP  Lab Results  Component Value Date   WBC 5.7 11/24/2021   NEUTROABS 3.4 11/24/2021   HGB 12.7 11/24/2021   HCT 38.8 11/24/2021   MCV 94.9 11/24/2021   PLT 136 (L) 11/24/2021      Chemistry      Component Value Date/Time   NA 139 11/24/2021 0938   NA 141 07/01/2013 0103   K 4.4 11/24/2021 0938   K 3.8 07/01/2013 0103   CL 102 11/24/2021 0938   CL 110 (H) 07/01/2013 0103   CO2 30 11/24/2021 0938   CO2 27 07/01/2013 0103   BUN 19 11/24/2021 0938   BUN 11 07/01/2013 0103   CREATININE 0.56 11/24/2021 0938   CREATININE 0.68 07/01/2013 0103      Component Value Date/Time   CALCIUM 9.3 11/24/2021 0938   CALCIUM 8.2 (L) 07/01/2013 0103   ALKPHOS 57 11/24/2021 0938   ALKPHOS 55 07/01/2013 0103   AST 20 11/24/2021 0938   AST 19 07/01/2013 0103   ALT 20 11/24/2021 0938   ALT 24 07/01/2013 0103   BILITOT 0.5 11/24/2021 0938   BILITOT 0.7 07/01/2013 0103       RADIOGRAPHIC STUDIES: I have personally reviewed the radiological images as listed and agreed with the findings in the report. No results found.   ASSESSMENT & PLAN:  Cancer of lower lobe of right lung ([place]) # RLL Lung ca; Stage I;  no adjuvant therapy. July  2022-- CT scan- STABLE; Status post right lower lobectomy. No evidence of recurrent or metastatic disease.   #July 2022 CT scan:  stable 9 mm sub solid nodule in the right upper lobe. Continued annual follow-up is suggested.  Repeat imaging in 6 months.   # Thyroid cancer [FEB 2017] status post thyroidectomy low risk stage I; on Synthroid 88 mcg. JAN 2023--TSH- 0.8 [poor tolerance to higher dose of Synthroid- symptoms of fatigue/anxiety-july2022; detectable thyroglobulin/antithyroglobulin antibodies-however no clear trend.  Recommend thyroid ultrasound for further evaluation.  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  Monitor for now    DISPOSITION: mcyhart re: TG tests # follow up in 6 months-MD [-cbc/cmp/ thyroid profile/ Thyroiglobulin/and thyroglobulin antibodies- 2 week prior]- CT chest  - Dr.B     Orders Placed This Encounter  Procedures   CT CHEST WO CONTRAST    Standing Status:   Future    Standing Expiration Date:   11/26/2022    Order Specific Question:   Preferred imaging location?    Answer:   [facility]    Order Specific Question:   Radiology Contrast Protocol - do NOT remove file path    Answer:   [email or web address]   CBC with Differential/Platelet    Standing Status:   Future  Standing Expiration Date:   11/26/2022   Comprehensive metabolic panel    Standing Status:   Future    Standing Expiration Date:   11/26/2022   Thyroid Panel With TSH    Standing Status:   Future    Standing Expiration Date:   11/26/2022   TgAb+Thyroglobulin IMA or RIA    Standing Status:   Future    Standing Expiration Date:   11/26/2022   Thyroglobulin antibody    Standing Status:   Future    Standing Expiration Date:   11/26/2022   Thyroid Profile    Standing Status:   Future    Standing Expiration Date:   11/26/2022   All  questions were answered. The patient knows to call the clinic with any problems, questions or concerns.      Cammie Sickle, MD 12/09/2021 7:40 PM

## 2021-11-26 NOTE — Assessment & Plan Note (Addendum)
#   RLL Lung ca; Stage I;  no adjuvant therapy. July 2022-- CT scan- STABLE; Status post right lower lobectomy. No evidence of recurrent or metastatic disease.  #July 2022 CT scan:  stable 9 mm sub solid nodule in the right upper lobe. Continued annual follow-up is suggested.  Repeat imaging in 6 months.   # Thyroid cancer [FEB 2017] status post thyroidectomy low risk stage I; on Synthroid 88 mcg. JAN 2023--TSH- 0.8 [poor tolerance to higher dose of Synthroid- symptoms of fatigue/anxiety-july2022; detectable thyroglobulin/antithyroglobulin antibodies-however no clear trend.  Recommend thyroid ultrasound for further evaluation.  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  Monitor for now    DISPOSITION: mcyhart re: TG tests # follow up in 6 months-MD [-cbc/cmp/ thyroid profile/ Thyroiglobulin/and thyroglobulin antibodies- 2 week prior]- CT chest  - Dr.B  Addendum: Discussed with the patient the results of the thyro-globulin/antithyroglobulin antibodies-no clear trend.  Recommend continued dose of Synthroid 88 mcg given previous poor tolerance to higher doses.  Recommend thyroid ultrasound.  Follow-up as planned

## 2021-11-26 NOTE — Telephone Encounter (Signed)
It is suppose be [facility] sheet ill call pt about this.

## 2021-12-05 LAB — TGAB+THYROGLOBULIN IMA OR RIA: Thyroglobulin Antibody: 14.3 IU/mL — ABNORMAL HIGH (ref 0.0–0.9)

## 2021-12-05 LAB — THYROGLOBULIN BY RIA: Thyroglobulin by RIA: 6.7 ng/mL

## 2021-12-08 ENCOUNTER — Telehealth: Payer: Self-pay

## 2021-12-08 DIAGNOSIS — H02403 Unspecified ptosis of bilateral eyelids: Secondary | ICD-10-CM | POA: Diagnosis not present

## 2021-12-08 DIAGNOSIS — Z859 Personal history of malignant neoplasm, unspecified: Secondary | ICD-10-CM | POA: Diagnosis not present

## 2021-12-08 DIAGNOSIS — Z79890 Hormone replacement therapy (postmenopausal): Secondary | ICD-10-CM | POA: Diagnosis not present

## 2021-12-08 DIAGNOSIS — Z7982 Long term (current) use of aspirin: Secondary | ICD-10-CM | POA: Diagnosis not present

## 2021-12-08 DIAGNOSIS — Z79899 Other long term (current) drug therapy: Secondary | ICD-10-CM | POA: Diagnosis not present

## 2021-12-08 DIAGNOSIS — E039 Hypothyroidism, unspecified: Secondary | ICD-10-CM | POA: Diagnosis not present

## 2021-12-08 DIAGNOSIS — K219 Gastro-esophageal reflux disease without esophagitis: Secondary | ICD-10-CM | POA: Diagnosis not present

## 2021-12-08 HISTORY — PX: PTOSIS REPAIR: SHX6568

## 2021-12-08 NOTE — Chronic Care Management (AMB) (Signed)
Chronic Care Management Pharmacy Assistant   Name: [name]  MRN: [id number] DOB: 1946-05-12  [name] is an 76 y.o. year old female who presents for his initial CCM visit with the clinical pharmacist.  Reason for Encounter: Initial Questions   Conditions to be addressed/monitored: HTN and HLD   Recent office visits:  10/05/21-PCP-Graham Duncan,MD-Patient presented for back pain. Try Methocarbamol 500mg  take 1 tablet every 8 hours as needed 07/01/21-PCP-Graham Duncan,MD-Patient presented for AWV-Would hold vitamin D and recheck lab in about 6 weeks.Discussed screenings,vaccines-no medication changes   Recent consult visits:  11/26/21-Oncology-Govinda Brahmanday,MD-Patient presented for follow up thyroid and lung cancer.No evidence of recurrent or metastatic disease.  11/09/21-Podiatry-Justin Fowler,DPM- Patient presented for follow up right toe pain.Accomodate this with padding and shoe gear changes.  10/22/21-[facility]-Sarah Covington,PA-Patient presented for cough,congestion-Discussed tessalon, flonase and antiviral medications should her testing be positive. Discussed quarantine recommendations. 08/20/21-Brenda Sandridge- no data found 10/134/22-Ophthalmology-Bradley Edison Pace- no data found 07/30/21-[facility]-Kristin Boddu, FNP-Patient presented for sore throat,cough,congestion,HA-Start Fluticasone 50mg  mcg/act 2 sprays both nostrils daily ,Benzonatate 100mg -take 1 capsule every 8 hours as needed.discharged to home  07/22/21-Ophthalmology-no data found 06/24/21-Ophthalmology-no data found  06/11/21-Dermatology-no data found   Hospital visits:  None in previous 6 months  Medications: Outpatient Encounter Medications as of 12/08/2021  Medication Sig   acetaminophen (TYLENOL) 500 MG tablet Take 1,000 mg by mouth every 6 (six) hours as needed for mild pain, moderate pain or headache.   Ascorbic Acid (VITAMIN C) 500 MG tablet Take 2,000 mg by  mouth daily. Reported on 02/04/2016   aspirin EC 81 MG tablet Take 1 tablet (81 mg total) by mouth daily. (Patient not taking: Reported on 11/26/2021)   Biotin 1000 MCG tablet Take 1,000 mcg by mouth daily. Reported on 02/04/2016   CALCIUM-VITAMIN D PO Take by mouth. 1 tab MWF (Patient not taking: Reported on 11/26/2021)   Coenzyme Q10 200 MG capsule Take 200 mg by mouth daily. (Patient not taking: Reported on 11/26/2021)   fluticasone (FLONASE) 50 MCG/ACT nasal spray Place 2 sprays into both nostrils daily. (Patient not taking: Reported on 3/83/2919)   Folic Acid-Vit T6-OMA Y04 (FA-VITAMIN B-6-VITAMIN B-12 PO) Take 1 tablet by mouth daily after breakfast. Reported on 02/04/2016   levothyroxine (SYNTHROID) 88 MCG tablet TAKE 1 TABLET BY MOUTH DAILY BEFORE BREAKFAST.   Lutein 20 MG CAPS Take 20 mg by mouth daily.   methocarbamol (ROBAXIN) 500 MG tablet Take 1 tablet (500 mg total) by mouth every 8 (eight) hours as needed for muscle spasms. (Patient not taking: Reported on 11/26/2021)   Omega-3 Fatty Acids (SUPER OMEGA 3 EPA/DHA) 1000 MG CAPS Take 1,000 mg by mouth daily with supper. (Patient not taking: Reported on 11/26/2021)   omeprazole (PRILOSEC) 40 MG capsule TAKE 1 CAPSULE BY MOUTH EVERY DAY   Potassium Gluconate 595 MG CAPS Take 595 mg by mouth daily.    pravastatin (PRAVACHOL) 40 MG tablet TAKE 1 TABLET BY MOUTH EVERY DAY   [DISCONTINUED] Calcium Carbonate (CALCIUM 500 PO) Take 1 tablet by mouth every morning.   [DISCONTINUED] calcium gluconate 500 MG tablet Take 500 mg by mouth daily.   [DISCONTINUED] metoprolol succinate (TOPROL-XL) 25 MG 24 hr tablet Take 25 mg by mouth at bedtime.   [DISCONTINUED] simvastatin (ZOCOR) 40 MG tablet Take 40 mg by mouth at bedtime.   No facility-administered encounter medications on file as of 12/08/2021.    Lab Results  Component Value Date/Time   MICROALBUR 3.0 (H) 03/16/2011 12:05  PM   MICROALBUR 0.9 02/11/2010 08:34 AM     BP Readings from Last 3  Encounters:  11/26/21 (!) 143/83  10/22/21 (!) 148/88  10/05/21 120/70    Patient contacted to review initial questions prior to visit with Charlene Brooke.  Have you seen any other providers since your last visit with PCP? YesOncology, Podiatry  Any changes in your medications or health? Yes Patient had surgery 12/08/21- and the patient reported to me that she does not have hypertensionI did  Any side effects from any medications? No  Do you have an symptoms or problems not managed by your medications? No  Any concerns about your health right now? No  Has your provider asked that you check blood pressure, blood sugar, or follow special diet at home? No  Do you get any type of exercise on a regular basis? No  Can you think of a goal you would like to reach for your health? No  Do you have any problems getting your medications? No [facility] [place]   Is there anything that you would like to discuss during the appointment? No   Spoke with patient and reminded them to have all medications, supplements and any blood glucose and blood pressure readings available for review with pharmacist, at their telephone visit on 12/13/21 at 2:00pm.   Star Rating Drugs:  Medication:  Last Fill: Day Supply Pravastatin 40mg  09/24/21 90   Care Gaps: Annual wellness visit in last year? Yes Most Recent BP reading:143/83  88-P 11/26/21   Marjo Bicker CPP notified  [name], [place] Assistant [phone]  Total time spent for month CPA: 40 min

## 2021-12-09 ENCOUNTER — Telehealth: Payer: Self-pay | Admitting: *Deleted

## 2021-12-09 ENCOUNTER — Telehealth: Payer: Self-pay | Admitting: Internal Medicine

## 2021-12-09 DIAGNOSIS — C73 Malignant neoplasm of thyroid gland: Secondary | ICD-10-CM

## 2021-12-09 NOTE — Telephone Encounter (Signed)
Patient called asking for results of labs not available at her office visit and was told someone would call her with results.  Thyroglobulin by RIA Order: [id number] Status: Final result    Visible to patient: Yes (seen)    Next appt: 12/13/2021 at 02:00 PM in Family Medicine ([facility] CCM PHARMACIST 2)    0 Result Notes       Component Ref Range & Units 2 wk ago 7 mo ago 1 yr ago  Thyroglobulin by RIA ng/mL 6.7  8.8 CM  8.1 CM   Comment: (NOTE)  This test was developed and its performance characteristics  determined by [facility]. It has not been cleared or approved  by the Food and Drug Administration.  Reference Range:  Pubertal Children  and Adults: <40  According to the [facility],  the reference interval for Thyroglobulin (TG) should be  related to euthyroid patients and not for patients who  underwent thyroidectomy.  TG reference intervals for these  patients depend on the residual mass of the thyroid tissue  left after surgery.  Establishing a post-operative baseline  is recommended.  The assay quantitation limit is 2.0 ng/mL.  Performed At: [place], [place] [id number]  Jake Bathe F MD ZD:[id number]   Resulting Agency  [facility] CLIN LAB [facility] CLIN LAB [facility]         Specimen Collected: 11/24/21 09:38 Last Resulted: 12/05/21 02:35      Lab Flowsheet    Order Details    View Encounter    Lab and Collection Details    Routing    Result History    View Encounter Conversation      CM=Additional comments      Result Care Coordination   Patient Communication   Add Comments   Seen Back to Top       Other Results from 11/24/2021   Contains abnormal data TgAb+Thyroglobulin IMA or RIA Order: [id number] Status: Final result    Visible to patient: Yes (seen)    Next appt: 12/13/2021 at 02:00 PM in Family Medicine ([facility] CCM PHARMACIST 2)    Dx: Cancer of lower lobe of right lung ([place])    0 Result  Notes       Component Ref Range & Units 2 wk ago 7 mo ago 1 yr ago  Thyroglobulin Antibody 0.0 - 0.9 IU/mL 14.3 High   18.5 High  CM  15.0 High  CM   Comment: (NOTE)  Thyroglobulin Antibody measured by USAA Methodology  Performed At: [facility]  [address] [place], [place] [id number]  Rush Farmer MD [id number]   Resulting Agency  [facility] CLIN LAB [facility] CLIN LAB [facility] CLIN LAB         Specimen Collected: 11/24/21 09:38 Last Resulted: 12/05/21 02:35      Lab Flowsheet    Order Details    View Encounter    Lab and Collection Details    Routing    Result History    View Encounter Conversation      CM=Additional comments      Result Care Coordination   Patient Communication   Add Comments   Seen Back to Top         Thyroid Panel With TSH Order: [id number] Status: Final result    Visible to patient: Yes (seen)    Next appt: 12/13/2021 at 02:00 PM in Family Medicine ([facility] SS CCM PHARMACIST 2)  Dx: Cancer of lower lobe of right lung ([place])    0 Result Notes           Component Ref Range & Units 2 wk ago (11/24/21) 7 mo ago (05/13/21) 1 yr ago (11/13/20) 2 yr ago (05/17/19) 4 yr ago (03/20/17) 5 yr ago (11/11/16) 5 yr ago (08/11/16)  TSH 0.450 - 4.500 uIU/mL 0.896  0.351 Low   0.607  1.261 R, CM  0.266 Low  R, CM  0.092 Low   0.124 Low    T4, Total 4.5 - 12.0 ug/dL 8.7  8.2  8.7    8.3  9.3   T3 Uptake Ratio 24 - 39 % 30  29  30     32  33   Free Thyroxine Index 1.2 - 4.9 2.6  2.4 CM  2.6 CM    2.7 CM  3.1 CM   Comment: (NOTE)  Performed At: [facility]  [address] [place], [place] [id number]  Rush Farmer MD [id number]   Resulting Agency  [facility] CLIN LAB [place] CLIN LAB [place] CLIN LAB [place] CL[facility] LAB [place] CLIN LAB [place] CL[facility] LAB [place] [facility] LAB         Specimen Collected: 11/24/21 09:38 Last Resulted: 11/25/21 06:37      Lab Flowsheet    Order Details    View Encounter    Lab and Collection Details    Routing    Result History    View Encounter  Conversation      CM=Additional comments  R=Reference range differs from displayed range      Result Care Coordination   Patient Communication   Add Comments   Seen Back to Top         Comprehensive metabolic panel Order: [id number] Status: Final result    Visible to patient: Yes (seen)    Next appt: 12/13/2021 at 02:00 PM in Family Medicine ([facility] CCM PHARMACIST 2)    Dx: Cancer of lower lobe of right lung ([place])    0 Result Notes           Component Ref Range & Units 2 wk ago (11/24/21) 7 mo ago (05/13/21) 9 mo ago (03/01/21) 1 yr ago (11/13/20) 2 yr ago (06/24/19) 2 yr ago (05/27/19) 2 yr ago (05/19/19)  Sodium 135 - 145 mmol/L 139  138  139  138  140 R  140 R  140   Potassium 3.5 - 5.1 mmol/L 4.4  4.4  3.9  4.3  4.7 R  4.2 R  3.6   Chloride 98 - 111 mmol/L 102  104  103  102  105 R  104 R  111   CO2 22 - 32 mmol/L 30  28  27  30  28  R  29 R  24   Glucose, Bld 70 - 99 mg/dL 98  108 High  CM  114 High  CM  108 High  CM  97  111 High   90   Comment: Glucose reference range applies only to samples taken after fasting for at least 8 hours.  BUN 8 - 23 mg/dL 19  21  16  18  16  R  17 R  11   Creatinine, Ser 0.44 - 1.00 mg/dL 0.56  0.59  0.64  0.65  0.72 R  0.71 R  0.50   Calcium 8.9 - 10.3 mg/dL 9.3  9.0  9.0  9.1  9.2 R  9.1 R  7.7 Low  Total Protein 6.5 - 8.1 g/dL 6.5  6.5  6.4 Low   6.4 Low   6.7 R     Albumin 3.5 - 5.0 g/dL 4.0  3.9  4.1  4.0  4.6 R     AST 15 - 41 U/L 20  21  23  25  18  R     ALT 0 - 44 U/L 20  26  27   34  14 R     Alkaline Phosphatase 38 - 126 U/L 57  51  48  48  51 R     Total Bilirubin 0.3 - 1.2 mg/dL 0.5  0.9  0.7  0.7  0.6 R     GFR, Estimated >60 mL/min >60  >60 CM  >60 CM  >60 CM      Comment: (NOTE)  Calculated using the CKD-EPI Creatinine Equation (2021)   Anion gap 5 - 15 7  6  CM  9 CM  6 CM    5 CM   Comment: Performed at [facility], [address] [place], [place] [address]  Resulting Agency  [facility] CLIN LAB [place] CLIN LAB [place] CLIN LAB [place]  CLIN [place] CLIN LAB         Specimen Collected: 11/24/21 09:38 Last Resulted: 11/24/21 09:59      Lab Flowsheet    Order Details    View Encounter    Lab and Collection Details    Routing    Result History    View Encounter Conversation      CM=Additional comments  R=Reference range differs from displayed range      Result Care Coordination   Patient Communication   Add Comments   Seen Back to Top          Contains abnormal data CBC with Differential/Platelet Order: [id number] Status: Final result    Visible to patient: Yes (seen)    Next appt: 12/13/2021 at 02:00 PM in Family Medicine ([facility] SS CCM PHARMACIST 2)    Dx: Cancer of lower lobe of right lung ([place])    0 Result Notes           Component Ref Range & Units 2 wk ago (11/24/21) 7 mo ago (05/13/21) 9 mo ago (03/01/21) 11 mo ago (01/03/21) 1 yr ago (11/13/20) 2 yr ago (06/24/19) 2 yr ago (05/27/19)  WBC 4.0 - 10.5 K/uL 5.7  6.0  5.4  5.8  6.4  6.3  6.0   RBC 3.87 - 5.11 MIL/uL 4.09  4.14  4.30  3.90  4.08  4.60 R  4.01 R   Hemoglobin 12.0 - 15.0 g/dL 12.7  12.9  13.3  12.0  12.5  13.9  12.3   HCT 36.0 - 46.0 % 38.8  39.4  39.8  36.7  38.3  42.2  36.8   MCV 80.0 - 100.0 fL 94.9  95.2  92.6  94.1  93.9  91.6 R  91.6 R   MCH 26.0 - 34.0 pg 31.1  31.2  30.9  30.8  30.6     MCHC 30.0 - 36.0 g/dL 32.7  32.7  33.4  32.7  32.6  32.9  33.6   RDW 11.5 - 15.5 % 14.3  14.3  14.0  14.6  14.2  15.0  14.6   Platelets 150 - 400 K/uL 136 Low   146 Low   146 Low   141 Low   137 Low  CM  122.0 Repeated and  verified X2. Low  R, CM  171.0 R   Comment: SPECIMEN CHECKED FOR CLOTS  nRBC 0.0 - 0.2 % 0.0  0.0  0.0  0.0  0.0     Neutrophils Relative % % 59  65  56  61  71  64.2 R  67.3 R   Neutro Abs 1.7 - 7.7 K/uL 3.4  3.9  3.0  3.6  4.6  4.1 R  4.1 R   Lymphocytes Relative % 29  26  34  25  19  27.0 R  24.1 R   Lymphs Abs 0.7 - 4.0 K/uL 1.7  1.6  1.8  1.4  1.2  1.7  1.5   Monocytes Relative % 9  7  7  11  7    6.5 R  6.7 R   Monocytes Absolute 0.1 - 1.0 K/uL 0.5  0.4  0.4  0.6  0.4  0.4  0.4   Eosinophils Relative % 2  1  2  1  1   1.3 R  1.1 R   Eosinophils Absolute 0.0 - 0.5 K/uL 0.1  0.1  0.1  0.1  0.1  0.1 R  0.1 R   Basophils Relative % 1  1  1  1  1   1.0 R  0.8 R   Basophils Absolute 0.0 - 0.1 K/uL 0.1  0.1  0.1  0.0  0.0  0.1  0.0   Immature Granulocytes % 0  0  0  1  1     Abs Immature Granulocytes 0.00 - 0.07 K/uL 0.02  0.02 CM  0.02 CM  0.03 CM  0.03 CM     Comment: Performed at [facility], [address] [place], [place] [address]  Resulting Agency  [facility] CLIN LAB [place] CLIN LAB [place] CLIN LAB [place] CLIN LAB [place] [facility]IN [place]         Specimen Collected: 11/24/21 09:38 Last Resulted: 11/24/21 09:51

## 2021-12-09 NOTE — Telephone Encounter (Signed)
Please schedule thyroid ultrasound for the patient in next 1-2 weeks. GB

## 2021-12-10 NOTE — Telephone Encounter (Signed)
Schd.

## 2021-12-13 ENCOUNTER — Ambulatory Visit (INDEPENDENT_AMBULATORY_CARE_PROVIDER_SITE_OTHER): Payer: PPO | Admitting: Pharmacist

## 2021-12-13 ENCOUNTER — Other Ambulatory Visit: Payer: Self-pay | Admitting: Internal Medicine

## 2021-12-13 ENCOUNTER — Other Ambulatory Visit: Payer: Self-pay

## 2021-12-13 DIAGNOSIS — M8580 Other specified disorders of bone density and structure, unspecified site: Secondary | ICD-10-CM

## 2021-12-13 DIAGNOSIS — K219 Gastro-esophageal reflux disease without esophagitis: Secondary | ICD-10-CM

## 2021-12-13 DIAGNOSIS — E785 Hyperlipidemia, unspecified: Secondary | ICD-10-CM

## 2021-12-13 DIAGNOSIS — E039 Hypothyroidism, unspecified: Secondary | ICD-10-CM

## 2021-12-13 DIAGNOSIS — E7800 Pure hypercholesterolemia, unspecified: Secondary | ICD-10-CM

## 2021-12-13 DIAGNOSIS — I700 Atherosclerosis of aorta: Secondary | ICD-10-CM

## 2021-12-13 NOTE — Progress Notes (Signed)
Chronic Care Management Pharmacy Note  12/13/2021 Name:  [name] MRN:  [id number] DOB:  May 24, 1946  Summary: CCM Initial visit -Pt reports compliance with medications as prescribed -BP was slightly elevated (143/83) in recent office visit, probably due to white coat syndrome; pt is not checking BP at home regularly and takes no BP medication  Recommendations/Changes made from today's visit: -Advised pt to check BP 2-3x per week at home to establish average/baseline; will f/u 1 month for readings  Plan: -[facility] will call patient 1 month for BP log -Pharmacist follow up televisit scheduled for 1 year -PCP annual visit due 07/2022   Subjective: [name] is an 76 y.o. year old female who is a primary patient of Damita Dunnings, Elveria Rising, MD.  The CCM team was consulted for assistance with disease management and care coordination needs.    Engaged with patient by telephone for initial visit in response to provider referral for pharmacy case management and/or care coordination services.   Consent to Services:  The patient was given the following information about Chronic Care Management services today, agreed to services, and gave verbal consent: 1. CCM service includes personalized support from designated clinical staff supervised by the primary care provider, including individualized plan of care and coordination with other care providers 2. 24/7 contact phone numbers for assistance for urgent and routine care needs. 3. Service will only be billed when office clinical staff spend 20 minutes or more in a month to coordinate care. 4. Only one practitioner may furnish and bill the service in a calendar month. 5.The patient may stop CCM services at any time (effective at the end of the month) by phone call to the office staff. 6. The patient will be responsible for cost sharing (co-pay) of up to 20% of the service fee (after annual deductible is met). Patient agreed to services and  consent obtained.  Patient Care Team: Tonia Ghent, MD as PCP - General (Family Medicine) Kate Sable, MD as PCP - Cardiology (Cardiology) Beverly Gust, MD as Consulting Physician (Otolaryngology) Nestor Lewandowsky, MD (Inactive) as Consulting Physician (Cardiothoracic Surgery) Cammie Sickle, MD as Consulting Physician (Internal Medicine) Birder Robson, MD as Consulting Physician (Ophthalmology) Clayburn Pert, MD as Consulting Physician (General Surgery) Charlton Haws, [facility] as Pharmacist (Pharmacist)  Recent office visits: 10/05/21-PCP-Graham Duncan,MD-Patient presented for back pain. Try Methocarbamol 540m take 1 tablet every 8 hours as needed  07/01/21-PCP-Graham Duncan,MD-Patient presented for AWV-Would hold vitamin D and recheck lab in about 6 weeks.Discussed screenings,vaccines-no medication changes   Recent consult visits: 12/08/21 eyelid surgery 11/26/21-Oncology-Govinda Brahmanday,MD-Patient presented for follow up thyroid and lung cancer.No evidence of recurrent or metastatic disease.  11/09/21-Podiatry-Justin Fowler,DPM- Patient presented for follow up right toe pain.Accomodate this with padding and shoe gear changes.  10/22/21-[facility]-Covid exposure.-Discussed tessalon, flonase and antiviral medications should her testing be positive. Discussed quarantine recommendations. 08/20/21-Brenda Sandridge- no data found 10/134/22-Ophthalmology-Bradley KEdison Pace no data found 07/30/21-[facility]-Kristin Boddu, FNP-Patient presented for sore throat,cough,congestion,HA-Start Flonase, benzonatate. 07/22/21-Ophthalmology - Dr KEdison Pace F/u cataracts. 06/11/21-Dermatology-no data found  Hospital visits: None in previous 6 months   Objective:  Lab Results  Component Value Date   CREATININE 0.56 11/24/2021   BUN 19 11/24/2021   GFR 78.94 06/24/2019   GFRNONAA >60 11/24/2021   GFRAA >60 05/19/2019   NA 139 11/24/2021   K 4.4  11/24/2021   CALCIUM 9.3 11/24/2021   CO2 30 11/24/2021   GLUCOSE 98 11/24/2021    Lab Results  Component Value Date/Time   GFR 78.94 06/24/2019 07:44 AM   GFR 80.25 05/27/2019 12:27 PM   MICROALBUR 3.0 (H) 03/16/2011 12:05 PM   MICROALBUR 0.9 02/11/2010 08:34 AM    Last diabetic Eye exam: No results found for: HMDIABEYEEXA  Last diabetic Foot exam: No results found for: HMDIABFOOTEX   Lab Results  Component Value Date   CHOL 161 06/25/2021   HDL 45.30 06/25/2021   LDLCALC 88 06/25/2021   LDLDIRECT 83.8 03/11/2011   TRIG 140.0 06/25/2021   CHOLHDL 4 06/25/2021    Hepatic Function Latest Ref Rng & Units 11/24/2021 05/13/2021 03/01/2021  Total Protein 6.5 - 8.1 g/dL 6.5 6.5 6.4(L)  Albumin 3.5 - 5.0 g/dL 4.0 3.9 4.1  AST 15 - 41 U/L '20 21 23  ' ALT 0 - 44 U/L '20 26 27  ' Alk Phosphatase 38 - 126 U/L 57 51 48  Total Bilirubin 0.3 - 1.2 mg/dL 0.5 0.9 0.7  Bilirubin, Direct 0.0 - 0.3 mg/dL - - -    Lab Results  Component Value Date/Time   TSH 0.896 11/24/2021 09:38 AM   TSH 0.351 (L) 05/13/2021 11:24 AM    CBC Latest Ref Rng & Units 11/24/2021 05/13/2021 03/01/2021  WBC 4.0 - 10.5 K/uL 5.7 6.0 5.4  Hemoglobin 12.0 - 15.0 g/dL 12.7 12.9 13.3  Hematocrit 36.0 - 46.0 % 38.8 39.4 39.8  Platelets 150 - 400 K/uL 136(L) 146(L) 146(L)    Lab Results  Component Value Date/Time   VD25OH 68.71 08/11/2021 08:56 AM   VD25OH >120 06/25/2021 09:01 AM    Clinical ASCVD: No  The 10-year ASCVD risk score (Arnett DK, et al., 2019) is: 23.1%   Values used to calculate the score:     Age: 32 years     Sex: Female     Is Non-Hispanic African American: No     Diabetic: No     Tobacco smoker: No     Systolic Blood Pressure: 694 mmHg     Is BP treated: No     HDL Cholesterol: 45.3 mg/dL     Total Cholesterol: 161 mg/dL    Depression screen [facility] 2/9 07/01/2021 06/10/2020 06/27/2019  Decreased Interest 0 0 0  Down, Depressed, Hopeless 0 0 0  PHQ - 2 Score 0 0 0  Altered sleeping 2 0 -  Tired,  decreased energy 1 0 -  Change in appetite 0 0 -  Feeling bad or failure about yourself  0 0 -  Trouble concentrating 0 0 -  Moving slowly or fidgety/restless 0 0 -  Suicidal thoughts 0 0 -  PHQ-9 Score 3 0 -  Difficult doing work/chores - Not difficult at all -  Some recent data might be hidden    Social History   Tobacco Use  Smoking Status Never  Smokeless Tobacco Never   BP Readings from Last 3 Encounters:  11/26/21 (!) 143/83  10/22/21 (!) 148/88  10/05/21 120/70   Pulse Readings from Last 3 Encounters:  11/26/21 88  10/22/21 (!) 111  10/05/21 75   Wt Readings from Last 3 Encounters:  11/26/21 126 lb 12.8 oz (57.5 kg)  10/05/21 127 lb (57.6 kg)  07/01/21 129 lb (58.5 kg)   BMI Readings from Last 3 Encounters:  11/26/21 21.10 kg/m  10/05/21 21.13 kg/m  07/01/21 21.47 kg/m    Assessment/Interventions: Review of patient past medical history, allergies, medications, health status, including review of consultants reports, laboratory and other test data, was performed as part of comprehensive  evaluation and provision of chronic care management services.   SDOH:  (Social Determinants of Health) assessments and interventions performed: Yes SDOH Interventions    Flowsheet Row Most Recent Value  SDOH Interventions   Food Insecurity Interventions Intervention Not Indicated  Financial Strain Interventions Intervention Not Indicated      SDOH Screenings   Alcohol Screen: Not on file  Depression (PHQ2-9): Low Risk    PHQ-2 Score: 3  Financial Resource Strain: Low Risk    Difficulty of Paying Living Expenses: Not hard at all  Food Insecurity: No Food Insecurity   Worried About Charity fundraiser in the Last Year: Never true   Ran Out of Food in the Last Year: Never true  Housing: Not on file  Physical Activity: Not on file  Social Connections: Not on file  Stress: Not on file  Tobacco Use: Low Risk    Smoking Tobacco Use: Never   Smokeless Tobacco Use: Never    Passive Exposure: Not on file  Transportation Needs: Not on file    CCM Care Plan  Allergies  Allergen Reactions   Atorvastatin     REACTION: myalgias   Ciprofloxacin     REACTION: increase reflux   Epinephrine     Heart racing with dental injection   Metoprolol Succinate Swelling    Swelling and stiffness in hands and ankles   Simvastatin Other (See Comments)    Muscle aches and stiffness   Sulfa Antibiotics Other (See Comments)    REACTION: itching    Medications Reviewed Today     Reviewed by Charlton Haws, [facility] (Pharmacist) on 12/13/21 at 1443  Med List Status: <None>   Medication Order Taking? Sig Documenting Provider Last Dose Status Informant  acetaminophen (TYLENOL) 500 MG tablet [id number] Yes Take 1,000 mg by mouth every 6 (six) hours as needed for mild pain, moderate pain or headache. [provider] Taking Active   Ascorbic Acid (VITAMIN C) 500 MG tablet [id number] Yes Take 2,000 mg by mouth daily. Reported on 02/04/2016 [provider] Taking Active Pharmacy Records  aspirin EC 81 MG tablet [id number] Yes Take 1 tablet (81 mg total) by mouth daily. Tonia Ghent, MD Taking Active   Biotin 1000 MCG tablet [id number] Yes Take 1,000 mcg by mouth daily. Reported on 02/04/2016 [provider] Taking Active Pharmacy Records           Med Note Perry Mount, AMBER G   Fri Aug 12, 2016 10:20 AM)      Discontinued 12/30/15 1022 (Stop Taking at Discharge)   Discontinued 01/10/12 1125 (Error)   CALCIUM-VITAMIN D PO [id number] Yes Take by mouth. 1 tab MWF [provider] Taking Active   Coenzyme Q10 200 MG capsule [id number] Yes Take 200 mg by mouth daily. [provider] Taking Active Pharmacy Records  fluticasone [facility]) 50 MCG/ACT nasal spray [id number] Yes Place 2 sprays into both nostrils daily. Hughie Closs, PA-C Taking Active   Folic Acid-Vit T0-VXB L39 (FA-VITAMIN B-6-VITAMIN B-12 PO) [id number] Yes Take 1 tablet by mouth daily  after breakfast. Reported on 02/04/2016 [provider] Taking Active Pharmacy Records           Med Note Perry Mount, AMBER G   Fri Aug 12, 2016 10:20 AM)    levothyroxine (SYNTHROID) 88 MCG tablet [id number] Yes TAKE 1 TABLET BY MOUTH DAILY BEFORE BREAKFAST. Cammie Sickle, MD Taking Active   Lutein 20 MG CAPS [id number] Yes Take 20 mg by mouth daily. [provider] Taking Active Pharmacy Records  methocarbamol (ROBAXIN) 500 MG tablet [id number] Yes Take 1 tablet (500 mg total) by mouth every 8 (eight) hours as needed for muscle spasms. Tonia Ghent, MD Taking Active     Discontinued 01/10/12 1126 (Error)   Omega-3 Fatty Acids (SUPER OMEGA 3 EPA/DHA) 1000 MG CAPS [id number] Yes Take 1,000 mg by mouth daily with supper. [provider] Taking Active Pharmacy Records  omeprazole (PRILOSEC) 40 MG capsule [id number] Yes TAKE 1 CAPSULE BY MOUTH EVERY DAY Tonia Ghent, MD Taking Active   Potassium Gluconate 595 MG CAPS [id number] Yes Take 595 mg by mouth daily.  [provider] Taking Active Pharmacy Records  pravastatin (PRAVACHOL) 40 MG tablet [id number] Yes TAKE 1 TABLET BY MOUTH EVERY DAY Tonia Ghent, MD Taking Active     Discontinued 01/10/12 1127 (Error)             Patient Active Problem List   Diagnosis Date Noted   Headache 10/06/2021   High vitamin D level 07/05/2021   Other social stressor 10/21/2020   Hypothyroidism, unspecified 07/02/2020   Wart 07/02/2020   Dermatitis 02/20/2020   Stress reaction 02/20/2020   Polyp of transverse colon    Gastric polyp    Stomach irritation    Healthcare maintenance 06/30/2019   GI bleed 05/17/2019   Upper back pain 04/14/2018   Aortic atherosclerosis ([place]) 05/31/2017   Cancer of lower lobe of right lung ([place]) 05/06/2016   Thyroid cancer ([place]) 09/11/2015   Advance care planning 05/14/2014   Thrombocytopenia ([place]) 07/03/2013   Osteopenia 05/05/2013   Medicare annual wellness visit, subsequent  04/26/2012   GERD (gastroesophageal reflux disease) 04/26/2012   Essential hypertension 11/17/2010   HLD (hyperlipidemia) 01/31/2008   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    Immunization History  Administered Date(s) Administered   Fluad Quad(high Dose 65+) 06/27/2019, 07/01/2021   Influenza Split 08/10/2012   Influenza, High Dose Seasonal PF 08/01/2014, 08/10/2016, 07/18/2017, 07/12/2018, 07/29/2020   Influenza,inj,Quad PF,6+ Mos 08/13/2015   Influenza-Unspecified 07/01/2013, 07/01/2014, 07/18/2017   PFIZER(Purple Top)SARS-COV-2 Vaccination 11/21/2019, 12/12/2019, 11/12/2020   Pneumococcal Conjugate-13 05/13/2014   Pneumococcal Polysaccharide-23 02/17/2010   Td 02/11/2009   Zoster, Live 05/07/2010    Conditions to be addressed/monitored:  Hypertension, Hyperlipidemia, GERD, Hypothyroidism, and Osteopenia  Care Plan : [place]  Updates made by Charlton Haws, [place] since 12/13/2021 12:00 AM     Problem: Hypertension, Hyperlipidemia, GERD, Hypothyroidism, and Osteopenia   Priority: High     Long-Range Goal: Disease mgmt   Start Date: 12/13/2021  Expected End Date: 12/13/2022  This Visit's Progress: On track  Priority: High  Note:   Current Barriers:  Unable to independently monitor therapeutic efficacy  Pharmacist Clinical Goal(s):  Patient will achieve adherence to monitoring guidelines and medication adherence to achieve therapeutic efficacy through collaboration with PharmD and provider.   Interventions: 1:1 collaboration with Tonia Ghent, MD regarding development and update of comprehensive plan of care as evidenced by provider attestation and co-signature Inter-disciplinary care team collaboration (see longitudinal plan of care) Comprehensive medication review performed; medication list updated in electronic medical record  Hypertension (BP goal <140/90) -Not ideally controlled - BP was elevated in recent office visit  (143/83); pt is not checking regularly at home and reports history of white coat syndrome -Current treatment: None -Medications previously tried: metoprolol, lisinopril  -Current home readings: n/a -Educated on BP goals and benefits of medications for prevention of heart attack, stroke and  kidney damage; Importance of home blood pressure monitoring; -Counseled to monitor BP at home 2-3x weekly, document, and provide log at future appointments  Hyperlipidemia: (LDL goal < 70) -Not ideally controlled - LDL is above goal; pt has not tolerated other statins but does fine with pravastatin 40 mg;  -Hx aortic atherosclerosis -Current treatment: Pravastatin 40 mg daily PM - Appropriate, Query Effective, Safe, Accessible Coenzyme Q10 200 mg -Appropriate, Effective, Safe, Accessible Super Omega 3 1000 mg -Appropriate, Effective, Safe, Accessible Aspirin 81 mg daily -Appropriate, Effective, Safe, Accessible -Medications previously tried: simvastatin, atorvastatin -Educated on Cholesterol goals; Benefits of statin for ASCVD risk reduction; Importance of limiting foods high in cholesterol; -Recommended to continue current medication  Osteopenia (Goal prevent fractures) -Controlled  -Last DEXA Scan: 11/27/19 - recheck 2 years  T-Score femoral neck: -1.5  T-Score lumbar spine: -2.0  10-year probability of major osteoporotic fracture: 15.5%  10-year probability of hip fracture: 3.1% -Patient is a candidate for pharmacologic treatment due to T-Score -1.0 to -2.5 and 10-year risk of hip fracture > 3%. Bisphosphonate tx deferred previously due to ongoing GERD. -Current treatment  Calcium-Vitamin D TIW -Appropriate, Effective, Safe, Accessible -Medications previously tried: none  -Recommend weight-bearing and muscle strengthening exercises for building and maintaining bone density.  Hypothyroidism (Goal: maintain TSH in goal range) -Controlled - pt takes levothyroxine at 6am 30 min before other  meds/food; she has thyroid US tomorrow per oncology (recent elevated thryoglobulin Ab, other TFTs normal) -Hx thyroid cancer s/p thyroidectomy 2017. Unable to tolerate doses higher than 88 mcg. -Current treatment  Levothyroxine 88 mcg daily -Appropriate, Effective, Safe, Accessible -Recommended to continue current medication  GERD (Goal: manage symptoms) -Controlled - pt takes omeprazole 30 min after levothyroxine; she reports symptoms are mostly controlled, she has occasional breakthrough symptoms with certain foods -Current treatment  Omeprazole 40 mg daily AM -Appropriate, Effective, Safe, Accessible Tums PRN -Appropriate, Effective, Safe, Accessible -Recommended to continue current medication  Health Maintenance -Vaccine gaps: Covid booster, Shingrix -Hx lung cancer 2016, s/p lobectomy. No recurrence since. -Current therapy:  Folic [id number] Lutein 20 mg Vitamin C 2000 mg Biotin 1000 mcg Potassium gluconate 595 mg daily Fluticasone nasal spray Tylenol 500 mg  Methocarbamol 500 mg PRN -Patient is satisfied with current therapy and denies issues -Recommended to continue current medication  Patient Goals/Self-Care Activities Patient will:  - take medications as prescribed as evidenced by patient report and record review focus on medication adherence by routine check blood pressure 2-3 times weekly, document, and provide at future appointments      Medication Assistance: None required.  Patient affirms current coverage meets needs.  Compliance/Adherence/Medication fill history: Care Gaps: None  Star-Rating Drugs: Pravastatin - LF 09/24/21 x 90 ds; [place] 100%  Patient's preferred pharmacy is:  [id number]: [phone]: [phone] Uses pill box? Yes Pt endorses 100% compliance  We discussed: Benefits of medication synchronization, packaging and delivery as well as  enhanced pharmacist oversight with [employer]. Patient decided to: Continue current medication management strategy  Care Plan and Follow Up Patient Decision:  Patient agrees to Care Plan and Follow-up.  Plan: Telephone follow up appointment with care management team member scheduled for:  1 year  LCharlene Brooke PharmD, BWinnie Community HospitalClinical Pharmacist LShungnakPrimary Care at [phone]

## 2021-12-13 NOTE — Progress Notes (Signed)
[place] thryoid orderd/scheduled.  GB

## 2021-12-13 NOTE — Patient Instructions (Signed)
Visit Information  Phone number for Pharmacist: [phone]  Thank you for meeting with me to discuss your medications! I look forward to working with you to achieve your health care goals. Below is a summary of what we talked about during the visit:   Goals Addressed             This Visit's Progress    Track and Manage My Blood Pressure-Hypertension       Timeframe:  Long-Range Goal Priority:  Medium Start Date:       12/13/21                      Expected End Date:   12/13/22                    Follow Up Date Feb 2024   - check blood pressure 3 times per week - choose a place to take my blood pressure (home, clinic or office, retail store) - write blood pressure results in a log or diary    Why is this important?   You won't feel high blood pressure, but it can still hurt your blood vessels.  High blood pressure can cause heart or kidney problems. It can also cause a stroke.  Making lifestyle changes like losing a little weight or eating less salt will help.  Checking your blood pressure at home and at different times of the day can help to control blood pressure.  If the doctor prescribes medicine remember to take it the way the doctor ordered.  Call the office if you cannot afford the medicine or if there are questions about it.     Notes:         Care Plan : [place]  Updates made by Charlton Haws, RPH since 12/13/2021 12:00 AM     Problem: Hypertension, Hyperlipidemia, GERD, Hypothyroidism, and Osteopenia   Priority: High     Long-Range Goal: Disease mgmt   Start Date: 12/13/2021  Expected End Date: 12/13/2022  This Visit's Progress: On track  Priority: High  Note:   Current Barriers:  Unable to independently monitor therapeutic efficacy  Pharmacist Clinical Goal(s):  Patient will achieve adherence to monitoring guidelines and medication adherence to achieve therapeutic efficacy through collaboration with PharmD and provider.    Interventions: 1:1 collaboration with Tonia Ghent, MD regarding development and update of comprehensive plan of care as evidenced by provider attestation and co-signature Inter-disciplinary care team collaboration (see longitudinal plan of care) Comprehensive medication review performed; medication list updated in electronic medical record  Hypertension (BP goal <140/90) -Not ideally controlled - BP was elevated in recent office visit (143/83); pt is not checking regularly at home and reports history of white coat syndrome -Current treatment: None -Medications previously tried: metoprolol, lisinopril  -Current home readings: n/a -Educated on BP goals and benefits of medications for prevention of heart attack, stroke and kidney damage; Importance of home blood pressure monitoring; -Counseled to monitor BP at home 2-3x weekly, document, and provide log at future appointments  Hyperlipidemia: (LDL goal < 70) -Not ideally controlled - LDL is above goal; pt has not tolerated other statins but does fine with pravastatin 40 mg;  -Hx aortic atherosclerosis -Current treatment: Pravastatin 40 mg daily PM - Appropriate, Query Effective, Safe, Accessible Coenzyme Q10 200 mg -Appropriate, Effective, Safe, Accessible Super Omega 3 1000 mg -Appropriate, Effective, Safe, Accessible Aspirin 81 mg daily -Appropriate, Effective, Safe, Accessible -Medications previously tried: simvastatin,  atorvastatin -Educated on Cholesterol goals; Benefits of statin for ASCVD risk reduction; Importance of limiting foods high in cholesterol; -Recommended to continue current medication  Osteopenia (Goal prevent fractures) -Controlled  -Last DEXA Scan: 11/27/19 - recheck 2 years  T-Score femoral neck: -1.5  T-Score lumbar spine: -2.0  10-year probability of major osteoporotic fracture: 15.5%  10-year probability of hip fracture: 3.1% -Patient is a candidate for pharmacologic treatment due to T-Score -1.0 to -2.5  and 10-year risk of hip fracture > 3%. Bisphosphonate tx deferred previously due to ongoing GERD. -Current treatment  Calcium-Vitamin D TIW -Appropriate, Effective, Safe, Accessible -Medications previously tried: none  -Recommend weight-bearing and muscle strengthening exercises for building and maintaining bone density.  Hypothyroidism (Goal: maintain TSH in goal range) -Controlled - pt takes levothyroxine at 6am 30 min before other meds/food; she has thyroid US tomorrow per oncology (recent elevated thryoglobulin Ab, other TFTs normal) -Hx thyroid cancer s/p thyroidectomy 2017. Unable to tolerate doses higher than 88 mcg. -Current treatment  Levothyroxine 88 mcg daily -Appropriate, Effective, Safe, Accessible -Recommended to continue current medication  GERD (Goal: manage symptoms) -Controlled - pt takes omeprazole 30 min after levothyroxine; she reports symptoms are mostly controlled, she has occasional breakthrough symptoms with certain foods -Current treatment  Omeprazole 40 mg daily AM -Appropriate, Effective, Safe, Accessible Tums PRN -Appropriate, Effective, Safe, Accessible -Recommended to continue current medication  Health Maintenance -Vaccine gaps: Covid booster, Shingrix -Hx lung cancer 2016, s/p lobectomy. No recurrence since. -Current therapy:  Folic [id number] Lutein 20 mg Vitamin C 2000 mg Biotin 1000 mcg Potassium gluconate 595 mg daily Fluticasone nasal spray Tylenol 500 mg  Methocarbamol 500 mg PRN -Patient is satisfied with current therapy and denies issues -Recommended to continue current medication  Patient Goals/Self-Care Activities Patient will:  - take medications as prescribed as evidenced by patient report and record review focus on medication adherence by routine check blood pressure 2-3 times weekly, document, and provide at future appointments      Ms. Tappen was given information about Chronic Care Management services today including:  CCM  service includes personalized support from designated clinical staff supervised by her physician, including individualized plan of care and coordination with other care providers 24/7 contact phone numbers for assistance for urgent and routine care needs. Standard insurance, coinsurance, copays and deductibles apply for chronic care management only during months in which we provide at least 20 minutes of these services. Most insurances cover these services at 100%, however patients may be responsible for any copay, coinsurance and/or deductible if applicable. This service may help you avoid the need for more expensive face-to-face services. Only one practitioner may furnish and bill the service in a calendar month. The patient may stop CCM services at any time (effective at the end of the month) by phone call to the office staff.  Patient agreed to services and verbal consent obtained.   Patient verbalizes understanding of instructions and care plan provided today and agrees to view in [place]. Active MyChart status confirmed with patient.   Telephone follow up appointment with pharmacy team member scheduled for: 1 year  Charlene Brooke, PharmD, [facility] Clinical Pharmacist [facility] [phone]

## 2021-12-14 ENCOUNTER — Ambulatory Visit
Admission: RE | Admit: 2021-12-14 | Discharge: 2021-12-14 | Disposition: A | Discharge status: Home / Self Care | Payer: PPO | Source: Ambulatory Visit | Attending: Internal Medicine | Admitting: Internal Medicine

## 2021-12-14 ENCOUNTER — Encounter: Payer: Self-pay | Admitting: Internal Medicine

## 2021-12-14 DIAGNOSIS — C73 Malignant neoplasm of thyroid gland: Secondary | ICD-10-CM | POA: Diagnosis not present

## 2021-12-14 DIAGNOSIS — E890 Postprocedural hypothyroidism: Secondary | ICD-10-CM | POA: Diagnosis not present

## 2021-12-14 DIAGNOSIS — Z85850 Personal history of malignant neoplasm of thyroid: Secondary | ICD-10-CM | POA: Diagnosis not present

## 2021-12-15 NOTE — Telephone Encounter (Signed)
Dr. B sent patient [place] results in a MyChart result note.

## 2021-12-24 DIAGNOSIS — L821 Other seborrheic keratosis: Secondary | ICD-10-CM | POA: Diagnosis not present

## 2021-12-28 DIAGNOSIS — E039 Hypothyroidism, unspecified: Secondary | ICD-10-CM | POA: Diagnosis not present

## 2021-12-28 DIAGNOSIS — E785 Hyperlipidemia, unspecified: Secondary | ICD-10-CM | POA: Diagnosis not present

## 2021-12-28 DIAGNOSIS — E7800 Pure hypercholesterolemia, unspecified: Secondary | ICD-10-CM

## 2022-01-17 ENCOUNTER — Other Ambulatory Visit: Payer: Self-pay | Admitting: Family Medicine

## 2022-01-17 DIAGNOSIS — Z1231 Encounter for screening mammogram for malignant neoplasm of breast: Secondary | ICD-10-CM

## 2022-01-21 DIAGNOSIS — L538 Other specified erythematous conditions: Secondary | ICD-10-CM | POA: Diagnosis not present

## 2022-01-21 DIAGNOSIS — B078 Other viral warts: Secondary | ICD-10-CM | POA: Diagnosis not present

## 2022-01-31 ENCOUNTER — Encounter: Payer: Self-pay | Admitting: *Deleted

## 2022-01-31 ENCOUNTER — Encounter: Payer: Self-pay | Admitting: Family

## 2022-01-31 ENCOUNTER — Ambulatory Visit (INDEPENDENT_AMBULATORY_CARE_PROVIDER_SITE_OTHER): Payer: PPO | Admitting: Family

## 2022-01-31 VITALS — BP 139/78 | HR 86 | Temp 98.0°F | Resp 16 | Ht 65.0 in | Wt 127.4 lb

## 2022-01-31 DIAGNOSIS — E559 Vitamin D deficiency, unspecified: Secondary | ICD-10-CM | POA: Insufficient documentation

## 2022-01-31 DIAGNOSIS — R1111 Vomiting without nausea: Secondary | ICD-10-CM | POA: Diagnosis not present

## 2022-01-31 DIAGNOSIS — B370 Candidal stomatitis: Secondary | ICD-10-CM | POA: Diagnosis not present

## 2022-01-31 DIAGNOSIS — K146 Glossodynia: Secondary | ICD-10-CM

## 2022-01-31 DIAGNOSIS — K219 Gastro-esophageal reflux disease without esophagitis: Secondary | ICD-10-CM

## 2022-01-31 DIAGNOSIS — R29898 Other symptoms and signs involving the musculoskeletal system: Secondary | ICD-10-CM | POA: Diagnosis not present

## 2022-01-31 DIAGNOSIS — I83893 Varicose veins of bilateral lower extremities with other complications: Secondary | ICD-10-CM | POA: Insufficient documentation

## 2022-01-31 LAB — CBC WITH DIFFERENTIAL/PLATELET
Basophils Absolute: 0 10*3/uL (ref 0.0–0.1)
Basophils Relative: 0.4 % (ref 0.0–3.0)
Eosinophils Absolute: 0 10*3/uL (ref 0.0–0.7)
Eosinophils Relative: 0.4 % (ref 0.0–5.0)
HCT: 39 % (ref 36.0–46.0)
Hemoglobin: 12.9 g/dL (ref 12.0–15.0)
Lymphocytes Relative: 16.9 % (ref 12.0–46.0)
Lymphs Abs: 1.3 10*3/uL (ref 0.7–4.0)
MCHC: 33.2 g/dL (ref 30.0–36.0)
MCV: 93.6 fl (ref 78.0–100.0)
Monocytes Absolute: 0.4 10*3/uL (ref 0.1–1.0)
Monocytes Relative: 5.8 % (ref 3.0–12.0)
Neutro Abs: 5.7 10*3/uL (ref 1.4–7.7)
Neutrophils Relative %: 76.5 % (ref 43.0–77.0)
Platelets: 153 10*3/uL (ref 150.0–400.0)
RBC: 4.16 Mil/uL (ref 3.87–5.11)
RDW: 15 % (ref 11.5–15.5)
WBC: 7.5 10*3/uL (ref 4.0–10.5)

## 2022-01-31 LAB — BASIC METABOLIC PANEL
BUN: 16 mg/dL (ref 6–23)
CO2: 28 mEq/L (ref 19–32)
Calcium: 9.3 mg/dL (ref 8.4–10.5)
Chloride: 103 mEq/L (ref 96–112)
Creatinine, Ser: 0.67 mg/dL (ref 0.40–1.20)
GFR: 84.13 mL/min (ref >60.00)
Glucose, Bld: 103 mg/dL — ABNORMAL HIGH (ref 70–99)
Potassium: 4.1 mEq/L (ref 3.5–5.1)
Sodium: 140 mEq/L (ref 135–145)

## 2022-01-31 LAB — B12 AND FOLATE PANEL
Folate: >24.2 ng/mL (ref >5.9)
Vitamin B-12: >1504 pg/mL — ABNORMAL HIGH (ref 211–911)

## 2022-01-31 LAB — CK: Total CK: 51 U/L (ref 7–177)

## 2022-01-31 LAB — VITAMIN D 25 HYDROXY (VIT D DEFICIENCY, FRACTURES): VITD: 40.06 ng/mL (ref 30.00–100.00)

## 2022-01-31 MED ORDER — PANTOPRAZOLE SODIUM 40 MG PO TBEC: 40.0000 mg | DELAYED_RELEASE_TABLET | Freq: Every day | ORAL | 3 refills | Status: DC

## 2022-01-31 MED ORDER — NYSTATIN 100000 UNIT/ML MT SUSP: 5.0000 mL | Freq: Four times a day (QID) | OROMUCOSAL | 0 refills | Status: AC

## 2022-01-31 NOTE — Assessment & Plan Note (Signed)
With bil le weakness ?Referral to vascular surgeon ?

## 2022-01-31 NOTE — Telephone Encounter (Signed)
Patient would like a call at [phone], states she is not always on my chart ? ?Patient was thinking the Carotid Ultrasound and vein vascular appointments would be separate and not at the same place.  Please confirm with the patient  ?

## 2022-01-31 NOTE — Patient Instructions (Addendum)
Stop omeprazole, start pantoprazole for heart burn.  ?take this medication for two weeks, administer 30 minutes prior to breakfast each am. Try to decrease and or avoid spicy foods, fried fatty foods, and also caffeine and chocolate as these can increase heartburn symptoms.  ? ?A referral was placed today vascular surgery. ?Please let us know if you have not heard back within 1 week about your referral. ? ?A diagnostic test was ordered for today, for carotid US ?I have sent the order over to the facility. You should hear something to schedule in the next one week or so.  ? ?Stop by the lab prior to leaving today. I will notify you of your results once received.  ? ?It was a pleasure seeing you today! Please do not hesitate to reach out with any questions and or concerns. ? ?Regards,  ? ?Nevaeh Korte ?FNP-C ? ? ? ? ?

## 2022-01-31 NOTE — Assessment & Plan Note (Signed)
Stop omeprazole  ?Sent rx for pantoprazole 40 mg once daily.  ?Try to decrease and or avoid spicy foods, fried fatty foods, and also caffeine and chocolate as these can increase heartburn symptoms.  ? ?

## 2022-01-31 NOTE — Assessment & Plan Note (Signed)
So far resolved, bland diet as tolerated.  ?cmp pending ?

## 2022-01-31 NOTE — Assessment & Plan Note (Signed)
Order b12/folate/cbc pending results  ?Treating thrush as well, may contribute ?

## 2022-01-31 NOTE — Assessment & Plan Note (Signed)
Vitamin d ordered pending results ? ?

## 2022-01-31 NOTE — Assessment & Plan Note (Signed)
Ck ordered pending results ?Referral to vascular in place ?

## 2022-01-31 NOTE — Assessment & Plan Note (Signed)
Nystatin 100000 units sent to pharmacy  ? ?

## 2022-01-31 NOTE — Progress Notes (Signed)
? ?Established Patient Office Visit ? ?Subjective:  ?Patient ID: [name], female    DOB: 11/04/1945  Age: 76 y.o. MRN: [id number] ? ?CC:  ?Chief Complaint  ?Patient presents with  ? Extremity Weakness  ?  X week has noticed it more. When she gets up she notices it more than moving around.  ? Metal taste in mouth  ?  X 1 week.   ? ? ?HPI ?[name] is here today with concerns.  ? ?Noticed some weakness in her legs that come and goes, she states this has been ongoing since January of 2023. Does notice in the am with some weakness, does have 'bad veins in her legs' ? ?Has noticed that she has a smell that is not there, on and off for the last few months or so. In the last week she does notice a metal taste in her mouth, when she brushes her teeth it's not so bad but then she'll notice it again and will notice a burning sensation in her mouth. Feels like sucking on a piece of metal.  ? ?Does also notice some muscle tightness in the left side of her neck that she will also feel along the lower chin. She will feel at night a burning sensation that will go down her throat. She does have history of reflux and is curious if this may be the case. Taking omeprazole 40 mg once daily.  ? ?Last night she started having some stomach aggravation, no diarrhea but she did vomit twice last night and not since. She did have a colonoscopy three years ago, due again this fall. Denies constipation and or diarrhea. Does not feel nauseous at current, stomach is completely better. Had chicken casserole last night that she eats regularly.  ? ?Does have h/o lung cancer and thyroid cancer.  ?Does go to f/u annually with her oncologist as well.  ? ?Past Medical History:  ?Diagnosis Date  ? 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 GERD (gastroesophageal reflux disease)   ? GIB (gastrointestinal bleeding)   ? Groin pain   ? Along superior/laterl R inguinal canal.  Could be due to hernia or old scar tissue.  prev with CT done w/o  alarming findings.  Will treat episodically.  Use tylenol #3 prn.    ? Hyperlipidemia   ? Lipoma of thigh   ? Left  ? Normal cardiac stress test 2014  ? PONV (postoperative nausea and vomiting)   ? nausea after hysterectomy, and 2 days after lung surgery 10/2015  ? Thyroid cancer ([place]) 12/2015  ? Thyroid nodule   ? ? ?Past Surgical History:  ?Procedure Laterality Date  ? ABDOMINAL HYSTERECTOMY    ? BREAST EXCISIONAL BIOPSY Right 1977  ? BREAST MASS EXCISION  1977  ? benign  ? CARPAL TUNNEL RELEASE Right 1978  ? CATARACT EXTRACTION, BILATERAL Bilateral   ? CHOLECYSTECTOMY  11/23/2016  ? Procedure: LAPAROSCOPIC CHOLECYSTECTOMY;  Surgeon: Clayburn Pert, MD;  Location: [facility] ORS;  Service: General;;  ? COLONOSCOPY  2009  ? COLONOSCOPY WITH PROPOFOL N/A 05/18/2019  ? Procedure: COLONOSCOPY WITH PROPOFOL;  Surgeon: Virgel Manifold, MD;  Location: [facility] END[facility];  Service: Endoscopy;  Laterality: N/A;  ? COLONOSCOPY WITH PROPOFOL N/A 08/15/2019  ? Procedure: COLONOSCOPY WITH PROPOFOL;  Surgeon: Virgel Manifold, MD;  Location: [facility] END[facility];  Service: Endoscopy;  Laterality: N/A;  ? [place] OF UTERUS  2001  ? ELECTROMAGNETIC NAVIGATION BROCHOSCOPY N/A 08/25/2015  ? Procedure: ELECTROMAGNETIC NAVIGATION BRONCHOSCOPY;  Surgeon: Flora Lipps, MD;  Location: [facility] ORS;  Service: Cardiopulmonary;  Laterality: N/A;  ? ENDOBRONCHIAL ULTRASOUND N/A 08/25/2015  ? Procedure: ENDOBRONCHIAL ULTRASOUND;  Surgeon: Flora Lipps, MD;  Location: [facility] ORS;  Service: Cardiopulmonary;  Laterality: N/A;  ? ESOPHAGOGASTRODUODENOSCOPY  16-Jan-2001, 11/15/07  ? Normal (Dr. Allyn Kenner)  ? ESOPHAGOGASTRODUODENOSCOPY (EGD) WITH PROPOFOL N/A 05/18/2019  ? Procedure: ESOPHAGOGASTRODUODENOSCOPY (EGD) WITH PROPOFOL;  Surgeon: Virgel Manifold, MD;  Location: [facility];  Service: Endoscopy;  Laterality: N/A;  ? ESOPHAGOGASTRODUODENOSCOPY (EGD) WITH PROPOFOL N/A 08/15/2019  ? Procedure: ESOPHAGOGASTRODUODENOSCOPY (EGD) WITH PROPOFOL;   Surgeon: Virgel Manifold, MD;  Location: [facility];  Service: Endoscopy;  Laterality: N/A;  ? FACIAL COSMETIC SURGERY  01/13/10  ? mini facelift  ? HERNIA REPAIR  1976  ? OOPHORECTOMY    ? THORACOTOMY/LOBECTOMY Right 10/27/2015  ? Procedure: THORACOTOMY/LOBECTOMY;  Surgeon: Nestor Lewandowsky, MD;  Location: [facility];  Service: Thoracic;  Laterality: Right;  ? THYROIDECTOMY N/A 12/29/2015  ? Procedure: THYROIDECTOMY;  Surgeon: Beverly Gust, MD;  Location: [facility] OR[facility];  Service: ENT;  Laterality: N/A;  ? TOTAL VAGINAL HYSTERECTOMY  12/30/08  ? for fibroid pain (Dr. Gertie Fey)  ? ? ?Family History  ?Problem Relation Age of Onset  ? Transient ischemic attack Mother   ? Hypertension Mother   ? Stroke Mother   ?     mini strokes  ? Hypertension Sister   ? Cancer Sister   ?     ovarian  ? Hypertension Sister   ? Hypertension Sister   ? Hypertension Sister   ? Breast cancer Neg Hx   ? Colon cancer Neg Hx   ? ? ?Social History  ? ?Socioeconomic History  ? Marital status: Married  ?  Spouse name: Not on file  ? Number of children: Not on file  ? Years of education: Not on file  ? Highest education level: Not on file  ?Occupational History  ? 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 Smoking status: Never  ? Smokeless tobacco: Never  ?Vaping Use  ? Vaping Use: Never used  ?Substance and Sexual Activity  ? Alcohol use: No  ?  Alcohol/week: 0.0 standard drinks  ? Drug use: No  ? Sexual activity: Never  ?Other Topics Concern  ? Not on file  ?Social History Narrative  ? Married 1962/01/16 and lives with husband (he had a CVA 11/12, to SNF and then home as of 2021/01/16).  He has short term memory loss.    ? 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  ? Previously had an adopted son who died in 17-Jan-2020.  ? ?Social Determinants of Health  ? ?Financial Resource Strain: Low Risk   ? Difficulty of Paying Living Expenses: Not hard at all  ?Food Insecurity: No Food Insecurity  ? Worried About Charity fundraiser in the Last Year: Never true  ? Ran Out  of Food in the Last Year: Never true  ?Transportation Needs: Not on file  ?Physical Activity: Not on file  ?Stress: Not on file  ?Social Connections: Not on file  ?Intimate Partner Violence: Not on file  ? ? ?Outpatient Medications Prior to Visit  ?Medication Sig Dispense Refill  ? acetaminophen (TYLENOL) 500 MG tablet Take 1,000 mg by mouth every 6 (six) hours as needed for mild pain, moderate pain or headache.    ? Ascorbic Acid (VITAMIN C) 500 MG tablet Take 2,000 mg by mouth daily. Reported on 02/04/2016    ? aspirin EC 81 MG tablet  Take 1 tablet (81 mg total) by mouth daily.    ? Biotin 1000 MCG tablet Take 1,000 mcg by mouth daily. Reported on 02/04/2016    ? CALCIUM-VITAMIN D PO Take by mouth. 1 tab MWF    ? Coenzyme Q10 200 MG capsule Take 200 mg by mouth daily.    ? Folic Acid-Vit O3-ANV B16 (FA-VITAMIN B-6-VITAMIN B-12 PO) Take 1 tablet by mouth daily after breakfast. Reported on 02/04/2016    ? levothyroxine (SYNTHROID) 88 MCG tablet TAKE 1 TABLET BY MOUTH DAILY BEFORE BREAKFAST. 90 tablet 3  ? Lutein 20 MG CAPS Take 20 mg by mouth daily.    ? methocarbamol (ROBAXIN) 500 MG tablet Take 1 tablet (500 mg total) by mouth every 8 (eight) hours as needed for muscle spasms. 30 tablet 2  ? Omega-3 Fatty Acids (SUPER OMEGA 3 EPA/DHA) 1000 MG CAPS Take 1,000 mg by mouth daily with supper.    ? Potassium Gluconate 595 MG CAPS Take 595 mg by mouth daily.     ? pravastatin (PRAVACHOL) 40 MG tablet TAKE 1 TABLET BY MOUTH EVERY DAY 90 tablet 3  ? omeprazole (PRILOSEC) 40 MG capsule TAKE 1 CAPSULE BY MOUTH EVERY DAY 90 capsule 1  ? fluticasone (FLONASE) 50 MCG/ACT nasal spray Place 2 sprays into both nostrils daily. (Patient not taking: Reported on 01/31/2022) 16 mL 0  ? ?No facility-administered medications prior to visit.  ? ? ?Allergies  ?Allergen Reactions  ? Atorvastatin   ?  REACTION: myalgias  ? Ciprofloxacin   ?  REACTION: increase reflux  ? Epinephrine   ?  Heart racing with dental injection  ? Metoprolol Succinate  Swelling  ?  Swelling and stiffness in hands and ankles  ? Simvastatin Other (See Comments)  ?  Muscle aches and stiffness  ? Sulfa Antibiotics Other (See Comments)  ?  REACTION: itching  ? ? ?ROS ?Review

## 2022-01-31 NOTE — Assessment & Plan Note (Addendum)
US carotid pending for left side ?If any cp palp sob go to er and or call 911 ?Multiple concerns in office going over acute and chronic concerns totaled 42 minutes ?

## 2022-02-01 ENCOUNTER — Other Ambulatory Visit: Payer: Self-pay | Admitting: Family

## 2022-02-01 DIAGNOSIS — I700 Atherosclerosis of aorta: Secondary | ICD-10-CM

## 2022-02-01 DIAGNOSIS — R29898 Other symptoms and signs involving the musculoskeletal system: Secondary | ICD-10-CM

## 2022-02-01 NOTE — Telephone Encounter (Signed)
Spoke with patient. She was given numbers to [facility] in [place] and [facility] scheduling to schedule her appts.  ?Tabitha corrected her Carotid US order - it was placed for the wrong location and the order was incorrect.  ? ?Nothing further needed.  ? ?

## 2022-02-09 ENCOUNTER — Ambulatory Visit
Admission: RE | Admit: 2022-02-09 | Discharge: 2022-02-09 | Disposition: A | Discharge status: Home / Self Care | Payer: PPO | Source: Ambulatory Visit | Attending: Family | Admitting: Family

## 2022-02-09 DIAGNOSIS — R29898 Other symptoms and signs involving the musculoskeletal system: Secondary | ICD-10-CM | POA: Diagnosis not present

## 2022-02-09 DIAGNOSIS — I700 Atherosclerosis of aorta: Secondary | ICD-10-CM | POA: Insufficient documentation

## 2022-02-09 DIAGNOSIS — I6523 Occlusion and stenosis of bilateral carotid arteries: Secondary | ICD-10-CM | POA: Diagnosis not present

## 2022-02-14 ENCOUNTER — Ambulatory Visit (INDEPENDENT_AMBULATORY_CARE_PROVIDER_SITE_OTHER): Payer: PPO | Admitting: Family Medicine

## 2022-02-14 ENCOUNTER — Encounter: Payer: Self-pay | Admitting: Family Medicine

## 2022-02-14 VITALS — BP 126/70 | HR 94 | Temp 97.6°F | Ht 65.0 in | Wt 127.0 lb

## 2022-02-14 DIAGNOSIS — R432 Parageusia: Secondary | ICD-10-CM | POA: Diagnosis not present

## 2022-02-14 DIAGNOSIS — R439 Unspecified disturbances of smell and taste: Secondary | ICD-10-CM

## 2022-02-14 NOTE — Patient Instructions (Signed)
Don't change your meds for now but we'll call about seeing ENT.  ?Take care.  Glad to see you. ?

## 2022-02-14 NOTE — Progress Notes (Signed)
She has vein clinic f/u pending.  History of varicose veins noted on the legs. ? ?Recent labs d/w pt.   ? ?H/o episodic smell change and metal taste in mouth.  She has some dry mouth at night.  Took nystatin by mouth in the meantime, she is some better in the meantime.  Biotene helped some.  She still has an occ change in smell, meaning she can smell it in her nose but it is not present in the environment.  Metallic taste change is some better.   ? ?Elevated B12 noted in the meantime.  She changed PPI in the meantime.   ? ?Neck u/s recently done.  L neck tightness comes and goes, no recent change.   ? ?She had unremarkable dental eval in the meantime.  ? ?She had ptosis surgery in 12/08/21.  No vision loss or double vision.  She had f/u eye exam in the meantime.   ? ?max BP at home: 136/76 ? ?No bloody stools.  She noted stool caliber changes with GI f/u pending.  Discussed with patient, I will await GI note. ? ?Meds, vitals, and allergies reviewed.  ? ?ROS: Per HPI unless specifically indicated in ROS section  ? ?GEN: nad, alert and oriented ?HEENT:ncat, OP wnl, MMM ?NECK: supple w/o LA ?CV: rrr.   ?PULM: ctab, no inc wob ?ABD: soft, +bs ?EXT: no edema ?SKIN: Varicose veins noted on lower extremity. ?

## 2022-02-17 DIAGNOSIS — R439 Unspecified disturbances of smell and taste: Secondary | ICD-10-CM | POA: Insufficient documentation

## 2022-02-17 NOTE — Assessment & Plan Note (Signed)
She has had some episodic tightness in the left side of the neck but her neck is not stiff.  I suspect this to be an unrelated muscular issue. ? ?I doubt that the B12 elevation caused her symptoms.  Discussed options at this point.  She could have atypical GERD that it caused her symptoms.  She has changed PPI in the meantime and I think it makes sense to continue with pantoprazole for now.  I do not see an obvious cause for her symptoms otherwise.  I think it makes sense to follow-up with ENT to see if she has evidence of GERD seen on exam.  No stridor and still okay for outpatient follow-up.  She agrees with plan. ? ?As a separate issue she is going to follow-up with GI.  See above. ?

## 2022-02-24 DIAGNOSIS — H04123 Dry eye syndrome of bilateral lacrimal glands: Secondary | ICD-10-CM | POA: Diagnosis not present

## 2022-02-25 ENCOUNTER — Encounter: Payer: Self-pay | Admitting: *Deleted

## 2022-02-28 ENCOUNTER — Encounter (INDEPENDENT_AMBULATORY_CARE_PROVIDER_SITE_OTHER): Payer: Self-pay | Admitting: Vascular Surgery

## 2022-02-28 ENCOUNTER — Ambulatory Visit (INDEPENDENT_AMBULATORY_CARE_PROVIDER_SITE_OTHER): Payer: PPO | Admitting: Vascular Surgery

## 2022-02-28 VITALS — BP 159/84 | HR 85 | Resp 16 | Ht 63.75 in | Wt 128.8 lb

## 2022-02-28 DIAGNOSIS — I83893 Varicose veins of bilateral lower extremities with other complications: Secondary | ICD-10-CM | POA: Diagnosis not present

## 2022-02-28 DIAGNOSIS — K219 Gastro-esophageal reflux disease without esophagitis: Secondary | ICD-10-CM | POA: Diagnosis not present

## 2022-02-28 DIAGNOSIS — E7800 Pure hypercholesterolemia, unspecified: Secondary | ICD-10-CM

## 2022-02-28 DIAGNOSIS — I10 Essential (primary) hypertension: Secondary | ICD-10-CM | POA: Diagnosis not present

## 2022-02-28 NOTE — Progress Notes (Signed)
? ? ? ? ?MRN : [id number] ? ?[name] is a 76 y.o. (1946-05-21) female who presents with chief complaint of varicose veins hurt. ? ?History of Present Illness:  ? ?The patient is seen for evaluation of symptomatic varicose veins. The patient relates burning and stinging which worsened steadily throughout the course of the day, particularly with standing. The patient also notes an aching and throbbing pain over the varicosities, particularly with prolonged dependent positions. The symptoms are significantly improved with elevation.  The patient also notes that during hot weather the symptoms are greatly intensified. The patient states the pain from the varicose veins interferes with work, daily exercise, shopping and household maintenance. At this point, the symptoms are persistent and severe enough that they're having a negative impact on lifestyle and are interfering with daily activities. ? ?There is no history of DVT, PE or superficial thrombophlebitis. ?There is no history of ulceration or hemorrhage. ?The patient denies a significant family history of varicose veins. ? ?The patient has not worn graduated compression in the past. At the present time the patient has not been using over-the-counter analgesics. There is no history of prior surgical intervention or sclerotherapy. ?  ? ?No outpatient medications have been marked as taking for the 02/28/22 encounter (Appointment) with Delana Meyer, Dolores Lory, MD.  ? ? ?Past Medical History:  ?Diagnosis Date  ? 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 GERD (gastroesophageal reflux disease)   ? GIB (gastrointestinal bleeding)   ? Groin pain   ? Along superior/laterl R inguinal canal.  Could be due to hernia or old scar tissue.  prev with CT done w/o alarming findings.  Will treat episodically.  Use tylenol #3 prn.    ? Hyperlipidemia   ? Lipoma of thigh   ? Left  ? Normal cardiac stress test 2014  ? PONV (postoperative nausea and vomiting)   ? nausea after  hysterectomy, and 2 days after lung surgery 10/2015  ? Thyroid cancer ([place]) 12/2015  ? Thyroid nodule   ? ? ?Past Surgical History:  ?Procedure Laterality Date  ? ABDOMINAL HYSTERECTOMY    ? BREAST EXCISIONAL BIOPSY Right 1977  ? BREAST MASS EXCISION  1977  ? benign  ? CARPAL TUNNEL RELEASE Right 1978  ? CATARACT EXTRACTION, BILATERAL Bilateral   ? CHOLECYSTECTOMY  11/23/2016  ? Procedure: LAPAROSCOPIC CHOLECYSTECTOMY;  Surgeon: Clayburn Pert, MD;  Location: [facility];  Service: General;;  ? COLONOSCOPY  2009  ? COLONOSCOPY WITH PROPOFOL N/A 05/18/2019  ? Procedure: COLONOSCOPY WITH PROPOFOL;  Surgeon: Virgel Manifold, MD;  Location: [facility];  Service: Endoscopy;  Laterality: N/A;  ? COLONOSCOPY WITH PROPOFOL N/A 08/15/2019  ? Procedure: COLONOSCOPY WITH PROPOFOL;  Surgeon: Virgel Manifold, MD;  Location: [facility];  Service: Endoscopy;  Laterality: N/A;  ? [place] OF UTERUS  2001  ? ELECTROMAGNETIC NAVIGATION BROCHOSCOPY N/A 08/25/2015  ? Procedure: ELECTROMAGNETIC NAVIGATION BRONCHOSCOPY;  Surgeon: Flora Lipps, MD;  Location: [facility];  Service: Cardiopulmonary;  Laterality: N/A;  ? ENDOBRONCHIAL ULTRASOUND N/A 08/25/2015  ? Procedure: ENDOBRONCHIAL ULTRASOUND;  Surgeon: Flora Lipps, MD;  Location: [facility];  Service: Cardiopulmonary;  Laterality: N/A;  ? ESOPHAGOGASTRODUODENOSCOPY  2002, 11/15/07  ? Normal (Dr. Allyn Kenner)  ? ESOPHAGOGASTRODUODENOSCOPY (EGD) WITH PROPOFOL N/A 05/18/2019  ? Procedure: ESOPHAGOGASTRODUODENOSCOPY (EGD) WITH PROPOFOL;  Surgeon: Virgel Manifold, MD;  Location: [facility];  Service: Endoscopy;  Laterality: N/A;  ? ESOPHAGOGASTRODUODENOSCOPY (EGD) WITH PROPOFOL N/A 08/15/2019  ? Procedure: ESOPHAGOGASTRODUODENOSCOPY (EGD) WITH PROPOFOL;  Surgeon:  Virgel Manifold, MD;  Location: [facility];  Service: Endoscopy;  Laterality: N/A;  ? FACIAL COSMETIC SURGERY  01/13/10  ? mini facelift  ? HERNIA REPAIR  1976  ? OOPHORECTOMY    ?  THORACOTOMY/LOBECTOMY Right 10/27/2015  ? Procedure: THORACOTOMY/LOBECTOMY;  Surgeon: Nestor Lewandowsky, MD;  Location: [facility];  Service: Thoracic;  Laterality: Right;  ? THYROIDECTOMY N/A 12/29/2015  ? Procedure: THYROIDECTOMY;  Surgeon: Beverly Gust, MD;  Location: [facility];  Service: ENT;  Laterality: N/A;  ? TOTAL VAGINAL HYSTERECTOMY  12/30/08  ? for fibroid pain (Dr. Gertie Fey)  ? ? ?Social History ?Social History  ? ?Tobacco Use  ? Smoking status: Never  ? Smokeless tobacco: Never  ?Vaping Use  ? Vaping Use: Never used  ?Substance Use Topics  ? Alcohol use: No  ?  Alcohol/week: 0.0 standard drinks  ? Drug use: No  ? ? ?Family History ?Family History  ?Problem Relation Age of Onset  ? Transient ischemic attack Mother   ? Hypertension Mother   ? Stroke Mother   ?     mini strokes  ? Hypertension Sister   ? Cancer Sister   ?     ovarian  ? Hypertension Sister   ? Hypertension Sister   ? Hypertension Sister   ? Breast cancer Neg Hx   ? Colon cancer Neg Hx   ? ? ?Allergies  ?Allergen Reactions  ? Atorvastatin   ?  REACTION: myalgias  ? Ciprofloxacin   ?  REACTION: increase reflux  ? Epinephrine   ?  Heart racing with dental injection  ? Metoprolol Succinate Swelling  ?  Swelling and stiffness in hands and ankles  ? Simvastatin Other (See Comments)  ?  Muscle aches and stiffness  ? Sulfa Antibiotics Other (See Comments)  ?  REACTION: itching  ? ? ? ?REVIEW OF SYSTEMS (Negative unless checked) ? ?Constitutional: [] Weight loss  [] Fever  [] Chills ?Cardiac: [] Chest pain   [] Chest pressure   [] Palpitations   [] Shortness of breath when laying flat   [] Shortness of breath with exertion. ?Vascular:  [] Pain in legs with walking   [x] Pain in legs with standing  [] History of DVT   [] Phlebitis   [] Swelling in legs   [x] Varicose veins   [] Non-healing ulcers ?Pulmonary:   [] Uses home oxygen   [] Productive cough   [] Hemoptysis   [] Wheeze  [] COPD   [] Asthma ?Neurologic:  [] Dizziness   [] Seizures   [] History of stroke   [] History of TIA   [] Aphasia   [] Vissual changes   [] Weakness or numbness in arm   [x] Weakness or numbness in leg ?Musculoskeletal:   [] Joint swelling   [] Joint pain   [] Low back pain ?Hematologic:  [] Easy bruising  [] Easy bleeding   [] Hypercoagulable state   [] Anemic ?Gastrointestinal:  [] Diarrhea   [] Vomiting  [x] Gastroesophageal reflux/heartburn   [] Difficulty swallowing. ?Genitourinary:  [] Chronic kidney disease   [] Difficult urination  [] Frequent urination   [] Blood in urine ?Skin:  [] Rashes   [] Ulcers  ?Psychological:  [] History of anxiety   []  History of major depression. ? ?Physical Examination ? ?There were no vitals filed for this visit. ?There is no height or weight on file to calculate BMI. ?Gen: WD/WN, NAD ?Head: [place]/AT, No temporalis wasting.  ?Ear/Nose/Throat: Hearing grossly intact, nares w/o erythema or drainage, pinna without lesions ?Eyes: PER, EOMI, sclera nonicteric.  ?Neck: Supple, no gross masses.  No JVD.  ?Pulmonary:  Good air movement, no audible wheezing, no use of accessory muscles.  ?Cardiac: RRR, precordium  not hyperdynamic. ?Vascular:  Large varicosities present, greater than 10 mm bilaterally.  Veins are tender to palpation  Moderate venous stasis changes to the legs bilaterally.  Trace soft pitting edema  ?Vessel Right Left  ?Radial Palpable Palpable  ?Gastrointestinal: soft, non-distended. No guarding/no peritoneal signs.  ?Musculoskeletal: M/S 5/5 throughout.  No deformity.  ?Neurologic: CN 2-12 intact. Pain and light touch intact in extremities.  Symmetrical.  Speech is fluent. Motor exam as listed above. ?Psychiatric: Judgment intact, Mood & affect appropriate for pt's clinical situation. ?Dermatologic: Venous rashes no ulcers noted.  No changes consistent with cellulitis. ?Lymph : No lichenification or skin changes of chronic lymphedema. ? ?CBC ?Lab Results  ?Component Value Date  ? WBC 7.5 01/31/2022  ? HGB 12.9 01/31/2022  ? HCT 39.0 01/31/2022  ? MCV 93.6 01/31/2022  ? PLT 153.0 01/31/2022   ? ? ?BMET ?   ?Component Value Date/Time  ? NA 140 01/31/2022 1028  ? NA 141 07/01/2013 0103  ? K 4.1 01/31/2022 1028  ? K 3.8 07/01/2013 0103  ? CL 103 01/31/2022 1028  ? CL 110 (H) 07/01/2013 0103  ? CO2 28 01/31/2022 1028

## 2022-03-02 ENCOUNTER — Encounter (INDEPENDENT_AMBULATORY_CARE_PROVIDER_SITE_OTHER): Payer: Self-pay | Admitting: Vascular Surgery

## 2022-03-02 ENCOUNTER — Ambulatory Visit
Admission: RE | Admit: 2022-03-02 | Discharge: 2022-03-02 | Disposition: A | Discharge status: Home / Self Care | Payer: PPO | Source: Ambulatory Visit | Attending: Family Medicine | Admitting: Family Medicine

## 2022-03-02 DIAGNOSIS — Z1231 Encounter for screening mammogram for malignant neoplasm of breast: Secondary | ICD-10-CM | POA: Diagnosis not present

## 2022-03-03 ENCOUNTER — Telehealth (INDEPENDENT_AMBULATORY_CARE_PROVIDER_SITE_OTHER): Payer: Self-pay

## 2022-03-03 DIAGNOSIS — R430 Anosmia: Secondary | ICD-10-CM | POA: Diagnosis not present

## 2022-03-03 DIAGNOSIS — R682 Dry mouth, unspecified: Secondary | ICD-10-CM | POA: Diagnosis not present

## 2022-03-03 NOTE — Telephone Encounter (Signed)
Patient called in with concerns about here AVS from visit on 02/28/2022, patient states that the history and information listed is not what her and provider spoke about. She is stating that she wants this down and out of her chart because its untrue and she never said any of these things. I let patient know what she was referred to [place] for and what I seen in the chart did look correct but I would speak with my manger and also the Dr for them to see what went wrong and what we could do to fix this issue  ? ?Patient is expecting a phone call from either the Dr or management ? ? ? ?Please advise   ? ? ?

## 2022-03-04 NOTE — Telephone Encounter (Signed)
Patient called in with concerns about here AVS from visit on 02/28/2022, patient states that the history and information listed is not what her and provider spoke about. She is stating that she wants this down and out of her chart because its untrue and she never said any of these things. I let patient know what she was referred to [place] for and what I seen in the chart did look correct but I would speak with my manger and also the Dr for them to see what went wrong and what we could do to fix this issue  ?  ?Patient is expecting a phone call from either the Dr or management ?  ?  ?  ?Please advise  ?

## 2022-03-04 NOTE — Telephone Encounter (Signed)
Spoke with patient and she is aware that the doctor will need time to make the changes she wants to see. Advised her once they are made she will get a mychart message. ?

## 2022-03-07 ENCOUNTER — Encounter: Payer: Self-pay | Admitting: Family Medicine

## 2022-03-07 ENCOUNTER — Telehealth: Payer: Self-pay

## 2022-03-07 ENCOUNTER — Ambulatory Visit (INDEPENDENT_AMBULATORY_CARE_PROVIDER_SITE_OTHER): Payer: PPO | Admitting: Family Medicine

## 2022-03-07 VITALS — BP 124/80 | HR 98 | Temp 97.9°F | Ht 63.75 in | Wt 128.0 lb

## 2022-03-07 DIAGNOSIS — R300 Dysuria: Secondary | ICD-10-CM

## 2022-03-07 LAB — POC URINALSYSI DIPSTICK (AUTOMATED)
Blood, UA: NEGATIVE
Glucose, UA: POSITIVE — AB
Ketones, UA: NEGATIVE
Nitrite, UA: POSITIVE
Protein, UA: NEGATIVE
Spec Grav, UA: 1.01 (ref 1.010–1.025)
Urobilinogen, UA: 2 E.U./dL — AB
pH, UA: 6 (ref 5.0–8.0)

## 2022-03-07 MED ORDER — NITROFURANTOIN MONOHYD MACRO 100 MG PO CAPS: 100.0000 mg | ORAL_CAPSULE | Freq: Two times a day (BID) | ORAL | 0 refills | Status: DC

## 2022-03-07 NOTE — Telephone Encounter (Signed)
Medications have been updated in chart. ?

## 2022-03-07 NOTE — Patient Instructions (Signed)
Drink plenty of water and start the antibiotics today.  We'll contact you with your lab report.  Take care.   

## 2022-03-07 NOTE — Progress Notes (Signed)
dysuria: yes ?duration of symptoms: a few days.   ?abdominal pain:no ?fevers:no ?back pain:no ?vomiting:no ?Patient has had urgency, frequency and burning/stinging x 3 days. She has taken AZO x 2 days, it helped some.  D/w pt about u/a and ucx pending.   ? ?Brother in law recently died of dementia, condolences offered.   ? ?She had concerns about items in her note from vascular clinic and she has already contacted that clinic about correcting/updating her note.  She can update me as needed.  D/w pt.   ? ?She saw ENT about smell and taste changes.  MRI pending per ENT. She is off fish oil per ENT advice.   ? ?Meds, vitals, and allergies reviewed.  ? ?Per HPI unless specifically indicated in ROS section  ? ?GEN: nad, alert and oriented ?HEENT: ncat ?NECK: supple ?CV: rrr.  ?PULM: ctab, no inc wob ?ABD: soft, +bs, suprapubic area not tender ?EXT: no edema ?SKIN: well perfused.   ?BACK: no CVA pain ?

## 2022-03-08 LAB — URINE CULTURE
MICRO NUMBER:: 13364849
Result:: NO GROWTH
SPECIMEN QUALITY:: ADEQUATE

## 2022-03-09 ENCOUNTER — Other Ambulatory Visit: Payer: Self-pay | Admitting: Unknown Physician Specialty

## 2022-03-09 DIAGNOSIS — R430 Anosmia: Secondary | ICD-10-CM

## 2022-03-09 DIAGNOSIS — R300 Dysuria: Secondary | ICD-10-CM | POA: Insufficient documentation

## 2022-03-09 NOTE — Assessment & Plan Note (Signed)
Presumed cystitis.  D/w pt. Macrobid, PO fluids, ucx pending.  U/a d/w pt.  ?

## 2022-03-18 ENCOUNTER — Ambulatory Visit
Admission: RE | Admit: 2022-03-18 | Discharge: 2022-03-18 | Disposition: A | Discharge status: Home / Self Care | Payer: PPO | Source: Ambulatory Visit | Attending: Unknown Physician Specialty | Admitting: Unknown Physician Specialty

## 2022-03-18 DIAGNOSIS — R430 Anosmia: Secondary | ICD-10-CM | POA: Diagnosis not present

## 2022-03-18 DIAGNOSIS — I619 Nontraumatic intracerebral hemorrhage, unspecified: Secondary | ICD-10-CM | POA: Diagnosis not present

## 2022-03-18 MED ORDER — GADOBENATE DIMEGLUMINE 529 MG/ML IV SOLN
10.0000 mL | Freq: Once | INTRAVENOUS | Status: AC | PRN
  Administered 2022-03-18: 10 mL via INTRAVENOUS

## 2022-03-26 ENCOUNTER — Other Ambulatory Visit: Payer: Self-pay | Admitting: Family Medicine

## 2022-03-30 DIAGNOSIS — R442 Other hallucinations: Secondary | ICD-10-CM | POA: Diagnosis not present

## 2022-03-30 DIAGNOSIS — R519 Headache, unspecified: Secondary | ICD-10-CM | POA: Diagnosis not present

## 2022-03-30 DIAGNOSIS — R439 Unspecified disturbances of smell and taste: Secondary | ICD-10-CM | POA: Diagnosis not present

## 2022-04-07 ENCOUNTER — Other Ambulatory Visit (INDEPENDENT_AMBULATORY_CARE_PROVIDER_SITE_OTHER): Payer: Self-pay | Admitting: Vascular Surgery

## 2022-04-07 DIAGNOSIS — I83893 Varicose veins of bilateral lower extremities with other complications: Secondary | ICD-10-CM

## 2022-04-11 ENCOUNTER — Emergency Department: Payer: PPO

## 2022-04-11 ENCOUNTER — Other Ambulatory Visit: Payer: Self-pay

## 2022-04-11 ENCOUNTER — Emergency Department
Admission: EM | Admit: 2022-04-11 | Discharge: 2022-04-11 | Disposition: A | Discharge status: Home / Self Care | Payer: PPO | Attending: Emergency Medicine | Admitting: Emergency Medicine

## 2022-04-11 DIAGNOSIS — R0789 Other chest pain: Secondary | ICD-10-CM | POA: Diagnosis not present

## 2022-04-11 DIAGNOSIS — Z85850 Personal history of malignant neoplasm of thyroid: Secondary | ICD-10-CM | POA: Insufficient documentation

## 2022-04-11 DIAGNOSIS — R918 Other nonspecific abnormal finding of lung field: Secondary | ICD-10-CM | POA: Diagnosis not present

## 2022-04-11 DIAGNOSIS — Z85118 Personal history of other malignant neoplasm of bronchus and lung: Secondary | ICD-10-CM | POA: Insufficient documentation

## 2022-04-11 DIAGNOSIS — Z86018 Personal history of other benign neoplasm: Secondary | ICD-10-CM | POA: Insufficient documentation

## 2022-04-11 DIAGNOSIS — R6884 Jaw pain: Secondary | ICD-10-CM | POA: Diagnosis not present

## 2022-04-11 DIAGNOSIS — I10 Essential (primary) hypertension: Secondary | ICD-10-CM | POA: Diagnosis not present

## 2022-04-11 DIAGNOSIS — R9389 Abnormal findings on diagnostic imaging of other specified body structures: Secondary | ICD-10-CM

## 2022-04-11 LAB — TROPONIN I (HIGH SENSITIVITY): Troponin I (High Sensitivity): 5 ng/L (ref <18)

## 2022-04-11 LAB — BASIC METABOLIC PANEL
Anion gap: 5 (ref 5–15)
BUN: 18 mg/dL (ref 8–23)
CO2: 30 mmol/L (ref 22–32)
Calcium: 8.8 mg/dL — ABNORMAL LOW (ref 8.9–10.3)
Chloride: 104 mmol/L (ref 98–111)
Creatinine, Ser: 0.7 mg/dL (ref 0.44–1.00)
GFR, Estimated: >60 mL/min (ref >60)
Glucose, Bld: 104 mg/dL — ABNORMAL HIGH (ref 70–99)
Potassium: 3.9 mmol/L (ref 3.5–5.1)
Sodium: 139 mmol/L (ref 135–145)

## 2022-04-11 LAB — CBC WITH DIFFERENTIAL/PLATELET
Abs Immature Granulocytes: 0.02 10*3/uL (ref 0.00–0.07)
Basophils Absolute: 0 10*3/uL (ref 0.0–0.1)
Basophils Relative: 1 %
Eosinophils Absolute: 0 10*3/uL (ref 0.0–0.5)
Eosinophils Relative: 1 %
HCT: 40.6 % (ref 36.0–46.0)
Hemoglobin: 13 g/dL (ref 12.0–15.0)
Immature Granulocytes: 0 %
Lymphocytes Relative: 21 %
Lymphs Abs: 1 10*3/uL (ref 0.7–4.0)
MCH: 30.3 pg (ref 26.0–34.0)
MCHC: 32 g/dL (ref 30.0–36.0)
MCV: 94.6 fL (ref 80.0–100.0)
Monocytes Absolute: 0.4 10*3/uL (ref 0.1–1.0)
Monocytes Relative: 7 %
Neutro Abs: 3.5 10*3/uL (ref 1.7–7.7)
Neutrophils Relative %: 70 %
Platelets: 138 10*3/uL — ABNORMAL LOW (ref 150–400)
RBC: 4.29 MIL/uL (ref 3.87–5.11)
RDW: 13.8 % (ref 11.5–15.5)
WBC: 5 10*3/uL (ref 4.0–10.5)
nRBC: 0 % (ref 0.0–0.2)

## 2022-04-11 LAB — SEDIMENTATION RATE: Sed Rate: 6 mm/h (ref 0–30)

## 2022-04-11 NOTE — ED Triage Notes (Signed)
Pt here with bilateral neck pain and hypertension. Pt states she has felt some tightness and tingling on both sides of her neck that has worsened over time. Pt also states her bp at home was 176/82 which is not normal for her. Pt states intermittent chest pain.

## 2022-04-11 NOTE — Discharge Instructions (Addendum)
Your Chest XR  IMPRESSION: 1. Progressive densities at the lung apices bilaterally. While this may represent scarring, infection is not excluded. 2. Aortic atherosclerosis.

## 2022-04-11 NOTE — ED Provider Notes (Signed)
 [facility] Provider Note    Event Date/Time   First MD Initiated Contact with Patient 04/11/22 0947     (approximate)   History   Neck Pain and Hypertension   HPI  [name] is a 76 y.o. female with a past medical history of GI bleed, GERD, adenocarcinoma of the lung, HDL, thyroid nodule and carotid stenosis who presents for evaluation of several months of intermittent episodes of jaw tightness associated with some diaphoresis and tightness in her neck.  She states she is also noticed some bilateral lower chest tightness as well over the last couple weeks.  She feels these episodes are now getting better and occur up to every other day going back to at least May or April.  He has been seen by ENT doctor and a neurologist and her PCP but is waiting to get back to see her PCP.  Symptoms are not exertional or positional or related to meals.  She denies any cough, shortness of breath, back pain or any current symptoms.  She states that, go unpredictably.  No earache, sore throat, acute vision changes, focal extremity weakness numbness or tingling, Donnell pain, nausea vomiting diarrhea or urinary symptoms.  She is not currently treated for high blood pressure.  She is currently on nortriptyline for headaches.  She notes she had an MRI done on 5/19 showed some scattered nonspecific white matter lesions but her neurologist was not concerned about this and she has a pending outpatient EEG work-up.    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  Could be due to hernia or old scar tissue.  prev with CT done w/o alarming findings.  Will treat episodically.  Use tylenol #3 prn.     Hyperlipidemia    Lipoma of thigh    Left   Normal cardiac stress test 2014   PONV (postoperative nausea and  vomiting)    nausea after hysterectomy, and 2 days after lung surgery 10/2015   Thyroid cancer ([facility]) 12/2015   Thyroid nodule      Physical Exam  Triage Vital Signs: ED Triage Vitals  Enc Vitals Group     BP 04/11/22 0942 (!) 161/74     Pulse Rate 04/11/22 0942 85     Resp 04/11/22 0942 16     Temp 04/11/22 0942 98 F (36.7 C)     Temp Source 04/11/22 0942 Oral     SpO2 04/11/22 0942 100 %     Weight 04/11/22 0943 128 lb (58.1 kg)     Height 04/11/22 0943 5' 4" (1.626 m)     Head Circumference --      Peak Flow --      Pain Score 04/11/22 0943 5     Pain Loc --      Pain Edu? --      Excl. in GC? --     Most recent vital signs: Vitals:   04/11/22 0942 04/11/22 1138  BP: (!) 161/74 (!) 143/69  Pulse: 85 74  Resp: 16 18  Temp: 98 F (36.7 C) 98 F (36.7 C)  SpO2: 100% 99%    General: Awake, no distress.  CV:  Good peripheral perfusion.  2+ radial pulses Resp:  Normal effort.  There are bilaterally Abd:  No distention.  Soft throughout.   Other:  Cranial nerves II through XII grossly intact.  No pronator drift.  No finger dysmetria.  Symmetric 5/5 strength of all extremities.  Sensation intact to light touch in all extremities.  Unremarkable unassisted gait.  Visual acuity is 20/25 bilaterally    ED Results / Procedures / Treatments  Labs (all labs ordered are listed, but only abnormal results are displayed) Labs Reviewed  CBC WITH DIFFERENTIAL/PLATELET - Abnormal; Notable for the following components:      Result Value   Platelets 138 (*)    All other components within normal limits  BASIC METABOLIC PANEL - Abnormal; Notable for the following components:   Glucose, Bld 104 (*)    Calcium 8.8 (*)    All other components within normal limits  SEDIMENTATION RATE  TROPONIN I (HIGH SENSITIVITY)     EKG  EKG is remarkable for sinus rhythm with a ventricular rate of 88, normal axis, unremarkable intervals without evidence of acute ischemia or significant  arrhythmia.   RADIOLOGY  Chest x-ray my interpretation without focal consolidation, effusion edema pneumothorax or other clear acute process.  I reviewed radiology's interpretation and agree with the findings of aortic atherosclerosis and some possible scarring versus atypical infection at the apices without other clear acute process.  PROCEDURES:  Critical Care performed: No  Procedures    MEDICATIONS ORDERED IN ED: Medications - No data to display   IMPRESSION / MDM / ASSESSMENT AND PLAN / ED COURSE  I reviewed the triage vital signs and the nursing notes. Patient's presentation is most consistent with acute presentation with potential threat to life or bodily function.                               Differential diagnosis includes, but is not limited to ACS, symptomatic hypertension, anemia, metabolic derangements, temporal arteritis, arrhythmia, with low suspicion based on history and exam for dissection, CVA or PE.  ECG and nonelevated troponin obtained greater than 3 hours after symptom onset is not suggestive of ACS or arrhythmia.  Chest x-ray without acute findings.  I discussed incidental findings of possible scarring and recommendation for outpatient follow-up with regards to this.  ESR is 6 and I have a low suspicion for temporal arteritis.  BMP shows no significant electrolyte or metabolic derangements.  CBC without leukocytosis or acute anemia.  Given duration of symptoms with otherwise reassuring emergency room evaluation work-up I think patient is stable for discharge continued outpatient evaluation with her PCP ophthalmologist and other providers.  Discussed this is a finding on chest x-ray and that she should return immediately for any new or worsening of symptoms.  She is amenable to plan.  Discharged in stable condition.  Strict return precautions advised and discussed.      FINAL CLINICAL IMPRESSION(S) / ED DIAGNOSES   Final diagnoses:  Jaw pain  Chest  tightness  Abnormal chest x-ray     Rx / DC Orders   ED Discharge Orders     None        Note:  This document was prepared using Dragon voice recognition software and may include unintentional dictation errors.   Smith, Zachary P, MD 04/11/22 1230  

## 2022-04-15 ENCOUNTER — Encounter: Payer: Self-pay | Admitting: Family Medicine

## 2022-04-15 ENCOUNTER — Ambulatory Visit (INDEPENDENT_AMBULATORY_CARE_PROVIDER_SITE_OTHER): Payer: PPO | Admitting: Family Medicine

## 2022-04-15 DIAGNOSIS — R9389 Abnormal findings on diagnostic imaging of other specified body structures: Secondary | ICD-10-CM | POA: Diagnosis not present

## 2022-04-15 DIAGNOSIS — R6884 Jaw pain: Secondary | ICD-10-CM | POA: Diagnosis not present

## 2022-04-15 DIAGNOSIS — R439 Unspecified disturbances of smell and taste: Secondary | ICD-10-CM | POA: Diagnosis not present

## 2022-04-15 NOTE — Progress Notes (Unsigned)
Her granddaughter had a runny nose, she had similar.  No fevers.  No vomiting.  Minimal cough.  Took zyrtec in the meantime. It helped some.    She is considering seeing another clinic about varicose veins.  She'll update me as needed.    She had ENT eval with MRI after that:  IMPRESSION:  1. No acute intracranial abnormality or finding to explain anosmia. 2. Scattered small foci of hyperintense T2-weighted signal within the white matter bilaterally in a nonspecific distribution. This may be seen in the setting of migraine headaches or vasculopathy such as early chronic small vessel disease.  She had neuro eval:  - Routine EEG to evaluate olfactory aura. - Start nortriptyline 55m nightly for headaches and sleep difficulty.   She didn't tolerate nortriptyline, didn't feel well on med.    04/05/22 she had B sided jaw pain and had trouble opening her mouth.  Then had L sided chest pain.  04/06/22 she had R sided jaw pain.  She saw dental clinic and had neg eval per patient report.  She had intermittent upper and lower tooth pain.    04/11/22 she had tingling in feet, jaw pain, and felt hot/sweaty.  Went to ER.  Normal ESR, troponin.    CXR 1. Progressive densities at the lung apices bilaterally. While this may represent scarring, infection is not excluded. 2. Aortic atherosclerosis.  She has routine f/u CT scheduled.    She is going to go see her eye clinic soon.  She has glasses but has noted changes in the L eye- not loss but blurrier than prev.    She still has dull ache in the jaw and it can radiate across the mandible or maxilla.  She doesn't know if she grinds her teeth.    Her sense of smell is better but not back to baseline.    Meds, vitals, and allergies reviewed.   ROS: Per HPI unless specifically indicated in ROS section

## 2022-04-15 NOTE — Patient Instructions (Addendum)
Let me check with Dr. B about your CT scan.  I would still get the EEG done.   I would try icing for 5 minutes at a time, 2-3 times a day, and see if that helps.  This could be TMJ pain.  Please ask the dental clinic about a mouth guard.   Plan on recheck in about 2 weeks.    Take care.  Glad to see you.  Ask the front about scheduling a yearly visit for the fall.

## 2022-04-17 ENCOUNTER — Telehealth: Payer: Self-pay | Admitting: Family Medicine

## 2022-04-17 DIAGNOSIS — R9389 Abnormal findings on diagnostic imaging of other specified body structures: Secondary | ICD-10-CM | POA: Insufficient documentation

## 2022-04-17 DIAGNOSIS — R6884 Jaw pain: Secondary | ICD-10-CM | POA: Insufficient documentation

## 2022-04-17 NOTE — Assessment & Plan Note (Addendum)
Progressive densities at the lung apices bilaterally. While this may represent scarring, infection is not excluded. See following phone note.  I will check to see if her follow-up CT needs to be scheduled sooner.  Her lungs are clear on exam and she is not in distress.

## 2022-04-17 NOTE — Telephone Encounter (Signed)
Dr. Corlis Leak patient had recent chest x-ray with progressive densities at the lung apices bilaterally. While this may represent scarring, infection is not excluded.  I recently saw her.  Her lungs are clear and she is not in distress.  Does it make sense for her follow-up CT chest to be moved sooner?  I would greatly appreciate your input.  Thanks.

## 2022-04-17 NOTE — Assessment & Plan Note (Signed)
I do not see any obvious pathology other than her mild tenderness of the bilateral TMJ.  Discussed options and I think it makes sense to treat her for TMJ pain.  I would try icing for 5 minutes at a time, 2-3 times a day, and see if that helps.  I asked her to check with the dental clinic about a mouth guard.   Plan on recheck in about 2 weeks.

## 2022-04-17 NOTE — Assessment & Plan Note (Signed)
I think it makes sense to get her EEG done as scheduled.

## 2022-04-18 ENCOUNTER — Encounter (INDEPENDENT_AMBULATORY_CARE_PROVIDER_SITE_OTHER): Payer: PPO

## 2022-04-18 ENCOUNTER — Ambulatory Visit (INDEPENDENT_AMBULATORY_CARE_PROVIDER_SITE_OTHER): Payer: PPO | Admitting: Vascular Surgery

## 2022-04-18 ENCOUNTER — Telehealth: Payer: Self-pay | Admitting: Internal Medicine

## 2022-04-18 NOTE — Telephone Encounter (Signed)
Thank you Dr. Damita Dunnings for reaching out to [place].  I think it be reasonable to move the scans sooner.  BC-please move the CT scan chest scan to the next 2 weeks or so; and also move up my July appointments.   Thanks GB

## 2022-04-18 NOTE — Telephone Encounter (Signed)
Noted. Thanks.  I will defer o/w.

## 2022-04-19 ENCOUNTER — Encounter: Payer: Self-pay | Admitting: Family Medicine

## 2022-04-20 ENCOUNTER — Inpatient Hospital Stay: Payer: PPO | Attending: Internal Medicine

## 2022-04-20 DIAGNOSIS — M8580 Other specified disorders of bone density and structure, unspecified site: Secondary | ICD-10-CM | POA: Insufficient documentation

## 2022-04-20 DIAGNOSIS — Z08 Encounter for follow-up examination after completed treatment for malignant neoplasm: Secondary | ICD-10-CM | POA: Insufficient documentation

## 2022-04-20 DIAGNOSIS — Z79890 Hormone replacement therapy (postmenopausal): Secondary | ICD-10-CM | POA: Diagnosis not present

## 2022-04-20 DIAGNOSIS — Z85850 Personal history of malignant neoplasm of thyroid: Secondary | ICD-10-CM | POA: Insufficient documentation

## 2022-04-20 DIAGNOSIS — Z85118 Personal history of other malignant neoplasm of bronchus and lung: Secondary | ICD-10-CM | POA: Diagnosis not present

## 2022-04-20 DIAGNOSIS — R918 Other nonspecific abnormal finding of lung field: Secondary | ICD-10-CM | POA: Insufficient documentation

## 2022-04-20 DIAGNOSIS — C3431 Malignant neoplasm of lower lobe, right bronchus or lung: Secondary | ICD-10-CM

## 2022-04-20 DIAGNOSIS — D696 Thrombocytopenia, unspecified: Secondary | ICD-10-CM | POA: Diagnosis not present

## 2022-04-20 LAB — COMPREHENSIVE METABOLIC PANEL
ALT: 20 U/L (ref 0–44)
AST: 18 U/L (ref 15–41)
Albumin: 4 g/dL (ref 3.5–5.0)
Alkaline Phosphatase: 54 U/L (ref 38–126)
Anion gap: 6 (ref 5–15)
BUN: 15 mg/dL (ref 8–23)
CO2: 28 mmol/L (ref 22–32)
Calcium: 8.8 mg/dL — ABNORMAL LOW (ref 8.9–10.3)
Chloride: 101 mmol/L (ref 98–111)
Creatinine, Ser: 0.67 mg/dL (ref 0.44–1.00)
GFR, Estimated: >60 mL/min (ref >60)
Glucose, Bld: 105 mg/dL — ABNORMAL HIGH (ref 70–99)
Potassium: 4 mmol/L (ref 3.5–5.1)
Sodium: 135 mmol/L (ref 135–145)
Total Bilirubin: 0.5 mg/dL (ref 0.3–1.2)
Total Protein: 6.6 g/dL (ref 6.5–8.1)

## 2022-04-21 LAB — THYROID PANEL WITH TSH
Free Thyroxine Index: 3.1 (ref 1.2–4.9)
T3 Uptake Ratio: 30 % (ref 24–39)
T4, Total: 10.4 ug/dL (ref 4.5–12.0)
TSH: 0.215 u[IU]/mL — ABNORMAL LOW (ref 0.450–4.500)

## 2022-04-22 LAB — THYROGLOBULIN ANTIBODY: Thyroglobulin Antibody: 10.2 IU/mL — ABNORMAL HIGH (ref 0.0–0.9)

## 2022-04-26 ENCOUNTER — Ambulatory Visit
Admission: RE | Admit: 2022-04-26 | Discharge: 2022-04-26 | Disposition: A | Discharge status: Home / Self Care | Payer: PPO | Source: Ambulatory Visit | Attending: Internal Medicine | Admitting: Internal Medicine

## 2022-04-26 ENCOUNTER — Other Ambulatory Visit: Payer: PPO

## 2022-04-26 DIAGNOSIS — C3490 Malignant neoplasm of unspecified part of unspecified bronchus or lung: Secondary | ICD-10-CM | POA: Diagnosis not present

## 2022-04-26 DIAGNOSIS — R918 Other nonspecific abnormal finding of lung field: Secondary | ICD-10-CM | POA: Diagnosis not present

## 2022-04-26 DIAGNOSIS — C3431 Malignant neoplasm of lower lobe, right bronchus or lung: Secondary | ICD-10-CM | POA: Insufficient documentation

## 2022-04-28 ENCOUNTER — Inpatient Hospital Stay: Payer: PPO | Admitting: Internal Medicine

## 2022-04-28 ENCOUNTER — Encounter: Payer: Self-pay | Admitting: Internal Medicine

## 2022-04-28 VITALS — BP 150/83 | HR 86 | Temp 98.0°F | Resp 18 | Wt 125.8 lb

## 2022-04-28 DIAGNOSIS — R911 Solitary pulmonary nodule: Secondary | ICD-10-CM | POA: Diagnosis not present

## 2022-04-28 DIAGNOSIS — C3431 Malignant neoplasm of lower lobe, right bronchus or lung: Secondary | ICD-10-CM

## 2022-04-28 DIAGNOSIS — H04123 Dry eye syndrome of bilateral lacrimal glands: Secondary | ICD-10-CM | POA: Diagnosis not present

## 2022-04-28 DIAGNOSIS — Z08 Encounter for follow-up examination after completed treatment for malignant neoplasm: Secondary | ICD-10-CM | POA: Diagnosis not present

## 2022-04-28 MED ORDER — LEVOTHYROXINE SODIUM 75 MCG PO TABS: 75.0000 ug | ORAL_TABLET | Freq: Every day | ORAL | 3 refills | Status: DC

## 2022-04-28 NOTE — Progress Notes (Signed)
Patient presented to ED a couple weeks ago due to jaw pain, left side chest pain, and elevated bp.  Abnormal chest xray at ED warranted earlier f/u with Dr. Rogue Bussing.    Having change in smell sensation and headaches that has/is being evaluated by ENT and neurology.

## 2022-04-28 NOTE — Assessment & Plan Note (Addendum)
#   RLL Lung ca; Stage I;  no adjuvant therapy. July 2022-- CT scan- STABLE; Status post right lower lobectomy. No evidence of recurrent or metastatic disease. STABLE.   # JUNE 2023- CT scan:  Progressive enlargement of a ground-glass density right upper lobe nodule over the last 2.5 years, suspicious for low-grade adenocarcinoma.  Other smaller sub-centimeter faint foci of ground-glass density primarily in the right lung and to a lesser extent in the left upper lobe are stable and indeterminate.  Recommend a PET scan for further evaluation.   # Thyroid cancer [FEB 2017] status post thyroidectomy low risk stage I; on Synthroid 88 mcg. FEB 2023-thyroid ultrasound no evidence of recurrence. JUNE 2023--TSH- 0.2 [ symptoms of fatigue/anxiety]- will decrease dose of synthroid- 7mcg.    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  Monitor for now  # Incidental findings on Imaging CT scan, JUNE  2023: Coronary atherosclerosis; arteriosclerosis noted-patient awaiting evaluation with cardiology.  I reviewed/discussed/counseled the patient.   * PET scan mdt on July 13th.  # DISPOSITION: # PET scan in 1-2 weeks.  # follow up in 2-3 weeks; MD; no labs--- Dr.B  # I reviewed the blood work- with the patient in detail; also reviewed the imaging independently [as summarized above]; and with the patient in detail.

## 2022-04-28 NOTE — Progress Notes (Signed)
[place] OFFICE PROGRESS NOTE  Patient Care Team: Tonia Ghent, MD as PCP - General (Family Medicine) Kate Sable, MD as PCP - Cardiology (Cardiology) Beverly Gust, MD as Consulting Physician (Otolaryngology) Nestor Lewandowsky, MD (Inactive) as Consulting Physician (Cardiothoracic Surgery) Cammie Sickle, MD as Consulting Physician (Internal Medicine) Birder Robson, MD as Consulting Physician (Ophthalmology) Clayburn Pert, MD as Consulting Physician (General Surgery) Charlton Haws, [facility] as Pharmacist (Pharmacist)   Cancer Staging  Thyroid cancer [facility]) Staging form: Thyroid - Papillary or Follicular (Under 45 years), AJCC 7th Edition - Clinical: Stage I (T1, N0, M0) - Signed by Forest Gleason, MD on 09/11/2015 Laterality: Right    Oncology History Overview Note  # NOV 2016- RLL [place] ca T1N0 [incidental; Dr.Simonds; Bronc- s/p RLlobectomy; Dr.Oaks;Dec 2016]  # FEB 2017- 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 STAGE I; s/p Total Thyroidetcomy [Dr.Chapman];NO RAIU.  LOW RISK; but detectable thyroglobulin- on Synthroid  #Survivorship-pending  DIAGNOSIS: Lung cancer -stage I #thyroid cancer low risk  GOALS: Cure  CURRENT/MOST RECENT THERAPY: Surveillance    Thyroid cancer ([place])  Cancer of lower lobe of right lung ([place])  05/06/2016 Initial Diagnosis   Malignant neoplasm of lower lobe, right bronchus or lung ([place])    INTERVAL HISTORY: Accompanied by her husband.  Ambulating independently.   [name] 76 y.o.  female pleasant patient above history of stage I lung cancer and also stage I thyroid cancer on synthroid currently on surveillance is here for follow-up/review results of the CT scan.  Patient was evaluated recently for vague musculoskeletal symptoms; worsening fatigue etc.  Also evaluated by neurology for dizziness.  Chest x-ray-was abnormal which led to  scheduling the CT scan sooner.  Patient denies any fever or chills or cough.  Denies any nausea vomiting abdominal pain.  Denies any headaches.    Review of Systems  Constitutional:  Positive for malaise/fatigue. Negative for chills, diaphoresis, fever and weight loss.  HENT:  Negative for nosebleeds and sore throat.   Eyes:  Negative for double vision.  Respiratory:  Negative for cough, hemoptysis, sputum production, shortness of breath and wheezing.   Cardiovascular:  Negative for chest pain, palpitations, orthopnea and leg swelling.  Gastrointestinal:  Negative for abdominal pain, blood in stool, constipation, diarrhea, heartburn, melena and vomiting.  Genitourinary:  Negative for dysuria, frequency and urgency.  Musculoskeletal:  Positive for back pain. Negative for joint pain.  Skin: Negative.  Negative for itching and rash.  Neurological:  Positive for tingling. Negative for dizziness, focal weakness, weakness and headaches.  Endo/Heme/Allergies:  Does not bruise/bleed easily.  Psychiatric/Behavioral:  Negative for depression. The patient is not nervous/anxious and does not have insomnia.       PAST MEDICAL HISTORY :  Past Medical History:  Diagnosis Date  . Adenocarcinoma of right lung ([place]) 10/01/2015  . GERD (gastroesophageal reflux disease)   . GIB (gastrointestinal bleeding)   . Groin pain    Along superior/laterl R inguinal canal.  Could be due to hernia or old scar tissue.  prev with CT done w/o alarming findings.  Will treat episodically.  Use tylenol #3 prn.    . Hyperlipidemia   . Lipoma of thigh    Left  . Normal cardiac stress test 2014  . PONV (postoperative nausea and vomiting)    nausea after hysterectomy, and 2 days after lung surgery 10/2015  . Thyroid cancer ([place]) 12/2015  . Thyroid nodule  PAST SURGICAL HISTORY :   Past Surgical History:  Procedure Laterality Date  . ABDOMINAL HYSTERECTOMY    . BREAST EXCISIONAL BIOPSY Right 1977  . BREAST MASS  EXCISION  1977   benign  . CARPAL TUNNEL RELEASE Right 1978  . CATARACT EXTRACTION, BILATERAL Bilateral   . CHOLECYSTECTOMY  11/23/2016   Procedure: LAPAROSCOPIC CHOLECYSTECTOMY;  Surgeon: Clayburn Pert, MD;  Location: [facility];  Service: General;;  . COLONOSCOPY  2009  . COLONOSCOPY WITH PROPOFOL N/A 05/18/2019   Procedure: COLONOSCOPY WITH PROPOFOL;  Surgeon: Virgel Manifold, MD;  Location: [facility];  Service: Endoscopy;  Laterality: N/A;  . COLONOSCOPY WITH PROPOFOL N/A 08/15/2019   Procedure: COLONOSCOPY WITH PROPOFOL;  Surgeon: Virgel Manifold, MD;  Location: [facility];  Service: Endoscopy;  Laterality: N/A;  . DILATION AND CURETTAGE OF UTERUS  2001  . ELECTROMAGNETIC NAVIGATION BROCHOSCOPY N/A 08/25/2015   Procedure: ELECTROMAGNETIC NAVIGATION BRONCHOSCOPY;  Surgeon: Flora Lipps, MD;  Location: [facility];  Service: Cardiopulmonary;  Laterality: N/A;  . ENDOBRONCHIAL ULTRASOUND N/A 08/25/2015   Procedure: ENDOBRONCHIAL ULTRASOUND;  Surgeon: Flora Lipps, MD;  Location: [facility];  Service: Cardiopulmonary;  Laterality: N/A;  . ESOPHAGOGASTRODUODENOSCOPY  2002, 11/15/07   Normal (Dr. Allyn Kenner)  . ESOPHAGOGASTRODUODENOSCOPY (EGD) WITH PROPOFOL N/A 05/18/2019   Procedure: ESOPHAGOGASTRODUODENOSCOPY (EGD) WITH PROPOFOL;  Surgeon: Virgel Manifold, MD;  Location: [facility];  Service: Endoscopy;  Laterality: N/A;  . ESOPHAGOGASTRODUODENOSCOPY (EGD) WITH PROPOFOL N/A 08/15/2019   Procedure: ESOPHAGOGASTRODUODENOSCOPY (EGD) WITH PROPOFOL;  Surgeon: Virgel Manifold, MD;  Location: [facility];  Service: Endoscopy;  Laterality: N/A;  . FACIAL COSMETIC SURGERY  01/13/10   mini facelift  . HERNIA REPAIR  1976  . OOPHORECTOMY    . THORACOTOMY/LOBECTOMY Right 10/27/2015   Procedure: THORACOTOMY/LOBECTOMY;  Surgeon: Nestor Lewandowsky, MD;  Location: [facility];  Service: Thoracic;  Laterality: Right;  . THYROIDECTOMY N/A 12/29/2015   Procedure: THYROIDECTOMY;  Surgeon:  Beverly Gust, MD;  Location: [facility];  Service: ENT;  Laterality: N/A;  . TOTAL VAGINAL HYSTERECTOMY  12/30/08   for fibroid pain (Dr. Gertie Fey)    FAMILY HISTORY :   Family History  Problem Relation Age of Onset  . Transient ischemic attack Mother   . Hypertension Mother   . Stroke Mother        mini strokes  . Hypertension Sister   . Cancer Sister        ovarian  . Hypertension Sister   . Hypertension Sister   . Hypertension Sister   . Breast cancer Neg Hx   . Colon cancer Neg Hx     SOCIAL HISTORY:   Social History   Tobacco Use  . Smoking status: Never  . Smokeless tobacco: Never  Vaping Use  . Vaping Use: Never used  Substance Use Topics  . Alcohol use: No    Alcohol/week: 0.0 standard drinks of alcohol  . Drug use: No    ALLERGIES:  is allergic to atorvastatin, ciprofloxacin, epinephrine, metoprolol succinate, nortriptyline, simvastatin, and sulfa antibiotics.  MEDICATIONS:  Current Outpatient Medications  Medication Sig Dispense Refill  . Ascorbic Acid (VITAMIN C) 500 MG tablet Take 2,000 mg by mouth daily. Reported on 02/04/2016    . aspirin EC 81 MG tablet Take 1 tablet (81 mg total) by mouth daily.    . Biotin 1000 MCG tablet Take 1,000 mcg by mouth daily. Reported on 02/04/2016    . Coenzyme Q10 200 MG capsule Take 100 mg by mouth daily.    Marland Kitchen  levothyroxine (SYNTHROID) 75 MCG tablet Take 1 tablet (75 mcg total) by mouth daily before breakfast. 30 tablet 3  . levothyroxine (SYNTHROID) 88 MCG tablet TAKE 1 TABLET BY MOUTH DAILY BEFORE BREAKFAST. 90 tablet 3  . Lutein 20 MG CAPS Take 20 mg by mouth daily.    . pantoprazole (PROTONIX) 40 MG tablet Take 1 tablet (40 mg total) by mouth daily. 30 tablet 3  . Potassium Gluconate 595 MG CAPS Take 595 mg by mouth daily.     . pravastatin (PRAVACHOL) 40 MG tablet TAKE 1 TABLET BY MOUTH EVERY DAY 90 tablet 3   No current facility-administered medications for this visit.    PHYSICAL EXAMINATION: ECOG PERFORMANCE  STATUS: 1 - Symptomatic but completely ambulatory  BP (!) 150/83 (BP Location: Right Arm, Patient Position: Sitting)   Pulse 86   Temp 98 F (36.7 C) (Tympanic)   Resp 18   Wt 125 lb 12.8 oz (57.1 kg)   BMI 21.59 kg/m   Filed Weights   04/28/22 0900  Weight: 125 lb 12.8 oz (57.1 kg)     Physical Exam HENT:     Head: Normocephalic and atraumatic.     Mouth/Throat:     Pharynx: No oropharyngeal exudate.  Eyes:     Pupils: Pupils are equal, round, and reactive to light.  Cardiovascular:     Rate and Rhythm: Normal rate and regular rhythm.  Pulmonary:     Effort: No respiratory distress.     Breath sounds: No wheezing.  Abdominal:     General: Bowel sounds are normal. There is no distension.     Palpations: Abdomen is soft. There is no mass.     Tenderness: There is no abdominal tenderness. There is no guarding or rebound.  Musculoskeletal:        General: No tenderness. Normal range of motion.     Cervical back: Normal range of motion and neck supple.  Skin:    General: Skin is warm.  Neurological:     Mental Status: She is alert and oriented to person, place, and time.  Psychiatric:        Mood and Affect: Affect normal.   LABORATORY DATA:  I have reviewed the data as listed    Component Value Date/Time   NA 135 04/20/2022 1515   NA 141 07/01/2013 0103   K 4.0 04/20/2022 1515   K 3.8 07/01/2013 0103   CL 101 04/20/2022 1515   CL 110 (H) 07/01/2013 0103   CO2 28 04/20/2022 1515   CO2 27 07/01/2013 0103   GLUCOSE 105 (H) 04/20/2022 1515   GLUCOSE 88 07/01/2013 0103   BUN 15 04/20/2022 1515   BUN 11 07/01/2013 0103   CREATININE 0.67 04/20/2022 1515   CREATININE 0.68 07/01/2013 0103   CALCIUM 8.8 (L) 04/20/2022 1515   CALCIUM 8.2 (L) 07/01/2013 0103   PROT 6.6 04/20/2022 1515   PROT 5.5 (L) 07/01/2013 0103   ALBUMIN 4.0 04/20/2022 1515   ALBUMIN 3.0 (L) 07/01/2013 0103   AST 18 04/20/2022 1515   AST 19 07/01/2013 0103   ALT 20 04/20/2022 1515   ALT 24  07/01/2013 0103   ALKPHOS 54 04/20/2022 1515   ALKPHOS 55 07/01/2013 0103   BILITOT 0.5 04/20/2022 1515   BILITOT 0.7 07/01/2013 0103   GFRNONAA >60 04/20/2022 1515   GFRNONAA >60 07/01/2013 0103   GFRAA >60 05/19/2019 0117   GFRAA >60 07/01/2013 0103    No results found for: "SPEP", "UPEP"  Lab  Results  Component Value Date   WBC 5.0 04/11/2022   NEUTROABS 3.5 04/11/2022   HGB 13.0 04/11/2022   HCT 40.6 04/11/2022   MCV 94.6 04/11/2022   PLT 138 (L) 04/11/2022      Chemistry      Component Value Date/Time   NA 135 04/20/2022 1515   NA 141 07/01/2013 0103   K 4.0 04/20/2022 1515   K 3.8 07/01/2013 0103   CL 101 04/20/2022 1515   CL 110 (H) 07/01/2013 0103   CO2 28 04/20/2022 1515   CO2 27 07/01/2013 0103   BUN 15 04/20/2022 1515   BUN 11 07/01/2013 0103   CREATININE 0.67 04/20/2022 1515   CREATININE 0.68 07/01/2013 0103      Component Value Date/Time   CALCIUM 8.8 (L) 04/20/2022 1515   CALCIUM 8.2 (L) 07/01/2013 0103   ALKPHOS 54 04/20/2022 1515   ALKPHOS 55 07/01/2013 0103   AST 18 04/20/2022 1515   AST 19 07/01/2013 0103   ALT 20 04/20/2022 1515   ALT 24 07/01/2013 0103   BILITOT 0.5 04/20/2022 1515   BILITOT 0.7 07/01/2013 0103       RADIOGRAPHIC STUDIES: I have personally reviewed the radiological images as listed and agreed with the findings in the report. No results found.   ASSESSMENT & PLAN:  Cancer of lower lobe of right lung ([place]) # RLL Lung ca; Stage I;  no adjuvant therapy. July 2022-- CT scan- STABLE; Status post right lower lobectomy. No evidence of recurrent or metastatic disease. STABLE.    # JUNE 2023- CT scan:  Progressive enlargement of a ground-glass density right upper lobe nodule over the last 2.5 years, suspicious for low-grade adenocarcinoma.  Other smaller sub-centimeter faint foci of ground-glass density primarily in the right lung and to a lesser extent in the left upper lobe are stable and indeterminate.  Recommend a PET scan  for further evaluation.   # Thyroid cancer [FEB 2017] status post thyroidectomy low risk stage I; on Synthroid 88 mcg. FEB 2023-thyroid ultrasound no evidence of recurrence. JUNE 2023--TSH- 0.2 [ symptoms of fatigue/anxiety]- will decrease dose of synthroid- 92mcg.    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  Monitor for now  # Incidental findings on Imaging CT scan, JUNE  2023: Coronary atherosclerosis; arteriosclerosis noted-patient awaiting evaluation with cardiology.  I reviewed/discussed/counseled the patient.   * PET scan mdt on July 13th.  # DISPOSITION: # PET scan in 1-2 weeks.  # follow up in 2-3 weeks; MD; no labs--- Dr.B  # I reviewed the blood work- with the patient in detail; also reviewed the imaging independently [as summarized above]; and with the patient in detail.     Orders Placed This Encounter  Procedures  . NM PET Image Restage (PS) Skull Base to Thigh (F-18 FDG)    Standing Status:   Future    Standing Expiration Date:   04/29/2023    Order Specific Question:   If indicated for the ordered procedure, I authorize the administration of a radiopharmaceutical per Radiology protocol    Answer:   Yes    Order Specific Question:   Preferred imaging location?    Answer:   [facility]   All questions were answered. The patient knows to call the clinic with any problems, questions or concerns.      Cammie Sickle, MD 04/28/2022 5:10 PM

## 2022-04-29 ENCOUNTER — Encounter: Payer: Self-pay | Admitting: Cardiology

## 2022-04-29 ENCOUNTER — Ambulatory Visit (INDEPENDENT_AMBULATORY_CARE_PROVIDER_SITE_OTHER): Payer: PPO | Admitting: Family Medicine

## 2022-04-29 ENCOUNTER — Encounter: Payer: Self-pay | Admitting: Family Medicine

## 2022-04-29 ENCOUNTER — Ambulatory Visit: Payer: PPO | Admitting: Cardiology

## 2022-04-29 VITALS — BP 132/70 | HR 92 | Ht 64.0 in | Wt 125.2 lb

## 2022-04-29 DIAGNOSIS — R911 Solitary pulmonary nodule: Secondary | ICD-10-CM

## 2022-04-29 DIAGNOSIS — I2510 Atherosclerotic heart disease of native coronary artery without angina pectoris: Secondary | ICD-10-CM | POA: Diagnosis not present

## 2022-04-29 DIAGNOSIS — I700 Atherosclerosis of aorta: Secondary | ICD-10-CM | POA: Diagnosis not present

## 2022-04-29 DIAGNOSIS — E7800 Pure hypercholesterolemia, unspecified: Secondary | ICD-10-CM

## 2022-04-29 DIAGNOSIS — I2584 Coronary atherosclerosis due to calcified coronary lesion: Secondary | ICD-10-CM | POA: Diagnosis not present

## 2022-04-29 DIAGNOSIS — K219 Gastro-esophageal reflux disease without esophagitis: Secondary | ICD-10-CM | POA: Diagnosis not present

## 2022-04-29 DIAGNOSIS — R072 Precordial pain: Secondary | ICD-10-CM | POA: Diagnosis not present

## 2022-04-29 DIAGNOSIS — E039 Hypothyroidism, unspecified: Secondary | ICD-10-CM | POA: Diagnosis not present

## 2022-04-29 DIAGNOSIS — R6884 Jaw pain: Secondary | ICD-10-CM | POA: Diagnosis not present

## 2022-04-29 MED ORDER — IVABRADINE HCL 5 MG PO TABS: 15.0000 mg | ORAL_TABLET | Freq: Once | ORAL | 0 refills | Status: AC

## 2022-04-29 MED ORDER — PANTOPRAZOLE SODIUM 40 MG PO TBEC: 40.0000 mg | DELAYED_RELEASE_TABLET | Freq: Every day | ORAL | 3 refills | Status: DC

## 2022-04-29 MED ORDER — METOPROLOL TARTRATE 100 MG PO TABS: 100.0000 mg | ORAL_TABLET | Freq: Once | ORAL | 0 refills | Status: DC

## 2022-04-29 NOTE — Progress Notes (Unsigned)
Levothyroxine was changed recently to 56mcg 1 tab a day.  D/w pt.  Per outside clinic.  She has cardiology f/u pending.  We talked about atherosclerotic changes noted on imaging.  She is still on statin.  No exertional CP.  She is still [place] a mile [place] the mall.    Lung nodule.  We talked about recent imaging and plan for f/u PET scan, that is pending.    She had eye clinic f/u yesterday.  She had glasses for distance.  She saw Dr. Edison Pace at [facility].  She is going to get new glasses and that should help.    Jaw is better with icing.  D/w pt.  She'll f/u with dental clinic if needed.    She had EEG pending per neurology.    Meds, vitals, and allergies reviewed.   ROS: Per HPI unless specifically indicated in ROS section   GEN: nad, alert and oriented HEENT: ncat NECK: supple w/o LA CV: rrr. PULM: ctab, no inc wob ABD: soft, +bs EXT: no edema SKIN: no acute rash   30 minutes were devoted to patient care in this encounter (this includes time spent reviewing the patient's file/history, interviewing and examining the patient, counseling/reviewing plan with patient).

## 2022-04-29 NOTE — Patient Instructions (Signed)
I'll await your follow up notes and PET scan.  I wouldn't change your meds for now.  Take care.  Glad to see you.

## 2022-04-29 NOTE — Patient Instructions (Signed)
Medication Instructions:   Your physician recommends that you continue on your current medications as directed. Please refer to the Current Medication list given to you today.  *If you need a refill on your cardiac medications before your next appointment, please call your pharmacy*   Testing/Procedures:  Your physician has requested that you have cardiac CT. Cardiac computed tomography (CT) is a painless test that uses an x-ray machine to take clear, detailed pictures of your heart.    Your cardiac CT will be scheduled at:  [place] in at the [facility] at 9:45 AM on Thurs 7/6 for a 10:00 appointment   Please follow these instructions carefully (unless otherwise directed):    On the Night Before the Test: Be sure to Drink plenty of water. Do not consume any caffeinated/decaffeinated beverages or chocolate 12 hours prior to your test.    On the Day of the Test: Drink plenty of water until 1 hour prior to the test. Do not eat any food 4 hours prior to the test. You may take your regular medications prior to the test.  Take metoprolol (Lopressor) 100 MG two hours prior to test. Take Ivabradine (Corlanor) 15 MG two hours prior to test. FEMALES- please wear underwire-free bra if available, avoid dresses & tight clothing       After the Test: Drink plenty of water. After receiving IV contrast, you may experience a mild flushed feeling. This is normal. On occasion, you may experience a mild rash up to 24 hours after the test. This is not dangerous. If this occurs, you can take Benadryl 25 mg and increase your fluid intake. If you experience trouble breathing, this can be serious. If it is severe call 911 IMMEDIATELY. If it is mild, please call our office. If you take any of these medications: Glipizide/Metformin, Avandament, Glucavance, please do not take 48 hours after completing test unless otherwise instructed.  Please allow 2-4 weeks for  scheduling of routine cardiac CTs. Some insurance companies require a pre-authorization which may delay scheduling of this test.   For non-scheduling related questions, please contact the cardiac imaging nurse navigator should you have any questions/concerns: Marchia Bond, Cardiac Imaging Nurse Navigator Gordy Clement, Cardiac Imaging Nurse Navigator [facility] Direct Office Dial: [phone]   For scheduling needs, including cancellations and rescheduling, please call [place], [phone].     Follow-Up: At [facility], you and your health needs are our priority.  As part of our continuing mission to provide you with exceptional heart care, we have created designated Provider Care Teams.  These Care Teams include your primary Cardiologist (physician) and Advanced Practice Providers (APPs -  Physician Assistants and Nurse Practitioners) who all work together to provide you with the care you need, when you need it.  We recommend signing up for the patient portal called "MyChart".  Sign up information is provided on this After Visit Summary.  MyChart is used to connect with patients for Virtual Visits (Telemedicine).  Patients are able to view lab/test results, encounter notes, upcoming appointments, etc.  Non-urgent messages can be sent to your provider as well.   To learn more about what you can do with MyChart, go to [email or web address].    Your next appointment:   Follow up after testing   The format for your next appointment:   In Person  Provider:   You may see Kate Sable, MD or one of the following Advanced Practice Providers on your designated  Care Team:   Murray Hodgkins, NP Christell Faith, PA-C Cadence Kathlen Mody, [place]    Other Instructions   Important Information About Sugar

## 2022-04-29 NOTE — Progress Notes (Signed)
Cardiology Office Note:    Date:  04/29/2022   ID:  [name], DOB 1946-05-11, MRN [id number]  PCP:  Tonia Ghent, MD  Cardiologist:  Kate Sable, MD  Electrophysiologist:  None   Referring MD: Tonia Ghent, MD   Chief Complaint  Patient presents with   Other    12-18 Month f/u 6/12 pt went to ED due to jaw pain and elevated BP. Meds reviewed verbally with pt.    History of Present Illness:    [name] is a 76 y.o. female with a hx of hyperlipidemia, coronary calcifications, pulmonary nodule, who presents for follow-up.   She endorses having jaw pain about 2 weeks ago prompting her to go to the emergency room.  Symptoms began after eating something.  States being anxious, checked her blood pressure at home with systolics in the 998P.  Presented to the ED, BP was elevated at 161. EKG showed no acute ischemia, troponins were normal.  Compliant with medications including aspirin and Pravachol as prescribed.  Since then, she has had occasional nonspecific chest discomfort and neck pain.  Also has some abnormality in her lungs on recent chest CT, PET scanning is being planned.   Prior notes chest CT 04/2020 showed aortic atherosclerosis and also calcifications in the LAD.   Echocardiogram 12/2019 showed normal systolic and diastolic function, EF 60 to 65%. Lexiscan Myoview 12/2019, normal, no evidence for ischemia. History of statin intolerance/myalgias with simvastatin, Lipitor.  Able to tolerate Pravachol.   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  Could be due to hernia or old scar tissue.  prev with CT done w/o alarming findings.  Will treat episodically.  Use tylenol #3 prn.     Hyperlipidemia    Lipoma of thigh    Left   Normal cardiac stress test 2014   PONV (postoperative  nausea and vomiting)    nausea after hysterectomy, and 2 days after lung surgery 10/2015   Thyroid cancer ([place]) 12/2015   Thyroid nodule     Past Surgical History:  Procedure Laterality Date   ABDOMINAL HYSTERECTOMY     BREAST EXCISIONAL BIOPSY Right 1977   BREAST MASS EXCISION  1977   benign   CARPAL TUNNEL RELEASE Right 1978   CATARACT EXTRACTION, BILATERAL Bilateral    CHOLECYSTECTOMY  11/23/2016   Procedure: LAPAROSCOPIC CHOLECYSTECTOMY;  Surgeon: Clayburn Pert, MD;  Location: [facility] ORS;  Service: General;;   COLONOSCOPY  2009   COLONOSCOPY WITH PROPOFOL N/A 05/18/2019   Procedure: COLONOSCOPY WITH PROPOFOL;  Surgeon: Virgel Manifold, MD;  Location: [facility] ENDOSCOPY;  Service: Endoscopy;  Laterality: N/A;   COLONOSCOPY WITH PROPOFOL N/A 08/15/2019   Procedure: COLONOSCOPY WITH PROPOFOL;  Surgeon: Virgel Manifold, MD;  Location: [facility] ENDOSCOPY;  Service: Endoscopy;  Laterality: N/A;   DILATION AND CURETTAGE OF UTERUS  2001   ELECTROMAGNETIC NAVIGATION BROCHOSCOPY N/A 08/25/2015   Procedure: ELECTROMAGNETIC NAVIGATION BRONCHOSCOPY;  Surgeon: Flora Lipps, MD;  Location: [facility] ORS;  Service: Cardiopulmonary;  Laterality: N/A;   ENDOBRONCHIAL ULTRASOUND N/A 08/25/2015   Procedure: ENDOBRONCHIAL ULTRASOUND;  Surgeon: Flora Lipps, MD;  Location: [facility] ORS;  Service: Cardiopulmonary;  Laterality: N/A;   ESOPHAGOGASTRODUODENOSCOPY  2002, 11/15/07   Normal (Dr. Allyn Kenner)   ESOPHAGOGASTRODUODENOSCOPY (EGD) WITH PROPOFOL N/A 05/18/2019   Procedure: ESOPHAGOGASTRODUODENOSCOPY (EGD) WITH PROPOFOL;  Surgeon: Virgel Manifold, MD;  Location: [facility] ENDOSCOPY;  Service: Endoscopy;  Laterality: N/A;   ESOPHAGOGASTRODUODENOSCOPY (EGD) WITH PROPOFOL N/A 08/15/2019   Procedure: ESOPHAGOGASTRODUODENOSCOPY (EGD) WITH PROPOFOL;  Surgeon: Virgel Manifold, MD;  Location: [facility] ENDOSCOPY;  Service: Endoscopy;  Laterality: N/A;   FACIAL COSMETIC SURGERY  01/13/10   mini facelift   HERNIA REPAIR   1976   OOPHORECTOMY     THORACOTOMY/LOBECTOMY Right 10/27/2015   Procedure: THORACOTOMY/LOBECTOMY;  Surgeon: Nestor Lewandowsky, MD;  Location: [facility] ORS;  Service: Thoracic;  Laterality: Right;   THYROIDECTOMY N/A 12/29/2015   Procedure: THYROIDECTOMY;  Surgeon: Beverly Gust, MD;  Location: [facility] ORS;  Service: ENT;  Laterality: N/A;   TOTAL VAGINAL HYSTERECTOMY  12/30/08   for fibroid pain (Dr. Gertie Fey)    Current Medications: Current Meds  Medication Sig   Ascorbic Acid (VITAMIN C) 500 MG tablet Take 2,000 mg by mouth daily. Reported on 02/04/2016   aspirin EC 81 MG tablet Take 1 tablet (81 mg total) by mouth daily.   Biotin 1000 MCG tablet Take 1,000 mcg by mouth daily. Reported on 02/04/2016   Coenzyme Q10 200 MG capsule Take 100 mg by mouth daily.   ivabradine (CORLANOR) 5 MG TABS tablet Take 3 tablets (15 mg total) by mouth once for 1 dose. Take 2 hours prior to your CT scan.   levothyroxine (SYNTHROID) 75 MCG tablet Take 1 tablet (75 mcg total) by mouth daily before breakfast.   Lutein 20 MG CAPS Take 20 mg by mouth daily.   metoprolol tartrate (LOPRESSOR) 100 MG tablet Take 1 tablet (100 mg total) by mouth once for 1 dose. Take 2 hours prior to your CT scan.   pantoprazole (PROTONIX) 40 MG tablet Take 1 tablet (40 mg total) by mouth daily.   Potassium Gluconate 595 MG CAPS Take 595 mg by mouth daily.    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  He has short term memory loss.     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   Previously had an adopted son who died in 02/06/2020.   Social Determinants of Health   Financial Resource Strain: Low Risk  (12/13/2021)   Overall Financial Resource Strain (CARDIA)    Difficulty of Paying Living Expenses: Not hard at all  Food Insecurity: No Food Insecurity (12/13/2021)   Hunger Vital Sign    Worried About Running Out of Food in the Last Year: Never true    Ran Out of Food in the Last Year: Never true  Transportation Needs: No Transportation Needs (06/10/2020)  PRAPARE - Hydrologist (Medical): No    Lack of Transportation (Non-Medical): No  Physical Activity: Sufficiently Active (06/10/2020)   Exercise Vital Sign    Days of Exercise per Week: 3 days    Minutes of Exercise per Session: 60 min  Stress: No Stress Concern Present (06/10/2020)   [place]    Feeling of Stress : Not at all  Social Connections: Not on file     Family History: The patient's family history includes Cancer in her sister; Hypertension in her mother, sister, sister, sister, and sister; Stroke in her mother; Transient ischemic attack in her mother. There is no history of Breast cancer or Colon cancer.  ROS:   Please see the history of present illness.     All other systems reviewed and are negative.  EKGs/Labs/Other Studies Reviewed:    The following studies were reviewed today:   EKG:  EKG is  ordered today.  The ekg ordered today demonstrates normal sinus rhythm, normal ekg.  Recent Labs: 04/11/2022: Hemoglobin 13.0; Platelets 138 04/20/2022: ALT 20; BUN  15; Creatinine, Ser 0.67; Potassium 4.0; Sodium 135; TSH 0.215  Recent Lipid Panel    Component Value Date/Time   CHOL 161 06/25/2021 0901   CHOL 145 07/01/2013 0103   TRIG 140.0 06/25/2021 0901   TRIG 224 (H) 07/01/2013 0103   HDL 45.30 06/25/2021 0901   HDL 29 (L) 07/01/2013 0103   CHOLHDL 4 06/25/2021 0901   VLDL 28.0 06/25/2021 0901   VLDL 45 (H) 07/01/2013 0103   LDLCALC 88 06/25/2021 0901   LDLCALC 71 07/01/2013 0103   LDLDIRECT 83.8 03/11/2011 0823    Physical Exam:    VS:  BP 132/70 (BP Location: Left Arm, Patient Position: Sitting, Cuff Size: Normal)   Pulse 92   Ht 5\' 4"  (1.626 m)   Wt 125 lb 4 oz (56.8 kg)   SpO2 96%   BMI 21.50 kg/m     Wt Readings from Last 3 Encounters:  04/29/22 125 lb 4 oz (56.8 kg)  04/29/22 125 lb (56.7 kg)  04/28/22 125 lb 12.8 oz (57.1 kg)     GEN:  Well nourished, well developed in no acute distress HEENT: Normal NECK: No JVD; No carotid bruits CARDIAC: RRR, no murmurs, rubs, gallops RESPIRATORY:  Clear to auscultation without rales, wheezing or rhonchi  ABDOMEN: Soft, non-tender, non-distended MUSCULOSKELETAL:  No edema; No deformity  SKIN: Warm and dry NEUROLOGIC:  Alert and oriented x 3 PSYCHIATRIC:  Normal affect   ASSESSMENT:    1. Coronary artery calcification   2. Precordial pain   3. Pure hypercholesterolemia     PLAN:    In order of problems listed above:  Chest pain, history of coronary artery calcification.  Coronary CTA.  Echo in 2021 was normal, EF 60 to 65%.  Continue aspirin, pravastatin 40 mg daily. Hyperlipidemia, cholesterol controlled, continue pravastatin.  Did not tolerate Lipitor and simvastatin in the past  Follow-up after coronary CTA.   Medication Adjustments/Labs and Tests Ordered: Current medicines are reviewed at length with the patient today.  Concerns regarding medicines are outlined above.  Orders Placed This Encounter  Procedures   CT CORONARY MORPH W/CTA COR W/SCORE W/CA W/CM &/OR  WO/CM   EKG 12-Lead   Meds ordered this encounter  Medications   metoprolol tartrate (LOPRESSOR) 100 MG tablet    Sig: Take 1 tablet (100 mg total) by mouth once for 1  dose. Take 2 hours prior to your CT scan.    Dispense:  1 tablet    Refill:  0   ivabradine (CORLANOR) 5 MG TABS tablet    Sig: Take 3 tablets (15 mg total) by mouth once for 1 dose. Take 2 hours prior to your CT scan.    Dispense:  3 tablet    Refill:  0    Patient Instructions  Medication Instructions:   Your physician recommends that you continue on your current medications as directed. Please refer to the Current Medication list given to you today.  *If you need a refill on your cardiac medications before your next appointment, please call your pharmacy*   Testing/Procedures:  Your physician has requested that you have cardiac CT. Cardiac computed tomography (CT) is a painless test that uses an x-ray machine to take clear, detailed pictures of your heart.    Your cardiac CT will be scheduled at:  [place] at 9:45 AM on Thurs 7/6 for a 10:00 appointment   Please follow these instructions carefully (unless otherwise directed):    On the Night Before the Test: Be sure to Drink plenty of water. Do not consume any caffeinated/decaffeinated beverages or chocolate 12 hours prior to your test.    On the Day of the Test: Drink plenty of water until 1 hour prior to the test. Do not eat any food 4 hours prior to the test. You may take your regular medications prior to the test.  Take metoprolol (Lopressor) 100 MG two hours prior to test. Take Ivabradine (Corlanor) 15 MG two hours prior to test. FEMALES- please wear underwire-free bra if available, avoid dresses & tight clothing       After the Test: Drink plenty of water. After receiving IV contrast, you may experience a mild flushed feeling. This is normal. On occasion, you may experience a mild rash up  to 24 hours after the test. This is not dangerous. If this occurs, you can take Benadryl 25 mg and increase your fluid intake. If you experience trouble breathing, this can be serious. If it is severe call 911 IMMEDIATELY. If it is mild, please call our office. If you take any of these medications: Glipizide/Metformin, Avandament, Glucavance, please do not take 48 hours after completing test unless otherwise instructed.  Please allow 2-4 weeks for scheduling of routine cardiac CTs. Some insurance companies require a pre-authorization which may delay scheduling of this test.   For non-scheduling related questions, please contact the cardiac imaging nurse navigator should you have any questions/concerns: Marchia Bond, Cardiac Imaging Nurse Navigator Gordy Clement, Cardiac Imaging Nurse Navigator [facility] Direct Office Dial: [phone]   For scheduling needs, including cancellations and rescheduling, please call [place], [phone].     Follow-Up: At [facility], you and your health needs are our priority.  As part of our continuing mission to provide you with exceptional heart care, we have created designated Provider Care Teams.  These Care Teams include your primary Cardiologist (physician) and Advanced Practice Providers (APPs -  Physician Assistants and Nurse Practitioners) who all work together to provide you with the care you need, when you need it.  We recommend signing up for the patient portal called "MyChart".  Sign up information is provided on this After Visit Summary.  MyChart is used to connect with patients for Virtual Visits (Telemedicine).  Patients are able to view lab/test results, encounter notes, upcoming  appointments, etc.  Non-urgent messages can be sent to your provider as well.   To learn more about what you can do with MyChart, go to [email or web address].    Your next appointment:   Follow up after testing   The format for  your next appointment:   In Person  Provider:   You may see Kate Sable, MD or one of the following Advanced Practice Providers on your designated Care Team:   Murray Hodgkins, NP Christell Faith, PA-C Cadence Kathlen Mody, [place]    Other Instructions   Important Information About Sugar         Signed, Kate Sable, MD  04/29/2022 3:31 PM    [place]

## 2022-05-01 DIAGNOSIS — R911 Solitary pulmonary nodule: Secondary | ICD-10-CM | POA: Insufficient documentation

## 2022-05-01 NOTE — Assessment & Plan Note (Signed)
Improved with icing.  Continue as is.

## 2022-05-01 NOTE — Assessment & Plan Note (Signed)
Imaging reviewed.  Has cardiology follow-up pending.  Will await cardiology note.

## 2022-05-01 NOTE — Assessment & Plan Note (Signed)
With levothyroxine recently adjusted.  Discussed.

## 2022-05-01 NOTE — Assessment & Plan Note (Signed)
With follow-up PET scan pending.  Discussed.  Reviewed recent CT.

## 2022-05-04 ENCOUNTER — Telehealth (HOSPITAL_COMMUNITY): Payer: Self-pay | Admitting: *Deleted

## 2022-05-04 NOTE — Telephone Encounter (Signed)
Reaching out to patient to offer assistance regarding upcoming cardiac imaging study; pt verbalizes understanding of appt date/time, parking situation and where to check in, pre-test NPO status and medications ordered, and verified current allergies; name and call back number provided for further questions should they arise  Karen Clement RN Navigator Cardiac Imaging [facility] [phone] office [phone] cell  Patient to take 100mg  metoprolol tartrate and 15mg  ivabradine two hours prior to her cardiac CT scan.

## 2022-05-05 ENCOUNTER — Ambulatory Visit
Admission: RE | Admit: 2022-05-05 | Discharge: 2022-05-05 | Disposition: A | Discharge status: Home / Self Care | Payer: PPO | Source: Ambulatory Visit | Attending: Cardiology | Admitting: Cardiology

## 2022-05-05 ENCOUNTER — Other Ambulatory Visit: Payer: Self-pay | Admitting: Cardiology

## 2022-05-05 DIAGNOSIS — R072 Precordial pain: Secondary | ICD-10-CM | POA: Diagnosis not present

## 2022-05-05 DIAGNOSIS — R931 Abnormal findings on diagnostic imaging of heart and coronary circulation: Secondary | ICD-10-CM

## 2022-05-05 MED ORDER — NITROGLYCERIN 0.4 MG SL SUBL
0.8000 mg | SUBLINGUAL_TABLET | Freq: Once | SUBLINGUAL | Status: AC
  Administered 2022-05-05: 0.8 mg via SUBLINGUAL
  Filled 2022-05-05: qty 25

## 2022-05-05 MED ORDER — NITROGLYCERIN 0.4 MG SL SUBL
SUBLINGUAL_TABLET | SUBLINGUAL | Status: AC
  Filled 2022-05-05: qty 2

## 2022-05-05 MED ORDER — IOHEXOL 350 MG/ML SOLN
75.0000 mL | Freq: Once | INTRAVENOUS | Status: AC | PRN
  Administered 2022-05-05: 75 mL via INTRAVENOUS

## 2022-05-05 NOTE — Progress Notes (Signed)
Patient tolerated CT well.  Vital signs stable encourage to drink water throughout day.Reasons explained and verbalized understanding.

## 2022-05-09 ENCOUNTER — Encounter: Admission: RE | Admit: 2022-05-09 | Payer: PPO | Source: Ambulatory Visit

## 2022-05-11 ENCOUNTER — Telehealth: Payer: Self-pay

## 2022-05-11 NOTE — Chronic Care Management (AMB) (Signed)
Chronic Care Management Pharmacy Assistant   Name: [name]  MRN: [id number] DOB: 06-21-1946  Reason for Encounter: General Adherence    Recent office visits:  05/27/22-Graham Duncan,MD(PCP)- routine f/u, try voltaren gel for upper back, finger warts removed in office, f/u 6 months 04/29/22-Graham Duncan,MD(PCP)-f/u reflux,awaiting PET scan, no medication changes  04/15/22-Graham Duncan,MD(PCP)-Hospital f/u-dc'd nortriptyline due to side effects,chest xray shows lung densities imaging scheduled.Try ice for jaw pain,f/u 2 weeks 03/07/22-Graham Duncan,MD(PCP)- UTI, UA with culture,start nitrofurantoin 100mg  take twice dialy  02/14/22-Graham Duncan,MD(PCP)- altered taste, EMT referral, no medication changes  01/31/22-Tabitha Dugal,FNP-weakness and metal taste in mouth,labs ordered,referral for vascular surgery, [place] ordered,stop omeprazole,start pantoprazole 40mg  take 1 daily,nystatin 10000 unit/ml sent in for thrush,f/u 2 weeks  Recent consult visits:  05/19/22-Brian Agbor-Etang,MD(cardio)-f/u -no medication changes f/u 1year. 05/25/22-Rohini Vanga,MD(gastro)-abdomen bloat, cleared for colonoscopy 04/29/22-Brian Agbor-Etang,MD(cardio)-F/U jaw pain,elevated BP-EKG, reviewed recent labs,no medication changes, referral for imaging. 04/28/22-Govinda Brahmanday,MD(onco)-f/u lung nodule,recommend PET scan,decrease synthroid to 42mcg daily,f/u 2-3 weeks 03/30/22-Zachary Potter,MD(neuro)-change in taste and smell,EEG,start nortriptyline 10mg  for HA, consider EMG/NCV f/u 2 months   02/28/22-Gregory Schnier,MD(vasc.surg)-painful varicose veins, start wearing compression stockings, elevate legs, ultrasound ordered, f/u 3 months  02/24/22-Bradley King(ophth)- no data found   [facility]:  [facility] Neck pain,HTN,Labs,EKG, chest xray,no medication changes,no admission   Medications: Outpatient Encounter Medications as of 05/11/2022  Medication Sig   Ascorbic Acid (VITAMIN C) 500  MG tablet Take 2,000 mg by mouth daily. Reported on 02/04/2016   aspirin EC 81 MG tablet Take 1 tablet (81 mg total) by mouth daily.   Biotin 1000 MCG tablet Take 1,000 mcg by mouth daily. Reported on 02/04/2016   Coenzyme Q10 200 MG capsule Take 100 mg by mouth daily.   levothyroxine (SYNTHROID) 75 MCG tablet Take 1 tablet (75 mcg total) by mouth daily before breakfast.   Lutein 20 MG CAPS Take 20 mg by mouth daily.   metoprolol tartrate (LOPRESSOR) 100 MG tablet Take 1 tablet (100 mg total) by mouth once for 1 dose. Take 2 hours prior to your CT scan.   pantoprazole (PROTONIX) 40 MG tablet Take 1 tablet (40 mg total) by mouth daily.   Potassium Gluconate 595 MG CAPS Take 595 mg by mouth daily.    pravastatin (PRAVACHOL) 40 MG tablet TAKE 1 TABLET BY MOUTH EVERY DAY   [DISCONTINUED] metoprolol succinate (TOPROL-XL) 25 MG 24 hr tablet Take 25 mg by mouth at bedtime.   No facility-administered encounter medications on file as of 05/11/2022.    Contacted Alexie Lanni on 05/29/22 for general disease state and medication adherence call.   Patient is not more than 5 days past due for refill on the following medications per chart history:  [facility] Medications: Medication Name/mg Last Fill Days Supply Pravastatin 40mg   03/24/22 90   What concerns do you have about your medications?no concerns at this time   The patient denies side effects with their medications.   How often do you forget or accidentally miss a dose? Never  Do you use a pillbox? Yes  Are you having any problems getting your medications from your pharmacy? No  Has the cost of your medications been a concern? No copays went up a little bit but still affordable for 90ds.  Since last visit with CPP, the following interventions have been made. Patient kept log of BP's for awhile but they have been in normal range lately and she stopped recording.  The patient has had an ED visit since last  contact. 04/11/22  urgent care for jaw pain    The patient denies problems with their health.   Patient denies concerns or questions for Charlene Brooke, PharmD at this time.   Counseled patient on:  [place] job taking medications, Importance of taking medication daily without missed doses, Benefits of adherence packaging or a pillbox, and Access to CCM team for any cost, medication or pharmacy concerns.   Care Gaps: Annual wellness visit in last year? Yes Most Recent BP reading:132/70  04/29/22   Upcoming appointments: PCP appointment on 05/27/22 , 07/12/22   Charlene Brooke, CPP notified  Avel Sensor, [place]  [phone]

## 2022-05-12 ENCOUNTER — Ambulatory Visit
Admission: RE | Admit: 2022-05-12 | Discharge: 2022-05-12 | Disposition: A | Discharge status: Home / Self Care | Payer: PPO | Source: Ambulatory Visit | Attending: Internal Medicine | Admitting: Internal Medicine

## 2022-05-12 ENCOUNTER — Inpatient Hospital Stay: Payer: PPO | Attending: Internal Medicine

## 2022-05-12 DIAGNOSIS — Z9049 Acquired absence of other specified parts of digestive tract: Secondary | ICD-10-CM | POA: Diagnosis not present

## 2022-05-12 DIAGNOSIS — Z85850 Personal history of malignant neoplasm of thyroid: Secondary | ICD-10-CM | POA: Insufficient documentation

## 2022-05-12 DIAGNOSIS — R918 Other nonspecific abnormal finding of lung field: Secondary | ICD-10-CM | POA: Diagnosis not present

## 2022-05-12 DIAGNOSIS — R911 Solitary pulmonary nodule: Secondary | ICD-10-CM | POA: Insufficient documentation

## 2022-05-12 DIAGNOSIS — Z79899 Other long term (current) drug therapy: Secondary | ICD-10-CM | POA: Insufficient documentation

## 2022-05-12 DIAGNOSIS — R590 Localized enlarged lymph nodes: Secondary | ICD-10-CM | POA: Diagnosis not present

## 2022-05-12 DIAGNOSIS — Z85118 Personal history of other malignant neoplasm of bronchus and lung: Secondary | ICD-10-CM | POA: Insufficient documentation

## 2022-05-12 DIAGNOSIS — D696 Thrombocytopenia, unspecified: Secondary | ICD-10-CM | POA: Insufficient documentation

## 2022-05-12 DIAGNOSIS — M8580 Other specified disorders of bone density and structure, unspecified site: Secondary | ICD-10-CM | POA: Insufficient documentation

## 2022-05-12 LAB — GLUCOSE, CAPILLARY: Glucose-Capillary: 88 mg/dL (ref 70–99)

## 2022-05-12 MED ORDER — FLUDEOXYGLUCOSE F - 18 (FDG) INJECTION
7.1000 | Freq: Once | INTRAVENOUS | Status: AC | PRN
  Administered 2022-05-12: 7.1 via INTRAVENOUS

## 2022-05-12 NOTE — Progress Notes (Signed)
Tumor Board Documentation  [name] was presented by Dr Rogue Bussing at our Tumor Board on 05/12/2022, which included representatives from medical oncology, pathology, radiology, surgical, pulmonology, research, navigation, radiation oncology, internal medicine, palliative care.  [name] currently presents as a current patient, for discussion with history of the following treatments: active survellience, surgical intervention(s) (Lobectomy).  Additionally, we reviewed previous medical and familial history, history of present illness, and recent lab results along with all available histopathologic and imaging studies. The tumor board considered available treatment options and made the following recommendations:   Await results of PET Scan  The following procedures/referrals were also placed: No orders of the defined types were placed in this encounter.   Clinical Trial Status: not discussed   Staging used:   AJCC Staging:       Group: Lung Nodule   National site-specific guidelines   were discussed with respect to the case.  Tumor board is a meeting of clinicians from various specialty areas who evaluate and discuss patients for whom a multidisciplinary approach is being considered. Final determinations in the plan of care are those of the provider(s). The responsibility for follow up of recommendations given during tumor board is that of the provider.   Today's extended care, comprehensive team conference, [name] was not present for the discussion and was not examined.   Multidisciplinary Tumor Board is a multidisciplinary case peer review process.  Decisions discussed in the Multidisciplinary Tumor Board reflect the opinions of the specialists present at the conference without having examined the patient.  Ultimately, treatment and diagnostic decisions rest with the primary provider(s) and the patient.

## 2022-05-13 ENCOUNTER — Other Ambulatory Visit: Payer: PPO

## 2022-05-13 DIAGNOSIS — R569 Unspecified convulsions: Secondary | ICD-10-CM | POA: Diagnosis not present

## 2022-05-16 ENCOUNTER — Encounter: Payer: Self-pay | Admitting: Internal Medicine

## 2022-05-16 ENCOUNTER — Inpatient Hospital Stay: Payer: PPO | Admitting: Internal Medicine

## 2022-05-16 DIAGNOSIS — Z85118 Personal history of other malignant neoplasm of bronchus and lung: Secondary | ICD-10-CM | POA: Diagnosis not present

## 2022-05-16 DIAGNOSIS — M8580 Other specified disorders of bone density and structure, unspecified site: Secondary | ICD-10-CM | POA: Diagnosis not present

## 2022-05-16 DIAGNOSIS — C3431 Malignant neoplasm of lower lobe, right bronchus or lung: Secondary | ICD-10-CM

## 2022-05-16 DIAGNOSIS — D696 Thrombocytopenia, unspecified: Secondary | ICD-10-CM | POA: Diagnosis not present

## 2022-05-16 DIAGNOSIS — Z79899 Other long term (current) drug therapy: Secondary | ICD-10-CM | POA: Diagnosis not present

## 2022-05-16 DIAGNOSIS — Z85850 Personal history of malignant neoplasm of thyroid: Secondary | ICD-10-CM | POA: Diagnosis not present

## 2022-05-16 NOTE — Progress Notes (Signed)
Has intermittent pain between shoulder blades.

## 2022-05-16 NOTE — Progress Notes (Signed)
[place] OFFICE PROGRESS NOTE  Patient Care Team: Tonia Ghent, MD as PCP - General (Family Medicine) Kate Sable, MD as PCP - Cardiology (Cardiology) Beverly Gust, MD as Consulting Physician (Otolaryngology) Nestor Lewandowsky, MD (Inactive) as Consulting Physician (Cardiothoracic Surgery) Cammie Sickle, MD as Consulting Physician (Internal Medicine) Birder Robson, MD as Consulting Physician (Ophthalmology) Clayburn Pert, MD as Consulting Physician (General Surgery) Charlton Haws, [facility] as Pharmacist (Pharmacist)   Cancer Staging  Thyroid cancer [facility]) Staging form: Thyroid - Papillary or Follicular (Under 45 years), AJCC 7th Edition - Clinical: Stage I (T1, N0, M0) - Signed by Forest Gleason, MD on 09/11/2015 Laterality: Right    Oncology History Overview Note  # NOV 2016- RLL [place] ca T1N0 [incidental; Dr.Simonds; [place]- s/p RLlobectomy; Dr.Oaks;Dec 2016]  # FEB 2017- 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 STAGE I; s/p Total Thyroidetcomy [Dr.Chapman];NO RAIU.  LOW RISK; but detectable thyroglobulin- on Synthroid  #Survivorship-pending  DIAGNOSIS: Lung cancer -stage I #thyroid cancer low risk  GOALS: Cure  CURRENT/MOST RECENT THERAPY: Surveillance    Thyroid cancer ([place])  Cancer of lower lobe of right lung ([place])  05/06/2016 Initial Diagnosis   Malignant neoplasm of lower lobe, right bronchus or lung ([place])    INTERVAL HISTORY: Accompanied by her husband.  Ambulating independently.   [name] 76 y.o.  female pleasant patient above history of stage I lung cancer and also stage I thyroid cancer on synthroid currently on surveillance is here for follow-up/review results of the PET scan.  Patient denies any fever or chills or cough.  Denies any nausea vomiting abdominal pain.  Denies any headaches.  Complains of chronic back pain not any worse.  Review of  Systems  Constitutional:  Positive for malaise/fatigue. Negative for chills, diaphoresis, fever and weight loss.  HENT:  Negative for nosebleeds and sore throat.   Eyes:  Negative for double vision.  Respiratory:  Negative for cough, hemoptysis, sputum production, shortness of breath and wheezing.   Cardiovascular:  Negative for chest pain, palpitations, orthopnea and leg swelling.  Gastrointestinal:  Negative for abdominal pain, blood in stool, constipation, diarrhea, heartburn, melena and vomiting.  Genitourinary:  Negative for dysuria, frequency and urgency.  Musculoskeletal:  Positive for back pain. Negative for joint pain.  Skin: Negative.  Negative for itching and rash.  Neurological:  Positive for tingling. Negative for dizziness, focal weakness, weakness and headaches.  Endo/Heme/Allergies:  Does not bruise/bleed easily.  Psychiatric/Behavioral:  Negative for depression. The patient is not nervous/anxious and does not have insomnia.       PAST MEDICAL HISTORY :  Past Medical History:  Diagnosis Date   Adenocarcinoma of right lung ([place]) 10/01/2015   GERD (gastroesophageal reflux disease)    GIB (gastrointestinal bleeding)    Groin pain    Along superior/laterl R inguinal canal.  Could be due to hernia or old scar tissue.  prev with CT done w/o alarming findings.  Will treat episodically.  Use tylenol #3 prn.     Hyperlipidemia    Lipoma of thigh    Left   Normal cardiac stress test 2014   PONV (postoperative nausea and vomiting)    nausea after hysterectomy, and 2 days after lung surgery 10/2015   Thyroid cancer ([place]) 12/2015   Thyroid nodule     PAST SURGICAL HISTORY :   Past Surgical History:  Procedure Laterality Date   ABDOMINAL HYSTERECTOMY     BREAST  EXCISIONAL BIOPSY Right 1977   BREAST MASS EXCISION  1977   benign   CARPAL TUNNEL RELEASE Right 1978   CATARACT EXTRACTION, BILATERAL Bilateral    CHOLECYSTECTOMY  11/23/2016   Procedure: LAPAROSCOPIC  CHOLECYSTECTOMY;  Surgeon: Clayburn Pert, MD;  Location: [facility];  Service: General;;   COLONOSCOPY  2009   COLONOSCOPY WITH PROPOFOL N/A 05/18/2019   Procedure: COLONOSCOPY WITH PROPOFOL;  Surgeon: Virgel Manifold, MD;  Location: [facility];  Service: Endoscopy;  Laterality: N/A;   COLONOSCOPY WITH PROPOFOL N/A 08/15/2019   Procedure: COLONOSCOPY WITH PROPOFOL;  Surgeon: Virgel Manifold, MD;  Location: [facility];  Service: Endoscopy;  Laterality: N/A;   DILATION AND CURETTAGE OF UTERUS  2001   ELECTROMAGNETIC NAVIGATION BROCHOSCOPY N/A 08/25/2015   Procedure: ELECTROMAGNETIC NAVIGATION BRONCHOSCOPY;  Surgeon: Flora Lipps, MD;  Location: [facility];  Service: Cardiopulmonary;  Laterality: N/A;   ENDOBRONCHIAL ULTRASOUND N/A 08/25/2015   Procedure: ENDOBRONCHIAL ULTRASOUND;  Surgeon: Flora Lipps, MD;  Location: [facility];  Service: Cardiopulmonary;  Laterality: N/A;   ESOPHAGOGASTRODUODENOSCOPY  2002, 11/15/07   Normal (Dr. Allyn Kenner)   ESOPHAGOGASTRODUODENOSCOPY (EGD) WITH PROPOFOL N/A 05/18/2019   Procedure: ESOPHAGOGASTRODUODENOSCOPY (EGD) WITH PROPOFOL;  Surgeon: Virgel Manifold, MD;  Location: [facility];  Service: Endoscopy;  Laterality: N/A;   ESOPHAGOGASTRODUODENOSCOPY (EGD) WITH PROPOFOL N/A 08/15/2019   Procedure: ESOPHAGOGASTRODUODENOSCOPY (EGD) WITH PROPOFOL;  Surgeon: Virgel Manifold, MD;  Location: [facility];  Service: Endoscopy;  Laterality: N/A;   FACIAL COSMETIC SURGERY  01/13/10   mini facelift   HERNIA REPAIR  1976   OOPHORECTOMY     THORACOTOMY/LOBECTOMY Right 10/27/2015   Procedure: THORACOTOMY/LOBECTOMY;  Surgeon: Nestor Lewandowsky, MD;  Location: [facility];  Service: Thoracic;  Laterality: Right;   THYROIDECTOMY N/A 12/29/2015   Procedure: THYROIDECTOMY;  Surgeon: Beverly Gust, MD;  Location: [facility];  Service: ENT;  Laterality: N/A;   TOTAL VAGINAL HYSTERECTOMY  12/30/08   for fibroid pain (Dr. Gertie Fey)    FAMILY HISTORY :   Family  History  Problem Relation Age of Onset   Transient ischemic attack Mother    Hypertension Mother    Stroke Mother        mini strokes   Hypertension Sister    Cancer Sister        ovarian   Hypertension Sister    Hypertension Sister    Hypertension Sister    Breast cancer Neg Hx    Colon cancer Neg Hx     SOCIAL HISTORY:   Social History   Tobacco Use   Smoking status: Never   Smokeless tobacco: Never  Vaping Use   Vaping Use: Never used  Substance Use Topics   Alcohol use: No    Alcohol/week: 0.0 standard drinks of alcohol   Drug use: No    ALLERGIES:  is allergic to atorvastatin, ciprofloxacin, epinephrine, metoprolol succinate, nortriptyline, simvastatin, and sulfa antibiotics.  MEDICATIONS:  Current Outpatient Medications  Medication Sig Dispense Refill   Ascorbic Acid (VITAMIN C) 500 MG tablet Take 2,000 mg by mouth daily. Reported on 02/04/2016     aspirin EC 81 MG tablet Take 1 tablet (81 mg total) by mouth daily.     Biotin 1000 MCG tablet Take 1,000 mcg by mouth daily. Reported on 02/04/2016     Coenzyme Q10 200 MG capsule Take 100 mg by mouth daily.     levothyroxine (SYNTHROID) 75 MCG tablet Take 1 tablet (75 mcg total) by mouth daily before breakfast. 30 tablet 3   Lutein  20 MG CAPS Take 20 mg by mouth daily.     pantoprazole (PROTONIX) 40 MG tablet Take 1 tablet (40 mg total) by mouth daily. 90 tablet 3   Potassium Gluconate 595 MG CAPS Take 595 mg by mouth daily.      pravastatin (PRAVACHOL) 40 MG tablet TAKE 1 TABLET BY MOUTH EVERY DAY 90 tablet 3   metoprolol tartrate (LOPRESSOR) 100 MG tablet Take 1 tablet (100 mg total) by mouth once for 1 dose. Take 2 hours prior to your CT scan. (Patient not taking: Reported on 05/16/2022) 1 tablet 0   No current facility-administered medications for this visit.    PHYSICAL EXAMINATION: ECOG PERFORMANCE STATUS: 1 - Symptomatic but completely ambulatory  BP (!) 143/81 (BP Location: Right Arm, Patient Position:  Sitting)   Pulse 73   Temp 97.6 F (36.4 C) (Tympanic)   Resp 16   Wt 127 lb 6.4 oz (57.8 kg)   SpO2 98%   BMI 21.87 kg/m   Filed Weights   05/16/22 1500  Weight: 127 lb 6.4 oz (57.8 kg)      Physical Exam HENT:     Head: Normocephalic and atraumatic.     Mouth/Throat:     Pharynx: No oropharyngeal exudate.  Eyes:     Pupils: Pupils are equal, round, and reactive to light.  Cardiovascular:     Rate and Rhythm: Normal rate and regular rhythm.  Pulmonary:     Effort: No respiratory distress.     Breath sounds: No wheezing.  Abdominal:     General: Bowel sounds are normal. There is no distension.     Palpations: Abdomen is soft. There is no mass.     Tenderness: There is no abdominal tenderness. There is no guarding or rebound.  Musculoskeletal:        General: No tenderness. Normal range of motion.     Cervical back: Normal range of motion and neck supple.  Skin:    General: Skin is warm.  Neurological:     Mental Status: She is alert and oriented to person, place, and time.  Psychiatric:        Mood and Affect: Affect normal.    LABORATORY DATA:  I have reviewed the data as listed    Component Value Date/Time   NA 135 04/20/2022 1515   NA 141 07/01/2013 0103   K 4.0 04/20/2022 1515   K 3.8 07/01/2013 0103   CL 101 04/20/2022 1515   CL 110 (H) 07/01/2013 0103   CO2 28 04/20/2022 1515   CO2 27 07/01/2013 0103   GLUCOSE 105 (H) 04/20/2022 1515   GLUCOSE 88 07/01/2013 0103   BUN 15 04/20/2022 1515   BUN 11 07/01/2013 0103   CREATININE 0.67 04/20/2022 1515   CREATININE 0.68 07/01/2013 0103   CALCIUM 8.8 (L) 04/20/2022 1515   CALCIUM 8.2 (L) 07/01/2013 0103   PROT 6.6 04/20/2022 1515   PROT 5.5 (L) 07/01/2013 0103   ALBUMIN 4.0 04/20/2022 1515   ALBUMIN 3.0 (L) 07/01/2013 0103   AST 18 04/20/2022 1515   AST 19 07/01/2013 0103   ALT 20 04/20/2022 1515   ALT 24 07/01/2013 0103   ALKPHOS 54 04/20/2022 1515   ALKPHOS 55 07/01/2013 0103   BILITOT 0.5  04/20/2022 1515   BILITOT 0.7 07/01/2013 0103   GFRNONAA >60 04/20/2022 1515   GFRNONAA >60 07/01/2013 0103   GFRAA >60 05/19/2019 0117   GFRAA >60 07/01/2013 0103    No results found for: "SPEP", "  UPEP"  Lab Results  Component Value Date   WBC 5.0 04/11/2022   NEUTROABS 3.5 04/11/2022   HGB 13.0 04/11/2022   HCT 40.6 04/11/2022   MCV 94.6 04/11/2022   PLT 138 (L) 04/11/2022      Chemistry      Component Value Date/Time   NA 135 04/20/2022 1515   NA 141 07/01/2013 0103   K 4.0 04/20/2022 1515   K 3.8 07/01/2013 0103   CL 101 04/20/2022 1515   CL 110 (H) 07/01/2013 0103   CO2 28 04/20/2022 1515   CO2 27 07/01/2013 0103   BUN 15 04/20/2022 1515   BUN 11 07/01/2013 0103   CREATININE 0.67 04/20/2022 1515   CREATININE 0.68 07/01/2013 0103      Component Value Date/Time   CALCIUM 8.8 (L) 04/20/2022 1515   CALCIUM 8.2 (L) 07/01/2013 0103   ALKPHOS 54 04/20/2022 1515   ALKPHOS 55 07/01/2013 0103   AST 18 04/20/2022 1515   AST 19 07/01/2013 0103   ALT 20 04/20/2022 1515   ALT 24 07/01/2013 0103   BILITOT 0.5 04/20/2022 1515   BILITOT 0.7 07/01/2013 0103       RADIOGRAPHIC STUDIES: I have personally reviewed the radiological images as listed and agreed with the findings in the report. No results found.   ASSESSMENT & PLAN:  Cancer of lower lobe of right lung ([place]) # RLL Lung ca; Stage I;  no adjuvant therapy. July 2022-- CT scan- STABLE; Status post right lower lobectomy. No evidence of recurrent or metastatic disease. STABLE.    # RUL-groundglass opacity JUNE 2023- CT scan:  Progressive enlargement of a ground-glass density right upper lobe nodule over the last 2.5 years, suspicious for low-grade adenocarcinoma.  Other smaller sub-centimeter faint foci of ground-glass density primarily in the right lung and to a lesser extent in the left upper lobe are stable and indeterminate.PET SCAN JULY 15th, 2023-  Ground-glass nodules of the right upper lobe demonstrates no  significant FDG uptake. Given progressive enlargement of nodule over multiple prior exams, finding is compatible with low-grade adenocarcinoma, recommend continued surveillance. 2. Mildly hypermetabolic subcentimeter mediastinal lymph nodes, likely reactive.  Recommend a follow-up CT scan in 6 months.  Ordered today.   # Thyroid cancer [FEB 2017] status post thyroidectomy low risk stage I; on Synthroid 88 mcg. FEB 2023-thyroid ultrasound no evidence of recurrence. JUNE 2023--TSH- 0.2 [ symptoms of fatigue/anxiety]- will decrease dose of synthroid- 48mcg.  Stable.  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  Monitor for now    # DISPOSITION: # follow up in 6 months-MD;]cbc/cmp/ thyroid profile/ Thyroglobulin/and thyroglobulin antibodies- 1 week prior];CT chest prior- - Dr.B   # I reviewed the blood work- with the patient in detail; also reviewed the imaging independently [as summarized above]; and with the patient in detail.     Orders Placed This Encounter  Procedures   CT CHEST W CONTRAST    Standing Status:   Future    Standing Expiration Date:   05/17/2023    Order Specific Question:   If indicated for the ordered procedure, I authorize the administration of contrast media per Radiology protocol    Answer:   Yes    Order Specific Question:   Preferred imaging location?    Answer:   [facility]   CBC with Differential/Platelet    Standing Status:   Future  Standing Expiration Date:   05/17/2023   Comprehensive metabolic panel    Standing Status:   Future    Standing Expiration Date:   05/17/2023   Thyroid Panel With TSH    Standing Status:   Future    Standing Expiration Date:   05/17/2023   TgAb+Thyroglobulin IMA or RIA    Standing Status:   Future    Standing Expiration Date:   05/17/2023   All questions were answered. The patient knows to call  the clinic with any problems, questions or concerns.      Cammie Sickle, MD 05/16/2022 9:24 PM

## 2022-05-16 NOTE — Assessment & Plan Note (Addendum)
#   RLL Lung ca; Stage I;  no adjuvant therapy. July 2022-- CT scan- STABLE; Status post right lower lobectomy. No evidence of recurrent or metastatic disease. STABLE.   # RUL-groundglass opacity JUNE 2023- CT scan:  Progressive enlargement of a ground-glass density right upper lobe nodule over the last 2.5 years, suspicious for low-grade adenocarcinoma.  Other smaller sub-centimeter faint foci of ground-glass density primarily in the right lung and to a lesser extent in the left upper lobe are stable and indeterminate.PET SCAN JULY 15th, 2023-  Ground-glass nodules of the right upper lobe demonstrates no significant FDG uptake. Given progressive enlargement of nodule over multiple prior exams, finding is compatible with low-grade adenocarcinoma, recommend continued surveillance. 2. Mildly hypermetabolic subcentimeter mediastinal lymph nodes, likely reactive.  Also reviewed at the tumor conference.  Recommend a follow-up CT scan in 6 months.  Ordered today.  # Thyroid cancer [FEB 2017] status post thyroidectomy low risk stage I; on Synthroid 88 mcg. FEB 2023-thyroid ultrasound no evidence of recurrence. JUNE 2023--TSH- 0.2 [ symptoms of fatigue/anxiety]- will decrease dose of synthroid- 51mcg.  Stable.  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  Monitor for now    # DISPOSITION: # follow up in 6 months-MD;]cbc/cmp/ thyroid profile/ Thyroglobulin/and thyroglobulin antibodies- 1 week prior];CT chest prior- - Dr.B   # I reviewed the blood work- with the patient in detail; also reviewed the imaging independently [as summarized above]; and with the patient in detail.

## 2022-05-19 ENCOUNTER — Telehealth: Payer: Self-pay | Admitting: Cardiology

## 2022-05-19 ENCOUNTER — Ambulatory Visit: Payer: PPO | Admitting: Cardiology

## 2022-05-19 ENCOUNTER — Encounter: Payer: Self-pay | Admitting: Cardiology

## 2022-05-19 VITALS — BP 138/76 | HR 85 | Ht 64.0 in | Wt 127.0 lb

## 2022-05-19 DIAGNOSIS — I2584 Coronary atherosclerosis due to calcified coronary lesion: Secondary | ICD-10-CM | POA: Diagnosis not present

## 2022-05-19 DIAGNOSIS — E7800 Pure hypercholesterolemia, unspecified: Secondary | ICD-10-CM

## 2022-05-19 DIAGNOSIS — I2510 Atherosclerotic heart disease of native coronary artery without angina pectoris: Secondary | ICD-10-CM | POA: Diagnosis not present

## 2022-05-19 NOTE — Telephone Encounter (Signed)
Called patient and informed her that I corrected her weight. It was also evident from a previous Dr. Vertis Hammond on 7/17 that she was 127lbs,  Patient was very grateful for the quick follow up.

## 2022-05-19 NOTE — Progress Notes (Signed)
Cardiology Office Note:    Date:  05/19/2022   ID:  [name], DOB [birth year], MRN [id number]  PCP:  Tonia Ghent, MD  Cardiologist:  Kate Sable, MD  Electrophysiologist:  None   Referring MD: Tonia Ghent, MD   Chief Complaint  Patient presents with   Other    F/u CT no complaints today. Meds reviewed verbally with pt.    History of Present Illness:    [name] is a 76 y.o. female with a hx of hyperlipidemia, coronary calcifications, pulmonary nodule, who presents for follow-up.   Previously seen due to symptoms of chest pain, coronary CTA was obtained to evaluate any obstructive disease.  She feels well, takes medications as prescribed, denies any symptoms or complaints today.  States being active, drawn at a local gymnasium.  Prior notes chest CT 04/2020 showed aortic atherosclerosis and also calcifications in the LAD.   Echocardiogram 12/2019 showed normal systolic and diastolic function, EF 60 to 65%. Lexiscan Myoview 12/2019, normal, no evidence for ischemia. History of statin intolerance/myalgias with simvastatin, Lipitor.  Able to tolerate Pravachol.   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  Could be due to hernia or old scar tissue.  prev with CT done w/o alarming findings.  Will treat episodically.  Use tylenol #3 prn.     Hyperlipidemia    Lipoma of thigh    Left   Normal cardiac stress test 2014   PONV (postoperative nausea and vomiting)    nausea after hysterectomy, and 2 days after lung surgery 10/2015   Thyroid cancer ([place]) 12/2015   Thyroid nodule     Past Surgical History:  Procedure Laterality Date   ABDOMINAL HYSTERECTOMY     BREAST EXCISIONAL BIOPSY Right 1977   BREAST MASS EXCISION  1977   benign   CARPAL TUNNEL RELEASE Right 1978   CATARACT  EXTRACTION, BILATERAL Bilateral    CHOLECYSTECTOMY  11/23/2016   Procedure: LAPAROSCOPIC CHOLECYSTECTOMY;  Surgeon: Clayburn Pert, MD;  Location: [facility];  Service: General;;   COLONOSCOPY  2009   COLONOSCOPY WITH PROPOFOL N/A 05/18/2019   Procedure: COLONOSCOPY WITH PROPOFOL;  Surgeon: Virgel Manifold, MD;  Location: [facility];  Service: Endoscopy;  Laterality: N/A;   COLONOSCOPY WITH PROPOFOL N/A 08/15/2019   Procedure: COLONOSCOPY WITH PROPOFOL;  Surgeon: Virgel Manifold, MD;  Location: [facility];  Service: Endoscopy;  Laterality: N/A;   DILATION AND CURETTAGE OF UTERUS  2001   ELECTROMAGNETIC NAVIGATION BROCHOSCOPY N/A 08/25/2015   Procedure: ELECTROMAGNETIC NAVIGATION BRONCHOSCOPY;  Surgeon: Flora Lipps, MD;  Location: [facility];  Service: Cardiopulmonary;  Laterality: N/A;   ENDOBRONCHIAL ULTRASOUND N/A 08/25/2015   Procedure: ENDOBRONCHIAL ULTRASOUND;  Surgeon: Flora Lipps, MD;  Location: [facility];  Service: Cardiopulmonary;  Laterality: N/A;   ESOPHAGOGASTRODUODENOSCOPY  2002, 11/15/07   Normal (Dr. Allyn Kenner)   ESOPHAGOGASTRODUODENOSCOPY (EGD) WITH PROPOFOL N/A 05/18/2019   Procedure: ESOPHAGOGASTRODUODENOSCOPY (EGD) WITH PROPOFOL;  Surgeon: Virgel Manifold, MD;  Location: [facility];  Service: Endoscopy;  Laterality: N/A;   ESOPHAGOGASTRODUODENOSCOPY (EGD) WITH PROPOFOL N/A 08/15/2019   Procedure: ESOPHAGOGASTRODUODENOSCOPY (EGD) WITH PROPOFOL;  Surgeon: Virgel Manifold, MD;  Location: [facility];  Service: Endoscopy;  Laterality: N/A;   FACIAL COSMETIC SURGERY  01/13/10   mini facelift   HERNIA REPAIR  1976   OOPHORECTOMY  THORACOTOMY/LOBECTOMY Right 10/27/2015   Procedure: THORACOTOMY/LOBECTOMY;  Surgeon: Nestor Lewandowsky, MD;  Location: [facility];  Service: Thoracic;  Laterality: Right;   THYROIDECTOMY N/A 12/29/2015   Procedure: THYROIDECTOMY;  Surgeon: Beverly Gust, MD;  Location: [facility];  Service: ENT;  Laterality: N/A;   TOTAL VAGINAL  HYSTERECTOMY  12/30/08   for fibroid pain (Dr. Gertie Fey)    Current Medications: Current Meds  Medication Sig   Ascorbic Acid (VITAMIN C) 500 MG tablet Take 2,000 mg by mouth daily. Reported on 02/04/2016   aspirin EC 81 MG tablet Take 1 tablet (81 mg total) by mouth daily.   Biotin 1000 MCG tablet Take 1,000 mcg by mouth daily. Reported on 02/04/2016   Coenzyme Q10 200 MG capsule Take 100 mg by mouth daily.   levothyroxine (SYNTHROID) 75 MCG tablet Take 1 tablet (75 mcg total) by mouth daily before breakfast.   Lutein 20 MG CAPS Take 20 mg by mouth daily.   pantoprazole (PROTONIX) 40 MG tablet Take 1 tablet (40 mg total) by mouth daily.   Potassium Gluconate 595 MG CAPS Take 595 mg by mouth daily.    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  He has short term memory loss.     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   Previously had an adopted son who died in 21-Jan-2020.   Social Determinants of Health   Financial Resource Strain: Low Risk  (12/13/2021)   Overall Financial Resource Strain (CARDIA)    Difficulty of Paying Living Expenses: Not hard at all  Food Insecurity: No Food  Insecurity (12/13/2021)   Hunger Vital Sign    Worried About Running Out of Food in the Last Year: Never true    Ran Out of Food in the Last Year: Never true  Transportation Needs: No Transportation Needs (06/10/2020)   PRAPARE - Hydrologist (Medical): No    Lack of Transportation (Non-Medical): No  Physical Activity: Sufficiently Active (06/10/2020)   Exercise Vital Sign    Days of Exercise per Week: 3 days    Minutes of Exercise per Session: 60 min  Stress: No Stress Concern Present (06/10/2020)   [place]    Feeling of Stress : Not at all  Social Connections: Not on file     Family History: The patient's family history includes Cancer in her sister; Hypertension in her mother, sister, sister, sister, and sister; Stroke in her mother; Transient ischemic attack in her mother. There is no history of Breast cancer or  Colon cancer.  ROS:   Please see the history of present illness.     All other systems reviewed and are negative.  EKGs/Labs/Other Studies Reviewed:    The following studies were reviewed today:   EKG:  EKG is  ordered today.  The ekg ordered today demonstrates normal sinus rhythm, normal ekg.  Recent Labs: 04/11/2022: Hemoglobin 13.0; Platelets 138 04/20/2022: ALT 20; BUN 15; Creatinine, Ser 0.67; Potassium 4.0; Sodium 135; TSH 0.215  Recent Lipid Panel    Component Value Date/Time   CHOL 161 06/25/2021 0901   CHOL 145 07/01/2013 0103   TRIG 140.0 06/25/2021 0901   TRIG 224 (H) 07/01/2013 0103   HDL 45.30 06/25/2021 0901   HDL 29 (L) 07/01/2013 0103   CHOLHDL 4 06/25/2021 0901   VLDL 28.0 06/25/2021 0901   VLDL 45 (H) 07/01/2013 0103   LDLCALC 88 06/25/2021 0901   LDLCALC 71 07/01/2013 0103   LDLDIRECT 83.8 03/11/2011 0823    Physical Exam:    VS:  BP 138/76 (BP Location: Left Arm, Patient Position: Sitting, Cuff Size: Normal)   Pulse 85   Ht 5\' 4"  (1.626 m)    Wt 177 lb (80.3 kg)   SpO2 98%   BMI 30.38 kg/m     Wt Readings from Last 3 Encounters:  05/19/22 177 lb (80.3 kg)  05/16/22 127 lb 6.4 oz (57.8 kg)  04/29/22 125 lb 4 oz (56.8 kg)     GEN:  Well nourished, well developed in no acute distress HEENT: Normal NECK: No JVD; No carotid bruits CARDIAC: RRR, no murmurs, rubs, gallops RESPIRATORY:  Clear to auscultation without rales, wheezing or rhonchi  ABDOMEN: Soft, non-tender, non-distended MUSCULOSKELETAL:  No edema; No deformity  SKIN: Warm and dry NEUROLOGIC:  Alert and oriented x 3 PSYCHIATRIC:  Normal affect   ASSESSMENT:    1. Coronary artery calcification   2. Pure hypercholesterolemia    PLAN:    In order of problems listed above:  Chest pain, coronary CT showed calcium score 19.1, minimal proximal LAD stenosis less than 25%.  Echo in 2021 was normal, EF 60 to 65%.  Continue aspirin, pravastatin 40 mg daily. Hyperlipidemia, cholesterol controlled, continue pravastatin.  Did not tolerate Lipitor and simvastatin in the past  Follow-up yearly.   Medication Adjustments/Labs and Tests Ordered: Current medicines are reviewed at length with the patient today.  Concerns regarding medicines are outlined above.  No orders of the defined types were placed in this encounter.  No orders of the defined types were placed in this encounter.   Patient Instructions  Medication Instructions:  Your physician recommends that you continue on your current medications as directed. Please refer to the Current Medication list given to you today.  *If you need a refill on your cardiac medications before your next appointment, please call your pharmacy*   Follow-Up: At [facility], you and your health needs are our priority.  As part of our continuing mission to provide you with exceptional heart care, we have created designated Provider Care Teams.  These Care Teams include your primary Cardiologist (physician) and Advanced  Practice Providers (APPs -  Physician Assistants and Nurse Practitioners) who all work together to provide you with the care you need, when you need it.  We recommend signing up for the patient portal called "MyChart".  Sign up information is provided on this After Visit Summary.  MyChart is used to connect with patients for Virtual Visits (Telemedicine).  Patients are able to view  lab/test results, encounter notes, upcoming appointments, etc.  Non-urgent messages can be sent to your provider as well.   To learn more about what you can do with MyChart, go to [email or web address].    Your next appointment:   1 year(s)  The format for your next appointment:   In Person  Provider:   Kate Sable, MD    Other Instructions   Important Information About Sugar         Signed, Kate Sable, MD  05/19/2022 12:23 PM    [place]

## 2022-05-19 NOTE — Telephone Encounter (Signed)
   Pt said, she is checking her AVS and her weight said 177 lbs and she weight this morning and she's 127 lbs. She wanted to know if that needs to be corrected because that's way off from her correct weight

## 2022-05-19 NOTE — Patient Instructions (Signed)
Medication Instructions:  Your physician recommends that you continue on your current medications as directed. Please refer to the Current Medication list given to you today.  *If you need a refill on your cardiac medications before your next appointment, please call your pharmacy*   Follow-Up: At [facility], you and your health needs are our priority.  As part of our continuing mission to provide you with exceptional heart care, we have created designated Provider Care Teams.  These Care Teams include your primary Cardiologist (physician) and Advanced Practice Providers (APPs -  Physician Assistants and Nurse Practitioners) who all work together to provide you with the care you need, when you need it.  We recommend signing up for the patient portal called "MyChart".  Sign up information is provided on this After Visit Summary.  MyChart is used to connect with patients for Virtual Visits (Telemedicine).  Patients are able to view lab/test results, encounter notes, upcoming appointments, etc.  Non-urgent messages can be sent to your provider as well.   To learn more about what you can do with MyChart, go to [email or web address].    Your next appointment:   1 year(s)  The format for your next appointment:   In Person  Provider:   Kate Sable, MD    Other Instructions   Important Information About Sugar

## 2022-05-21 ENCOUNTER — Other Ambulatory Visit: Payer: Self-pay | Admitting: Family

## 2022-05-21 DIAGNOSIS — K219 Gastro-esophageal reflux disease without esophagitis: Secondary | ICD-10-CM

## 2022-05-24 NOTE — Telephone Encounter (Signed)
I called patient and assured her that the weight has been corrected and when a provider is reviewing her chart they will see her correct weight entered.  Patient was grateful for the call back.

## 2022-05-24 NOTE — Telephone Encounter (Signed)
Patient wanted to know if her weight will also be corrected in her MyChart. She has two other Doctor's appointments this week and wants to make sure the weight error has been corrected

## 2022-05-24 NOTE — Telephone Encounter (Signed)
Sent. Thanks.   

## 2022-05-25 ENCOUNTER — Other Ambulatory Visit: Payer: Self-pay

## 2022-05-25 ENCOUNTER — Ambulatory Visit (INDEPENDENT_AMBULATORY_CARE_PROVIDER_SITE_OTHER): Payer: PPO | Admitting: Gastroenterology

## 2022-05-25 ENCOUNTER — Encounter: Payer: Self-pay | Admitting: Gastroenterology

## 2022-05-25 ENCOUNTER — Ambulatory Visit: Payer: PPO

## 2022-05-25 VITALS — BP 161/85 | HR 74 | Temp 97.9°F | Ht 64.0 in | Wt 127.0 lb

## 2022-05-25 DIAGNOSIS — Z86010 Personal history of colonic polyps: Secondary | ICD-10-CM | POA: Diagnosis not present

## 2022-05-25 DIAGNOSIS — R140 Abdominal distension (gaseous): Secondary | ICD-10-CM | POA: Diagnosis not present

## 2022-05-25 MED ORDER — NA SULFATE-K SULFATE-MG SULF 17.5-3.13-1.6 GM/177ML PO SOLN: 354.0000 mL | Freq: Once | ORAL | 0 refills | Status: AC

## 2022-05-25 NOTE — Progress Notes (Signed)
Cephas Darby, MD [address]  [place]  [place], [place] [address]  Main: [phone]  Fax: [phone]    Gastroenterology Consultation  Referring Provider:     Tonia Ghent, MD Primary Care Physician:  Tonia Ghent, MD Primary Gastroenterologist:  Dr. Bonna Gains Reason for Consultation: Change in bowel habits, abdominal bloating        HPI:   [name] is a 76 y.o. female referred by Dr. Tonia Ghent, MD  for consultation & management of change in bowel habits and abdominal bloating.  Patient reports that recently, she has noticed change in stool caliber, her stools are sometimes formed and sometimes extremely associated with intermittent abdominal bloating.  She also has history of reflux, switched from omeprazole to pantoprazole 40 mg daily.  Her weight has been stable.  She denies any abdominal pain, nausea or vomiting.  She also wanted to discuss about the colonoscopy given her history of colon polyps  NSAIDs: None  Antiplts/Anticoagulants/Anti thrombotics: None  GI Procedures:  Upper endoscopy and colonoscopy 08/15/2019 - Normal esophagus. - Erythematous mucosa in the antrum. Biopsied. - Multiple gastric polyps. Biopsied. - Normal duodenal bulb, second portion of the duodenum and examined duodenum. - Biopsies were obtained in the gastric body, at the incisura and in the gastric antrum.   - A tattoo was seen in the ascending colon. The tattoo site appeared normal. - One 4 mm polyp in the transverse colon, removed with a cold biopsy forceps. Resected and retrieved. - The examination was otherwise normal. - The rectum, sigmoid colon, descending colon, transverse colon, ascending colon and cecum are normal. - The distal rectum and anal verge are normal on retroflexion view.  DIAGNOSIS:  A. STOMACH; BIOPSIES:  - BENIGN ANTRAL AND OXYNTIC MUCOSA WITH A FEW SUPERFICIAL HEMORRHAGES IN  LAMINA PROPRIA.  - 1 ANTRAL FRAGMENT WITH REPARATIVE  CHANGES SUGGESTIVE OF HEALING  MUCOSAL INJURY.  - NEGATIVE FOR H. PYLORI, INTESTINAL METAPLASIA, DYSPLASIA AND  MALIGNANCY.   B. STOMACH, POLYP; BIOPSY:  - 2 FRAGMENTS OF BENIGN FUNDIC GLAND POLYP.  - NEGATIVE FOR DYSPLASIA AND MALIGNANCY.   C. COLON POLYP, TRANSVERSE; BIOPSY:  - TUBULAR ADENOMA (2 FRAGMENTS).  - NEGATIVE FOR HIGH GRADE DYSPLASIA AND MALIGNANCY.   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  Could be due to hernia or old scar tissue.  prev with CT done w/o alarming findings.  Will treat episodically.  Use tylenol #3 prn.     Hyperlipidemia    Lipoma of thigh    Left   Normal cardiac stress test 2014   PONV (postoperative nausea and vomiting)    nausea after hysterectomy, and 2 days after lung surgery 10/2015   Thyroid cancer ([place]) 12/2015   Thyroid nodule     Past Surgical History:  Procedure Laterality Date   ABDOMINAL HYSTERECTOMY     BREAST EXCISIONAL BIOPSY Right 1977   BREAST MASS EXCISION  1977   benign   CARPAL TUNNEL RELEASE Right 1978   CATARACT EXTRACTION, BILATERAL Bilateral    CHOLECYSTECTOMY  11/23/2016   Procedure: LAPAROSCOPIC CHOLECYSTECTOMY;  Surgeon: Clayburn Pert, MD;  Location: [facility];  Service: General;;   COLONOSCOPY  2009   COLONOSCOPY WITH PROPOFOL N/A 05/18/2019   Procedure: COLONOSCOPY WITH PROPOFOL;  Surgeon: Virgel Manifold,  MD;  Location: [facility];  Service: Endoscopy;  Laterality: N/A;   COLONOSCOPY WITH PROPOFOL N/A 08/15/2019   Procedure: COLONOSCOPY WITH PROPOFOL;  Surgeon: Virgel Manifold, MD;  Location: [facility];  Service: Endoscopy;  Laterality: N/A;   DILATION AND CURETTAGE OF UTERUS  2001   ELECTROMAGNETIC NAVIGATION BROCHOSCOPY N/A 08/25/2015   Procedure: ELECTROMAGNETIC NAVIGATION BRONCHOSCOPY;  Surgeon: Flora Lipps,  MD;  Location: [facility];  Service: Cardiopulmonary;  Laterality: N/A;   ENDOBRONCHIAL ULTRASOUND N/A 08/25/2015   Procedure: ENDOBRONCHIAL ULTRASOUND;  Surgeon: Flora Lipps, MD;  Location: [facility];  Service: Cardiopulmonary;  Laterality: N/A;   ESOPHAGOGASTRODUODENOSCOPY  2002, 11/15/07   Normal (Dr. Allyn Kenner)   ESOPHAGOGASTRODUODENOSCOPY (EGD) WITH PROPOFOL N/A 05/18/2019   Procedure: ESOPHAGOGASTRODUODENOSCOPY (EGD) WITH PROPOFOL;  Surgeon: Virgel Manifold, MD;  Location: [facility];  Service: Endoscopy;  Laterality: N/A;   ESOPHAGOGASTRODUODENOSCOPY (EGD) WITH PROPOFOL N/A 08/15/2019   Procedure: ESOPHAGOGASTRODUODENOSCOPY (EGD) WITH PROPOFOL;  Surgeon: Virgel Manifold, MD;  Location: [facility];  Service: Endoscopy;  Laterality: N/A;   FACIAL COSMETIC SURGERY  01/13/10   mini facelift   HERNIA REPAIR  1976   OOPHORECTOMY     THORACOTOMY/LOBECTOMY Right 10/27/2015   Procedure: THORACOTOMY/LOBECTOMY;  Surgeon: Nestor Lewandowsky, MD;  Location: [facility];  Service: Thoracic;  Laterality: Right;   THYROIDECTOMY N/A 12/29/2015   Procedure: THYROIDECTOMY;  Surgeon: Beverly Gust, MD;  Location: [facility];  Service: ENT;  Laterality: N/A;   TOTAL VAGINAL HYSTERECTOMY  12/30/08   for fibroid pain (Dr. Gertie Fey)     Current Outpatient Medications:    Ascorbic Acid (VITAMIN C) 500 MG tablet, Take 2,000 mg by mouth daily. Reported on 02/04/2016, Disp: , Rfl:    aspirin EC 81 MG tablet, Take 1 tablet (81 mg total) by mouth daily., Disp:  , Rfl:    Biotin 1000 MCG tablet, Take 1,000 mcg by mouth daily. Reported on 02/04/2016, Disp: , Rfl:    Coenzyme Q10 200 MG capsule, Take 100 mg by mouth daily., Disp: , Rfl:    levothyroxine (SYNTHROID) 75 MCG tablet, Take 1 tablet (75 mcg total) by mouth daily before breakfast., Disp: 30 tablet, Rfl: 3   Lutein 20 MG CAPS, Take 20 mg by mouth daily., Disp: , Rfl:    Na Sulfate-K Sulfate-Mg Sulf 17.5-3.13-1.6 GM/177ML SOLN, Take 354 mLs by mouth once for 1  dose., Disp: 354 mL, Rfl: 0   pantoprazole (PROTONIX) 40 MG tablet, TAKE 1 TABLET BY MOUTH EVERY DAY, Disp: 90 tablet, Rfl: 1   Potassium Gluconate 595 MG CAPS, Take 595 mg by mouth daily. , Disp: , Rfl:    pravastatin (PRAVACHOL) 40 MG tablet, TAKE 1 TABLET BY MOUTH EVERY DAY, Disp: 90 tablet, Rfl: 3   Family History  Problem Relation Age of Onset   Transient ischemic attack Mother    Hypertension Mother    Stroke Mother        mini strokes   Hypertension Sister    Cancer Sister        ovarian   Hypertension Sister    Hypertension Sister    Hypertension Sister    Breast cancer Neg Hx    Colon cancer Neg Hx      Social History   Tobacco Use   Smoking status: Never   Smokeless tobacco: Never  Vaping Use   Vaping Use: Never used  Substance Use Topics   Alcohol use: No    Alcohol/week: 0.0 standard drinks of alcohol   Drug use: No  Allergies as of 05/25/2022 - Review Complete 05/25/2022  Allergen Reaction Noted   Atorvastatin  07/20/2010   Ciprofloxacin  08/14/2007   Epinephrine  04/26/2012   Metoprolol succinate Swelling 03/16/2011   Nortriptyline  04/15/2022   Simvastatin Other (See Comments) 03/16/2011   Sulfa antibiotics Other (See Comments) 08/14/2007    Review of Systems:    All systems reviewed and negative except where noted in HPI.   Physical Exam:  BP (!) 161/85 (BP Location: Left Arm, Patient Position: Sitting, Cuff Size: Normal)   Pulse 74   Temp 97.9 F (36.6 C) (Oral)   Ht 5\' 4"  (1.626 m)   Wt 127 lb (57.6 kg)   BMI 21.80 kg/m  No LMP recorded. Patient has had a hysterectomy.  General:   Alert,  Well-developed, well-nourished, pleasant and cooperative in NAD Head:  Normocephalic and atraumatic. Eyes:  Sclera clear, no icterus.   Conjunctiva pink. Ears:  Normal auditory acuity. Nose:  No deformity, discharge, or lesions. Mouth:  No deformity or lesions,oropharynx pink & moist. Neck:  Supple; no masses or thyromegaly. Lungs:  Respirations  even and unlabored.  Clear throughout to auscultation.   No wheezes, crackles, or rhonchi. No acute distress. Heart:  Regular rate and rhythm; no murmurs, clicks, rubs, or gallops. Abdomen:  Normal bowel sounds. Soft, non-tender and non-distended without masses, hepatosplenomegaly or hernias noted.  No guarding or rebound tenderness.   Rectal: Not performed Msk:  Symmetrical without gross deformities. Good, equal movement & strength bilaterally. Pulses:  Normal pulses noted. Extremities:  No clubbing or edema.  No cyanosis. Neurologic:  Alert and oriented x3;  grossly normal neurologically. Skin:  Intact without significant lesions or rashes. No jaundice. Psych:  Alert and cooperative. Normal mood and affect.  Imaging Studies: Reviewed  Assessment and Plan:   Bettylee Feig is a 76 y.o. female with history of right lung cancer s/p lobectomy under active surveillance, thyroid cancer s/p thyroidectomy on Synthroid, chronic ITP, history of large tubular adenoma of the colon is seen in consultation for change in stool caliber and abdominal bloating  Proceed with colonoscopy given history of colon adenomas Check H. pylori IgG given that patient is on PPI, and treat if positive   Follow up as needed   Cephas Darby, MD

## 2022-05-26 LAB — H. PYLORI ANTIBODY, IGG: H. pylori, IgG AbS: 0.34 Index Value (ref 0.00–0.79)

## 2022-05-27 ENCOUNTER — Ambulatory Visit (INDEPENDENT_AMBULATORY_CARE_PROVIDER_SITE_OTHER): Payer: PPO | Admitting: Family Medicine

## 2022-05-27 ENCOUNTER — Ambulatory Visit: Payer: PPO | Admitting: Nurse Practitioner

## 2022-05-27 ENCOUNTER — Ambulatory Visit: Payer: PPO | Admitting: Internal Medicine

## 2022-05-27 ENCOUNTER — Encounter: Payer: Self-pay | Admitting: Family Medicine

## 2022-05-27 DIAGNOSIS — R911 Solitary pulmonary nodule: Secondary | ICD-10-CM

## 2022-05-27 DIAGNOSIS — M549 Dorsalgia, unspecified: Secondary | ICD-10-CM

## 2022-05-27 MED ORDER — DICLOFENAC SODIUM 1 % EX GEL: 2.0000 g | Freq: Four times a day (QID) | CUTANEOUS | Status: DC | PRN

## 2022-05-27 NOTE — Progress Notes (Unsigned)
Per cards: Chest pain, coronary CT showed calcium score 19.1, minimal proximal LAD stenosis less than 25%.  Echo in 2021 was normal, EF 60 to 65%.  Continue aspirin, pravastatin 40 mg daily.  Hyperlipidemia, cholesterol controlled, continue pravastatin.  Did not tolerate Lipitor and simvastatin in the past =======================  Per neuro- has f/u OV pending for 05/30/22.  She had EEG done. Awaiting results.   =======================   Per GI: Proceed with colonoscopy given history of colon adenomas Check H. pylori IgG given that patient is on PPI, and treat if positive =======================   Per hematology.  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 July 2022-- CT scan- STABLE; Status post right lower lobectomy. No evidence of recurrent or metastatic disease. STABLE.    # RUL-groundglass opacity JUNE 2023- CT scan:  Progressive enlargement of a ground-glass density right upper lobe nodule over the last 2.5 years, suspicious for low-grade adenocarcinoma.  Other smaller sub-centimeter faint foci of ground-glass density primarily in the right lung and to a lesser extent in the left upper lobe are stable and indeterminate.PET SCAN JULY 15th, 2023-  Ground-glass nodules of the right upper lobe demonstrates no significant FDG uptake. Given progressive enlargement of nodule over multiple prior exams, finding is compatible with low-grade adenocarcinoma, recommend continued surveillance. 2. Mildly hypermetabolic subcentimeter mediastinal lymph nodes, likely reactive.  Recommend a follow-up CT scan in 6 months.  Ordered today.    # Thyroid cancer [FEB 2017] status post thyroidectomy low risk stage I; on Synthroid 88 mcg. FEB 2023-thyroid ultrasound no evidence of recurrence. JUNE 2023--TSH- 0.2 [ symptoms of fatigue/anxiety]- will decrease dose of synthroid- 79mcg.  Stable.   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  Monitor for now    # DISPOSITION: # follow up in 6 months-MD;]cbc/cmp/ thyroid profile/ Thyroglobulin/and thyroglobulin antibodies- 1 week prior];CT chest prior- - Dr.B =======================  She had eye clinic f/u and requesting records today. See avs.    =======================  She is still having upper back pain, between the shoulder blades.  Not constant.  We talked about trying voltaren gel to avoid rebound HA from tylenol or nsaids.    She has B thenar aches but not the entire palm.  Intermittent.  No dorsal pain.  Icy hot helps.    Meds, vitals, and allergies reviewed.   ROS: Per HPI unless specifically indicated in ROS section   9 warts on the R 2nd and 3rd finger and 1 on the L 2nd finger.  Consent obtained and then frozen x3.

## 2022-05-27 NOTE — Patient Instructions (Addendum)
Please ask the front for a record release from Dr. Edison Pace from [facility].   Take care.  Glad to see you. Try voltaren gel as needed for your back.

## 2022-05-29 NOTE — Assessment & Plan Note (Signed)
Given her previous imaging I do not suspect ominous source for her back pain it would be reasonable to try Voltaren gel.

## 2022-05-29 NOTE — Assessment & Plan Note (Signed)
suspicious for low-grade adenocarcinoma, with observation per hematology.  Discussed.

## 2022-05-30 ENCOUNTER — Encounter: Payer: Self-pay | Admitting: Family Medicine

## 2022-05-30 DIAGNOSIS — R439 Unspecified disturbances of smell and taste: Secondary | ICD-10-CM | POA: Diagnosis not present

## 2022-05-30 DIAGNOSIS — R519 Headache, unspecified: Secondary | ICD-10-CM | POA: Diagnosis not present

## 2022-05-30 DIAGNOSIS — R442 Other hallucinations: Secondary | ICD-10-CM | POA: Diagnosis not present

## 2022-06-13 ENCOUNTER — Other Ambulatory Visit: Payer: PPO

## 2022-06-14 ENCOUNTER — Encounter: Payer: Self-pay | Admitting: Internal Medicine

## 2022-06-14 ENCOUNTER — Ambulatory Visit (INDEPENDENT_AMBULATORY_CARE_PROVIDER_SITE_OTHER): Payer: PPO | Admitting: Internal Medicine

## 2022-06-14 DIAGNOSIS — L089 Local infection of the skin and subcutaneous tissue, unspecified: Secondary | ICD-10-CM | POA: Diagnosis not present

## 2022-06-14 DIAGNOSIS — L723 Sebaceous cyst: Secondary | ICD-10-CM

## 2022-06-14 MED ORDER — CEPHALEXIN 500 MG PO CAPS: 500.0000 mg | ORAL_CAPSULE | Freq: Three times a day (TID) | ORAL | 1 refills | Status: DC

## 2022-06-14 NOTE — Assessment & Plan Note (Signed)
Discussed hot packs to hopefully induce drainage Cephalexin 500 tid x 5 days (with refill) Consider excision if recurs  Change to doxy if not improved in 48 hours

## 2022-06-14 NOTE — Progress Notes (Signed)
Subjective:    Patient ID: [name], female    DOB: 11/09/1945, 76 y.o.   MRN: [id number]  HPI Here to get a skin spot checked  Has had a knot under the skin (moveable)---along left flank There for 2-3 years Now it is red and tender--since last week  It does abut the side of her bra strap No trauma and area not hit  Now covering it Has tried alcohol and cortisone---neither helped Slight itching  Current Outpatient Medications on File Prior to Visit  Medication Sig Dispense Refill   Ascorbic Acid (VITAMIN C) 500 MG tablet Take 2,000 mg by mouth daily. Reported on 02/04/2016     aspirin EC 81 MG tablet Take 1 tablet (81 mg total) by mouth daily.     Biotin 1000 MCG tablet Take 1,000 mcg by mouth daily. Reported on 02/04/2016     Coenzyme Q10 200 MG capsule Take 100 mg by mouth daily.     diclofenac Sodium (VOLTAREN ARTHRITIS PAIN) 1 % GEL Apply 2 g topically 4 (four) times daily as needed.     levothyroxine (SYNTHROID) 75 MCG tablet Take 1 tablet (75 mcg total) by mouth daily before breakfast. 30 tablet 3   Lutein 20 MG CAPS Take 20 mg by mouth daily.     pantoprazole (PROTONIX) 40 MG tablet TAKE 1 TABLET BY MOUTH EVERY DAY 90 tablet 1   Potassium Gluconate 595 MG CAPS Take 595 mg by mouth daily.      pravastatin (PRAVACHOL) 40 MG tablet TAKE 1 TABLET BY MOUTH EVERY DAY 90 tablet 3   [DISCONTINUED] metoprolol succinate (TOPROL-XL) 25 MG 24 hr tablet Take 25 mg by mouth at bedtime.     No current facility-administered medications on file prior to visit.    Allergies  Allergen Reactions   Atorvastatin     REACTION: myalgias   Ciprofloxacin     REACTION: increase reflux   Epinephrine     Heart racing with dental injection   Metoprolol Succinate Swelling    Swelling and stiffness in hands and ankles   Nortriptyline     Intolerant but not allergy   Simvastatin Other (See Comments)    Muscle aches and stiffness   Sulfa Antibiotics Other (See Comments)    REACTION:  itching    Past Medical History:  Diagnosis Date   Adenocarcinoma of right lung ([place]) 10/01/2015   GERD (gastroesophageal reflux disease)    GIB (gastrointestinal bleeding)    Groin pain    Along superior/laterl R inguinal canal.  Could be due to hernia or old scar tissue.  prev with CT done w/o alarming findings.  Will treat episodically.  Use tylenol #3 prn.     Hyperlipidemia    Lipoma of thigh    Left   Normal cardiac stress test 2014   PONV (postoperative nausea and vomiting)    nausea after hysterectomy, and 2 days after lung surgery 10/2015   Thyroid cancer ([place]) 12/2015   Thyroid nodule     Past Surgical History:  Procedure Laterality Date   ABDOMINAL HYSTERECTOMY     BREAST EXCISIONAL BIOPSY Right 1977   BREAST MASS EXCISION  1977   benign   CARPAL TUNNEL RELEASE Right 1978   CATARACT EXTRACTION, BILATERAL Bilateral    CHOLECYSTECTOMY  11/23/2016   Procedure: LAPAROSCOPIC CHOLECYSTECTOMY;  Surgeon: Clayburn Pert, MD;  Location: [facility];  Service: General;;   COLONOSCOPY  2009   COLONOSCOPY WITH PROPOFOL N/A 05/18/2019   Procedure: COLONOSCOPY  WITH PROPOFOL;  Surgeon: Virgel Manifold, MD;  Location: [facility];  Service: Endoscopy;  Laterality: N/A;   COLONOSCOPY WITH PROPOFOL N/A 08/15/2019   Procedure: COLONOSCOPY WITH PROPOFOL;  Surgeon: Virgel Manifold, MD;  Location: [facility];  Service: Endoscopy;  Laterality: N/A;   DILATION AND CURETTAGE OF UTERUS  February 06, 2000   ELECTROMAGNETIC NAVIGATION BROCHOSCOPY N/A 08/25/2015   Procedure: ELECTROMAGNETIC NAVIGATION BRONCHOSCOPY;  Surgeon: Flora Lipps, MD;  Location: [facility];  Service: Cardiopulmonary;  Laterality: N/A;   ENDOBRONCHIAL ULTRASOUND N/A 08/25/2015   Procedure: ENDOBRONCHIAL ULTRASOUND;  Surgeon: Flora Lipps, MD;  Location: [facility];  Service: Cardiopulmonary;  Laterality: N/A;   ESOPHAGOGASTRODUODENOSCOPY  02/05/01, 11/15/07   Normal (Dr. Allyn Kenner)   ESOPHAGOGASTRODUODENOSCOPY (EGD) WITH PROPOFOL N/A  05/18/2019   Procedure: ESOPHAGOGASTRODUODENOSCOPY (EGD) WITH PROPOFOL;  Surgeon: Virgel Manifold, MD;  Location: [facility];  Service: Endoscopy;  Laterality: N/A;   ESOPHAGOGASTRODUODENOSCOPY (EGD) WITH PROPOFOL N/A 08/15/2019   Procedure: ESOPHAGOGASTRODUODENOSCOPY (EGD) WITH PROPOFOL;  Surgeon: Virgel Manifold, MD;  Location: [facility];  Service: Endoscopy;  Laterality: N/A;   FACIAL COSMETIC SURGERY  01/13/10   mini facelift   HERNIA REPAIR  1976   OOPHORECTOMY     THORACOTOMY/LOBECTOMY Right 10/27/2015   Procedure: THORACOTOMY/LOBECTOMY;  Surgeon: Nestor Lewandowsky, MD;  Location: [facility];  Service: Thoracic;  Laterality: Right;   THYROIDECTOMY N/A 12/29/2015   Procedure: THYROIDECTOMY;  Surgeon: Beverly Gust, MD;  Location: [facility];  Service: ENT;  Laterality: N/A;   TOTAL VAGINAL HYSTERECTOMY  12/30/08   for fibroid pain (Dr. 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  He has short term memory loss.     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   Previously had an adopted son who died in 02-06-20.   Social Determinants of Health   Financial Resource Strain: Low Risk  (12/13/2021)   Overall Financial Resource Strain (CARDIA)    Difficulty of Paying Living Expenses: Not hard at all  Food Insecurity: No Food Insecurity (12/13/2021)   Hunger Vital Sign    Worried About Running Out of Food in the Last Year: Never true    Ran Out of Food in the Last Year: Never true  Transportation Needs: No Transportation Needs (06/10/2020)  PRAPARE - Hydrologist (Medical): No    Lack of Transportation (Non-Medical): No  Physical Activity: Sufficiently Active (06/10/2020)   Exercise Vital Sign    Days of Exercise per Week: 3 days    Minutes of Exercise per Session: 60 min  Stress: No Stress Concern Present (06/10/2020)   [place]    Feeling of Stress : Not at all  Social Connections: Not on file  Intimate Partner Violence: Not At Risk (06/10/2020)   Humiliation, Afraid, Rape, and Kick questionnaire    Fear of Current or Ex-Partner: No    Emotionally Abused: No    Physically Abused: No    Sexually Abused: No   Review of Systems Has had slight stomach upset--last day or two No fever Not sick otherwise     Objective:   Physical Exam Constitutional:      Appearance: Normal appearance.  Skin:    Comments: ~2cm raised purplish lesion in left anterior axillary line (~T7) Freely moveable still Mild induration--?sl fluctuance Moderate tenderness and surrounding erythema  Neurological:     Mental Status: She is alert.            Assessment & Plan:

## 2022-06-15 ENCOUNTER — Ambulatory Visit: Payer: PPO

## 2022-06-23 ENCOUNTER — Telehealth: Payer: Self-pay

## 2022-06-23 ENCOUNTER — Other Ambulatory Visit: Payer: Self-pay | Admitting: Family Medicine

## 2022-06-23 DIAGNOSIS — E7800 Pure hypercholesterolemia, unspecified: Secondary | ICD-10-CM

## 2022-06-23 DIAGNOSIS — E559 Vitamin D deficiency, unspecified: Secondary | ICD-10-CM

## 2022-06-23 MED ORDER — ONDANSETRON HCL 4 MG PO TABS: 4.0000 mg | ORAL_TABLET | Freq: Three times a day (TID) | ORAL | 0 refills | Status: DC | PRN

## 2022-06-23 NOTE — Telephone Encounter (Signed)
Patient was calling because she gets nausea with the prep and wanted something for nausea. Sent in [place] for patient. Patient asked if she needed to stop the Asprin 81mg  informed patient no just not the morning of. She asked if she could take levothyroxine the morning of procedure informed patient yes but just with a sip of water.

## 2022-06-25 DIAGNOSIS — K561 Intussusception: Secondary | ICD-10-CM | POA: Diagnosis not present

## 2022-06-26 ENCOUNTER — Ambulatory Visit: Payer: Self-pay | Admitting: General Surgery

## 2022-06-26 ENCOUNTER — Emergency Department
Admission: EM | Admit: 2022-06-26 | Discharge: 2022-06-26 | Disposition: A | Discharge status: Home / Self Care | Payer: PPO | Source: Home / Self Care | Attending: Emergency Medicine | Admitting: Emergency Medicine

## 2022-06-26 ENCOUNTER — Emergency Department: Payer: PPO

## 2022-06-26 ENCOUNTER — Other Ambulatory Visit: Payer: Self-pay

## 2022-06-26 DIAGNOSIS — K219 Gastro-esophageal reflux disease without esophagitis: Secondary | ICD-10-CM | POA: Diagnosis present

## 2022-06-26 DIAGNOSIS — K561 Intussusception: Secondary | ICD-10-CM

## 2022-06-26 DIAGNOSIS — G4730 Sleep apnea, unspecified: Secondary | ICD-10-CM | POA: Diagnosis present

## 2022-06-26 DIAGNOSIS — Z85850 Personal history of malignant neoplasm of thyroid: Secondary | ICD-10-CM | POA: Diagnosis not present

## 2022-06-26 DIAGNOSIS — Z85118 Personal history of other malignant neoplasm of bronchus and lung: Secondary | ICD-10-CM | POA: Diagnosis not present

## 2022-06-26 DIAGNOSIS — Z882 Allergy status to sulfonamides status: Secondary | ICD-10-CM | POA: Diagnosis not present

## 2022-06-26 DIAGNOSIS — Z888 Allergy status to other drugs, medicaments and biological substances status: Secondary | ICD-10-CM | POA: Diagnosis not present

## 2022-06-26 DIAGNOSIS — Z90710 Acquired absence of both cervix and uterus: Secondary | ICD-10-CM | POA: Diagnosis not present

## 2022-06-26 DIAGNOSIS — R109 Unspecified abdominal pain: Secondary | ICD-10-CM | POA: Diagnosis not present

## 2022-06-26 DIAGNOSIS — Z8249 Family history of ischemic heart disease and other diseases of the circulatory system: Secondary | ICD-10-CM | POA: Diagnosis not present

## 2022-06-26 DIAGNOSIS — E785 Hyperlipidemia, unspecified: Secondary | ICD-10-CM | POA: Diagnosis present

## 2022-06-26 DIAGNOSIS — Z9989 Dependence on other enabling machines and devices: Secondary | ICD-10-CM | POA: Diagnosis not present

## 2022-06-26 DIAGNOSIS — R9431 Abnormal electrocardiogram [ECG] [EKG]: Secondary | ICD-10-CM | POA: Diagnosis not present

## 2022-06-26 DIAGNOSIS — I10 Essential (primary) hypertension: Secondary | ICD-10-CM | POA: Diagnosis present

## 2022-06-26 DIAGNOSIS — Z7982 Long term (current) use of aspirin: Secondary | ICD-10-CM | POA: Diagnosis not present

## 2022-06-26 DIAGNOSIS — Z79890 Hormone replacement therapy (postmenopausal): Secondary | ICD-10-CM | POA: Diagnosis not present

## 2022-06-26 DIAGNOSIS — D1339 Benign neoplasm of other parts of small intestine: Secondary | ICD-10-CM | POA: Diagnosis present

## 2022-06-26 DIAGNOSIS — Z823 Family history of stroke: Secondary | ICD-10-CM | POA: Diagnosis not present

## 2022-06-26 DIAGNOSIS — Z881 Allergy status to other antibiotic agents status: Secondary | ICD-10-CM | POA: Diagnosis not present

## 2022-06-26 DIAGNOSIS — E890 Postprocedural hypothyroidism: Secondary | ICD-10-CM | POA: Diagnosis present

## 2022-06-26 LAB — COMPREHENSIVE METABOLIC PANEL
ALT: 26 U/L (ref 0–44)
AST: 25 U/L (ref 15–41)
Albumin: 4.1 g/dL (ref 3.5–5.0)
Alkaline Phosphatase: 48 U/L (ref 38–126)
Anion gap: 8 (ref 5–15)
BUN: 15 mg/dL (ref 8–23)
CO2: 29 mmol/L (ref 22–32)
Calcium: 8.9 mg/dL (ref 8.9–10.3)
Chloride: 104 mmol/L (ref 98–111)
Creatinine, Ser: 0.7 mg/dL (ref 0.44–1.00)
GFR, Estimated: >60 mL/min (ref >60)
Glucose, Bld: 119 mg/dL — ABNORMAL HIGH (ref 70–99)
Potassium: 3.6 mmol/L (ref 3.5–5.1)
Sodium: 141 mmol/L (ref 135–145)
Total Bilirubin: 1.2 mg/dL (ref 0.3–1.2)
Total Protein: 6.8 g/dL (ref 6.5–8.1)

## 2022-06-26 LAB — URINALYSIS, ROUTINE W REFLEX MICROSCOPIC
Bilirubin Urine: NEGATIVE
Glucose, UA: NEGATIVE mg/dL
Hgb urine dipstick: NEGATIVE
Ketones, ur: NEGATIVE mg/dL
Leukocytes,Ua: NEGATIVE
Nitrite: NEGATIVE
Protein, ur: NEGATIVE mg/dL
Specific Gravity, Urine: 1.005 (ref 1.005–1.030)
pH: 8 (ref 5.0–8.0)

## 2022-06-26 LAB — CBC
HCT: 42.6 % (ref 36.0–46.0)
Hemoglobin: 13.7 g/dL (ref 12.0–15.0)
MCH: 30.6 pg (ref 26.0–34.0)
MCHC: 32.2 g/dL (ref 30.0–36.0)
MCV: 95.1 fL (ref 80.0–100.0)
Platelets: 133 10*3/uL — ABNORMAL LOW (ref 150–400)
RBC: 4.48 MIL/uL (ref 3.87–5.11)
RDW: 14.1 % (ref 11.5–15.5)
WBC: 5.6 10*3/uL (ref 4.0–10.5)
nRBC: 0 % (ref 0.0–0.2)

## 2022-06-26 LAB — TROPONIN I (HIGH SENSITIVITY)
Troponin I (High Sensitivity): 4 ng/L (ref <18)
Troponin I (High Sensitivity): 4 ng/L (ref <18)

## 2022-06-26 LAB — LIPASE, BLOOD: Lipase: 39 U/L (ref 11–51)

## 2022-06-26 MED ORDER — ONDANSETRON 4 MG PO TBDP
4.0000 mg | ORAL_TABLET | Freq: Once | ORAL | Status: AC
Start: 2022-06-26 — End: 2022-06-26
  Administered 2022-06-26: 4 mg via ORAL
  Filled 2022-06-26: qty 1

## 2022-06-26 MED ORDER — ONDANSETRON 4 MG PO TBDP: 4.0000 mg | ORAL_TABLET | Freq: Three times a day (TID) | ORAL | 0 refills | Status: AC | PRN

## 2022-06-26 MED ORDER — IOHEXOL 300 MG/ML  SOLN
100.0000 mL | Freq: Once | INTRAMUSCULAR | Status: AC | PRN
Start: 2022-06-26 — End: 2022-06-26
  Administered 2022-06-26: 100 mL via INTRAVENOUS

## 2022-06-26 NOTE — ED Triage Notes (Signed)
Pt reports waking at 0430 this morning with lower abd pain with associated nausea. Denies emesis, diarrhea, or fever. Pt reports scheduled for colonoscopy on Friday. Pt alert and oriented. Breathing unlabored speaking in full sentences.

## 2022-06-26 NOTE — ED Provider Triage Note (Signed)
Emergency Medicine Provider Triage Evaluation Note  [name] , a 76 y.o. female  was evaluated in triage.  Pt complains of woke up at 430AM cramping sensation. Happened yesterday but got better after gas x.   Review of Systems  Positive: + abd pain  Negative: Fever   Physical Exam  BP (!) 165/71 (BP Location: Left Arm)   Pulse 77   Temp 97.7 F (36.5 C) (Oral)   Resp 16   Ht 5\' 4"  (1.626 m)   Wt 56.7 kg   SpO2 98%   BMI 21.46 kg/m  Gen:   Awake, no distress   Resp:  Normal effort  MSK:   Moves extremities without difficulty  Other:  + abd pain  Medical Decision Making  Medically screening exam initiated at 7:46 AM.  Appropriate orders placed.  [name] was informed that the remainder of the evaluation will be completed by another provider, this initial triage assessment does not replace that evaluation, and the importance of remaining in the ED until their evaluation is complete.  CT/trop/ekg   Karen Glenaire, MD 06/26/22 [phone]

## 2022-06-26 NOTE — H&P (View-Only) (Signed)
SURGICAL HISTORY AND PHYSICAL NOTE   HISTORY OF PRESENT ILLNESS (HPI):  76 y.o. female presented to [facility] ED for evaluation of pain. Patient reports she had a twisting/crampy abdominal pain that woke her up at 430 this morning.  The pain started in the upper abdomen and radiated to the lower abdomen and pelvis.  The patient endorses associated nausea.  Denies vomiting.  She denies fever.  Elevating factor was Gas-X.  She cannot identify any aggravating factor.  At the ED she was found without leukocytosis.  No significant electrolyte abnormality.  She had a CT scan of the abdomen and pelvis that shows a long segment, 14 cm intussusception in the ileum.  There was no sign of bowel obstruction.  I personally evaluated the images.  I even compared the PET CT scan done a month ago and there was finding of intussusception at the moment as well.  Surgery is consulted by Dr. Jari Pigg in this context for evaluation and management of small bowel intussusception.  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  Could be due to hernia or old scar tissue.  prev with CT done w/o alarming findings.  Will treat episodically.  Use tylenol #3 prn.     Hyperlipidemia    Lipoma of thigh    Left   Normal cardiac stress test 2014   PONV (postoperative nausea and vomiting)    nausea after hysterectomy, and 2 days after lung surgery 10/2015   Thyroid cancer ([place]) 12/2015   Thyroid nodule      PAST SURGICAL HISTORY ([place]):  Past Surgical History:  Procedure Laterality Date   ABDOMINAL HYSTERECTOMY     BREAST EXCISIONAL BIOPSY Right [place]   benign   CARPAL TUNNEL RELEASE Right 1978   CATARACT EXTRACTION, BILATERAL Bilateral    CHOLECYSTECTOMY  11/23/2016   Procedure: LAPAROSCOPIC  CHOLECYSTECTOMY;  Surgeon: Clayburn Pert, MD;  Location: [facility] ORS;  Service: General;;   COLONOSCOPY  2009   COLONOSCOPY WITH PROPOFOL N/A 05/18/2019   Procedure: COLONOSCOPY WITH PROPOFOL;  Surgeon: Virgel Manifold, MD;  Location: [facility] ENDOSCOPY;  Service: Endoscopy;  Laterality: N/A;   COLONOSCOPY WITH PROPOFOL N/A 08/15/2019   Procedure: COLONOSCOPY WITH PROPOFOL;  Surgeon: Virgel Manifold, MD;  Location: [facility] ENDOSCOPY;  Service: Endoscopy;  Laterality: N/A;   DILATION AND CURETTAGE OF UTERUS  2001   ELECTROMAGNETIC NAVIGATION BROCHOSCOPY N/A 08/25/2015   Procedure: ELECTROMAGNETIC NAVIGATION BRONCHOSCOPY;  Surgeon: Flora Lipps, MD;  Location: [facility] ORS;  Service: Cardiopulmonary;  Laterality: N/A;   ENDOBRONCHIAL ULTRASOUND N/A 08/25/2015   Procedure: ENDOBRONCHIAL ULTRASOUND;  Surgeon: Flora Lipps, MD;  Location: [facility] ORS;  Service: Cardiopulmonary;  Laterality: N/A;   ESOPHAGOGASTRODUODENOSCOPY  2002, 11/15/07   Normal (Dr. Allyn Kenner)   ESOPHAGOGASTRODUODENOSCOPY (EGD) WITH PROPOFOL N/A 05/18/2019   Procedure: ESOPHAGOGASTRODUODENOSCOPY (EGD) WITH PROPOFOL;  Surgeon: Virgel Manifold, MD;  Location: [facility];  Service: Endoscopy;  Laterality: N/A;   ESOPHAGOGASTRODUODENOSCOPY (EGD) WITH PROPOFOL N/A 08/15/2019   Procedure: ESOPHAGOGASTRODUODENOSCOPY (EGD) WITH PROPOFOL;  Surgeon: Virgel Manifold, MD;  Location: [facility] END[facility];  Service: Endoscopy;  Laterality: N/A;   FACIAL COSMETIC SURGERY  01/13/10   mini facelift   HERNIA REPAIR  1976   OOPHORECTOMY     THORACOTOMY/LOBECTOMY Right 10/27/2015  Procedure: THORACOTOMY/LOBECTOMY;  Surgeon: Nestor Lewandowsky, MD;  Location: [facility] OR[facility];  Service: Thoracic;  Laterality: Right;   THYROIDECTOMY N/A 12/29/2015   Procedure: THYROIDECTOMY;  Surgeon: Beverly Gust, MD;  Location: [facility] ORS;  Service: ENT;  Laterality: N/A;   TOTAL VAGINAL HYSTERECTOMY  12/30/08   for fibroid pain (Dr. Gertie Fey)     MEDICATIONS:  Prior to  Admission medications   Medication Sig Start Date End Date Taking? Authorizing Provider  Ascorbic Acid (VITAMIN C) 500 MG tablet Take 2,000 mg by mouth daily. Reported on 02/04/2016    [provider]  aspirin EC 81 MG tablet Take 1 tablet (81 mg total) by mouth daily. 11/29/19   Tonia Ghent, MD  Biotin 1000 MCG tablet Take 1,000 mcg by mouth daily. Reported on 02/04/2016    [provider]  cephALEXin (KEFLEX) 500 MG capsule Take 1 capsule (500 mg total) by mouth 3 (three) times daily. 06/14/22   Venia Carbon, MD  Coenzyme Q10 200 MG capsule Take 100 mg by mouth daily.    [provider]  diclofenac Sodium (VOLTAREN ARTHRITIS PAIN) 1 % GEL Apply 2 g topically 4 (four) times daily as needed. 05/27/22   Tonia Ghent, MD  levothyroxine (SYNTHROID) 75 MCG tablet Take 1 tablet (75 mcg total) by mouth daily before breakfast. 04/28/22   Cammie Sickle, MD  Lutein 20 MG CAPS Take 20 mg by mouth daily.    [provider]  ondansetron (ZOFRAN) 4 MG tablet Take 1 tablet (4 mg total) by mouth every 8 (eight) hours as needed for nausea or vomiting. 06/23/22   Lin Landsman, MD  pantoprazole (PROTONIX) 40 MG tablet TAKE 1 TABLET BY MOUTH EVERY DAY 05/24/22   Tonia Ghent, MD  Potassium Gluconate 595 MG CAPS Take 595 mg by mouth daily.     [provider]  pravastatin (PRAVACHOL) 40 MG tablet TAKE 1 TABLET BY MOUTH EVERY DAY 03/29/22   Tonia Ghent, MD  metoprolol succinate (TOPROL-XL) 25 MG 24 hr tablet Take 25 mg by mouth at bedtime.  06/29/20  [provider]     ALLERGIES:  Allergies  Allergen Reactions   Atorvastatin     REACTION: myalgias   Ciprofloxacin     REACTION: increase reflux   Epinephrine     Heart racing with dental injection   Metoprolol Succinate Swelling    Swelling and stiffness in hands and ankles   Nortriptyline     Intolerant but not allergy   Simvastatin Other (See Comments)    Muscle aches and  stiffness   Sulfa Antibiotics Other (See Comments)    REACTION: itching     SOCIAL HISTORY:  Social History   Socioeconomic History   Marital status: Married    Spouse name: Not on file   Number of children: Not on file   Years of education: Not on file   Highest education level: Not on file  Occupational History   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  He has short term memory loss.     Retired from [employer] social [employer] office 01-11-2012.   Previously had an adopted son who died in Jan 11, 2020.   Social Determinants of Health   Financial Resource Strain: Low Risk  (12/13/2021)   Overall Financial Resource Strain (CARDIA)    Difficulty of Paying Living Expenses: Not hard at all  Food Insecurity: No Food Insecurity (12/13/2021)   Hunger Vital Sign    Worried About Running Out of Food in the Last Year: Never true    Ran Out of Food in the Last Year: Never true  Transportation Needs: No Transportation Needs (06/10/2020)   PRAPARE - Hydrologist (Medical): No    Lack of Transportation (Non-Medical): No  Physical Activity: Sufficiently Active (06/10/2020)   Exercise Vital Sign    Days of Exercise per Week: 3 days    Minutes of Exercise per Session: 60 min  Stress: No Stress Concern Present (06/10/2020)   [place]    Feeling of Stress : Not at all  Social Connections: Not on file  Intimate Partner Violence: Not At Risk (06/10/2020)   Humiliation, Afraid, Rape, and Kick questionnaire    Fear of Current or Ex-Partner: No     Emotionally Abused: No    Physically Abused: No    Sexually Abused: No      FAMILY HISTORY:  Family History  Problem Relation Age of Onset   Transient ischemic attack Mother    Hypertension Mother    Stroke Mother        mini strokes   Hypertension Sister    Cancer Sister        ovarian   Hypertension Sister    Hypertension Sister    Hypertension Sister    Breast cancer Neg Hx    Colon cancer Neg Hx      REVIEW OF SYSTEMS:  Constitutional: denies weight loss, fever, chills, or sweats  Eyes: denies any other vision changes, history of eye injury  ENT: denies sore throat, hearing problems  Respiratory: denies shortness of breath, wheezing  Cardiovascular: denies chest pain, palpitations  Gastrointestinal: abdominal pain, nausea and vomiting Genitourinary: denies burning with urination or urinary frequency Musculoskeletal: denies any other joint pains or cramps  Skin: denies any other rashes or skin discolorations  Neurological: denies any other headache, dizziness, weakness  Psychiatric: denies any other depression, anxiety   All other review of systems were negative   VITAL SIGNS:  Temp:  [97.7 F (36.5 C)] 97.7 F (36.5 C) (08/27 0603) Pulse Rate:  [77] 77 (08/27 0603) Resp:  [16] 16 (08/27 0603) BP: (165)/(71) 165/71 (08/27 0603) SpO2:  [98 %] 98 % (08/27 0603) Weight:  [56.7 kg] 56.7 kg (08/27 0603)     Height: 5\' 4"  (162.6 cm) Weight: 56.7 kg BMI (Calculated): 21.45   INTAKE/OUTPUT:  This shift: No intake/output data recorded.  Last 2 shifts: @IOLAST2SHIFTS @   PHYSICAL EXAM:  Constitutional:  -- Normal body habitus  -- Awake, alert, and oriented x3  Eyes:  -- Pupils equally round and reactive to light  -- No scleral icterus  Ear, nose, and throat:  -- No jugular venous distension  Pulmonary:  -- No crackles  -- Equal breath sounds bilaterally -- Breathing non-labored at rest Cardiovascular:  -- S1, S2 present  -- No pericardial  rubs Gastrointestinal:  -- Abdomen soft, nontender, non-distended, no guarding or rebound tenderness -- No abdominal masses  appreciated, pulsatile or otherwise  Musculoskeletal and Integumentary:  -- Wounds: None appreciated -- Extremities: B/L UE and LE FROM, hands and feet warm, no edema  Neurologic:  -- Motor function: intact and symmetric -- Sensation: intact and symmetric   Labs:     Latest Ref Rng & Units 06/26/2022    6:09 AM 04/11/2022   10:35 AM 01/31/2022   10:28 AM  CBC  WBC 4.0 - 10.5 K/uL 5.6  5.0  7.5   Hemoglobin 12.0 - 15.0 g/dL 13.7  13.0  12.9   Hematocrit 36.0 - 46.0 % 42.6  40.6  39.0   Platelets 150 - 400 K/uL 133  138  153.0       Latest Ref Rng & Units 06/26/2022    6:09 AM 04/20/2022    3:15 PM 04/11/2022   10:35 AM  CMP  Glucose 70 - 99 mg/dL 119  105  104   BUN 8 - 23 mg/dL 15  15  18    Creatinine 0.44 - 1.00 mg/dL 0.70  0.67  0.70   Sodium 135 - 145 mmol/L 141  135  139   Potassium 3.5 - 5.1 mmol/L 3.6  4.0  3.9   Chloride 98 - 111 mmol/L 104  101  104   CO2 22 - 32 mmol/L 29  28  30    Calcium 8.9 - 10.3 mg/dL 8.9  8.8  8.8   Total Protein 6.5 - 8.1 g/dL 6.8  6.6    Total Bilirubin 0.3 - 1.2 mg/dL 1.2  0.5    Alkaline Phos 38 - 126 U/L 48  54    AST 15 - 41 U/L 25  18    ALT 0 - 44 U/L 26  20      Imaging studies:  EXAM: CT ABDOMEN AND PELVIS WITH CONTRAST   TECHNIQUE: Multidetector CT imaging of the abdomen and pelvis was performed using the standard protocol following bolus administration of intravenous contrast.   RADIATION DOSE REDUCTION: This exam was performed according to the departmental dose-optimization program which includes automated exposure control, adjustment of the mA and/or kV according to patient size and/or use of iterative reconstruction technique.   CONTRAST:  133mL OMNIPAQUE IOHEXOL 300 MG/ML  SOLN   COMPARISON:  CT Chest, Abdomen, and Pelvis 06/07/2018   FINDINGS: Lower chest: Lung bases appear stable since  2019. No pericardial or pleural effusion.   Hepatobiliary: Chronically absent gallbladder. Liver appears stable since 2019 with unchanged tiny hypodense areas in the posterior right lobe which are benign.   Pancreas: Negative.   Spleen: Chronic borderline to mild splenomegaly, but splenic enhancement remains normal.   Adrenals/Urinary Tract: Negative.   Stomach/Bowel: Redundant but decompressed large bowel in the pelvis. Decompressed descending colon. Redundant transverse colon with mild retained stool. Right colon appears negative terminal ileum is decompressed. Appendix is not identified and could be diminutive or absent.   No dilated small bowel. However, there is a long segment (at least 14 cm) small bowel intussusception in the mid lower abdomen and extending into the pelvis. Distal lead point is on series 2, image 70 and coronal image 30. Proximal extent of the intussusception is on series 2, image 54 and coronal image 34. Upstream small bowel is fluid-filled, up to 2.7 cm diameter. But upstream of that the small-bowel quickly tapers. No regional mesenteric fluid or lymphadenopathy.   No free air. No free fluid identified. Stomach, duodenum, and proximal small bowel appear negative.   Vascular/Lymphatic: Mild Aortoiliac calcified  atherosclerosis. Major arterial structures in the abdomen and pelvis are patent. Portal venous system is patent. No lymphadenopathy identified.   Reproductive: Absent uterus. Diminutive or absent ovaries. Incidental pelvic phleboliths.   Other: No pelvic free fluid.   Musculoskeletal: Chronic lower lumbar spine degeneration. No acute or suspicious osseous lesion. Underlying capacious spinal canal.   IMPRESSION: 1. Positive for a long segment (roughly 14 cm) distal small bowel intussusception from the lower abdomen into the pelvis. This is upstream of the terminal ileum. No obvious mass at the distal lead point, but in this age group a  pathologic lead point would be most Common. No significant small bowel obstruction at this time. No bowel perforation or other complicating features. Recommend Surgery consultation.   2. No other acute finding in the abdomen or pelvis. No metastatic disease identified.   Electronically Signed: By: Genevie Ann M.D. On: 06/26/2022 09:34  Assessment/Plan:  76 y.o. female with small bowel intussusception, complicated by pertinent comorbidities including hypothyroidism, history of lung cancer.  Patient with CT scan findings consistent with small bowel intussusception.  The fact that this intussusception was also seen a month ago and not causing bowel obstruction seems to be a chronic intussusception.  I discussed with the patient the implication of having intussusception.  This is a long segment of 14 cm of small bowel intussuscepted.  Even though it is not causing an obstruction I recommended the patient to have surgical intervention for resection of these small bowel portion.  I discussed with patient the possibility of being a benign lesion of the small bowel causing the intussusception but malignancy needs to be ruled out as well.  At this moment the abdominal pain has improved and patient denies any nausea or vomiting.  When discussing about having small bowel resection today the patient was concerned about needing to coordinate care for his husband that cannot take care of by himself.  I discussed with patient that even though this is not an emergency that it does not need to be done today I recommended patient to have the surgery as soon as possible.  We discussed about discharging her if she tolerated liquids and stay on liquids until surgery day possibly on Tuesday.  I discussed with patient the surgery of small bowel resection with anastomosis.  Risk of surgery includes bleeding, infection, injury to other part of the intestine, anastomosis leak, bowel obstruction, among others.  The patient reported  she understood and agreed to coordinate surgery for Tuesday.  Arnold Long, MD

## 2022-06-26 NOTE — H&P (Signed)
SURGICAL HISTORY AND PHYSICAL NOTE   HISTORY OF PRESENT ILLNESS (HPI):  76 y.o. female presented to [facility] ED for evaluation of pain. Patient reports she had a twisting/crampy abdominal pain that woke her up at 430 this morning.  The pain started in the upper abdomen and radiated to the lower abdomen and pelvis.  The patient endorses associated nausea.  Denies vomiting.  She denies fever.  Elevating factor was Gas-X.  She cannot identify any aggravating factor.  At the ED she was found without leukocytosis.  No significant electrolyte abnormality.  She had a CT scan of the abdomen and pelvis that shows a long segment, 14 cm intussusception in the ileum.  There was no sign of bowel obstruction.  I personally evaluated the images.  I even compared the PET CT scan done a month ago and there was finding of intussusception at the moment as well.  Surgery is consulted by Dr. Jari Pigg in this context for evaluation and management of small bowel intussusception.  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  Could be due to hernia or old scar tissue.  prev with CT done w/o alarming findings.  Will treat episodically.  Use tylenol #3 prn.     Hyperlipidemia    Lipoma of thigh    Left   Normal cardiac stress test 2014   PONV (postoperative nausea and vomiting)    nausea after hysterectomy, and 2 days after lung surgery 10/2015   Thyroid cancer ([place]) 12/2015   Thyroid nodule      PAST SURGICAL HISTORY ([place]):  Past Surgical History:  Procedure Laterality Date   ABDOMINAL HYSTERECTOMY     BREAST EXCISIONAL BIOPSY Right [place]   benign   CARPAL TUNNEL RELEASE Right 1978   CATARACT EXTRACTION, BILATERAL Bilateral    CHOLECYSTECTOMY  11/23/2016   Procedure: LAPAROSCOPIC  CHOLECYSTECTOMY;  Surgeon: Clayburn Pert, MD;  Location: [facility] ORS;  Service: General;;   COLONOSCOPY  2009   COLONOSCOPY WITH PROPOFOL N/A 05/18/2019   Procedure: COLONOSCOPY WITH PROPOFOL;  Surgeon: Virgel Manifold, MD;  Location: [facility] ENDOSCOPY;  Service: Endoscopy;  Laterality: N/A;   COLONOSCOPY WITH PROPOFOL N/A 08/15/2019   Procedure: COLONOSCOPY WITH PROPOFOL;  Surgeon: Virgel Manifold, MD;  Location: [facility] ENDOSCOPY;  Service: Endoscopy;  Laterality: N/A;   DILATION AND CURETTAGE OF UTERUS  2001   ELECTROMAGNETIC NAVIGATION BROCHOSCOPY N/A 08/25/2015   Procedure: ELECTROMAGNETIC NAVIGATION BRONCHOSCOPY;  Surgeon: Flora Lipps, MD;  Location: [facility] ORS;  Service: Cardiopulmonary;  Laterality: N/A;   ENDOBRONCHIAL ULTRASOUND N/A 08/25/2015   Procedure: ENDOBRONCHIAL ULTRASOUND;  Surgeon: Flora Lipps, MD;  Location: [facility] ORS;  Service: Cardiopulmonary;  Laterality: N/A;   ESOPHAGOGASTRODUODENOSCOPY  2002, 11/15/07   Normal (Dr. Allyn Kenner)   ESOPHAGOGASTRODUODENOSCOPY (EGD) WITH PROPOFOL N/A 05/18/2019   Procedure: ESOPHAGOGASTRODUODENOSCOPY (EGD) WITH PROPOFOL;  Surgeon: Virgel Manifold, MD;  Location: [facility];  Service: Endoscopy;  Laterality: N/A;   ESOPHAGOGASTRODUODENOSCOPY (EGD) WITH PROPOFOL N/A 08/15/2019   Procedure: ESOPHAGOGASTRODUODENOSCOPY (EGD) WITH PROPOFOL;  Surgeon: Virgel Manifold, MD;  Location: [facility];  Service: Endoscopy;  Laterality: N/A;   FACIAL COSMETIC SURGERY  01/13/10   mini facelift   HERNIA REPAIR  1976   OOPHORECTOMY     THORACOTOMY/LOBECTOMY Right 10/27/2015  Procedure: THORACOTOMY/LOBECTOMY;  Surgeon: Nestor Lewandowsky, MD;  Location: [facility] OR[facility];  Service: Thoracic;  Laterality: Right;   THYROIDECTOMY N/A 12/29/2015   Procedure: THYROIDECTOMY;  Surgeon: Beverly Gust, MD;  Location: [facility] ORS;  Service: ENT;  Laterality: N/A;   TOTAL VAGINAL HYSTERECTOMY  12/30/08   for fibroid pain (Dr. Gertie Fey)     MEDICATIONS:  Prior to  Admission medications   Medication Sig Start Date End Date Taking? Authorizing Provider  Ascorbic Acid (VITAMIN C) 500 MG tablet Take 2,000 mg by mouth daily. Reported on 02/04/2016    [provider]  aspirin EC 81 MG tablet Take 1 tablet (81 mg total) by mouth daily. 11/29/19   Tonia Ghent, MD  Biotin 1000 MCG tablet Take 1,000 mcg by mouth daily. Reported on 02/04/2016    [provider]  cephALEXin (KEFLEX) 500 MG capsule Take 1 capsule (500 mg total) by mouth 3 (three) times daily. 06/14/22   Venia Carbon, MD  Coenzyme Q10 200 MG capsule Take 100 mg by mouth daily.    [provider]  diclofenac Sodium (VOLTAREN ARTHRITIS PAIN) 1 % GEL Apply 2 g topically 4 (four) times daily as needed. 05/27/22   Tonia Ghent, MD  levothyroxine (SYNTHROID) 75 MCG tablet Take 1 tablet (75 mcg total) by mouth daily before breakfast. 04/28/22   Cammie Sickle, MD  Lutein 20 MG CAPS Take 20 mg by mouth daily.    [provider]  ondansetron (ZOFRAN) 4 MG tablet Take 1 tablet (4 mg total) by mouth every 8 (eight) hours as needed for nausea or vomiting. 06/23/22   Lin Landsman, MD  pantoprazole (PROTONIX) 40 MG tablet TAKE 1 TABLET BY MOUTH EVERY DAY 05/24/22   Tonia Ghent, MD  Potassium Gluconate 595 MG CAPS Take 595 mg by mouth daily.     [provider]  pravastatin (PRAVACHOL) 40 MG tablet TAKE 1 TABLET BY MOUTH EVERY DAY 03/29/22   Tonia Ghent, MD  metoprolol succinate (TOPROL-XL) 25 MG 24 hr tablet Take 25 mg by mouth at bedtime.  06/29/20  [provider]     ALLERGIES:  Allergies  Allergen Reactions   Atorvastatin     REACTION: myalgias   Ciprofloxacin     REACTION: increase reflux   Epinephrine     Heart racing with dental injection   Metoprolol Succinate Swelling    Swelling and stiffness in hands and ankles   Nortriptyline     Intolerant but not allergy   Simvastatin Other (See Comments)    Muscle aches and  stiffness   Sulfa Antibiotics Other (See Comments)    REACTION: itching     SOCIAL HISTORY:  Social History   Socioeconomic History   Marital status: Married    Spouse name: Not on file   Number of children: Not on file   Years of education: Not on file   Highest education level: Not on file  Occupational History   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  He has short term memory loss.     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   Previously had an adopted son who died in 01-10-20.   Social Determinants of Health   Financial Resource Strain: Low Risk  (12/13/2021)   Overall Financial Resource Strain (CARDIA)    Difficulty of Paying Living Expenses: Not hard at all  Food Insecurity: No Food Insecurity (12/13/2021)   Hunger Vital Sign    Worried About Running Out of Food in the Last Year: Never true    Ran Out of Food in the Last Year: Never true  Transportation Needs: No Transportation Needs (06/10/2020)   PRAPARE - Hydrologist (Medical): No    Lack of Transportation (Non-Medical): No  Physical Activity: Sufficiently Active (06/10/2020)   Exercise Vital Sign    Days of Exercise per Week: 3 days    Minutes of Exercise per Session: 60 min  Stress: No Stress Concern Present (06/10/2020)   [place]    Feeling of Stress : Not at all  Social Connections: Not on file  Intimate Partner Violence: Not At Risk (06/10/2020)   Humiliation, Afraid, Rape, and Kick questionnaire    Fear of Current or Ex-Partner: No     Emotionally Abused: No    Physically Abused: No    Sexually Abused: No      FAMILY HISTORY:  Family History  Problem Relation Age of Onset   Transient ischemic attack Mother    Hypertension Mother    Stroke Mother        mini strokes   Hypertension Sister    Cancer Sister        ovarian   Hypertension Sister    Hypertension Sister    Hypertension Sister    Breast cancer Neg Hx    Colon cancer Neg Hx      REVIEW OF SYSTEMS:  Constitutional: denies weight loss, fever, chills, or sweats  Eyes: denies any other vision changes, history of eye injury  ENT: denies sore throat, hearing problems  Respiratory: denies shortness of breath, wheezing  Cardiovascular: denies chest pain, palpitations  Gastrointestinal: abdominal pain, nausea and vomiting Genitourinary: denies burning with urination or urinary frequency Musculoskeletal: denies any other joint pains or cramps  Skin: denies any other rashes or skin discolorations  Neurological: denies any other headache, dizziness, weakness  Psychiatric: denies any other depression, anxiety   All other review of systems were negative   VITAL SIGNS:  Temp:  [97.7 F (36.5 C)] 97.7 F (36.5 C) (08/27 0603) Pulse Rate:  [77] 77 (08/27 0603) Resp:  [16] 16 (08/27 0603) BP: (165)/(71) 165/71 (08/27 0603) SpO2:  [98 %] 98 % (08/27 0603) Weight:  [56.7 kg] 56.7 kg (08/27 0603)     Height: 5\' 4"  (162.6 cm) Weight: 56.7 kg BMI (Calculated): 21.45   INTAKE/OUTPUT:  This shift: No intake/output data recorded.  Last 2 shifts: @IOLAST2SHIFTS @   PHYSICAL EXAM:  Constitutional:  -- Normal body habitus  -- Awake, alert, and oriented x3  Eyes:  -- Pupils equally round and reactive to light  -- No scleral icterus  Ear, nose, and throat:  -- No jugular venous distension  Pulmonary:  -- No crackles  -- Equal breath sounds bilaterally -- Breathing non-labored at rest Cardiovascular:  -- S1, S2 present  -- No pericardial  rubs Gastrointestinal:  -- Abdomen soft, nontender, non-distended, no guarding or rebound tenderness -- No abdominal masses  appreciated, pulsatile or otherwise  Musculoskeletal and Integumentary:  -- Wounds: None appreciated -- Extremities: B/L UE and LE FROM, hands and feet warm, no edema  Neurologic:  -- Motor function: intact and symmetric -- Sensation: intact and symmetric   Labs:     Latest Ref Rng & Units 06/26/2022    6:09 AM 04/11/2022   10:35 AM 01/31/2022   10:28 AM  CBC  WBC 4.0 - 10.5 K/uL 5.6  5.0  7.5   Hemoglobin 12.0 - 15.0 g/dL 13.7  13.0  12.9   Hematocrit 36.0 - 46.0 % 42.6  40.6  39.0   Platelets 150 - 400 K/uL 133  138  153.0       Latest Ref Rng & Units 06/26/2022    6:09 AM 04/20/2022    3:15 PM 04/11/2022   10:35 AM  CMP  Glucose 70 - 99 mg/dL 119  105  104   BUN 8 - 23 mg/dL 15  15  18    Creatinine 0.44 - 1.00 mg/dL 0.70  0.67  0.70   Sodium 135 - 145 mmol/L 141  135  139   Potassium 3.5 - 5.1 mmol/L 3.6  4.0  3.9   Chloride 98 - 111 mmol/L 104  101  104   CO2 22 - 32 mmol/L 29  28  30    Calcium 8.9 - 10.3 mg/dL 8.9  8.8  8.8   Total Protein 6.5 - 8.1 g/dL 6.8  6.6    Total Bilirubin 0.3 - 1.2 mg/dL 1.2  0.5    Alkaline Phos 38 - 126 U/L 48  54    AST 15 - 41 U/L 25  18    ALT 0 - 44 U/L 26  20      Imaging studies:  EXAM: CT ABDOMEN AND PELVIS WITH CONTRAST   TECHNIQUE: Multidetector CT imaging of the abdomen and pelvis was performed using the standard protocol following bolus administration of intravenous contrast.   RADIATION DOSE REDUCTION: This exam was performed according to the departmental dose-optimization program which includes automated exposure control, adjustment of the mA and/or kV according to patient size and/or use of iterative reconstruction technique.   CONTRAST:  123mL OMNIPAQUE IOHEXOL 300 MG/ML  SOLN   COMPARISON:  CT Chest, Abdomen, and Pelvis 06/07/2018   FINDINGS: Lower chest: Lung bases appear stable since  2019. No pericardial or pleural effusion.   Hepatobiliary: Chronically absent gallbladder. Liver appears stable since 2019 with unchanged tiny hypodense areas in the posterior right lobe which are benign.   Pancreas: Negative.   Spleen: Chronic borderline to mild splenomegaly, but splenic enhancement remains normal.   Adrenals/Urinary Tract: Negative.   Stomach/Bowel: Redundant but decompressed large bowel in the pelvis. Decompressed descending colon. Redundant transverse colon with mild retained stool. Right colon appears negative terminal ileum is decompressed. Appendix is not identified and could be diminutive or absent.   No dilated small bowel. However, there is a long segment (at least 14 cm) small bowel intussusception in the mid lower abdomen and extending into the pelvis. Distal lead point is on series 2, image 70 and coronal image 30. Proximal extent of the intussusception is on series 2, image 54 and coronal image 34. Upstream small bowel is fluid-filled, up to 2.7 cm diameter. But upstream of that the small-bowel quickly tapers. No regional mesenteric fluid or lymphadenopathy.   No free air. No free fluid identified. Stomach, duodenum, and proximal small bowel appear negative.   Vascular/Lymphatic: Mild Aortoiliac calcified  atherosclerosis. Major arterial structures in the abdomen and pelvis are patent. Portal venous system is patent. No lymphadenopathy identified.   Reproductive: Absent uterus. Diminutive or absent ovaries. Incidental pelvic phleboliths.   Other: No pelvic free fluid.   Musculoskeletal: Chronic lower lumbar spine degeneration. No acute or suspicious osseous lesion. Underlying capacious spinal canal.   IMPRESSION: 1. Positive for a long segment (roughly 14 cm) distal small bowel intussusception from the lower abdomen into the pelvis. This is upstream of the terminal ileum. No obvious mass at the distal lead point, but in this age group a  pathologic lead point would be most Common. No significant small bowel obstruction at this time. No bowel perforation or other complicating features. Recommend Surgery consultation.   2. No other acute finding in the abdomen or pelvis. No metastatic disease identified.   Electronically Signed: By: Genevie Ann M.D. On: 06/26/2022 09:34  Assessment/Plan:  76 y.o. female with small bowel intussusception, complicated by pertinent comorbidities including hypothyroidism, history of lung cancer.  Patient with CT scan findings consistent with small bowel intussusception.  The fact that this intussusception was also seen a month ago and not causing bowel obstruction seems to be a chronic intussusception.  I discussed with the patient the implication of having intussusception.  This is a long segment of 14 cm of small bowel intussuscepted.  Even though it is not causing an obstruction I recommended the patient to have surgical intervention for resection of these small bowel portion.  I discussed with patient the possibility of being a benign lesion of the small bowel causing the intussusception but malignancy needs to be ruled out as well.  At this moment the abdominal pain has improved and patient denies any nausea or vomiting.  When discussing about having small bowel resection today the patient was concerned about needing to coordinate care for his husband that cannot take care of by himself.  I discussed with patient that even though this is not an emergency that it does not need to be done today I recommended patient to have the surgery as soon as possible.  We discussed about discharging her if she tolerated liquids and stay on liquids until surgery day possibly on Tuesday.  I discussed with patient the surgery of small bowel resection with anastomosis.  Risk of surgery includes bleeding, infection, injury to other part of the intestine, anastomosis leak, bowel obstruction, among others.  The patient reported  she understood and agreed to coordinate surgery for Tuesday.  Arnold Long, MD

## 2022-06-26 NOTE — ED Notes (Signed)
Dc ppw provided to pt. RX info and followup reviewed as applicable. Pt provides verbal consent for dc and declines VS at time of dc. Pt ambultory off unit to lobby.

## 2022-06-26 NOTE — ED Provider Notes (Signed)
[facility] Provider Note    Event Date/Time   First MD Initiated Contact with Patient 06/26/22 1038     (approximate)   History   Abdominal Pain   HPI  [name] is a 76 y.o. female who comes in for lower abdominal pain that started at 430 this morning with associated nausea.  Patient was  to have a colonoscopy on Friday. Reports having these intermittent episodes of nausea/upper abdominal pain now for a few days. No sob, chest pain.    Physical Exam   Triage Vital Signs: ED Triage Vitals  Enc Vitals Group     BP 06/26/22 0603 (!) 165/71     Pulse Rate 06/26/22 0603 77     Resp 06/26/22 0603 16     Temp 06/26/22 0603 97.7 F (36.5 C)     Temp Source 06/26/22 0603 Oral     SpO2 06/26/22 0603 98 %     Weight 06/26/22 0603 125 lb (56.7 kg)     Height 06/26/22 0603 5\' 4"  (1.626 m)     Head Circumference --      Peak Flow --      Pain Score 06/26/22 0605 10     Pain Loc --      Pain Edu? --      Excl. in [place]? --     Most recent vital signs: Vitals:   06/26/22 0603  BP: (!) 165/71  Pulse: 77  Resp: 16  Temp: 97.7 F (36.5 C)  SpO2: 98%     General: Awake, no distress.  CV:  Good peripheral perfusion.  Resp:  Normal effort.  Abd:  No distention.  Some reported upper abdominal tenderness but no rebound or guarding Other:     ED Results / Procedures / Treatments   Labs (all labs ordered are listed, but only abnormal results are displayed) Labs Reviewed  COMPREHENSIVE METABOLIC PANEL - Abnormal; Notable for the following components:      Result Value   Glucose, Bld 119 (*)    All other components within normal limits  CBC - Abnormal; Notable for the following components:   Platelets 133 (*)    All other components within normal limits  URINALYSIS, ROUTINE W REFLEX MICROSCOPIC - Abnormal; Notable for the following components:   Color, Urine STRAW (*)    APPearance CLEAR (*)    All other components within normal limits   LIPASE, BLOOD  TROPONIN I (HIGH SENSITIVITY)  TROPONIN I (HIGH SENSITIVITY)     EKG  My interpretation of EKG:  My interpretation is normal sinus rate of 72 without any ST elevation or T wave inversions, normal intervals  RADIOLOGY I have reviewed the CT personally and interpreted and patient has evidence of intussusception.   PROCEDURES:  Critical Care performed: No  Procedures   MEDICATIONS ORDERED IN ED: Medications  ondansetron (ZOFRAN-ODT) disintegrating tablet 4 mg (4 mg Oral Given 06/26/22 0612)  iohexol (OMNIPAQUE) 300 MG/ML solution 100 mL (100 mLs Intravenous Contrast Given 06/26/22 0909)     IMPRESSION / MDM / ASSESSMENT AND PLAN / ED COURSE  I reviewed the triage vital signs and the nursing notes.   Patient's presentation is most consistent with acute presentation with potential threat to life or bodily function.   \Differential SBO, appendicitis, intusception ACS.  Patient reports symptoms are much better after the Zofran.  We will proceed with CT imaging given she is already had her gallbladder removed.   UA without evidence  of UTI.  Troponins is negative.  Lipase is normal CMP reassuring   CT  1. Positive for a long segment (roughly 14 cm) distal small bowel intussusception from the lower abdomen into the pelvis. This is upstream of the terminal ileum. No obvious mass at the distal lead point, but in this age group a pathologic lead point would be most Common. No significant small bowel obstruction at this time. No bowel perforation or other complicating features. Recommend Surgery consultation.   2. No other acute finding in the abdomen or pelvis. No metastatic disease identified.   Dr. Peyton Najjar is at bedside to evaluate patient  Pt does not want to be admitted due to social reasons needs to arrange care of husband first. Given no obstructions and symptoms much better now cintron plans for outpt treatment on Tuesday of it.   Patient is  tolerating p.o. with no significant nausea.  Patient is requesting discharge home at this time.  She will return if symptoms are worsening otherwise follow-up with outpatient surgery on Tuesday  Considered admission but given patient is tolerating p.o. after discussion with surgery patient elects to want to do this outpatient given no evidence of bowel obstruction      FINAL CLINICAL IMPRESSION(S) / ED DIAGNOSES   Final diagnoses:  Intussusception intestine ([place])     Rx / DC Orders   ED Discharge Orders     None        Note:  This document was prepared using Dragon voice recognition software and may include unintentional dictation errors.   Vanessa Galeton, MD 06/26/22 [phone]

## 2022-06-26 NOTE — Discharge Instructions (Addendum)
  We discussed admission for treatment of the below condition but given your social situation you are to follow-up outpatient with surgery and have it done.  Return to the ER if you develop worsening symptoms or any other concerns     The distal small bowel intussusception was also present on 05/12/2022, and does not appear significantly changed in configuration since that time.

## 2022-06-26 NOTE — Consult Note (Signed)
SURGICAL CONSULTATION NOTE   HISTORY OF PRESENT ILLNESS (HPI):  76 y.o. female presented to [facility] ED for evaluation of pain. Patient reports she had a twisting/crampy abdominal pain that woke her up at 430 this morning.  The pain started in the upper abdomen and radiated to the lower abdomen and pelvis.  The patient endorses associated nausea.  Denies vomiting.  She denies fever.  Elevating factor was Gas-X.  She cannot identify any aggravating factor.  At the ED she was found without leukocytosis.  No significant electrolyte abnormality.  She had a CT scan of the abdomen and pelvis that shows a long segment, 14 cm intussusception in the ileum.  There was no sign of bowel obstruction.  I personally evaluated the images.  I even compared the PET CT scan done a month ago and there was finding of intussusception at the moment as well.  Surgery is consulted by Dr. Jari Pigg in this context for evaluation and management of small bowel intussusception.  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  Could be due to hernia or old scar tissue.  prev with CT done w/o alarming findings.  Will treat episodically.  Use tylenol #3 prn.     Hyperlipidemia    Lipoma of thigh    Left   Normal cardiac stress test 2014   PONV (postoperative nausea and vomiting)    nausea after hysterectomy, and 2 days after lung surgery 10/2015   Thyroid cancer ([place]) 12/2015   Thyroid nodule      PAST SURGICAL HISTORY ([place]):  Past Surgical History:  Procedure Laterality Date   ABDOMINAL HYSTERECTOMY     BREAST EXCISIONAL BIOPSY Right [place]   benign   CARPAL TUNNEL RELEASE Right 1978   CATARACT EXTRACTION, BILATERAL Bilateral    CHOLECYSTECTOMY  11/23/2016   Procedure: LAPAROSCOPIC  CHOLECYSTECTOMY;  Surgeon: Clayburn Pert, MD;  Location: [facility];  Service: General;;   COLONOSCOPY  2009   COLONOSCOPY WITH PROPOFOL N/A 05/18/2019   Procedure: COLONOSCOPY WITH PROPOFOL;  Surgeon: Virgel Manifold, MD;  Location: [facility];  Service: Endoscopy;  Laterality: N/A;   COLONOSCOPY WITH PROPOFOL N/A 08/15/2019   Procedure: COLONOSCOPY WITH PROPOFOL;  Surgeon: Virgel Manifold, MD;  Location: [facility];  Service: Endoscopy;  Laterality: N/A;   DILATION AND CURETTAGE OF UTERUS  2001   ELECTROMAGNETIC NAVIGATION BROCHOSCOPY N/A 08/25/2015   Procedure: ELECTROMAGNETIC NAVIGATION BRONCHOSCOPY;  Surgeon: Flora Lipps, MD;  Location: [facility];  Service: Cardiopulmonary;  Laterality: N/A;   ENDOBRONCHIAL ULTRASOUND N/A 08/25/2015   Procedure: ENDOBRONCHIAL ULTRASOUND;  Surgeon: Flora Lipps, MD;  Location: [facility];  Service: Cardiopulmonary;  Laterality: N/A;   ESOPHAGOGASTRODUODENOSCOPY  2002, 11/15/07   Normal (Dr. Allyn Kenner)   ESOPHAGOGASTRODUODENOSCOPY (EGD) WITH PROPOFOL N/A 05/18/2019   Procedure: ESOPHAGOGASTRODUODENOSCOPY (EGD) WITH PROPOFOL;  Surgeon: Virgel Manifold, MD;  Location: [facility];  Service: Endoscopy;  Laterality: N/A;   ESOPHAGOGASTRODUODENOSCOPY (EGD) WITH PROPOFOL N/A 08/15/2019   Procedure: ESOPHAGOGASTRODUODENOSCOPY (EGD) WITH PROPOFOL;  Surgeon: Virgel Manifold, MD;  Location: [facility];  Service: Endoscopy;  Laterality: N/A;   FACIAL COSMETIC SURGERY  01/13/10   mini facelift   HERNIA REPAIR  1976   OOPHORECTOMY     THORACOTOMY/LOBECTOMY Right 10/27/2015   Procedure:  THORACOTOMY/LOBECTOMY;  Surgeon: Nestor Lewandowsky, MD;  Location: [facility];  Service: Thoracic;  Laterality: Right;   THYROIDECTOMY N/A 12/29/2015   Procedure: THYROIDECTOMY;  Surgeon: Beverly Gust, MD;  Location: [facility];  Service: ENT;  Laterality: N/A;   TOTAL VAGINAL HYSTERECTOMY  12/30/08   for fibroid pain (Dr. Gertie Fey)     MEDICATIONS:  Prior to  Admission medications   Medication Sig Start Date End Date Taking? Authorizing Provider  Ascorbic Acid (VITAMIN C) 500 MG tablet Take 2,000 mg by mouth daily. Reported on 02/04/2016    [provider]  aspirin EC 81 MG tablet Take 1 tablet (81 mg total) by mouth daily. 11/29/19   Tonia Ghent, MD  Biotin 1000 MCG tablet Take 1,000 mcg by mouth daily. Reported on 02/04/2016    [provider]  cephALEXin (KEFLEX) 500 MG capsule Take 1 capsule (500 mg total) by mouth 3 (three) times daily. 06/14/22   Venia Carbon, MD  Coenzyme Q10 200 MG capsule Take 100 mg by mouth daily.    [provider]  diclofenac Sodium (VOLTAREN ARTHRITIS PAIN) 1 % GEL Apply 2 g topically 4 (four) times daily as needed. 05/27/22   Tonia Ghent, MD  levothyroxine (SYNTHROID) 75 MCG tablet Take 1 tablet (75 mcg total) by mouth daily before breakfast. 04/28/22   Cammie Sickle, MD  Lutein 20 MG CAPS Take 20 mg by mouth daily.    [provider]  ondansetron (ZOFRAN) 4 MG tablet Take 1 tablet (4 mg total) by mouth every 8 (eight) hours as needed for nausea or vomiting. 06/23/22   Lin Landsman, MD  pantoprazole (PROTONIX) 40 MG tablet TAKE 1 TABLET BY MOUTH EVERY DAY 05/24/22   Tonia Ghent, MD  Potassium Gluconate 595 MG CAPS Take 595 mg by mouth daily.     [provider]  pravastatin (PRAVACHOL) 40 MG tablet TAKE 1 TABLET BY MOUTH EVERY DAY 03/29/22   Tonia Ghent, MD  metoprolol succinate (TOPROL-XL) 25 MG 24 hr tablet Take 25 mg by mouth at bedtime.  06/29/20  [provider]     ALLERGIES:  Allergies  Allergen Reactions   Atorvastatin     REACTION: myalgias   Ciprofloxacin     REACTION: increase reflux   Epinephrine     Heart racing with dental injection   Metoprolol Succinate Swelling    Swelling and stiffness in hands and ankles   Nortriptyline     Intolerant but not allergy   Simvastatin Other (See Comments)    Muscle aches and  stiffness   Sulfa Antibiotics Other (See Comments)    REACTION: itching     SOCIAL HISTORY:  Social History   Socioeconomic History   Marital status: Married    Spouse name: Not on file   Number of children: Not on file   Years of education: Not on file   Highest education level: Not on file  Occupational History   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  He has short term memory loss.     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   Previously had an adopted son who died in January 18, 2020.   Social Determinants of Health   Financial Resource Strain: Low Risk  (12/13/2021)   Overall Financial Resource Strain (CARDIA)    Difficulty of Paying Living Expenses: Not hard at all  Food Insecurity: No Food Insecurity (12/13/2021)   Hunger Vital Sign    Worried About Running Out of Food in the Last Year: Never true    Ran Out of Food in the Last Year: Never true  Transportation Needs: No Transportation Needs (06/10/2020)   PRAPARE - Hydrologist (Medical): No    Lack of Transportation (Non-Medical): No  Physical Activity: Sufficiently Active (06/10/2020)   Exercise Vital Sign    Days of Exercise per Week: 3 days    Minutes of Exercise per Session: 60 min  Stress: No Stress Concern Present (06/10/2020)   [place]    Feeling of Stress : Not at all  Social Connections: Not on file  Intimate Partner Violence: Not At Risk (06/10/2020)   Humiliation, Afraid, Rape, and Kick questionnaire    Fear of Current or Ex-Partner: No     Emotionally Abused: No    Physically Abused: No    Sexually Abused: No      FAMILY HISTORY:  Family History  Problem Relation Age of Onset   Transient ischemic attack Mother    Hypertension Mother    Stroke Mother        mini strokes   Hypertension Sister    Cancer Sister        ovarian   Hypertension Sister    Hypertension Sister    Hypertension Sister    Breast cancer Neg Hx    Colon cancer Neg Hx      REVIEW OF SYSTEMS:  Constitutional: denies weight loss, fever, chills, or sweats  Eyes: denies any other vision changes, history of eye injury  ENT: denies sore throat, hearing problems  Respiratory: denies shortness of breath, wheezing  Cardiovascular: denies chest pain, palpitations  Gastrointestinal: abdominal pain, nausea and vomiting Genitourinary: denies burning with urination or urinary frequency Musculoskeletal: denies any other joint pains or cramps  Skin: denies any other rashes or skin discolorations  Neurological: denies any other headache, dizziness, weakness  Psychiatric: denies any other depression, anxiety   All other review of systems were negative   VITAL SIGNS:  Temp:  [97.7 F (36.5 C)] 97.7 F (36.5 C) (08/27 0603) Pulse Rate:  [77] 77 (08/27 0603) Resp:  [16] 16 (08/27 0603) BP: (165)/(71) 165/71 (08/27 0603) SpO2:  [98 %] 98 % (08/27 0603) Weight:  [56.7 kg] 56.7 kg (08/27 0603)     Height: 5\' 4"  (162.6 cm) Weight: 56.7 kg BMI (Calculated): 21.45   INTAKE/OUTPUT:  This shift: No intake/output data recorded.  Last 2 shifts: @IOLAST2SHIFTS @   PHYSICAL EXAM:  Constitutional:  -- Normal body habitus  -- Awake, alert, and oriented x3  Eyes:  -- Pupils equally round and reactive to light  -- No scleral icterus  Ear, nose, and throat:  -- No jugular venous distension  Pulmonary:  -- No crackles  -- Equal breath sounds bilaterally -- Breathing non-labored at rest Cardiovascular:  -- S1, S2 present  -- No pericardial  rubs Gastrointestinal:  -- Abdomen soft, nontender, non-distended, no guarding or rebound tenderness -- No abdominal masses appreciated, pulsatile  or otherwise  Musculoskeletal and Integumentary:  -- Wounds: None appreciated -- Extremities: B/L UE and LE FROM, hands and feet warm, no edema  Neurologic:  -- Motor function: intact and symmetric -- Sensation: intact and symmetric   Labs:     Latest Ref Rng & Units 06/26/2022    6:09 AM 04/11/2022   10:35 AM 01/31/2022   10:28 AM  CBC  WBC 4.0 - 10.5 K/uL 5.6  5.0  7.5   Hemoglobin 12.0 - 15.0 g/dL 13.7  13.0  12.9   Hematocrit 36.0 - 46.0 % 42.6  40.6  39.0   Platelets 150 - 400 K/uL 133  138  153.0       Latest Ref Rng & Units 06/26/2022    6:09 AM 04/20/2022    3:15 PM 04/11/2022   10:35 AM  CMP  Glucose 70 - 99 mg/dL 119  105  104   BUN 8 - 23 mg/dL 15  15  18    Creatinine 0.44 - 1.00 mg/dL 0.70  0.67  0.70   Sodium 135 - 145 mmol/L 141  135  139   Potassium 3.5 - 5.1 mmol/L 3.6  4.0  3.9   Chloride 98 - 111 mmol/L 104  101  104   CO2 22 - 32 mmol/L 29  28  30    Calcium 8.9 - 10.3 mg/dL 8.9  8.8  8.8   Total Protein 6.5 - 8.1 g/dL 6.8  6.6    Total Bilirubin 0.3 - 1.2 mg/dL 1.2  0.5    Alkaline Phos 38 - 126 U/L 48  54    AST 15 - 41 U/L 25  18    ALT 0 - 44 U/L 26  20      Imaging studies:  EXAM: CT ABDOMEN AND PELVIS WITH CONTRAST   TECHNIQUE: Multidetector CT imaging of the abdomen and pelvis was performed using the standard protocol following bolus administration of intravenous contrast.   RADIATION DOSE REDUCTION: This exam was performed according to the departmental dose-optimization program which includes automated exposure control, adjustment of the mA and/or kV according to patient size and/or use of iterative reconstruction technique.   CONTRAST:  126mL OMNIPAQUE IOHEXOL 300 MG/ML  SOLN   COMPARISON:  CT Chest, Abdomen, and Pelvis 06/07/2018   FINDINGS: Lower chest: Lung bases appear stable since  2019. No pericardial or pleural effusion.   Hepatobiliary: Chronically absent gallbladder. Liver appears stable since 2019 with unchanged tiny hypodense areas in the posterior right lobe which are benign.   Pancreas: Negative.   Spleen: Chronic borderline to mild splenomegaly, but splenic enhancement remains normal.   Adrenals/Urinary Tract: Negative.   Stomach/Bowel: Redundant but decompressed large bowel in the pelvis. Decompressed descending colon. Redundant transverse colon with mild retained stool. Right colon appears negative terminal ileum is decompressed. Appendix is not identified and could be diminutive or absent.   No dilated small bowel. However, there is a long segment (at least 14 cm) small bowel intussusception in the mid lower abdomen and extending into the pelvis. Distal lead point is on series 2, image 70 and coronal image 30. Proximal extent of the intussusception is on series 2, image 54 and coronal image 34. Upstream small bowel is fluid-filled, up to 2.7 cm diameter. But upstream of that the small-bowel quickly tapers. No regional mesenteric fluid or lymphadenopathy.   No free air. No free fluid identified. Stomach, duodenum, and proximal small bowel appear negative.   Vascular/Lymphatic: Mild Aortoiliac calcified atherosclerosis. Major  arterial structures in the abdomen and pelvis are patent. Portal venous system is patent. No lymphadenopathy identified.   Reproductive: Absent uterus. Diminutive or absent ovaries. Incidental pelvic phleboliths.   Other: No pelvic free fluid.   Musculoskeletal: Chronic lower lumbar spine degeneration. No acute or suspicious osseous lesion. Underlying capacious spinal canal.   IMPRESSION: 1. Positive for a long segment (roughly 14 cm) distal small bowel intussusception from the lower abdomen into the pelvis. This is upstream of the terminal ileum. No obvious mass at the distal lead point, but in this age group a  pathologic lead point would be most Common. No significant small bowel obstruction at this time. No bowel perforation or other complicating features. Recommend Surgery consultation.   2. No other acute finding in the abdomen or pelvis. No metastatic disease identified.   Electronically Signed: By: Genevie Ann M.D. On: 06/26/2022 09:34  Assessment/Plan:  76 y.o. female with small bowel intussusception, complicated by pertinent comorbidities including hypothyroidism, history of lung cancer.  Patient with CT scan findings consistent with small bowel intussusception.  The fact that this intussusception was also seen a month ago and not causing bowel obstruction seems to be a chronic intussusception.  I discussed with the patient the implication of having intussusception.  This is a long segment of 14 cm of small bowel intussuscepted.  Even though it is not causing an obstruction I recommended the patient to have surgical intervention for resection of these small bowel portion.  I discussed with patient the possibility of being a benign lesion of the small bowel causing the intussusception but malignancy needs to be ruled out as well.  At this moment the abdominal pain has improved and patient denies any nausea or vomiting.  When discussing about having small bowel resection today the patient was concerned about needing to coordinate care for his husband that cannot take care of by himself.  I discussed with patient that even though this is not an emergency that it does not need to be done today I recommended patient to have the surgery as soon as possible.  We discussed about discharging her if she tolerated liquids and stay on liquids until surgery day possibly on Tuesday.  I discussed with patient the surgery of small bowel resection with anastomosis.  Risk of surgery includes bleeding, infection, injury to other part of the intestine, anastomosis leak, bowel obstruction, among others.  The patient reported  she understood and agreed to coordinate surgery for Tuesday.  Arnold Long, MD

## 2022-06-27 ENCOUNTER — Encounter: Payer: Self-pay | Admitting: Family Medicine

## 2022-06-27 ENCOUNTER — Telehealth: Payer: Self-pay

## 2022-06-27 ENCOUNTER — Telehealth: Payer: Self-pay | Admitting: Gastroenterology

## 2022-06-27 MED ORDER — CEFAZOLIN SODIUM-DEXTROSE 2-4 GM/100ML-% IV SOLN
2.0000 g | INTRAVENOUS | Status: AC
  Administered 2022-06-28: 2 g via INTRAVENOUS

## 2022-06-27 MED ORDER — SODIUM CHLORIDE 0.9 % IV SOLN: INTRAVENOUS | Status: DC

## 2022-06-27 NOTE — Telephone Encounter (Signed)
Called and talk to penny and got patient canceled. Informed patient that it has been canceled

## 2022-06-27 NOTE — Telephone Encounter (Signed)
Submitted pa for the Zofran 4mg  on cover my meds. Waiting on response from insurance company

## 2022-06-27 NOTE — Telephone Encounter (Signed)
Pt called and cancelled colonoscopy having another bowel procedure .Will call back to resched.

## 2022-06-28 ENCOUNTER — Inpatient Hospital Stay
Admission: RE | Admit: 2022-06-28 | Discharge: 2022-07-01 | DRG: 331 | Disposition: A | Discharge status: Home / Self Care | Payer: PPO | Attending: General Surgery | Admitting: General Surgery

## 2022-06-28 ENCOUNTER — Telehealth: Payer: Self-pay | Admitting: Family Medicine

## 2022-06-28 ENCOUNTER — Other Ambulatory Visit: Payer: Self-pay

## 2022-06-28 ENCOUNTER — Encounter
Admission: RE | Disposition: A | Discharge status: Home / Self Care | Payer: Self-pay | Source: Home / Self Care | Attending: General Surgery

## 2022-06-28 ENCOUNTER — Encounter: Payer: Self-pay | Admitting: General Surgery

## 2022-06-28 ENCOUNTER — Inpatient Hospital Stay: Payer: PPO | Admitting: Certified Registered"

## 2022-06-28 DIAGNOSIS — Z85850 Personal history of malignant neoplasm of thyroid: Secondary | ICD-10-CM

## 2022-06-28 DIAGNOSIS — K561 Intussusception: Principal | ICD-10-CM | POA: Diagnosis present

## 2022-06-28 DIAGNOSIS — Z823 Family history of stroke: Secondary | ICD-10-CM

## 2022-06-28 DIAGNOSIS — Z90710 Acquired absence of both cervix and uterus: Secondary | ICD-10-CM | POA: Diagnosis not present

## 2022-06-28 DIAGNOSIS — Z79890 Hormone replacement therapy (postmenopausal): Secondary | ICD-10-CM

## 2022-06-28 DIAGNOSIS — G4730 Sleep apnea, unspecified: Secondary | ICD-10-CM | POA: Diagnosis present

## 2022-06-28 DIAGNOSIS — E890 Postprocedural hypothyroidism: Secondary | ICD-10-CM | POA: Diagnosis present

## 2022-06-28 DIAGNOSIS — Z882 Allergy status to sulfonamides status: Secondary | ICD-10-CM

## 2022-06-28 DIAGNOSIS — Z9989 Dependence on other enabling machines and devices: Secondary | ICD-10-CM | POA: Diagnosis not present

## 2022-06-28 DIAGNOSIS — E785 Hyperlipidemia, unspecified: Secondary | ICD-10-CM | POA: Diagnosis present

## 2022-06-28 DIAGNOSIS — Z881 Allergy status to other antibiotic agents status: Secondary | ICD-10-CM | POA: Diagnosis not present

## 2022-06-28 DIAGNOSIS — Z888 Allergy status to other drugs, medicaments and biological substances status: Secondary | ICD-10-CM | POA: Diagnosis not present

## 2022-06-28 DIAGNOSIS — K219 Gastro-esophageal reflux disease without esophagitis: Secondary | ICD-10-CM | POA: Diagnosis present

## 2022-06-28 DIAGNOSIS — Z86010 Personal history of colonic polyps: Secondary | ICD-10-CM

## 2022-06-28 DIAGNOSIS — Z8249 Family history of ischemic heart disease and other diseases of the circulatory system: Secondary | ICD-10-CM

## 2022-06-28 DIAGNOSIS — Z85118 Personal history of other malignant neoplasm of bronchus and lung: Secondary | ICD-10-CM | POA: Diagnosis not present

## 2022-06-28 DIAGNOSIS — I10 Essential (primary) hypertension: Secondary | ICD-10-CM | POA: Diagnosis present

## 2022-06-28 DIAGNOSIS — Z7982 Long term (current) use of aspirin: Secondary | ICD-10-CM | POA: Diagnosis not present

## 2022-06-28 DIAGNOSIS — D1339 Benign neoplasm of other parts of small intestine: Secondary | ICD-10-CM | POA: Diagnosis present

## 2022-06-28 HISTORY — PX: XI ROBOTIC ASSISTED SMALL BOWEL RESECTION: SHX6872

## 2022-06-28 SURGERY — EXCISION, SMALL INTESTINE, ROBOT-ASSISTED
Anesthesia: General | Site: Abdomen

## 2022-06-28 MED ORDER — BUPIVACAINE LIPOSOME 1.3 % IJ SUSP
INTRAMUSCULAR | Status: DC | PRN
  Administered 2022-06-28: 50 mL

## 2022-06-28 MED ORDER — MIDAZOLAM HCL 2 MG/2ML IJ SOLN
INTRAMUSCULAR | Status: AC
  Filled 2022-06-28: qty 2

## 2022-06-28 MED ORDER — HYDROCODONE-ACETAMINOPHEN 5-325 MG PO TABS
1.0000 | ORAL_TABLET | ORAL | Status: DC | PRN
  Administered 2022-06-29: 1 via ORAL
  Administered 2022-06-29 (×2): 2 via ORAL
  Administered 2022-06-29 – 2022-07-01 (×9): 1 via ORAL
  Filled 2022-06-28 (×2): qty 1
  Filled 2022-06-28 (×2): qty 2
  Filled 2022-06-28 (×8): qty 1
  Filled 2022-06-28: qty 2

## 2022-06-28 MED ORDER — LIDOCAINE HCL (CARDIAC) PF 100 MG/5ML IV SOSY
PREFILLED_SYRINGE | INTRAVENOUS | Status: DC | PRN
  Administered 2022-06-28: 50 mg via INTRAVENOUS

## 2022-06-28 MED ORDER — BUPIVACAINE LIPOSOME 1.3 % IJ SUSP
INTRAMUSCULAR | Status: AC
  Filled 2022-06-28: qty 20

## 2022-06-28 MED ORDER — OXYCODONE HCL 5 MG/5ML PO SOLN: 5.0000 mg | Freq: Once | ORAL | Status: AC | PRN

## 2022-06-28 MED ORDER — FAMOTIDINE 20 MG PO TABS
ORAL_TABLET | ORAL | Status: AC
  Administered 2022-06-28: 20 mg via ORAL
  Filled 2022-06-28: qty 1

## 2022-06-28 MED ORDER — LACTATED RINGERS IV SOLN: INTRAVENOUS | Status: DC | PRN

## 2022-06-28 MED ORDER — ENOXAPARIN SODIUM 40 MG/0.4ML IJ SOSY
40.0000 mg | PREFILLED_SYRINGE | INTRAMUSCULAR | Status: DC
  Administered 2022-06-29 – 2022-06-30 (×2): 40 mg via SUBCUTANEOUS
  Filled 2022-06-28 (×2): qty 0.4

## 2022-06-28 MED ORDER — MORPHINE SULFATE (PF) 4 MG/ML IV SOLN
4.0000 mg | INTRAVENOUS | Status: DC | PRN
  Administered 2022-06-28: 4 mg via INTRAVENOUS
  Filled 2022-06-28: qty 1

## 2022-06-28 MED ORDER — BIOTIN 1000 MCG PO TABS: 1000.0000 ug | ORAL_TABLET | Freq: Every day | ORAL | Status: DC

## 2022-06-28 MED ORDER — MIDAZOLAM HCL 2 MG/2ML IJ SOLN
INTRAMUSCULAR | Status: DC | PRN
  Administered 2022-06-28: 2 mg via INTRAVENOUS

## 2022-06-28 MED ORDER — 0.9 % SODIUM CHLORIDE (POUR BTL) OPTIME
TOPICAL | Status: DC | PRN
  Administered 2022-06-28: 150 mL

## 2022-06-28 MED ORDER — OXYCODONE HCL 5 MG PO TABS
5.0000 mg | ORAL_TABLET | Freq: Once | ORAL | Status: AC | PRN
  Administered 2022-06-28: 5 mg via ORAL

## 2022-06-28 MED ORDER — ORAL CARE MOUTH RINSE
15.0000 mL | Freq: Once | OROMUCOSAL | Status: AC
Start: 2022-06-28 — End: 2022-06-28

## 2022-06-28 MED ORDER — FENTANYL CITRATE (PF) 100 MCG/2ML IJ SOLN
25.0000 ug | INTRAMUSCULAR | Status: DC | PRN
  Administered 2022-06-28 (×3): 25 ug via INTRAVENOUS

## 2022-06-28 MED ORDER — DEXMEDETOMIDINE HCL IN NACL 200 MCG/50ML IV SOLN
INTRAVENOUS | Status: DC | PRN
  Administered 2022-06-28: 8 ug via INTRAVENOUS

## 2022-06-28 MED ORDER — PHENYLEPHRINE HCL-NACL 20-0.9 MG/250ML-% IV SOLN
INTRAVENOUS | Status: DC | PRN
  Administered 2022-06-28: 100 ug/min via INTRAVENOUS

## 2022-06-28 MED ORDER — ONDANSETRON HCL 4 MG/2ML IJ SOLN
INTRAMUSCULAR | Status: AC
  Filled 2022-06-28: qty 2

## 2022-06-28 MED ORDER — ROCURONIUM BROMIDE 100 MG/10ML IV SOLN
INTRAVENOUS | Status: DC | PRN
  Administered 2022-06-28: 30 mg via INTRAVENOUS
  Administered 2022-06-28 (×2): 20 mg via INTRAVENOUS
  Administered 2022-06-28: 30 mg via INTRAVENOUS

## 2022-06-28 MED ORDER — OXYCODONE HCL 5 MG PO TABS
ORAL_TABLET | ORAL | Status: AC
  Filled 2022-06-28: qty 1

## 2022-06-28 MED ORDER — ACETAMINOPHEN 10 MG/ML IV SOLN
1000.0000 mg | Freq: Once | INTRAVENOUS | Status: DC | PRN
Start: 2022-06-28 — End: 2022-06-28

## 2022-06-28 MED ORDER — PROPOFOL 10 MG/ML IV BOLUS
INTRAVENOUS | Status: DC | PRN
  Administered 2022-06-28: 120 mg via INTRAVENOUS

## 2022-06-28 MED ORDER — PHENYLEPHRINE HCL (PRESSORS) 10 MG/ML IV SOLN
INTRAVENOUS | Status: DC | PRN
  Administered 2022-06-28 (×2): 160 ug via INTRAVENOUS

## 2022-06-28 MED ORDER — ONDANSETRON 4 MG PO TBDP: 4.0000 mg | ORAL_TABLET | Freq: Four times a day (QID) | ORAL | Status: DC | PRN

## 2022-06-28 MED ORDER — CHLORHEXIDINE GLUCONATE 0.12 % MT SOLN
15.0000 mL | Freq: Once | OROMUCOSAL | Status: AC
Start: 2022-06-28 — End: 2022-06-28

## 2022-06-28 MED ORDER — SCOPOLAMINE 1 MG/3DAYS TD PT72: 1.0000 | MEDICATED_PATCH | TRANSDERMAL | Status: DC

## 2022-06-28 MED ORDER — FENTANYL CITRATE (PF) 100 MCG/2ML IJ SOLN
INTRAMUSCULAR | Status: AC
  Filled 2022-06-28: qty 2

## 2022-06-28 MED ORDER — CELECOXIB 200 MG PO CAPS
200.0000 mg | ORAL_CAPSULE | Freq: Two times a day (BID) | ORAL | Status: DC
  Administered 2022-06-28 – 2022-07-01 (×6): 200 mg via ORAL
  Filled 2022-06-28 (×6): qty 1

## 2022-06-28 MED ORDER — LACTATED RINGERS IV SOLN: INTRAVENOUS | Status: DC

## 2022-06-28 MED ORDER — GABAPENTIN 300 MG PO CAPS
300.0000 mg | ORAL_CAPSULE | Freq: Two times a day (BID) | ORAL | Status: DC
  Administered 2022-06-28 – 2022-07-01 (×6): 300 mg via ORAL
  Filled 2022-06-28 (×6): qty 1

## 2022-06-28 MED ORDER — FAMOTIDINE 20 MG PO TABS: 20.0000 mg | ORAL_TABLET | Freq: Once | ORAL | Status: AC | PRN

## 2022-06-28 MED ORDER — CEFAZOLIN SODIUM-DEXTROSE 2-4 GM/100ML-% IV SOLN
INTRAVENOUS | Status: AC
  Filled 2022-06-28: qty 100

## 2022-06-28 MED ORDER — LUTEIN 20 MG PO CAPS: 20.0000 mg | ORAL_CAPSULE | Freq: Every day | ORAL | Status: DC

## 2022-06-28 MED ORDER — ONDANSETRON HCL 4 MG/2ML IJ SOLN: 4.0000 mg | Freq: Once | INTRAMUSCULAR | Status: DC | PRN

## 2022-06-28 MED ORDER — ONDANSETRON HCL 4 MG/2ML IJ SOLN
INTRAMUSCULAR | Status: DC | PRN
  Administered 2022-06-28: 4 mg via INTRAVENOUS

## 2022-06-28 MED ORDER — POTASSIUM GLUCONATE 595 MG PO CAPS: 595.0000 mg | ORAL_CAPSULE | Freq: Every day | ORAL | Status: DC

## 2022-06-28 MED ORDER — PANTOPRAZOLE SODIUM 40 MG PO TBEC
40.0000 mg | DELAYED_RELEASE_TABLET | Freq: Every day | ORAL | Status: DC
  Administered 2022-06-29 – 2022-07-01 (×3): 40 mg via ORAL
  Filled 2022-06-28 (×3): qty 1

## 2022-06-28 MED ORDER — ONDANSETRON HCL 4 MG/2ML IJ SOLN
4.0000 mg | Freq: Four times a day (QID) | INTRAMUSCULAR | Status: DC | PRN
  Administered 2022-06-28: 4 mg via INTRAVENOUS
  Filled 2022-06-28: qty 2

## 2022-06-28 MED ORDER — CHLORHEXIDINE GLUCONATE 0.12 % MT SOLN
OROMUCOSAL | Status: AC
  Administered 2022-06-28: 15 mL via OROMUCOSAL
  Filled 2022-06-28: qty 15

## 2022-06-28 MED ORDER — INDOCYANINE GREEN 25 MG IV SOLR
INTRAVENOUS | Status: DC | PRN
  Administered 2022-06-28: 5 mg via INTRAVENOUS

## 2022-06-28 MED ORDER — FENTANYL CITRATE (PF) 100 MCG/2ML IJ SOLN
INTRAMUSCULAR | Status: DC | PRN
  Administered 2022-06-28: 75 ug via INTRAVENOUS
  Administered 2022-06-28: 100 ug via INTRAVENOUS
  Administered 2022-06-28: 25 ug via INTRAVENOUS

## 2022-06-28 MED ORDER — LEVOTHYROXINE SODIUM 50 MCG PO TABS
75.0000 ug | ORAL_TABLET | Freq: Every day | ORAL | Status: DC
  Administered 2022-06-29 – 2022-07-01 (×3): 75 ug via ORAL
  Filled 2022-06-28 (×3): qty 2

## 2022-06-28 MED ORDER — FENTANYL CITRATE (PF) 100 MCG/2ML IJ SOLN
INTRAMUSCULAR | Status: AC
  Administered 2022-06-28: 25 ug via INTRAVENOUS
  Filled 2022-06-28: qty 2

## 2022-06-28 MED ORDER — BUPIVACAINE HCL (PF) 0.5 % IJ SOLN
INTRAMUSCULAR | Status: AC
  Filled 2022-06-28: qty 30

## 2022-06-28 MED ORDER — SODIUM CHLORIDE 0.9 % IV SOLN: INTRAVENOUS | Status: DC

## 2022-06-28 MED ORDER — DEXAMETHASONE SODIUM PHOSPHATE 10 MG/ML IJ SOLN
INTRAMUSCULAR | Status: DC | PRN
  Administered 2022-06-28: 8 mg via INTRAVENOUS

## 2022-06-28 MED ORDER — SCOPOLAMINE 1 MG/3DAYS TD PT72
MEDICATED_PATCH | TRANSDERMAL | Status: AC
  Administered 2022-06-28: 1.5 mg via TRANSDERMAL
  Filled 2022-06-28: qty 1

## 2022-06-28 SURGICAL SUPPLY — 67 items
ADH SKN CLS APL DERMABOND .7 (GAUZE/BANDAGES/DRESSINGS) ×1
BLADE CLIPPER SURG (BLADE) ×1 IMPLANT
BLADE SURG SZ11 CARB STEEL (BLADE) ×1 IMPLANT
CANNULA CAP OBTURATR AIRSEAL 8 (CAP) IMPLANT
CANNULA REDUC XI 12-8 STAPL (CANNULA) ×1
CANNULA REDUCER 12-8 DVNC XI (CANNULA) ×1 IMPLANT
COVER TIP SHEARS 8 DVNC (MISCELLANEOUS) ×1 IMPLANT
COVER TIP SHEARS 8MM DA VINCI (MISCELLANEOUS) ×1
DERMABOND ADVANCED (GAUZE/BANDAGES/DRESSINGS) ×1
DERMABOND ADVANCED .7 DNX12 (GAUZE/BANDAGES/DRESSINGS) IMPLANT
DRAPE ARM DVNC X/XI (DISPOSABLE) ×4 IMPLANT
DRAPE COLUMN DVNC XI (DISPOSABLE) ×1 IMPLANT
DRAPE DA VINCI XI ARM (DISPOSABLE) ×4
DRAPE DA VINCI XI COLUMN (DISPOSABLE) ×1
ELECT CAUTERY BLADE 6.4 (BLADE) IMPLANT
ELECT REM PT RETURN 9FT ADLT (ELECTROSURGICAL) ×1
ELECTRODE REM PT RTRN 9FT ADLT (ELECTROSURGICAL) ×1 IMPLANT
GLOVE BIO SURGEON STRL SZ 6.5 (GLOVE) ×3 IMPLANT
GLOVE BIOGEL PI IND STRL 6.5 (GLOVE) ×3 IMPLANT
GLOVE BIOGEL PI INDICATOR 6.5 (GLOVE) ×6
GOWN STRL REUS W/ TWL LRG LVL3 (GOWN DISPOSABLE) ×6 IMPLANT
GOWN STRL REUS W/TWL LRG LVL3 (GOWN DISPOSABLE) ×3
KIT IMAGING PINPOINTPAQ (MISCELLANEOUS) ×1 IMPLANT
KIT PINK PAD W/HEAD ARE REST (MISCELLANEOUS) ×1
KIT PINK PAD W/HEAD ARM REST (MISCELLANEOUS) ×1 IMPLANT
MANIFOLD NEPTUNE II (INSTRUMENTS) ×1 IMPLANT
NDL INSUFFLATION 14GA 120MM (NEEDLE) ×1 IMPLANT
NEEDLE HYPO 22GX1.5 SAFETY (NEEDLE) ×1 IMPLANT
NEEDLE INSUFFLATION 14GA 120MM (NEEDLE) ×1 IMPLANT
OBTURATOR OPTICAL STANDARD 8MM (TROCAR) ×1
OBTURATOR OPTICAL STND 8 DVNC (TROCAR) ×1
OBTURATOR OPTICALSTD 8 DVNC (TROCAR) ×1 IMPLANT
PACK LAP CHOLECYSTECTOMY (MISCELLANEOUS) ×1 IMPLANT
PENCIL SMOKE EVACUATOR (MISCELLANEOUS) IMPLANT
RELOAD STAPLE 45 2.5 WHT DVNC (STAPLE) IMPLANT
RELOAD STAPLE 45 3.5 BLU DVNC (STAPLE) IMPLANT
RELOAD STAPLER 2.5X45 WHT DVNC (STAPLE) ×1 IMPLANT
RELOAD STAPLER 3.5X45 BLU DVNC (STAPLE) ×2 IMPLANT
SEAL CANN UNIV 5-8 DVNC XI (MISCELLANEOUS) ×3 IMPLANT
SEAL XI 5MM-8MM UNIVERSAL (MISCELLANEOUS) ×3
SEALER VESSEL DA VINCI XI (MISCELLANEOUS) ×1
SEALER VESSEL EXT DVNC XI (MISCELLANEOUS) IMPLANT
SET TUBE FILTERED XL AIRSEAL (SET/KITS/TRAYS/PACK) IMPLANT
SOLUTION ELECTROLUBE (MISCELLANEOUS) ×1 IMPLANT
SPONGE T-LAP 18X18 ~~LOC~~+RFID (SPONGE) ×1 IMPLANT
STAPLER 45 DA VINCI SURE FORM (STAPLE) ×1
STAPLER 45 SUREFORM DVNC (STAPLE) IMPLANT
STAPLER CANNULA SEAL DVNC XI (STAPLE) ×1 IMPLANT
STAPLER CANNULA SEAL XI (STAPLE) ×1
STAPLER RELOAD 2.5X45 WHITE (STAPLE) ×1
STAPLER RELOAD 2.5X45 WHT DVNC (STAPLE) ×1
STAPLER RELOAD 3.5X45 BLU DVNC (STAPLE) ×2
STAPLER RELOAD 3.5X45 BLUE (STAPLE) ×2
SUT MNCRL AB 4-0 PS2 18 (SUTURE) ×1 IMPLANT
SUT SILK 3 0 SH 30 (SUTURE) IMPLANT
SUT STRATAFIX 0 PDS+ CT-2 23 (SUTURE) ×1
SUT V-LOC 90 ABS 3-0 VLT  V-20 (SUTURE) ×1
SUT V-LOC 90 ABS 3-0 VLT V-20 (SUTURE) IMPLANT
SUT VIC AB 3-0 SH 27 (SUTURE) ×1
SUT VIC AB 3-0 SH 27X BRD (SUTURE) ×4 IMPLANT
SUT VLOC 90 6 CV-15 VIOLET (SUTURE) ×1 IMPLANT
SUTURE STRATFX 0 PDS+ CT-2 23 (SUTURE) IMPLANT
SYR 30ML LL (SYRINGE) ×2 IMPLANT
SYS BAG RETRIEVAL 10MM (BASKET) ×1
SYSTEM BAG RETRIEVAL 10MM (BASKET) IMPLANT
TRAY FOLEY MTR SLVR 16FR STAT (SET/KITS/TRAYS/PACK) ×1 IMPLANT
WATER STERILE IRR 500ML POUR (IV SOLUTION) ×1 IMPLANT

## 2022-06-28 NOTE — Interval H&P Note (Signed)
History and Physical Interval Note:  06/28/2022 6:10 PM  [name]  has presented today for surgery, with the diagnosis of K56.1 Intussusception intestine.  The various methods of treatment have been discussed with the patient and family. After consideration of risks, benefits and other options for treatment, the patient has consented to  Procedure(s): XI ROBOTIC ASSISTED SMALL BOWEL RESECTION (N/A) as a surgical intervention.  The patient's history has been reviewed, patient examined, no change in status, stable for surgery.  I have reviewed the patient's chart and labs.  Questions were answered to the patient's satisfaction.     Herbert Pun

## 2022-06-28 NOTE — Op Note (Signed)
Preoperative diagnosis: Intussusception of small intestine  Postoperative diagnosis: Same  Procedure: Robotic assisted laparoscopic small bowel resection with anastomosis.   Anesthesia: GETA   Surgeon: Herbert Pun, MD  Assistant: None   Wound Classification: clean contaminated   Specimen: Small intestine (jejunum)   Complications: None   Estimated Blood Loss: 5 mL   Indications: Patient is a 76 y.o. female with symptoms of abdominal pain was found to have long segment of the small intestine intussuscepted. Resection was indicated for management of abdominal pain and rule out malignancy.   FIndings: 1.  Intussusception of the jejunum with intramucosal mass as the lead point 2.  Adequate hemostasis.  3.  No gross metastasis noted        Description of procedure: The patient was placed on the operating table in the supine position, both arms tucked. General anesthesia was induced. A time-out was completed verifying correct patient, procedure, site, positioning, and implant(s) and/or special equipment prior to beginning this procedure. The abdomen was prepped and draped in the usual sterile fashion.    Veress needle was inserted in the Palmer's point.  Gas insufflation was initiated until the abdominal pressure was measured at 15 mmHg.  A small incision was done in the left upper quadrant.  Access to the abdominal cavity was done in the Optiview fashion.  Afterwards, 3 additional incision was made 5 cm apart along the left side of the abdominal wall from the initial incision and two additional 23mm port and one 12 mm port were placed under direct visualization. No injuries from trocar placements were noted. Exparel infused on the bilateral abdominal wall. The table was placed in the trendelenburg position.  Xi robotic platform was then brought to the operative field and docked at an angle from the left lower quadrant.  Tip up grasper and scissors was placed in right arm ports.   Forcebipolar in left arm port.   Examination of the abdominal cavity noted no signs of gross metastasis.  The small intestine was inspected from listed above and run proximally.  At the jejunal I was able to identified the intussusception with the mass as a lead point.  I proceeded to do the small bowel resection.  A window was created by using a vessel sealer device to separate the mesentery from the bowel at each resection margin. The mesentery was divided with vessel sealer.  5 mg of ICG was given to the patient.  Deep was used for assessment of vascularity at the pedicles flaps.  Once adequate ICG perfusion was identified, the bowel was divided with a cutting linear stapler at each resection margin. The antimesenteric angles of the proximal and distal segments were then approximated with two sutures of 3-0 silk placed approximately 5 cm apart. Enterotomies were made at the antimesenteric borders and the cutting linear stapler inserted and fired. The lumen was inspected for hemostasis. The enterotomies were closed with 3-0 V-Loc.  The same suture was used to close the mesenteric defect.  The anastomosis was then inspected for patency and integrity.  The robot was undocked.  12 mm port removed and the incision was extended.  The small intestine was removed. This port site was then closed primarily using 0 STRATAFIX. Deep dermal then closed with 3-0 vicryl interrupted fashion.    All skin incisions then closed with subcuticular sutures Monocryl 4-0. Wounds then dressed with dermabond.   The patient tolerated the procedure well, awakened from anesthesia and was taken to the postanesthesia care unit in  satisfactory condition.  Foley still in place.  Sponge count and instrument count correct at the end of the procedure.

## 2022-06-28 NOTE — Telephone Encounter (Signed)
Left message for patient to call back and schedule Medicare Annual Wellness Visit (AWV).   Please offer to do virtually or by telephone.   Last AWV:: 07/01/2021  Please schedule at anytime with [facility] Advisor schedule   45 minute appointent  If any questions, please contact me at [phone]

## 2022-06-28 NOTE — Anesthesia Procedure Notes (Signed)
Procedure Name: Intubation Date/Time: 06/28/2022 2:33 PM  Performed by: Beverely Low, CRNAPre-anesthesia Checklist: Patient identified, Patient being monitored, Timeout performed, Emergency Drugs available and Suction available Patient Re-evaluated:Patient Re-evaluated prior to induction Oxygen Delivery Method: Circle system utilized Preoxygenation: Pre-oxygenation with 100% oxygen Induction Type: IV induction Ventilation: Mask ventilation without difficulty Laryngoscope Size: McGraph and 3 Grade View: Grade I Tube type: Oral Tube size: 7.0 mm Number of attempts: 1 Airway Equipment and Method: Stylet Placement Confirmation: ETT inserted through vocal cords under direct vision, positive ETCO2 and breath sounds checked- equal and bilateral Secured at: 21 cm Tube secured with: Tape Dental Injury: Teeth and Oropharynx as per pre-operative assessment

## 2022-06-28 NOTE — Anesthesia Preprocedure Evaluation (Addendum)
Anesthesia Evaluation  Patient identified by MRN, date of birth, ID band Patient awake    Reviewed: Allergy & Precautions, NPO status , Patient's Chart, lab work & pertinent test results  History of Anesthesia Complications (+) PONV and history of anesthetic complications  Airway Mallampati: II   Neck ROM: Full    Dental   Bridge, crowns:   Pulmonary sleep apnea and Continuous Positive Airway Pressure Ventilation ,  Lung CA   Pulmonary exam normal breath sounds clear to auscultation       Cardiovascular hypertension, Normal cardiovascular exam Rhythm:Regular Rate:Normal  ECG 06/26/22: normal   Neuro/Psych negative neurological ROS     GI/Hepatic GERD  ,  Endo/Other  Hypothyroidism (thyroid CA s/p thyroidectomy)   Renal/GU negative Renal ROS     Musculoskeletal   Abdominal   Peds  Hematology negative hematology ROS (+)   Anesthesia Other Findings Cardiology note 05/19/22:  1. Chest pain, coronary CT showed calcium score 19.1, minimal proximal LAD stenosis less than 25%.  Echo in 2021 was normal, EF 60 to 65%.  Continue aspirin, pravastatin 40 mg daily. 2. Hyperlipidemia, cholesterol controlled, continue pravastatin.  Did not tolerate Lipitor and simvastatin in the past  Follow-up yearly.  Reproductive/Obstetrics                            Anesthesia Physical Anesthesia Plan  ASA: 2  Anesthesia Plan: General   Post-op Pain Management:    Induction: Intravenous  PONV Risk Score and Plan: 4 or greater and Ondansetron, Dexamethasone, Treatment may vary due to age or medical condition and Scopolamine patch - Pre-op  Airway Management Planned: Oral ETT  Additional Equipment:   Intra-op Plan:   Post-operative Plan: Extubation in OR  Informed Consent: I have reviewed the patients History and Physical, chart, labs and discussed the procedure including the risks, benefits and  alternatives for the proposed anesthesia with the patient or authorized representative who has indicated his/her understanding and acceptance.     Dental advisory given  Plan Discussed with: CRNA  Anesthesia Plan Comments: (Patient consented for risks of anesthesia including but not limited to:  - adverse reactions to medications - damage to eyes, teeth, lips or other oral mucosa - nerve damage due to positioning  - sore throat or hoarseness - damage to heart, brain, nerves, lungs, other parts of body or loss of life  Informed patient about role of CRNA in peri- and intra-operative care.  Patient voiced understanding.)        Anesthesia Quick Evaluation

## 2022-06-28 NOTE — Transfer of Care (Addendum)
Immediate Anesthesia Transfer of Care Note  Patient: [name]  Procedure(s) Performed: XI ROBOTIC ASSISTED SMALL BOWEL RESECTION --- lap vs open (Abdomen)  Patient Location: PACU  Anesthesia Type:General  Level of Consciousness: drowsy  Airway & Oxygen Therapy: Patient Spontanous Breathing and Patient connected to face mask oxygen  Post-op Assessment: Report given to RN and Post -op Vital signs reviewed and stable  Post vital signs: Reviewed and stable  Last Vitals:  Vitals Value Taken Time  BP    Temp    Pulse    Resp    SpO2      Last Pain:  Vitals:   06/28/22 1245  TempSrc: Temporal  PainSc: 0-No pain         Complications: No notable events documented.

## 2022-06-29 ENCOUNTER — Encounter: Payer: Self-pay | Admitting: General Surgery

## 2022-06-29 LAB — CBC
HCT: 36.8 % (ref 36.0–46.0)
Hemoglobin: 11.9 g/dL — ABNORMAL LOW (ref 12.0–15.0)
MCH: 30.6 pg (ref 26.0–34.0)
MCHC: 32.3 g/dL (ref 30.0–36.0)
MCV: 94.6 fL (ref 80.0–100.0)
Platelets: 160 10*3/uL (ref 150–400)
RBC: 3.89 MIL/uL (ref 3.87–5.11)
RDW: 14.3 % (ref 11.5–15.5)
WBC: 10.5 10*3/uL (ref 4.0–10.5)
nRBC: 0 % (ref 0.0–0.2)

## 2022-06-29 LAB — BASIC METABOLIC PANEL
Anion gap: 5 (ref 5–15)
BUN: 11 mg/dL (ref 8–23)
CO2: 25 mmol/L (ref 22–32)
Calcium: 8 mg/dL — ABNORMAL LOW (ref 8.9–10.3)
Chloride: 104 mmol/L (ref 98–111)
Creatinine, Ser: 0.72 mg/dL (ref 0.44–1.00)
GFR, Estimated: >60 mL/min (ref >60)
Glucose, Bld: 104 mg/dL — ABNORMAL HIGH (ref 70–99)
Potassium: 4 mmol/L (ref 3.5–5.1)
Sodium: 134 mmol/L — ABNORMAL LOW (ref 135–145)

## 2022-06-29 NOTE — Progress Notes (Signed)
Patient ID: [name], female   DOB: 11/13/1945, 76 y.o.   MRN: [id number]     [place] Day(s): 1.   Interval History: Patient seen and examined, no acute events or new complaints overnight. Patient reports feeling better than what she was expecting.  She endorses that the pain is currently controlled.  She denies any nausea or vomiting.  Vital signs in last 24 hours: [min-max] current  Temp:  [97.1 F (36.2 C)-98.4 F (36.9 C)] 98.3 F (36.8 C) (08/30 0802) Pulse Rate:  [62-77] 63 (08/30 0802) Resp:  [7-18] 16 (08/30 0802) BP: (93-155)/(46-76) 93/53 (08/30 0802) SpO2:  [92 %-100 %] 98 % (08/30 0802)     Height: 5\' 4"  (162.6 cm) Weight: 56.7 kg BMI (Calculated): 21.45   Physical Exam:  Constitutional: alert, cooperative and no distress  Respiratory: breathing non-labored at rest  Cardiovascular: regular rate and sinus rhythm  Gastrointestinal: soft, non-tender, and non-distended.  Incisions are dry and clean.  Labs:     Latest Ref Rng & Units 06/29/2022    6:09 AM 06/26/2022    6:09 AM 04/11/2022   10:35 AM  CBC  WBC 4.0 - 10.5 K/uL 10.5  5.6  5.0   Hemoglobin 12.0 - 15.0 g/dL 11.9  13.7  13.0   Hematocrit 36.0 - 46.0 % 36.8  42.6  40.6   Platelets 150 - 400 K/uL 160  133  138       Latest Ref Rng & Units 06/29/2022    6:09 AM 06/26/2022    6:09 AM 04/20/2022    3:15 PM  CMP  Glucose 70 - 99 mg/dL 104  119  105   BUN 8 - 23 mg/dL 11  15  15    Creatinine 0.44 - 1.00 mg/dL 0.72  0.70  0.67   Sodium 135 - 145 mmol/L 134  141  135   Potassium 3.5 - 5.1 mmol/L 4.0  3.6  4.0   Chloride 98 - 111 mmol/L 104  104  101   CO2 22 - 32 mmol/L 25  29  28    Calcium 8.9 - 10.3 mg/dL 8.0  8.9  8.8   Total Protein 6.5 - 8.1 g/dL  6.8  6.6   Total Bilirubin 0.3 - 1.2 mg/dL  1.2  0.5   Alkaline Phos 38 - 126 U/L  48  54   AST 15 - 41 U/L  25  18   ALT 0 - 44 U/L  26  20     Imaging studies: No new pertinent imaging studies   Assessment/Plan:  76 y.o.  female with small bowel intussusception 1 Day Post-Op s/p small bowel resection.  Patient with adequate postop progress -Currently with stable vital signs, no fever. -Adequate postop labs -No nausea or vomiting -Patient cannot recall if she had eating anything last night.  We will assess for liquid toleration.  If she tolerates we will consider advancing to soft diet. -Encouraged patient to ambulate -We will continue with DVT prophylaxis  Arnold Long, MD

## 2022-06-29 NOTE — Plan of Care (Signed)
Pt alert and oriented. UP to bsc x 2. Up to BR x 1. Bsx4. SCD on. Pt received 1 dose of morphine and 1 dose of zofran. 2 dose of norco.  Problem: Education: Goal: Knowledge of General Education information will improve Description: Including pain rating scale, medication(s)/side effects and non-pharmacologic comfort measures Outcome: Progressing   Problem: Health Behavior/Discharge Planning: Goal: Ability to manage health-related needs will improve Outcome: Progressing   Problem: Clinical Measurements: Goal: Ability to maintain clinical measurements within normal limits will improve Outcome: Progressing Goal: Will remain free from infection Outcome: Progressing Goal: Diagnostic test results will improve Outcome: Progressing Goal: Respiratory complications will improve Outcome: Progressing Goal: Cardiovascular complication will be avoided Outcome: Progressing   Problem: Activity: Goal: Risk for activity intolerance will decrease Outcome: Progressing   Problem: Nutrition: Goal: Adequate nutrition will be maintained Outcome: Progressing   Problem: Coping: Goal: Level of anxiety will decrease Outcome: Progressing   Problem: Elimination: Goal: Will not experience complications related to bowel motility Outcome: Progressing Goal: Will not experience complications related to urinary retention Outcome: Progressing   Problem: Pain Managment: Goal: General experience of comfort will improve Outcome: Progressing   Problem: Safety: Goal: Ability to remain free from injury will improve Outcome: Progressing   Problem: Skin Integrity: Goal: Risk for impaired skin integrity will decrease Outcome: Progressing

## 2022-06-29 NOTE — Progress Notes (Signed)
Mobility Specialist - Progress Note   06/29/22 1501  Mobility  Activity Ambulated with assistance in hallway;Stood at bedside;Dangled on edge of bed  Level of Assistance Standby assist, set-up cues, supervision of patient - no hands on  Assistive Device None  Distance Ambulated (ft) 100 ft  Activity Response Tolerated well  $Mobility charge 1 Mobility   Pt supine in bed on RA upon arrival. Pt STS and ambulates in hallway SBA (No hands on). Pt returns to bed with needs in reach and visitor in room.   Gretchen Short  Mobility Specialist  06/29/22 3:03 PM

## 2022-06-30 LAB — CBC
HCT: 33.9 % — ABNORMAL LOW (ref 36.0–46.0)
Hemoglobin: 10.8 g/dL — ABNORMAL LOW (ref 12.0–15.0)
MCH: 30.3 pg (ref 26.0–34.0)
MCHC: 31.9 g/dL (ref 30.0–36.0)
MCV: 95.2 fL (ref 80.0–100.0)
Platelets: 119 10*3/uL — ABNORMAL LOW (ref 150–400)
RBC: 3.56 MIL/uL — ABNORMAL LOW (ref 3.87–5.11)
RDW: 14.6 % (ref 11.5–15.5)
WBC: 8.3 10*3/uL (ref 4.0–10.5)
nRBC: 0 % (ref 0.0–0.2)

## 2022-06-30 LAB — BASIC METABOLIC PANEL
Anion gap: 6 (ref 5–15)
BUN: 14 mg/dL (ref 8–23)
CO2: 24 mmol/L (ref 22–32)
Calcium: 7.7 mg/dL — ABNORMAL LOW (ref 8.9–10.3)
Chloride: 101 mmol/L (ref 98–111)
Creatinine, Ser: 0.66 mg/dL (ref 0.44–1.00)
GFR, Estimated: >60 mL/min (ref >60)
Glucose, Bld: 75 mg/dL (ref 70–99)
Potassium: 3.9 mmol/L (ref 3.5–5.1)
Sodium: 131 mmol/L — ABNORMAL LOW (ref 135–145)

## 2022-06-30 NOTE — Progress Notes (Signed)
End of shift note:  IVF were stopped per MD order. Pt is tolerating PO intake and had good output. Two small BM today. Pt ambulating independently now. Abdominal site are clean, dry and intact. VSS. PRN norco given for pain

## 2022-06-30 NOTE — Progress Notes (Signed)
Mobility Specialist - Progress Note   06/30/22 0900  Mobility  Activity Ambulated independently in hallway  Level of Assistance Independent  Assistive Device None  Distance Ambulated (ft) 160 ft  Activity Response Tolerated well  $Mobility charge 1 Mobility   Pt EOB on RA upon arrival. Pt STS and ambulates 1 lap around NS indep. Pt's gait vastly improved from previous session. Pt returns to EOB with needs in reach.   Gretchen Short  Mobility Specialist  06/30/22 9:04 AM

## 2022-06-30 NOTE — TOC Initial Note (Signed)
Transition of Care [facility]) - Initial/Assessment Note    Patient Details  Name: [name] MRN: [id number] Date of Birth: 02-Apr-1946  Transition of Care [facility]) CM/SW Contact:    Beverly Sessions, RN Phone Number: 06/30/2022, 10:50 AM  Clinical Narrative:                  Transition of Care [facility]) Screening Note   Patient Details  Name: [name] Date of Birth: 02/06/1946   Transition of Care [facility]) CM/SW Contact:    Beverly Sessions, RN Phone Number: 06/30/2022, 10:50 AM    Transition of Care Department [facility]) has reviewed patient and no TOC needs have been identified at this time. We will continue to monitor patient advancement through interdisciplinary progression rounds. If new patient transition needs arise, please place a TOC consult.          Patient Goals and CMS Choice        Expected Discharge Plan and Services                                                Prior Living Arrangements/Services                       Activities of Daily Living Home Assistive Devices/Equipment: Hearing aid ADL Screening (condition at time of admission) Patient's cognitive ability adequate to safely complete daily activities?: Yes Is the patient deaf or have difficulty hearing?: Yes Does the patient have difficulty seeing, even when wearing glasses/contacts?: No Does the patient have difficulty concentrating, remembering, or making decisions?: No Patient able to express need for assistance with ADLs?: Yes Does the patient have difficulty dressing or bathing?: No Independently performs ADLs?: Yes (appropriate for developmental age) Does the patient have difficulty walking or climbing stairs?: No Weakness of Legs: None Weakness of Arms/Hands: None  Permission Sought/Granted                  Emotional Assessment              Admission diagnosis:  Intussusception of intestine ([place]) [K56.1] Patient Active Problem List   Diagnosis Date  Noted   Intussusception of intestine ([place]) 06/28/2022   Infected sebaceous cyst 06/14/2022   Lung nodule 05/01/2022   Jaw pain 04/17/2022   Abnormal chest x-ray 04/17/2022   Dysuria 03/09/2022   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   Varicose veins of bilateral lower extremities with other complications 92/08/9416   Leg weakness, bilateral 01/31/2022   Vomiting without nausea 01/31/2022   Vitamin D deficiency 01/31/2022   Burning tongue 01/31/2022   Tightness of neck 01/31/2022   High vitamin D level 07/05/2021   Hypothyroidism, unspecified 07/02/2020   Wart 07/02/2020   Dermatitis 02/20/2020   Polyp of transverse colon    Gastric polyp    Stomach irritation    Healthcare maintenance 06/30/2019   Upper back pain 04/14/2018   Aortic atherosclerosis ([place]) 05/31/2017   Cancer of lower lobe of right lung ([place]) 05/06/2016   Thyroid cancer ([place]) 09/11/2015   Thrombocytopenia ([place]) 07/03/2013   Osteopenia 05/05/2013   GERD (gastroesophageal reflux disease) 04/26/2012   Essential hypertension 11/17/2010   HLD (hyperlipidemia) 01/31/2008   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

## 2022-06-30 NOTE — Progress Notes (Signed)
Mobility Specialist - Progress Note   06/30/22 1325  Mobility  Activity Ambulated independently in hallway;Stood at bedside;Dangled on edge of bed  Level of Assistance Independent  Assistive Device None  Distance Ambulated (ft) 160 ft  Activity Response Tolerated well  $Mobility charge 1 Mobility   Pt supine in bed on RA upon arrival. Pt STS and ambulates 1 lap around NS indep. Pt returns to bed with needs in reach and family in room.   Gretchen Short  Mobility Specialist  06/30/22 1:30 PM

## 2022-06-30 NOTE — Progress Notes (Signed)
Patient ID: [name], female   DOB: 08/15/1946, 76 y.o.   MRN: [id number]     [place] Hospital Day(s): 2.   Interval History: Patient seen and examined, no acute events or new complaints overnight. Patient reports feeling better this morning.  She still does not think that she is passing gas.  Denies having bowel movement.  Denies any nausea or vomiting.  Tolerated full liquids  Vital signs in last 24 hours: [min-max] current  Temp:  [97.7 F (36.5 C)-98.7 F (37.1 C)] 97.7 F (36.5 C) (08/31 0810) Pulse Rate:  [61-64] 63 (08/31 0810) Resp:  [18-20] 18 (08/31 0810) BP: (87-114)/(50-57) 114/57 (08/31 0810) SpO2:  [94 %-100 %] 94 % (08/31 0810)     Height: 5\' 4"  (162.6 cm) Weight: 56.7 kg BMI (Calculated): 21.45   Physical Exam:  Constitutional: alert, cooperative and no distress  Respiratory: breathing non-labored at rest  Cardiovascular: regular rate and sinus rhythm  Gastrointestinal: soft, non-tender, and non-distended  Labs:     Latest Ref Rng & Units 06/30/2022    7:27 AM 06/29/2022    6:09 AM 06/26/2022    6:09 AM  CBC  WBC 4.0 - 10.5 K/uL 8.3  10.5  5.6   Hemoglobin 12.0 - 15.0 g/dL 10.8  11.9  13.7   Hematocrit 36.0 - 46.0 % 33.9  36.8  42.6   Platelets 150 - 400 K/uL 119  160  133       Latest Ref Rng & Units 06/30/2022    7:27 AM 06/29/2022    6:09 AM 06/26/2022    6:09 AM  CMP  Glucose 70 - 99 mg/dL 75  104  119   BUN 8 - 23 mg/dL 14  11  15    Creatinine 0.44 - 1.00 mg/dL 0.66  0.72  0.70   Sodium 135 - 145 mmol/L 131  134  141   Potassium 3.5 - 5.1 mmol/L 3.9  4.0  3.6   Chloride 98 - 111 mmol/L 101  104  104   CO2 22 - 32 mmol/L 24  25  29    Calcium 8.9 - 10.3 mg/dL 7.7  8.0  8.9   Total Protein 6.5 - 8.1 g/dL   6.8   Total Bilirubin 0.3 - 1.2 mg/dL   1.2   Alkaline Phos 38 - 126 U/L   48   AST 15 - 41 U/L   25   ALT 0 - 44 U/L   26     Imaging studies: No new pertinent imaging studies   Assessment/Plan:  76 y.o. female with small  bowel intussusception 2 Day Post-Op s/p small bowel resection.  Patient recovering slowly but adequately -Vital signs within normal limits.  No fever -Still not having bowel movement or passing gas.  We will advance diet to soft diet.  She is tolerating full liquids. -Encourage the patient to ambulate -Labs shows normal white blood cell count, stable hemoglobin. -We will continue with DVT prophylaxis  Arnold Long, MD

## 2022-06-30 NOTE — Progress Notes (Signed)
Mobility Specialist - Progress Note   06/30/22 1542  Mobility  Activity Ambulated independently in hallway;Stood at bedside;Dangled on edge of bed  Level of Assistance Independent  Assistive Device None  Distance Ambulated (ft) 240 ft  Activity Response Tolerated well  $Mobility charge 1 Mobility   Pt supine in bed on RA upon arrival. Pt STS and ambulates in hallway indep. Pt returns to bed with needs in reach and family in room.   Gretchen Short  Mobility Specialist  06/30/22 3:43 PM

## 2022-07-01 ENCOUNTER — Encounter: Admission: RE | Payer: Self-pay | Source: Ambulatory Visit

## 2022-07-01 ENCOUNTER — Ambulatory Visit: Admission: RE | Admit: 2022-07-01 | Payer: PPO | Source: Ambulatory Visit | Admitting: Gastroenterology

## 2022-07-01 LAB — SURGICAL PATHOLOGY

## 2022-07-01 SURGERY — COLONOSCOPY WITH PROPOFOL
Anesthesia: General

## 2022-07-01 MED ORDER — HYDROCODONE-ACETAMINOPHEN 5-325 MG PO TABS: 1.0000 | ORAL_TABLET | ORAL | 0 refills | Status: DC | PRN

## 2022-07-01 NOTE — Discharge Instructions (Signed)

## 2022-07-01 NOTE — Progress Notes (Signed)
Discharge instructions reviewed with patient. All questions were addressed\. Marland Kitchen PIV removed with no complications. Patient discharging home with her sister.

## 2022-07-01 NOTE — Discharge Summary (Signed)
Patient ID: [name] MRN: [id number] DOB/AGE: Jun 24, 1946 76 y.o.  Admit date: 06/28/2022 Discharge date: 07/01/2022   Discharge Diagnoses:  Principal Problem:   Intussusception of intestine [facility])   Procedures: Robotic assist laparoscopic small bowel resection  Hospital Course: Patient with chronic intussusception with abdominal pain.  She underwent robotic assisted laparoscopic small bowel resection.  She was found with a small mass of the small bowel.  Small bowel resection with anastomosis was done.  The patient has been recovering adequately.  The patient is tolerating diet.  The patient is ambulating.  Pain controlled.  Wounds are healing well.  Physical Exam Vitals reviewed.  HENT:     Head: Normocephalic.  Cardiovascular:     Rate and Rhythm: Normal rate and regular rhythm.  Pulmonary:     Effort: Pulmonary effort is normal.     Breath sounds: Normal breath sounds.  Abdominal:     General: Abdomen is flat. Bowel sounds are normal.     Palpations: Abdomen is soft.  Musculoskeletal:     Cervical back: Normal range of motion.  Neurological:     General: No focal deficit present.     Mental Status: She is alert and oriented to person, place, and time.      Consults: None  Disposition: Discharge disposition: 01-Home or Self Care       Discharge Instructions     Diet - low sodium heart healthy   Complete by: As directed    Increase activity slowly   Complete by: As directed       Allergies as of 07/01/2022       Reactions   Atorvastatin    REACTION: myalgias   Ciprofloxacin    REACTION: increase reflux   Epinephrine    Heart racing with dental injection   Metoprolol Succinate Swelling   Swelling and stiffness in hands and ankles   Nortriptyline    Intolerant but not allergy   Simvastatin Other (See Comments)   Muscle aches and stiffness   Sulfa Antibiotics Other (See Comments)   REACTION: itching        Medication List     TAKE these  medications    acetaminophen 500 MG tablet Commonly known as: TYLENOL Take 1,000 mg by mouth every 6 (six) hours as needed.   ascorbic acid 500 MG tablet Commonly known as: VITAMIN C Take 2,000 mg by mouth daily. Reported on 02/04/2016   aspirin EC 81 MG tablet Take 1 tablet (81 mg total) by mouth daily.   Biotin 1000 MCG tablet Take 1,000 mcg by mouth daily. Reported on 02/04/2016   cephALEXin 500 MG capsule Commonly known as: KEFLEX Take 1 capsule (500 mg total) by mouth 3 (three) times daily.   Coenzyme Q10 200 MG capsule Take 100 mg by mouth daily.   diclofenac Sodium 1 % Gel Commonly known as: Voltaren Arthritis Pain Apply 2 g topically 4 (four) times daily as needed.   HYDROcodone-acetaminophen 5-325 MG tablet Commonly known as: NORCO/VICODIN Take 1 tablet by mouth every 4 (four) hours as needed for moderate pain.   levothyroxine 75 MCG tablet Commonly known as: Synthroid Take 1 tablet (75 mcg total) by mouth daily before breakfast.   Lutein 20 MG Caps Take 20 mg by mouth daily.   nortriptyline 10 MG capsule Commonly known as: PAMELOR Take 10 mg by mouth at bedtime.   ondansetron 4 MG disintegrating tablet Commonly known as: ZOFRAN-ODT Take 1 tablet (4 mg total) by mouth every 8 (eight)  hours as needed for up to 5 days.   ondansetron 4 MG tablet Commonly known as: ZOFRAN Take 1 tablet (4 mg total) by mouth every 8 (eight) hours as needed for nausea or vomiting.   pantoprazole 40 MG tablet Commonly known as: PROTONIX TAKE 1 TABLET BY MOUTH EVERY DAY   Potassium Gluconate 595 MG Caps Take 595 mg by mouth daily.   pravastatin 40 MG tablet Commonly known as: PRAVACHOL TAKE 1 TABLET BY MOUTH EVERY DAY        Follow-up Information     Herbert Pun, MD Follow up in 2 week(s).   Specialty: General Surgery Why: Follow up after small bowel resection Contact information: [place]  [id number] [phone]

## 2022-07-05 ENCOUNTER — Other Ambulatory Visit: Payer: PPO

## 2022-07-05 ENCOUNTER — Telehealth: Payer: Self-pay | Admitting: *Deleted

## 2022-07-05 NOTE — Anesthesia Postprocedure Evaluation (Signed)
Anesthesia Post Note  Patient: [name]  Procedure(s) Performed: XI ROBOTIC ASSISTED SMALL BOWEL RESECTION (Abdomen)  Patient location during evaluation: PACU Anesthesia Type: General Level of consciousness: awake and alert Pain management: pain level controlled Vital Signs Assessment: post-procedure vital signs reviewed and stable Respiratory status: spontaneous breathing, nonlabored ventilation, respiratory function stable and patient connected to nasal cannula oxygen Cardiovascular status: blood pressure returned to baseline and stable Postop Assessment: no apparent nausea or vomiting Anesthetic complications: no   No notable events documented.   Last Vitals:  Vitals:   07/01/22 0511 07/01/22 0817  BP: (!) 112/57 134/76  Pulse: 71 81  Resp: 15 18  Temp: 36.7 C 36.6 C  SpO2: 100% 95%    Last Pain:  Vitals:   07/01/22 0945  TempSrc:   PainSc: [place] Brianah Hopson

## 2022-07-05 NOTE — Patient Outreach (Signed)
  Care Coordination [facility] Note Transition Care Management Unsuccessful Follow-up Telephone Call  Date of discharge and from where:  [facility] [phone]  Attempts:  1st Attempt  Reason for unsuccessful TCM follow-up call:  Left voice message  [place] Management [phone]

## 2022-07-06 ENCOUNTER — Emergency Department: Payer: PPO

## 2022-07-06 ENCOUNTER — Inpatient Hospital Stay: Payer: PPO | Admitting: Anesthesiology

## 2022-07-06 ENCOUNTER — Other Ambulatory Visit: Payer: Self-pay

## 2022-07-06 ENCOUNTER — Encounter
Admission: EM | Disposition: A | Discharge status: Home / Self Care | Payer: Self-pay | Source: Home / Self Care | Attending: General Surgery

## 2022-07-06 ENCOUNTER — Encounter: Payer: Self-pay | Admitting: General Surgery

## 2022-07-06 ENCOUNTER — Inpatient Hospital Stay
Admission: EM | Admit: 2022-07-06 | Discharge: 2022-07-14 | DRG: 351 | Disposition: A | Discharge status: Home / Self Care | Payer: PPO | Attending: General Surgery | Admitting: General Surgery

## 2022-07-06 DIAGNOSIS — Z881 Allergy status to other antibiotic agents status: Secondary | ICD-10-CM

## 2022-07-06 DIAGNOSIS — Z8041 Family history of malignant neoplasm of ovary: Secondary | ICD-10-CM | POA: Diagnosis not present

## 2022-07-06 DIAGNOSIS — K4190 Unilateral femoral hernia, without obstruction or gangrene, not specified as recurrent: Secondary | ICD-10-CM | POA: Diagnosis not present

## 2022-07-06 DIAGNOSIS — Y838 Other surgical procedures as the cause of abnormal reaction of the patient, or of later complication, without mention of misadventure at the time of the procedure: Secondary | ICD-10-CM | POA: Diagnosis not present

## 2022-07-06 DIAGNOSIS — R110 Nausea: Secondary | ICD-10-CM | POA: Diagnosis not present

## 2022-07-06 DIAGNOSIS — Z9842 Cataract extraction status, left eye: Secondary | ICD-10-CM | POA: Diagnosis not present

## 2022-07-06 DIAGNOSIS — Z8249 Family history of ischemic heart disease and other diseases of the circulatory system: Secondary | ICD-10-CM | POA: Diagnosis not present

## 2022-07-06 DIAGNOSIS — Z90710 Acquired absence of both cervix and uterus: Secondary | ICD-10-CM

## 2022-07-06 DIAGNOSIS — Z9841 Cataract extraction status, right eye: Secondary | ICD-10-CM

## 2022-07-06 DIAGNOSIS — I700 Atherosclerosis of aorta: Secondary | ICD-10-CM | POA: Diagnosis not present

## 2022-07-06 DIAGNOSIS — R1110 Vomiting, unspecified: Secondary | ICD-10-CM | POA: Diagnosis not present

## 2022-07-06 DIAGNOSIS — Z9049 Acquired absence of other specified parts of digestive tract: Secondary | ICD-10-CM

## 2022-07-06 DIAGNOSIS — K4130 Unilateral femoral hernia, with obstruction, without gangrene, not specified as recurrent: Secondary | ICD-10-CM | POA: Diagnosis present

## 2022-07-06 DIAGNOSIS — R112 Nausea with vomiting, unspecified: Secondary | ICD-10-CM | POA: Diagnosis not present

## 2022-07-06 DIAGNOSIS — E785 Hyperlipidemia, unspecified: Secondary | ICD-10-CM | POA: Diagnosis present

## 2022-07-06 DIAGNOSIS — Z85118 Personal history of other malignant neoplasm of bronchus and lung: Secondary | ICD-10-CM

## 2022-07-06 DIAGNOSIS — K567 Ileus, unspecified: Secondary | ICD-10-CM | POA: Diagnosis not present

## 2022-07-06 DIAGNOSIS — K56609 Unspecified intestinal obstruction, unspecified as to partial versus complete obstruction: Secondary | ICD-10-CM | POA: Diagnosis not present

## 2022-07-06 DIAGNOSIS — Z79899 Other long term (current) drug therapy: Secondary | ICD-10-CM

## 2022-07-06 DIAGNOSIS — I951 Orthostatic hypotension: Secondary | ICD-10-CM | POA: Diagnosis not present

## 2022-07-06 DIAGNOSIS — T797XXA Traumatic subcutaneous emphysema, initial encounter: Secondary | ICD-10-CM | POA: Diagnosis not present

## 2022-07-06 DIAGNOSIS — Z79890 Hormone replacement therapy (postmenopausal): Secondary | ICD-10-CM | POA: Diagnosis not present

## 2022-07-06 DIAGNOSIS — K4131 Unilateral femoral hernia, with obstruction, without gangrene, recurrent: Secondary | ICD-10-CM | POA: Diagnosis not present

## 2022-07-06 DIAGNOSIS — Z85850 Personal history of malignant neoplasm of thyroid: Secondary | ICD-10-CM | POA: Diagnosis not present

## 2022-07-06 DIAGNOSIS — Z7982 Long term (current) use of aspirin: Secondary | ICD-10-CM | POA: Diagnosis not present

## 2022-07-06 DIAGNOSIS — Z823 Family history of stroke: Secondary | ICD-10-CM | POA: Diagnosis not present

## 2022-07-06 DIAGNOSIS — R109 Unspecified abdominal pain: Secondary | ICD-10-CM | POA: Diagnosis not present

## 2022-07-06 DIAGNOSIS — R079 Chest pain, unspecified: Secondary | ICD-10-CM | POA: Diagnosis not present

## 2022-07-06 DIAGNOSIS — Z888 Allergy status to other drugs, medicaments and biological substances status: Secondary | ICD-10-CM | POA: Diagnosis not present

## 2022-07-06 DIAGNOSIS — L7632 Postprocedural hematoma of skin and subcutaneous tissue following other procedure: Secondary | ICD-10-CM | POA: Diagnosis not present

## 2022-07-06 DIAGNOSIS — K219 Gastro-esophageal reflux disease without esophagitis: Secondary | ICD-10-CM | POA: Diagnosis present

## 2022-07-06 DIAGNOSIS — K4100 Bilateral femoral hernia, with obstruction, without gangrene, not specified as recurrent: Principal | ICD-10-CM | POA: Diagnosis present

## 2022-07-06 DIAGNOSIS — R9431 Abnormal electrocardiogram [ECG] [EKG]: Secondary | ICD-10-CM | POA: Diagnosis not present

## 2022-07-06 DIAGNOSIS — K6389 Other specified diseases of intestine: Secondary | ICD-10-CM | POA: Diagnosis not present

## 2022-07-06 HISTORY — PX: INSERTION OF MESH: SHX5868

## 2022-07-06 LAB — CBC
HCT: 44.4 % (ref 36.0–46.0)
Hemoglobin: 14.7 g/dL (ref 12.0–15.0)
MCH: 30.5 pg (ref 26.0–34.0)
MCHC: 33.1 g/dL (ref 30.0–36.0)
MCV: 92.1 fL (ref 80.0–100.0)
Platelets: 246 10*3/uL (ref 150–400)
RBC: 4.82 MIL/uL (ref 3.87–5.11)
RDW: 14.2 % (ref 11.5–15.5)
WBC: 12.2 10*3/uL — ABNORMAL HIGH (ref 4.0–10.5)
nRBC: 0 % (ref 0.0–0.2)

## 2022-07-06 LAB — COMPREHENSIVE METABOLIC PANEL
ALT: 35 U/L (ref 0–44)
AST: 26 U/L (ref 15–41)
Albumin: 4.5 g/dL (ref 3.5–5.0)
Alkaline Phosphatase: 58 U/L (ref 38–126)
Anion gap: 11 (ref 5–15)
BUN: 16 mg/dL (ref 8–23)
CO2: 30 mmol/L (ref 22–32)
Calcium: 9.9 mg/dL (ref 8.9–10.3)
Chloride: 94 mmol/L — ABNORMAL LOW (ref 98–111)
Creatinine, Ser: 0.79 mg/dL (ref 0.44–1.00)
GFR, Estimated: >60 mL/min (ref >60)
Glucose, Bld: 139 mg/dL — ABNORMAL HIGH (ref 70–99)
Potassium: 3.9 mmol/L (ref 3.5–5.1)
Sodium: 135 mmol/L (ref 135–145)
Total Bilirubin: 1.3 mg/dL — ABNORMAL HIGH (ref 0.3–1.2)
Total Protein: 7.6 g/dL (ref 6.5–8.1)

## 2022-07-06 LAB — URINALYSIS, ROUTINE W REFLEX MICROSCOPIC
Bilirubin Urine: NEGATIVE
Glucose, UA: NEGATIVE mg/dL
Hgb urine dipstick: NEGATIVE
Ketones, ur: 5 mg/dL — AB
Leukocytes,Ua: NEGATIVE
Nitrite: NEGATIVE
Protein, ur: NEGATIVE mg/dL
Specific Gravity, Urine: 1.041 — ABNORMAL HIGH (ref 1.005–1.030)
pH: 8 (ref 5.0–8.0)

## 2022-07-06 LAB — LIPASE, BLOOD: Lipase: 27 U/L (ref 11–51)

## 2022-07-06 LAB — TROPONIN I (HIGH SENSITIVITY): Troponin I (High Sensitivity): 6 ng/L (ref <18)

## 2022-07-06 SURGERY — HERNIORRHAPHY, INGUINAL, ROBOT-ASSISTED, LAPAROSCOPIC
Anesthesia: General | Laterality: Right

## 2022-07-06 MED ORDER — SUCCINYLCHOLINE CHLORIDE 200 MG/10ML IV SOSY
PREFILLED_SYRINGE | INTRAVENOUS | Status: DC | PRN
  Administered 2022-07-06: 140 mg via INTRAVENOUS

## 2022-07-06 MED ORDER — ONDANSETRON 4 MG PO TBDP
4.0000 mg | ORAL_TABLET | Freq: Four times a day (QID) | ORAL | Status: DC | PRN
Start: 2022-07-06 — End: 2022-07-06

## 2022-07-06 MED ORDER — IOHEXOL 300 MG/ML  SOLN
80.0000 mL | Freq: Once | INTRAMUSCULAR | Status: AC | PRN
  Administered 2022-07-06: 100 mL via INTRAVENOUS

## 2022-07-06 MED ORDER — ONDANSETRON 4 MG PO TBDP
4.0000 mg | ORAL_TABLET | Freq: Four times a day (QID) | ORAL | Status: DC | PRN
  Administered 2022-07-11 (×2): 4 mg via ORAL
  Filled 2022-07-06 (×2): qty 1

## 2022-07-06 MED ORDER — SODIUM CHLORIDE FLUSH 0.9 % IV SOLN
INTRAVENOUS | Status: AC
  Filled 2022-07-06: qty 10

## 2022-07-06 MED ORDER — PROMETHAZINE HCL 25 MG/ML IJ SOLN
12.5000 mg | Freq: Four times a day (QID) | INTRAMUSCULAR | Status: DC | PRN
  Administered 2022-07-06 – 2022-07-08 (×5): 12.5 mg via INTRAVENOUS
  Filled 2022-07-06: qty 0.5
  Filled 2022-07-06: qty 12.5
  Filled 2022-07-06: qty 0.5
  Filled 2022-07-06: qty 12.5
  Filled 2022-07-06: qty 0.5

## 2022-07-06 MED ORDER — ONDANSETRON HCL 4 MG/2ML IJ SOLN
4.0000 mg | Freq: Four times a day (QID) | INTRAMUSCULAR | Status: DC | PRN
  Administered 2022-07-06 – 2022-07-13 (×8): 4 mg via INTRAVENOUS
  Filled 2022-07-06 (×8): qty 2

## 2022-07-06 MED ORDER — CEFAZOLIN SODIUM-DEXTROSE 2-4 GM/100ML-% IV SOLN
2.0000 g | INTRAVENOUS | Status: AC
  Administered 2022-07-06: 2 g via INTRAVENOUS

## 2022-07-06 MED ORDER — ACETAMINOPHEN 325 MG PO TABS
650.0000 mg | ORAL_TABLET | Freq: Four times a day (QID) | ORAL | Status: DC | PRN
  Administered 2022-07-12 – 2022-07-14 (×3): 650 mg via ORAL
  Filled 2022-07-06 (×4): qty 2

## 2022-07-06 MED ORDER — FENTANYL CITRATE (PF) 100 MCG/2ML IJ SOLN
25.0000 ug | INTRAMUSCULAR | Status: DC | PRN
  Administered 2022-07-06 (×3): 25 ug via INTRAVENOUS

## 2022-07-06 MED ORDER — ONDANSETRON HCL 4 MG/2ML IJ SOLN: 4.0000 mg | Freq: Once | INTRAMUSCULAR | Status: DC | PRN

## 2022-07-06 MED ORDER — FENTANYL CITRATE (PF) 100 MCG/2ML IJ SOLN
INTRAMUSCULAR | Status: AC
  Filled 2022-07-06: qty 2

## 2022-07-06 MED ORDER — HYDROCODONE-ACETAMINOPHEN 5-325 MG PO TABS
1.0000 | ORAL_TABLET | ORAL | Status: DC | PRN
  Administered 2022-07-07 – 2022-07-09 (×5): 2 via ORAL
  Administered 2022-07-10: 1 via ORAL
  Filled 2022-07-06: qty 1
  Filled 2022-07-06 (×5): qty 2

## 2022-07-06 MED ORDER — CHLORHEXIDINE GLUCONATE 0.12 % MT SOLN
15.0000 mL | Freq: Once | OROMUCOSAL | Status: AC
  Administered 2022-07-06: 15 mL via OROMUCOSAL

## 2022-07-06 MED ORDER — ACETAMINOPHEN 325 MG PO TABS: 650.0000 mg | ORAL_TABLET | Freq: Four times a day (QID) | ORAL | Status: DC | PRN

## 2022-07-06 MED ORDER — ENOXAPARIN SODIUM 40 MG/0.4ML IJ SOSY
40.0000 mg | PREFILLED_SYRINGE | INTRAMUSCULAR | Status: DC
  Administered 2022-07-06 – 2022-07-08 (×3): 40 mg via SUBCUTANEOUS
  Filled 2022-07-06 (×3): qty 0.4

## 2022-07-06 MED ORDER — SODIUM CHLORIDE 0.9 % IV SOLN: INTRAVENOUS | Status: DC

## 2022-07-06 MED ORDER — MORPHINE SULFATE (PF) 4 MG/ML IV SOLN: 4.0000 mg | INTRAVENOUS | Status: DC | PRN

## 2022-07-06 MED ORDER — LACTATED RINGERS IV SOLN: INTRAVENOUS | Status: DC

## 2022-07-06 MED ORDER — LEVOTHYROXINE SODIUM 50 MCG PO TABS: 75.0000 ug | ORAL_TABLET | Freq: Every day | ORAL | Status: DC

## 2022-07-06 MED ORDER — BUPIVACAINE-EPINEPHRINE 0.25% -1:200000 IJ SOLN
INTRAMUSCULAR | Status: DC | PRN
  Administered 2022-07-06: 30 mL

## 2022-07-06 MED ORDER — INDOCYANINE GREEN 25 MG IV SOLR
INTRAVENOUS | Status: DC | PRN
  Administered 2022-07-06: 5 mg via INTRAVENOUS

## 2022-07-06 MED ORDER — HYDROCODONE-ACETAMINOPHEN 5-325 MG PO TABS: 1.0000 | ORAL_TABLET | ORAL | Status: DC | PRN

## 2022-07-06 MED ORDER — PANTOPRAZOLE SODIUM 40 MG IV SOLR: 40.0000 mg | Freq: Every day | INTRAVENOUS | Status: DC

## 2022-07-06 MED ORDER — ENOXAPARIN SODIUM 40 MG/0.4ML IJ SOSY
40.0000 mg | PREFILLED_SYRINGE | INTRAMUSCULAR | Status: DC
  Administered 2022-07-06: 40 mg via SUBCUTANEOUS

## 2022-07-06 MED ORDER — FENTANYL CITRATE (PF) 100 MCG/2ML IJ SOLN
INTRAMUSCULAR | Status: AC
  Administered 2022-07-06: 25 ug via INTRAVENOUS
  Filled 2022-07-06: qty 2

## 2022-07-06 MED ORDER — ONDANSETRON 4 MG PO TBDP
4.0000 mg | ORAL_TABLET | Freq: Once | ORAL | Status: AC | PRN
  Administered 2022-07-06: 4 mg via ORAL
  Filled 2022-07-06: qty 1

## 2022-07-06 MED ORDER — SCOPOLAMINE 1 MG/3DAYS TD PT72
MEDICATED_PATCH | TRANSDERMAL | Status: AC
  Filled 2022-07-06: qty 1

## 2022-07-06 MED ORDER — SCOPOLAMINE 1 MG/3DAYS TD PT72
1.0000 | MEDICATED_PATCH | TRANSDERMAL | Status: DC
  Administered 2022-07-06: 1.5 mg via TRANSDERMAL

## 2022-07-06 MED ORDER — IOHEXOL 350 MG/ML SOLN: 100.0000 mL | Freq: Once | INTRAVENOUS | Status: DC | PRN

## 2022-07-06 MED ORDER — DEXAMETHASONE SODIUM PHOSPHATE 10 MG/ML IJ SOLN
INTRAMUSCULAR | Status: DC | PRN
  Administered 2022-07-06: 10 mg via INTRAVENOUS

## 2022-07-06 MED ORDER — CHLORHEXIDINE GLUCONATE 0.12 % MT SOLN
OROMUCOSAL | Status: AC
  Filled 2022-07-06: qty 15

## 2022-07-06 MED ORDER — SODIUM CHLORIDE 0.9 % IV BOLUS
500.0000 mL | Freq: Once | INTRAVENOUS | Status: AC
  Administered 2022-07-06: 500 mL via INTRAVENOUS

## 2022-07-06 MED ORDER — SUGAMMADEX SODIUM 200 MG/2ML IV SOLN
INTRAVENOUS | Status: DC | PRN
  Administered 2022-07-06: 200 mg via INTRAVENOUS

## 2022-07-06 MED ORDER — FENTANYL CITRATE (PF) 100 MCG/2ML IJ SOLN
INTRAMUSCULAR | Status: DC | PRN
  Administered 2022-07-06 (×4): 50 ug via INTRAVENOUS

## 2022-07-06 MED ORDER — BUPIVACAINE-EPINEPHRINE (PF) 0.25% -1:200000 IJ SOLN
INTRAMUSCULAR | Status: AC
  Filled 2022-07-06: qty 30

## 2022-07-06 MED ORDER — ACETAMINOPHEN 10 MG/ML IV SOLN
INTRAVENOUS | Status: AC
  Filled 2022-07-06: qty 100

## 2022-07-06 MED ORDER — ENOXAPARIN SODIUM 40 MG/0.4ML IJ SOSY
PREFILLED_SYRINGE | INTRAMUSCULAR | Status: AC
  Filled 2022-07-06: qty 0.4

## 2022-07-06 MED ORDER — ACETAMINOPHEN 650 MG RE SUPP: 650.0000 mg | Freq: Four times a day (QID) | RECTAL | Status: DC | PRN

## 2022-07-06 MED ORDER — CEFAZOLIN SODIUM-DEXTROSE 2-4 GM/100ML-% IV SOLN
INTRAVENOUS | Status: AC
  Filled 2022-07-06: qty 100

## 2022-07-06 MED ORDER — MORPHINE SULFATE (PF) 4 MG/ML IV SOLN
4.0000 mg | INTRAVENOUS | Status: DC | PRN
  Administered 2022-07-06 – 2022-07-07 (×3): 4 mg via INTRAVENOUS
  Filled 2022-07-06 (×3): qty 1

## 2022-07-06 MED ORDER — ROCURONIUM BROMIDE 100 MG/10ML IV SOLN
INTRAVENOUS | Status: DC | PRN
  Administered 2022-07-06: 25 mg via INTRAVENOUS
  Administered 2022-07-06: 20 mg via INTRAVENOUS
  Administered 2022-07-06: 5 mg via INTRAVENOUS
  Administered 2022-07-06 (×2): 20 mg via INTRAVENOUS

## 2022-07-06 MED ORDER — ONDANSETRON HCL 4 MG/2ML IJ SOLN
4.0000 mg | Freq: Four times a day (QID) | INTRAMUSCULAR | Status: DC | PRN
Start: 2022-07-06 — End: 2022-07-06

## 2022-07-06 MED ORDER — PROPOFOL 10 MG/ML IV BOLUS
INTRAVENOUS | Status: DC | PRN
  Administered 2022-07-06: 100 mg via INTRAVENOUS

## 2022-07-06 MED ORDER — ACETAMINOPHEN 10 MG/ML IV SOLN
INTRAVENOUS | Status: DC | PRN
  Administered 2022-07-06: 1000 mg via INTRAVENOUS

## 2022-07-06 MED ORDER — ORAL CARE MOUTH RINSE: 15.0000 mL | Freq: Once | OROMUCOSAL | Status: AC

## 2022-07-06 MED ORDER — ONDANSETRON HCL 4 MG/2ML IJ SOLN
INTRAMUSCULAR | Status: DC | PRN
  Administered 2022-07-06: 4 mg via INTRAVENOUS

## 2022-07-06 MED ORDER — PANTOPRAZOLE SODIUM 40 MG IV SOLR
40.0000 mg | Freq: Every day | INTRAVENOUS | Status: DC
  Administered 2022-07-06 – 2022-07-11 (×6): 40 mg via INTRAVENOUS
  Filled 2022-07-06 (×6): qty 10

## 2022-07-06 MED ORDER — ONDANSETRON HCL 4 MG/2ML IJ SOLN
4.0000 mg | Freq: Once | INTRAMUSCULAR | Status: AC
  Administered 2022-07-06: 4 mg via INTRAVENOUS
  Filled 2022-07-06: qty 2

## 2022-07-06 MED ORDER — LIDOCAINE HCL (CARDIAC) PF 100 MG/5ML IV SOSY
PREFILLED_SYRINGE | INTRAVENOUS | Status: DC | PRN
  Administered 2022-07-06: 50 mg via INTRAVENOUS

## 2022-07-06 MED ORDER — LEVOTHYROXINE SODIUM 50 MCG PO TABS
75.0000 ug | ORAL_TABLET | Freq: Every day | ORAL | Status: DC
  Administered 2022-07-07 – 2022-07-14 (×8): 75 ug via ORAL
  Filled 2022-07-06 (×8): qty 2

## 2022-07-06 SURGICAL SUPPLY — 51 items
ADH SKN CLS APL DERMABOND .7 (GAUZE/BANDAGES/DRESSINGS) ×1
BAG PRESSURE INF REUSE 1000 (BAG) IMPLANT
BLADE SURG SZ11 CARB STEEL (BLADE) ×2 IMPLANT
COVER TIP SHEARS 8 DVNC (MISCELLANEOUS) ×2 IMPLANT
COVER TIP SHEARS 8MM DA VINCI (MISCELLANEOUS) ×2
COVER WAND RF STERILE (DRAPES) ×2 IMPLANT
DERMABOND ADVANCED (GAUZE/BANDAGES/DRESSINGS) ×2
DERMABOND ADVANCED .7 DNX12 (GAUZE/BANDAGES/DRESSINGS) ×2 IMPLANT
DRAPE ARM DVNC X/XI (DISPOSABLE) ×6 IMPLANT
DRAPE COLUMN DVNC XI (DISPOSABLE) ×2 IMPLANT
DRAPE DA VINCI XI ARM (DISPOSABLE) ×6
DRAPE DA VINCI XI COLUMN (DISPOSABLE) ×2
ELECT REM PT RETURN 9FT ADLT (ELECTROSURGICAL) ×2
ELECTRODE REM PT RTRN 9FT ADLT (ELECTROSURGICAL) ×2 IMPLANT
GLOVE BIO SURGEON STRL SZ 6.5 (GLOVE) ×4 IMPLANT
GLOVE BIOGEL PI IND STRL 6.5 (GLOVE) ×4 IMPLANT
GOWN STRL REUS W/ TWL LRG LVL3 (GOWN DISPOSABLE) ×6 IMPLANT
GOWN STRL REUS W/TWL LRG LVL3 (GOWN DISPOSABLE) ×6
IRRIGATOR SUCT 8 DISP DVNC XI (IRRIGATION / IRRIGATOR) IMPLANT
IRRIGATOR SUCTION 8MM XI DISP (IRRIGATION / IRRIGATOR)
IV CATH ANGIO 12GX3 LT BLUE (NEEDLE) IMPLANT
IV NS 1000ML (IV SOLUTION)
IV NS 1000ML BAXH (IV SOLUTION) IMPLANT
KIT IMAGING PINPOINTPAQ (MISCELLANEOUS) IMPLANT
KIT PINK PAD W/HEAD ARE REST (MISCELLANEOUS) ×2 IMPLANT
KIT PINK PAD W/HEAD ARM REST (MISCELLANEOUS) ×2 IMPLANT
LABEL OR SOLS (LABEL) IMPLANT
MANIFOLD NEPTUNE II (INSTRUMENTS) ×2 IMPLANT
MESH 3DMAX MID 5X7 LT XLRG (Mesh General) IMPLANT
MESH 3DMAX MID 5X7 RT XLRG (Mesh General) IMPLANT
NDL INSUFFLATION 14GA 120MM (NEEDLE) ×2 IMPLANT
NEEDLE HYPO 22GX1.5 SAFETY (NEEDLE) ×2 IMPLANT
NEEDLE INSUFFLATION 14GA 120MM (NEEDLE) ×2 IMPLANT
OBTURATOR OPTICAL STANDARD 8MM (TROCAR) ×2
OBTURATOR OPTICAL STND 8 DVNC (TROCAR) ×2
OBTURATOR OPTICALSTD 8 DVNC (TROCAR) ×2 IMPLANT
PACK LAP CHOLECYSTECTOMY (MISCELLANEOUS) ×2 IMPLANT
SEAL CANN UNIV 5-8 DVNC XI (MISCELLANEOUS) ×6 IMPLANT
SEAL XI 5MM-8MM UNIVERSAL (MISCELLANEOUS) ×6
SET TUBE SMOKE EVAC HIGH FLOW (TUBING) ×2 IMPLANT
SOLUTION ELECTROLUBE (MISCELLANEOUS) ×2 IMPLANT
SUT MNCRL 4-0 (SUTURE) ×2
SUT MNCRL 4-0 27XMFL (SUTURE) ×2
SUT VIC AB 2-0 SH 27 (SUTURE) ×2
SUT VIC AB 2-0 SH 27XBRD (SUTURE) ×2 IMPLANT
SUT VLOC 90 S/L VL9 GS22 (SUTURE) ×2 IMPLANT
SUTURE MNCRL 4-0 27XMF (SUTURE) ×2 IMPLANT
TAPE TRANSPORE STRL 2 31045 (GAUZE/BANDAGES/DRESSINGS) IMPLANT
TRAP FLUID SMOKE EVACUATOR (MISCELLANEOUS) ×2 IMPLANT
TRAY FOLEY MTR SLVR 16FR STAT (SET/KITS/TRAYS/PACK) ×2 IMPLANT
WATER STERILE IRR 500ML POUR (IV SOLUTION) ×2 IMPLANT

## 2022-07-06 NOTE — Op Note (Signed)
Preoperative diagnosis: Recurrent right femoral hernia with obstruction.                                           Left femoral hernia  Postoperative diagnosis: Same  Procedure:  -Robotic assisted Laparoscopic Transabdominal preperitoneal laparoscopic (TAPP) repair of right recurrent femoral hernia with obstruction -Robotic assisted laparoscopic transabdominal preperitoneal laparoscopic repair of left femoral hernia.  Anesthesia: GETA  Surgeon: Dr. Windell Moment  Wound Classification: Clean  Indications:  Patient is a 76 y.o. female developed a nausea and vomiting seen this morning.  CT scan shows right femoral hernia with obstruction.  Patient with previous history of femoral hernia repair.  Also with left femoral hernia with high risk of incarceration.  Emergent repair was indicated.  Findings: 1.  Recurrent right femoral hernia with small bowel incarcerated and obstructed 2.  Left femoral hernia without complication. 3. Bard Extra Large 3D Max MID Anatomical mesh used for repair 4. Adequate hemostasis.              Description of procedure: The patient was taken to the operating room and the correct side of surgery was verified. The patient was placed supine with arms tucked at the sides. After obtaining adequate anesthesia, the patient's abdomen was prepped and draped in standard sterile fashion. The patient was placed in the Trendelenburg position. A time-out was completed verifying correct patient, procedure, site, positioning, and implant(s) and/or special equipment prior to beginning this procedure. A Veress needle was placed at the umbilicus and pneumoperitoneum created with insufflation of carbon dioxide to 15 mmHg. After the Veress needle was removed, an 8-mm trocar was placed on epigastric area and the 30 angled laparoscope inserted. Two 8-mm trocars were then placed lateral to the rectus sheath under direct visualization. Both inguinal regions were inspected and the  median umbilical ligament, medial umbilical ligament, and lateral umbilical fold were identified.  The robotic arms were docked. The robotic scope was inserted and the pelvic area anatomy targeted.  The small bowel was reduced from the right femoral hernia.  The small bowel loops with mild ecchymosis but with good peristalsis and no sign of necrosis.  ICG green administered to the patient and small bowel shows adequate ICG perfusion. The peritoneum of the right groin area was incised with scissors along a line 6 cm above the superior edge of the hernia defect, extending from the median umbilical ligament to the anterior superior iliac spine. The peritoneal flap was mobilized inferiorly using blunt and sharp dissection. The inferior epigastric vessels were exposed and the pubic symphysis was identified. Cooper's ligament was dissected to its junction with the iliac vein.  There was a very difficult dissection due to scar tissue from previous hernia repair.  The hernia was identified and reduced by gentle traction.  The hernia sac was noted mobilized from the cord structures and reduced into the peritoneal cavity.  An extra large piece of mesh was rolled longitudinally into a compact cylinder and passed through a trocar. The cylinder was placed along the inferior aspect of the working space and unrolled into place to completely cover the direct, indirect, and femoral spaces. The mesh was secured into place superiorly to the anterior abdominal wall and inferiorly and medially to Cooper's ligament with absorbable sutures. Care was taken to avoid the inferolateral triangles containing the iliac vessels and genital nerves. The peritoneal flap was  closed over the mesh and secured with suture in similar positions of safety.  The left femoral hernia was repair using the same technique. After ensuring adequate hemostasis, the trocars were removed and the pneumoperitoneum allowed to escape. The trocar incisions were  closed using monocryl and skin adhesive dressings applied.  The patient tolerated the procedure well and was taken to the postanesthesia care unit in stable condition.   Specimen: None  Complications: None  Estimated Blood Loss: 25 mL

## 2022-07-06 NOTE — Anesthesia Procedure Notes (Signed)
Procedure Name: Intubation Date/Time: 07/06/2022 11:35 AM  Performed by: Rolla Plate, CRNAPre-anesthesia Checklist: Patient identified, Patient being monitored, Timeout performed, Emergency Drugs available and Suction available Patient Re-evaluated:Patient Re-evaluated prior to induction Oxygen Delivery Method: Circle system utilized Preoxygenation: Pre-oxygenation with 100% oxygen Induction Type: IV induction, Rapid sequence and Cricoid Pressure applied Laryngoscope Size: 3 and McGraph Grade View: Grade I Tube type: Oral Tube size: 6.5 mm Number of attempts: 1 Airway Equipment and Method: Stylet and Video-laryngoscopy Placement Confirmation: ETT inserted through vocal cords under direct vision, positive ETCO2 and breath sounds checked- equal and bilateral Secured at: 20 cm Tube secured with: Tape Dental Injury: Teeth and Oropharynx as per pre-operative assessment  Comments: NGT to sxn prior to induction, OP clear, ETT placed atraumatically

## 2022-07-06 NOTE — ED Provider Notes (Signed)
[facility] Provider Note    Event Date/Time   First MD Initiated Contact with Patient 07/06/22 0827     (approximate)   History   Emesis   HPI  [name] is a 76 y.o. female with history of bowel resection who was discharged on Friday who comes in with concerns for vomiting.  Patient does report having a normal bowel movement and does report having some cramping in her lower abdomen.  I reviewed the discharge summary from 9/1.  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  Patient reports that she was doing well but then last night started having some vomiting.  She reports that she did have 1 bowel movement that was not normal in caliber but she has not really been able to eat since the surgery.  She reports a lot of lower abdominal cramping associated with this.  She reports some burning and discomfort up into her chest and her neck but she reports that to the vomiting.  Physical Exam   Triage Vital Signs: ED Triage Vitals  Enc Vitals Group     BP 07/06/22 0757 (!) 141/85     Pulse Rate 07/06/22 0757 95     Resp 07/06/22 0757 18     Temp 07/06/22 0757 98.1 F (36.7 C)     Temp Source 07/06/22 0757 Oral     SpO2 07/06/22 0757 96 %     Weight 07/06/22 0758 123 lb 7.3 oz (56 kg)     Height 07/06/22 0758 5\' 4"  (1.626 m)     Head Circumference --      Peak Flow --      Pain Score 07/06/22 0758 7     Pain Loc --      Pain Edu? --      Excl. in [place]? --     Most recent vital signs: Vitals:   07/06/22 0757  BP: (!) 141/85  Pulse: 95  Resp: 18  Temp: 98.1 F (36.7 C)  SpO2: 96%     General: Awake, no distress.  CV:  Good peripheral perfusion.  Resp:  Normal effort.  Abd:  No distention.  Multiple laparoscopic wounds noted with no erythema or redness.  No  significant tenderness but does feel little distended on examination. Other:     ED Results / Procedures / Treatments   Labs (all labs ordered are listed, but only abnormal results are displayed) Labs Reviewed  CBC - Abnormal; Notable for the following components:      Result Value   WBC 12.2 (*)    All other components within normal limits  LIPASE, BLOOD  COMPREHENSIVE METABOLIC PANEL  URINALYSIS, ROUTINE W REFLEX MICROSCOPIC     EKG  My interpretation of EKG:  Normal sinus rate of 79, no ST elevation or T wave inversions, normal intervals  RADIOLOGY I have reviewed the CT personally and interpreted and patient has small bowel obstruction secondary to femoral hernia   PROCEDURES:  Critical Care performed: No  Procedures   MEDICATIONS ORDERED IN ED: Medications  ondansetron (ZOFRAN-ODT) disintegrating tablet 4 mg (4 mg Oral Given 07/06/22 0802)  sodium chloride 0.9 % bolus 500 mL (500 mLs Intravenous New Bag/Given 07/06/22 0908)  ondansetron (ZOFRAN) injection 4 mg (4 mg Intravenous Given 07/06/22 0909)  iohexol (OMNIPAQUE)  300 MG/ML solution 80 mL (100 mLs Intravenous Contrast Given 07/06/22 0918)     IMPRESSION / MDM / ASSESSMENT AND PLAN / ED COURSE  I reviewed the triage vital signs and the nursing notes.   Patient's presentation is most consistent with acute presentation with potential threat to life or bodily function.   Differential includes perforation, obstruction, postop infection.  Will get CT imaging to further evaluate give patient some fluids and IV Zofran.  Given the pain up into her chest will get EKG, troponin, chest x-ray to make sure nothing wrong with her lungs or her heart.    CBC shows slightly elevated white count.  Troponin is negative lipase normal CMP slightly low chloride patient getting some IV fluid.  CT imaging consistent with small bowel obstruction with femoral hernia with incarceration.  I discussed the case with Dr. Peyton Najjar he is  going to come down and see the patient.  Patient will be admitted to the surgery team and I will order an NG tube  The patient is on the cardiac monitor to evaluate for evidence of arrhythmia and/or significant heart rate changes.      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   Vanessa Panama, MD 07/06/22 [phone]

## 2022-07-06 NOTE — Anesthesia Preprocedure Evaluation (Signed)
Anesthesia Evaluation  Patient identified by MRN, date of birth, ID band Patient awake    Reviewed: Allergy & Precautions, H&P , NPO status , Patient's Chart, lab work & pertinent test results, reviewed documented beta blocker date and time   History of Anesthesia Complications (+) PONV and history of anesthetic complications  Airway Mallampati: II  TM Distance: >3 FB Neck ROM: full    Dental  (+) Teeth Intact   Pulmonary neg pulmonary ROS,    Pulmonary exam normal        Cardiovascular Exercise Tolerance: Good hypertension, negative cardio ROS Normal cardiovascular exam Rhythm:regular Rate:Normal     Neuro/Psych negative neurological ROS  negative psych ROS   GI/Hepatic Neg liver ROS, GERD  Medicated,  Endo/Other  Hypothyroidism   Renal/GU negative Renal ROS  negative genitourinary   Musculoskeletal   Abdominal   Peds  Hematology negative hematology ROS (+)   Anesthesia Other Findings Past Medical History: 10/01/2015: Adenocarcinoma of right lung (HCC) No date: GERD (gastroesophageal reflux disease) No date: GIB (gastrointestinal bleeding) No date: Groin pain     Comment:  Along superior/laterl R inguinal canal.  Could be due to              hernia or old scar tissue.  prev with CT done w/o               alarming findings.  Will treat episodically.  Use tylenol              #3 prn.   No date: Hyperlipidemia No date: Lipoma of thigh     Comment:  Left 2014: Normal cardiac stress test No date: PONV (postoperative nausea and vomiting)     Comment:  nausea after hysterectomy, and 2 days after lung surgery              10/2015 12/2015: Thyroid cancer ([place]) No date: Thyroid nodule Past Surgical History: No date: ABDOMINAL HYSTERECTOMY 1977: BREAST EXCISIONAL BIOPSY; Right 1977: BREAST MASS EXCISION     Comment:  benign 1978: CARPAL TUNNEL RELEASE; Right No date: CATARACT EXTRACTION, BILATERAL;  Bilateral 11/23/2016: CHOLECYSTECTOMY     Comment:  Procedure: LAPAROSCOPIC CHOLECYSTECTOMY;  Surgeon:               Clayburn Pert, MD;  Location: [facility];  Service:               General;; 2009: COLONOSCOPY 05/18/2019: COLONOSCOPY WITH PROPOFOL; N/A     Comment:  Procedure: COLONOSCOPY WITH PROPOFOL;  Surgeon:               Virgel Manifold, MD;  Location: [facility];                Service: Endoscopy;  Laterality: N/A; 08/15/2019: COLONOSCOPY WITH PROPOFOL; N/A     Comment:  Procedure: COLONOSCOPY WITH PROPOFOL;  Surgeon:               Virgel Manifold, MD;  Location: [facility];                Service: Endoscopy;  Laterality: N/A; 2001: [place] OF UTERUS 08/25/2015: ELECTROMAGNETIC NAVIGATION BROCHOSCOPY; N/A     Comment:  Procedure: ELECTROMAGNETIC NAVIGATION BRONCHOSCOPY;                Surgeon: Flora Lipps, MD;  Location: [facility];  Service:               Cardiopulmonary;  Laterality: N/A; 08/25/2015: ENDOBRONCHIAL  ULTRASOUND; N/A     Comment:  Procedure: ENDOBRONCHIAL ULTRASOUND;  Surgeon: Flora Lipps, MD;  Location: [facility];  Service: Cardiopulmonary;              Laterality: N/A; 2002, 11/15/07: ESOPHAGOGASTRODUODENOSCOPY     Comment:  Normal (Dr. Allyn Kenner) 05/18/2019: ESOPHAGOGASTRODUODENOSCOPY (EGD) WITH PROPOFOL; N/A     Comment:  Procedure: ESOPHAGOGASTRODUODENOSCOPY (EGD) WITH               PROPOFOL;  Surgeon: Virgel Manifold, MD;  Location:               [facility];  Service: Endoscopy;  Laterality: N/A; 08/15/2019: ESOPHAGOGASTRODUODENOSCOPY (EGD) WITH PROPOFOL; N/A     Comment:  Procedure: ESOPHAGOGASTRODUODENOSCOPY (EGD) WITH               PROPOFOL;  Surgeon: Virgel Manifold, MD;  Location:               [facility];  Service: Endoscopy;  Laterality: N/A; 01/13/2010: FACIAL COSMETIC SURGERY     Comment:  mini facelift 1976: HERNIA REPAIR No date: OOPHORECTOMY 12/08/2021: PTOSIS REPAIR 10/27/2015:  THORACOTOMY/LOBECTOMY; Right     Comment:  Procedure: THORACOTOMY/LOBECTOMY;  Surgeon: Nestor Lewandowsky, MD;  Location: [facility];  Service: Thoracic;                Laterality: Right; 12/29/2015: THYROIDECTOMY; N/A     Comment:  Procedure: THYROIDECTOMY;  Surgeon: Beverly Gust, MD;              Location: [facility];  Service: ENT;  Laterality: N/A; 12/30/2008: TOTAL VAGINAL HYSTERECTOMY     Comment:  for fibroid pain (Dr. Gertie Fey) 06/28/2022: XI ROBOTIC ASSISTED SMALL BOWEL RESECTION; N/A     Comment:  Procedure: XI ROBOTIC ASSISTED SMALL BOWEL RESECTION;                Surgeon: Herbert Pun, MD;  Location: [facility];               Service: General;  Laterality: N/A; BMI    Body Mass Index: 21.19 kg/m     Reproductive/Obstetrics negative OB ROS                             Anesthesia Physical Anesthesia Plan  ASA: 2 and emergent  Anesthesia Plan: General ETT   Post-op Pain Management:    Induction:   PONV Risk Score and Plan: 4 or greater  Airway Management Planned:   Additional Equipment:   Intra-op Plan:   Post-operative Plan:   Informed Consent: I have reviewed the patients History and Physical, chart, labs and discussed the procedure including the risks, benefits and alternatives for the proposed anesthesia with the patient or authorized representative who has indicated his/her understanding and acceptance.     Dental Advisory Given  Plan Discussed with: CRNA  Anesthesia Plan Comments:         Anesthesia Quick Evaluation

## 2022-07-06 NOTE — Transfer of Care (Signed)
Immediate Anesthesia Transfer of Care Note  Patient: [name]  Procedure(s) Performed: XI ROBOTIC ASSISTED INGUINAL HERNIA- femoral hernia right vs. bilateral (Right) INDOCYANINE GREEN FLUORESCENCE IMAGING (ICG) INSERTION OF MESH  Patient Location: PACU  Anesthesia Type:General  Level of Consciousness: awake and alert   Airway & Oxygen Therapy: Patient Spontanous Breathing and Patient connected to face mask oxygen  Post-op Assessment: Report given to RN and Post -op Vital signs reviewed and stable  Post vital signs: Reviewed  Last Vitals:  Vitals Value Taken Time  BP 152/72 07/06/22 1341  Temp    Pulse 92 07/06/22 1343  Resp 25 07/06/22 1343  SpO2 100 % 07/06/22 1343  Vitals shown include unvalidated device data.  Last Pain:  Vitals:   07/06/22 1037  TempSrc: Temporal  PainSc:          Complications: No notable events documented.

## 2022-07-06 NOTE — ED Triage Notes (Signed)
Pt reports recent bowel resection and was discharged Friday. Pt reports that last night she began vomiting. Has vomited approx 6 times. One normal BM. Denies fever. Reports cramping pain to lower abd.

## 2022-07-06 NOTE — ED Notes (Signed)
NG tube placed by this Nurse with Paramedic Les at side. Pt tolerated well. Gastric content suctioned  and placement verified by auscultation.Upon completion  OR  technician at bedside to transport for Emergency surgery. NG Tube taped at the 55 mark.

## 2022-07-06 NOTE — H&P (Signed)
SURGICAL CONSULTATION NOTE   HISTORY OF PRESENT ILLNESS (HPI):  76 y.o. female presented to [facility] ED for evaluation of nausea and vomiting. Patient reports she has been having nausea and vomiting since last night.  She also endorses having intermittent nausea and vomiting since surgery.  There has been some abdominal pain described as cramping.  There is no radiation.  Aggravating factor is applying pressure.  There is no alleviating factors.  Today the pain is localized right groin area.  She had a CT scan of the abdomen and pelvis that shows an incarcerated femoral hernia with obstruction.  I personally evaluated the images.  There is no free air or fluid.  Surgery is consulted by Dr. Jari Pigg in this context for evaluation and management of incarcerated femoral hernia with obstruction.  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  Could be due to hernia or old scar tissue.  prev with CT done w/o alarming findings.  Will treat episodically.  Use tylenol #3 prn.     Hyperlipidemia    Lipoma of thigh    Left   Normal cardiac stress test 2014   PONV (postoperative nausea and vomiting)    nausea after hysterectomy, and 2 days after lung surgery 10/2015   Thyroid cancer ([place]) 12/2015   Thyroid nodule      PAST SURGICAL HISTORY ([place]):  Past Surgical History:  Procedure Laterality Date   ABDOMINAL HYSTERECTOMY     BREAST EXCISIONAL BIOPSY Right [place]   benign   CARPAL TUNNEL RELEASE Right 1978   CATARACT EXTRACTION, BILATERAL Bilateral    CHOLECYSTECTOMY  11/23/2016   Procedure: LAPAROSCOPIC CHOLECYSTECTOMY;  Surgeon: Clayburn Pert, MD;  Location: [facility] ORS;  Service: General;;   COLONOSCOPY  2009   COLONOSCOPY WITH PROPOFOL N/A 05/18/2019   Procedure:  COLONOSCOPY WITH PROPOFOL;  Surgeon: Virgel Manifold, MD;  Location: [facility] ENDOSCOPY;  Service: Endoscopy;  Laterality: N/A;   COLONOSCOPY WITH PROPOFOL N/A 08/15/2019   Procedure: COLONOSCOPY WITH PROPOFOL;  Surgeon: Virgel Manifold, MD;  Location: [facility] ENDOSCOPY;  Service: Endoscopy;  Laterality: N/A;   DILATION AND CURETTAGE OF UTERUS  2001   ELECTROMAGNETIC NAVIGATION BROCHOSCOPY N/A 08/25/2015   Procedure: ELECTROMAGNETIC NAVIGATION BRONCHOSCOPY;  Surgeon: Flora Lipps, MD;  Location: [facility] ORS;  Service: Cardiopulmonary;  Laterality: N/A;   ENDOBRONCHIAL ULTRASOUND N/A 08/25/2015   Procedure: ENDOBRONCHIAL ULTRASOUND;  Surgeon: Flora Lipps, MD;  Location: [facility] ORS;  Service: Cardiopulmonary;  Laterality: N/A;   ESOPHAGOGASTRODUODENOSCOPY  2002, 11/15/07   Normal (Dr. Allyn Kenner)   ESOPHAGOGASTRODUODENOSCOPY (EGD) WITH PROPOFOL N/A 05/18/2019   Procedure: ESOPHAGOGASTRODUODENOSCOPY (EGD) WITH PROPOFOL;  Surgeon: Virgel Manifold, MD;  Location: [facility] END[facility];  Service: Endoscopy;  Laterality: N/A;   ESOPHAGOGASTRODUODENOSCOPY (EGD) WITH PROPOFOL N/A 08/15/2019   Procedure: ESOPHAGOGASTRODUODENOSCOPY (EGD) WITH PROPOFOL;  Surgeon: Virgel Manifold, MD;  Location: [facility] END[facility];  Service: Endoscopy;  Laterality: N/A;   FACIAL COSMETIC SURGERY  01/13/2010   mini facelift   HERNIA REPAIR  1976   OOPHORECTOMY     PTOSIS REPAIR  12/08/2021   THORACOTOMY/LOBECTOMY Right 10/27/2015   Procedure: THORACOTOMY/LOBECTOMY;  Surgeon: Nestor Lewandowsky, MD;  Location: [place] [facility];  Service: Thoracic;  Laterality: Right;   THYROIDECTOMY N/A 12/29/2015   Procedure: THYROIDECTOMY;  Surgeon: Freda Munro  Tami Ribas, MD;  Location: [facility] OR[facility];  Service: ENT;  Laterality: N/A;   TOTAL VAGINAL HYSTERECTOMY  12/30/2008   for fibroid pain (Dr. Gertie Fey)   XI ROBOTIC ASSISTED SMALL BOWEL RESECTION N/A 06/28/2022   Procedure: XI ROBOTIC ASSISTED SMALL BOWEL RESECTION;  Surgeon: Herbert Pun, MD;  Location: [facility]  ORS;  Service: General;  Laterality: N/A;     MEDICATIONS:  Prior to Admission medications   Medication Sig Start Date End Date Taking? Authorizing Provider  acetaminophen (TYLENOL) 500 MG tablet Take 1,000 mg by mouth every 6 (six) hours as needed.    [provider]  Ascorbic Acid (VITAMIN C) 500 MG tablet Take 2,000 mg by mouth daily. Reported on 02/04/2016    [provider]  aspirin EC 81 MG tablet Take 1 tablet (81 mg total) by mouth daily. 11/29/19   Tonia Ghent, MD  Biotin 1000 MCG tablet Take 1,000 mcg by mouth daily. Reported on 02/04/2016    [provider]  cephALEXin (KEFLEX) 500 MG capsule Take 1 capsule (500 mg total) by mouth 3 (three) times daily. Patient not taking: Reported on 06/28/2022 06/14/22   Viviana Simpler I, MD  Coenzyme Q10 200 MG capsule Take 100 mg by mouth daily.    [provider]  diclofenac Sodium (VOLTAREN ARTHRITIS PAIN) 1 % GEL Apply 2 g topically 4 (four) times daily as needed. 05/27/22   Tonia Ghent, MD  HYDROcodone-acetaminophen (NORCO/VICODIN) 5-325 MG tablet Take 1 tablet by mouth every 4 (four) hours as needed for moderate pain. 07/01/22   Herbert Pun, MD  levothyroxine (SYNTHROID) 75 MCG tablet Take 1 tablet (75 mcg total) by mouth daily before breakfast. 04/28/22   Cammie Sickle, MD  Lutein 20 MG CAPS Take 20 mg by mouth daily.    [provider]  nortriptyline (PAMELOR) 10 MG capsule Take 10 mg by mouth at bedtime. Patient not taking: Reported on 06/29/2022 03/30/22   [provider]  ondansetron (ZOFRAN) 4 MG tablet Take 1 tablet (4 mg total) by mouth every 8 (eight) hours as needed for nausea or vomiting. 06/23/22   Lin Landsman, MD  pantoprazole (PROTONIX) 40 MG tablet TAKE 1 TABLET BY MOUTH EVERY DAY 05/24/22   Tonia Ghent, MD  Potassium Gluconate 595 MG CAPS Take 595 mg by mouth daily.     [provider]  pravastatin (PRAVACHOL) 40 MG tablet TAKE 1 TABLET  BY MOUTH EVERY DAY 03/29/22   Tonia Ghent, MD  metoprolol succinate (TOPROL-XL) 25 MG 24 hr tablet Take 25 mg by mouth at bedtime.  06/29/20  [provider]     ALLERGIES:  Allergies  Allergen Reactions   Atorvastatin     REACTION: myalgias   Ciprofloxacin     REACTION: increase reflux   Epinephrine     Heart racing with dental injection   Metoprolol Succinate Swelling    Swelling and stiffness in hands and ankles   Nortriptyline     Intolerant but not allergy   Simvastatin Other (See Comments)    Muscle aches and stiffness   Sulfa Antibiotics Other (See Comments)    REACTION: itching     SOCIAL HISTORY:  Social History   Socioeconomic History   Marital status: Married    Spouse name: Not on file   Number of children: Not on file   Years of education: Not on file   Highest education level: Not on file  Occupational History   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  He has short term memory loss.     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   Previously had an adopted son who died in 2020/01/10.   Social Determinants of Health   Financial Resource Strain: Low Risk  (12/13/2021)   Overall Financial Resource Strain (CARDIA)    Difficulty of Paying Living Expenses: Not hard at all  Food Insecurity: No Food Insecurity (12/13/2021)   Hunger Vital Sign    Worried About Running Out of Food in the Last Year: Never true    Ran Out of Food in the Last Year: Never true  Transportation Needs: No Transportation Needs (06/10/2020)   PRAPARE - Hydrologist  (Medical): No    Lack of Transportation (Non-Medical): No  Physical Activity: Sufficiently Active (06/10/2020)   Exercise Vital Sign    Days of Exercise per Week: 3 days    Minutes of Exercise per Session: 60 min  Stress: No Stress Concern Present (06/10/2020)   [place]    Feeling of Stress : Not at all  Social Connections: Not on file  Intimate Partner Violence: Not At Risk (06/10/2020)   Humiliation, Afraid, Rape, and Kick questionnaire    Fear of Current or Ex-Partner: No    Emotionally Abused: No    Physically Abused: No    Sexually Abused: No      FAMILY HISTORY:  Family History  Problem Relation Age of Onset   Transient ischemic attack Mother    Hypertension Mother    Stroke Mother        mini strokes   Hypertension Sister    Cancer Sister        ovarian   Hypertension Sister    Hypertension Sister    Hypertension Sister    Breast cancer Neg Hx    Colon cancer Neg Hx      REVIEW OF SYSTEMS:  Constitutional: denies weight loss, fever, chills, or sweats  Eyes: denies any other vision changes, history of eye injury  ENT: denies sore throat, hearing problems  Respiratory: denies shortness of breath, wheezing  Cardiovascular: denies chest pain, palpitations  Gastrointestinal: Positive abdominal pain, nausea and vomiting Genitourinary: denies burning with urination or urinary frequency Musculoskeletal: denies any other joint pains or cramps  Skin: denies any other rashes or skin discolorations  Neurological: denies any other headache, dizziness, weakness  Psychiatric: denies any other depression, anxiety   All other review of systems were negative   VITAL SIGNS:  Temp:  [98.1 F (36.7 C)] 98.1 F (36.7 C) (09/06 0757) Pulse Rate:  [95] 95 (09/06 0757) Resp:  [18] 18 (09/06 0757) BP: (141)/(85) 141/85 (09/06 0757) SpO2:  [96 %] 96 % (09/06 0757) Weight:  [56 kg] 56 kg (09/06 0758)     Height:  5\' 4"  (162.6 cm) Weight: 56 kg BMI (Calculated): 21.18   INTAKE/OUTPUT:  This shift: No intake/output data recorded.  Last 2 shifts: @IOLAST2SHIFTS @   PHYSICAL  EXAM:  Constitutional:  -- Normal body habitus  -- Awake, alert, and oriented x3  Eyes:  -- Pupils equally round and reactive to light  -- No scleral icterus  Ear, nose, and throat:  -- No jugular venous distension  Pulmonary:  -- No crackles  -- Equal breath sounds bilaterally -- Breathing non-labored at rest Cardiovascular:  -- S1, S2 present  -- No pericardial rubs Gastrointestinal:  -- Abdomen soft, nontender, non-distended, no guarding or rebound tenderness.  Unable to palpate a femoral hernia. -- No abdominal masses appreciated, pulsatile or otherwise  Musculoskeletal and Integumentary:  -- Wounds: None appreciated -- Extremities: B/L UE and LE FROM, hands and feet warm, no edema  Neurologic:  -- Motor function: intact and symmetric -- Sensation: intact and symmetric   Labs:     Latest Ref Rng & Units 07/06/2022    8:01 AM 06/30/2022    7:27 AM 06/29/2022    6:09 AM  CBC  WBC 4.0 - 10.5 K/uL 12.2  8.3  10.5   Hemoglobin 12.0 - 15.0 g/dL 14.7  10.8  11.9   Hematocrit 36.0 - 46.0 % 44.4  33.9  36.8   Platelets 150 - 400 K/uL 246  119  160       Latest Ref Rng & Units 07/06/2022    8:01 AM 06/30/2022    7:27 AM 06/29/2022    6:09 AM  CMP  Glucose 70 - 99 mg/dL 139  75  104   BUN 8 - 23 mg/dL 16  14  11    Creatinine 0.44 - 1.00 mg/dL 0.79  0.66  0.72   Sodium 135 - 145 mmol/L 135  131  134   Potassium 3.5 - 5.1 mmol/L 3.9  3.9  4.0   Chloride 98 - 111 mmol/L 94  101  104   CO2 22 - 32 mmol/L 30  24  25    Calcium 8.9 - 10.3 mg/dL 9.9  7.7  8.0   Total Protein 6.5 - 8.1 g/dL 7.6     Total Bilirubin 0.3 - 1.2 mg/dL 1.3     Alkaline Phos 38 - 126 U/L 58     AST 15 - 41 U/L 26     ALT 0 - 44 U/L 35       Imaging studies:  EXAM: CT ABDOMEN AND PELVIS WITH CONTRAST   TECHNIQUE: Multidetector CT imaging  of the abdomen and pelvis was performed using the standard protocol following bolus administration of intravenous contrast.   RADIATION DOSE REDUCTION: This exam was performed according to the departmental dose-optimization program which includes automated exposure control, adjustment of the mA and/or kV according to patient size and/or use of iterative reconstruction technique.   CONTRAST:  117mL OMNIPAQUE IOHEXOL 300 MG/ML  SOLN   COMPARISON:  Abdominopelvic CT 06/26/2022 and 82,019. PET-CT 05/12/2022.   FINDINGS: Lower chest: Chronic scarring at the lung bases without superimposed airspace disease, pleural effusion or basilar pneumothorax. There is soft tissue emphysema within the right lateral chest wall.   Hepatobiliary: The liver is normal in density without suspicious focal abnormality. Low-density lesions posteriorly in the right hepatic lobe are stable. No significant biliary dilatation status post cholecystectomy.   Pancreas: Unremarkable. No pancreatic ductal dilatation or surrounding inflammatory changes.   Spleen: Stable borderline splenomegaly without focal abnormality.   Adrenals/Urinary Tract: Both adrenal glands appear normal. The kidneys appear stable without evidence of urinary tract calculus, suspicious lesion or hydronephrosis. The bladder appears unremarkable for its degree of  distention.   Stomach/Bowel: No enteric contrast was administered. The stomach is fluid-filled, but not significantly distended. No significant distension of the duodenum or proximal jejunum. However, there are multiple moderately dilated and fluid filled loops of small bowel in the pelvis which appears to be secondary to a right femoral hernia which is likely incarcerated. There is a fluid filled loop of bowel within this hernia measuring up to 2.1 cm on image 71/2. The small bowel distal to this hernia is decompressed. Small bowel anastomosis clips are present deep in the pelvis  with dilatation of the small bowel both proximal and distal to the anastomosis. The appendix appears normal. The colon is decompressed with mildly prominent stool proximally. No evidence of recurrent intussusception.   Vascular/Lymphatic: There are no enlarged abdominal or pelvic lymph nodes. Aortic and branch vessel atherosclerosis without evidence of aneurysm or large vessel occlusion.   Reproductive: Hysterectomy.  No adnexal mass.   Other: Small amount of ascites. Fluid extends into the right femoral hernia. No focal extraluminal fluid collection or pneumoperitoneum. A small amount of gas within the anterior abdominal wall is attributed to the patient's recent surgery.   Musculoskeletal: No acute or significant osseous findings. Multilevel spondylosis.   IMPRESSION: 1. High-grade distal small-bowel obstruction, now secondary to a probable incarcerated right femoral hernia. Surgical consultation recommended. 2. Postsurgical changes from recent partial small bowel resection for intussusception. No recurrent intussusception identified. 3. Ascites without pneumoperitoneum or focal extraluminal fluid collection. Scattered air within the chest and abdominal wall attributed to recent abdominal surgery. 4.  Aortic Atherosclerosis (ICD10-I70.0). 5. These results were called by telephone at the time of interpretation on 07/06/2022 at 9:45 am to provider [facility] , who verbally acknowledged these results.     Electronically Signed   By: Richardean Sale M.D.   On: 07/06/2022 09:45  Assessment/Plan:  76 y.o. female with incarcerated right femoral hernia with obstruction.  Patient with history of small bowel intussusception s/p small bowel resection 1 week ago.  She presented to the ED with nausea and vomiting abdominal cramping.  CT scan of the abdomen and pelvis shows incarcerated femoral hernia with obstruction.  Patient was oriented about diagnosis.  She was admitted with  recommendation of surgical intervention for repair of femoral hernia with possible small bowel resection if strangulated.  She was oriented about the risk of surgery includes pain, infection, bleeding, injury to intestine, bowel perforation, intra-abdominal infection, bowel obstruction, among others.  The patient reports she understood and agreed to proceed.  Arnold Long, MD

## 2022-07-06 NOTE — Plan of Care (Signed)
  Problem: Clinical Measurements: Goal: Ability to maintain clinical measurements within normal limits will improve Outcome: Progressing   

## 2022-07-06 NOTE — ED Notes (Signed)
Patient transported to CT 

## 2022-07-07 ENCOUNTER — Encounter: Payer: Self-pay | Admitting: General Surgery

## 2022-07-07 LAB — BASIC METABOLIC PANEL
Anion gap: 10 (ref 5–15)
BUN: 23 mg/dL (ref 8–23)
CO2: 24 mmol/L (ref 22–32)
Calcium: 7.6 mg/dL — ABNORMAL LOW (ref 8.9–10.3)
Chloride: 103 mmol/L (ref 98–111)
Creatinine, Ser: 1.23 mg/dL — ABNORMAL HIGH (ref 0.44–1.00)
GFR, Estimated: 45 mL/min — ABNORMAL LOW (ref >60)
Glucose, Bld: 161 mg/dL — ABNORMAL HIGH (ref 70–99)
Potassium: 4 mmol/L (ref 3.5–5.1)
Sodium: 137 mmol/L (ref 135–145)

## 2022-07-07 LAB — PHOSPHORUS: Phosphorus: 4.3 mg/dL (ref 2.5–4.6)

## 2022-07-07 LAB — CBC
HCT: 27.2 % — ABNORMAL LOW (ref 36.0–46.0)
Hemoglobin: 8.6 g/dL — ABNORMAL LOW (ref 12.0–15.0)
MCH: 30.1 pg (ref 26.0–34.0)
MCHC: 31.6 g/dL (ref 30.0–36.0)
MCV: 95.1 fL (ref 80.0–100.0)
Platelets: 287 10*3/uL (ref 150–400)
RBC: 2.86 MIL/uL — ABNORMAL LOW (ref 3.87–5.11)
RDW: 14.8 % (ref 11.5–15.5)
WBC: 16.2 10*3/uL — ABNORMAL HIGH (ref 4.0–10.5)
nRBC: 0.2 % (ref 0.0–0.2)

## 2022-07-07 LAB — MAGNESIUM: Magnesium: 1.8 mg/dL (ref 1.7–2.4)

## 2022-07-07 MED ORDER — KETOROLAC TROMETHAMINE 30 MG/ML IJ SOLN
30.0000 mg | Freq: Four times a day (QID) | INTRAMUSCULAR | Status: AC | PRN
  Administered 2022-07-07 – 2022-07-10 (×4): 30 mg via INTRAVENOUS
  Filled 2022-07-07 (×4): qty 1

## 2022-07-07 MED ORDER — PHENOL 1.4 % MT LIQD
1.0000 | OROMUCOSAL | Status: DC | PRN
Start: 2022-07-07 — End: 2022-07-14
  Filled 2022-07-07: qty 177

## 2022-07-07 MED ORDER — SODIUM CHLORIDE 0.9 % IV SOLN: INTRAVENOUS | Status: DC

## 2022-07-07 NOTE — Progress Notes (Signed)
   07/07/22 1314  Assess: MEWS Score  BP (!) 85/59  MAP (mmHg) 66  Pulse Rate (!) 114  Assess: MEWS Score  MEWS Temp 0  MEWS Systolic 1  MEWS Pulse 2  MEWS RR 0  MEWS LOC 0  MEWS Score 3  MEWS Score Color Yellow  Assess: if the MEWS score is Yellow or Red  Were vital signs taken at a resting state? No  Focused Assessment Change from prior assessment (see assessment flowsheet)  Does the patient meet 2 or more of the SIRS criteria? Yes  Does the patient have a confirmed or suspected source of infection? No  MEWS guidelines implemented *See Row Information* No, vital signs rechecked  Treat  MEWS Interventions Administered scheduled meds/treatments;Administered prn meds/treatments  Notify: Charge Nurse/RN  Name of Charge Nurse/RN Notified Meriel Pica RN  Date Charge Nurse/RN Notified 07/07/22  Time Charge Nurse/RN Notified 1330  Notify: Provider  Provider Name/Title Windell Moment  Date Provider Notified 07/07/22  Time Provider Notified 8403  Method of Notification Page  Notification Reason Critical result  Provider response See new orders  Date of Provider Response 07/07/22  Time of Provider Response 1316  Assess: SIRS CRITERIA  SIRS Temperature  0  SIRS Pulse 1  SIRS Respirations  0  SIRS WBC 1  SIRS Score Sum  2

## 2022-07-07 NOTE — Plan of Care (Signed)
  Problem: Health Behavior/Discharge Planning: Goal: Ability to manage health-related needs will improve Outcome: Progressing   Problem: Clinical Measurements: Goal: Ability to maintain clinical measurements within normal limits will improve Outcome: Progressing   

## 2022-07-07 NOTE — Care Management Important Message (Signed)
Important Message  Patient Details  Name: [name] MRN: [id number] Date of Birth: 04/14/1946   Medicare Important Message Given:  N/A - LOS <3 / Initial given by admissions     Dannette Barbara 07/07/2022, 2:11 PM

## 2022-07-07 NOTE — TOC CM/SW Note (Signed)
  Transition of Care [facility]) Screening Note   Patient Details  Name: [name] Date of Birth: 09/23/1946   Transition of Care [facility]) CM/SW Contact:    Candie Chroman, LCSW Phone Number: 07/07/2022, 1:19 PM    Transition of Care Department [facility]) has reviewed patient and no TOC needs have been identified at this time. We will continue to monitor patient advancement through interdisciplinary progression rounds. If new patient transition needs arise, please place a TOC consult.

## 2022-07-07 NOTE — Progress Notes (Signed)
Patient ID: [name], female   DOB: Jan 25, 1946, 76 y.o.   MRN: [id number]     [place] Hospital Day(s): 1.   Interval History: Patient seen and examined, no acute events or new complaints overnight. Patient reports to feel nauseated overnight.  NGT was placed back to suction.  She denies passing gas.  Pain lower abdomen.  Vital signs in last 24 hours: [min-max] current  Temp:  [96.8 F (36 C)-99 F (37.2 C)] 98 F (36.7 C) (09/07 0442) Pulse Rate:  [80-96] 95 (09/07 0442) Resp:  [9-29] 20 (09/07 0442) BP: (108-163)/(68-85) 108/68 (09/07 0442) SpO2:  [93 %-100 %] 96 % (09/07 0442) Weight:  [56 kg] 56 kg (09/06 0758)     Height: 5\' 4"  (162.6 cm) Weight: 56 kg BMI (Calculated): 21.18   Physical Exam:  Constitutional: alert, cooperative and no distress  Respiratory: breathing non-labored at rest  Cardiovascular: regular rate and sinus rhythm  Gastrointestinal: soft, mild-tender, and non-distended.  The wounds are dry and clean with bruise.  Labs:     Latest Ref Rng & Units 07/06/2022    8:01 AM 06/30/2022    7:27 AM 06/29/2022    6:09 AM  CBC  WBC 4.0 - 10.5 K/uL 12.2  8.3  10.5   Hemoglobin 12.0 - 15.0 g/dL 14.7  10.8  11.9   Hematocrit 36.0 - 46.0 % 44.4  33.9  36.8   Platelets 150 - 400 K/uL 246  119  160       Latest Ref Rng & Units 07/06/2022    8:01 AM 06/30/2022    7:27 AM 06/29/2022    6:09 AM  CMP  Glucose 70 - 99 mg/dL 139  75  104   BUN 8 - 23 mg/dL 16  14  11    Creatinine 3.49 - 1.00 mg/dL 0.79  0.66  0.72   Sodium 135 - 145 mmol/L 135  131  134   Potassium 3.5 - 5.1 mmol/L 3.9  3.9  4.0   Chloride 98 - 111 mmol/L 94  101  104   CO2 22 - 32 mmol/L 30  24  25    Calcium 8.9 - 10.3 mg/dL 9.9  7.7  8.0   Total Protein 6.5 - 8.1 g/dL 7.6     Total Bilirubin 0.3 - 1.2 mg/dL 1.3     Alkaline Phos 38 - 126 U/L 58     AST 15 - 41 U/L 26     ALT 0 - 44 U/L 35       Imaging studies: No new pertinent imaging studies   Assessment/Plan:  76 y.o.  female with bilateral femoral hernia with incarcerated right femoral hernia with obstruction 1 Day Post-Op s/p robotic assisted laparoscopic bilateral femoral hernia repair.  Patient got nauseated overnight.  NGT was placed back to suction.  I will expect ileus to resolve slowly.  Lots of reassurance was given to the patient that she is going to recover slowly but adequately.  I encouraged the patient to ambulate.  We will continue with NGT to suction until patient is feeling better.  Then we will do NGT clamp trial.  We will continue with DVT prophylaxis.  We will continue with pain management.  Arnold Long, MD

## 2022-07-07 NOTE — Progress Notes (Signed)
Mobility Specialist - Progress Note   07/07/22 1333  Mobility  Activity Stood at bedside;Transferred to/from BSC;Dangled on edge of bed  Level of Assistance Moderate assist, patient does 50-74%  Assistive Device None  Distance Ambulated (ft) 0 ft  Activity Response Tolerated well  $Mobility charge 1 Mobility   Pt sitting up in bed on RA with RN in room upon arrival. PT STS 3 times with ModA with dizziness upon standing. RN completes orthostatic BP. PT able too sit upright on EOB for without LOB EOB.  Pt transfers to [facility]. Pt left in bed with RN in room.   Gretchen Short  Mobility Specialist  07/07/22 1:38 PM

## 2022-07-08 ENCOUNTER — Inpatient Hospital Stay: Payer: PPO

## 2022-07-08 MED ORDER — MINERAL OIL PO OIL
TOPICAL_OIL | Freq: Once | ORAL | Status: AC
Start: 2022-07-08 — End: 2022-07-08
  Filled 2022-07-08 (×2): qty 30

## 2022-07-08 MED ORDER — POLYETHYLENE GLYCOL 3350 17 G PO PACK
17.0000 g | PACK | Freq: Every day | ORAL | Status: DC
  Administered 2022-07-08 – 2022-07-12 (×4): 17 g via ORAL
  Filled 2022-07-08 (×6): qty 1

## 2022-07-08 MED ORDER — MENTHOL 3 MG MT LOZG
1.0000 | LOZENGE | OROMUCOSAL | Status: DC | PRN
  Administered 2022-07-08: 3 mg via ORAL
  Filled 2022-07-08: qty 9

## 2022-07-08 NOTE — Progress Notes (Signed)
Patient ID: [name], female   DOB: October 14, 1946, 76 y.o.   MRN: [id number]     [place] Hospital Day(s): 2.   Interval History: Patient seen and examined, no acute events or new complaints overnight. Patient reports she is feeling better this morning.  She denies being lightheaded.  She denies nausea or vomiting.  She feels like she passed gas.  Abdomen is sore but no significant pain.  Vital signs in last 24 hours: [min-max] current  Temp:  [97.4 F (36.3 C)-98.2 F (36.8 C)] 98 F (36.7 C) (09/08 0458) Pulse Rate:  [90-115] 97 (09/08 0458) Resp:  [14-20] 20 (09/08 0458) BP: (85-136)/(46-96) 116/58 (09/08 0458) SpO2:  [94 %-99 %] 99 % (09/08 0458)     Height: 5\' 4"  (162.6 cm) Weight: 56 kg BMI (Calculated): 21.18   Physical Exam:  Constitutional: alert, cooperative and no distress  Respiratory: breathing non-labored at rest  Cardiovascular: regular rate and sinus rhythm  Gastrointestinal: soft, non-tender, and non-distended  Labs:     Latest Ref Rng & Units 07/07/2022    1:42 PM 07/06/2022    8:01 AM 06/30/2022    7:27 AM  CBC  WBC 4.0 - 10.5 K/uL 16.2  12.2  8.3   Hemoglobin 12.0 - 15.0 g/dL 8.6  14.7  10.8   Hematocrit 36.0 - 46.0 % 27.2  44.4  33.9   Platelets 150 - 400 K/uL 287  246  119       Latest Ref Rng & Units 07/07/2022    1:42 PM 07/06/2022    8:01 AM 06/30/2022    7:27 AM  CMP  Glucose 70 - 99 mg/dL 161  139  75   BUN 8 - 23 mg/dL 23  16  14    Creatinine 0.44 - 1.00 mg/dL 1.23  0.79  0.66   Sodium 135 - 145 mmol/L 137  135  131   Potassium 3.5 - 5.1 mmol/L 4.0  3.9  3.9   Chloride 98 - 111 mmol/L 103  94  101   CO2 22 - 32 mmol/L 24  30  24    Calcium 8.9 - 10.3 mg/dL 7.6  9.9  7.7   Total Protein 6.5 - 8.1 g/dL  7.6    Total Bilirubin 0.3 - 1.2 mg/dL  1.3    Alkaline Phos 38 - 126 U/L  58    AST 15 - 41 U/L  26    ALT 0 - 44 U/L  35      Imaging studies: No new pertinent imaging studies   Assessment/Plan:  76 y.o. female with  bilateral femoral hernia with incarcerated right femoral hernia with obstruction 2 Day Post-Op s/p robotic assisted laparoscopic bilateral femoral hernia repair.  -Discussed the patient with orthostatic hypotension when she tried to get out of bed.  She has been having stable adequate blood pressure today.  Adequate heart rate. -This morning patient feeling better without being and lightheaded. -Abdominal pain also improving. -I discontinue morphine.  We will try to control pain with Tylenol -She feels like she passed gas and she denies nausea this morning.  I will do NGT clamp trial. -We will try to get patient out of bed today.   Arnold Long, MD

## 2022-07-08 NOTE — Progress Notes (Signed)
Mobility Specialist - Progress Note    07/08/22 1537  Mobility  Activity Ambulated with assistance in hallway;Stood at bedside;Dangled on edge of bed;Ambulated with assistance to bathroom  Level of Assistance Contact guard assist, steadying assist  Distance Ambulated (ft) 80 ft  Activity Response Tolerated well  $Mobility charge 1 Mobility   Pt sitting upright on RA upon arrival. Pt STS and ambulates to doorway SBA. Pt than needs seated rest break for 3 minutes. Pt complains of dizziness put is motivated to continue walking. Pt STS and ambulates in hallway CGA + 2 for chair follow. PT able to use restroom and pericare SBA upon return to room. Pt still complaining of dizziness, RN notified. Pt returns to bed with needs in reach and family in room.   Gretchen Short  Mobility Specialist  07/08/22 3:41 PM

## 2022-07-08 NOTE — Progress Notes (Signed)
Patient verbalized feeling nauseated. Dr Windell Moment notified. Order given to re-connect NG tube. X-ray ordered

## 2022-07-08 NOTE — Progress Notes (Signed)
Mobility Specialist - Progress Note   07/08/22 1039  Mobility  Activity Ambulated with assistance in hallway;Stood at bedside;Dangled on edge of bed  Level of Assistance Contact guard assist, steadying assist  Assistive Device None  Distance Ambulated (ft) 100 ft  Activity Response Tolerated well  $Mobility charge 1 Mobility   Pt supine in bed on RA upon arrival. PT completes bed mobility MinA and STS and ambulates in hallway CGA. Pt returns to bed with needs in reach.   Gretchen Short  Mobility Specialist  07/08/22 10:42 AM

## 2022-07-09 LAB — BASIC METABOLIC PANEL
Anion gap: 8 (ref 5–15)
BUN: 16 mg/dL (ref 8–23)
CO2: 20 mmol/L — ABNORMAL LOW (ref 22–32)
Calcium: 7.7 mg/dL — ABNORMAL LOW (ref 8.9–10.3)
Chloride: 109 mmol/L (ref 98–111)
Creatinine, Ser: 0.78 mg/dL (ref 0.44–1.00)
GFR, Estimated: >60 mL/min (ref >60)
Glucose, Bld: 83 mg/dL (ref 70–99)
Potassium: 3.7 mmol/L (ref 3.5–5.1)
Sodium: 137 mmol/L (ref 135–145)

## 2022-07-09 LAB — CBC WITH DIFFERENTIAL/PLATELET
Abs Immature Granulocytes: 0.17 10*3/uL — ABNORMAL HIGH (ref 0.00–0.07)
Basophils Absolute: 0.1 10*3/uL (ref 0.0–0.1)
Basophils Relative: 1 %
Eosinophils Absolute: 0.2 10*3/uL (ref 0.0–0.5)
Eosinophils Relative: 1 %
HCT: 20.3 % — ABNORMAL LOW (ref 36.0–46.0)
Hemoglobin: 6.3 g/dL — ABNORMAL LOW (ref 12.0–15.0)
Immature Granulocytes: 1 %
Lymphocytes Relative: 15 %
Lymphs Abs: 2.5 10*3/uL (ref 0.7–4.0)
MCH: 30.3 pg (ref 26.0–34.0)
MCHC: 31 g/dL (ref 30.0–36.0)
MCV: 97.6 fL (ref 80.0–100.0)
Monocytes Absolute: 1.5 10*3/uL — ABNORMAL HIGH (ref 0.1–1.0)
Monocytes Relative: 9 %
Neutro Abs: 12.4 10*3/uL — ABNORMAL HIGH (ref 1.7–7.7)
Neutrophils Relative %: 73 %
Platelets: 218 10*3/uL (ref 150–400)
RBC: 2.08 MIL/uL — ABNORMAL LOW (ref 3.87–5.11)
RDW: 14.9 % (ref 11.5–15.5)
WBC: 16.8 10*3/uL — ABNORMAL HIGH (ref 4.0–10.5)
nRBC: 0 % (ref 0.0–0.2)

## 2022-07-09 LAB — HEMOGLOBIN AND HEMATOCRIT, BLOOD
HCT: 25.8 % — ABNORMAL LOW (ref 36.0–46.0)
Hemoglobin: 8.4 g/dL — ABNORMAL LOW (ref 12.0–15.0)

## 2022-07-09 LAB — PREPARE RBC (CROSSMATCH)

## 2022-07-09 LAB — MAGNESIUM: Magnesium: 1.9 mg/dL (ref 1.7–2.4)

## 2022-07-09 LAB — PHOSPHORUS: Phosphorus: 1.8 mg/dL — ABNORMAL LOW (ref 2.5–4.6)

## 2022-07-09 MED ORDER — SODIUM CHLORIDE 0.9% IV SOLUTION: Freq: Once | INTRAVENOUS | Status: AC

## 2022-07-09 MED ORDER — POTASSIUM PHOSPHATES 15 MMOLE/5ML IV SOLN
12.0000 mmol | Freq: Once | INTRAVENOUS | Status: AC
  Administered 2022-07-09: 12 mmol via INTRAVENOUS
  Filled 2022-07-09: qty 4

## 2022-07-09 MED ORDER — METOCLOPRAMIDE HCL 5 MG/ML IJ SOLN
5.0000 mg | Freq: Three times a day (TID) | INTRAMUSCULAR | Status: DC
  Administered 2022-07-09 – 2022-07-10 (×4): 5 mg via INTRAVENOUS
  Filled 2022-07-09 (×4): qty 2

## 2022-07-09 MED ORDER — ORAL CARE MOUTH RINSE: 15.0000 mL | OROMUCOSAL | Status: DC | PRN

## 2022-07-09 MED ORDER — FAMOTIDINE IN NACL 20-0.9 MG/50ML-% IV SOLN
20.0000 mg | Freq: Every day | INTRAVENOUS | Status: DC
  Administered 2022-07-09 – 2022-07-11 (×3): 20 mg via INTRAVENOUS
  Filled 2022-07-09 (×3): qty 50

## 2022-07-09 NOTE — Progress Notes (Signed)
Patient ID: [name], female   DOB: 1946/06/27, 76 y.o.   MRN: [id number]     [facility] Day(s): 3.   Interval History: Patient seen and examined, no acute events or new complaints overnight. Patient reports continued feeling nauseous.  Denies any pain abdominal pain.  She does feel comfortable with abdominal bloating.  She did pass gas when she sat down in the commode.  Vital signs in last 24 hours: [min-max] current  Temp:  [97.5 F (36.4 C)-99 F (37.2 C)] 97.5 F (36.4 C) (09/09 0851) Pulse Rate:  [91-106] 91 (09/09 0851) Resp:  [12-18] 18 (09/09 0851) BP: (121-134)/(58-61) 134/61 (09/09 0851) SpO2:  [94 %-98 %] 96 % (09/09 0851)     Height: 5\' 4"  (162.6 cm) Weight: 56 kg BMI (Calculated): 21.18   Physical Exam:  Constitutional: alert, cooperative and no distress  Respiratory: breathing non-labored at rest  Cardiovascular: regular rate and sinus rhythm  Gastrointestinal: soft, non-tender, and distended the wounds are dry and clean.  Bruises are resolving.  No significant crepitus.  Labs:     Latest Ref Rng & Units 07/07/2022    1:42 PM 07/06/2022    8:01 AM 06/30/2022    7:27 AM  CBC  WBC 4.0 - 10.5 K/uL 16.2  12.2  8.3   Hemoglobin 12.0 - 15.0 g/dL 8.6  14.7  10.8   Hematocrit 36.0 - 46.0 % 27.2  44.4  33.9   Platelets 150 - 400 K/uL 287  246  119       Latest Ref Rng & Units 07/07/2022    1:42 PM 07/06/2022    8:01 AM 06/30/2022    7:27 AM  CMP  Glucose 70 - 99 mg/dL 161  139  75   BUN 8 - 23 mg/dL 23  16  14    Creatinine 0.44 - 1.00 mg/dL 1.23  0.79  0.66   Sodium 135 - 145 mmol/L 137  135  131   Potassium 3.5 - 5.1 mmol/L 4.0  3.9  3.9   Chloride 98 - 111 mmol/L 103  94  101   CO2 22 - 32 mmol/L 24  30  24    Calcium 8.9 - 10.3 mg/dL 7.6  9.9  7.7   Total Protein 6.5 - 8.1 g/dL  7.6    Total Bilirubin 0.3 - 1.2 mg/dL  1.3    Alkaline Phos 38 - 126 U/L  58    AST 15 - 41 U/L  26    ALT 0 - 44 U/L  35      Imaging studies: Abdominal x-ray  yesterday shows NGT in place.  There is generalized distention of the large and small intestine.  No obstructive pattern.  (And with ileus.  I personally evaluated the images.   Assessment/Plan:  76 y.o. female with bilateral femoral hernia with incarcerated right femoral hernia with obstruction 3 Day Post-Op s/p robotic assisted laparoscopic bilateral femoral hernia repair.  -Patient continue with postoperative ileus.  Abdominal x-ray shows general dilation of the small and large intestine.  Gas in the rectum.  Patient does passing gas when she is getting the commode but not enough since she still feels nauseated and bloated. -I will add Pepcid to take at night and Reglan to see if that helps with ileus. -We will keep NGT to low intermittent suction -We will continue with IV hydration -We will continue with sips of liquids for comfort -I encouraged the patient to ambulate -Continue  DVT prophylaxis -Labs ordered to follow-up hemoglobin and electrolytes   Arnold Long, MD

## 2022-07-09 NOTE — Progress Notes (Signed)
Patient ID: [name], female   DOB: Mar 23, 1946, 76 y.o.   MRN: [id number]   Patient re evaluated this afternoon. She is laying in bed. No distress. She endorses passing gas. Still feel bloated.   Her hemoglobin today was 6.3. I ordered to transfuse 2 Units of PRBC. She is receiving the first one at this moment. Tolerating well.   Vitals:   07/09/22 1459 07/09/22 1500  BP: (!) 116/51 (!) 116/51  Pulse: 97 97  Resp:  16  Temp: 98.7 F (37.1 C) 98.7 F (37.1 C)  SpO2:  97%   Alert, oriented x3 Abdomen is soft and depressible. Mild distended.   Plan: will transfuse 2 units of RBC. Will replace phosphorus.  Will check hemoglobin post transfusion Will consider CT of the abdomen and pelvis if no clinical improvement tomorrow.   Herbert Pun, MD, FACS

## 2022-07-10 ENCOUNTER — Inpatient Hospital Stay: Payer: PPO

## 2022-07-10 LAB — TYPE AND SCREEN
ABO/RH(D): A POS
Antibody Screen: NEGATIVE
Unit division: 0
Unit division: 0
Unit division: 0
Unit division: 0
Unit division: 0

## 2022-07-10 LAB — BPAM RBC
Blood Product Expiration Date: 202309132359
Blood Product Expiration Date: 202310082359
Blood Product Expiration Date: 202310092359
Blood Product Expiration Date: 202310102359
Blood Product Expiration Date: 202310102359
ISSUE DATE / TIME: 202309091434
ISSUE DATE / TIME: 202309091734
Unit Type and Rh: 600
Unit Type and Rh: 6200
Unit Type and Rh: 6200
Unit Type and Rh: 6200
Unit Type and Rh: 6200

## 2022-07-10 LAB — HEMOGLOBIN AND HEMATOCRIT, BLOOD
HCT: 26.1 % — ABNORMAL LOW (ref 36.0–46.0)
Hemoglobin: 8.5 g/dL — ABNORMAL LOW (ref 12.0–15.0)

## 2022-07-10 LAB — PREPARE RBC (CROSSMATCH)

## 2022-07-10 MED ORDER — METOCLOPRAMIDE HCL 5 MG/ML IJ SOLN
5.0000 mg | Freq: Four times a day (QID) | INTRAMUSCULAR | Status: DC
  Administered 2022-07-10 – 2022-07-11 (×2): 5 mg via INTRAVENOUS
  Filled 2022-07-10 (×2): qty 2

## 2022-07-10 MED ORDER — IOHEXOL 9 MG/ML PO SOLN
500.0000 mL | ORAL | Status: AC
  Administered 2022-07-10 (×2): 500 mL via ORAL

## 2022-07-10 MED ORDER — IOHEXOL 300 MG/ML  SOLN
100.0000 mL | Freq: Once | INTRAMUSCULAR | Status: AC | PRN
Start: 2022-07-10 — End: 2022-07-10
  Administered 2022-07-10: 100 mL via INTRAVENOUS

## 2022-07-10 NOTE — Progress Notes (Signed)
Patient ID: [name], female   DOB: November 13, 1945, 76 y.o.   MRN: [id number]     [facility] Day(s): 4.   Interval History: Patient seen and examined, no acute events or new complaints overnight. Patient reports "feeling stronger this morning". She denies abdominal pain but continue feeling distended. Endorses passing gas but no bowel movement.   Patient received 2 units of PRBC last night. Hemoglobin increased to 8.4 from 6.3. This is adequate increase.   Vital signs in last 24 hours: [min-max] current  Temp:  [98 F (36.7 C)-99.8 F (37.7 C)] 98.4 F (36.9 C) (09/10 0746) Pulse Rate:  [76-104] 79 (09/10 0746) Resp:  [16-18] 18 (09/10 0746) BP: (110-132)/(51-64) 115/58 (09/10 0746) SpO2:  [94 %-99 %] 95 % (09/10 0746)     Height: 5\' 4"  (162.6 cm) Weight: 56 kg BMI (Calculated): 21.18   Physical Exam:  Constitutional: alert, cooperative and no distress  Respiratory: breathing non-labored at rest  Cardiovascular: regular rate and sinus rhythm  Gastrointestinal: soft, non-tender, and distended. Wounds are dry and clean.   Labs:     Latest Ref Rng & Units 07/09/2022   11:06 PM 07/09/2022    9:21 AM 07/07/2022    1:42 PM  CBC  WBC 4.0 - 10.5 K/uL  16.8  16.2   Hemoglobin 12.0 - 15.0 g/dL 8.4  6.3  8.6   Hematocrit 36.0 - 46.0 % 25.8  20.3  27.2   Platelets 150 - 400 K/uL  218  287       Latest Ref Rng & Units 07/09/2022    9:21 AM 07/07/2022    1:42 PM 07/06/2022    8:01 AM  CMP  Glucose 70 - 99 mg/dL 83  161  139   BUN 8 - 23 mg/dL 16  23  16    Creatinine 0.44 - 1.00 mg/dL 0.78  1.23  0.79   Sodium 135 - 145 mmol/L 137  137  135   Potassium 3.5 - 5.1 mmol/L 3.7  4.0  3.9   Chloride 98 - 111 mmol/L 109  103  94   CO2 22 - 32 mmol/L 20  24  30    Calcium 8.9 - 10.3 mg/dL 7.7  7.6  9.9   Total Protein 6.5 - 8.1 g/dL   7.6   Total Bilirubin 0.3 - 1.2 mg/dL   1.3   Alkaline Phos 38 - 126 U/L   58   AST 15 - 41 U/L   26   ALT 0 - 44 U/L   35     Imaging  studies: No new pertinent imaging studies   Assessment/Plan:  76 y.o. female with bilateral femoral hernia with incarcerated right femoral hernia with obstruction 4 Day Post-Op s/p robotic assisted laparoscopic bilateral femoral hernia repair.  -Continue with prolonged ileus post surgery. Will order CT of the abdomen and pelvis with IV and oral contras for evaluation of other causes of ileus vs obstruction.  -Adequate vitals signs, no tachycardia, no fever -Adequate increase of hemoglobin s/p transfusion of 2 units of PRBC.  -Will hold DVT prophylaxis until bleeding is ruled out.  -Will continue pain medication -Encourage the patient to ambulate.   Arnold Long, MD

## 2022-07-10 NOTE — Progress Notes (Signed)
Patient ID: [name], female   DOB: 11-13-1945, 76 y.o.   MRN: [id number]  Patient re evaluated this afternoon. Patient looks comfortable. She endorses that continue to feel better than before the transfusion. Denies nausea. Endorses that the Reglan is helping. Endorses passing gas but no bowel movement. Endorses voiding spontaneously without difficulty.   Vitals:   07/10/22 0420 07/10/22 0746  BP: (!) 110/56 (!) 115/58  Pulse: 76 79  Resp: 16 18  Temp: 98 F (36.7 C) 98.4 F (36.9 C)  SpO2: 96% 95%   General: alert, oriented x3, no distress Abdomen: soft and depressible, mild distended.   CT scan of the abdomen and pelvis shows a large 10 cm hematoma on the left pelvis. No contrast extravasation. Repeated hemoglobin now 8.5 which was 8.4 yesterday night after transfusion. This is a sign that there is no active bleeding.   NGT was at 10  so I removed the NGT. CT scan does not shows sign of obstruction. Will increase Reglan since it seems to be helping.  I will expect diarrhea at some point with the use of Miralax, Reglan and oral contrast.   I discuss with patient the finding of the CT scan, the large hematoma, no bowel obstruction. I discuss my recommendation of continue conservative management since there is no sign of active bleeding and hemoglobin stable. I consider that the risk of taking her for the third time to OR and give another anesthesia are higher than the benefit of draining an hematoma that does not seem to be bleeding or causing any complication (no bowel or bladder obstruction).   Will repeat labs in the morning and continue monitoring closely.   Herbert Pun, MD FACS.

## 2022-07-11 LAB — BASIC METABOLIC PANEL
Anion gap: 10 (ref 5–15)
BUN: 11 mg/dL (ref 8–23)
CO2: 18 mmol/L — ABNORMAL LOW (ref 22–32)
Calcium: 7.8 mg/dL — ABNORMAL LOW (ref 8.9–10.3)
Chloride: 110 mmol/L (ref 98–111)
Creatinine, Ser: 0.62 mg/dL (ref 0.44–1.00)
GFR, Estimated: >60 mL/min (ref >60)
Glucose, Bld: 69 mg/dL — ABNORMAL LOW (ref 70–99)
Potassium: 3.8 mmol/L (ref 3.5–5.1)
Sodium: 138 mmol/L (ref 135–145)

## 2022-07-11 LAB — PHOSPHORUS: Phosphorus: 1.9 mg/dL — ABNORMAL LOW (ref 2.5–4.6)

## 2022-07-11 LAB — HEMOGLOBIN AND HEMATOCRIT, BLOOD
HCT: 24.9 % — ABNORMAL LOW (ref 36.0–46.0)
Hemoglobin: 8.2 g/dL — ABNORMAL LOW (ref 12.0–15.0)

## 2022-07-11 LAB — MAGNESIUM: Magnesium: 1.9 mg/dL (ref 1.7–2.4)

## 2022-07-11 MED ORDER — METOCLOPRAMIDE HCL 5 MG/ML IJ SOLN
5.0000 mg | Freq: Three times a day (TID) | INTRAMUSCULAR | Status: DC
  Administered 2022-07-11 – 2022-07-13 (×6): 5 mg via INTRAVENOUS
  Filled 2022-07-11 (×6): qty 2

## 2022-07-11 MED ORDER — POTASSIUM & SODIUM PHOSPHATES 280-160-250 MG PO PACK
1.0000 | PACK | Freq: Three times a day (TID) | ORAL | Status: DC
  Administered 2022-07-11 – 2022-07-13 (×6): 1 via ORAL
  Filled 2022-07-11 (×15): qty 1

## 2022-07-11 NOTE — Progress Notes (Signed)
Mobility Specialist - Progress Note   07/11/22 0941  Mobility  Activity Ambulated independently in hallway;Stood at bedside;Dangled on edge of bed  Level of Assistance Independent  Distance Ambulated (ft) 160 ft  Activity Response Tolerated well  $Mobility charge 1 Mobility   Pt supine in bed on RA upon arrival. Pt STS and ambulates 1 lap around NS indep. Pt vastly improved from previous session, no complaints. Pt returns to bed with needs in reach and family in room.   [name]  Mobility Specialist  07/11/22 9:42 AM

## 2022-07-11 NOTE — TOC Initial Note (Addendum)
Transition of Care [facility]) - Initial/Assessment Note    Patient Details  Name: [name] MRN: [id number] Date of Birth: [birth year]  Transition of Care [facility]) CM/SW Contact:    Candie Chroman, LCSW Phone Number: 07/11/2022, 2:02 PM  Clinical Narrative:   Per MD, patient is interested in getting some extra help at home but no need for home health RN, PT, etc. Met with patient, husband, and sister-in-law at bedside. Patient is aware of benefit through [facility] [employer] that pays for some personal care services. [place] made [facility] team aware of this recently. Left voicemail for client relations coordinator with [employer] and also sent her an email.                2:20 pm: Received return email from [place]. Client Relations Coordinator will reach out to patient.  Expected Discharge Plan:  (Home with personal care services?) Barriers to Discharge: Continued Medical Work up   Patient Goals and CMS Choice        Expected Discharge Plan and Services Expected Discharge Plan:  (Home with personal care services?)     Post Acute Care Choice:  (PCS) Living arrangements for the past 2 months: Apartment                                      Prior Living Arrangements/Services Living arrangements for the past 2 months: Apartment Lives with:: Spouse Patient language and need for interpreter reviewed:: Yes Do you feel safe going back to the place where you live?: Yes      Need for Family Participation in Patient Care: Yes (Comment) Care giver support system in place?: Yes (comment)   Criminal Activity/Legal Involvement Pertinent to Current Situation/Hospitalization: No - Comment as needed  Activities of Daily Living Home Assistive Devices/Equipment: Hearing aid ADL Screening (condition at time of admission) Patient's cognitive ability adequate to safely complete daily activities?: Yes Is the patient deaf or have difficulty hearing?: Yes Does the patient have  difficulty seeing, even when wearing glasses/contacts?: No Does the patient have difficulty concentrating, remembering, or making decisions?: No Patient able to express need for assistance with ADLs?: Yes Does the patient have difficulty dressing or bathing?: No Independently performs ADLs?: Yes (appropriate for developmental age) Does the patient have difficulty walking or climbing stairs?: No Weakness of Legs: None Weakness of Arms/Hands: None  Permission Sought/Granted Permission sought to share information with : Facility Sport and exercise psychologist, Family Supports Permission granted to share information with : Yes, Verbal Permission Granted  Share Information with NAME: [name]  Permission granted to share info w AGENCY: [place] granted to share info w Relationship: Spouse and sister-in-law  Permission granted to share info w Contact Information: [phone]  Emotional Assessment Appearance:: Appears stated age Attitude/Demeanor/Rapport: Engaged, Gracious Affect (typically observed): Accepting, Appropriate, Calm, Pleasant Orientation: : Oriented to Self, Oriented to Place, Oriented to  Time, Oriented to Situation Alcohol / Substance Use: Not Applicable Psych Involvement: No (comment)  Admission diagnosis:  SBO (small bowel obstruction) ([place]) [K56.609] 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 [K41.30] Vomiting, unspecified vomiting type, unspecified whether nausea present [R11.10] Patient Active Problem List   Diagnosis Date Noted   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 07/06/2022   Intussusception of intestine ([place]) 06/28/2022   Infected sebaceous cyst 06/14/2022   Lung nodule 05/01/2022   Jaw pain 04/17/2022   Abnormal chest x-ray  04/17/2022   Dysuria 03/09/2022   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   Varicose veins of bilateral lower extremities with other complications 15/52/0802   Leg weakness, bilateral 01/31/2022   Vomiting  without nausea 01/31/2022   Vitamin D deficiency 01/31/2022   Burning tongue 01/31/2022   Tightness of neck 01/31/2022   High vitamin D level 07/05/2021   Hypothyroidism, unspecified 07/02/2020   Wart 07/02/2020   Dermatitis 02/20/2020   Polyp of transverse colon    Gastric polyp    Stomach irritation    Healthcare maintenance 06/30/2019   Upper back pain 04/14/2018   Aortic atherosclerosis ([place]) 05/31/2017   Cancer of lower lobe of right lung ([place]) 05/06/2016   Thyroid cancer ([place]) 09/11/2015   Thrombocytopenia ([place]) 07/03/2013   Osteopenia 05/05/2013   GERD (gastroesophageal reflux disease) 04/26/2012   Essential hypertension 11/17/2010   HLD (hyperlipidemia) 01/31/2008   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

## 2022-07-11 NOTE — Progress Notes (Signed)
Mobility Specialist - Progress Note   07/11/22 1540  Mobility  Activity Ambulated independently in hallway;Stood at bedside;Dangled on edge of bed  Level of Assistance Independent  Distance Ambulated (ft) 160 ft  Activity Response Tolerated well  $Mobility charge 1 Mobility   Pt supine in bed on RA upon arrival. Pt STS and ambulates 1 lap around NS. Pt returns to bed with needs in reach and family in room.   Gretchen Short  Mobility Specialist  07/11/22 3:41 PM

## 2022-07-11 NOTE — Progress Notes (Signed)
Patient ID: [name], female   DOB: February 16, 1946, 75 y.o.   MRN: [id number]     [facility] Hospital Day(s): 5.   Interval History: Patient seen and examined, no acute events or new complaints overnight. Patient reports feeling better this morning.  She understood that she had multiple bowel movements last night.  She denies any nausea or vomiting despite having the NGT out.  Vital signs in last 24 hours: [min-max] current  Temp:  [97.8 F (36.6 C)-98.5 F (36.9 C)] 98.5 F (36.9 C) (09/11 0800) Pulse Rate:  [72-78] 78 (09/11 0800) Resp:  [15-18] 15 (09/11 0800) BP: (110-114)/(53-58) 114/56 (09/11 0800) SpO2:  [97 %-98 %] 98 % (09/11 0800)     Height: 5\' 4"  (162.6 cm) Weight: 56 kg BMI (Calculated): 21.18   Physical Exam:  Constitutional: alert, cooperative and no distress  Respiratory: breathing non-labored at rest  Cardiovascular: regular rate and sinus rhythm  Gastrointestinal: soft, non-tender, and non-distended  Labs:     Latest Ref Rng & Units 07/11/2022    5:25 AM 07/10/2022    2:29 PM 07/09/2022   11:06 PM  CBC  Hemoglobin 12.0 - 15.0 g/dL 8.2  8.5  8.4   Hematocrit 36.0 - 46.0 % 24.9  26.1  25.8       Latest Ref Rng & Units 07/11/2022    5:25 AM 07/09/2022    9:21 AM 07/07/2022    1:42 PM  CMP  Glucose 70 - 99 mg/dL 69  83  161   BUN 8 - 23 mg/dL 11  16  23    Creatinine 0.44 - 1.00 mg/dL 0.62  0.78  1.23   Sodium 135 - 145 mmol/L 138  137  137   Potassium 3.5 - 5.1 mmol/L 3.8  3.7  4.0   Chloride 98 - 111 mmol/L 110  109  103   CO2 22 - 32 mmol/L 18  20  24    Calcium 8.9 - 10.3 mg/dL 7.8  7.7  7.6     Imaging studies: No new pertinent imaging studies   Assessment/Plan:  76 y.o. female with bilateral femoral hernia with incarcerated right femoral hernia with obstruction 5 Day Post-Op s/p robotic assisted laparoscopic bilateral femoral hernia repair.  Patient this morning doing better.  She had multiple bowel movement overnight.  Her hemoglobin  continue to be stable in 8.2 from 8.5.  No sign of bleeding.  Patient was able to ambulate this morning as well.  Will advance diet to full liquids.  Will replace phosphate.  We will start decreasing Reglan frequency.  We will continue to monitor closely.  Encourage the patient to continue ambulation during the day.    Arnold Long, MD

## 2022-07-11 NOTE — Anesthesia Postprocedure Evaluation (Signed)
Anesthesia Post Note  Patient: Blonnie Maske  Procedure(s) Performed: XI ROBOTIC ASSISTED INGUINAL HERNIA- femoral hernia right vs. bilateral (Right) INDOCYANINE GREEN FLUORESCENCE IMAGING (ICG) INSERTION OF MESH  Patient location during evaluation: PACU Anesthesia Type: General Level of consciousness: awake and alert Pain management: pain level controlled Vital Signs Assessment: post-procedure vital signs reviewed and stable Respiratory status: spontaneous breathing, nonlabored ventilation, respiratory function stable and patient connected to nasal cannula oxygen Cardiovascular status: blood pressure returned to baseline and stable Postop Assessment: no apparent nausea or vomiting Anesthetic complications: no   No notable events documented.   Last Vitals:  Vitals:   07/11/22 0800 07/11/22 1521  BP: (!) 114/56 127/62  Pulse: 78 80  Resp: 15 16  Temp: 36.9 C   SpO2: 98% 97%    Last Pain:  Vitals:   07/11/22 0916  TempSrc:   PainSc: 0-No pain                 Molli Barrows

## 2022-07-11 NOTE — Progress Notes (Signed)
Mobility Specialist - Progress Note   07/11/22 1300  Mobility  Activity Ambulated independently in hallway;Stood at bedside;Dangled on edge of bed  Level of Assistance Independent  Distance Ambulated (ft) 160 ft  Activity Response Tolerated well  $Mobility charge 1 Mobility   Pt semi-supine in bed on RA upon arrival. Pt STS and ambulates 1 lap around NS indep. Pt returns to bed with needs in reach and family in room.   Gretchen Short  Mobility Specialist  07/11/22 1:29 PM

## 2022-07-11 NOTE — Care Management Important Message (Signed)
Important Message  Patient Details  Name: [name] MRN: [id number] Date of Birth: May 26, 1946   Medicare Important Message Given:  Yes     Loann Quill 07/11/2022, 3:43 PM

## 2022-07-12 ENCOUNTER — Encounter: Payer: PPO | Admitting: Family Medicine

## 2022-07-12 LAB — PHOSPHORUS: Phosphorus: 2.2 mg/dL — ABNORMAL LOW (ref 2.5–4.6)

## 2022-07-12 LAB — BASIC METABOLIC PANEL
Anion gap: 10 (ref 5–15)
BUN: 7 mg/dL — ABNORMAL LOW (ref 8–23)
CO2: 20 mmol/L — ABNORMAL LOW (ref 22–32)
Calcium: 7.9 mg/dL — ABNORMAL LOW (ref 8.9–10.3)
Chloride: 106 mmol/L (ref 98–111)
Creatinine, Ser: 0.62 mg/dL (ref 0.44–1.00)
GFR, Estimated: >60 mL/min (ref >60)
Glucose, Bld: 88 mg/dL (ref 70–99)
Potassium: 3.6 mmol/L (ref 3.5–5.1)
Sodium: 136 mmol/L (ref 135–145)

## 2022-07-12 LAB — HEMOGLOBIN AND HEMATOCRIT, BLOOD
HCT: 27.9 % — ABNORMAL LOW (ref 36.0–46.0)
Hemoglobin: 9.2 g/dL — ABNORMAL LOW (ref 12.0–15.0)

## 2022-07-12 MED ORDER — FAMOTIDINE 20 MG PO TABS
20.0000 mg | ORAL_TABLET | Freq: Every day | ORAL | Status: DC
  Administered 2022-07-12 – 2022-07-13 (×2): 20 mg via ORAL
  Filled 2022-07-12 (×2): qty 1

## 2022-07-12 MED ORDER — PANTOPRAZOLE SODIUM 40 MG PO TBEC: 40.0000 mg | DELAYED_RELEASE_TABLET | Freq: Every day | ORAL | Status: DC

## 2022-07-12 MED ORDER — ENOXAPARIN SODIUM 40 MG/0.4ML IJ SOSY
40.0000 mg | PREFILLED_SYRINGE | INTRAMUSCULAR | Status: DC
  Administered 2022-07-12 – 2022-07-13 (×2): 40 mg via SUBCUTANEOUS
  Filled 2022-07-12 (×2): qty 0.4

## 2022-07-12 MED ORDER — PANTOPRAZOLE SODIUM 40 MG PO TBEC
40.0000 mg | DELAYED_RELEASE_TABLET | Freq: Every day | ORAL | Status: DC
  Administered 2022-07-13 – 2022-07-14 (×2): 40 mg via ORAL
  Filled 2022-07-12 (×2): qty 1

## 2022-07-12 NOTE — Progress Notes (Signed)
Mobility Specialist - Progress Note   07/12/22 1259  Mobility  Activity Ambulated independently in hallway  Level of Assistance Independent  Assistive Device None  Distance Ambulated (ft) 480 ft  Activity Response Tolerated well  $Mobility charge 1 Mobility   Pt sitting in recliner on RA upon arrival. Pt STS and ambulates in hallway indep, one LOB corrected by pt. Pt returns to recliner with needs in reach and family in room.  Gretchen Short  Mobility Specialist  07/12/22 1:01 PM

## 2022-07-12 NOTE — Progress Notes (Signed)
Patient ID: [name], female   DOB: [birth year], 76 y.o.   MRN: [id number]     [facility] Hospital Day(s): 6.   Interval History: Patient seen and examined, no acute events or new complaints overnight. Patient reports feeling with sore throat. No nausea. Passing gas but no bowel movement yesterday.   Vital signs in last 24 hours: [min-max] current  Temp:  [97.7 F (36.5 C)-98.5 F (36.9 C)] 97.7 F (36.5 C) (09/12 0551) Pulse Rate:  [78-82] 80 (09/12 0551) Resp:  [15-19] 19 (09/12 0551) BP: (114-130)/(56-68) 130/68 (09/12 0551) SpO2:  [95 %-100 %] 95 % (09/12 0551)     Height: 5\' 4"  (162.6 cm) Weight: 56 kg BMI (Calculated): 21.18   Physical Exam:  Constitutional: alert, cooperative and no distress  Respiratory: breathing non-labored at rest  Cardiovascular: regular rate and sinus rhythm  Gastrointestinal: soft, non-tender, and mild-distended  Labs:     Latest Ref Rng & Units 07/11/2022    5:25 AM 07/10/2022    2:29 PM 07/09/2022   11:06 PM  CBC  Hemoglobin 12.0 - 15.0 g/dL 8.2  8.5  8.4   Hematocrit 36.0 - 46.0 % 24.9  26.1  25.8       Latest Ref Rng & Units 07/11/2022    5:25 AM 07/09/2022    9:21 AM 07/07/2022    1:42 PM  CMP  Glucose 70 - 99 mg/dL 69  83  161   BUN 8 - 23 mg/dL 11  16  23    Creatinine 0.44 - 1.00 mg/dL 0.62  0.78  1.23   Sodium 135 - 145 mmol/L 138  137  137   Potassium 3.5 - 5.1 mmol/L 3.8  3.7  4.0   Chloride 98 - 111 mmol/L 110  109  103   CO2 22 - 32 mmol/L 18  20  24    Calcium 8.9 - 10.3 mg/dL 7.8  7.7  7.6     Imaging studies: No new pertinent imaging studies   Assessment/Plan:  76 y.o. female with bilateral femoral hernia with incarcerated right femoral hernia with obstruction 6 Day Post-Op s/p robotic assisted laparoscopic bilateral femoral hernia repair.  Patient without clinical deterioration.  Adequate vital signs. No fever, no tachycardia No nausea, tolerated full liquid. Will advance to soft diet Will give Miralax  again to assist with bowel movement.  Encourage to ambulate Will continue to hold pharmacologic DVT prophylaxis due to episode of bleeding Will repeat labs for evaluation of hemoglobin and electrolytes.   Arnold Long, MD

## 2022-07-12 NOTE — Progress Notes (Signed)
Mobility Specialist - Progress Note   07/12/22 1536  Mobility  Activity Ambulated independently in hallway  Level of Assistance Independent  Assistive Device None  Distance Ambulated (ft) 320 ft  Activity Response Tolerated well  $Mobility charge 1 Mobility   Pt sitting in recliner on RA upon arrival. Pt STS and ambulates in hallway indep. Pt left in restroom with family in room.   Gretchen Short  Mobility Specialist  07/12/22 3:37 PM

## 2022-07-12 NOTE — Progress Notes (Signed)
Mobility Specialist - Progress Note   07/12/22 0938  Mobility  Activity Ambulated independently in hallway  Level of Assistance Independent  Assistive Device None  Distance Ambulated (ft) 260 ft  Activity Response Tolerated well  $Mobility charge 1 Mobility   Pt sitting in recliner on RA upon arrival. Pt STS and ambulates in hallway indep. Pt returns to recliner with needs in reach.   Gretchen Short  Mobility Specialist  07/12/22 9:39 AM

## 2022-07-12 NOTE — Progress Notes (Signed)
PHARMACIST - PHYSICIAN COMMUNICATION  CONCERNING: IV to Oral Route Change Policy  RECOMMENDATION: This patient is receiving pantoprazole & famotidine by the intravenous route.  Based on criteria approved by the Pharmacy and Therapeutics Committee, the intravenous medication(s) is/are being converted to the equivalent oral dose form(s).   DESCRIPTION: These criteria include: The patient is eating (either orally or via tube) and/or has been taking other orally administered medications for a least 24 hours The patient has no evidence of active gastrointestinal bleeding or impaired GI absorption (gastrectomy, short bowel, patient on TNA or NPO).  If you have questions about this conversion, please contact the [place] 07/12/2022 10:52 AM

## 2022-07-12 NOTE — Plan of Care (Signed)

## 2022-07-12 NOTE — TOC Progression Note (Signed)
Transition of Care [facility]) - Progression Note    Patient Details  Name: [name] MRN: [id number] Date of Birth: 02/13/1946  Transition of Care [facility]) CM/SW Contact  Beverly Sessions, RN Phone Number: 07/12/2022, 3:35 PM  Clinical Narrative:     Per patient request secure emailed orders for custodial care, and insurance information to [facility] with [facility]   Expected Discharge Plan:  (Home with personal care services?) Barriers to Discharge: Continued Medical Work up  Expected Discharge Plan and Services Expected Discharge Plan:  (Home with personal care services?)     Post Acute Care Choice:  (PCS) Living arrangements for the past 2 months: Apartment                                       Social Determinants of Health (SDOH) Interventions    Readmission Risk Interventions     No data to display

## 2022-07-13 MED ORDER — METOCLOPRAMIDE HCL 5 MG/ML IJ SOLN
5.0000 mg | Freq: Two times a day (BID) | INTRAMUSCULAR | Status: DC
  Administered 2022-07-13 – 2022-07-14 (×2): 5 mg via INTRAVENOUS
  Filled 2022-07-13 (×2): qty 2

## 2022-07-13 NOTE — Progress Notes (Signed)
Patient ID: [name], female   DOB: November 09, 1945, 76 y.o.   MRN: [id number]     [facility] Day(s): 7.   Interval History: Patient seen and examined, no acute events or new complaints overnight. Patient reports she was feeling better this morning but during the morning she started feeling nauseated.  Denies vomiting.  Minimally bloated.  Still passing gas.  Has had multiple bowel movement.  Has been able to get out of bed and sit down in the chair.  She has been able to ambulate as well.  Vital signs in last 24 hours: [min-max] current  Temp:  [98.2 F (36.8 C)-98.5 F (36.9 C)] 98.2 F (36.8 C) (09/13 0800) Pulse Rate:  [74-79] 74 (09/13 0800) Resp:  [18] 18 (09/13 0800) BP: (127-131)/(67-74) 127/73 (09/13 0800) SpO2:  [96 %-99 %] 97 % (09/13 0800)     Height: 5\' 4"  (162.6 cm) Weight: 56 kg BMI (Calculated): 21.18   Physical Exam:  Constitutional: alert, cooperative and no distress  Respiratory: breathing non-labored at rest  Cardiovascular: regular rate and sinus rhythm  Gastrointestinal: soft, non-tender, and mild distended  Labs:     Latest Ref Rng & Units 07/12/2022    9:19 AM 07/11/2022    5:25 AM 07/10/2022    2:29 PM  CBC  Hemoglobin 12.0 - 15.0 g/dL 9.2  8.2  8.5   Hematocrit 36.0 - 46.0 % 27.9  24.9  26.1       Latest Ref Rng & Units 07/12/2022    9:19 AM 07/11/2022    5:25 AM 07/09/2022    9:21 AM  CMP  Glucose 70 - 99 mg/dL 88  69  83   BUN 8 - 23 mg/dL 7  11  16    Creatinine 0.44 - 1.00 mg/dL 0.62  0.62  0.78   Sodium 135 - 145 mmol/L 136  138  137   Potassium 3.5 - 5.1 mmol/L 3.6  3.8  3.7   Chloride 98 - 111 mmol/L 106  110  109   CO2 22 - 32 mmol/L 20  18  20    Calcium 8.9 - 10.3 mg/dL 7.9  7.8  7.7     Imaging studies: No new pertinent imaging studies   Assessment/Plan:  76 y.o. female with bilateral femoral hernia with incarcerated right femoral hernia with obstruction 7 Day Post-Op s/p robotic assisted laparoscopic bilateral  femoral hernia repair.  -Patient continue with stable vital signs.  No fever. -She does feel nauseated during the morning today.  We will give her nausea medication.  We will reevaluate and assess to see if she tolerated diet.  She is having bowel movements and passing gas.  No concern of bowel obstruction.  Most likely intestine motility is still slow. -We will decrease Reglan frequency and hold MiraLAX today due to multiple bowel movement. -Continue DVT prophylaxis -Continue ambulation -Continue pain control -Patient may shower   Arnold Long, MD

## 2022-07-13 NOTE — Progress Notes (Signed)
Mobility Specialist - Progress Note   07/13/22 0941  Mobility  Activity Ambulated independently in hallway  Level of Assistance Independent  Assistive Device None  Distance Ambulated (ft) 320 ft  Activity Response Tolerated well  $Mobility charge 1 Mobility   Pt sitting in recliner on RA upon arrival. Pt STS and ambulates 2 laps around NS indep. Pt returns to recliner with needs in reach.   Gretchen Short  Mobility Specialist  07/13/22 9:42 AM

## 2022-07-13 NOTE — Plan of Care (Signed)

## 2022-07-13 NOTE — Progress Notes (Signed)
Mobility Specialist - Progress Note   07/13/22 1604  Mobility  Activity Ambulated independently in hallway  Level of Assistance Independent  Assistive Device None  Distance Ambulated (ft) 320 ft  Activity Response Tolerated well  $Mobility charge 1 Mobility   Pt sitting in recliner on RA upon arrival. Pt STS and ambulates 2 laps around NS indep. Pt returns to recliner with needs in reach and husband in room.   Gretchen Short  Mobility Specialist  07/13/22 4:05 PM

## 2022-07-13 NOTE — Progress Notes (Signed)
Mobility Specialist - Progress Note   07/13/22 1322  Mobility  Activity Ambulated independently in hallway  Level of Assistance Independent  Assistive Device None  Distance Ambulated (ft) 480 ft  Activity Response Tolerated well  $Mobility charge 1 Mobility   Pt sitting upright in recliner on RA upon arrival. Pt STS and ambulates 3 laps around NS indep. Pt returns to chair with needs in reach and husband in room.   Gretchen Short  Mobility Specialist  07/13/22 1:23 PM

## 2022-07-14 MED ORDER — ONDANSETRON 4 MG PO TBDP: 4.0000 mg | ORAL_TABLET | Freq: Three times a day (TID) | ORAL | 0 refills | Status: DC | PRN

## 2022-07-14 MED ORDER — FAMOTIDINE 20 MG PO TABS: 20.0000 mg | ORAL_TABLET | Freq: Every day | ORAL | 0 refills | Status: DC

## 2022-07-14 NOTE — Progress Notes (Signed)
Mobility Specialist - Progress Note   07/14/22 0922  Mobility  Activity Ambulated independently in hallway  Level of Assistance Independent  Assistive Device None  Distance Ambulated (ft) 280 ft  Activity Response Tolerated well  $Mobility charge 1 Mobility   Pt sitting in recliner on RA upon arrival. Pt STS and ambulates in hallway indep. Pt returns to recliner with needs in reach.  Gretchen Short  Mobility Specialist  07/14/22 9:23 AM

## 2022-07-14 NOTE — Progress Notes (Signed)
Mobility Specialist - Progress Note   07/14/22 1155  Mobility  Activity Ambulated independently in hallway  Level of Assistance Independent  Assistive Device None  Distance Ambulated (ft) 320 ft  Activity Response Tolerated well  $Mobility charge 1 Mobility   Pt sitting in recliner on RA upon arrival. Pt STS and ambulates in hallway indep. Pt returns to recliner with needs in reach.  Gretchen Short  Mobility Specialist  07/14/22 11:56 AM

## 2022-07-14 NOTE — Care Management Important Message (Signed)
Important Message  Patient Details  Name: Emalina Dubreuil MRN: [id number] Date of Birth: 04-17-1946   Medicare Important Message Given:  Yes     Loann Quill 07/14/2022, 11:47 AM

## 2022-07-14 NOTE — Discharge Instructions (Signed)
  Diet: Resume home heart healthy regular diet.   Activity: No heavy lifting >20 pounds (children, pets, laundry, garbage) or strenuous activity until follow-up, but light activity and walking are encouraged. Do not drive or drink alcohol if taking narcotic pain medications.  Wound care: May shower with soapy water and pat dry (do not rub incisions), but no baths or submerging incision underwater until follow-up. (no swimming)   Medications: Resume all home medication. For mild to moderate pain: acetaminophen (Tylenol) or ibuprofen (if no kidney disease). Combining Tylenol with alcohol can substantially increase your risk of causing liver disease. Narcotic pain medications, if prescribed, can be used for severe pain, though may cause nausea, constipation, and drowsiness.  If you do not need the narcotic pain medication, you do not need to fill the prescription.  Call office [phone]) at any time if any questions, worsening pain, fevers/chills, bleeding, drainage from incision site, or other concerns.

## 2022-07-14 NOTE — Discharge Summary (Signed)
Patient ID: [name] MRN: [id number] DOB/AGE: 78-Apr-1947 78 y.o.  Admit date: 07/06/2022 Discharge date: 07/14/2022   Discharge Diagnoses:  Principal Problem:   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   Procedures: Robotic assisted laparoscopic bilateral femoral hernia repair with mesh  Hospital Course: Patient admitted with right femoral hernia with incarcerated bowel with obstruction.  She also had left femoral hernia.  She underwent robotic assisted laparoscopic bilateral femoral hernia repair with mesh.  She developed postoperative ileus that resolved spontaneously.  She also had postoperative bleeding with large hematoma that was controlled spontaneously.  She needed 2 units of PRBC due to hemoglobin drop to 6.3 but after transfusions in the hemoglobin continue to be stable.  Last hemoglobin was 9.2.  She then continue recovering adequately tolerating diet.  She has been ambulating better.  She was able to shower yesterday.  She is tolerating diet.  She is having bowel movement.  Physical Exam Vitals reviewed.  Constitutional:      Appearance: Normal appearance.  HENT:     Head: Normocephalic.  Cardiovascular:     Rate and Rhythm: Normal rate and regular rhythm.     Pulses: Normal pulses.  Pulmonary:     Effort: Pulmonary effort is normal.     Breath sounds: Normal breath sounds.  Abdominal:     General: Abdomen is flat. Bowel sounds are normal. There is no distension.     Palpations: Abdomen is soft.  Musculoskeletal:        General: Normal range of motion.     Cervical back: Normal range of motion.  Skin:    General: Skin is warm.     Capillary Refill: Capillary refill takes less than 2 seconds.     Findings: Bruising present.  Neurological:     General: No focal deficit present.     Mental Status: She is alert and oriented to person, place, and time.      Consults: None  Disposition: Discharge disposition: 01-Home or Self Care       Discharge  Instructions     Diet - low sodium heart healthy   Complete by: As directed    Increase activity slowly   Complete by: As directed       Allergies as of 07/14/2022       Reactions   Atorvastatin    REACTION: myalgias   Ciprofloxacin    REACTION: increase reflux   Epinephrine    Heart racing with dental injection   Metoprolol Succinate Swelling   Swelling and stiffness in hands and ankles   Nortriptyline    Intolerant but not allergy   Simvastatin Other (See Comments)   Muscle aches and stiffness   Sulfa Antibiotics Other (See Comments)   REACTION: itching        Medication List     TAKE these medications    acetaminophen 500 MG tablet Commonly known as: TYLENOL Take 1,000 mg by mouth every 6 (six) hours as needed.   ascorbic acid 500 MG tablet Commonly known as: VITAMIN C Take 2,000 mg by mouth daily. Reported on 02/04/2016   aspirin EC 81 MG tablet Take 1 tablet (81 mg total) by mouth daily.   Biotin 1000 MCG tablet Take 1,000 mcg by mouth daily. Reported on 02/04/2016   cephALEXin 500 MG capsule Commonly known as: KEFLEX Take 1 capsule (500 mg total) by mouth 3 (three) times daily.   Coenzyme Q10 200 MG capsule Take 100 mg by mouth daily.  diclofenac Sodium 1 % Gel Commonly known as: Voltaren Arthritis Pain Apply 2 g topically 4 (four) times daily as needed.   famotidine 20 MG tablet Commonly known as: PEPCID Take 1 tablet (20 mg total) by mouth at bedtime.   HYDROcodone-acetaminophen 5-325 MG tablet Commonly known as: NORCO/VICODIN Take 1 tablet by mouth every 4 (four) hours as needed for moderate pain.   levothyroxine 75 MCG tablet Commonly known as: Synthroid Take 1 tablet (75 mcg total) by mouth daily before breakfast.   Lutein 20 MG Caps Take 20 mg by mouth daily.   nortriptyline 10 MG capsule Commonly known as: PAMELOR Take 10 mg by mouth at bedtime.   ondansetron 4 MG disintegrating tablet Commonly known as: ZOFRAN-ODT Take 1  tablet (4 mg total) by mouth every 8 (eight) hours as needed for nausea or vomiting.   ondansetron 4 MG tablet Commonly known as: ZOFRAN Take 1 tablet (4 mg total) by mouth every 8 (eight) hours as needed for nausea or vomiting.   pantoprazole 40 MG tablet Commonly known as: PROTONIX TAKE 1 TABLET BY MOUTH EVERY DAY   Potassium Gluconate 595 MG Caps Take 595 mg by mouth daily.   pravastatin 40 MG tablet Commonly known as: PRAVACHOL TAKE 1 TABLET BY MOUTH EVERY DAY        Follow-up Information     Herbert Pun, MD Follow up in 2 week(s).   Specialty: General Surgery Contact information: [address] [place] [address] [phone]

## 2022-07-15 ENCOUNTER — Telehealth: Payer: Self-pay | Admitting: *Deleted

## 2022-07-15 ENCOUNTER — Encounter: Payer: Self-pay | Admitting: *Deleted

## 2022-07-15 NOTE — Patient Outreach (Signed)
  Care Coordination [facility] Note Transition Care Management Follow-up Telephone Call Date of discharge and from where: [place] on 07/14/22 How have you been since you were released from the hospital? "I'm glad to be home. I'm sore all over but rested better" Any questions or concerns? Yes. Fluid in bilateral legs and ankles. Dr was aware of it prior to discharge. 13lb increase in weight. Fluid increases after being up in the morning. Urination is normal. Denies SOB or chest discomfort. Recommended heel pumps and prop feet up when sitting. Notified PCP by staff message.    Items Reviewed: Did the pt receive and understand the discharge instructions provided? Yes  Medications obtained and verified? Yes  Other? No  Any new allergies since your discharge? No  Dietary orders reviewed? Yes Do you have support at home? Yes   Home Care and Equipment/Supplies: Were home health services ordered? yes If so, what is the name of the agency? [facility]  Has the agency set up a time to come to the patient's home? yes Were any new equipment or medical supplies ordered?  No What is the name of the medical supply agency? N/a Were you able to get the supplies/equipment? not applicable Do you have any questions related to the use of the equipment or supplies? No  Functional Questionnaire: (I = Independent and D = Dependent) ADLs: I  Bathing/Dressing- I  Meal Prep- I  Eating- I  Maintaining continence- I  Transferring/Ambulation- I  Managing Meds- I  Follow up appointments reviewed:  PCP Hospital f/u appt confirmed? No  Staff message sent to [place] to coordinate appointment. [facility] f/u appt confirmed? Yes  Scheduled to see Herbert Pun, MD on 07/28/22 @ 8:45. Are transportation arrangements needed? No  If their condition worsens, is the pt aware to call PCP or go to the Emergency Dept.? Yes Was the patient provided with contact  information for the PCP's office or ED? Yes Was to pt encouraged to call back with questions or concerns? Yes  SDOH assessments and interventions completed:   Yes  Care Coordination Interventions Activated:  Yes   Care Coordination Interventions:  PCP follow up appointment requested    Encounter Outcome:  Pt. Visit Completed    Chong Sicilian, BSN, RN-BC RN Care Coordinator [place]: [phone] Main #: [phone]

## 2022-07-18 ENCOUNTER — Telehealth: Payer: Self-pay | Admitting: Family Medicine

## 2022-07-18 NOTE — Telephone Encounter (Signed)
See below. Please triage patient about her sx.  Thanks.

## 2022-07-18 NOTE — Telephone Encounter (Signed)
Spoke to patient by telephone and was advised that she is doing a little better today.  Patient stated that the swelling is some better in her legs. Patient stated that the swelling goes down overnight and was better this morning.. Patient denies any SOB or difficulty breathing. Patient stated that she already has an appointment scheduled with Dr. Damita Dunnings tomorrow 07/19/22. Patient was given ER precautions and she verbalized understanding.

## 2022-07-18 NOTE — Telephone Encounter (Signed)
-----   Message from Ilean China, RN sent at 07/15/2022 10:03 AM EDT ----- Regarding: Lower Extremity Edema Hi Dr Phillip Heal,  I talked with [name] this morning regarding her discharge from [facility] yesterday. She complains of swelling in bilateral feet and lower legs. It does improve at night and worsens after standing. No redness, pain, or warmth. Advised that this is likely normal post surgery and to do heel pumps and prop her feet up when sitting/laying. I wanted to give you an update and request that your staff follow-up with any recommendations you may have or if you think diuretics would be helpful. She is urinating normally and denies any SOB or chest discomfort.   Please let me know if I can be of any assistance.  Chong Sicilian, BSN, RN-BC RN Care Coordinator [place] Direct Dial: [phone] Main #: [phone]

## 2022-07-19 ENCOUNTER — Inpatient Hospital Stay: Payer: PPO | Admitting: Family Medicine

## 2022-07-19 DIAGNOSIS — K4130 Unilateral femoral hernia, with obstruction, without gangrene, not specified as recurrent: Secondary | ICD-10-CM | POA: Diagnosis not present

## 2022-07-19 NOTE — Telephone Encounter (Signed)
I am out of clinic with an unexpected illness.  Please see about rescheduling.  Thanks.

## 2022-07-19 NOTE — Telephone Encounter (Signed)
Patient has been scheduled for 07/20/22

## 2022-07-20 ENCOUNTER — Encounter: Payer: Self-pay | Admitting: Family Medicine

## 2022-07-20 ENCOUNTER — Ambulatory Visit (INDEPENDENT_AMBULATORY_CARE_PROVIDER_SITE_OTHER): Payer: PPO | Admitting: Family Medicine

## 2022-07-20 VITALS — BP 122/76 | HR 78 | Temp 97.9°F | Ht 64.0 in | Wt 128.0 lb

## 2022-07-20 DIAGNOSIS — D649 Anemia, unspecified: Secondary | ICD-10-CM

## 2022-07-20 DIAGNOSIS — K561 Intussusception: Secondary | ICD-10-CM | POA: Diagnosis not present

## 2022-07-20 LAB — CBC WITH DIFFERENTIAL/PLATELET
Basophils Absolute: 0.1 10*3/uL (ref 0.0–0.1)
Basophils Relative: 0.9 % (ref 0.0–3.0)
Eosinophils Absolute: 0.2 10*3/uL (ref 0.0–0.7)
Eosinophils Relative: 3 % (ref 0.0–5.0)
HCT: 31.8 % — ABNORMAL LOW (ref 36.0–46.0)
Hemoglobin: 10.5 g/dL — ABNORMAL LOW (ref 12.0–15.0)
Lymphocytes Relative: 14.2 % (ref 12.0–46.0)
Lymphs Abs: 1.1 10*3/uL (ref 0.7–4.0)
MCHC: 33.1 g/dL (ref 30.0–36.0)
MCV: 92.8 fl (ref 78.0–100.0)
Monocytes Absolute: 0.6 10*3/uL (ref 0.1–1.0)
Monocytes Relative: 7.6 % (ref 3.0–12.0)
Neutro Abs: 5.9 10*3/uL (ref 1.4–7.7)
Neutrophils Relative %: 74.3 % (ref 43.0–77.0)
Platelets: 227 10*3/uL (ref 150.0–400.0)
RBC: 3.43 Mil/uL — ABNORMAL LOW (ref 3.87–5.11)
RDW: 17.7 % — ABNORMAL HIGH (ref 11.5–15.5)
WBC: 8 10*3/uL (ref 4.0–10.5)

## 2022-07-20 LAB — COMPREHENSIVE METABOLIC PANEL
ALT: 14 U/L (ref 0–35)
AST: 17 U/L (ref 0–37)
Albumin: 3.8 g/dL (ref 3.5–5.2)
Alkaline Phosphatase: 58 U/L (ref 39–117)
BUN: 11 mg/dL (ref 6–23)
CO2: 32 mEq/L (ref 19–32)
Calcium: 9.1 mg/dL (ref 8.4–10.5)
Chloride: 101 mEq/L (ref 96–112)
Creatinine, Ser: 0.61 mg/dL (ref 0.40–1.20)
GFR: 85.78 mL/min (ref >60.00)
Glucose, Bld: 91 mg/dL (ref 70–99)
Potassium: 4.2 mEq/L (ref 3.5–5.1)
Sodium: 139 mEq/L (ref 135–145)
Total Bilirubin: 1.2 mg/dL (ref 0.2–1.2)
Total Protein: 6.2 g/dL (ref 6.0–8.3)

## 2022-07-20 LAB — IRON: Iron: 57 ug/dL (ref 42–145)

## 2022-07-20 NOTE — Progress Notes (Signed)
Hospital Course: Patient admitted with right femoral hernia with incarcerated bowel with obstruction.  She also had left femoral hernia.  She underwent robotic assisted laparoscopic bilateral femoral hernia repair with mesh.  She developed postoperative ileus that resolved spontaneously.  She also had postoperative bleeding with large hematoma that was controlled spontaneously.  She needed 2 units of PRBC due to hemoglobin drop to 6.3 but after transfusions in the hemoglobin continue to be stable.  Last hemoglobin was 9.2.  She then continue recovering adequately tolerating diet.  She has been ambulating better.  She was able to shower yesterday.  She is tolerating diet.  She is having bowel movement.  ====================== Inpatient course d/w pt, with bowel resection followed by hernia surgery.    She had a BM yesterday.  She is passing gas.  No fevers.  Some nausea. Has zofran to use prn.  She is able to walk for about 15 minutes at home.    Some BLE edema.  Prev knee swelling is better. More edema as the days goes on.  Abd bloating is better.   Weight 138 at home, on arrival, gradually getting lighter in the meantime.    She is able to get home care/help covered through [employer].    Meds, vitals, and allergies reviewed.   ROS: Per HPI unless specifically indicated in ROS section   Nad Ncat Neck supple, no LA Rrr Ctab Abd soft Resolving bruise on the R side of the back.  Resolving bruising on the abd wall at port sites.   1+ BLE edema.    30 minutes were devoted to patient care in this encounter (this includes time spent reviewing the patient's file/history, interviewing and examining the patient, counseling/reviewing plan with patient).

## 2022-07-20 NOTE — Patient Instructions (Addendum)
I would keep using miralax daily if you haven't had a BM.  If 2 days without a BM, then okay to try dulcolax.    Go to the lab on the way out.   If you have mychart we'll likely use that to update you.    Take care.  Glad to see you.

## 2022-07-21 NOTE — Assessment & Plan Note (Signed)
History of, status post resection with benign pathology, followed by hernia surgery.  She had a postop ileus but improved in the meantime.  She is passing gas.  She had bowel movements in the meantime.  Discussed using MiraLAX daily as needed.  If she has 2 days without a bowel movement she can use Dulcolax.  Suspect that she had some third spacing of fluid that is improving in the meantime and I would expect this to gradually improves as she advances her diet gradually and takes in more protein.  I do not think it makes sense to start diuretic.  She required previous transfusion.  Her bruising is resolving.  She is not passing blood.  See notes on follow-up labs today.  At this point still okay for outpatient follow-up.  She agrees to plan.

## 2022-07-24 ENCOUNTER — Other Ambulatory Visit: Payer: Self-pay | Admitting: Internal Medicine

## 2022-07-25 NOTE — Telephone Encounter (Signed)
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  [address] [place], [place] [id number]  Rush Farmer MD GX:[id number]   Resulting Agency  [place] [place] [facility] [place] [facility] [place] [facility] [place] [facility] [place] [facility] [place] [facility]         Specimen Collected: 04/20/22 15:15 Last Resulted: 04/21/22 08:37      Lab Flowsheet    Order Details    View Encounter    Lab and Collection Details    Routing    Result History    View All Conversations on this Encounter      CM=Additional comments  R=Reference range differs from displayed range      Result Care Coordination   Patient Communication   Add Comments   Seen Back to Top       Other Results from 04/20/2022   Contains abnormal data Thyroglobulin antibody Order: [id number] Status: Final result    Visible to patient: Yes (seen)    Next appt: 08/19/2022 at 08:15 AM in Family Medicine ([facility])    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 [place], [place] [id number]  Rush Farmer MD [id number]   Resulting Agency  [facility]         Specimen Collected: 04/20/22 15:15 Last Resulted: 04/22/22 06:37

## 2022-07-26 DIAGNOSIS — K4130 Unilateral femoral hernia, with obstruction, without gangrene, not specified as recurrent: Secondary | ICD-10-CM | POA: Diagnosis not present

## 2022-08-02 DIAGNOSIS — K4130 Unilateral femoral hernia, with obstruction, without gangrene, not specified as recurrent: Secondary | ICD-10-CM | POA: Diagnosis not present

## 2022-08-09 ENCOUNTER — Telehealth: Payer: Self-pay | Admitting: Family Medicine

## 2022-08-09 MED ORDER — FAMOTIDINE 20 MG PO TABS: 20.0000 mg | ORAL_TABLET | Freq: Every day | ORAL | 0 refills | Status: DC

## 2022-08-09 NOTE — Telephone Encounter (Signed)
  Encourage patient to contact the pharmacy for refills or they can request refills through [facility]  Did the patient contact the pharmacy: No  LAST APPOINTMENT DATE: 07/20/2022  NEXT APPOINTMENT DATE: 08/25/2022  MEDICATION: famotidine (PEPCID) 20 MG tablet  Is the patient out of medication? No, 4 left  PHARMACY: [id number] - [place], [place] - [address]  Comment: Medication was prescribed to patient when she was in the hospital and they informed her to contact Dr. Damita Dunnings for a refill.   Let patient know to contact pharmacy at the end of the day to make sure medication is ready.  Please notify patient to allow 48-72 hours to process

## 2022-08-09 NOTE — Telephone Encounter (Signed)
Erx sent

## 2022-08-16 ENCOUNTER — Ambulatory Visit (INDEPENDENT_AMBULATORY_CARE_PROVIDER_SITE_OTHER): Payer: PPO

## 2022-08-16 VITALS — Ht 64.0 in | Wt 123.0 lb

## 2022-08-16 DIAGNOSIS — Z0000 Encounter for general adult medical examination without abnormal findings: Secondary | ICD-10-CM | POA: Diagnosis not present

## 2022-08-16 NOTE — Progress Notes (Signed)
Subjective:   [name] is a 76 y.o. female who presents for Medicare Annual (Subsequent) preventive examination.  Review of Systems    Virtual Visit via Telephone Note  I connected with  Jenica Costilow on 08/16/22 at  2:30 PM EDT by telephone and verified that I am speaking with the correct person using two identifiers.  Location: Patient: Home Provider: Office Persons participating in the virtual visit: patient/Nurse Health Advisor   I discussed the limitations, risks, security and privacy concerns of performing an evaluation and management service by telephone and the availability of in person appointments. The patient expressed understanding and agreed to proceed.  Interactive audio and video telecommunications were attempted between this nurse and patient, however failed, due to patient having technical difficulties OR patient did not have access to video capability.  We continued and completed visit with audio only.  Some vital signs may be absent or patient reported.   Criselda Peaches, LPN  Cardiac Risk Factors include: advanced age (>56men, >36 women);hypertension     Objective:    Today's Vitals   08/16/22 1427  Weight: 123 lb (55.8 kg)  Height: 5\' 4"  (1.626 m)   Body mass index is 21.11 kg/m.     08/16/2022    2:36 PM 07/06/2022    3:20 PM 07/06/2022    7:59 AM 06/28/2022   12:35 PM 06/26/2022    6:06 AM 05/16/2022    3:15 PM 04/28/2022    9:21 AM  Advanced Directives  Does Patient Have a Medical Advance Directive? Yes  Yes Yes No Yes Yes  Type of Paramedic of [place];Living will  Healthcare Power of [place];Living will  [place];Living will [place];Living will  Does patient want to make changes to medical advance directive? No - Patient declined No - Patient declined  No - Patient declined     Copy of [place] in Chart? Yes - validated most  recent copy scanned in chart (See row information)  No - copy requested No - copy requested     Would patient like information on creating a medical advance directive?    No - Patient declined       Current Medications (verified) Outpatient Encounter Medications as of 08/16/2022  Medication Sig   Ascorbic Acid (VITAMIN C) 500 MG tablet Take 2,000 mg by mouth daily. Reported on 02/04/2016   aspirin EC 81 MG tablet Take 1 tablet (81 mg total) by mouth daily.   Biotin 1000 MCG tablet Take 1,000 mcg by mouth daily. Reported on 02/04/2016   Coenzyme Q10 200 MG capsule Take 100 mg by mouth daily.   famotidine (PEPCID) 20 MG tablet Take 1 tablet (20 mg total) by mouth at bedtime.   levothyroxine (SYNTHROID) 75 MCG tablet TAKE 1 TABLET BY MOUTH DAILY BEFORE BREAKFAST.   Lutein 20 MG CAPS Take 20 mg by mouth daily.   ondansetron (ZOFRAN-ODT) 4 MG disintegrating tablet Take 1 tablet (4 mg total) by mouth every 8 (eight) hours as needed for nausea or vomiting.   pantoprazole (PROTONIX) 40 MG tablet TAKE 1 TABLET BY MOUTH EVERY DAY   Potassium Gluconate 595 MG CAPS Take 595 mg by mouth daily.    pravastatin (PRAVACHOL) 40 MG tablet TAKE 1 TABLET BY MOUTH EVERY DAY   No facility-administered encounter medications on file as of 08/16/2022.    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  Could be due to hernia or old scar tissue.  prev with CT done w/o alarming findings.  Will treat episodically.  Use tylenol #3 prn.     Hyperlipidemia    Lipoma of thigh    Left   Normal cardiac stress test 2014   PONV (postoperative nausea and vomiting)    nausea after hysterectomy, and 2 days after  lung surgery 10/2015   Thyroid cancer ([place]) 12/2015   Thyroid nodule    Past Surgical History:  Procedure Laterality Date   ABDOMINAL HYSTERECTOMY     BREAST EXCISIONAL BIOPSY Right 1977   BREAST MASS EXCISION  1977   benign   CARPAL TUNNEL RELEASE Right 1978   CATARACT EXTRACTION, BILATERAL Bilateral    CHOLECYSTECTOMY  11/23/2016   Procedure: LAPAROSCOPIC CHOLECYSTECTOMY;  Surgeon: Clayburn Pert, MD;  Location: [facility];  Service: General;;   COLONOSCOPY  2009   COLONOSCOPY WITH PROPOFOL N/A 05/18/2019   Procedure: COLONOSCOPY WITH PROPOFOL;  Surgeon: Virgel Manifold, MD;  Location: [facility];  Service: Endoscopy;  Laterality: N/A;   COLONOSCOPY WITH PROPOFOL N/A 08/15/2019   Procedure: COLONOSCOPY WITH PROPOFOL;  Surgeon: Virgel Manifold, MD;  Location: [facility];  Service: Endoscopy;  Laterality: N/A;   DILATION AND CURETTAGE OF UTERUS  2001   ELECTROMAGNETIC NAVIGATION BROCHOSCOPY N/A 08/25/2015   Procedure: ELECTROMAGNETIC NAVIGATION BRONCHOSCOPY;  Surgeon: Flora Lipps, MD;  Location: [facility];  Service: Cardiopulmonary;  Laterality: N/A;   ENDOBRONCHIAL ULTRASOUND N/A 08/25/2015   Procedure: ENDOBRONCHIAL ULTRASOUND;  Surgeon: Flora Lipps, MD;  Location: [facility];  Service: Cardiopulmonary;  Laterality: N/A;   ESOPHAGOGASTRODUODENOSCOPY  2002, 11/15/07   Normal (Dr. Allyn Kenner)   ESOPHAGOGASTRODUODENOSCOPY (EGD) WITH PROPOFOL N/A 05/18/2019   Procedure: ESOPHAGOGASTRODUODENOSCOPY (EGD) WITH PROPOFOL;  Surgeon: Virgel Manifold, MD;  Location: [facility];  Service: Endoscopy;  Laterality: N/A;   ESOPHAGOGASTRODUODENOSCOPY (EGD) WITH PROPOFOL N/A 08/15/2019   Procedure: ESOPHAGOGASTRODUODENOSCOPY (EGD) WITH PROPOFOL;  Surgeon: Virgel Manifold, MD;  Location: [facility];  Service: Endoscopy;  Laterality: N/A;   FACIAL COSMETIC SURGERY  01/13/2010   mini facelift   HERNIA REPAIR  1976   INSERTION OF MESH  07/06/2022   Procedure: INSERTION OF MESH;   Surgeon: Herbert Pun, MD;  Location: [facility];  Service: General;;   OOPHORECTOMY     PTOSIS REPAIR  12/08/2021   THORACOTOMY/LOBECTOMY Right 10/27/2015   Procedure: THORACOTOMY/LOBECTOMY;  Surgeon: Nestor Lewandowsky, MD;  Location: [facility];  Service: Thoracic;  Laterality: Right;   THYROIDECTOMY N/A 12/29/2015   Procedure: THYROIDECTOMY;  Surgeon: Emberleigh Reily Gust, MD;  Location: [facility];  Service: ENT;  Laterality: N/A;   TOTAL VAGINAL HYSTERECTOMY  12/30/2008   for fibroid pain (Dr. Gertie Fey)   [place] N/A 06/28/2022   Procedure: XI ROBOTIC [place];  Surgeon: Herbert Pun, MD;  Location: [facility];  Service: General;  Laterality: N/A;   Family History  Problem Relation Age of Onset   Transient ischemic attack Mother    Hypertension Mother    Stroke Mother        mini strokes   Hypertension Sister    Cancer Sister        ovarian   Hypertension Sister    Hypertension Sister  Hypertension Sister    Breast cancer Neg Hx    Colon cancer Neg Hx    Social History   Socioeconomic History   Marital status: Married    Spouse name: Not on file   Number of children: Not on file   Years of education: Not on file   Highest education level: Not on file  Occupational History   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  He has short term memory loss.     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   Previously had an adopted son who died in 01-14-2020.   Social Determinants of Health   Financial Resource Strain: Low Risk  (08/16/2022)    Overall Financial Resource Strain (CARDIA)    Difficulty of Paying Living Expenses: Not hard at all  Food Insecurity: No Food Insecurity (08/16/2022)   Hunger Vital Sign    Worried About Running Out of Food in the Last Year: Never true    Ran Out of Food in the Last Year: Never true  Transportation Needs: No Transportation Needs (08/16/2022)   PRAPARE - Hydrologist (Medical): No    Lack of Transportation (Non-Medical): No  Physical Activity: Sufficiently Active (08/16/2022)   Exercise Vital Sign    Days of Exercise per Week: 5 days    Minutes of Exercise per Session: 30 min  Stress: No Stress Concern Present (08/16/2022)   [place]    Feeling of Stress : Not at all  Social Connections: [place] (08/16/2022)   Social Connection and Isolation Panel [NHANES]    Frequency of Communication with Friends and Family: More than three times a week    Frequency of Social Gatherings with Friends and Family: More than three times a week    Attends Religious Services: More than 4 times per year    Active Member of [employer] or Organizations: Yes    Attends Music therapist: More than 4 times per year    Marital Status: Married    Tobacco Counseling Counseling given: Not Answered   Clinical Intake:  Pre-visit preparation completed: No  Pain : No/denies pain     BMI - recorded: 21.11 Nutritional Status: BMI of 19-24  Normal Nutritional Risks: None Diabetes: No  How often do you need to have someone help you when you read instructions, pamphlets, or other written materials from your doctor or pharmacy?: 1 - Never  Diabetic?  No  Interpreter Needed?: No  Information entered by :: Romanita Fager LPN   Activities of Daily Living    08/16/2022    2:34 PM 07/06/2022    3:20 PM  In your present state of health, do you have any difficulty performing the following  activities:  Hearing? 1 1  Comment Wears hearing aids   Vision? 0 0  Difficulty concentrating or making decisions? 0 0  Walking or climbing stairs? 0 0  Dressing or bathing? 0 0  Doing errands, shopping? 0 0  Preparing Food and eating ? N   Using the Toilet? N   In the past six months, have you accidently leaked urine? N   Do you have problems with loss of bowel control? N   Managing your Medications? N   Managing your Finances? N   Housekeeping or managing your Housekeeping? N     Patient Care Team: Tonia Ghent, MD as PCP - General (Family Medicine) Kate Sable, MD as PCP - Cardiology (Cardiology) Janann Boeve Gust, MD as Consulting Physician (Otolaryngology) Nestor Lewandowsky, MD (Inactive) as Consulting Physician (Cardiothoracic Surgery) Cammie Sickle, MD as Consulting Physician (Internal Medicine) Birder Robson, MD as Consulting Physician (Ophthalmology) Clayburn Pert, MD as Consulting Physician (General Surgery) Charlton Haws, [facility] as Pharmacist (Pharmacist)  Indicate any recent Medical Services you may have received from other than [facility] providers in the past year (date may be approximate).     Assessment:   This is a routine wellness examination for Aniyiah.  Hearing/Vision screen Hearing Screening - Comments:: Wears Hearing Aids Vision Screening - Comments:: Wears rx glasses - up to date with routine eye exams with  [facility]  Dietary issues and exercise activities discussed: Current Exercise Habits: Home exercise routine, Type of exercise: walking, Time (Minutes): 30, Frequency (Times/Week): 5, Weekly Exercise (Minutes/Week): 150, Intensity: Mild, Exercise limited by: None identified   Goals Addressed               This Visit's Progress     Increase physical activity (pt-stated)        I will continue to exercise for at least 30 min daily.        Depression Screen    08/16/2022    2:32 PM 07/01/2021    2:16 PM  06/10/2020    2:00 PM 06/27/2019    2:21 PM 05/23/2017   10:40 AM 05/20/2016    8:40 AM 05/21/2015    9:42 AM  PHQ 2/9 Scores  PHQ - 2 Score 0 0 0 0 1 0 0  PHQ- 9 Score  3 0        Fall Risk    08/16/2022    2:35 PM 02/14/2022    3:44 PM 10/20/2020    2:26 PM 06/10/2020    2:00 PM 06/27/2019    2:21 PM  [place] in the past year? 0 0 0 0 0  Number falls in past yr: 0 0 0 0   Injury with Fall? 0 0  0   Risk for fall due to : No Fall Risks No Fall Risks  Medication side effect   Follow up Falls prevention discussed Falls evaluation completed Falls evaluation completed Falls evaluation completed;Falls prevention discussed     FALL RISK PREVENTION PERTAINING TO THE HOME:  Any stairs in or around the home? No  If so, are there any without handrails? No  Home free of loose throw rugs in walkways, pet beds, electrical cords, etc? Yes  Adequate lighting in your home to reduce risk of falls? Yes   ASSISTIVE DEVICES UTILIZED TO PREVENT FALLS:  Life alert? No  Use of a cane, walker or w/c? No  Grab bars in the bathroom? Yes  Shower chair or bench in shower? Yes  Elevated toilet seat or a handicapped toilet? Yes   TIMED UP AND GO:  Was the test performed? No . Audio Visit  Cognitive Function:    06/10/2020    2:02 PM 05/23/2017   10:41 AM  05/20/2016    8:38 AM  MMSE - Mini Mental State Exam  Orientation to time 5 5 5   Orientation to Place 5 5 5   Registration 3 3 3   Attention/ Calculation 5 0 0  Recall 3 3 3   Language- name 2 objects  0 0  Language- repeat 1 1 1   Language- follow 3 step command  3 3  Language- read & follow direction  0 0  Write a sentence  0 0  Copy design  0 0  Total score  20 20        08/16/2022    2:36 PM  6CIT Screen  What Year? 0 points  What month? 0 points  What time? 0 points  Count back from 20 0 points  Months in reverse 0 points  Repeat phrase 0 points  Total Score 0 points    Immunizations Immunization History   Administered Date(s) Administered   Fluad Quad(high Dose 65+) 06/27/2019, 07/01/2021   Influenza Split 08/10/2012   Influenza, High Dose Seasonal PF 08/01/2014, 08/10/2016, 07/18/2017, 07/12/2018, 07/29/2020   Influenza,inj,Quad PF,6+ Mos 08/13/2015   Influenza-Unspecified 07/01/2013, 07/01/2014, 07/18/2017   PFIZER(Purple Top)SARS-COV-2 Vaccination 11/21/2019, 12/12/2019, 11/12/2020   Pneumococcal Conjugate-13 05/13/2014   Pneumococcal Polysaccharide-23 02/17/2010   Td 02/11/2009   Zoster, Live 05/07/2010    TDAP status: Due, Education has been provided regarding the importance of this vaccine. Advised may receive this vaccine at local pharmacy or Health Dept. Aware to provide a copy of the vaccination record if obtained from local pharmacy or Health Dept. Verbalized acceptance and understanding.  Flu Vaccine status: Up to date  Pneumococcal vaccine status: Up to date  Covid-19 vaccine status: Completed vaccines  Qualifies for Shingles Vaccine? Yes   Zostavax completed No   Shingrix Completed?: No.    Education has been provided regarding the importance of this vaccine. Patient has been advised to call insurance company to determine out of pocket expense if they have not yet received this vaccine. Advised may also receive vaccine at local pharmacy or Health Dept. Verbalized acceptance and understanding.  Screening Tests Health Maintenance  Topic Date Due   COVID-19 Vaccine (4 - Pfizer risk series) 09/01/2022 (Originally 01/07/2021)   Zoster Vaccines- Shingrix (1 of 2) 11/16/2022 (Originally 05/17/1965)   INFLUENZA VACCINE  01/29/2023 (Originally 05/31/2022)   TETANUS/TDAP  06/10/2024 (Originally 02/12/2019)   Pneumonia Vaccine 22+ Years old  Completed   DEXA SCAN  Completed   Hepatitis C Screening  Completed   HPV VACCINES  Aged Out   COLONOSCOPY (Pts 45-69yrs Insurance coverage will need to be confirmed)  Discontinued    Health Maintenance  There are no preventive care  reminders to display for this patient.   Colorectal cancer screening: No longer required.   Mammogram status: No longer required due to Age.  Bone Density status: Completed 11/27/19. Results reflect: Bone density results: OSTEOPOROSIS. Repeat every   years.  Lung Cancer Screening: (Low Dose CT Chest recommended if Age 59-80 years, 30 pack-year currently smoking OR have quit w/in 15years.) does not qualify.     Additional Screening:  Hepatitis C Screening: does qualify; Completed 05/20/16  Vision Screening: Recommended annual ophthalmology exams for early detection of glaucoma and other disorders of the eye. Is the patient up to date with their annual eye exam?  Yes  Who is the provider or what is the name of the office in which the patient attends annual eye exams? [place] If pt is not established with  a provider, would they like to be referred to a provider to establish care? No .   Dental Screening: Recommended annual dental exams for proper oral hygiene  Community Resource Referral / Chronic Care Management:  CRR required this visit?  No   CCM required this visit?  No      Plan:     I have personally reviewed and noted the following in the patient's chart:   Medical and social history Use of alcohol, tobacco or illicit drugs  Current medications and supplements including opioid prescriptions. Patient is not currently taking opioid prescriptions. Functional ability and status Nutritional status Physical activity Advanced directives List of other physicians Hospitalizations, surgeries, and ER visits in previous 12 months Vitals Screenings to include cognitive, depression, and falls Referrals and appointments  In addition, I have reviewed and discussed with patient certain preventive protocols, quality metrics, and best practice recommendations. A written personalized care plan for preventive services as well as general preventive health recommendations were  provided to patient.     Criselda Peaches, LPN   [id number]   Nurse Notes: None

## 2022-08-16 NOTE — Patient Instructions (Addendum)
[name] , Thank you for taking time to come for your Medicare Wellness Visit. I appreciate your ongoing commitment to your health goals. Please review the following plan we discussed and let me know if I can assist you in the future.   These are the goals we discussed:  Goals       Increase physical activity (pt-stated)      I will continue to exercise for at least 30 min daily.       Patient Stated      06/10/2020, I will continue to walk for about 2-3 days a week for 1 hour.       Track and Manage My Blood Pressure-Hypertension      Timeframe:  Long-Range Goal Priority:  Medium Start Date:       12/13/21                      Expected End Date:   12/13/22                    Follow Up Date Feb 2024   - check blood pressure 3 times per week - choose a place to take my blood pressure (home, clinic or office, retail store) - write blood pressure results in a log or diary    Why is this important?   You won't feel high blood pressure, but it can still hurt your blood vessels.  High blood pressure can cause heart or kidney problems. It can also cause a stroke.  Making lifestyle changes like losing a little weight or eating less salt will help.  Checking your blood pressure at home and at different times of the day can help to control blood pressure.  If the doctor prescribes medicine remember to take it the way the doctor ordered.  Call the office if you cannot afford the medicine or if there are questions about it.     Notes:         This is a list of the screening recommended for you and due dates:  Health Maintenance  Topic Date Due   COVID-19 Vaccine (4 - Pfizer risk series) 09/01/2022*   Zoster (Shingles) Vaccine (1 of 2) 11/16/2022*   Flu Shot  01/29/2023*   Tetanus Vaccine  06/10/2024*   Pneumonia Vaccine  Completed   DEXA scan (bone density measurement)  Completed   Hepatitis C Screening: USPSTF Recommendation to screen - Ages 54-79 yo.  Completed   HPV Vaccine   Aged Out   Colon Cancer Screening  Discontinued  *Topic was postponed. The date shown is not the original due date.    Advanced directives: In Chart  Conditions/risks identified: None  Next appointment: Follow up in one year for your annual wellness visit     Preventive Care 65 Years and Older, Female Preventive care refers to lifestyle choices and visits with your health care provider that can promote health and wellness. What does preventive care include? A yearly physical exam. This is also called an annual well check. Dental exams once or twice a year. Routine eye exams. Ask your health care provider how often you should have your eyes checked. Personal lifestyle choices, including: Daily care of your teeth and gums. Regular physical activity. Eating a healthy diet. Avoiding tobacco and drug use. Limiting alcohol use. Practicing safe sex. Taking low-dose aspirin every day. Taking vitamin and mineral supplements as recommended by your health care provider. What happens during an annual well  check? The services and screenings done by your health care provider during your annual well check will depend on your age, overall health, lifestyle risk factors, and family history of disease. Counseling  Your health care provider may ask you questions about your: Alcohol use. Tobacco use. Drug use. Emotional well-being. Home and relationship well-being. Sexual activity. Eating habits. History of falls. Memory and ability to understand (cognition). Work and work Statistician. Reproductive health. Screening  You may have the following tests or measurements: Height, weight, and BMI. Blood pressure. Lipid and cholesterol levels. These may be checked every 5 years, or more frequently if you are over 53 years old. Skin check. Lung cancer screening. You may have this screening every year starting at age 63 if you have a 30-pack-year history of smoking and currently smoke or have quit  within the past 15 years. Fecal occult blood test (FOBT) of the stool. You may have this test every year starting at age 5. Flexible sigmoidoscopy or colonoscopy. You may have a sigmoidoscopy every 5 years or a colonoscopy every 10 years starting at age 74. Hepatitis C blood test. Hepatitis B blood test. Sexually transmitted disease (STD) testing. Diabetes screening. This is done by checking your blood sugar (glucose) after you have not eaten for a while (fasting). You may have this done every 1-3 years. Bone density scan. This is done to screen for osteoporosis. You may have this done starting at age 29. Mammogram. This may be done every 1-2 years. Talk to your health care provider about how often you should have regular mammograms. Talk with your health care provider about your test results, treatment options, and if necessary, the need for more tests. Vaccines  Your health care provider may recommend certain vaccines, such as: Influenza vaccine. This is recommended every year. Tetanus, diphtheria, and acellular pertussis (Tdap, Td) vaccine. You may need a Td booster every 10 years. Zoster vaccine. You may need this after age 69. Pneumococcal 13-valent conjugate (PCV13) vaccine. One dose is recommended after age 32. Pneumococcal polysaccharide (PPSV23) vaccine. One dose is recommended after age 14. Talk to your health care provider about which screenings and vaccines you need and how often you need them. This information is not intended to replace advice given to you by your health care provider. Make sure you discuss any questions you have with your health care provider. Document Released: 11/13/2015 Document Revised: 07/06/2016 Document Reviewed: 08/18/2015 Elsevier Interactive Patient Education  2017 [place] Prevention in the Home Falls can cause injuries. They can happen to people of all ages. There are many things you can do to make your home safe and to help prevent  falls. What can I do on the outside of my home? Regularly fix the edges of walkways and driveways and fix any cracks. Remove anything that might make you trip as you walk through a door, such as a raised step or threshold. Trim any bushes or trees on the path to your home. Use bright outdoor lighting. Clear any walking paths of anything that might make someone trip, such as rocks or tools. Regularly check to see if handrails are loose or broken. Make sure that both sides of any steps have handrails. Any raised decks and porches should have guardrails on the edges. Have any leaves, snow, or ice cleared regularly. Use sand or salt on walking paths during winter. Clean up any spills in your garage right away. This includes oil or grease spills. What can I do in  the bathroom? Use night lights. Install grab bars by the toilet and in the tub and shower. Do not use towel bars as grab bars. Use non-skid mats or decals in the tub or shower. If you need to sit down in the shower, use a plastic, non-slip stool. Keep the floor dry. Clean up any water that spills on the floor as soon as it happens. Remove soap buildup in the tub or shower regularly. Attach bath mats securely with double-sided non-slip rug tape. Do not have throw rugs and other things on the floor that can make you trip. What can I do in the bedroom? Use night lights. Make sure that you have a light by your bed that is easy to reach. Do not use any sheets or blankets that are too big for your bed. They should not hang down onto the floor. Have a firm chair that has side arms. You can use this for support while you get dressed. Do not have throw rugs and other things on the floor that can make you trip. What can I do in the kitchen? Clean up any spills right away. Avoid walking on wet floors. Keep items that you use a lot in easy-to-reach places. If you need to reach something above you, use a strong step stool that has a grab  bar. Keep electrical cords out of the way. Do not use floor polish or wax that makes floors slippery. If you must use wax, use non-skid floor wax. Do not have throw rugs and other things on the floor that can make you trip. What can I do with my stairs? Do not leave any items on the stairs. Make sure that there are handrails on both sides of the stairs and use them. Fix handrails that are broken or loose. Make sure that handrails are as long as the stairways. Check any carpeting to make sure that it is firmly attached to the stairs. Fix any carpet that is loose or worn. Avoid having throw rugs at the top or bottom of the stairs. If you do have throw rugs, attach them to the floor with carpet tape. Make sure that you have a light switch at the top of the stairs and the bottom of the stairs. If you do not have them, ask someone to add them for you. What else can I do to help prevent falls? Wear shoes that: Do not have high heels. Have rubber bottoms. Are comfortable and fit you well. Are closed at the toe. Do not wear sandals. If you use a stepladder: Make sure that it is fully opened. Do not climb a closed stepladder. Make sure that both sides of the stepladder are locked into place. Ask someone to hold it for you, if possible. Clearly mark and make sure that you can see: Any grab bars or handrails. First and last steps. Where the edge of each step is. Use tools that help you move around (mobility aids) if they are needed. These include: Canes. Walkers. Scooters. Crutches. Turn on the lights when you go into a dark area. Replace any light bulbs as soon as they burn out. Set up your furniture so you have a clear path. Avoid moving your furniture around. If any of your floors are uneven, fix them. If there are any pets around you, be aware of where they are. Review your medicines with your doctor. Some medicines can make you feel dizzy. This can increase your chance of falling. Ask  your doctor  what other things that you can do to help prevent falls. This information is not intended to replace advice given to you by your health care provider. Make sure you discuss any questions you have with your health care provider. Document Released: 08/13/2009 Document Revised: 03/24/2016 Document Reviewed: 11/21/2014 Elsevier Interactive Patient Education  2017 [employer].

## 2022-08-19 ENCOUNTER — Other Ambulatory Visit (INDEPENDENT_AMBULATORY_CARE_PROVIDER_SITE_OTHER): Payer: PPO

## 2022-08-19 DIAGNOSIS — E7800 Pure hypercholesterolemia, unspecified: Secondary | ICD-10-CM

## 2022-08-19 DIAGNOSIS — E559 Vitamin D deficiency, unspecified: Secondary | ICD-10-CM

## 2022-08-19 LAB — LIPID PANEL
Cholesterol: 145 mg/dL (ref 0–200)
HDL: 40.6 mg/dL (ref >39.00)
LDL Cholesterol: 77 mg/dL (ref 0–99)
NonHDL: 103.94
Total CHOL/HDL Ratio: 4
Triglycerides: 137 mg/dL (ref 0.0–149.0)
VLDL: 27.4 mg/dL (ref 0.0–40.0)

## 2022-08-19 LAB — VITAMIN D 25 HYDROXY (VIT D DEFICIENCY, FRACTURES): VITD: 32.65 ng/mL (ref 30.00–100.00)

## 2022-08-25 ENCOUNTER — Ambulatory Visit (INDEPENDENT_AMBULATORY_CARE_PROVIDER_SITE_OTHER): Payer: PPO | Admitting: Family Medicine

## 2022-08-25 ENCOUNTER — Encounter: Payer: Self-pay | Admitting: Family Medicine

## 2022-08-25 VITALS — BP 110/80 | HR 76 | Temp 97.6°F | Ht 64.0 in | Wt 125.0 lb

## 2022-08-25 DIAGNOSIS — Z23 Encounter for immunization: Secondary | ICD-10-CM

## 2022-08-25 DIAGNOSIS — E039 Hypothyroidism, unspecified: Secondary | ICD-10-CM

## 2022-08-25 DIAGNOSIS — I83893 Varicose veins of bilateral lower extremities with other complications: Secondary | ICD-10-CM | POA: Diagnosis not present

## 2022-08-25 DIAGNOSIS — E7800 Pure hypercholesterolemia, unspecified: Secondary | ICD-10-CM | POA: Diagnosis not present

## 2022-08-25 DIAGNOSIS — Z7189 Other specified counseling: Secondary | ICD-10-CM

## 2022-08-25 DIAGNOSIS — Z0000 Encounter for general adult medical examination without abnormal findings: Secondary | ICD-10-CM

## 2022-08-25 MED ORDER — THERA VITAL M PO TABS: 1.0000 | ORAL_TABLET | Freq: Every day | ORAL | Status: DC

## 2022-08-25 MED ORDER — FAMOTIDINE 20 MG PO TABS: 20.0000 mg | ORAL_TABLET | Freq: Every day | ORAL | 3 refills | Status: DC

## 2022-08-25 NOTE — Progress Notes (Signed)
Elevated Cholesterol: Using medications without problems: yes Muscle aches: no Diet compliance: d/w pt.   Exercise: d/w pt.    Varicose veins.  She isn't having ulceration or bleeding.    Hypothyroidism. 24mcg daily, compliant.  Labs d/w pt.    Vaccines d/w pt.  Due for shingrix when possible.  flu 2023.   Mammogram 2023.   DXA 2021, okay to defer for now.  Consider repeat in 2024 with next mammogram.   Advance directive- d/w pt.  Has a living will.  Would have Wynn Banker, then her niece Magdalene Patricia designated in that order should patient be incapacitated.   Colonoscopy 2020, she wanted to defer as of now given her recent events.    She is feeling better from prev hospitalization.  She is walking, soon to return to exercise.  Some fatigue but that is getting better.  Some occ indigestion and d/w pt about continue pepcid at night.  Some nausea, getting better.  Used zofran prn, with relief.    PMH and SH reviewed Meds, vitals, and allergies reviewed.   ROS: Per HPI unless specifically indicated in ROS section   GEN: nad, alert and oriented HEENT: ncat NECK: supple w/o LA CV: rrr. PULM: ctab, no inc wob ABD: soft, +bs EXT: no edema SKIN: no acute rash Varicose veins noted on the legs bilaterally.  30 minutes were devoted to patient care in this encounter (this includes time spent reviewing the patient's file/history, interviewing and examining the patient, counseling/reviewing plan with patient).

## 2022-08-25 NOTE — Patient Instructions (Addendum)
Let me know when you get your next mammogram set up so I can order the bone density test.   Take care.  Glad to see you. Call GI about follow up after the new year.  Let me know if you have trouble getting an appointment.  Plan on follow up in Feb 2024.

## 2022-08-25 NOTE — Assessment & Plan Note (Signed)
Would have Wynn Banker, then her niece Magdalene Patricia designated in that order should patient be incapacitated.

## 2022-08-28 NOTE — Assessment & Plan Note (Signed)
Continue pravastatin.  Labs discussed with patient.

## 2022-08-28 NOTE — Assessment & Plan Note (Signed)
Vaccines d/w pt.  Due for shingrix when possible.  flu 2023.   Mammogram 2023.   DXA 2021, okay to defer for now.  Consider repeat in 2024 with next mammogram.   Advance directive- d/w pt.  Has a living will.  Would have Wynn Banker, then her niece Magdalene Patricia designated in that order should patient be incapacitated.   Colonoscopy 2020, she wanted to defer as of now given her recent events.

## 2022-08-28 NOTE — Assessment & Plan Note (Signed)
No bleeding.  She can update me as needed.

## 2022-08-28 NOTE — Assessment & Plan Note (Signed)
Continue levothyroxine 7mcg daily, compliant.  Labs d/w pt.

## 2022-08-29 ENCOUNTER — Encounter (INDEPENDENT_AMBULATORY_CARE_PROVIDER_SITE_OTHER): Payer: Self-pay

## 2022-09-13 ENCOUNTER — Emergency Department
Admission: EM | Admit: 2022-09-13 | Discharge: 2022-09-13 | Disposition: A | Discharge status: Home / Self Care | Payer: PPO | Attending: Emergency Medicine | Admitting: Emergency Medicine

## 2022-09-13 ENCOUNTER — Telehealth: Payer: Self-pay | Admitting: Gastroenterology

## 2022-09-13 ENCOUNTER — Other Ambulatory Visit: Payer: Self-pay

## 2022-09-13 ENCOUNTER — Emergency Department: Payer: PPO

## 2022-09-13 ENCOUNTER — Encounter: Payer: Self-pay | Admitting: Emergency Medicine

## 2022-09-13 DIAGNOSIS — Z86010 Personal history of colonic polyps: Secondary | ICD-10-CM

## 2022-09-13 DIAGNOSIS — R1033 Periumbilical pain: Secondary | ICD-10-CM | POA: Insufficient documentation

## 2022-09-13 DIAGNOSIS — R109 Unspecified abdominal pain: Secondary | ICD-10-CM | POA: Diagnosis not present

## 2022-09-13 DIAGNOSIS — R1031 Right lower quadrant pain: Secondary | ICD-10-CM | POA: Diagnosis present

## 2022-09-13 LAB — CBC WITH DIFFERENTIAL/PLATELET
Abs Immature Granulocytes: 0.03 10*3/uL (ref 0.00–0.07)
Basophils Absolute: 0.1 10*3/uL (ref 0.0–0.1)
Basophils Relative: 1 %
Eosinophils Absolute: 0.1 10*3/uL (ref 0.0–0.5)
Eosinophils Relative: 2 %
HCT: 40.2 % (ref 36.0–46.0)
Hemoglobin: 13 g/dL (ref 12.0–15.0)
Immature Granulocytes: 1 %
Lymphocytes Relative: 24 %
Lymphs Abs: 1.5 10*3/uL (ref 0.7–4.0)
MCH: 30.5 pg (ref 26.0–34.0)
MCHC: 32.3 g/dL (ref 30.0–36.0)
MCV: 94.4 fL (ref 80.0–100.0)
Monocytes Absolute: 0.5 10*3/uL (ref 0.1–1.0)
Monocytes Relative: 8 %
Neutro Abs: 3.9 10*3/uL (ref 1.7–7.7)
Neutrophils Relative %: 64 %
Platelets: 160 10*3/uL (ref 150–400)
RBC: 4.26 MIL/uL (ref 3.87–5.11)
RDW: 15.9 % — ABNORMAL HIGH (ref 11.5–15.5)
WBC: 6 10*3/uL (ref 4.0–10.5)
nRBC: 0 % (ref 0.0–0.2)

## 2022-09-13 LAB — COMPREHENSIVE METABOLIC PANEL
ALT: 20 U/L (ref 0–44)
AST: 25 U/L (ref 15–41)
Albumin: 4 g/dL (ref 3.5–5.0)
Alkaline Phosphatase: 49 U/L (ref 38–126)
Anion gap: 7 (ref 5–15)
BUN: 14 mg/dL (ref 8–23)
CO2: 28 mmol/L (ref 22–32)
Calcium: 9.1 mg/dL (ref 8.9–10.3)
Chloride: 105 mmol/L (ref 98–111)
Creatinine, Ser: 0.69 mg/dL (ref 0.44–1.00)
GFR, Estimated: >60 mL/min (ref >60)
Glucose, Bld: 125 mg/dL — ABNORMAL HIGH (ref 70–99)
Potassium: 4.3 mmol/L (ref 3.5–5.1)
Sodium: 140 mmol/L (ref 135–145)
Total Bilirubin: 0.8 mg/dL (ref 0.3–1.2)
Total Protein: 6.9 g/dL (ref 6.5–8.1)

## 2022-09-13 LAB — URINALYSIS, ROUTINE W REFLEX MICROSCOPIC
Bilirubin Urine: NEGATIVE
Glucose, UA: NEGATIVE mg/dL
Hgb urine dipstick: NEGATIVE
Ketones, ur: NEGATIVE mg/dL
Leukocytes,Ua: NEGATIVE
Nitrite: NEGATIVE
Protein, ur: NEGATIVE mg/dL
Specific Gravity, Urine: 1.006 (ref 1.005–1.030)
pH: 7 (ref 5.0–8.0)

## 2022-09-13 LAB — LIPASE, BLOOD: Lipase: 37 U/L (ref 11–51)

## 2022-09-13 MED ORDER — IOHEXOL 300 MG/ML  SOLN
100.0000 mL | Freq: Once | INTRAMUSCULAR | Status: AC | PRN
  Administered 2022-09-13: 100 mL via INTRAVENOUS

## 2022-09-13 NOTE — Telephone Encounter (Signed)
Patient wants to schedule it for 11/04/2021. Sent out new instructions to patient

## 2022-09-13 NOTE — Telephone Encounter (Signed)
Patient calling to reschedule colonoscopy. Requests a call back from [place].

## 2022-09-13 NOTE — Telephone Encounter (Signed)
Patient wants to talk to her sister about the days and when she finds out what day she can do she will call her me back

## 2022-09-13 NOTE — ED Provider Notes (Signed)
[facility] Provider Note    Event Date/Time   First MD Initiated Contact with Patient 09/13/22 0915     (approximate)   History   Abdominal Pain   HPI  [name] is a 76 y.o. female with recent history of bowel resection followed by femoral hernia repairs who presents with complaints of lower abdominal pain, particularly in the right lower quadrant.  She denies fevers or chills.  No nausea or vomiting.  She reports the symptoms have been intermittent over the last week but seem to have become more consistent.  Was sent to the emergency department by her surgeons office.     Physical Exam   Triage Vital Signs: ED Triage Vitals  Enc Vitals Group     BP 09/13/22 0852 (!) 167/90     Pulse Rate 09/13/22 0852 84     Resp 09/13/22 0852 16     Temp 09/13/22 0852 98 F (36.7 C)     Temp Source 09/13/22 0852 Oral     SpO2 09/13/22 0852 97 %     Weight 09/13/22 0856 56.7 kg (125 lb)     Height 09/13/22 0856 1.626 m (5\' 4" )     Head Circumference --      Peak Flow --      Pain Score 09/13/22 0856 4     Pain Loc --      Pain Edu? --      Excl. in [place]? --     Most recent vital signs: Vitals:   09/13/22 0852  BP: (!) 167/90  Pulse: 84  Resp: 16  Temp: 98 F (36.7 C)  SpO2: 97%     General: Awake, no distress.  CV:  Good peripheral perfusion.  Resp:  Normal effort.  Abd:  No distention.  Soft, mild tenderness tperiumbilically Other:     ED Results / Procedures / Treatments   Labs (all labs ordered are listed, but only abnormal results are displayed) Labs Reviewed  COMPREHENSIVE METABOLIC PANEL - Abnormal; Notable for the following components:      Result Value   Glucose, Bld 125 (*)    All other components within normal limits  CBC WITH DIFFERENTIAL/PLATELET - Abnormal; Notable for the following components:   RDW 15.9 (*)    All other components within normal limits  URINALYSIS, ROUTINE W REFLEX MICROSCOPIC - Abnormal; Notable for  the following components:   Color, Urine YELLOW (*)    APPearance CLEAR (*)    All other components within normal limits  LIPASE, BLOOD     EKG     RADIOLOGY CT abdomen pelvis viewed interpreted by me, no acute abnormality noted, pending radiology read    PROCEDURES:  Critical Care performed:   Procedures   MEDICATIONS ORDERED IN ED: Medications  iohexol (OMNIPAQUE) 300 MG/ML solution 100 mL (100 mLs Intravenous Contrast Given 09/13/22 0959)     IMPRESSION / MDM / [place] / ED COURSE  I reviewed the triage vital signs and the nursing notes. Patient's presentation is most consistent with acute presentation with potential threat to life or bodily function.   Patient presents with abdominal pain as detailed above.  Recent significant surgical history.   Differential includes postoperative pain, adhesions, SBO, infection  Mild tenderness periumbilically, lab work reviewed and is overall reassuring, will send for CT abdomen pelvis  CT scan is reassuring, patient seen by Dr. Windell Moment in the emergency department, appreciate his consultation.  He notes she  is appropriate for discharge at this time      FINAL CLINICAL IMPRESSION(S) / ED DIAGNOSES   Final diagnoses:  Periumbilical abdominal pain     Rx / DC Orders   ED Discharge Orders     None        Note:  This document was prepared using Dragon voice recognition software and may include unintentional dictation errors.   Lavonia Drafts, MD 09/13/22 1100

## 2022-09-13 NOTE — ED Triage Notes (Signed)
Pt here with abd pain. Pt had bowel resection on 06/28/2022 and then gad hernia repair surgery on 07/06/2022 but then started having umbilical pain since last week. Pt states the RLQ pain would come and go but it has not went away. Pt also has some abd swelling. Surgeon advised pt to come and be evaluated in the ED.

## 2022-11-04 ENCOUNTER — Encounter
Admission: RE | Disposition: A | Discharge status: Home / Self Care | Payer: Self-pay | Source: Home / Self Care | Attending: Gastroenterology

## 2022-11-04 ENCOUNTER — Ambulatory Visit: Payer: PPO | Admitting: Anesthesiology

## 2022-11-04 ENCOUNTER — Ambulatory Visit
Admission: RE | Admit: 2022-11-04 | Discharge: 2022-11-04 | Disposition: A | Discharge status: Home / Self Care | Payer: PPO | Attending: Gastroenterology | Admitting: Gastroenterology

## 2022-11-04 ENCOUNTER — Encounter: Payer: Self-pay | Admitting: Gastroenterology

## 2022-11-04 DIAGNOSIS — Z85118 Personal history of other malignant neoplasm of bronchus and lung: Secondary | ICD-10-CM | POA: Insufficient documentation

## 2022-11-04 DIAGNOSIS — Z860100 Personal history of colon polyps, unspecified: Secondary | ICD-10-CM

## 2022-11-04 DIAGNOSIS — E890 Postprocedural hypothyroidism: Secondary | ICD-10-CM | POA: Insufficient documentation

## 2022-11-04 DIAGNOSIS — Z85850 Personal history of malignant neoplasm of thyroid: Secondary | ICD-10-CM | POA: Diagnosis not present

## 2022-11-04 DIAGNOSIS — D123 Benign neoplasm of transverse colon: Secondary | ICD-10-CM | POA: Diagnosis not present

## 2022-11-04 DIAGNOSIS — K219 Gastro-esophageal reflux disease without esophagitis: Secondary | ICD-10-CM | POA: Insufficient documentation

## 2022-11-04 DIAGNOSIS — I10 Essential (primary) hypertension: Secondary | ICD-10-CM | POA: Insufficient documentation

## 2022-11-04 DIAGNOSIS — E785 Hyperlipidemia, unspecified: Secondary | ICD-10-CM | POA: Insufficient documentation

## 2022-11-04 DIAGNOSIS — Z9049 Acquired absence of other specified parts of digestive tract: Secondary | ICD-10-CM | POA: Insufficient documentation

## 2022-11-04 DIAGNOSIS — E039 Hypothyroidism, unspecified: Secondary | ICD-10-CM | POA: Diagnosis not present

## 2022-11-04 DIAGNOSIS — Z902 Acquired absence of lung [part of]: Secondary | ICD-10-CM | POA: Diagnosis not present

## 2022-11-04 DIAGNOSIS — Z09 Encounter for follow-up examination after completed treatment for conditions other than malignant neoplasm: Secondary | ICD-10-CM | POA: Diagnosis present

## 2022-11-04 DIAGNOSIS — K635 Polyp of colon: Secondary | ICD-10-CM | POA: Diagnosis not present

## 2022-11-04 HISTORY — PX: COLONOSCOPY WITH PROPOFOL: SHX5780

## 2022-11-04 SURGERY — COLONOSCOPY WITH PROPOFOL
Anesthesia: General

## 2022-11-04 MED ORDER — PROPOFOL 10 MG/ML IV BOLUS
INTRAVENOUS | Status: DC | PRN
  Administered 2022-11-04: 20 mg via INTRAVENOUS
  Administered 2022-11-04: 50 mg via INTRAVENOUS
  Administered 2022-11-04: 30 mg via INTRAVENOUS

## 2022-11-04 MED ORDER — PROPOFOL 500 MG/50ML IV EMUL
INTRAVENOUS | Status: DC | PRN
  Administered 2022-11-04: 125 ug/kg/min via INTRAVENOUS

## 2022-11-04 MED ORDER — SODIUM CHLORIDE 0.9 % IV SOLN: INTRAVENOUS | Status: DC

## 2022-11-04 MED ORDER — LIDOCAINE HCL (CARDIAC) PF 100 MG/5ML IV SOSY
PREFILLED_SYRINGE | INTRAVENOUS | Status: DC | PRN
  Administered 2022-11-04: 20 mg via INTRAVENOUS

## 2022-11-04 NOTE — Op Note (Signed)
[facility] Patient Name: Arlyne Brandes Procedure Date: 11/04/2022 9:10 AM MRN: [id number] Account #: [id number] Date of Birth: 10-05-1946 Admit Type: Outpatient Age: 77 Room: [facility] [place] ENDO ROOM 2 Gender: Female Note Status: Finalized Instrument Name: Peds Colonoscope [id number] Procedure:             Colonoscopy Indications:           High risk colon cancer surveillance: Personal history                         of colonic polyps, Last colonoscopy: October 2020 Providers:             Lin Landsman MD, MD Referring MD:          Elveria Rising. Damita Dunnings, MD (Referring MD) Medicines:             General Anesthesia Complications:         No immediate complications. Estimated blood loss:                         Minimal. Procedure:             Pre-Anesthesia Assessment:                        - Prior to the procedure, a History and Physical was                         performed, and patient medications and allergies were                         reviewed. The patient is competent. The risks and                         benefits of the procedure and the sedation options and                         risks were discussed with the patient. All questions                         were answered and informed consent was obtained.                         Patient identification and proposed procedure were                         verified by the physician, the nurse, the                         anesthesiologist, the anesthetist and the technician                         in the pre-procedure area in the procedure room in the                         endoscopy suite. Mental Status Examination: alert and                         oriented. Airway Examination: normal oropharyngeal  airway and neck mobility. Respiratory Examination:                         clear to auscultation. CV Examination: normal.                         Prophylactic Antibiotics: The patient  does not require                         prophylactic antibiotics. Prior Anticoagulants: The                         patient has taken no anticoagulant or antiplatelet                         agents. ASA Grade Assessment: III - A patient with                         severe systemic disease. After reviewing the risks and                         benefits, the patient was deemed in satisfactory                         condition to undergo the procedure. The anesthesia                         plan was to use general anesthesia. Immediately prior                         to administration of medications, the patient was                         re-assessed for adequacy to receive sedatives. The                         heart rate, respiratory rate, oxygen saturations,                         blood pressure, adequacy of pulmonary ventilation, and                         response to care were monitored throughout the                         procedure. The physical status of the patient was                         re-assessed after the procedure.                        After obtaining informed consent, the colonoscope was                         passed under direct vision. Throughout the procedure,                         the patient's blood pressure, pulse, and oxygen  saturations were monitored continuously. The                         Colonoscope was introduced through the anus and                         advanced to the the cecum, identified by appendiceal                         orifice and ileocecal valve. The colonoscopy was                         performed without difficulty. The patient tolerated                         the procedure well. The quality of the bowel                         preparation was evaluated using the BBPS [facility] Bowel                         Preparation Scale) with scores of: Right Colon = 3,                         Transverse Colon = 3 and Left  Colon = 3 (entire mucosa                         seen well with no residual staining, small fragments                         of stool or opaque liquid). The total BBPS score                         equals 9. The ileocecal valve, appendiceal orifice,                         and rectum were photographed. Findings:      The perianal and digital rectal examinations were normal. Pertinent       negatives include normal sphincter tone and no palpable rectal lesions.      A diminutive polyp was found in the transverse colon. The polyp was       sessile. The polyp was removed with a jumbo cold forceps. Resection and       retrieval were complete. Estimated blood loss: none.      A tattoo was seen in the ascending colon. A post-polypectomy scar was       found at the tattoo site. There was no evidence of residual polyp tissue.      The retroflexed view of the distal rectum and anal verge was normal and       showed no anal or rectal abnormalities.      The exam was otherwise without abnormality. Impression:            - One diminutive polyp in the transverse colon,                         removed with a jumbo cold forceps. Resected and  retrieved.                        - A tattoo was seen in the ascending colon. A                         post-polypectomy scar was found at the tattoo site.                         There was no evidence of residual polyp tissue.                        - The distal rectum and anal verge are normal on                         retroflexion view.                        - The examination was otherwise normal. Recommendation:        - Discharge patient to home (with escort).                        - Resume previous diet today.                        - Continue present medications.                        - Await pathology results.                        - Repeat colonoscopy in 5 years for surveillance. Procedure Code(s):     --- Professional ---                         [phone], Colonoscopy, flexible; with biopsy, single or                         multiple Diagnosis Code(s):     --- Professional ---                        Z86.010, Personal history of colonic polyps                        D12.3, Benign neoplasm of transverse colon (hepatic                         flexure or splenic flexure) CPT copyright 2022 American Medical Association. All rights reserved. The codes documented in this report are preliminary and upon coder review may  be revised to meet current compliance requirements. Dr. Ulyess Mort Lin Landsman MD, MD 11/04/2022 9:44:04 AM This report has been signed electronically. Number of Addenda: 0 Note Initiated On: 11/04/2022 9:10 AM Scope Withdrawal Time: 0 hours 8 minutes 32 seconds  Total Procedure Duration: 0 hours 12 minutes 41 seconds  Estimated Blood Loss:  Estimated blood loss: none.      [facility]

## 2022-11-04 NOTE — Anesthesia Postprocedure Evaluation (Signed)
Anesthesia Post Note  Patient: [name]  Procedure(s) Performed: COLONOSCOPY WITH PROPOFOL  Patient location during evaluation: Endoscopy Anesthesia Type: General Level of consciousness: awake and alert Pain management: pain level controlled Vital Signs Assessment: post-procedure vital signs reviewed and stable Respiratory status: spontaneous breathing, nonlabored ventilation, respiratory function stable and patient connected to nasal cannula oxygen Cardiovascular status: blood pressure returned to baseline and stable Postop Assessment: no apparent nausea or vomiting Anesthetic complications: no   No notable events documented.   Last Vitals:  Vitals:   11/04/22 0955 11/04/22 1005  BP: 110/61 126/63  Pulse: 74 (!) 138  Resp: 17 13  Temp:    SpO2: 100% 96%    Last Pain:  Vitals:   11/04/22 1005  TempSrc:   PainSc: 0-No pain                 Precious Haws [name]

## 2022-11-04 NOTE — H&P (Signed)
Cephas Darby, MD [address]  [place]  [place], [place] [address]  Main: [phone]  Fax: [phone] Pager: [phone]  Primary Care Physician:  Tonia Ghent, MD Primary Gastroenterologist:  Dr. Cephas Darby  Pre-Procedure History & Physical: HPI:  [name] is a 77 y.o. female is here for an colonoscopy.   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  Could be due to hernia or old scar tissue.  prev with CT done w/o alarming findings.  Will treat episodically.  Use tylenol #3 prn.     Hyperlipidemia    Lipoma of thigh    Left   Normal cardiac stress test 2014   PONV (postoperative nausea and vomiting)    nausea after hysterectomy, and 2 days after lung surgery 10/2015   Thyroid cancer ([facility]) 12/2015   Thyroid nodule     Past Surgical History:  Procedure Laterality Date   ABDOMINAL HYSTERECTOMY     BREAST EXCISIONAL BIOPSY Right 1977   BREAST MASS EXCISION  1977   benign   CARPAL TUNNEL RELEASE Right 1978   CATARACT EXTRACTION, BILATERAL Bilateral    CHOLECYSTECTOMY  11/23/2016   Procedure: LAPAROSCOPIC CHOLECYSTECTOMY;  Surgeon: Clayburn Pert, MD;  Location: [facility];  Service: General;;   COLONOSCOPY  2009   COLONOSCOPY WITH PROPOFOL N/A 05/18/2019   Procedure: COLONOSCOPY WITH PROPOFOL;  Surgeon: Virgel Manifold, MD;  Location: [facility];  Service: Endoscopy;  Laterality: N/A;   COLONOSCOPY WITH PROPOFOL N/A 08/15/2019   Procedure: COLONOSCOPY WITH PROPOFOL;  Surgeon: Virgel Manifold, MD;  Location: [facility];  Service: Endoscopy;  Laterality: N/A;   DILATION AND CURETTAGE OF UTERUS  2001   ELECTROMAGNETIC NAVIGATION BROCHOSCOPY N/A 08/25/2015   Procedure: ELECTROMAGNETIC NAVIGATION BRONCHOSCOPY;  Surgeon: Flora Lipps, MD;  Location: [facility];   Service: Cardiopulmonary;  Laterality: N/A;   ENDOBRONCHIAL ULTRASOUND N/A 08/25/2015   Procedure: ENDOBRONCHIAL ULTRASOUND;  Surgeon: Flora Lipps, MD;  Location: [facility];  Service: Cardiopulmonary;  Laterality: N/A;   ESOPHAGOGASTRODUODENOSCOPY  2002, 11/15/07   Normal (Dr. Allyn Kenner)   ESOPHAGOGASTRODUODENOSCOPY (EGD) WITH PROPOFOL N/A 05/18/2019   Procedure: ESOPHAGOGASTRODUODENOSCOPY (EGD) WITH PROPOFOL;  Surgeon: Virgel Manifold, MD;  Location: [facility];  Service: Endoscopy;  Laterality: N/A;   ESOPHAGOGASTRODUODENOSCOPY (EGD) WITH PROPOFOL N/A 08/15/2019   Procedure: ESOPHAGOGASTRODUODENOSCOPY (EGD) WITH PROPOFOL;  Surgeon: Virgel Manifold, MD;  Location: [facility];  Service: Endoscopy;  Laterality: N/A;   FACIAL COSMETIC SURGERY  01/13/2010   mini facelift   HERNIA REPAIR  1976   INSERTION OF MESH  07/06/2022   Procedure: INSERTION OF MESH;  Surgeon: Herbert Pun, MD;  Location: [facility];  Service: General;;   OOPHORECTOMY     PTOSIS REPAIR  12/08/2021   THORACOTOMY/LOBECTOMY Right 10/27/2015   Procedure: THORACOTOMY/LOBECTOMY;  Surgeon: Nestor Lewandowsky, MD;  Location: [facility];  Service: Thoracic;  Laterality: Right;   THYROIDECTOMY N/A 12/29/2015   Procedure: THYROIDECTOMY;  Surgeon: Beverly Gust, MD;  Location: [facility];  Service: ENT;  Laterality: N/A;   TOTAL VAGINAL HYSTERECTOMY  12/30/2008   for fibroid pain (Dr. Gertie Fey)   XI ROBOTIC ASSISTED SMALL BOWEL RESECTION N/A 06/28/2022   Procedure: XI ROBOTIC ASSISTED SMALL BOWEL RESECTION;  Surgeon: Herbert Pun, MD;  Location: [facility];  Service: General;  Laterality: N/A;  Prior to Admission medications   Medication Sig Start Date End Date Taking? Authorizing Provider  Biotin 1000 MCG tablet Take 1,000 mcg by mouth daily. Reported on 02/04/2016   Yes [provider]  levothyroxine (SYNTHROID) 75 MCG tablet TAKE 1 TABLET BY MOUTH DAILY BEFORE BREAKFAST. 07/25/22  Yes Cammie Sickle, MD  ondansetron (ZOFRAN-ODT) 4 MG disintegrating tablet Take 1 tablet (4 mg total) by mouth every 8 (eight) hours as needed for nausea or vomiting. 07/14/22  Yes Herbert Pun, MD  pantoprazole (PROTONIX) 40 MG tablet TAKE 1 TABLET BY MOUTH EVERY DAY 05/24/22  Yes Tonia Ghent, MD  Potassium Gluconate 595 MG CAPS Take 595 mg by mouth daily.    Yes [provider]  pravastatin (PRAVACHOL) 40 MG tablet TAKE 1 TABLET BY MOUTH EVERY DAY 03/29/22  Yes Tonia Ghent, MD  aspirin EC 81 MG tablet Take 1 tablet (81 mg total) by mouth daily. 11/29/19   Tonia Ghent, MD  Coenzyme Q10 200 MG capsule Take 100 mg by mouth daily.    [provider]  famotidine (PEPCID) 20 MG tablet Take 1 tablet (20 mg total) by mouth at bedtime. Patient not taking: Reported on 11/04/2022 08/25/22   Tonia Ghent, MD  Multiple Vitamins-Minerals (MULTIVITAMIN) tablet Take 1 tablet by mouth daily. 08/25/22   Tonia Ghent, MD    Allergies as of 09/14/2022 - Review Complete 09/13/2022  Allergen Reaction Noted   Atorvastatin  07/20/2010   Ciprofloxacin  08/14/2007   Epinephrine  04/26/2012   Metoprolol succinate Swelling 03/16/2011   Nortriptyline  04/15/2022   Simvastatin Other (See Comments) 03/16/2011   Sulfa antibiotics Other (See Comments) 08/14/2007    Family History  Problem Relation Age of Onset   Transient ischemic attack Mother    Hypertension Mother    Stroke Mother        mini strokes   Hypertension Sister    Cancer Sister        ovarian   Hypertension Sister    Hypertension Sister    Hypertension Sister    Breast cancer Neg Hx    Colon cancer Neg Hx     Social History   Socioeconomic History   Marital status: Married    Spouse name: Not on file   Number of children: Not on file   Years of education: Not on file   Highest education level: Not on file  Occupational History   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  He has short term memory loss.     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   Previously had an adopted son who died in 01-25-20.   Social Determinants of Health   Financial Resource Strain: Low Risk  (08/16/2022)   Overall Financial Resource Strain (CARDIA)    Difficulty of Paying Living Expenses: Not hard at all  Food Insecurity: No Food Insecurity (08/16/2022)   Hunger Vital Sign    Worried About Running Out of Food in the Last Year: Never  true    Ran Out of Food in the Last Year: Never true  Transportation Needs: No Transportation Needs (08/16/2022)   PRAPARE - Hydrologist (Medical): No    Lack of Transportation (Non-Medical): No  Physical Activity: Sufficiently Active (08/16/2022)   Exercise Vital Sign    Days of Exercise per Week: 5 days    Minutes of Exercise per Session: 30 min  Stress: No Stress Concern Present (08/16/2022)   [employer]    Feeling of Stress : Not at all  Social Connections: [place] (08/16/2022)   Social Connection and Isolation Panel [NHANES]    Frequency of Communication with Friends and Family: More than three times a week    Frequency of Social Gatherings with Friends and Family: More than three times a week    Attends Religious Services: More than 4 times per year    Active Member of [employer] or Organizations: Yes    Attends Music therapist: More than 4 times per year    Marital Status: Married  Human resources officer Violence: Not At Risk (08/16/2022)   Humiliation, Afraid, Rape, and Kick  questionnaire    Fear of Current or Ex-Partner: No    Emotionally Abused: No    Physically Abused: No    Sexually Abused: No    Review of Systems: See HPI, otherwise negative ROS  Physical Exam: BP (!) 162/82   Pulse 82   Temp (!) 97.1 F (36.2 C) (Temporal)   Resp 16   Ht 5\' 4"  (1.626 m)   Wt 56.7 kg   SpO2 99%   BMI 21.46 kg/m  General:   Alert,  pleasant and cooperative in NAD Head:  Normocephalic and atraumatic. Neck:  Supple; no masses or thyromegaly. Lungs:  Clear throughout to auscultation.    Heart:  Regular rate and rhythm. Abdomen:  Soft, nontender and nondistended. Normal bowel sounds, without guarding, and without rebound.   Neurologic:  Alert and  oriented x4;  grossly normal neurologically.  Impression/Plan: [name] is here for an colonoscopy to be performed for personal h/o colon adenoma  Risks, benefits, limitations, and alternatives regarding  colonoscopy have been reviewed with the patient.  Questions have been answered.  All parties agreeable.   Sherri Sear, MD  11/04/2022, 9:07 AM

## 2022-11-04 NOTE — Transfer of Care (Signed)
Immediate Anesthesia Transfer of Care Note  Patient: [name]  Procedure(s) Performed: COLONOSCOPY WITH PROPOFOL  Patient Location: PACU  Anesthesia Type:General  Level of Consciousness: drowsy  Airway & Oxygen Therapy: Patient Spontanous Breathing and Patient connected to nasal cannula oxygen  Post-op Assessment: Report given to RN and Post -op Vital signs reviewed and stable  Post vital signs: Reviewed and stable  Last Vitals:  Vitals Value Taken Time  BP 103/68   Temp 97.1   Pulse 75 11/04/22 0945  Resp 10 11/04/22 0945  SpO2 100 % 11/04/22 0945  Vitals shown include unvalidated device data.  Last Pain:  Vitals:   11/04/22 0848  TempSrc: Temporal  PainSc: 0-No pain         Complications: No notable events documented.

## 2022-11-04 NOTE — Anesthesia Preprocedure Evaluation (Signed)
Anesthesia Evaluation  Patient identified by MRN, date of birth, ID band Patient awake    Reviewed: Allergy & Precautions, NPO status , Patient's Chart, lab work & pertinent test results  History of Anesthesia Complications (+) PONV and history of anesthetic complications  Airway Mallampati: III  TM Distance: <3 FB Neck ROM: full    Dental  (+) Chipped, Implants   Pulmonary neg pulmonary ROS, neg shortness of breath   Pulmonary exam normal        Cardiovascular hypertension, (-) angina Normal cardiovascular exam     Neuro/Psych negative neurological ROS  negative psych ROS   GI/Hepatic Neg liver ROS,GERD  Controlled,,  Endo/Other  Hypothyroidism    Renal/GU negative Renal ROS  negative genitourinary   Musculoskeletal   Abdominal   Peds  Hematology negative hematology ROS (+)   Anesthesia Other Findings Past Medical History: 10/01/2015: Adenocarcinoma of right lung ([facility]) No date: GERD (gastroesophageal reflux disease) No date: GIB (gastrointestinal bleeding) No date: Groin pain     Comment:  Along superior/laterl R inguinal canal.  Could be due to              hernia or old scar tissue.  prev with CT done w/o               alarming findings.  Will treat episodically.  Use tylenol              #3 prn.   No date: Hyperlipidemia No date: Lipoma of thigh     Comment:  Left 2014: Normal cardiac stress test No date: PONV (postoperative nausea and vomiting)     Comment:  nausea after hysterectomy, and 2 days after lung surgery              10/2015 12/2015: Thyroid cancer ([place]) No date: Thyroid nodule  Past Surgical History: No date: ABDOMINAL HYSTERECTOMY 1977: BREAST EXCISIONAL BIOPSY; Right 1977: BREAST MASS EXCISION     Comment:  benign 1978: CARPAL TUNNEL RELEASE; Right No date: CATARACT EXTRACTION, BILATERAL; Bilateral 11/23/2016: CHOLECYSTECTOMY     Comment:  Procedure: LAPAROSCOPIC CHOLECYSTECTOMY;   Surgeon:               Clayburn Pert, MD;  Location: [facility];  Service:               General;; 2009: COLONOSCOPY 05/18/2019: COLONOSCOPY WITH PROPOFOL; N/A     Comment:  Procedure: COLONOSCOPY WITH PROPOFOL;  Surgeon:               Virgel Manifold, MD;  Location: [facility];                Service: Endoscopy;  Laterality: N/A; 08/15/2019: COLONOSCOPY WITH PROPOFOL; N/A     Comment:  Procedure: COLONOSCOPY WITH PROPOFOL;  Surgeon:               Virgel Manifold, MD;  Location: [facility];                Service: Endoscopy;  Laterality: N/A; 2001: [place] OF UTERUS 08/25/2015: ELECTROMAGNETIC NAVIGATION BROCHOSCOPY; N/A     Comment:  Procedure: ELECTROMAGNETIC NAVIGATION BRONCHOSCOPY;                Surgeon: Flora Lipps, MD;  Location: [facility];  Service:               Cardiopulmonary;  Laterality: N/A; 08/25/2015: ENDOBRONCHIAL ULTRASOUND; N/A     Comment:  Procedure: ENDOBRONCHIAL ULTRASOUND;  Surgeon: Flora Lipps, MD;  Location: [facility];  Service: Cardiopulmonary;              Laterality: N/A; 2002, 11/15/07: ESOPHAGOGASTRODUODENOSCOPY     Comment:  Normal (Dr. Allyn Kenner) 05/18/2019: ESOPHAGOGASTRODUODENOSCOPY (EGD) WITH PROPOFOL; N/A     Comment:  Procedure: ESOPHAGOGASTRODUODENOSCOPY (EGD) WITH               PROPOFOL;  Surgeon: Virgel Manifold, MD;  Location:               [facility];  Service: Endoscopy;  Laterality: N/A; 08/15/2019: ESOPHAGOGASTRODUODENOSCOPY (EGD) WITH PROPOFOL; N/A     Comment:  Procedure: ESOPHAGOGASTRODUODENOSCOPY (EGD) WITH               PROPOFOL;  Surgeon: Virgel Manifold, MD;  Location:               [facility];  Service: Endoscopy;  Laterality: N/A; 01/13/2010: FACIAL COSMETIC SURGERY     Comment:  mini facelift 1976: HERNIA REPAIR 07/06/2022: INSERTION OF MESH     Comment:  Procedure: INSERTION OF MESH;  Surgeon: Herbert Pun, MD;  Location: [facility];  Service:  General;; No date: OOPHORECTOMY 12/08/2021: PTOSIS REPAIR 10/27/2015: THORACOTOMY/LOBECTOMY; Right     Comment:  Procedure: THORACOTOMY/LOBECTOMY;  Surgeon: Nestor Lewandowsky, MD;  Location: [facility];  Service: Thoracic;                Laterality: Right; 12/29/2015: THYROIDECTOMY; N/A     Comment:  Procedure: THYROIDECTOMY;  Surgeon: Beverly Gust, MD;              Location: [facility];  Service: ENT;  Laterality: N/A; 12/30/2008: TOTAL VAGINAL HYSTERECTOMY     Comment:  for fibroid pain (Dr. Gertie Fey) 06/28/2022: XI ROBOTIC ASSISTED SMALL BOWEL RESECTION; N/A     Comment:  Procedure: XI ROBOTIC ASSISTED SMALL BOWEL RESECTION;                Surgeon: Herbert Pun, MD;  Location: [facility];               Service: General;  Laterality: N/A;  BMI    Body Mass Index: 21.46 kg/m      Reproductive/Obstetrics negative OB ROS                             Anesthesia Physical Anesthesia Plan  ASA: 3  Anesthesia Plan: General   Post-op Pain Management:    Induction: Intravenous  PONV Risk Score and Plan: Propofol infusion and TIVA  Airway Management Planned: Natural Airway and Nasal Cannula  Additional Equipment:   Intra-op Plan:   Post-operative Plan:   Informed Consent: I have reviewed the patients History and Physical, chart, labs and discussed the procedure including the risks, benefits and alternatives for the proposed anesthesia with the patient or authorized representative who has indicated his/her understanding and acceptance.     Dental Advisory Given  Plan Discussed with: Anesthesiologist, CRNA and Surgeon  Anesthesia Plan Comments: (Patient consented for risks of anesthesia including but not limited to:  - adverse reactions to medications - risk of airway placement if required - damage to eyes, teeth, lips or other oral mucosa - nerve  damage due to positioning  - sore throat or hoarseness - Damage to heart, brain,  nerves, lungs, other parts of body or loss of life  Patient voiced understanding.)       Anesthesia Quick Evaluation

## 2022-11-07 ENCOUNTER — Encounter: Payer: Self-pay | Admitting: Gastroenterology

## 2022-11-07 LAB — SURGICAL PATHOLOGY

## 2022-11-10 DIAGNOSIS — M2011 Hallux valgus (acquired), right foot: Secondary | ICD-10-CM | POA: Diagnosis not present

## 2022-11-10 DIAGNOSIS — M2041 Other hammer toe(s) (acquired), right foot: Secondary | ICD-10-CM | POA: Diagnosis not present

## 2022-11-16 ENCOUNTER — Inpatient Hospital Stay: Payer: PPO | Attending: Internal Medicine

## 2022-11-16 ENCOUNTER — Ambulatory Visit
Admission: RE | Admit: 2022-11-16 | Discharge: 2022-11-16 | Disposition: A | Discharge status: Home / Self Care | Payer: PPO | Source: Ambulatory Visit | Attending: Internal Medicine | Admitting: Internal Medicine

## 2022-11-16 DIAGNOSIS — Z85118 Personal history of other malignant neoplasm of bronchus and lung: Secondary | ICD-10-CM | POA: Diagnosis not present

## 2022-11-16 DIAGNOSIS — C3490 Malignant neoplasm of unspecified part of unspecified bronchus or lung: Secondary | ICD-10-CM | POA: Diagnosis not present

## 2022-11-16 DIAGNOSIS — Z85850 Personal history of malignant neoplasm of thyroid: Secondary | ICD-10-CM | POA: Insufficient documentation

## 2022-11-16 DIAGNOSIS — M8580 Other specified disorders of bone density and structure, unspecified site: Secondary | ICD-10-CM | POA: Insufficient documentation

## 2022-11-16 DIAGNOSIS — Z7982 Long term (current) use of aspirin: Secondary | ICD-10-CM | POA: Insufficient documentation

## 2022-11-16 DIAGNOSIS — C3431 Malignant neoplasm of lower lobe, right bronchus or lung: Secondary | ICD-10-CM

## 2022-11-16 DIAGNOSIS — Z79899 Other long term (current) drug therapy: Secondary | ICD-10-CM | POA: Diagnosis not present

## 2022-11-16 DIAGNOSIS — D696 Thrombocytopenia, unspecified: Secondary | ICD-10-CM | POA: Diagnosis not present

## 2022-11-16 DIAGNOSIS — R918 Other nonspecific abnormal finding of lung field: Secondary | ICD-10-CM | POA: Diagnosis not present

## 2022-11-16 LAB — CBC WITH DIFFERENTIAL/PLATELET
Abs Immature Granulocytes: 0.02 10*3/uL (ref 0.00–0.07)
Basophils Absolute: 0.1 10*3/uL (ref 0.0–0.1)
Basophils Relative: 1 %
Eosinophils Absolute: 0.1 10*3/uL (ref 0.0–0.5)
Eosinophils Relative: 2 %
HCT: 40.4 % (ref 36.0–46.0)
Hemoglobin: 13.2 g/dL (ref 12.0–15.0)
Immature Granulocytes: 0 %
Lymphocytes Relative: 27 %
Lymphs Abs: 1.6 10*3/uL (ref 0.7–4.0)
MCH: 31.2 pg (ref 26.0–34.0)
MCHC: 32.7 g/dL (ref 30.0–36.0)
MCV: 95.5 fL (ref 80.0–100.0)
Monocytes Absolute: 0.5 10*3/uL (ref 0.1–1.0)
Monocytes Relative: 8 %
Neutro Abs: 3.5 10*3/uL (ref 1.7–7.7)
Neutrophils Relative %: 62 %
Platelets: 147 10*3/uL — ABNORMAL LOW (ref 150–400)
RBC: 4.23 MIL/uL (ref 3.87–5.11)
RDW: 14.7 % (ref 11.5–15.5)
WBC: 5.7 10*3/uL (ref 4.0–10.5)
nRBC: 0 % (ref 0.0–0.2)

## 2022-11-16 LAB — COMPREHENSIVE METABOLIC PANEL
ALT: 22 U/L (ref 0–44)
AST: 24 U/L (ref 15–41)
Albumin: 4 g/dL (ref 3.5–5.0)
Alkaline Phosphatase: 48 U/L (ref 38–126)
Anion gap: 6 (ref 5–15)
BUN: 16 mg/dL (ref 8–23)
CO2: 30 mmol/L (ref 22–32)
Calcium: 9.1 mg/dL (ref 8.9–10.3)
Chloride: 105 mmol/L (ref 98–111)
Creatinine, Ser: 0.74 mg/dL (ref 0.44–1.00)
GFR, Estimated: >60 mL/min (ref >60)
Glucose, Bld: 93 mg/dL (ref 70–99)
Potassium: 4.5 mmol/L (ref 3.5–5.1)
Sodium: 141 mmol/L (ref 135–145)
Total Bilirubin: 0.9 mg/dL (ref 0.3–1.2)
Total Protein: 6.7 g/dL (ref 6.5–8.1)

## 2022-11-16 MED ORDER — IOHEXOL 300 MG/ML  SOLN
60.0000 mL | Freq: Once | INTRAMUSCULAR | Status: AC | PRN
  Administered 2022-11-16: 60 mL via INTRAVENOUS

## 2022-11-17 LAB — THYROID PANEL WITH TSH
Free Thyroxine Index: 2.2 (ref 1.2–4.9)
T3 Uptake Ratio: 28 % (ref 24–39)
T4, Total: 7.7 ug/dL (ref 4.5–12.0)
TSH: 8.48 u[IU]/mL — ABNORMAL HIGH (ref 0.450–4.500)

## 2022-11-23 ENCOUNTER — Encounter: Payer: Self-pay | Admitting: Internal Medicine

## 2022-11-23 ENCOUNTER — Inpatient Hospital Stay (HOSPITAL_BASED_OUTPATIENT_CLINIC_OR_DEPARTMENT_OTHER): Payer: PPO | Admitting: Internal Medicine

## 2022-11-23 VITALS — BP 146/82 | HR 83 | Temp 97.1°F | Resp 18 | Wt 127.0 lb

## 2022-11-23 DIAGNOSIS — Z85850 Personal history of malignant neoplasm of thyroid: Secondary | ICD-10-CM | POA: Diagnosis not present

## 2022-11-23 DIAGNOSIS — C3431 Malignant neoplasm of lower lobe, right bronchus or lung: Secondary | ICD-10-CM | POA: Diagnosis not present

## 2022-11-23 NOTE — Progress Notes (Signed)
Patient is concerned with recent hair loss.  Also having daily frontal headaches.

## 2022-11-23 NOTE — Progress Notes (Signed)
[place] OFFICE PROGRESS NOTE  Patient Care Team: Tonia Ghent, MD as PCP - General (Family Medicine) Kate Sable, MD as PCP - Cardiology (Cardiology) Beverly Gust, MD as Consulting Physician (Otolaryngology) Nestor Lewandowsky, MD (Inactive) as Consulting Physician (Cardiothoracic Surgery) Cammie Sickle, MD as Consulting Physician (Internal Medicine) Birder Robson, MD as Consulting Physician (Ophthalmology) Clayburn Pert, MD as Consulting Physician (General Surgery) Charlton Haws, [facility] as Pharmacist (Pharmacist)   Cancer Staging  Thyroid cancer [facility]) Staging form: Thyroid - Papillary or Follicular (Under 45 years), AJCC 7th Edition - Clinical: Stage I (T1, N0, M0) - Signed by Forest Gleason, MD on 09/11/2015 Laterality: Right    Oncology History Overview Note  # NOV 2016- RLL [place] ca T1N0 [incidental; Dr.Simonds; Bronc- s/p RLlobectomy; Dr.Oaks;Dec 2016]  # FEB 2017- 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 STAGE I; s/p Total Thyroidetcomy [Dr.Chapman];NO RAIU.  LOW RISK; but detectable thyroglobulin- on Synthroid  #Survivorship-pending  DIAGNOSIS: Lung cancer -stage I #thyroid cancer low risk  GOALS: Cure  CURRENT/MOST RECENT THERAPY: Surveillance    Thyroid cancer ([place])  Cancer of lower lobe of right lung ([place])  05/06/2016 Initial Diagnosis   Malignant neoplasm of lower lobe, right bronchus or lung ([place])    INTERVAL HISTORY: Accompanied by her husband.  Ambulating independently.   [name] 77 y.o.  female pleasant patient above history of stage I lung cancer and also stage I thyroid cancer on synthroid currently on surveillance is here for follow-up/review results of the CT scan.   In the interim pt was admitted to hospital for bowel obstruction/ and hernia surgery in Aug 2023. Pt has recovered well.   Patient denies any fever or chills or cough.   Denies any nausea vomiting abdominal pain.  Denies any headaches.  Complains of chronic back pain not any worse.  Review of Systems  Constitutional:  Positive for malaise/fatigue. Negative for chills, diaphoresis, fever and weight loss.  HENT:  Negative for nosebleeds and sore throat.   Eyes:  Negative for double vision.  Respiratory:  Negative for cough, hemoptysis, sputum production, shortness of breath and wheezing.   Cardiovascular:  Negative for chest pain, palpitations, orthopnea and leg swelling.  Gastrointestinal:  Negative for abdominal pain, blood in stool, constipation, diarrhea, heartburn, melena and vomiting.  Genitourinary:  Negative for dysuria, frequency and urgency.  Musculoskeletal:  Positive for back pain. Negative for joint pain.  Skin: Negative.  Negative for itching and rash.  Neurological:  Positive for tingling. Negative for dizziness, focal weakness, weakness and headaches.  Endo/Heme/Allergies:  Does not bruise/bleed easily.  Psychiatric/Behavioral:  Negative for depression. The patient is not nervous/anxious and does not have insomnia.       PAST MEDICAL HISTORY :  Past Medical History:  Diagnosis Date   Adenocarcinoma of right lung ([place]) 10/01/2015   GERD (gastroesophageal reflux disease)    GIB (gastrointestinal bleeding)    Groin pain    Along superior/laterl R inguinal canal.  Could be due to hernia or old scar tissue.  prev with CT done w/o alarming findings.  Will treat episodically.  Use tylenol #3 prn.     Hyperlipidemia    Lipoma of thigh    Left   Normal cardiac stress test 2014   PONV (postoperative nausea and vomiting)    nausea after hysterectomy, and 2 days after lung surgery 10/2015   Thyroid cancer ([place]) 12/2015   Thyroid nodule  PAST SURGICAL HISTORY :   Past Surgical History:  Procedure Laterality Date   ABDOMINAL HYSTERECTOMY     BREAST EXCISIONAL BIOPSY Right 1977   BREAST MASS EXCISION  1977   benign   CARPAL TUNNEL RELEASE  Right 1978   CATARACT EXTRACTION, BILATERAL Bilateral    CHOLECYSTECTOMY  11/23/2016   Procedure: LAPAROSCOPIC CHOLECYSTECTOMY;  Surgeon: Clayburn Pert, MD;  Location: [facility] ORS;  Service: General;;   COLONOSCOPY  2009   COLONOSCOPY WITH PROPOFOL N/A 05/18/2019   Procedure: COLONOSCOPY WITH PROPOFOL;  Surgeon: Virgel Manifold, MD;  Location: [facility] END[facility]COPY;  Service: Endoscopy;  Laterality: N/A;   COLONOSCOPY WITH PROPOFOL N/A 08/15/2019   Procedure: COLONOSCOPY WITH PROPOFOL;  Surgeon: Virgel Manifold, MD;  Location: [facility] END[facility];  Service: Endoscopy;  Laterality: N/A;   COLONOSCOPY WITH PROPOFOL N/A 11/04/2022   Procedure: COLONOSCOPY WITH PROPOFOL;  Surgeon: Lin Landsman, MD;  Location: [facility] END[facility]COPY;  Service: Gastroenterology;  Laterality: N/A;   DILATION AND CURETTAGE OF UTERUS  2001   ELECTROMAGNETIC NAVIGATION BROCHOSCOPY N/A 08/25/2015   Procedure: ELECTROMAGNETIC NAVIGATION BRONCHOSCOPY;  Surgeon: Flora Lipps, MD;  Location: [facility];  Service: Cardiopulmonary;  Laterality: N/A;   ENDOBRONCHIAL ULTRASOUND N/A 08/25/2015   Procedure: ENDOBRONCHIAL ULTRASOUND;  Surgeon: Flora Lipps, MD;  Location: [facility];  Service: Cardiopulmonary;  Laterality: N/A;   ESOPHAGOGASTRODUODENOSCOPY  2002, 11/15/07   Normal (Dr. Allyn Kenner)   ESOPHAGOGASTRODUODENOSCOPY (EGD) WITH PROPOFOL N/A 05/18/2019   Procedure: ESOPHAGOGASTRODUODENOSCOPY (EGD) WITH PROPOFOL;  Surgeon: Virgel Manifold, MD;  Location: [facility];  Service: Endoscopy;  Laterality: N/A;   ESOPHAGOGASTRODUODENOSCOPY (EGD) WITH PROPOFOL N/A 08/15/2019   Procedure: ESOPHAGOGASTRODUODENOSCOPY (EGD) WITH PROPOFOL;  Surgeon: Virgel Manifold, MD;  Location: [facility] END[facility];  Service: Endoscopy;  Laterality: N/A;   FACIAL COSMETIC SURGERY  01/13/2010   mini facelift   HERNIA REPAIR  1976   INSERTION OF MESH  07/06/2022   Procedure: INSERTION OF MESH;  Surgeon: Herbert Pun, MD;  Location: [facility];  Service:  General;;   OOPHORECTOMY     PTOSIS REPAIR  12/08/2021   THORACOTOMY/LOBECTOMY Right 10/27/2015   Procedure: THORACOTOMY/LOBECTOMY;  Surgeon: Nestor Lewandowsky, MD;  Location: [facility];  Service: Thoracic;  Laterality: Right;   THYROIDECTOMY N/A 12/29/2015   Procedure: THYROIDECTOMY;  Surgeon: Beverly Gust, MD;  Location: [facility];  Service: ENT;  Laterality: N/A;   TOTAL VAGINAL HYSTERECTOMY  12/30/2008   for fibroid pain (Dr. Gertie Fey)   XI ROBOTIC ASSISTED SMALL BOWEL RESECTION N/A 06/28/2022   Procedure: XI ROBOTIC [place];  Surgeon: Herbert Pun, MD;  Location: [facility];  Service: General;  Laterality: N/A;    FAMILY HISTORY :   Family History  Problem Relation Age of Onset   Transient ischemic attack Mother    Hypertension Mother    Stroke Mother        mini strokes   Hypertension Sister    Cancer Sister        ovarian   Hypertension Sister    Hypertension Sister    Hypertension Sister    Breast cancer Neg Hx    Colon cancer Neg Hx     SOCIAL HISTORY:   Social History   Tobacco Use   Smoking status: Never   Smokeless tobacco: Never  Vaping Use   Vaping Use: Never used  Substance Use Topics   Alcohol use: No    Alcohol/week: 0.0 standard drinks of alcohol   Drug use: No    ALLERGIES:  is allergic  to atorvastatin, ciprofloxacin, epinephrine, metoprolol succinate, nortriptyline, simvastatin, and sulfa antibiotics.  MEDICATIONS:  Current Outpatient Medications  Medication Sig Dispense Refill   aspirin EC 81 MG tablet Take 1 tablet (81 mg total) by mouth daily.     Biotin 1000 MCG tablet Take 1,000 mcg by mouth daily. Reported on 02/04/2016     Coenzyme Q10 200 MG capsule Take 100 mg by mouth daily.     levothyroxine (SYNTHROID) 75 MCG tablet TAKE 1 TABLET BY MOUTH DAILY BEFORE BREAKFAST. 90 tablet 1   Multiple Vitamins-Minerals (MULTIVITAMIN) tablet Take 1 tablet by mouth daily.     multivitamin-lutein (OCUVITE-LUTEIN) CAPS capsule Take  1 capsule by mouth daily.     pantoprazole (PROTONIX) 40 MG tablet TAKE 1 TABLET BY MOUTH EVERY DAY 90 tablet 1   Potassium Gluconate 595 MG CAPS Take 595 mg by mouth daily.      pravastatin (PRAVACHOL) 40 MG tablet TAKE 1 TABLET BY MOUTH EVERY DAY 90 tablet 3   ondansetron (ZOFRAN-ODT) 4 MG disintegrating tablet Take 1 tablet (4 mg total) by mouth every 8 (eight) hours as needed for nausea or vomiting. (Patient not taking: Reported on 11/23/2022) 10 tablet 0   No current facility-administered medications for this visit.    PHYSICAL EXAMINATION: ECOG PERFORMANCE STATUS: 1 - Symptomatic but completely ambulatory  BP (!) 146/82 (BP Location: Left Arm, Patient Position: Sitting)   Pulse 83   Temp (!) 97.1 F (36.2 C) (Tympanic)   Resp 18   Wt 127 lb (57.6 kg)   BMI 21.80 kg/m   Filed Weights   11/23/22 1300  Weight: 127 lb (57.6 kg)       Physical Exam HENT:     Head: Normocephalic and atraumatic.     Mouth/Throat:     Pharynx: No oropharyngeal exudate.  Eyes:     Pupils: Pupils are equal, round, and reactive to light.  Cardiovascular:     Rate and Rhythm: Normal rate and regular rhythm.  Pulmonary:     Effort: No respiratory distress.     Breath sounds: No wheezing.  Abdominal:     General: Bowel sounds are normal. There is no distension.     Palpations: Abdomen is soft. There is no mass.     Tenderness: There is no abdominal tenderness. There is no guarding or rebound.  Musculoskeletal:        General: No tenderness. Normal range of motion.     Cervical back: Normal range of motion and neck supple.  Skin:    General: Skin is warm.  Neurological:     Mental Status: She is alert and oriented to person, place, and time.  Psychiatric:        Mood and Affect: Affect normal.    LABORATORY DATA:  I have reviewed the data as listed    Component Value Date/Time   NA 141 11/16/2022 1031   NA 141 07/01/2013 0103   K 4.5 11/16/2022 1031   K 3.8 07/01/2013 0103   CL  105 11/16/2022 1031   CL 110 (H) 07/01/2013 0103   CO2 30 11/16/2022 1031   CO2 27 07/01/2013 0103   GLUCOSE 93 11/16/2022 1031   GLUCOSE 88 07/01/2013 0103   BUN 16 11/16/2022 1031   BUN 11 07/01/2013 0103   CREATININE 0.74 11/16/2022 1031   CREATININE 0.68 07/01/2013 0103   CALCIUM 9.1 11/16/2022 1031   CALCIUM 8.2 (L) 07/01/2013 0103   PROT 6.7 11/16/2022 1031   PROT 5.5 (L)  07/01/2013 0103   ALBUMIN 4.0 11/16/2022 1031   ALBUMIN 3.0 (L) 07/01/2013 0103   AST 24 11/16/2022 1031   AST 19 07/01/2013 0103   ALT 22 11/16/2022 1031   ALT 24 07/01/2013 0103   ALKPHOS 48 11/16/2022 1031   ALKPHOS 55 07/01/2013 0103   BILITOT 0.9 11/16/2022 1031   BILITOT 0.7 07/01/2013 0103   GFRNONAA >60 11/16/2022 1031   GFRNONAA >60 07/01/2013 0103   GFRAA >60 05/19/2019 0117   GFRAA >60 07/01/2013 0103    No results found for: "SPEP", "UPEP"  Lab Results  Component Value Date   WBC 5.7 11/16/2022   NEUTROABS 3.5 11/16/2022   HGB 13.2 11/16/2022   HCT 40.4 11/16/2022   MCV 95.5 11/16/2022   PLT 147 (L) 11/16/2022      Chemistry      Component Value Date/Time   NA 141 11/16/2022 1031   NA 141 07/01/2013 0103   K 4.5 11/16/2022 1031   K 3.8 07/01/2013 0103   CL 105 11/16/2022 1031   CL 110 (H) 07/01/2013 0103   CO2 30 11/16/2022 1031   CO2 27 07/01/2013 0103   BUN 16 11/16/2022 1031   BUN 11 07/01/2013 0103   CREATININE 0.74 11/16/2022 1031   CREATININE 0.68 07/01/2013 0103      Component Value Date/Time   CALCIUM 9.1 11/16/2022 1031   CALCIUM 8.2 (L) 07/01/2013 0103   ALKPHOS 48 11/16/2022 1031   ALKPHOS 55 07/01/2013 0103   AST 24 11/16/2022 1031   AST 19 07/01/2013 0103   ALT 22 11/16/2022 1031   ALT 24 07/01/2013 0103   BILITOT 0.9 11/16/2022 1031   BILITOT 0.7 07/01/2013 0103       RADIOGRAPHIC STUDIES: I have personally reviewed the radiological images as listed and agreed with the findings in the report. No results found.   ASSESSMENT & PLAN:  Cancer  of lower lobe of right lung ([place]) # RLL Lung ca; Stage I;  no adjuvant therapy.  JAN 17th, 2024- Stable.    # RUL-groundglass opacity JUNE 2023- CT scan: RUL  Progressive enlargement of a ground-glass density right upper lobe nodule over the last 2.5 years, suspicious for low-grade adenocarcinoma.  PET SCAN JULY 15th, 2023-  Ground-glass nodules of the right upper lobe demonstrates no significant FDG uptake. However JAN 2024- CT chest- 15 x 13 mm ground-glass opacity in the peripheral right upper lobe measured previously is 11 x 11 mm today.  Continue surveillance with imaging in 6 months.   # JAN 17th, 2024- CT Interval development of new clustered nodularity in the left lower lobe with dominant nodule in this region measuring 6 mm. Imaging features are likely infectious/inflammatory.  Patient is asymptomatic.  Will order follow-up CT chest in 6 months.   # Thyroid cancer [FEB 2017] status post thyroidectomy low risk stage I; on Synthroid 75 mcg. FEB 2023-thyroid ultrasound no evidence of recurrence. JAN 2024- TSH- 8; but T4-WNL [ asymptomatic]; at 88 mcg-had fatigue palpitations- continue current dose.  Awaiting thyroglobulin levels.  # Jan 2021-  BMD- Osteopenia- T score=- 2.0; improved from 4 years ago; ca+vit D BID-stable.   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  Monitor for now    # DISPOSITION: # follow up in 6 months-MD;]cbc/cmp/ thyroid profile/ Thyroglobulin/and thyroglobulin antibodies-2 week prior];CT chest prior- - Dr.B   # I reviewed the blood work- with the patient in detail; also reviewed the imaging independently [as summarized above]; and with the  patient in detail.      Orders Placed This Encounter  Procedures   CT CHEST W CONTRAST    Standing Status:   Future    Standing Expiration Date:   11/24/2023    Order Specific Question:   If indicated for the ordered procedure, I authorize the administration of contrast media per Radiology  protocol    Answer:   Yes    Order Specific Question:   Preferred imaging location?    Answer:   [facility]   CBC with Differential/Platelet    Standing Status:   Future    Standing Expiration Date:   11/24/2023   Comprehensive metabolic panel    Standing Status:   Future    Standing Expiration Date:   11/24/2023   Thyroid Panel With TSH    Standing Status:   Future    Standing Expiration Date:   11/24/2023   Thyroglobulin antibody    Standing Status:   Future    Standing Expiration Date:   11/24/2023   TgAb+Thyroglobulin IMA or RIA    Standing Status:   Future    Standing Expiration Date:   11/24/2023   All questions were answered. The patient knows to call the clinic with any problems, questions or concerns.      Cammie Sickle, MD 11/23/2022 2:26 PM

## 2022-11-23 NOTE — Assessment & Plan Note (Addendum)
#  RLL Lung ca; Stage I;  no adjuvant therapy.  JAN 17th, 2024- Stable.    # RUL-groundglass opacity JUNE 2023- CT scan: RUL  Progressive enlargement of a ground-glass density right upper lobe nodule over the last 2.5 years, suspicious for low-grade adenocarcinoma.  PET SCAN JULY 15th, 2023-  Ground-glass nodules of the right upper lobe demonstrates no significant FDG uptake. However JAN 2024- CT chest- 15 x 13 mm ground-glass opacity in the peripheral right upper lobe measured previously is 11 x 11 mm today.  Continue surveillance with imaging in 6 months.   # JAN 17th, 2024- CT Interval development of new clustered nodularity in the left lower lobe with dominant nodule in this region measuring 6 mm. Imaging features are likely infectious/inflammatory.  Patient is asymptomatic.  Will order follow-up CT chest in 6 months.   # Thyroid cancer [FEB 2017] status post thyroidectomy low risk stage I; on Synthroid 75 mcg. FEB 2023-thyroid ultrasound no evidence of recurrence. JAN 2024- TSH- 8; but T4-WNL [ asymptomatic]; at 88 mcg-had fatigue palpitations- continue current dose.  Awaiting thyroglobulin levels.  # Jan 2021-  BMD- Osteopenia- T score=- 2.0; improved from 4 years ago; ca+vit D BID-stable.   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  Monitor for now    # DISPOSITION: # follow up in 6 months-MD;]cbc/cmp/ thyroid profile/ Thyroglobulin/and thyroglobulin antibodies-2 week prior];CT chest prior- - Dr.B   # I reviewed the blood work- with the patient in detail; also reviewed the imaging independently [as summarized above]; and with the patient in detail.

## 2022-11-24 LAB — TGAB+THYROGLOBULIN IMA OR RIA: Thyroglobulin Antibody: 14.8 IU/mL — ABNORMAL HIGH (ref 0.0–0.9)

## 2022-11-24 LAB — THYROGLOBULIN BY RIA: Thyroglobulin by RIA: 2.2 ng/mL

## 2022-12-07 ENCOUNTER — Telehealth: Payer: Self-pay

## 2022-12-07 NOTE — Progress Notes (Signed)
Care Management & Coordination Services Pharmacy Team  Reason for Encounter: Appointment Reminder  Contacted patient to confirm telephone appointment with Al Corpus , PharmD on 12/12/22 at 3:00. Spoke with patient on 12/07/2022   Do you have any problems getting your medications? No  What is your top health concern you would like to discuss at your upcoming visit?  Couseled patient on purpose of visit with   Have you seen any other providers since your last visit with [facility]? [facility]:  [facility]13/22- [place] [place]- abdominal pain- CT scan no admission 07/06/22 thru 07/14/22- [facility]- hernia with obstruction-surgery 06/28/22 thru 07/01/22- [facility]- intissusception intestine-surgery 06/26/22-[place] - abdominal pain -UA,CT scan, discharged  Star Rating Drugs:  Medication:  Last Fill: Day Supply Pravastatin 40mg  11/03/22  90  Care Gaps: Annual wellness visit in last year? Yes    Al Corpus, PharmD notified  Burt Knack, [facility] Clinical Pharmacy Assistant [phone]

## 2022-12-12 ENCOUNTER — Ambulatory Visit: Payer: PPO | Admitting: Pharmacist

## 2022-12-12 NOTE — Progress Notes (Signed)
Care Management & Coordination Services Pharmacy Note  12/12/2022 Name:  [name] MRN:  [id number] DOB:  Jan 08, 1946  Summary: F/U visit -HLD: LDL 77 (07/2022) adequate control of highest tolerated statin -Osteopenia: pt is not taking calcium/Vitamin D; discussed role of calcium/vitamin d in maintaining bone density; pt is due for repeat DEXA scan this year -GI: pt reports regular bowel movements, eating 2 prunes per day   Recommendations/Changes made from today's visit: -Advised to schedule DEXA/mammogram -Advised to restart Vitamin D 1000 IU daily; goal calcium 1000-1200 mg daily from diet  Follow up plan: -Health Concierge will call patient 6 months for general update -Pharmacist follow up PRN -PCP appt 12/26/22; oncology appt 05/31/23    Subjective: [name] is an 77 y.o. year old female who is a primary patient of Damita Dunnings, Elveria Rising, MD.  The care coordination team was consulted for assistance with disease management and care coordination needs.    Engaged with patient by telephone for follow up visit.  Recent office visits: 08/25/22 Dr Damita Dunnings OV: annual - repeat DEXA 2024 with mammogram.   07/20/22 Dr Damita Dunnings OV: hospital f/u - keep using miralax.  Recent consult visits: 11/23/22 Dr Rogue Bussing (Heme/Onc): f/u lung cancer. CT 10/2022 ground glass opacity, f/u scan 6 months.  05/30/22 PA Luella Cook (Neurology): olfactory hallucination. No longer taking nortriptyline. Try melatonin for sleep, consider CBT-I.  Hospital visits: 09/13/22 ED visit [facility]): periumbilical abdominal pain - CT reassuring.   07/06/22 - 07/14/22 [facility]): femoral hernia w/ obstruction. Underwent bowel resection and hernia repair. C/b postop bleeding requiring 2 units PRBC.    Objective:  Lab Results  Component Value Date   CREATININE 0.74 11/16/2022   BUN 16 11/16/2022   GFR 85.78 07/20/2022   GFRNONAA >60 11/16/2022   GFRAA >60 05/19/2019   NA 141 11/16/2022   K 4.5 11/16/2022    CALCIUM 9.1 11/16/2022   CO2 30 11/16/2022   GLUCOSE 93 11/16/2022    Lab Results  Component Value Date/Time   GFR 85.78 07/20/2022 01:14 PM   GFR 84.13 01/31/2022 10:28 AM   MICROALBUR 3.0 (H) 03/16/2011 12:05 PM   MICROALBUR 0.9 02/11/2010 08:34 AM    Last diabetic Eye exam: No results found for: "HMDIABEYEEXA"  Last diabetic Foot exam: No results found for: "HMDIABFOOTEX"   Lab Results  Component Value Date   CHOL 145 08/19/2022   HDL 40.60 08/19/2022   LDLCALC 77 08/19/2022   LDLDIRECT 83.8 03/11/2011   TRIG 137.0 08/19/2022   CHOLHDL 4 08/19/2022       Latest Ref Rng & Units 11/16/2022   10:31 AM 09/13/2022    8:58 AM 07/20/2022    1:14 PM  Hepatic Function  Total Protein 6.5 - 8.1 g/dL 6.7  6.9  6.2   Albumin 3.5 - 5.0 g/dL 4.0  4.0  3.8   AST 15 - 41 U/L 24  25  17    ALT 0 - 44 U/L 22  20  14    Alk Phosphatase 38 - 126 U/L 48  49  58   Total Bilirubin 0.3 - 1.2 mg/dL 0.9  0.8  1.2     Lab Results  Component Value Date/Time   TSH 8.480 (H) 11/16/2022 10:31 AM   TSH 0.215 (L) 04/20/2022 03:15 PM       Latest Ref Rng & Units 11/16/2022   10:31 AM 09/13/2022    8:58 AM 07/20/2022    1:14 PM  CBC  WBC 4.0 - 10.5 K/uL 5.7  6.0  8.0   Hemoglobin 12.0 - 15.0 g/dL 13.2  13.0  10.5   Hematocrit 36.0 - 46.0 % 40.4  40.2  31.8   Platelets 150 - 400 K/uL 147  160  227.0     Lab Results  Component Value Date/Time   VD25OH 32.65 08/19/2022 08:09 AM   VD25OH 40.06 01/31/2022 10:28 AM   VITAMINB12 >1504 (H) 01/31/2022 10:28 AM    Clinical ASCVD: Yes  - aortic atherosclerosis The 10-year ASCVD risk score (Arnett DK, et al., 2019) is: 26.5%   Values used to calculate the score:     Age: 50 years     Sex: Female     Is Non-Hispanic African American: No     Diabetic: No     Tobacco smoker: No     Systolic Blood Pressure: 629 mmHg     Is BP treated: No     HDL Cholesterol: 40.6 mg/dL     Total Cholesterol: 145 mg/dL        08/16/2022    2:32 PM 07/01/2021     2:16 PM 06/10/2020    2:00 PM  Depression screen PHQ 2/9  Decreased Interest 0 0 0  Down, Depressed, Hopeless 0 0 0  PHQ - 2 Score 0 0 0  Altered sleeping  2 0  Tired, decreased energy  1 0  Change in appetite  0 0  Feeling bad or failure about yourself   0 0  Trouble concentrating  0 0  Moving slowly or fidgety/restless  0 0  Suicidal thoughts  0 0  PHQ-9 Score  3 0  Difficult doing work/chores   Not difficult at all     Social History   Tobacco Use  Smoking Status Never  Smokeless Tobacco Never   BP Readings from Last 3 Encounters:  11/23/22 (!) 146/82  11/04/22 126/63  09/13/22 (!) 160/88   Pulse Readings from Last 3 Encounters:  11/23/22 83  11/04/22 (!) 138  09/13/22 80   Wt Readings from Last 3 Encounters:  11/23/22 127 lb (57.6 kg)  11/04/22 125 lb (56.7 kg)  09/13/22 125 lb (56.7 kg)   BMI Readings from Last 3 Encounters:  11/23/22 21.80 kg/m  11/04/22 21.46 kg/m  09/13/22 21.46 kg/m    Allergies  Allergen Reactions   Atorvastatin     REACTION: myalgias   Ciprofloxacin     REACTION: increase reflux   Epinephrine     Heart racing with dental injection   Metoprolol Succinate Swelling    Swelling and stiffness in hands and ankles   Nortriptyline     Intolerant but not allergy   Simvastatin Other (See Comments)    Muscle aches and stiffness   Sulfa Antibiotics Other (See Comments)    REACTION: itching    Medications Reviewed Today     Reviewed by Charlton Haws, [facility] (Pharmacist) on 12/12/22 at 1518  Med List Status: <None>   Medication Order Taking? Sig Documenting Provider Last Dose Status Informant  aspirin EC 81 MG tablet [id number] Yes Take 1 tablet (81 mg total) by mouth daily. Tonia Ghent, MD Taking Active   Biotin 1000 MCG tablet [id number] Yes Take 1,000 mcg by mouth daily. Reported on 02/04/2016 [provider] Taking Active Pharmacy Records           Med Note [place], AMBER G   Fri Aug 12, 2016 10:20 AM)     Coenzyme Q10 200 MG capsule [id number] Yes Take 100 mg  by mouth daily. [provider] Taking Active Pharmacy Records  levothyroxine (SYNTHROID) 75 MCG tablet [id number] Yes TAKE 1 TABLET BY MOUTH DAILY BEFORE BREAKFAST. Cammie Sickle, MD Taking Active   Multiple Vitamins-Minerals (MULTIVITAMIN) tablet [id number] Yes Take 1 tablet by mouth daily. Tonia Ghent, MD Taking Active   multivitamin-lutein [facility]) CAPS capsule [id number] Yes Take 1 capsule by mouth daily. [provider] Taking Active   ondansetron (ZOFRAN-ODT) 4 MG disintegrating tablet [id number] Yes Take 1 tablet (4 mg total) by mouth every 8 (eight) hours as needed for nausea or vomiting. Herbert Pun, MD Taking Active   pantoprazole (PROTONIX) 40 MG tablet [id number] Yes TAKE 1 TABLET BY MOUTH EVERY DAY Tonia Ghent, MD Taking Active   Potassium Gluconate 595 MG CAPS [id number] Yes Take 595 mg by mouth daily.  [provider] Taking Active Pharmacy Records  pravastatin (PRAVACHOL) 40 MG tablet [id number] Yes TAKE 1 TABLET BY MOUTH EVERY DAY Tonia Ghent, MD Taking Active             SDOH:  (Social Determinants of Health) assessments and interventions performed: No SDOH Interventions    Flowsheet Row Clinical Support from 08/16/2022 in [place] at [place] Telephone from 07/15/2022 in [place] from 12/13/2021 in [place] at [place] from 06/10/2020 in [place] at [place] Interventions Intervention Not Indicated -- Intervention Not Indicated --  Housing Interventions Intervention Not Indicated -- -- --  Transportation Interventions Intervention Not Indicated Intervention Not Indicated -- --  Utilities Interventions Intervention Not Indicated -- -- --  Alcohol Usage  Interventions Intervention Not Indicated (Score <7) -- -- --  Depression Interventions/Treatment  -- -- -- PHQ2-9 Score <4 Follow-up Not Indicated  Financial Strain Interventions Intervention Not Indicated Intervention Not Indicated Intervention Not Indicated --  Physical Activity Interventions Intervention Not Indicated -- -- --  Stress Interventions Intervention Not Indicated -- -- --  Social Connections Interventions Intervention Not Indicated -- -- --       Medication Assistance: None required.  Patient affirms current coverage meets needs.  Medication Access: Within the past 30 days, how often has patient missed a dose of medication? 0 Is a pillbox or other method used to improve adherence? No  Factors that may affect medication adherence? no barriers identified Are meds synced by current pharmacy? No  Are meds delivered by current pharmacy? No  Does patient experience delays in picking up medications due to transportation concerns? No   Upstream Services Reviewed: Is patient disadvantaged to use [facility]?: Yes  Current Rx insurance plan: HTA Name and location of Current pharmacy:  [id number] - [place], [place] - [address] [place] [address] Phone: [phone] Fax: [phone]  [facility] services reviewed with patient today?: No  Patient requests to transfer care to [facility] Pharm[facility]?: No  Reason patient declined to change pharmacies: Disadvantaged due to insurance/mail order  Compliance/Adherence/Medication fill history: Care Gaps: None  Star-Rating Drugs: Pravastatin - PDC 82% (LF 11/03/22 x 90 ds)    ASSESSMENT / PLAN   Hypertension (BP goal <140/90) -Not ideally controlled - BP was elevated in recent office visit (146/82); pt is not checking BP regularly at home -pt reports history of white coat syndrome -Current treatment: None -Medications previously tried: metoprolol, lisinopril  -Current home readings:  n/a -Educated  on BP goals and benefits of medications for prevention of heart attack, stroke and kidney damage; Importance of home blood pressure monitoring; -Counseled to monitor BP at home 1-2x weekly  Hyperlipidemia: (LDL goal < 70) -Controlled- LDL 77 (07/2022) adequate; pt has not tolerated other statins but does fine with pravastatin 40 mg -Hx aortic atherosclerosis -Current treatment: Pravastatin 40 mg daily PM - Appropriate, Query Effective, Safe, Accessible Coenzyme Q10 200 mg -Appropriate, Effective, Safe, Accessible Aspirin 81 mg daily -Appropriate, Effective, Safe, Accessible -Medications previously tried: simvastatin, atorvastatin -Educated on Cholesterol goals; Benefits of statin for ASCVD risk reduction; Importance of limiting foods high in cholesterol; -Recommended to continue current medication  Osteopenia (Goal prevent fractures) -Stable -Last DEXA Scan: 11/27/19  T-Score femoral neck: -1.5  T-Score lumbar spine: -2.0  10-year probability of major osteoporotic fracture: 15.5%  10-year probability of hip fracture: 3.1% -Patient is a candidate for pharmacologic treatment due to T-Score -1.0 to -2.5 and 10-year risk of hip fracture > 3%. Bisphosphonate tx deferred previously due to ongoing GERD. -Current treatment  Calcium-Vitamin D TIW -Appropriate, Effective, Safe, Accessible -Medications previously tried: none  -Recommend weight-bearing and muscle strengthening exercises for building and maintaining bone density. -Recommend repeat DEXA 2024  Hypothyroidism (Goal: maintain TSH in goal range) -Controlled - pt takes levothyroxine at 6am 30 min before other meds/food -Hx thyroid cancer s/p thyroidectomy 2017. Unable to tolerate doses higher than 88 mcg. -Current treatment  Levothyroxine 88 mcg daily -Appropriate, Effective, Safe, Accessible -Recommended to continue current medication  GERD (Goal: manage symptoms) -Controlled - pt takes omeprazole 30 min after  levothyroxine; she reports symptoms are mostly controlled, she has occasional breakthrough symptoms with certain foods -Current treatment  Omeprazole 40 mg daily AM -Appropriate, Effective, Safe, Accessible Tums PRN -Appropriate, Effective, Safe, Accessible -Recommended to continue current medication  Health Maintenance -Vaccine gaps: Covid booster, Shingrix -Hx lung cancer 2016, s/p lobectomy. No recurrence since. -Hx bowel resection/hernia repair 07/2022 -Eats 2 prunes every day for regular BM   Charlene Brooke, PharmD, [facility] Clinical Pharmacist [place] [facility] at [facility] [phone]

## 2022-12-12 NOTE — Patient Instructions (Signed)
Visit Information  Phone number for Pharmacist: [phone]  Thank you for meeting with me to discuss your medications! Below is a summary of what we talked about during the visit:   Recommendations/Changes made from today's visit: -Advised to schedule DEXA/mammogram -Advised to restart Vitamin D 1000 IU daily; goal calcium 1000-1200 mg daily from diet  Follow up plan: -Health Concierge will call patient 6 months for general update -Pharmacist follow up PRN -PCP appt 12/26/22; oncology appt 05/31/23   Charlene Brooke, PharmD, BCACP Clinical Pharmacist [place] [facility] at [facility] [phone]

## 2022-12-26 ENCOUNTER — Ambulatory Visit (INDEPENDENT_AMBULATORY_CARE_PROVIDER_SITE_OTHER): Payer: PPO | Admitting: Family Medicine

## 2022-12-26 ENCOUNTER — Encounter: Payer: Self-pay | Admitting: Family Medicine

## 2022-12-26 VITALS — BP 132/60 | HR 81 | Temp 98.5°F | Ht 64.0 in | Wt 126.0 lb

## 2022-12-26 DIAGNOSIS — B079 Viral wart, unspecified: Secondary | ICD-10-CM

## 2022-12-26 DIAGNOSIS — M549 Dorsalgia, unspecified: Secondary | ICD-10-CM

## 2022-12-26 NOTE — Progress Notes (Unsigned)
IMPRESSION: 1. Interval development of new clustered nodularity in the left lower lobe with dominant nodule in this region measuring 6 mm. Imaging features are likely infectious/inflammatory. Consider follow-up CT chest in 3 months after therapy to re-evaluate. 2. 15 x 13 mm ground-glass opacity in the peripheral right upper lobe measured previously is 11 x 11 mm today. 3. 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 This is probably a benign lesion such as hemangioma. Attention on follow-up recommended. 4. Similar scattered tiny solid and non solid pulmonary nodules, likely benign. 5.  Aortic Atherosclerosis (ICD10-I70.0).  Pain between shoulder blades.  CT- Musculoskeletal: No worrisome lytic or sclerotic osseous abnormality.  Intermittent, but persistent.  Not present now.  Noted after eating, not prior.  Not noted after every meal.  No clear trigger food.  S/p cholecystectomy.  No rash, no bruising.  No pain with exercising.  It happens not daily.  Using a heating pad helps.  No vomiting, no stool changes.  Still on PPI.    Colonoscopy 2024.    Saw podiatry in the meantime.  She is managing as is and deferred surgery.    Dicussed TSH with minimally elevated level, followed by outside clinic.    She and her husband celebrated her 8th anniversary.    Occ R shoulder pain driving with normal ROM and strength now.    Warts on R 2nd and 3rd finger.  D/w pt about freezing. Prev tx helped.    Meds, vitals, and allergies reviewed.   ROS: Per HPI unless specifically indicated in ROS section

## 2022-12-26 NOTE — Patient Instructions (Addendum)
Keep track of the back pain and see if you can identify trigger foods and see how often it happens.   Update me as needed.  Take care.  Glad to see you.

## 2022-12-28 NOTE — Assessment & Plan Note (Signed)
Frozen x 3 at each lesion without complication.  Routine instructions given to patient.  Tolerated well.  She will update me as needed.

## 2022-12-28 NOTE — Assessment & Plan Note (Signed)
I do not suspect an ominous source.  Recent imaging discussed with patient.  I asked her to keep track of the back pain and see if she can identify trigger foods and see how often it happens.  She agreed and she will let me know.

## 2023-01-18 ENCOUNTER — Encounter: Payer: Self-pay | Admitting: Family Medicine

## 2023-01-19 ENCOUNTER — Other Ambulatory Visit: Payer: Self-pay | Admitting: Internal Medicine

## 2023-01-22 ENCOUNTER — Other Ambulatory Visit: Payer: Self-pay | Admitting: Family Medicine

## 2023-01-22 DIAGNOSIS — E2839 Other primary ovarian failure: Secondary | ICD-10-CM

## 2023-01-23 ENCOUNTER — Other Ambulatory Visit: Payer: Self-pay | Admitting: Family Medicine

## 2023-01-23 DIAGNOSIS — Z1231 Encounter for screening mammogram for malignant neoplasm of breast: Secondary | ICD-10-CM

## 2023-02-09 ENCOUNTER — Encounter: Payer: Self-pay | Admitting: Family Medicine

## 2023-02-09 ENCOUNTER — Ambulatory Visit (INDEPENDENT_AMBULATORY_CARE_PROVIDER_SITE_OTHER)
Admission: RE | Admit: 2023-02-09 | Discharge: 2023-02-09 | Disposition: A | Discharge status: Home / Self Care | Payer: PPO | Source: Ambulatory Visit | Attending: Family Medicine | Admitting: Family Medicine

## 2023-02-09 ENCOUNTER — Ambulatory Visit (INDEPENDENT_AMBULATORY_CARE_PROVIDER_SITE_OTHER): Payer: PPO | Admitting: Family Medicine

## 2023-02-09 VITALS — BP 138/78 | HR 81 | Temp 97.7°F | Ht 64.0 in | Wt 125.0 lb

## 2023-02-09 DIAGNOSIS — K219 Gastro-esophageal reflux disease without esophagitis: Secondary | ICD-10-CM | POA: Diagnosis not present

## 2023-02-09 DIAGNOSIS — M549 Dorsalgia, unspecified: Secondary | ICD-10-CM | POA: Diagnosis not present

## 2023-02-09 DIAGNOSIS — M546 Pain in thoracic spine: Secondary | ICD-10-CM | POA: Diagnosis not present

## 2023-02-09 MED ORDER — SUCRALFATE 1 G PO TABS: 1.0000 g | ORAL_TABLET | Freq: Three times a day (TID) | ORAL | 0 refills | Status: DC

## 2023-02-09 NOTE — Patient Instructions (Addendum)
Go to the lab on the way out.   If you have mychart we'll likely use that to update you.    Take care.  Glad to see you. Stop pravastatin for 10 days and see if the pressure gets better. Either way, let me know.   Try sucralfate prior to meals. Let me know how that goes.

## 2023-02-09 NOTE — Progress Notes (Signed)
Pressure between the shoulder blades prev noted.  Now with midline midback pressure that radiates up the spine.  Also radiates around to the B thorax.  Some difficulty with swallowing occ.  Still taking PPI at baseline but still with some burning that improves with mylanta.  She can have a citric/acidic taste to burps.    Husband had TIA 01/01/23.  She has a lot going on.  Discussed.    Meds, vitals, and allergies reviewed.   ROS: Per HPI unless specifically indicated in ROS section   GEN: nad, alert and oriented HEENT: ncat NECK: supple w/o LA CV: rrr. PULM: ctab, no inc wob EXT: no edema SKIN: Well-perfused. Back nontender to palpation in the midline.

## 2023-02-12 NOTE — Assessment & Plan Note (Signed)
Unclear if this is statin related/exacerbated.  Would stop pravastatin for 10 days and see if the pressure gets better. Either way, she can let me know.   Check plain films.  See notes on imaging.

## 2023-02-12 NOTE — Assessment & Plan Note (Signed)
Discussed options.  Unclear if this is related to any of her thorax/back symptoms. Would try sucralfate prior to meals.  She can let me know how that goes.

## 2023-02-14 DIAGNOSIS — M2011 Hallux valgus (acquired), right foot: Secondary | ICD-10-CM | POA: Diagnosis not present

## 2023-02-14 DIAGNOSIS — M2041 Other hammer toe(s) (acquired), right foot: Secondary | ICD-10-CM | POA: Diagnosis not present

## 2023-02-23 ENCOUNTER — Telehealth: Payer: Self-pay | Admitting: Family Medicine

## 2023-02-23 NOTE — Telephone Encounter (Signed)
Pt states after ov on 4/11 with Para March, she was told to let him know how she was feeling. Pt states she was instructed to stop her cholesterol meds. Pt states she believes it helped a litte but not much. Pt stated she has been having less headaches but still has some back pressure. Pt is asking should she restart her cholesterol meds or remain off of them? Pt also asked could she start some meds that would help the back pressure? Call back # [phone]

## 2023-02-23 NOTE — Telephone Encounter (Signed)
I would restart pravastatin and see if she notes any changes with the restart.    I presume this is due to her back, ie a benign but annoying MSK source.  Before changing any other meds, see about her swallowing.  Is she having GI symptoms/trouble swallowing.  Is she still taking mylanta?  Please let me know about that.    Thanks.

## 2023-02-24 NOTE — Telephone Encounter (Signed)
Pt called asking for update on Duncan's response regarding her meds. Told pt duncan's response. Pt asked why anyone didn't contact her with Duncan's response before now? Pt states she only takes "mylanta" sometimes, when needed. Pt states she does have issues swallowing sveral time everyday, but not all the time when trying to eat or drink. Call back # [phone]

## 2023-02-24 NOTE — Addendum Note (Signed)
Addended by: Joaquim Nam on: 02/24/2023 11:32 AM   Modules accepted: Orders

## 2023-02-24 NOTE — Telephone Encounter (Signed)
Spoke with patient about below message from Dr. Para March. She is going to follow his recommendations and let us know how that goes. She will call back with an update next week.

## 2023-02-24 NOTE — Telephone Encounter (Signed)
We are responding to messages as quickly as we can.    I would restart pravastatin and see if she notes any changes with the restart.    I would keep taking pantoprazole, schedule mylanta daily for the next 3-4 days, and see if her symptoms are better with that.    If she continues to have trouble swallowing then we'll need to address that.  If the above plan doesn't help with her back sx, then at that point I would presume she has a MSK source and we could see about getting her CT from Dr. Donneta Romberg moved sooner.    I routed this to Dr. Donneta Romberg in the meantime, as FYI.  I thank all involved.

## 2023-03-06 ENCOUNTER — Ambulatory Visit
Admission: RE | Admit: 2023-03-06 | Discharge: 2023-03-06 | Disposition: A | Discharge status: Home / Self Care | Payer: PPO | Source: Ambulatory Visit | Attending: Family Medicine | Admitting: Family Medicine

## 2023-03-06 DIAGNOSIS — Z1231 Encounter for screening mammogram for malignant neoplasm of breast: Secondary | ICD-10-CM | POA: Insufficient documentation

## 2023-03-06 DIAGNOSIS — M8589 Other specified disorders of bone density and structure, multiple sites: Secondary | ICD-10-CM | POA: Diagnosis not present

## 2023-03-06 DIAGNOSIS — E2839 Other primary ovarian failure: Secondary | ICD-10-CM

## 2023-03-06 DIAGNOSIS — Z780 Asymptomatic menopausal state: Secondary | ICD-10-CM | POA: Diagnosis not present

## 2023-03-20 ENCOUNTER — Ambulatory Visit (INDEPENDENT_AMBULATORY_CARE_PROVIDER_SITE_OTHER): Payer: PPO | Admitting: Family Medicine

## 2023-03-20 ENCOUNTER — Encounter: Payer: Self-pay | Admitting: Family Medicine

## 2023-03-20 VITALS — BP 112/62 | HR 88 | Temp 97.3°F | Ht 64.0 in | Wt 125.0 lb

## 2023-03-20 DIAGNOSIS — R1310 Dysphagia, unspecified: Secondary | ICD-10-CM | POA: Diagnosis not present

## 2023-03-20 DIAGNOSIS — M8580 Other specified disorders of bone density and structure, unspecified site: Secondary | ICD-10-CM | POA: Diagnosis not present

## 2023-03-20 DIAGNOSIS — E7800 Pure hypercholesterolemia, unspecified: Secondary | ICD-10-CM

## 2023-03-20 MED ORDER — PRAVASTATIN SODIUM 40 MG PO TABS: 20.0000 mg | ORAL_TABLET | Freq: Every day | ORAL | Status: DC

## 2023-03-20 NOTE — Patient Instructions (Addendum)
Try breaking pravastatin in half and see if that helps the headaches.    Consider prolia but I'll check on GI eval re: dysphagia first- Dr. Allegra Lai.    Take care.  Glad to see you.

## 2023-03-20 NOTE — Progress Notes (Signed)
She had return of headaches with restart pravastatin.  Resolved off med. D/w pt about trial of 50% dose.  She can update me about her tolerance with that.  She still has pressure in her back.  Pressure along the bra line in the early AMs.  Swallowing is better taking mylanta, used prior to lunch and occ with extra dose prior to bedtime.  She had some occ nocturnal nausea prior to mylanta use that responded to zofran.  Nausea improved on mylanta.    We talked about her bone density results/fracture risk and osteopenia pathophysiology and treatment options.  We talked about jaw considerations and atypical long bone fractures with treatment.  We talked about overall fracture risk reduction and potential dysphagia related to bisphosphonates.  It would be reasonable to consider prolia but needs GI eval re: dysphagia first.   Has seen Dr. Allegra Lai.   Meds, vitals, and allergies reviewed.   ROS: Per HPI unless specifically indicated in ROS section   GEN: nad, alert and oriented HEENT: ncat NECK: supple w/o LA CV: rrr.   PULM: ctab, no inc wob ABD: soft, +bs EXT: no edema SKIN: Well-perfused

## 2023-03-22 DIAGNOSIS — R1310 Dysphagia, unspecified: Secondary | ICD-10-CM | POA: Insufficient documentation

## 2023-03-22 NOTE — Assessment & Plan Note (Signed)
Her swallowing improved by taking Mylanta before lunch and occasionally before bedtime.  She had nocturnal nausea that responded to Zofran.  Nausea improved with Mylanta use.  This is in spite of her already taking PPI and I need input from Dr. Allegra Lai.  I routed this note for help from GI.  I appreciate the help of all involved.

## 2023-03-22 NOTE — Assessment & Plan Note (Signed)
It would be reasonable to consider prolia but needs GI eval re: dysphagia first.   Has seen Dr. Allegra Lai.

## 2023-03-22 NOTE — Assessment & Plan Note (Signed)
She is going to try taking 20 mg of pravastatin and see if she can tolerate that without headaches.

## 2023-03-28 ENCOUNTER — Telehealth: Payer: Self-pay | Admitting: Family Medicine

## 2023-03-28 NOTE — Telephone Encounter (Signed)
I do not see a GI referral in chart.

## 2023-03-28 NOTE — Telephone Encounter (Signed)
Please thank the patient for checking up on this.  I didn't put in a referral because I was going to route a copy of my office note to Dr. Allegra Lai.  I thought I did that.  Patient shouldn't need another referral.  In the meantime I routed this note to Dr. Allegra Lai for input.  And I thank all involved.

## 2023-03-28 NOTE — Telephone Encounter (Signed)
Pt called asking for a status on the GASTROENTEROLOGY referral? Call back # [phone]

## 2023-03-28 NOTE — Telephone Encounter (Signed)
Called patient and reviewed all information. Patient verbalized understanding. Will call if any further questions.  

## 2023-03-29 ENCOUNTER — Telehealth: Payer: Self-pay | Admitting: Gastroenterology

## 2023-03-29 ENCOUNTER — Telehealth: Payer: Self-pay

## 2023-03-29 ENCOUNTER — Other Ambulatory Visit: Payer: Self-pay

## 2023-03-29 DIAGNOSIS — R1310 Dysphagia, unspecified: Secondary | ICD-10-CM

## 2023-03-29 MED ORDER — PANTOPRAZOLE SODIUM 40 MG PO TBEC: 40.0000 mg | DELAYED_RELEASE_TABLET | Freq: Two times a day (BID) | ORAL | 1 refills | Status: DC

## 2023-03-29 NOTE — Telephone Encounter (Signed)
Patient verbalized understanding of instructions. Sent new medication to the pharmacy. She schedule EGD for 04/17/2023. Went over instructions sent to [employer] and mailed them

## 2023-03-29 NOTE — Telephone Encounter (Signed)
Pt has question in ref to procedure would like a call back

## 2023-03-29 NOTE — Telephone Encounter (Signed)
Noted. Thanks.

## 2023-03-29 NOTE — Telephone Encounter (Signed)
Patient states she is having dental work on her front teeth fixing cavity next week and wants to make sure this okay with her EGD that is scheduled for 04/17/2023. Informed her yes that is okay

## 2023-03-29 NOTE — Telephone Encounter (Signed)
-----   Message from Toney Reil, MD sent at 03/28/2023  3:35 PM EDT ----- Morrie Sheldon  Please schedule EGD if patient is agreeable.  Also, recommend to increase Protonix to 40 mg p.o. twice daily before meals Dx: Dysphagia  RV  ----- Message ----- From: Joaquim Nam, MD Sent: 03/28/2023   1:46 PM EDT To: Toney Reil, MD

## 2023-04-10 ENCOUNTER — Telehealth: Payer: Self-pay | Admitting: Gastroenterology

## 2023-04-10 ENCOUNTER — Encounter: Payer: Self-pay | Admitting: Gastroenterology

## 2023-04-10 NOTE — Telephone Encounter (Signed)
Pt left message with questions in ref to procedure on 04/17/2023 would like a call back

## 2023-04-10 NOTE — Telephone Encounter (Signed)
Return patient call and patient wanted to double check she was supposed to stop all vitamins. Informed patient yes. She states she  will stop them tomorrow.

## 2023-04-14 ENCOUNTER — Encounter: Payer: Self-pay | Admitting: Gastroenterology

## 2023-04-17 ENCOUNTER — Ambulatory Visit: Payer: PPO | Admitting: Anesthesiology

## 2023-04-17 ENCOUNTER — Encounter
Admission: RE | Disposition: A | Discharge status: Home / Self Care | Payer: Self-pay | Source: Home / Self Care | Attending: Gastroenterology

## 2023-04-17 ENCOUNTER — Encounter: Payer: Self-pay | Admitting: Gastroenterology

## 2023-04-17 ENCOUNTER — Ambulatory Visit
Admission: RE | Admit: 2023-04-17 | Discharge: 2023-04-17 | Disposition: A | Discharge status: Home / Self Care | Payer: PPO | Attending: Gastroenterology | Admitting: Gastroenterology

## 2023-04-17 DIAGNOSIS — R1310 Dysphagia, unspecified: Secondary | ICD-10-CM | POA: Diagnosis not present

## 2023-04-17 DIAGNOSIS — R1314 Dysphagia, pharyngoesophageal phase: Secondary | ICD-10-CM | POA: Insufficient documentation

## 2023-04-17 DIAGNOSIS — K449 Diaphragmatic hernia without obstruction or gangrene: Secondary | ICD-10-CM | POA: Diagnosis not present

## 2023-04-17 DIAGNOSIS — K2960 Other gastritis without bleeding: Secondary | ICD-10-CM

## 2023-04-17 HISTORY — PX: ESOPHAGOGASTRODUODENOSCOPY (EGD) WITH PROPOFOL: SHX5813

## 2023-04-17 HISTORY — PX: BIOPSY: SHX5522

## 2023-04-17 HISTORY — DX: Headache, unspecified: R51.9

## 2023-04-17 HISTORY — DX: Solitary pulmonary nodule: R91.1

## 2023-04-17 SURGERY — ESOPHAGOGASTRODUODENOSCOPY (EGD) WITH PROPOFOL
Anesthesia: General

## 2023-04-17 MED ORDER — SODIUM CHLORIDE 0.9 % IV SOLN
INTRAVENOUS | Status: DC
  Administered 2023-04-17: 1000 mL via INTRAVENOUS

## 2023-04-17 MED ORDER — PROPOFOL 500 MG/50ML IV EMUL
INTRAVENOUS | Status: DC | PRN
  Administered 2023-04-17: 145 ug/kg/min via INTRAVENOUS

## 2023-04-17 MED ORDER — GLYCOPYRROLATE 0.2 MG/ML IJ SOLN
INTRAMUSCULAR | Status: DC | PRN
  Administered 2023-04-17: .2 mg via INTRAVENOUS

## 2023-04-17 MED ORDER — PROPOFOL 10 MG/ML IV BOLUS
INTRAVENOUS | Status: DC | PRN
  Administered 2023-04-17: 20 mg via INTRAVENOUS
  Administered 2023-04-17: 40 mg via INTRAVENOUS

## 2023-04-17 MED ORDER — LIDOCAINE HCL (CARDIAC) PF 100 MG/5ML IV SOSY
PREFILLED_SYRINGE | INTRAVENOUS | Status: DC | PRN
  Administered 2023-04-17: 100 mg via INTRAVENOUS

## 2023-04-17 NOTE — Op Note (Signed)
[facility] Patient Name: [name] Procedure Date: 04/17/2023 8:45 AM MRN: [id number] Account #: [id number] Date of Birth: 1946/03/30 Admit Type: Outpatient Age: 77 Room: [facility] ENDO ROOM 4 Gender: Female Note Status: Finalized Instrument Name: Upper Endoscope [id number] Procedure:             Upper GI endoscopy Indications:           Esophageal dysphagia Providers:             Toney Reil MD, MD Medicines:             General Anesthesia Complications:         No immediate complications. Estimated blood loss: None. Procedure:             Pre-Anesthesia Assessment:                        - Prior to the procedure, a History and Physical was                         performed, and patient medications and allergies were                         reviewed. The patient is competent. The risks and                         benefits of the procedure and the sedation options and                         risks were discussed with the patient. All questions                         were answered and informed consent was obtained.                         Patient identification and proposed procedure were                         verified by the physician, the nurse, the                         anesthesiologist, the anesthetist and the technician                         in the pre-procedure area in the procedure room in the                         endoscopy suite. Mental Status Examination: alert and                         oriented. Airway Examination: normal oropharyngeal                         airway and neck mobility. Respiratory Examination:                         clear to auscultation. CV Examination: normal.                         Prophylactic Antibiotics: The  patient does not require                         prophylactic antibiotics. Prior Anticoagulants: The                         patient has taken no anticoagulant or antiplatelet                          agents. ASA Grade Assessment: II - A patient with mild                         systemic disease. After reviewing the risks and                         benefits, the patient was deemed in satisfactory                         condition to undergo the procedure. The anesthesia                         plan was to use general anesthesia. Immediately prior                         to administration of medications, the patient was                         re-assessed for adequacy to receive sedatives. The                         heart rate, respiratory rate, oxygen saturations,                         blood pressure, adequacy of pulmonary ventilation, and                         response to care were monitored throughout the                         procedure. The physical status of the patient was                         re-assessed after the procedure.                        After obtaining informed consent, the endoscope was                         passed under direct vision. Throughout the procedure,                         the patient's blood pressure, pulse, and oxygen                         saturations were monitored continuously. The Endoscope                         was introduced through the mouth, and advanced to the  second part of duodenum. The upper GI endoscopy was                         accomplished without difficulty. The patient tolerated                         the procedure well. Findings:      The duodenal bulb and second portion of the duodenum were normal.      Bilious fluid was found in the gastric body and in the gastric antrum.       Biopsies were taken with a cold forceps for histology.      The cardia and gastric fundus were normal on retroflexion.      A small hiatal hernia was present.      The gastroesophageal junction and examined esophagus were normal.       Biopsies were taken with a cold forceps for histology. Impression:             - Normal duodenal bulb and second portion of the                         duodenum.                        - Bilious gastric fluid. Biopsied.                        - Small hiatal hernia.                        - Normal gastroesophageal junction and esophagus.                         Biopsied. Recommendation:        - Await pathology results.                        - Discharge patient to home (with escort).                        - Resume previous diet today.                        - Continue present medications.                        - Return to GI clinic as previously scheduled. Procedure Code(s):     --- Professional ---                        [phone], Esophagogastroduodenoscopy, flexible,                         transoral; with biopsy, single or multiple Diagnosis Code(s):     --- Professional ---                        K44.9, Diaphragmatic hernia without obstruction or                         gangrene  R13.14, Dysphagia, pharyngoesophageal phase CPT copyright 2022 American Medical Association. All rights reserved. The codes documented in this report are preliminary and upon coder review may  be revised to meet current compliance requirements. Dr. Libby Maw Toney Reil MD, MD 04/17/2023 9:17:05 AM This report has been signed electronically. Number of Addenda: 0 Note Initiated On: 04/17/2023 8:45 AM Estimated Blood Loss:  Estimated blood loss: none.      [facility]

## 2023-04-17 NOTE — Anesthesia Postprocedure Evaluation (Signed)
Anesthesia Post Note  Patient: [name]  Procedure(s) Performed: ESOPHAGOGASTRODUODENOSCOPY (EGD) WITH PROPOFOL  Patient location during evaluation: PACU Anesthesia Type: General Level of consciousness: awake and alert, oriented and patient cooperative Pain management: pain level controlled Vital Signs Assessment: post-procedure vital signs reviewed and stable Respiratory status: spontaneous breathing, nonlabored ventilation and respiratory function stable Cardiovascular status: blood pressure returned to baseline and stable Postop Assessment: adequate PO intake Anesthetic complications: no   No notable events documented.   Last Vitals:  Vitals:   04/17/23 0925 04/17/23 0935  BP: 123/76 133/79  Pulse: 89 82  Resp: 15 18  Temp: (!) 35.9 C   SpO2: 99% 99%    Last Pain:  Vitals:   04/17/23 0935  TempSrc:   PainSc: 0-No pain                 Reed Breech

## 2023-04-17 NOTE — H&P (Signed)
Arlyss Repress, MD [address]  [address]  [place], [place] [address]  Main: [phone]  Fax: [phone] Pager: [phone]  Primary Care Physician:  Joaquim Nam, MD Primary Gastroenterologist:  Dr. Arlyss Repress  Pre-Procedure History & Physical: HPI:  [name] is a 77 y.o. female is here for an endoscopy.   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  Could be due to hernia or old scar tissue.  prev with CT done w/o alarming findings.  Will treat episodically.  Use tylenol #3 prn.     Headache    Hyperlipidemia    Lipoma of thigh    Left   Lung nodule    Normal cardiac stress test 2014   PONV (postoperative nausea and vomiting)    nausea after hysterectomy, and 2 days after lung surgery 10/2015   Thyroid cancer (HCC) 12/2015   Thyroid nodule     Past Surgical History:  Procedure Laterality Date   ABDOMINAL HYSTERECTOMY     BREAST EXCISIONAL BIOPSY Right 1977   BREAST MASS EXCISION  1977   benign   BREAST SURGERY     CARPAL TUNNEL RELEASE Right 1978   CATARACT EXTRACTION, BILATERAL Bilateral    CHOLECYSTECTOMY  11/23/2016   Procedure: LAPAROSCOPIC CHOLECYSTECTOMY;  Surgeon: Ricarda Frame, MD;  Location: [facility] ORS;  Service: General;;   COLONOSCOPY  2009   COLONOSCOPY WITH PROPOFOL N/A 05/18/2019   Procedure: COLONOSCOPY WITH PROPOFOL;  Surgeon: Pasty Spillers, MD;  Location: [facility] ENDOSCOPY;  Service: Endoscopy;  Laterality: N/A;   COLONOSCOPY WITH PROPOFOL N/A 08/15/2019   Procedure: COLONOSCOPY WITH PROPOFOL;  Surgeon: Pasty Spillers, MD;  Location: [facility] ENDOSCOPY;  Service: Endoscopy;  Laterality: N/A;   COLONOSCOPY WITH PROPOFOL N/A 11/04/2022   Procedure: COLONOSCOPY WITH PROPOFOL;  Surgeon: Toney Reil, MD;  Location: [facility] ENDOSCOPY;  Service:  Gastroenterology;  Laterality: N/A;   DILATION AND CURETTAGE OF UTERUS  2001   ELECTROMAGNETIC NAVIGATION BROCHOSCOPY N/A 08/25/2015   Procedure: ELECTROMAGNETIC NAVIGATION BRONCHOSCOPY;  Surgeon: Erin Fulling, MD;  Location: [facility] ORS;  Service: Cardiopulmonary;  Laterality: N/A;   ENDOBRONCHIAL ULTRASOUND N/A 08/25/2015   Procedure: ENDOBRONCHIAL ULTRASOUND;  Surgeon: Erin Fulling, MD;  Location: [facility] ORS;  Service: Cardiopulmonary;  Laterality: N/A;   ESOPHAGOGASTRODUODENOSCOPY  2002, 11/15/07   Normal (Dr. Troy Sine)   ESOPHAGOGASTRODUODENOSCOPY (EGD) WITH PROPOFOL N/A 05/18/2019   Procedure: ESOPHAGOGASTRODUODENOSCOPY (EGD) WITH PROPOFOL;  Surgeon: Pasty Spillers, MD;  Location: [facility] ENDOSCOPY;  Service: Endoscopy;  Laterality: N/A;   ESOPHAGOGASTRODUODENOSCOPY (EGD) WITH PROPOFOL N/A 08/15/2019   Procedure: ESOPHAGOGASTRODUODENOSCOPY (EGD) WITH PROPOFOL;  Surgeon: Pasty Spillers, MD;  Location: [facility] ENDOSCOPY;  Service: Endoscopy;  Laterality: N/A;   EYE SURGERY     FACIAL COSMETIC SURGERY  01/13/2010   mini facelift   HERNIA REPAIR  1976   INSERTION OF MESH  07/06/2022   Procedure: INSERTION OF MESH;  Surgeon: Carolan Shiver, MD;  Location: [facility] ORS;  Service: General;;   OOPHORECTOMY     PTOSIS REPAIR  12/08/2021   THORACOTOMY/LOBECTOMY Right 10/27/2015   Procedure: THORACOTOMY/LOBECTOMY;  Surgeon: Hulda Marin, MD;  Location: [facility] ORS;  Service: Thoracic;  Laterality: Right;   THYROIDECTOMY N/A 12/29/2015   Procedure: THYROIDECTOMY;  Surgeon: Linus Salmons, MD;  Location: [facility] ORS;  Service: ENT;  Laterality: N/A;  TOTAL VAGINAL HYSTERECTOMY  12/30/2008   for fibroid pain (Dr. Huntley Dec)   XI ROBOTIC ASSISTED SMALL BOWEL RESECTION N/A 06/28/2022   Procedure: XI ROBOTIC ASSISTED SMALL BOWEL RESECTION;  Surgeon: Carolan Shiver, MD;  Location: [facility] ORS;  Service: General;  Laterality: N/A;    Prior to Admission medications   Medication Sig Start Date End Date  Taking? Authorizing Provider  aspirin EC 81 MG tablet Take 1 tablet (81 mg total) by mouth daily. 11/29/19   Joaquim Nam, MD  Biotin 1000 MCG tablet Take 1,000 mcg by mouth daily.    [provider]  Calcium & Magnesium Carbonates (MYLANTA PO) Take by mouth.    [provider]  cholecalciferol (VITAMIN D3) 25 MCG (1000 UNIT) tablet Take 1,000 Units by mouth daily.    [provider]  Coenzyme Q10 200 MG capsule Take 100 mg by mouth daily.    [provider]  levothyroxine (SYNTHROID) 75 MCG tablet TAKE 1 TABLET BY MOUTH EVERY DAY BEFORE BREAKFAST 01/19/23   Earna Coder, MD  Multiple Vitamins-Minerals (MULTIVITAMIN) tablet Take 1 tablet by mouth daily. 08/25/22   Joaquim Nam, MD  pantoprazole (PROTONIX) 40 MG tablet Take 1 tablet (40 mg total) by mouth 2 (two) times daily before a meal. 03/29/23   Mathayus Stanbery, Loel Dubonnet, MD  Potassium Gluconate 595 MG CAPS Take 595 mg by mouth daily.     [provider]  pravastatin (PRAVACHOL) 40 MG tablet Take 0.5 tablets (20 mg total) by mouth daily. 03/20/23   Joaquim Nam, MD    Allergies as of 03/29/2023 - Review Complete 03/20/2023  Allergen Reaction Noted   Atorvastatin  07/20/2010   Ciprofloxacin  08/14/2007   Epinephrine  04/26/2012   Metoprolol succinate Swelling 03/16/2011   Nortriptyline  04/15/2022   Simvastatin Other (See Comments) 03/16/2011   Sulfa antibiotics Other (See Comments) 08/14/2007    Family History  Problem Relation Age of Onset   Transient ischemic attack Mother    Hypertension Mother    Stroke Mother        mini strokes   Hypertension Sister    Cancer Sister        ovarian   Hypertension Sister    Hypertension Sister    Hypertension Sister    Breast cancer Neg Hx    Colon cancer Neg Hx     Social History   Socioeconomic History   Marital status: Married    Spouse name: Not on file   Number of children: Not on file   Years of education: Not on file    Highest education level: Not on file  Occupational History   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  He has short term memory loss.     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   Previously had an adopted son who died in 04/26/2020.   Social Determinants of Health   Financial Resource Strain: Low Risk  (08/16/2022)   Overall Financial Resource Strain (CARDIA)    Difficulty  of Paying Living Expenses: Not hard at all  Food Insecurity: No Food Insecurity (08/16/2022)   Hunger Vital Sign    Worried About Running Out of Food in the Last Year: Never true    Ran Out of Food in the Last Year: Never true  Transportation Needs: No Transportation Needs (08/16/2022)   PRAPARE - Administrator, Civil Service (Medical): No    Lack of Transportation (Non-Medical): No  Physical Activity: Sufficiently Active (08/16/2022)   Exercise Vital Sign    Days of Exercise per Week: 5 days    Minutes of Exercise per Session: 30 min  Stress: No Stress Concern Present (08/16/2022)   [employer] of Occupational Health - Occupational Stress Questionnaire    Feeling of Stress : Not at all  Social Connections: Socially Integrated (08/16/2022)   Social Connection and Isolation Panel [NHANES]    Frequency of Communication with Friends and Family: More than three times a week    Frequency of Social Gatherings with Friends and Family: More than three times a week    Attends Religious Services: More than 4 times per year    Active Member of [employer] or Organizations: Yes    Attends Engineer, structural: More than  4 times per year    Marital Status: Married  Catering manager Violence: Not At Risk (08/16/2022)   Humiliation, Afraid, Rape, and Kick questionnaire    Fear of Current or Ex-Partner: No    Emotionally Abused: No    Physically Abused: No    Sexually Abused: No    Review of Systems: See HPI, otherwise negative ROS  Physical Exam: BP (!) 155/80   Pulse 72   Temp (!) 96.4 F (35.8 C) (Temporal)   Resp 16   Ht 5\' 4"  (1.626 m)   Wt 56.5 kg   SpO2 99%   BMI 21.40 kg/m  General:   Alert,  pleasant and cooperative in NAD Head:  Normocephalic and atraumatic. Neck:  Supple; no masses or thyromegaly. Lungs:  Clear throughout to auscultation.    Heart:  Regular rate and rhythm. Abdomen:  Soft, nontender and nondistended. Normal bowel sounds, without guarding, and without rebound.   Neurologic:  Alert and  oriented x4;  grossly normal neurologically.  Impression/Plan: [name] is here for an endoscopy to be performed for dysphagia  Risks, benefits, limitations, and alternatives regarding  endoscopy have been reviewed with the patient.  Questions have been answered.  All parties agreeable.   Lannette Donath, MD  04/17/2023, 8:50 AM

## 2023-04-17 NOTE — Anesthesia Preprocedure Evaluation (Addendum)
Anesthesia Evaluation  Patient identified by MRN, date of birth, ID band Patient awake    Reviewed: Allergy & Precautions, NPO status , Patient's Chart, lab work & pertinent test results  History of Anesthesia Complications (+) PONV and history of anesthetic complications  Airway Mallampati: II   Neck ROM: Full    Dental   Bridges :   Pulmonary  Lung adenocarcinoma   Pulmonary exam normal breath sounds clear to auscultation       Cardiovascular hypertension, Normal cardiovascular exam Rhythm:Regular Rate:Normal  ECG 07/06/22: normal   Neuro/Psych negative neurological ROS     GI/Hepatic ,GERD  ,,  Endo/Other  Hypothyroidism    Renal/GU negative Renal ROS     Musculoskeletal   Abdominal   Peds  Hematology negative hematology ROS (+)   Anesthesia Other Findings   Reproductive/Obstetrics                             Anesthesia Physical Anesthesia Plan  ASA: 2  Anesthesia Plan: General   Post-op Pain Management:    Induction: Intravenous  PONV Risk Score and Plan: 4 or greater and Propofol infusion, TIVA and Treatment may vary due to age or medical condition  Airway Management Planned: Natural Airway  Additional Equipment:   Intra-op Plan:   Post-operative Plan:   Informed Consent: I have reviewed the patients History and Physical, chart, labs and discussed the procedure including the risks, benefits and alternatives for the proposed anesthesia with the patient or authorized representative who has indicated his/her understanding and acceptance.       Plan Discussed with: CRNA  Anesthesia Plan Comments: (LMA/GETA backup discussed.  Patient consented for risks of anesthesia including but not limited to:  - adverse reactions to medications - damage to eyes, teeth, lips or other oral mucosa - nerve damage due to positioning  - sore throat or hoarseness - damage to heart,  brain, nerves, lungs, other parts of body or loss of life  Informed patient about role of CRNA in peri- and intra-operative care.  Patient voiced understanding.)        Anesthesia Quick Evaluation

## 2023-04-17 NOTE — Transfer of Care (Signed)
Immediate Anesthesia Transfer of Care Note  Patient: [name]  Procedure(s) Performed: ESOPHAGOGASTRODUODENOSCOPY (EGD) WITH PROPOFOL  Patient Location: Endoscopy Unit  Anesthesia Type:General  Level of Consciousness: drowsy and patient cooperative  Airway & Oxygen Therapy: Patient Spontanous Breathing and Patient connected to face mask oxygen  Post-op Assessment: Report given to RN and Post -op Vital signs reviewed and stable  Post vital signs: Reviewed and stable  Last Vitals:  Vitals Value Taken Time  BP 126/74 04/17/23 0915  Temp    Pulse 95 04/17/23 0915  Resp 13 04/17/23 0915  SpO2 100 % 04/17/23 0915    Last Pain:  Vitals:   04/17/23 0915  TempSrc:   PainSc: Asleep         Complications: No notable events documented.

## 2023-04-17 NOTE — Anesthesia Procedure Notes (Signed)
Procedure Name: General with mask airway Date/Time: 04/17/2023 9:00 AM  Performed by: Mohammed Kindle, CRNAPre-anesthesia Checklist: Patient identified, Emergency Drugs available, Suction available and Patient being monitored Patient Re-evaluated:Patient Re-evaluated prior to induction Oxygen Delivery Method: Simple face mask Induction Type: IV induction Placement Confirmation: positive ETCO2, CO2 detector and breath sounds checked- equal and bilateral Dental Injury: Teeth and Oropharynx as per pre-operative assessment

## 2023-04-18 ENCOUNTER — Encounter: Payer: Self-pay | Admitting: Gastroenterology

## 2023-04-20 ENCOUNTER — Encounter: Payer: Self-pay | Admitting: Gastroenterology

## 2023-04-27 ENCOUNTER — Telehealth: Payer: Self-pay | Admitting: Gastroenterology

## 2023-04-27 NOTE — Telephone Encounter (Signed)
Called and informed patient that Dr. Allegra Lai was on Vacation and she would not be back to a couple of weeks so I would get a another provider to review the results.  Please review the path results from 04/17/2023

## 2023-04-27 NOTE — Telephone Encounter (Signed)
Patient called in because she can't see her result from her test. She said that she not sure on what to do. Dr. Allegra Lai said that she might need to change her medication or add a medication.

## 2023-04-30 ENCOUNTER — Other Ambulatory Visit: Payer: Self-pay | Admitting: Family Medicine

## 2023-05-01 NOTE — Telephone Encounter (Signed)
Informed patient that I did send the results to another provider but usually they do not review other providers path reports if they do not do them. Informed her that she would be back in the office on 05/22/2023 and she would review them as soon as she gets back. She states she guess she will just wait till then. 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

## 2023-05-01 NOTE — Telephone Encounter (Signed)
Patient called in because she can't see her result from her test. Morrie Sheldon took the call

## 2023-05-18 ENCOUNTER — Inpatient Hospital Stay: Payer: PPO | Attending: Internal Medicine

## 2023-05-18 DIAGNOSIS — Z7982 Long term (current) use of aspirin: Secondary | ICD-10-CM | POA: Diagnosis not present

## 2023-05-18 DIAGNOSIS — Z85850 Personal history of malignant neoplasm of thyroid: Secondary | ICD-10-CM | POA: Insufficient documentation

## 2023-05-18 DIAGNOSIS — Z85118 Personal history of other malignant neoplasm of bronchus and lung: Secondary | ICD-10-CM | POA: Insufficient documentation

## 2023-05-18 DIAGNOSIS — Z79899 Other long term (current) drug therapy: Secondary | ICD-10-CM | POA: Diagnosis not present

## 2023-05-18 DIAGNOSIS — D696 Thrombocytopenia, unspecified: Secondary | ICD-10-CM | POA: Insufficient documentation

## 2023-05-18 DIAGNOSIS — C3431 Malignant neoplasm of lower lobe, right bronchus or lung: Secondary | ICD-10-CM

## 2023-05-18 LAB — CBC WITH DIFFERENTIAL/PLATELET
Abs Immature Granulocytes: 0.03 10*3/uL (ref 0.00–0.07)
Basophils Absolute: 0.1 10*3/uL (ref 0.0–0.1)
Basophils Relative: 1 %
Eosinophils Absolute: 0.1 10*3/uL (ref 0.0–0.5)
Eosinophils Relative: 1 %
HCT: 39.6 % (ref 36.0–46.0)
Hemoglobin: 13.1 g/dL (ref 12.0–15.0)
Immature Granulocytes: 0 %
Lymphocytes Relative: 26 %
Lymphs Abs: 1.9 10*3/uL (ref 0.7–4.0)
MCH: 31.6 pg (ref 26.0–34.0)
MCHC: 33.1 g/dL (ref 30.0–36.0)
MCV: 95.7 fL (ref 80.0–100.0)
Monocytes Absolute: 0.5 10*3/uL (ref 0.1–1.0)
Monocytes Relative: 7 %
Neutro Abs: 4.8 10*3/uL (ref 1.7–7.7)
Neutrophils Relative %: 65 %
Platelets: 152 10*3/uL (ref 150–400)
RBC: 4.14 MIL/uL (ref 3.87–5.11)
RDW: 14.4 % (ref 11.5–15.5)
WBC: 7.4 10*3/uL (ref 4.0–10.5)
nRBC: 0 % (ref 0.0–0.2)

## 2023-05-18 LAB — COMPREHENSIVE METABOLIC PANEL
ALT: 25 U/L (ref 0–44)
AST: 22 U/L (ref 15–41)
Albumin: 4.2 g/dL (ref 3.5–5.0)
Alkaline Phosphatase: 46 U/L (ref 38–126)
Anion gap: 10 (ref 5–15)
BUN: 14 mg/dL (ref 8–23)
CO2: 28 mmol/L (ref 22–32)
Calcium: 9.1 mg/dL (ref 8.9–10.3)
Chloride: 99 mmol/L (ref 98–111)
Creatinine, Ser: 0.75 mg/dL (ref 0.44–1.00)
GFR, Estimated: >60 mL/min (ref >60)
Glucose, Bld: 97 mg/dL (ref 70–99)
Potassium: 4.1 mmol/L (ref 3.5–5.1)
Sodium: 137 mmol/L (ref 135–145)
Total Bilirubin: 0.7 mg/dL (ref 0.3–1.2)
Total Protein: 6.9 g/dL (ref 6.5–8.1)

## 2023-05-19 LAB — THYROID PANEL WITH TSH
Free Thyroxine Index: 2.1 (ref 1.2–4.9)
T3 Uptake Ratio: 29 % (ref 24–39)
T4, Total: 7.4 ug/dL (ref 4.5–12.0)
TSH: 12.7 u[IU]/mL — ABNORMAL HIGH (ref 0.450–4.500)

## 2023-05-21 NOTE — Telephone Encounter (Signed)
Please inform patient that the pathology results from upper endoscopy did not reveal any infection in her stomach.  Pathology results from esophagus that showed mild acid reflux changes only.  Please check with her if Protonix 40 mg twice daily is helping with her symptoms  RV

## 2023-05-22 ENCOUNTER — Ambulatory Visit
Admission: RE | Admit: 2023-05-22 | Discharge: 2023-05-22 | Disposition: A | Discharge status: Home / Self Care | Payer: PPO | Source: Ambulatory Visit | Attending: Internal Medicine | Admitting: Internal Medicine

## 2023-05-22 DIAGNOSIS — E034 Atrophy of thyroid (acquired): Secondary | ICD-10-CM | POA: Diagnosis not present

## 2023-05-22 DIAGNOSIS — I700 Atherosclerosis of aorta: Secondary | ICD-10-CM | POA: Diagnosis not present

## 2023-05-22 DIAGNOSIS — C3490 Malignant neoplasm of unspecified part of unspecified bronchus or lung: Secondary | ICD-10-CM | POA: Diagnosis not present

## 2023-05-22 DIAGNOSIS — C3431 Malignant neoplasm of lower lobe, right bronchus or lung: Secondary | ICD-10-CM | POA: Diagnosis not present

## 2023-05-22 MED ORDER — IOHEXOL 300 MG/ML  SOLN
75.0000 mL | Freq: Once | INTRAMUSCULAR | Status: AC | PRN
  Administered 2023-05-22: 75 mL via INTRAVENOUS

## 2023-05-22 NOTE — Telephone Encounter (Signed)
Patient verbalized understanding of instructions  

## 2023-05-22 NOTE — Telephone Encounter (Signed)
She states she has not been taking the Pantoprazole twice daily because she states you read so much bad things about acid reflex medications. She states she has been doing Pantoprazole 40mg  once a day in the morning and then taking Pepcid as needed and takes it 5 out of 7 days in a week in the afternoon or night. Then she will also take Mylanta as needed and takes it 5 out of 7 days in a week in the afternoon or night. She state she will get a acid burning sensation and have to take something.

## 2023-05-22 NOTE — Telephone Encounter (Signed)
I am okay with her taking Pepcid as well as other antacid medications as needed along with Protonix 40 mg once a day  RV

## 2023-05-31 ENCOUNTER — Inpatient Hospital Stay: Payer: PPO | Admitting: Internal Medicine

## 2023-05-31 ENCOUNTER — Encounter: Payer: Self-pay | Admitting: Internal Medicine

## 2023-05-31 VITALS — BP 135/74 | HR 83 | Temp 99.0°F | Resp 16 | Wt 126.6 lb

## 2023-05-31 DIAGNOSIS — Z85118 Personal history of other malignant neoplasm of bronchus and lung: Secondary | ICD-10-CM | POA: Diagnosis not present

## 2023-05-31 DIAGNOSIS — C3431 Malignant neoplasm of lower lobe, right bronchus or lung: Secondary | ICD-10-CM | POA: Diagnosis not present

## 2023-05-31 NOTE — Progress Notes (Signed)
[facility] OFFICE PROGRESS NOTE  Patient Care Team: Joaquim Nam, MD as PCP - General (Family Medicine) Debbe Odea, MD as PCP - Cardiology (Cardiology) Linus Salmons, MD as Consulting Physician (Otolaryngology) Hulda Marin, MD (Inactive) as Consulting Physician (Cardiothoracic Surgery) Earna Coder, MD as Consulting Physician (Internal Medicine) Galen Manila, MD as Consulting Physician (Ophthalmology) Ricarda Frame, MD as Consulting Physician (General Surgery) Kathyrn Sheriff, [facility] (Inactive) as Pharmacist (Pharmacist)   Cancer Staging  Thyroid cancer [facility]) Staging form: Thyroid - Papillary or Follicular (Under 45 years), AJCC 7th Edition - Clinical: Stage I (T1, N0, M0) - Signed by Johney Maine, MD on 09/11/2015 Laterality: Right    Oncology History Overview Note  # NOV 2016- RLL [place] ca T1N0 [incidental; Dr.Simonds; Bronc- s/p RLlobectomy; Dr.Oaks;Dec 2016]  # FEB 2017- 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 STAGE I; s/p Total Thyroidetcomy [Dr.Chapman];NO RAIU.  LOW RISK; but detectable thyroglobulin- on Synthroid  #Survivorship-pending  DIAGNOSIS: Lung cancer -stage I #thyroid cancer low risk  GOALS: Cure  CURRENT/MOST RECENT THERAPY: Surveillance    Thyroid cancer (HCC)  Cancer of lower lobe of right lung (HCC)  05/06/2016 Initial Diagnosis   Malignant neoplasm of lower lobe, right bronchus or lung (HCC)    INTERVAL HISTORY: Accompanied by her husband.  Ambulating independently.   [name] 77 y.o.  female pleasant patient above history of stage I lung cancer and also stage I thyroid cancer on synthroid currently on surveillance is here for follow-up/review results of the CT scan.   Pain in back has pressure type pain intermittently. Denies dyspnea. No phlegm or mucus production. Had endoscopy and it was normal.   Feels occ discomfort  swallowing sensation that is why endoscopy was done.  Patient denies any fever or chills or cough.  Denies any nausea vomiting abdominal pain.  Denies any headaches.  Complains of chronic back pain not any worse.  Review of Systems  Constitutional:  Positive for malaise/fatigue. Negative for chills, diaphoresis, fever and weight loss.  HENT:  Negative for nosebleeds and sore throat.   Eyes:  Negative for double vision.  Respiratory:  Negative for cough, hemoptysis, sputum production, shortness of breath and wheezing.   Cardiovascular:  Negative for chest pain, palpitations, orthopnea and leg swelling.  Gastrointestinal:  Negative for abdominal pain, blood in stool, constipation, diarrhea, heartburn, melena and vomiting.  Genitourinary:  Negative for dysuria, frequency and urgency.  Musculoskeletal:  Positive for back pain. Negative for joint pain.  Skin: Negative.  Negative for itching and rash.  Neurological:  Positive for tingling. Negative for dizziness, focal weakness, weakness and headaches.  Endo/Heme/Allergies:  Does not bruise/bleed easily.  Psychiatric/Behavioral:  Negative for depression. The patient is not nervous/anxious and does not have insomnia.       PAST MEDICAL HISTORY :  Past Medical History:  Diagnosis Date   Adenocarcinoma of right lung (HCC) 10/01/2015   GERD (gastroesophageal reflux disease)    GIB (gastrointestinal bleeding)    Groin pain    Along superior/laterl R inguinal canal.  Could be due to hernia or old scar tissue.  prev with CT done w/o alarming findings.  Will treat episodically.  Use tylenol #3 prn.     Headache    Hyperlipidemia    Lipoma of thigh    Left   Lung nodule    Normal cardiac stress test 2014   PONV (postoperative nausea and vomiting)  nausea after hysterectomy, and 2 days after lung surgery 10/2015   Thyroid cancer (HCC) 12/2015   Thyroid nodule     PAST SURGICAL HISTORY :   Past Surgical History:  Procedure Laterality Date    ABDOMINAL HYSTERECTOMY     BIOPSY  04/17/2023   Procedure: BIOPSY;  Surgeon: Toney Reil, MD;  Location: [facility];  Service: Gastroenterology;;   BREAST EXCISIONAL BIOPSY Right 1977   BREAST MASS EXCISION  1977   benign   BREAST SURGERY     CARPAL TUNNEL RELEASE Right 1978   CATARACT EXTRACTION, BILATERAL Bilateral    CHOLECYSTECTOMY  11/23/2016   Procedure: LAPAROSCOPIC CHOLECYSTECTOMY;  Surgeon: Ricarda Frame, MD;  Location: [facility] OR[facility];  Service: General;;   COLONOSCOPY  2009   COLONOSCOPY WITH PROPOFOL N/A 05/18/2019   Procedure: COLONOSCOPY WITH PROPOFOL;  Surgeon: Pasty Spillers, MD;  Location: [facility] END[facility]COPY;  Service: Endoscopy;  Laterality: N/A;   COLONOSCOPY WITH PROPOFOL N/A 08/15/2019   Procedure: COLONOSCOPY WITH PROPOFOL;  Surgeon: Pasty Spillers, MD;  Location: [facility];  Service: Endoscopy;  Laterality: N/A;   COLONOSCOPY WITH PROPOFOL N/A 11/04/2022   Procedure: COLONOSCOPY WITH PROPOFOL;  Surgeon: Toney Reil, MD;  Location: [facility] ENDOSCOPY;  Service: Gastroenterology;  Laterality: N/A;   DILATION AND CURETTAGE OF UTERUS  2001   ELECTROMAGNETIC NAVIGATION BROCHOSCOPY N/A 08/25/2015   Procedure: ELECTROMAGNETIC NAVIGATION BRONCHOSCOPY;  Surgeon: Erin Fulling, MD;  Location: [facility] OR[facility];  Service: Cardiopulmonary;  Laterality: N/A;   ENDOBRONCHIAL ULTRASOUND N/A 08/25/2015   Procedure: ENDOBRONCHIAL ULTRASOUND;  Surgeon: Erin Fulling, MD;  Location: [facility];  Service: Cardiopulmonary;  Laterality: N/A;   ESOPHAGOGASTRODUODENOSCOPY  2002, 11/15/07   Normal (Dr. Troy Sine)   ESOPHAGOGASTRODUODENOSCOPY (EGD) WITH PROPOFOL N/A 05/18/2019   Procedure: ESOPHAGOGASTRODUODENOSCOPY (EGD) WITH PROPOFOL;  Surgeon: Pasty Spillers, MD;  Location: [facility];  Service: Endoscopy;  Laterality: N/A;   ESOPHAGOGASTRODUODENOSCOPY (EGD) WITH PROPOFOL N/A 08/15/2019   Procedure: ESOPHAGOGASTRODUODENOSCOPY (EGD) WITH PROPOFOL;  Surgeon: Pasty Spillers, MD;  Location: [facility];  Service: Endoscopy;  Laterality: N/A;   ESOPHAGOGASTRODUODENOSCOPY (EGD) WITH PROPOFOL N/A 04/17/2023   Procedure: ESOPHAGOGASTRODUODENOSCOPY (EGD) WITH PROPOFOL;  Surgeon: Toney Reil, MD;  Location: [facility] ENDOSCOPY;  Service: Gastroenterology;  Laterality: N/A;   EYE SURGERY     FACIAL COSMETIC SURGERY  01/13/2010   mini facelift   HERNIA REPAIR  1976   INSERTION OF MESH  07/06/2022   Procedure: INSERTION OF MESH;  Surgeon: Carolan Shiver, MD;  Location: [facility] OR[facility];  Service: General;;   OOPHORECTOMY     PTOSIS REPAIR  12/08/2021   THORACOTOMY/LOBECTOMY Right 10/27/2015   Procedure: THORACOTOMY/LOBECTOMY;  Surgeon: Hulda Marin, MD;  Location: [facility] OR[facility];  Service: Thoracic;  Laterality: Right;   THYROIDECTOMY N/A 12/29/2015   Procedure: THYROIDECTOMY;  Surgeon: Linus Salmons, MD;  Location: [facility];  Service: ENT;  Laterality: N/A;   TOTAL VAGINAL HYSTERECTOMY  12/30/2008   for fibroid pain (Dr. Huntley Dec)   XI ROBOTIC ASSISTED SMALL BOWEL RESECTION N/A 06/28/2022   Procedure: XI ROBOTIC ASSISTED SMALL BOWEL RESECTION;  Surgeon: Carolan Shiver, MD;  Location: [facility] OR[facility];  Service: General;  Laterality: N/A;    FAMILY HISTORY :   Family History  Problem Relation Age of Onset   Transient ischemic attack Mother    Hypertension Mother    Stroke Mother        mini strokes   Hypertension Sister    Cancer Sister        ovarian  Hypertension Sister    Hypertension Sister    Hypertension Sister    Breast cancer Neg Hx    Colon cancer Neg Hx     SOCIAL HISTORY:   Social History   Tobacco Use   Smoking status: Never   Smokeless tobacco: Never  Vaping Use   Vaping status: Never Used  Substance Use Topics   Alcohol use: No    Alcohol/week: 0.0 standard drinks of alcohol   Drug use: No    ALLERGIES:  is allergic to atorvastatin, ciprofloxacin, epinephrine, metoprolol succinate, nortriptyline, simvastatin, and sulfa  antibiotics.  MEDICATIONS:  Current Outpatient Medications  Medication Sig Dispense Refill   aspirin EC 81 MG tablet Take 1 tablet (81 mg total) by mouth daily.     Biotin 1000 MCG tablet Take 1,000 mcg by mouth daily.     Calcium & Magnesium Carbonates (MYLANTA PO) Take by mouth.     cholecalciferol (VITAMIN D3) 25 MCG (1000 UNIT) tablet Take 1,000 Units by mouth daily.     Coenzyme Q10 200 MG capsule Take 100 mg by mouth daily.     levothyroxine (SYNTHROID) 75 MCG tablet TAKE 1 TABLET BY MOUTH EVERY DAY BEFORE BREAKFAST 90 tablet 1   Multiple Vitamins-Minerals (MULTIVITAMIN) tablet Take 1 tablet by mouth daily.     pantoprazole (PROTONIX) 40 MG tablet Take 1 tablet (40 mg total) by mouth 2 (two) times daily before a meal. 60 tablet 1   Potassium Gluconate 595 MG CAPS Take 595 mg by mouth daily.      pravastatin (PRAVACHOL) 40 MG tablet TAKE 1 TABLET BY MOUTH EVERY DAY 90 tablet 3   No current facility-administered medications for this visit.    PHYSICAL EXAMINATION: ECOG PERFORMANCE STATUS: 1 - Symptomatic but completely ambulatory  BP 135/74 (BP Location: Left Arm, Patient Position: Sitting)   Pulse 83   Temp 99 F (37.2 C) (Oral)   Resp 16   Wt 126 lb 9.6 oz (57.4 kg)   SpO2 98%   BMI 21.73 kg/m   Filed Weights   05/31/23 1306  Weight: 126 lb 9.6 oz (57.4 kg)        Physical Exam HENT:     Head: Normocephalic and atraumatic.     Mouth/Throat:     Pharynx: No oropharyngeal exudate.  Eyes:     Pupils: Pupils are equal, round, and reactive to light.  Cardiovascular:     Rate and Rhythm: Normal rate and regular rhythm.  Pulmonary:     Effort: No respiratory distress.     Breath sounds: No wheezing.  Abdominal:     General: Bowel sounds are normal. There is no distension.     Palpations: Abdomen is soft. There is no mass.     Tenderness: There is no abdominal tenderness. There is no guarding or rebound.  Musculoskeletal:        General: No tenderness. Normal  range of motion.     Cervical back: Normal range of motion and neck supple.  Skin:    General: Skin is warm.  Neurological:     Mental Status: She is alert and oriented to person, place, and time.  Psychiatric:        Mood and Affect: Affect normal.    LABORATORY DATA:  I have reviewed the data as listed    Component Value Date/Time   NA 137 05/18/2023 1404   NA 141 07/01/2013 0103   K 4.1 05/18/2023 1404   K 3.8 07/01/2013 0103  CL 99 05/18/2023 1404   CL 110 (H) 07/01/2013 0103   CO2 28 05/18/2023 1404   CO2 27 07/01/2013 0103   GLUCOSE 97 05/18/2023 1404   GLUCOSE 88 07/01/2013 0103   BUN 14 05/18/2023 1404   BUN 11 07/01/2013 0103   CREATININE 0.75 05/18/2023 1404   CREATININE 0.68 07/01/2013 0103   CALCIUM 9.1 05/18/2023 1404   CALCIUM 8.2 (L) 07/01/2013 0103   PROT 6.9 05/18/2023 1404   PROT 5.5 (L) 07/01/2013 0103   ALBUMIN 4.2 05/18/2023 1404   ALBUMIN 3.0 (L) 07/01/2013 0103   AST 22 05/18/2023 1404   AST 19 07/01/2013 0103   ALT 25 05/18/2023 1404   ALT 24 07/01/2013 0103   ALKPHOS 46 05/18/2023 1404   ALKPHOS 55 07/01/2013 0103   BILITOT 0.7 05/18/2023 1404   BILITOT 0.7 07/01/2013 0103   GFRNONAA >60 05/18/2023 1404   GFRNONAA >60 07/01/2013 0103   GFRAA >60 05/19/2019 0117   GFRAA >60 07/01/2013 0103    No results found for: "SPEP", "UPEP"  Lab Results  Component Value Date   WBC 7.4 05/18/2023   NEUTROABS 4.8 05/18/2023   HGB 13.1 05/18/2023   HCT 39.6 05/18/2023   MCV 95.7 05/18/2023   PLT 152 05/18/2023      Chemistry      Component Value Date/Time   NA 137 05/18/2023 1404   NA 141 07/01/2013 0103   K 4.1 05/18/2023 1404   K 3.8 07/01/2013 0103   CL 99 05/18/2023 1404   CL 110 (H) 07/01/2013 0103   CO2 28 05/18/2023 1404   CO2 27 07/01/2013 0103   BUN 14 05/18/2023 1404   BUN 11 07/01/2013 0103   CREATININE 0.75 05/18/2023 1404   CREATININE 0.68 07/01/2013 0103      Component Value Date/Time   CALCIUM 9.1 05/18/2023 1404    CALCIUM 8.2 (L) 07/01/2013 0103   ALKPHOS 46 05/18/2023 1404   ALKPHOS 55 07/01/2013 0103   AST 22 05/18/2023 1404   AST 19 07/01/2013 0103   ALT 25 05/18/2023 1404   ALT 24 07/01/2013 0103   BILITOT 0.7 05/18/2023 1404   BILITOT 0.7 07/01/2013 0103       RADIOGRAPHIC STUDIES: I have personally reviewed the radiological images as listed and agreed with the findings in the report. No results found.   ASSESSMENT & PLAN:  Cancer of lower lobe of right lung (HCC) # RLL Lung ca; Stage I;  no adjuvant therapy.  JULY 27th 2024- CT - Stable.    # RUL-groundglass opacity JUNE 2023- CT scan: RUL  Progressive enlargement of a ground-glass density right upper lobe nodule over the last 2.5 years, suspicious for low-grade adenocarcinoma. JULY 28th- CT chest: Multifocal areas of subtle nodularity identified. The areas seen left lower lobe previously is decreasing and may be infectious or inflammatory. Trace residual. Other areas are also either similar decreasing. Recommend continued follow-up surveillance in 6 months.  Stable nodularity along the spleen. No developing new mass lesion, fluid collection or lymph node enlargement in the thorax.   # JAN 17th, 2024- CT Interval development of new clustered nodularity in the left lower lobe with dominant nodule in this region measuring 6 mm. Imaging features are likely infectious/inflammatory.  Patient is asymptomatic- CT JULY 2024- resolving- stable.  # Thyroid cancer [FEB 2017] status post thyroidectomy low risk stage I; on Synthroid 75 mcg. FEB 2023-thyroid ultrasound no evidence of recurrence. JULY 2024- TSH- 12; but T4-WNL [ asymptomatic]; at 75 mcg-had  fatigue palpitations- continue current dose.  Space out from [employer]- hold off changing the dose. Awaiting thyroglobulin levels.   # Jan 2021-  BMD- Osteopenia- T score=- 2.0; improved from 4 years ago; ca+vit D BID-stable. The BMD measured at AP Spine L1-L3 is 0.934 g/cm2 with a T-score of -2.0.  awaiting prolia as per PCP.   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  Monitor for now     # DISPOSITION: # follow up in 6 months-MD;]cbc/cmp/ thyroid profile/ Thyroglobulin/and thyroglobulin antibodies-3 week prior];CT chest with contrast prior- - Dr.B   # I reviewed the blood work- with the patient in detail; also reviewed the imaging independently [as summarized above]; and with the patient in detail.      Orders Placed This Encounter  Procedures   CT CHEST W CONTRAST    Standing Status:   Future    Standing Expiration Date:   05/30/2024    Order Specific Question:   If indicated for the ordered procedure, I authorize the administration of contrast media per Radiology protocol    Answer:   Yes    Order Specific Question:   Preferred imaging location?    Answer:   [facility]   CBC with Differential (Cancer Center Only)    Standing Status:   Future    Standing Expiration Date:   05/30/2024   CMP (Cancer Center only)    Standing Status:   Future    Standing Expiration Date:   05/30/2024   Thyroid Panel With TSH    Standing Status:   Future    Standing Expiration Date:   05/30/2024   TgAb+Thyroglobulin IMA or RIA    Standing Status:   Future    Standing Expiration Date:   05/30/2024   All questions were answered. The patient knows to call the clinic with any problems, questions or concerns.      Earna Coder, MD 05/31/2023 2:02 PM

## 2023-05-31 NOTE — Progress Notes (Signed)
Pain in back has pressure type pain intermittently. Denies dyspnea. No phlegm or mucus production. Had endoscopy and it was normal. Feels occ discomfort swallowing sensation that is why endoscopy was done.

## 2023-05-31 NOTE — Assessment & Plan Note (Addendum)
#   RLL Lung ca; Stage I;  no adjuvant therapy.  JULY 27th 2024- CT - Stable.    # RUL-groundglass opacity JUNE 2023- CT scan: RUL  Progressive enlargement of a ground-glass density right upper lobe nodule over the last 2.5 years, suspicious for low-grade adenocarcinoma. JULY 28th- CT chest: Multifocal areas of subtle nodularity identified. The areas seen left lower lobe previously is decreasing and may be infectious or inflammatory. Trace residual. Other areas are also either similar decreasing. Recommend continued follow-up surveillance in 6 months.  Stable nodularity along the spleen. No developing new mass lesion, fluid collection or lymph node enlargement in the thorax.   # JAN 17th, 2024- CT Interval development of new clustered nodularity in the left lower lobe with dominant nodule in this region measuring 6 mm. Imaging features are likely infectious/inflammatory.  Patient is asymptomatic- CT JULY 2024- resolving- stable.  # Thyroid cancer [FEB 2017] status post thyroidectomy low risk stage I; on Synthroid 75 mcg. FEB 2023-thyroid ultrasound no evidence of recurrence. JULY 2024- TSH- 12; but T4-WNL [ asymptomatic]; at 75 mcg-had fatigue palpitations- continue current dose.  Space out from [employer]- hold off changing the dose. Awaiting thyroglobulin levels.   # Jan 2021-  BMD- Osteopenia- T score=- 2.0; improved from 4 years ago; ca+vit D BID-stable. The BMD measured at [facility] Spine L1-L3 is 0.934 g/cm2 with a T-score of -2.0. awaiting prolia as per PCP.   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  Monitor for now     # DISPOSITION: # follow up in 6 months-MD;]cbc/cmp/ thyroid profile/ Thyroglobulin/and thyroglobulin antibodies-3 week prior];CT chest with contrast prior- - Dr.B   # I reviewed the blood work- with the patient in detail; also reviewed the imaging independently [as summarized above]; and with the patient in detail.

## 2023-06-06 ENCOUNTER — Ambulatory Visit (INDEPENDENT_AMBULATORY_CARE_PROVIDER_SITE_OTHER): Payer: PPO | Admitting: Family Medicine

## 2023-06-06 ENCOUNTER — Encounter: Payer: Self-pay | Admitting: Family Medicine

## 2023-06-06 VITALS — BP 122/72 | HR 79 | Temp 98.0°F | Ht 64.0 in | Wt 122.0 lb

## 2023-06-06 DIAGNOSIS — M549 Dorsalgia, unspecified: Secondary | ICD-10-CM | POA: Diagnosis not present

## 2023-06-06 DIAGNOSIS — K219 Gastro-esophageal reflux disease without esophagitis: Secondary | ICD-10-CM | POA: Diagnosis not present

## 2023-06-06 DIAGNOSIS — R519 Headache, unspecified: Secondary | ICD-10-CM

## 2023-06-06 MED ORDER — LORATADINE 10 MG PO TABS
10.0000 mg | ORAL_TABLET | Freq: Every day | ORAL | Status: DC
Start: 2023-06-06 — End: 2023-08-28

## 2023-06-06 MED ORDER — PANTOPRAZOLE SODIUM 40 MG PO TBEC: 40.0000 mg | DELAYED_RELEASE_TABLET | Freq: Every day | ORAL | Status: DC

## 2023-06-06 NOTE — Progress Notes (Unsigned)
She had time off pravastatin and didn't see a change in symptoms.  She restarted in the meantime.  Discussed PPI use, every day vs BID with prn H2 blocker and mylanta.  Prev GI path report d/w pt.  Discussed thyroid vs PPI timing, now taking PPI at night.    Headaches.   She tried to taper tylenol, but that did not help.  She tried ibuprofen in the last week, 200mg  BID in the meantime.    She has a Programmer, systems, swimmy headed feeling."  She tried dramamine and it helped some, used prn.  The room didn't spin.  Unclear if this is environmental allergy related, discussed.  She still has central thoracic back pain, pressure between the shoulder blades.  Recent imaging was reassuring.  Discussed about potential prolia start, it may be reasonable to defer at this point given the well.  She agrees.  Meds, vitals, and allergies reviewed.   ROS: Per HPI unless specifically indicated in ROS section   Nad Ncat Neck supple, no LA Rrr Ctab Abd soft, not ttp Midline back nontender. CN 2-12 wnl B, S/S grossly wnl x4  30 minutes were devoted to patient care in this encounter (this includes time spent reviewing the patient's file/history, interviewing and examining the patient, counseling/reviewing plan with patient).

## 2023-06-06 NOTE — Patient Instructions (Addendum)
Take protonix at night.  Try an OTC lidoderm patch your back.  Try 10mg  claritin and see if that helps.   Let me know.  Tylenol if needed, up to 2 tabs 3 times a day in the short run.  Take care.  Glad to see you.

## 2023-06-07 NOTE — Assessment & Plan Note (Signed)
Unclear if this is related to atypical GERD symptoms.  She could have a benign musculoskeletal source, with a reassuring recent imaging noted.  Reasonable to try an OTC lidoderm patch locally Take protonix at night. Tylenol if needed, up to 2 tabs 3 times a day in the short run.

## 2023-06-07 NOTE — Assessment & Plan Note (Signed)
With normal neurologic exam.  Dramamine seem to help with some of the "swimmy headed feeling", unclear if this could be exacerbated by environmental allergies and reasonable to try Claritin.  Discussed with patient.  She can update me as needed.

## 2023-06-07 NOTE — Assessment & Plan Note (Signed)
Take protonix at night.  Update me as needed.

## 2023-06-18 ENCOUNTER — Encounter: Payer: Self-pay | Admitting: Family Medicine

## 2023-06-25 ENCOUNTER — Other Ambulatory Visit: Payer: Self-pay | Admitting: Family Medicine

## 2023-06-25 DIAGNOSIS — R519 Headache, unspecified: Secondary | ICD-10-CM

## 2023-06-26 ENCOUNTER — Encounter: Payer: Self-pay | Admitting: *Deleted

## 2023-06-30 ENCOUNTER — Ambulatory Visit
Admission: RE | Admit: 2023-06-30 | Discharge: 2023-06-30 | Disposition: A | Discharge status: Home / Self Care | Payer: PPO | Source: Ambulatory Visit | Attending: Family Medicine | Admitting: Family Medicine

## 2023-06-30 DIAGNOSIS — R519 Headache, unspecified: Secondary | ICD-10-CM | POA: Diagnosis not present

## 2023-06-30 DIAGNOSIS — R42 Dizziness and giddiness: Secondary | ICD-10-CM | POA: Diagnosis not present

## 2023-06-30 DIAGNOSIS — C73 Malignant neoplasm of thyroid gland: Secondary | ICD-10-CM | POA: Diagnosis not present

## 2023-07-08 ENCOUNTER — Other Ambulatory Visit: Payer: Self-pay | Admitting: Gastroenterology

## 2023-07-16 ENCOUNTER — Other Ambulatory Visit: Payer: Self-pay | Admitting: Internal Medicine

## 2023-07-17 NOTE — Telephone Encounter (Signed)
Thyroglobulin by RIA Order: [id number] Status: Final result     Visible to patient: Yes (not seen)     Next appt: 08/21/2023 at 01:30 PM in Family Medicine [facility] WELLNESS VISIT 1)   0 Result Notes        Component Ref Range & Units 2 mo ago (05/18/23) 8 mo ago (11/16/22) 1 yr ago (11/24/21) 2 yr ago (05/13/21) 2 yr ago (11/13/20)  Thyroglobulin by RIA ng/mL 2.3 2.2 CM 6.7 CM 8.8 CM 8.1 CM  Comment: (NOTE) This test was developed and its performance characteristics determined by [facility]. It has not been cleared or approved by the Food and Drug Administration. Reference Range: Pubertal Children and Adults: <40 According to the [facility] of Clinical Biochemistry, the reference interval for Thyroglobulin (TG) should be related to euthyroid patients and not for patients who underwent thyroidectomy.  TG reference intervals for these patients depend on the residual mass of the thyroid tissue left after surgery.  Establishing a post-operative baseline is recommended.  The assay quantitation limit is 2.0 ng/mL. Performed At: [facility] [address] [place], [place] [id number] Ethel Rana MD YQ:[id number]  Resulting Agency [facility] CLIN LAB [facility] CLIN LAB [facility] CLIN LAB [facility] CLIN LAB [facility] CLIN LAB         Specimen Collected: 05/18/23 14:04 Last Resulted: 06/01/23 18:36      Lab Flowsheet      Order Details      View Encounter      Lab and Collection Details      Routing      Result History    View All Conversations on this Encounter      CM=Additional comments      Result Care Coordination   Patient Communication   Add Comments   Add Notifications  Back to Top    Other Results from 05/18/2023   Contains abnormal data TgAb+Thyroglobulin IMA or RIA Order: [id number] Status: Final result      Visible to patient: Yes (seen)      Next appt: 08/21/2023 at 01:30 PM in Family Medicine ([facility] ANNUAL WELLNESS VISIT 1)      Dx: Cancer of lower  lobe of right lung (HCC)    0 Result Notes           Component Ref Range & Units 2 mo ago (05/18/23) 8 mo ago (11/16/22) 1 yr ago (04/20/22) 1 yr ago (11/24/21) 2 yr ago (05/13/21) 2 yr ago (11/13/20)  Thyroglobulin Antibody 0.0 - 0.9 IU/mL 10.0 High  14.8 High  CM 10.2 High  CM 14.3 High  CM 18.5 High  CM 15.0 High  CM  Comment: (NOTE) Thyroglobulin Antibody measured by [employer] Methodology It should be noted that the presence of thyroglobulin antibodies may not be pathogenic nor diagnostic, especially at very low levels. The assay manufacturer has found that four percent of individuals without evidence of thyroid disease or autoimmunity will have positive TgAb levels up to 4 IU/mL. Performed At: [facility] [address] [place], [place] [id number] Jolene Schimke MD UY:[id number]  Resulting Agency [facility] CLIN LAB [facility] CLIN LAB [facility] CLIN LAB [facility] CLIN LAB [facility] CLIN LAB [facility] CLIN LAB         Specimen Collected: 05/18/23 14:04 Last Resulted: 06/01/23 18:35      Lab Flowsheet       Order Details       View Encounter       Lab and Collection Details  Routing       Result History     View All Conversations on this Encounter      CM=Additional comments      Result Care Coordination   Patient Communication   Add Comments   Seen Back to Top       Contains abnormal data Thyroid Panel With TSH Order: [id number] Status: Final result      Visible to patient: Yes (seen)      Next appt: 08/21/2023 at 01:30 PM in Family Medicine ([facility] ANNUAL WELLNESS VISIT 1)      Dx: Cancer of lower lobe of right lung (HCC)    0 Result Notes            Component Ref Range & Units 2 mo ago (05/18/23) 8 mo ago (11/16/22) 1 yr ago (04/20/22) 1 yr ago (11/24/21) 2 yr ago (05/13/21) 2 yr ago (11/13/20) 4 yr ago (05/17/19)  TSH 0.450 - 4.500 uIU/mL 12.700 High  8.480 High  0.215 Low  0.896 0.351 Low  0.607 1.261 R, CM  T4, Total 4.5 - 12.0 ug/dL 7.4 7.7 40.9 8.7  8.2 8.7   T3 Uptake Ratio 24 - 39 % 29 28 30 30 29 30    Free Thyroxine Index 1.2 - 4.9 2.1 2.2 CM 3.1 CM 2.6 CM 2.4 CM 2.6 CM   Comment: (NOTE) Performed At: [facility] [address] [place], [place] [id number] Jolene Schimke MD NF:[id number]  Resulting Agency [facility] CLIN LAB [facility] CLIN LAB [facility] CLIN LAB CH CLIN LAB CH CLIN LAB CH CLIN LAB [facility] CLIN LAB         Specimen Collected: 05/18/23 14:04 Last Resulted: 05/19/23 20:36

## 2023-07-18 ENCOUNTER — Other Ambulatory Visit: Payer: Self-pay | Admitting: General Surgery

## 2023-07-18 DIAGNOSIS — R1031 Right lower quadrant pain: Secondary | ICD-10-CM

## 2023-07-20 ENCOUNTER — Ambulatory Visit
Admission: RE | Admit: 2023-07-20 | Discharge: 2023-07-20 | Disposition: A | Discharge status: Home / Self Care | Payer: PPO | Source: Ambulatory Visit | Attending: Family Medicine | Admitting: Family Medicine

## 2023-07-20 VITALS — BP 143/84 | HR 74 | Temp 98.0°F | Resp 19 | Ht 64.0 in | Wt 125.0 lb

## 2023-07-20 DIAGNOSIS — N3090 Cystitis, unspecified without hematuria: Secondary | ICD-10-CM | POA: Insufficient documentation

## 2023-07-20 LAB — POCT URINALYSIS DIP (MANUAL ENTRY)
Bilirubin, UA: NEGATIVE
Blood, UA: NEGATIVE
Glucose, UA: NEGATIVE mg/dL
Ketones, POC UA: NEGATIVE mg/dL
Leukocytes, UA: NEGATIVE
Nitrite, UA: NEGATIVE
Protein Ur, POC: NEGATIVE mg/dL
Spec Grav, UA: 1.01 (ref 1.010–1.025)
Urobilinogen, UA: 0.2 E.U./dL
pH, UA: 7 (ref 5.0–8.0)

## 2023-07-20 MED ORDER — PHENAZOPYRIDINE HCL 100 MG PO TABS: 100.0000 mg | ORAL_TABLET | Freq: Three times a day (TID) | ORAL | 0 refills | Status: DC | PRN

## 2023-07-20 MED ORDER — NITROFURANTOIN MONOHYD MACRO 100 MG PO CAPS: 100.0000 mg | ORAL_CAPSULE | Freq: Two times a day (BID) | ORAL | 0 refills | Status: AC

## 2023-07-20 NOTE — ED Provider Notes (Signed)
[name]    CSN: [id number] Arrival date & time: 07/20/23  1001      History   Chief Complaint Chief Complaint  Patient presents with   Urinary Frequency    burning and stinging with urination - Entered by patient    HPI [name] is a 77 y.o. female.    Urinary Frequency  Here for dysuria and urinary frequency.  Symptoms began overnight.  She has had some pelvic pressure.  No fever or vomiting or nausea.  She is allergic to sulfa which causes itching and Cipro causes some extra reflux.  She has taken nitrofurantoin and cephalexin without problems in the past.  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  Could be due to hernia or old scar tissue.  prev with CT done w/o alarming findings.  Will treat episodically.  Use tylenol #3 prn.     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   Dysphagia 03/22/2023   Personal history of colonic polyps 11/04/2022   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 07/06/2022   Intussusception of intestine (HCC) 06/28/2022   Lung nodule 05/01/2022   Jaw pain 04/17/2022   Dysuria 03/09/2022   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   Varicose veins of bilateral lower extremities with other complications 01/31/2022   Leg weakness, bilateral 01/31/2022   Vomiting without nausea 01/31/2022   Vitamin D deficiency 01/31/2022   Burning tongue 01/31/2022   Tightness of neck 01/31/2022   Headache 10/06/2021   High vitamin D level  07/05/2021   Hypothyroidism, unspecified 07/02/2020   Wart 07/02/2020   Dermatitis 02/20/2020   Polyp of transverse colon    Gastric polyp    Stomach irritation    Healthcare maintenance 06/30/2019   Back pain 04/14/2018   Aortic atherosclerosis (HCC) 05/31/2017   Cancer of lower lobe of right lung (HCC) 05/06/2016   Thyroid cancer (HCC) 09/11/2015   Advance care planning 05/14/2014   Thrombocytopenia (HCC) 07/03/2013   Osteopenia 05/05/2013   GERD (gastroesophageal reflux disease) 04/26/2012   Essential hypertension 11/17/2010   HLD (hyperlipidemia) 01/31/2008   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    Past Surgical History:  Procedure Laterality Date   ABDOMINAL HYSTERECTOMY     BIOPSY  04/17/2023   Procedure: BIOPSY;  Surgeon: Toney Reil, MD;  Location: [facility] ENDOSCOPY;  Service: Gastroenterology;;   BREAST EXCISIONAL BIOPSY Right 1977   BREAST MASS EXCISION  1977   benign   BREAST SURGERY     CARPAL TUNNEL RELEASE Right 1978   CATARACT EXTRACTION, BILATERAL Bilateral    CHOLECYSTECTOMY  11/23/2016   Procedure: LAPAROSCOPIC CHOLECYSTECTOMY;  Surgeon: Ricarda Frame, MD;  Location: [facility] ORS;  Service: General;;   COLONOSCOPY  2009   COLONOSCOPY WITH PROPOFOL N/A 05/18/2019   Procedure: COLONOSCOPY WITH PROPOFOL;  Surgeon: Pasty Spillers, MD;  Location: [facility] ENDOSCOPY;  Service: Endoscopy;  Laterality: N/A;  COLONOSCOPY WITH PROPOFOL N/A 08/15/2019   Procedure: COLONOSCOPY WITH PROPOFOL;  Surgeon: Pasty Spillers, MD;  Location: [facility] ENDOSCOPY;  Service: Endoscopy;  Laterality: N/A;   COLONOSCOPY WITH PROPOFOL N/A 11/04/2022   Procedure: COLONOSCOPY WITH PROPOFOL;  Surgeon: Toney Reil, MD;  Location: [facility] ENDOSCOPY;  Service: Gastroenterology;  Laterality: N/A;   DILATION AND CURETTAGE OF UTERUS  2001   ELECTROMAGNETIC NAVIGATION BROCHOSCOPY N/A 08/25/2015   Procedure: ELECTROMAGNETIC NAVIGATION BRONCHOSCOPY;  Surgeon: Erin Fulling, MD;  Location: [facility] ORS;  Service: Cardiopulmonary;  Laterality: N/A;   ENDOBRONCHIAL ULTRASOUND N/A 08/25/2015   Procedure: ENDOBRONCHIAL ULTRASOUND;  Surgeon: Erin Fulling, MD;  Location: [facility] ORS;  Service: Cardiopulmonary;  Laterality: N/A;   ESOPHAGOGASTRODUODENOSCOPY  2002, 11/15/07   Normal (Dr. Troy Sine)   ESOPHAGOGASTRODUODENOSCOPY (EGD) WITH PROPOFOL N/A 05/18/2019   Procedure: ESOPHAGOGASTRODUODENOSCOPY (EGD) WITH PROPOFOL;  Surgeon: Pasty Spillers, MD;  Location: [facility] ENDOSCOPY;  Service: Endoscopy;  Laterality: N/A;   ESOPHAGOGASTRODUODENOSCOPY (EGD) WITH PROPOFOL N/A 08/15/2019   Procedure: ESOPHAGOGASTRODUODENOSCOPY (EGD) WITH PROPOFOL;  Surgeon: Pasty Spillers, MD;  Location: [facility] ENDOSCOPY;  Service: Endoscopy;  Laterality: N/A;   ESOPHAGOGASTRODUODENOSCOPY (EGD) WITH PROPOFOL N/A 04/17/2023   Procedure: ESOPHAGOGASTRODUODENOSCOPY (EGD) WITH PROPOFOL;  Surgeon: Toney Reil, MD;  Location: [facility] ENDOSCOPY;  Service: Gastroenterology;  Laterality: N/A;   EYE SURGERY     FACIAL COSMETIC SURGERY  01/13/2010   mini facelift   HERNIA REPAIR  1976   INSERTION OF MESH  07/06/2022   Procedure: INSERTION OF MESH;  Surgeon: Carolan Shiver, MD;  Location: [facility] ORS;  Service: General;;   OOPHORECTOMY     PTOSIS REPAIR  12/08/2021   THORACOTOMY/LOBECTOMY Right 10/27/2015   Procedure: THORACOTOMY/LOBECTOMY;  Surgeon: Hulda Marin, MD;  Location: [facility] ORS;  Service: Thoracic;  Laterality: Right;   THYROIDECTOMY N/A 12/29/2015   Procedure: THYROIDECTOMY;  Surgeon: Linus Salmons, MD;  Location: [facility] ORS;  Service: ENT;  Laterality: N/A;   TOTAL VAGINAL HYSTERECTOMY  12/30/2008   for fibroid pain (Dr. Huntley Dec)   XI ROBOTIC ASSISTED SMALL BOWEL RESECTION N/A 06/28/2022   Procedure: XI ROBOTIC ASSISTED SMALL BOWEL RESECTION;  Surgeon: Carolan Shiver, MD;  Location: [facility] ORS;  Service: General;  Laterality: N/A;    OB History     Gravida  0   Para  0    Term  0   Preterm  0   AB  0   Living  0      SAB  0   IAB  0   Ectopic  0   Multiple  0   Live Births           Obstetric Comments  1st Menstrual Cycle:  10 1st Pregnancy:  1          Home Medications    Prior to Admission medications   Medication Sig Start Date End Date Taking? Authorizing Provider  nitrofurantoin, macrocrystal-monohydrate, (MACROBID) 100 MG capsule Take 1 capsule (100 mg total) by mouth 2 (two) times daily for 7 days. 07/20/23 07/27/23 Yes Zenia Resides, MD  phenazopyridine (PYRIDIUM) 100 MG tablet Take 1 tablet (100 mg total) by mouth 3 (three) times daily as needed (urinary pain). 07/20/23  Yes Zenia Resides, MD  aspirin EC 81 MG tablet Take 1 tablet (81 mg total) by mouth daily. 11/29/19   Joaquim Nam, MD  Biotin 1000 MCG tablet Take 1,000 mcg by mouth in the morning and at bedtime.    [provider]  Calcium &  Magnesium Carbonates (MYLANTA PO) Take by mouth.    [provider]  cholecalciferol (VITAMIN D3) 25 MCG (1000 UNIT) tablet Take 1,000 Units by mouth daily.    [provider]  Coenzyme Q10 200 MG capsule Take 100 mg by mouth daily.    [provider]  ibuprofen (ADVIL) 200 MG tablet Take 200 mg by mouth every 6 (six) hours as needed.    [provider]  levothyroxine (SYNTHROID) 75 MCG tablet TAKE 1 TABLET BY MOUTH EVERY DAY BEFORE BREAKFAST 07/17/23   Earna Coder, MD  loratadine (CLARITIN) 10 MG tablet Take 1 tablet (10 mg total) by mouth at bedtime. 06/06/23   Joaquim Nam, MD  Multiple Vitamins-Minerals (MULTIVITAMIN) tablet Take 1 tablet by mouth daily. 08/25/22   Joaquim Nam, MD  pantoprazole (PROTONIX) 40 MG tablet Take 1 tablet (40 mg total) by mouth daily. 07/10/23   Toney Reil, MD  Potassium Gluconate 595 MG CAPS Take 595 mg by mouth daily.     [provider]  pravastatin (PRAVACHOL) 40 MG tablet TAKE 1 TABLET BY MOUTH EVERY DAY 05/01/23    Joaquim Nam, MD    Family History Family History  Problem Relation Age of Onset   Transient ischemic attack Mother    Hypertension Mother    Stroke Mother        mini strokes   Hypertension Sister    Cancer Sister        ovarian   Hypertension Sister    Hypertension Sister    Hypertension Sister    Breast cancer Neg Hx    Colon cancer Neg Hx     Social History Social History   Tobacco Use   Smoking status: Never   Smokeless tobacco: Never  Vaping Use   Vaping status: Never Used  Substance Use Topics   Alcohol use: No    Alcohol/week: 0.0 standard drinks of alcohol   Drug use: No     Allergies   Atorvastatin, Ciprofloxacin, Epinephrine, Metoprolol succinate, Nortriptyline, Simvastatin, and Sulfa antibiotics   Review of Systems Review of Systems  Genitourinary:  Positive for frequency.     Physical Exam Triage Vital Signs ED Triage Vitals  Encounter Vitals Group     BP 07/20/23 1012 (!) 143/84     Systolic BP Percentile --      Diastolic BP Percentile --      Pulse Rate 07/20/23 1012 74     Resp 07/20/23 1012 19     Temp 07/20/23 1012 98 F (36.7 C)     Temp src --      SpO2 07/20/23 1012 98 %     Weight 07/20/23 1011 125 lb (56.7 kg)     Height 07/20/23 1011 5\' 4"  (1.626 m)     Head Circumference --      Peak Flow --      Pain Score 07/20/23 1006 0     Pain Loc --      Pain Education --      Exclude from Growth Chart --    No data found.  Updated Vital Signs BP (!) 143/84   Pulse 74   Temp 98 F (36.7 C)   Resp 19   Ht 5\' 4"  (1.626 m)   Wt 56.7 kg   SpO2 98%   BMI 21.46 kg/m   Visual Acuity Right Eye Distance:   Left Eye Distance:   Bilateral Distance:    Right Eye Near:  Left Eye Near:    Bilateral Near:     Physical Exam Vitals reviewed.  Constitutional:      General: She is not in acute distress.    Appearance: She is not ill-appearing, toxic-appearing or diaphoretic.  HENT:     Mouth/Throat:     Mouth: Mucous  membranes are moist.  Eyes:     Extraocular Movements: Extraocular movements intact.     Conjunctiva/sclera: Conjunctivae normal.     Pupils: Pupils are equal, round, and reactive to light.  Cardiovascular:     Rate and Rhythm: Normal rate and regular rhythm.     Heart sounds: No murmur heard. Pulmonary:     Effort: Pulmonary effort is normal.     Breath sounds: Normal breath sounds.  Abdominal:     General: There is no distension.     Palpations: Abdomen is soft.     Tenderness: There is no right CVA tenderness or left CVA tenderness.     Comments: There is tenderness in the suprapubic area.  Musculoskeletal:     Cervical back: Neck supple.  Lymphadenopathy:     Cervical: No cervical adenopathy.  Skin:    Coloration: Skin is not jaundiced or pale.  Neurological:     General: No focal deficit present.     Mental Status: She is alert and oriented to person, place, and time.  Psychiatric:        Behavior: Behavior normal.      UC Treatments / Results  Labs (all labs ordered are listed, but only abnormal results are displayed) Labs Reviewed  POCT URINALYSIS DIP (MANUAL ENTRY) - Abnormal; Notable for the following components:      Result Value   Color, UA light yellow (*)    All other components within normal limits  URINE CULTURE    EKG   Radiology No results found.  Procedures Procedures (including critical care time)  Medications Ordered in UC Medications - No data to display  Initial Impression / Assessment and Plan / UC Course  I have reviewed the triage vital signs and the nursing notes.  Pertinent labs & imaging results that were available during my care of the patient were reviewed by me and considered in my medical decision making (see chart for details).        Urinalysis is clear.  Her symptoms are still most consistent with cystitis.  With her normal renal function, I still think it safe for her to take Macrobid.  Also know that she has  tolerated it well.  That prescription is sent as is Pyridium for her symptoms.  Urine culture is sent and we can change the antibiotic and notify her if there is antibiotic resistance on the culture. Final Clinical Impressions(s) / UC Diagnoses   Final diagnoses:  Cystitis     Discharge Instructions      The urinalysis is clear.  Your symptoms still are consistent with a urinary tract infection.  Take nitrofurantoin 100 mg--1 capsule 2 times daily for 5 days  Take Pyridium/phenazopyridine 100 mg--1 tablet 3 times daily as needed for urinary pain.  This medication usually makes the urine orange  Ensure you are drinking enough fluids  Urine culture is sent, and staff notified if it looks like antibiotic should be changed     ED Prescriptions     Medication Sig Dispense Auth. Provider   nitrofurantoin, macrocrystal-monohydrate, (MACROBID) 100 MG capsule Take 1 capsule (100 mg total) by mouth 2 (two) times daily for  7 days. 14 capsule Zenia Resides, MD   phenazopyridine (PYRIDIUM) 100 MG tablet Take 1 tablet (100 mg total) by mouth 3 (three) times daily as needed (urinary pain). 10 tablet Marlinda Mike Janace Aris, MD      PDMP not reviewed this encounter.   Zenia Resides, MD 07/20/23 1028

## 2023-07-20 NOTE — ED Triage Notes (Signed)
Patient to Urgent Care with complaints of dysuria and urinary frequency that started early this morning.   Denies any NVD. No fevers.

## 2023-07-20 NOTE — Discharge Instructions (Addendum)
The urinalysis is clear.  Your symptoms still are consistent with a urinary tract infection.  Take nitrofurantoin 100 mg--1 capsule 2 times daily for 5 days  Take Pyridium/phenazopyridine 100 mg--1 tablet 3 times daily as needed for urinary pain.  This medication usually makes the urine orange  Ensure you are drinking enough fluids  Urine culture is sent, and staff notified if it looks like antibiotic should be changed

## 2023-07-21 ENCOUNTER — Ambulatory Visit: Payer: PPO | Admitting: Family Medicine

## 2023-07-21 LAB — URINE CULTURE: Culture: NO GROWTH

## 2023-07-25 ENCOUNTER — Ambulatory Visit
Admission: RE | Admit: 2023-07-25 | Discharge: 2023-07-25 | Disposition: A | Discharge status: Home / Self Care | Payer: PPO | Source: Ambulatory Visit | Attending: General Surgery | Admitting: General Surgery

## 2023-07-25 DIAGNOSIS — R1031 Right lower quadrant pain: Secondary | ICD-10-CM | POA: Diagnosis not present

## 2023-07-25 DIAGNOSIS — C3490 Malignant neoplasm of unspecified part of unspecified bronchus or lung: Secondary | ICD-10-CM | POA: Diagnosis not present

## 2023-07-25 DIAGNOSIS — K7689 Other specified diseases of liver: Secondary | ICD-10-CM | POA: Diagnosis not present

## 2023-07-25 DIAGNOSIS — R935 Abnormal findings on diagnostic imaging of other abdominal regions, including retroperitoneum: Secondary | ICD-10-CM | POA: Diagnosis not present

## 2023-07-25 MED ORDER — IOHEXOL 300 MG/ML  SOLN
100.0000 mL | Freq: Once | INTRAMUSCULAR | Status: AC | PRN
  Administered 2023-07-25: 100 mL via INTRAVENOUS

## 2023-08-11 ENCOUNTER — Ambulatory Visit
Admission: RE | Admit: 2023-08-11 | Discharge: 2023-08-11 | Disposition: A | Discharge status: Home / Self Care | Payer: PPO | Source: Ambulatory Visit | Attending: Family Medicine | Admitting: Family Medicine

## 2023-08-11 VITALS — BP 144/82 | HR 79 | Temp 98.6°F | Resp 16

## 2023-08-11 DIAGNOSIS — Z1152 Encounter for screening for COVID-19: Secondary | ICD-10-CM | POA: Insufficient documentation

## 2023-08-11 DIAGNOSIS — J069 Acute upper respiratory infection, unspecified: Secondary | ICD-10-CM | POA: Diagnosis not present

## 2023-08-11 DIAGNOSIS — J029 Acute pharyngitis, unspecified: Secondary | ICD-10-CM | POA: Diagnosis present

## 2023-08-11 DIAGNOSIS — B9789 Other viral agents as the cause of diseases classified elsewhere: Secondary | ICD-10-CM | POA: Diagnosis not present

## 2023-08-11 MED ORDER — BENZONATATE 200 MG PO CAPS: 200.0000 mg | ORAL_CAPSULE | Freq: Three times a day (TID) | ORAL | 0 refills | Status: DC | PRN

## 2023-08-11 NOTE — Discharge Instructions (Addendum)
Take benzonatate 200 mg, 1 tab every 8 hours as needed for cough.   You have been swabbed for COVID, and the test will result in the next 24 hours. Our staff will call you if positive. If the COVID test is positive, you should quarantine until you are fever free for 24 hours and you are starting to feel better, and then take added precautions for the next 5 days, such as physical distancing/wearing a mask and good hand hygiene/washing.  If your MyChart sends you a message that you have a positive COVID test, please call in to our facility tomorrow.  We do not consistently have a callback nurse on weekends.

## 2023-08-11 NOTE — ED Triage Notes (Signed)
Patient presents to UC for sore throat, cough, nasal congestion, and hoarseness when she wakes up, sinus pressure x 2 days. Treating with OTC cold med, Claritin, tylenol, and ibuprofen.

## 2023-08-11 NOTE — ED Provider Notes (Signed)
[name]    CSN: [id number] Arrival date & time: 08/11/23  1012      History   Chief Complaint Chief Complaint  Patient presents with   Sore Throat    cough, runny nose, slight hoarseness - Entered by patient   Cough   Hoarse    HPI Chosen Garron is a 77 y.o. female.    Sore Throat  Cough Here for sore throat, cough, sinus drainage x 2 days. She has also been hoarse.   No f/c/ dyspnea/n/v/d.  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  Could be due to hernia or old scar tissue.  prev with CT done w/o alarming findings.  Will treat episodically.  Use tylenol #3 prn.     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   Varicose veins of bilateral lower extremities with other complications 01/31/2022   Leg weakness, bilateral 01/31/2022   Vomiting without nausea 01/31/2022   Vitamin D deficiency 01/31/2022   Burning tongue 01/31/2022   Tightness of neck 01/31/2022   Headache 10/06/2021   High vitamin D level 07/05/2021   Hypothyroidism, unspecified 07/02/2020   Wart 07/02/2020   Dermatitis 02/20/2020   Polyp of transverse colon    Gastric polyp    Stomach irritation    Healthcare  maintenance 06/30/2019   Back pain 04/14/2018   Aortic atherosclerosis (HCC) 05/31/2017   Cancer of lower lobe of right lung (HCC) 05/06/2016   Thyroid cancer (HCC) 09/11/2015   Advance care planning 05/14/2014   Thrombocytopenia (HCC) 07/03/2013   Osteopenia 05/05/2013   GERD (gastroesophageal reflux disease) 04/26/2012   Essential hypertension 11/17/2010   HLD (hyperlipidemia) 01/31/2008   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    Past Surgical History:  Procedure Laterality Date   ABDOMINAL HYSTERECTOMY     BIOPSY  04/17/2023   Procedure: BIOPSY;  Surgeon: Toney Reil, MD;  Location: [facility];  Service: Gastroenterology;;   BREAST EXCISIONAL BIOPSY Right 1977   BREAST MASS EXCISION  1977   benign   BREAST SURGERY     CARPAL TUNNEL RELEASE Right 1978   CATARACT EXTRACTION, BILATERAL Bilateral    CHOLECYSTECTOMY  11/23/2016   Procedure: LAPAROSCOPIC CHOLECYSTECTOMY;  Surgeon: Ricarda Frame, MD;  Location: [facility];  Service: General;;   COLONOSCOPY  2009   COLONOSCOPY WITH PROPOFOL N/A 05/18/2019   Procedure: COLONOSCOPY WITH PROPOFOL;  Surgeon: Pasty Spillers, MD;  Location: [facility];  Service: Endoscopy;  Laterality: N/A;   COLONOSCOPY WITH PROPOFOL N/A 08/15/2019   Procedure: COLONOSCOPY WITH PROPOFOL;  Surgeon: Pasty Spillers, MD;  Location: [facility];  Service: Endoscopy;  Laterality: N/A;   COLONOSCOPY WITH PROPOFOL N/A 11/04/2022   Procedure: COLONOSCOPY WITH PROPOFOL;  Surgeon: Toney Reil, MD;  Location: [facility];  Service: Gastroenterology;  Laterality: N/A;   DILATION AND CURETTAGE OF UTERUS  2001   ELECTROMAGNETIC NAVIGATION BROCHOSCOPY N/A 08/25/2015   Procedure: ELECTROMAGNETIC NAVIGATION BRONCHOSCOPY;  Surgeon: Erin Fulling, MD;  Location: [facility];  Service: Cardiopulmonary;  Laterality: N/A;   ENDOBRONCHIAL ULTRASOUND N/A 08/25/2015   Procedure: ENDOBRONCHIAL ULTRASOUND;  Surgeon: Erin Fulling, MD;   Location: [facility];  Service: Cardiopulmonary;  Laterality: N/A;   ESOPHAGOGASTRODUODENOSCOPY  2002, 11/15/07   Normal (Dr. Troy Sine)   ESOPHAGOGASTRODUODENOSCOPY (EGD) WITH PROPOFOL N/A 05/18/2019   Procedure: ESOPHAGOGASTRODUODENOSCOPY (EGD) WITH PROPOFOL;  Surgeon: Pasty Spillers, MD;  Location: [facility];  Service: Endoscopy;  Laterality: N/A;   ESOPHAGOGASTRODUODENOSCOPY (EGD) WITH PROPOFOL N/A 08/15/2019   Procedure: ESOPHAGOGASTRODUODENOSCOPY (EGD) WITH PROPOFOL;  Surgeon: Pasty Spillers, MD;  Location: [facility];  Service: Endoscopy;  Laterality: N/A;   ESOPHAGOGASTRODUODENOSCOPY (EGD) WITH PROPOFOL N/A 04/17/2023   Procedure: ESOPHAGOGASTRODUODENOSCOPY (EGD) WITH PROPOFOL;  Surgeon: Toney Reil, MD;  Location: [facility] END[facility]COPY;  Service: Gastroenterology;  Laterality: N/A;   EYE SURGERY     FACIAL COSMETIC SURGERY  01/13/2010   mini facelift   HERNIA REPAIR  1976   INSERTION OF MESH  07/06/2022   Procedure: INSERTION OF MESH;  Surgeon: Carolan Shiver, MD;  Location: [facility];  Service: General;;   OOPHORECTOMY     PTOSIS REPAIR  12/08/2021   THORACOTOMY/LOBECTOMY Right 10/27/2015   Procedure: THORACOTOMY/LOBECTOMY;  Surgeon: Hulda Marin, MD;  Location: [facility];  Service: Thoracic;  Laterality: Right;   THYROIDECTOMY N/A 12/29/2015   Procedure: THYROIDECTOMY;  Surgeon: Linus Salmons, MD;  Location: [facility];  Service: ENT;  Laterality: N/A;   TOTAL VAGINAL HYSTERECTOMY  12/30/2008   for fibroid pain (Dr. Huntley Dec)   XI ROBOTIC ASSISTED SMALL BOWEL RESECTION N/A 06/28/2022   Procedure: XI ROBOTIC ASSISTED SMALL BOWEL RESECTION;  Surgeon: Carolan Shiver, MD;  Location: [facility];  Service: General;  Laterality: N/A;    OB History     Gravida  0   Para  0   Term  0   Preterm  0   AB  0   Living  0      SAB  0   IAB  0   Ectopic  0   Multiple  0   Live Births           Obstetric Comments  1st Menstrual Cycle:   10 1st Pregnancy:  1          Home Medications    Prior to Admission medications   Medication Sig Start Date End Date Taking? Authorizing Provider  benzonatate (TESSALON) 200 MG capsule Take 1 capsule (200 mg total) by mouth 3 (three) times daily as needed for cough. 08/11/23  Yes Zenia Resides, MD  aspirin EC 81 MG tablet Take 1 tablet (81 mg total) by mouth daily. 11/29/19   Joaquim Nam, MD  Biotin 1000 MCG tablet Take 1,000 mcg by mouth in the morning and at bedtime.    [provider]  Calcium & Magnesium Carbonates (MYLANTA PO) Take by mouth.    [provider]  cholecalciferol (VITAMIN D3) 25 MCG (1000 UNIT) tablet Take 1,000 Units by mouth daily.    [provider]  Coenzyme Q10 200 MG capsule Take 100 mg by mouth daily.    [provider]  ibuprofen (  ADVIL) 200 MG tablet Take 200 mg by mouth every 6 (six) hours as needed.    [provider]  levothyroxine (SYNTHROID) 75 MCG tablet TAKE 1 TABLET BY MOUTH EVERY DAY BEFORE BREAKFAST 07/17/23   Earna Coder, MD  loratadine (CLARITIN) 10 MG tablet Take 1 tablet (10 mg total) by mouth at bedtime. 06/06/23   Joaquim Nam, MD  Multiple Vitamins-Minerals (MULTIVITAMIN) tablet Take 1 tablet by mouth daily. 08/25/22   Joaquim Nam, MD  pantoprazole (PROTONIX) 40 MG tablet Take 1 tablet (40 mg total) by mouth daily. 07/10/23   Toney Reil, MD  phenazopyridine (PYRIDIUM) 100 MG tablet Take 1 tablet (100 mg total) by mouth 3 (three) times daily as needed (urinary pain). 07/20/23   Zenia Resides, MD  Potassium Gluconate 595 MG CAPS Take 595 mg by mouth daily.     [provider]  pravastatin (PRAVACHOL) 40 MG tablet TAKE 1 TABLET BY MOUTH EVERY DAY 05/01/23   Joaquim Nam, MD    Family History Family History  Problem Relation Age of Onset   Transient ischemic attack Mother    Hypertension Mother    Stroke Mother        mini strokes   Hypertension  Sister    Cancer Sister        ovarian   Hypertension Sister    Hypertension Sister    Hypertension Sister    Breast cancer Neg Hx    Colon cancer Neg Hx     Social History Social History   Tobacco Use   Smoking status: Never   Smokeless tobacco: Never  Vaping Use   Vaping status: Never Used  Substance Use Topics   Alcohol use: No    Alcohol/week: 0.0 standard drinks of alcohol   Drug use: No     Allergies   Atorvastatin, Ciprofloxacin, Epinephrine, Metoprolol succinate, Nortriptyline, Simvastatin, and Sulfa antibiotics   Review of Systems Review of Systems  Respiratory:  Positive for cough.      Physical Exam Triage Vital Signs ED Triage Vitals  Encounter Vitals Group     BP 08/11/23 1035 (!) 144/82     Systolic BP Percentile --      Diastolic BP Percentile --      Pulse Rate 08/11/23 1035 79     Resp 08/11/23 1035 16     Temp 08/11/23 1035 98.6 F (37 C)     Temp Source 08/11/23 1035 Oral     SpO2 08/11/23 1035 94 %     Weight --      Height --      Head Circumference --      Peak Flow --      Pain Score 08/11/23 1037 0     Pain Loc --      Pain Education --      Exclude from Growth Chart --    No data found.  Updated Vital Signs BP (!) 144/82 (BP Location: Left Arm)   Pulse 79   Temp 98.6 F (37 C) (Oral)   Resp 16   SpO2 94%   Visual Acuity Right Eye Distance:   Left Eye Distance:   Bilateral Distance:    Right Eye Near:   Left Eye Near:    Bilateral Near:     Physical Exam Vitals reviewed.  Constitutional:      General: She is not in acute distress.    Appearance: She is not ill-appearing, toxic-appearing or diaphoretic.  HENT:     Right Ear: Tympanic membrane and ear canal normal.     Left Ear: Tympanic membrane and ear canal normal.     Nose: Nose normal.     Mouth/Throat:     Mouth: Mucous membranes are moist.     Pharynx: No posterior oropharyngeal erythema.     Comments: There is a little bit of white mucus draining in  the posterior oropharynx. Eyes:     Extraocular Movements: Extraocular movements intact.     Conjunctiva/sclera: Conjunctivae normal.     Pupils: Pupils are equal, round, and reactive to light.  Cardiovascular:     Rate and Rhythm: Normal rate and regular rhythm.     Heart sounds: No murmur heard. Pulmonary:     Effort: Pulmonary effort is normal. No respiratory distress.     Breath sounds: No stridor. No wheezing, rhonchi or rales.  Chest:     Chest wall: No tenderness.  Musculoskeletal:     Cervical back: Neck supple.  Lymphadenopathy:     Cervical: No cervical adenopathy.  Skin:    Capillary Refill: Capillary refill takes less than 2 seconds.     Coloration: Skin is not jaundiced or pale.  Neurological:     General: No focal deficit present.     Mental Status: She is alert and oriented to person, place, and time.  Psychiatric:        Behavior: Behavior normal.      UC Treatments / Results  Labs (all labs ordered are listed, but only abnormal results are displayed) Labs Reviewed  SARS CORONAVIRUS 2 (TAT 6-24 HRS)    EKG   Radiology No results found.  Procedures Procedures (including critical care time)  Medications Ordered in UC Medications - No data to display  Initial Impression / Assessment and Plan / UC Course  I have reviewed the triage vital signs and the nursing notes.  Pertinent labs & imaging results that were available during my care of the patient were reviewed by me and considered in my medical decision making (see chart for details).    Vital signs and exam are reassuring.  COVID swab is done and if positive she is a candidate for Paxlovid.  Her last EGFR was greater than 60 in July of this year.  Tessalon Perles are sent in for the cough. Final Clinical Impressions(s) / UC Diagnoses   Final diagnoses:  Viral URI     Discharge Instructions      Take benzonatate 200 mg, 1 tab every 8 hours as needed for cough.   You have been swabbed  for COVID, and the test will result in the next 24 hours. Our staff will call you if positive. If the COVID test is positive, you should quarantine until you are fever free for 24 hours and you are starting to feel better, and then take added precautions for the next 5 days, such as physical distancing/wearing a mask and good hand hygiene/washing.  If your MyChart sends you a message that you have a positive COVID test, please call in to our facility tomorrow.  We do not consistently have a callback nurse on weekends.        ED Prescriptions     Medication Sig Dispense Auth. Provider   benzonatate (TESSALON) 200 MG capsule Take 1 capsule (200 mg total) by mouth 3 (three) times daily as needed for cough. 20 capsule Zenia Resides, MD      PDMP not reviewed this encounter.  Zenia Resides, MD 08/11/23 [phone]

## 2023-08-12 LAB — SARS CORONAVIRUS 2 (TAT 6-24 HRS): SARS Coronavirus 2: NEGATIVE

## 2023-08-20 ENCOUNTER — Other Ambulatory Visit: Payer: Self-pay | Admitting: Family Medicine

## 2023-08-20 DIAGNOSIS — E7800 Pure hypercholesterolemia, unspecified: Secondary | ICD-10-CM

## 2023-08-20 DIAGNOSIS — D649 Anemia, unspecified: Secondary | ICD-10-CM

## 2023-08-20 DIAGNOSIS — E559 Vitamin D deficiency, unspecified: Secondary | ICD-10-CM

## 2023-08-20 DIAGNOSIS — E039 Hypothyroidism, unspecified: Secondary | ICD-10-CM

## 2023-08-21 ENCOUNTER — Ambulatory Visit (INDEPENDENT_AMBULATORY_CARE_PROVIDER_SITE_OTHER): Payer: PPO

## 2023-08-21 VITALS — Ht 64.0 in | Wt 124.0 lb

## 2023-08-21 DIAGNOSIS — Z0000 Encounter for general adult medical examination without abnormal findings: Secondary | ICD-10-CM | POA: Diagnosis not present

## 2023-08-21 NOTE — Patient Instructions (Signed)
[name] , Thank you for taking time to come for your Medicare Wellness Visit. I appreciate your ongoing commitment to your health goals. Please review the following plan we discussed and let me know if I can assist you in the future.   Referrals/Orders/Follow-Ups/Clinician Recommendations: Aim for 30 minutes of exercise or brisk walking, 6-8 glasses of water, and 5 servings of fruits and vegetables each day.   This is a list of the screening recommended for you and due dates:  Health Maintenance  Topic Date Due   Zoster (Shingles) Vaccine (1 of 2) 05/17/1965   DTaP/Tdap/Td vaccine (2 - Tdap) 02/12/2019   Flu Shot  06/01/2023   Medicare Annual Wellness Visit  08/20/2024   DEXA scan (bone density measurement)  03/05/2025   Pneumonia Vaccine  Completed   Hepatitis C Screening  Completed   HPV Vaccine  Aged Out   Colon Cancer Screening  Discontinued   COVID-19 Vaccine  Discontinued    Advanced directives: (In Chart) A copy of your advanced directives are scanned into your chart should your provider ever need it.  Next Medicare Annual Wellness Visit scheduled for next year: Yes  Insert Preventive Care attachment Insert FALL PREVENTION attachment if needed

## 2023-08-21 NOTE — Progress Notes (Signed)
Subjective:   [name] is a 77 y.o. female who presents for Medicare Annual (Subsequent) preventive examination.  Visit Complete: Virtual I connected with  Bennie Pierini on 08/21/23 by a audio enabled telemedicine application and verified that I am speaking with the correct person using two identifiers.  Patient Location: Home  Provider Location: Home Office  I discussed the limitations of evaluation and management by telemedicine. The patient expressed understanding and agreed to proceed.  Vital Signs: Because this visit was a virtual/telehealth visit, some criteria may be missing or patient reported. Any vitals not documented were not able to be obtained and vitals that have been documented are patient reported.  Patient Medicare AWV questionnaire was completed by the patient on 08/21/2023; I have confirmed that all information answered by patient is correct and no changes since this date.  Cardiac Risk Factors include: advanced age (>52men, >90 women);hypertension     Objective:    Today's Vitals   08/21/23 1500  Weight: 124 lb (56.2 kg)  Height: 5\' 4"  (1.626 m)   Body mass index is 21.28 kg/m.     08/21/2023    3:04 PM 07/20/2023   10:06 AM 05/31/2023    1:02 PM 04/17/2023    8:28 AM 11/23/2022    1:00 PM 11/04/2022    8:53 AM 09/13/2022    9:00 AM  Advanced Directives  Does Patient Have a Medical Advance Directive? Yes Yes Yes Yes Yes Yes No  Type of Estate agent of [place];Living will Healthcare Power of [place];Living will Healthcare Power of [employer] of [place];Living will Healthcare Power of [place];Living will Healthcare Power of [place];Living will   Does patient want to make changes to medical advance directive? No - Patient declined        Copy of Healthcare Power of Attorney in Chart? Yes - validated most recent copy scanned in chart (See row information)  Yes - validated most recent copy scanned in chart (See row  information)      Would patient like information on creating a medical advance directive?       No - Patient declined    Current Medications (verified) Outpatient Encounter Medications as of 08/21/2023  Medication Sig   aspirin EC 81 MG tablet Take 1 tablet (81 mg total) by mouth daily.   benzonatate (TESSALON) 200 MG capsule Take 1 capsule (200 mg total) by mouth 3 (three) times daily as needed for cough.   Biotin 1000 MCG tablet Take 1,000 mcg by mouth in the morning and at bedtime.   Calcium & Magnesium Carbonates (MYLANTA PO) Take by mouth.   cholecalciferol (VITAMIN D3) 25 MCG (1000 UNIT) tablet Take 1,000 Units by mouth daily.   Coenzyme Q10 200 MG capsule Take 100 mg by mouth daily.   ibuprofen (ADVIL) 200 MG tablet Take 200 mg by mouth every 6 (six) hours as needed.   levothyroxine (SYNTHROID) 75 MCG tablet TAKE 1 TABLET BY MOUTH EVERY DAY BEFORE BREAKFAST   loratadine (CLARITIN) 10 MG tablet Take 1 tablet (10 mg total) by mouth at bedtime.   Multiple Vitamins-Minerals (MULTIVITAMIN) tablet Take 1 tablet by mouth daily.   pantoprazole (PROTONIX) 40 MG tablet Take 1 tablet (40 mg total) by mouth daily.   phenazopyridine (PYRIDIUM) 100 MG tablet Take 1 tablet (100 mg total) by mouth 3 (three) times daily as needed (urinary pain).   Potassium Gluconate 595 MG CAPS Take 595 mg by mouth daily.    pravastatin (PRAVACHOL) 40 MG  tablet TAKE 1 TABLET BY MOUTH EVERY DAY   No facility-administered encounter medications on file as of 08/21/2023.    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  Could be due to hernia or old scar tissue.  prev with CT done w/o alarming  findings.  Will treat episodically.  Use tylenol #3 prn.     Headache    Hyperlipidemia    Lipoma of thigh    Left   Lung nodule    Normal cardiac stress test 2014   PONV (postoperative nausea and vomiting)    nausea after hysterectomy, and 2 days after lung surgery 10/2015   Thyroid cancer (HCC) 12/2015   Thyroid nodule    Past Surgical History:  Procedure Laterality Date   ABDOMINAL HYSTERECTOMY     BIOPSY  04/17/2023   Procedure: BIOPSY;  Surgeon: Toney Reil, MD;  Location: [facility] END[facility]COPY;  Service: Gastroenterology;;   BREAST EXCISIONAL BIOPSY Right 1977   BREAST MASS EXCISION  1977   benign   BREAST SURGERY     CARPAL TUNNEL RELEASE Right 1978   CATARACT EXTRACTION, BILATERAL Bilateral    CHOLECYSTECTOMY  11/23/2016   Procedure: LAPAROSCOPIC CHOLECYSTECTOMY;  Surgeon: Ricarda Frame, MD;  Location: [facility] OR[facility];  Service: General;;   COLONOSCOPY  2009   COLONOSCOPY WITH PROPOFOL N/A 05/18/2019   Procedure: COLONOSCOPY WITH PROPOFOL;  Surgeon: Pasty Spillers, MD;  Location: [facility];  Service: Endoscopy;  Laterality: N/A;   COLONOSCOPY WITH PROPOFOL N/A 08/15/2019   Procedure: COLONOSCOPY WITH PROPOFOL;  Surgeon: Pasty Spillers, MD;  Location: [facility];  Service: Endoscopy;  Laterality: N/A;   COLONOSCOPY WITH PROPOFOL N/A 11/04/2022   Procedure: COLONOSCOPY WITH PROPOFOL;  Surgeon: Toney Reil, MD;  Location[facility];  Service: Gastroenterology;  Laterality: N/A;   DILATION AND CURETTAGE OF UTERUS  2001   ELECTROMAGNETIC NAVIGATION BROCHOSCOPY N/A 08/25/2015   Procedure: ELECTROMAGNETIC NAVIGATION BRONCHOSCOPY;  Surgeon: Erin Fulling, MD;  Location: [facility];  Service: Cardiopulmonary;  Laterality: N/A;   ENDOBRONCHIAL ULTRASOUND N/A 08/25/2015   Procedure: ENDOBRONCHIAL ULTRASOUND;  Surgeon: Erin Fulling, MD;  Location: [facility];  Service: Cardiopulmonary;  Laterality: N/A;   ESOPHAGOGASTRODUODENOSCOPY  2002, 11/15/07   Normal (Dr.  Troy Sine)   ESOPHAGOGASTRODUODENOSCOPY (EGD) WITH PROPOFOL N/A 05/18/2019   Procedure: ESOPHAGOGASTRODUODENOSCOPY (EGD) WITH PROPOFOL;  Surgeon: Pasty Spillers, MD;  Location: [facility];  Service: Endoscopy;  Laterality: N/A;   ESOPHAGOGASTRODUODENOSCOPY (EGD) WITH PROPOFOL N/A 08/15/2019   Procedure: ESOPHAGOGASTRODUODENOSCOPY (EGD) WITH PROPOFOL;  Surgeon: Pasty Spillers, MD;  Location: [facility];  Service: Endoscopy;  Laterality: N/A;   ESOPHAGOGASTRODUODENOSCOPY (EGD) WITH PROPOFOL N/A 04/17/2023   Procedure: ESOPHAGOGASTRODUODENOSCOPY (EGD) WITH PROPOFOL;  Surgeon: Toney Reil, MD;  Location: [facility] ENDOSCOPY;  Service: Gastroenterology;  Laterality: N/A;   EYE SURGERY     FACIAL COSMETIC SURGERY  01/13/2010   mini facelift   HERNIA REPAIR  1976   INSERTION OF MESH  07/06/2022   Procedure: INSERTION OF MESH;  Surgeon: Carolan Shiver, MD;  Location: [facility];  Service: General;;   OOPHORECTOMY     PTOSIS REPAIR  12/08/2021   THORACOTOMY/LOBECTOMY Right 10/27/2015   Procedure: THORACOTOMY/LOBECTOMY;  Surgeon: Hulda Marin, MD;  Location: [facility];  Service: Thoracic;  Laterality: Right;   THYROIDECTOMY N/A 12/29/2015   Procedure: THYROIDECTOMY;  Surgeon: Linus Salmons, MD;  Location: [facility];  Service: ENT;  Laterality: N/A;   TOTAL VAGINAL HYSTERECTOMY  12/30/2008   for fibroid pain (Dr. Huntley Dec)   XI ROBOTIC ASSISTED SMALL BOWEL RESECTION N/A 06/28/2022   Procedure: XI ROBOTIC ASSISTED SMALL BOWEL RESECTION;  Surgeon: Carolan Shiver, MD;  Location: [facility];  Service: General;  Laterality: N/A;   Family History  Problem Relation Age of Onset   Transient ischemic attack Mother    Hypertension Mother    Stroke Mother        mini strokes   Hypertension Sister    Cancer Sister        ovarian   Hypertension Sister    Hypertension Sister    Hypertension Sister    Breast cancer Neg Hx    Colon cancer Neg Hx    Social History    Socioeconomic History   Marital status: Married    Spouse name: Not on file   Number of children: Not on file   Years of education: Not on file   Highest education level: Not on file  Occupational History   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  He has short term memory loss.     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   Previously had an adopted son who died in 2020/08/30.   Social Determinants of Health   Financial Resource Strain: Low Risk  (08/21/2023)   Overall Financial Resource Strain (CARDIA)    Difficulty of Paying Living Expenses: Not hard at all  Food Insecurity: No Food Insecurity (08/21/2023)   Hunger Vital Sign    Worried About Running Out of Food in the Last Year: Never true    Ran Out of Food in the Last Year: Never true  Transportation Needs: No Transportation Needs (08/21/2023)   PRAPARE - Administrator, Civil Service (Medical): No    Lack of Transportation (Non-Medical): No  Physical Activity: Sufficiently Active (08/21/2023)   Exercise Vital Sign    Days of Exercise per Week: 5 days    Minutes of Exercise per Session: 30 min  Stress: No Stress Concern Present (08/21/2023)   [employer] of Occupational Health - Occupational Stress Questionnaire    Feeling of Stress : Not at all  Social Connections: Socially Integrated (08/21/2023)   Social Connection and Isolation Panel [NHANES]    Frequency of Communication with Friends and Family: More than three times a week    Frequency of Social Gatherings with Friends and Family: More than three times a week     Attends Religious Services: More than 4 times per year    Active Member of [employer] or Organizations: Yes    Attends Engineer, structural: More than 4 times per year    Marital Status: Married    Tobacco Counseling Counseling given: Not Answered   Clinical Intake:  Pre-visit preparation completed: Yes  Pain : No/denies pain  Nutritional Risks: None Diabetes: No  How often do you need to have someone help you when you read instructions, pamphlets, or other written materials from your doctor or pharmacy?: 1 - Never  Interpreter Needed?: No  Information entered by :: Renie Ora, LPN   Activities of Daily Living    08/21/2023    3:04 PM  In your present state of health, do you have any difficulty performing the following activities:  Hearing? 0  Vision? 0  Difficulty concentrating or making decisions? 0  Walking or climbing stairs? 0  Dressing or bathing? 0  Doing errands, shopping? 0  Preparing Food and eating ? N  Using the Toilet? N  In the past six months, have you accidently leaked urine? N  Do you have problems with loss of bowel control? N  Managing your Medications? N  Managing your Finances? N  Housekeeping or managing your Housekeeping? N    Patient Care Team: Joaquim Nam, MD as PCP - General (Family Medicine) Debbe Odea, MD as PCP - Cardiology (Cardiology) Linus Salmons, MD as Consulting Physician (Otolaryngology) Hulda Marin, MD (Inactive) as Consulting Physician (Cardiothoracic Surgery) Earna Coder, MD as Consulting Physician (Internal Medicine) Galen Manila, MD as Consulting Physician (Ophthalmology) Ricarda Frame, MD as Consulting Physician (General Surgery) Kathyrn Sheriff, [facility] (Inactive) as Pharmacist (Pharmacist)  Indicate any recent Medical Services you may have received from other than [facility] providers in the past year (date may be approximate).     Assessment:   This is a routine wellness  examination for [name].  Hearing/Vision screen Vision Screening - Comments:: Wears rx glasses - up to date with routine eye exams with  Dr.King    Goals Addressed               This Visit's Progress     Increase physical activity (pt-stated)   On track     I will continue to exercise for at least 30 min daily.        Depression Screen    08/21/2023    3:03 PM 06/06/2023    8:57 AM 03/20/2023    2:09 PM 02/09/2023    3:04 PM 12/26/2022    2:11 PM 08/16/2022    2:32 PM 07/01/2021    2:16 PM  PHQ 2/9 Scores  PHQ - 2 Score 0 0 0 0 0 0 0  PHQ- 9 Score  1 0 1 1  3     Fall Risk    08/21/2023    3:01 PM 06/06/2023    8:57 AM 03/20/2023    2:09 PM 02/09/2023    3:04 PM 12/26/2022    2:10 PM  Fall Risk   Falls in the past year? 0 0 0 0 0  Number falls in past yr: 0 0 0  0  Injury with Fall? 0 0 0  0  Risk for fall due to : No Fall Risks No Fall Risks No Fall Risks  No Fall Risks  Follow up Falls prevention discussed Falls evaluation completed Falls evaluation completed  Falls evaluation completed    MEDICARE RISK AT HOME: Medicare Risk at Home Any stairs in or around the home?: No If so, are there any without handrails?: No Home free of loose throw rugs in walkways, pet beds, electrical cords, etc?: Yes Adequate lighting in your home to reduce risk of falls?: Yes Life alert?: No Use of a cane, walker or w/c?: No Grab bars in the bathroom?: Yes Shower chair or  bench in shower?: Yes Elevated toilet seat or a handicapped toilet?: Yes  TIMED UP AND GO:  Was the test performed?  No    Cognitive Function:    06/10/2020    2:02 PM 05/23/2017   10:41 AM 05/20/2016    8:38 AM  MMSE - Mini Mental State Exam  Orientation to time 5 5 5   Orientation to Place 5 5 5   Registration 3 3 3   Attention/ Calculation 5 0 0  Recall 3 3 3   Language- name 2 objects  0 0  Language- repeat 1 1 1   Language- follow 3 step command  3 3  Language- read & follow direction  0 0  Write a  sentence  0 0  Copy design  0 0  Total score  20 20        08/21/2023    3:04 PM 08/16/2022    2:36 PM  6CIT Screen  What Year? 0 points 0 points  What month? 0 points 0 points  What time? 0 points 0 points  Count back from 20 0 points 0 points  Months in reverse 0 points 0 points  Repeat phrase 0 points 0 points  Total Score 0 points 0 points    Immunizations Immunization History  Administered Date(s) Administered   Fluad Quad(high Dose 65+) 06/27/2019, 07/01/2021, 08/25/2022   Influenza Split 08/10/2012   Influenza, High Dose Seasonal PF 08/01/2014, 08/10/2016, 07/18/2017, 07/12/2018, 07/29/2020   Influenza,inj,Quad PF,6+ Mos 08/13/2015   Influenza-Unspecified 07/01/2013, 07/01/2014, 07/18/2017   PFIZER(Purple Top)SARS-COV-2 Vaccination 11/21/2019, 12/12/2019, 11/12/2020   Pneumococcal Conjugate-13 05/13/2014   Pneumococcal Polysaccharide-23 02/17/2010   Td 02/11/2009   Zoster, Live 05/07/2010    TDAP status: Due, Education has been provided regarding the importance of this vaccine. Advised may receive this vaccine at local pharmacy or Health Dept. Aware to provide a copy of the vaccination record if obtained from local pharmacy or Health Dept. Verbalized acceptance and understanding.  Flu Vaccine status: Due, Education has been provided regarding the importance of this vaccine. Advised may receive this vaccine at local pharmacy or Health Dept. Aware to provide a copy of the vaccination record if obtained from local pharmacy or Health Dept. Verbalized acceptance and understanding.  Pneumococcal vaccine status: Up to date  Covid-19 vaccine status: Completed vaccines  Qualifies for Shingles Vaccine? Yes   Zostavax completed No   Shingrix Completed?: No.    Education has been provided regarding the importance of this vaccine. Patient has been advised to call insurance company to determine out of pocket expense if they have not yet received this vaccine. Advised may also  receive vaccine at local pharmacy or Health Dept. Verbalized acceptance and understanding.  Screening Tests Health Maintenance  Topic Date Due   Zoster Vaccines- Shingrix (1 of 2) 05[birth year]17/1965   DTaP/Tdap/Td (2 - Tdap) 02/12/2019   INFLUENZA VACCINE  06/01/2023   Medicare Annual Wellness (AWV)  08/20/2024   DEXA SCAN  03/05/2025   Pneumonia Vaccine 57+ Years old  Completed   Hepatitis C Screening  Completed   HPV VACCINES  Aged Out   Colonoscopy  Discontinued   COVID-19 Vaccine  Discontinued    Health Maintenance  Health Maintenance Due  Topic Date Due   Zoster Vaccines- Shingrix (1 of 2) 05/17/1965   DTaP/Tdap/Td (2 - Tdap) 02/12/2019   INFLUENZA VACCINE  06/01/2023    Colorectal cancer screening: No longer required.   Mammogram status: No longer required due to age.  Bone Density status: Completed  03/06/2023. Results reflect: Bone density results: OSTEOPOROSIS. Repeat every 2 years.  Lung Cancer Screening: (Low Dose CT Chest recommended if Age 23-80 years, 20 pack-year currently smoking OR have quit w/in 15years.) does not qualify.   Lung Cancer Screening Referral: n/a  Additional Screening:  Hepatitis C Screening: does not qualify; Completed 05/20/2016  Vision Screening: Recommended annual ophthalmology exams for early detection of glaucoma and other disorders of the eye. Is the patient up to date with their annual eye exam?  Yes  Who is the provider or what is the name of the office in which the patient attends annual eye exams? Dr.King  If pt is not established with a provider, would they like to be referred to a provider to establish care? No .   Dental Screening: Recommended annual dental exams for proper oral hygiene   Community Resource Referral / Chronic Care Management: CRR required this visit?  No   CCM required this visit?  No     Plan:     I have personally reviewed and noted the following in the patient's chart:   Medical and social  history Use of alcohol, tobacco or illicit drugs  Current medications and supplements including opioid prescriptions. Patient is not currently taking opioid prescriptions. Functional ability and status Nutritional status Physical activity Advanced directives List of other physicians Hospitalizations, surgeries, and ER visits in previous 12 months Vitals Screenings to include cognitive, depression, and falls Referrals and appointments  In addition, I have reviewed and discussed with patient certain preventive protocols, quality metrics, and best practice recommendations. A written personalized care plan for preventive services as well as general preventive health recommendations were provided to patient.     Lorrene Reid, LPN   [id number]   After Visit Summary: (MyChart) Due to this being a telephonic visit, the after visit summary with patients personalized plan was offered to patient via MyChart   Nurse Notes: none

## 2023-08-22 ENCOUNTER — Other Ambulatory Visit (INDEPENDENT_AMBULATORY_CARE_PROVIDER_SITE_OTHER): Payer: PPO

## 2023-08-22 DIAGNOSIS — D649 Anemia, unspecified: Secondary | ICD-10-CM

## 2023-08-22 DIAGNOSIS — E559 Vitamin D deficiency, unspecified: Secondary | ICD-10-CM | POA: Diagnosis not present

## 2023-08-22 DIAGNOSIS — E7800 Pure hypercholesterolemia, unspecified: Secondary | ICD-10-CM

## 2023-08-22 DIAGNOSIS — E039 Hypothyroidism, unspecified: Secondary | ICD-10-CM

## 2023-08-22 LAB — CBC WITH DIFFERENTIAL/PLATELET
Basophils Absolute: 0.1 10*3/uL (ref 0.0–0.1)
Basophils Relative: 0.9 % (ref 0.0–3.0)
Eosinophils Absolute: 0.1 10*3/uL (ref 0.0–0.7)
Eosinophils Relative: 2.1 % (ref 0.0–5.0)
HCT: 40.7 % (ref 36.0–46.0)
Hemoglobin: 13.3 g/dL (ref 12.0–15.0)
Lymphocytes Relative: 26.7 % (ref 12.0–46.0)
Lymphs Abs: 1.9 10*3/uL (ref 0.7–4.0)
MCHC: 32.5 g/dL (ref 30.0–36.0)
MCV: 96.1 fL (ref 78.0–100.0)
Monocytes Absolute: 0.5 10*3/uL (ref 0.1–1.0)
Monocytes Relative: 7.7 % (ref 3.0–12.0)
Neutro Abs: 4.4 10*3/uL (ref 1.4–7.7)
Neutrophils Relative %: 62.6 % (ref 43.0–77.0)
Platelets: 186 10*3/uL (ref 150.0–400.0)
RBC: 4.24 Mil/uL (ref 3.87–5.11)
RDW: 15.3 % (ref 11.5–15.5)
WBC: 7 10*3/uL (ref 4.0–10.5)

## 2023-08-22 LAB — COMPREHENSIVE METABOLIC PANEL
ALT: 22 U/L (ref 0–35)
AST: 22 U/L (ref 0–37)
Albumin: 4.2 g/dL (ref 3.5–5.2)
Alkaline Phosphatase: 45 U/L (ref 39–117)
BUN: 12 mg/dL (ref 6–23)
CO2: 30 meq/L (ref 19–32)
Calcium: 8.9 mg/dL (ref 8.4–10.5)
Chloride: 103 meq/L (ref 96–112)
Creatinine, Ser: 0.75 mg/dL (ref 0.40–1.20)
GFR: 75.8 mL/min (ref >60.00)
Glucose, Bld: 94 mg/dL (ref 70–99)
Potassium: 4.3 meq/L (ref 3.5–5.1)
Sodium: 140 meq/L (ref 135–145)
Total Bilirubin: 0.7 mg/dL (ref 0.2–1.2)
Total Protein: 6.4 g/dL (ref 6.0–8.3)

## 2023-08-22 LAB — VITAMIN B12: Vitamin B-12: 481 pg/mL (ref 211–911)

## 2023-08-22 LAB — LIPID PANEL
Cholesterol: 167 mg/dL (ref 0–200)
HDL: 41.4 mg/dL (ref >39.00)
LDL Cholesterol: 84 mg/dL (ref 0–99)
NonHDL: 125.62
Total CHOL/HDL Ratio: 4
Triglycerides: 207 mg/dL — ABNORMAL HIGH (ref 0.0–149.0)
VLDL: 41.4 mg/dL — ABNORMAL HIGH (ref 0.0–40.0)

## 2023-08-22 LAB — VITAMIN D 25 HYDROXY (VIT D DEFICIENCY, FRACTURES): VITD: 33.62 ng/mL (ref 30.00–100.00)

## 2023-08-22 LAB — TSH: TSH: 17.66 u[IU]/mL — ABNORMAL HIGH (ref 0.35–5.50)

## 2023-08-22 LAB — IRON: Iron: 124 ug/dL (ref 42–145)

## 2023-08-28 ENCOUNTER — Ambulatory Visit (INDEPENDENT_AMBULATORY_CARE_PROVIDER_SITE_OTHER): Payer: PPO | Admitting: Family Medicine

## 2023-08-28 ENCOUNTER — Encounter: Payer: Self-pay | Admitting: Family Medicine

## 2023-08-28 VITALS — BP 124/62 | HR 76 | Temp 98.1°F | Ht 64.0 in | Wt 126.6 lb

## 2023-08-28 DIAGNOSIS — G4700 Insomnia, unspecified: Secondary | ICD-10-CM

## 2023-08-28 DIAGNOSIS — Z0000 Encounter for general adult medical examination without abnormal findings: Secondary | ICD-10-CM

## 2023-08-28 DIAGNOSIS — R059 Cough, unspecified: Secondary | ICD-10-CM | POA: Diagnosis not present

## 2023-08-28 DIAGNOSIS — Z23 Encounter for immunization: Secondary | ICD-10-CM | POA: Diagnosis not present

## 2023-08-28 DIAGNOSIS — E039 Hypothyroidism, unspecified: Secondary | ICD-10-CM

## 2023-08-28 DIAGNOSIS — E7800 Pure hypercholesterolemia, unspecified: Secondary | ICD-10-CM

## 2023-08-28 DIAGNOSIS — Z7189 Other specified counseling: Secondary | ICD-10-CM

## 2023-08-28 MED ORDER — BENZONATATE 200 MG PO CAPS: 200.0000 mg | ORAL_CAPSULE | Freq: Three times a day (TID) | ORAL | 3 refills | Status: DC | PRN

## 2023-08-28 MED ORDER — LEVOTHYROXINE SODIUM 75 MCG PO TABS: ORAL_TABLET | ORAL | 1 refills | Status: DC

## 2023-08-28 NOTE — Progress Notes (Addendum)
Vaccines d/w pt. Due for shingrix when possible.  Flu 2024 Mammogram 2024 DXA 2024 Advance directive- d/w pt.  Has a living will.  Would have [name], then her niece [name] designated in that order should patient be incapacitated.   Colonoscopy 2024  Still having HA that improves with tylenol.  Frontal usually, occ occipital.    Sleep disruption d/w pt.  Nocturia if she wakes up but unclear if nocturia is the primary issue.  Sometimes waking 4-5 times per night.  Longstanding since caring for her son.  She tries to limit napping.    Hypothyroidism.  TSH elevated.   Compliant with med.    Elevated Cholesterol: Using medications without problems: yes Muscle aches: no  Diet compliance: yes Exercise: yes Labs d/w pt.    Prev abd pain eval with CT:  IMPRESSION: 1. Mild focal small-bowel dilatation at the anastomosis in the right lower pelvis. No suggestion of obstruction. 2. Diffuse narrowing and mucosal thickening descending and sigmoid colon which can be seen with colitis or normal peristalsis. 3. Interval resolution of the midline lower abdominal fluid collection. 4. Mild splenomegaly. 5. Subcentimeter cyst in the liver. Abd pain is better in the meantime.    Cough. Dry cough.  Prev UC eval.  Tessalon use d/w pt.  Taking delsym in the meantime.  No sputum usually.  No fevers.  Used cough drops.   Meds, vitals, and allergies reviewed.   ROS: Per HPI unless specifically indicated in ROS section   GEN: nad, alert and oriented HEENT: mucous membranes moist NECK: supple w/o LA CV: rrr. PULM: ctab, no inc wob ABD: soft, +bs EXT: no edema SKIN: small benign 5mm SK on the L lower back.

## 2023-08-28 NOTE — Patient Instructions (Addendum)
You could try melatonin 5mg  at night for sleep.  If needing help with nighttime urination, then let me know.   Cough should gradually get better- try tessalon as needed.  Try the higher dose of levothyroxine and recheck in about 2 months at a nonfasting lab visit.  Take care.  Glad to see you.

## 2023-08-31 ENCOUNTER — Encounter: Payer: Self-pay | Admitting: Family Medicine

## 2023-08-31 DIAGNOSIS — R059 Cough, unspecified: Secondary | ICD-10-CM | POA: Insufficient documentation

## 2023-08-31 DIAGNOSIS — G4700 Insomnia, unspecified: Secondary | ICD-10-CM | POA: Insufficient documentation

## 2023-08-31 NOTE — Assessment & Plan Note (Signed)
Continue pravastatin 

## 2023-08-31 NOTE — Assessment & Plan Note (Signed)
Advance directive- d/w pt.  Has a living will.  Would have [name], then her niece [name] designated in that order should patient be incapacitated.

## 2023-08-31 NOTE — Assessment & Plan Note (Addendum)
Vaccines d/w pt. Due for shingrix when possible.  Flu 2024 Mammogram 2024 DXA 2024 Advance directive- d/w pt.  Has a living will.  Would have Charlcie Cradle, then her niece [name] designated in that order should patient be incapacitated.   Colonoscopy 2024

## 2023-08-31 NOTE — Assessment & Plan Note (Signed)
TSH elevated.   Compliant with med.  Will increase levothyroxine and recheck in about 2 months at a nonfasting lab visit.

## 2023-08-31 NOTE — Assessment & Plan Note (Signed)
Unclear if nocturia is the primary issue.  Discussed with patient.  She could try melatonin 5mg  at night for sleep.  If needing help with nighttime urination, then let me know.

## 2023-08-31 NOTE — Assessment & Plan Note (Signed)
Lungs are clear.  Can use Tessalon as needed and update me as needed.

## 2023-09-01 ENCOUNTER — Encounter: Payer: Self-pay | Admitting: Family Medicine

## 2023-09-21 DIAGNOSIS — Z9889 Other specified postprocedural states: Secondary | ICD-10-CM | POA: Diagnosis not present

## 2023-09-21 DIAGNOSIS — H43813 Vitreous degeneration, bilateral: Secondary | ICD-10-CM | POA: Diagnosis not present

## 2023-09-21 DIAGNOSIS — H40003 Preglaucoma, unspecified, bilateral: Secondary | ICD-10-CM | POA: Diagnosis not present

## 2023-09-21 DIAGNOSIS — Z961 Presence of intraocular lens: Secondary | ICD-10-CM | POA: Diagnosis not present

## 2023-10-05 ENCOUNTER — Other Ambulatory Visit: Payer: Self-pay | Admitting: Gastroenterology

## 2023-11-02 ENCOUNTER — Telehealth: Payer: Self-pay

## 2023-11-02 ENCOUNTER — Telehealth: Payer: Self-pay | Admitting: Family Medicine

## 2023-11-02 NOTE — Telephone Encounter (Signed)
 Copied from CRM [id number]. Topic: Clinical - Request for Lab/Test Order >> Oct 31, 2023  1:01 PM Isabell A wrote: Reason for CRM: Patient calling to schedule lab appointment to check her thyroids - no lab orders on file.     Checked patient chart lab orders are in please call to set up lab appointment

## 2023-11-02 NOTE — Telephone Encounter (Deleted)
 Copied from CRM [id number]. Topic: Clinical - Request for Lab/Test Order >> Oct 31, 2023  1:01 PM Isabell A wrote: Reason for CRM: Patient calling to schedule lab appointment to check her thyroids - no lab orders on file.

## 2023-11-02 NOTE — Telephone Encounter (Signed)
 Copied from CRM [id number]. Topic: General - Other >> Nov 02, 2023  3:31 PM Whitney O wrote: Reason for CRM: patient is calling cause they set up for lab work but patient has ;lab on January the 15th at the cancer center . He always does a thyroid  panel. Patient is wanting to know should she cancel the lab work she has with Dr cleatus on the 7th or should she keep the appointment. Dr Cleatus says Thyroid  was out of function and we suppose to check it in 2  months but patient didn't know this appointment at cancer center was so close

## 2023-11-03 NOTE — Telephone Encounter (Signed)
 Spoke with patient to advised that we will cancel the lab appointment here and that the order for the TSH lab to done at the cancer center is in the system.

## 2023-11-03 NOTE — Telephone Encounter (Signed)
 I would cancel the lab visit here and get all the labs done at the cancer clinic if possible.  I routed this to oncology for help with rechecking the TSH.  I thank all involved.   Dr. KATHEE- can you please recheck her TSH?  I have an order in the EMR.  I greatly appreciate your help.

## 2023-11-07 ENCOUNTER — Other Ambulatory Visit: Payer: PPO

## 2023-11-14 ENCOUNTER — Other Ambulatory Visit: Payer: PPO

## 2023-11-15 ENCOUNTER — Inpatient Hospital Stay: Payer: PPO | Attending: Internal Medicine

## 2023-11-15 DIAGNOSIS — Z85850 Personal history of malignant neoplasm of thyroid: Secondary | ICD-10-CM | POA: Diagnosis not present

## 2023-11-15 DIAGNOSIS — C3431 Malignant neoplasm of lower lobe, right bronchus or lung: Secondary | ICD-10-CM

## 2023-11-15 DIAGNOSIS — Z85118 Personal history of other malignant neoplasm of bronchus and lung: Secondary | ICD-10-CM | POA: Insufficient documentation

## 2023-11-15 LAB — CBC WITH DIFFERENTIAL (CANCER CENTER ONLY)
Abs Immature Granulocytes: 0.03 10*3/uL (ref 0.00–0.07)
Basophils Absolute: 0.1 10*3/uL (ref 0.0–0.1)
Basophils Relative: 1 %
Eosinophils Absolute: 0.1 10*3/uL (ref 0.0–0.5)
Eosinophils Relative: 1 %
HCT: 39.5 % (ref 36.0–46.0)
Hemoglobin: 12.8 g/dL (ref 12.0–15.0)
Immature Granulocytes: 1 %
Lymphocytes Relative: 24 %
Lymphs Abs: 1.6 10*3/uL (ref 0.7–4.0)
MCH: 31.1 pg (ref 26.0–34.0)
MCHC: 32.4 g/dL (ref 30.0–36.0)
MCV: 95.9 fL (ref 80.0–100.0)
Monocytes Absolute: 0.6 10*3/uL (ref 0.1–1.0)
Monocytes Relative: 9 %
Neutro Abs: 4.3 10*3/uL (ref 1.7–7.7)
Neutrophils Relative %: 64 %
Platelet Count: 158 10*3/uL (ref 150–400)
RBC: 4.12 MIL/uL (ref 3.87–5.11)
RDW: 14.3 % (ref 11.5–15.5)
WBC Count: 6.6 10*3/uL (ref 4.0–10.5)
nRBC: 0 % (ref 0.0–0.2)

## 2023-11-15 LAB — CMP (CANCER CENTER ONLY)
ALT: 26 U/L (ref 0–44)
AST: 26 U/L (ref 15–41)
Albumin: 4.3 g/dL (ref 3.5–5.0)
Alkaline Phosphatase: 47 U/L (ref 38–126)
Anion gap: 7 (ref 5–15)
BUN: 17 mg/dL (ref 8–23)
CO2: 30 mmol/L (ref 22–32)
Calcium: 9.2 mg/dL (ref 8.9–10.3)
Chloride: 100 mmol/L (ref 98–111)
Creatinine: 0.63 mg/dL (ref 0.44–1.00)
GFR, Estimated: >60 mL/min (ref >60)
Glucose, Bld: 89 mg/dL (ref 70–99)
Potassium: 4.1 mmol/L (ref 3.5–5.1)
Sodium: 137 mmol/L (ref 135–145)
Total Bilirubin: 0.8 mg/dL (ref 0.0–1.2)
Total Protein: 6.6 g/dL (ref 6.5–8.1)

## 2023-11-16 ENCOUNTER — Encounter: Payer: Self-pay | Admitting: Cardiology

## 2023-11-16 ENCOUNTER — Ambulatory Visit: Payer: PPO | Attending: Cardiology | Admitting: Cardiology

## 2023-11-16 VITALS — BP 148/72 | HR 78 | Ht 64.0 in | Wt 127.6 lb

## 2023-11-16 DIAGNOSIS — I2510 Atherosclerotic heart disease of native coronary artery without angina pectoris: Secondary | ICD-10-CM

## 2023-11-16 DIAGNOSIS — E7800 Pure hypercholesterolemia, unspecified: Secondary | ICD-10-CM | POA: Diagnosis not present

## 2023-11-16 LAB — THYROID PANEL WITH TSH
Free Thyroxine Index: 2.2 (ref 1.2–4.9)
T3 Uptake Ratio: 28 % (ref 24–39)
T4, Total: 7.8 ug/dL (ref 4.5–12.0)
TSH: 5.3 u[IU]/mL — ABNORMAL HIGH (ref 0.450–4.500)

## 2023-11-16 NOTE — Progress Notes (Signed)
Cardiology Office Note:    Date:  11/16/2023   ID:  [name], DOB Jan 28, 1946, MRN [id number]  PCP:  Karen Nam, MD  Cardiologist:  Karen Odea, MD  Electrophysiologist:  None   Referring MD: Karen Nam, MD   Chief Complaint  Patient presents with   Follow-up    Patient reports intermittent left chest, epigastric, and left arm pain.      History of Present Illness:    [name] is a 78 y.o. female with a hx of hyperlipidemia, coronary calcifications, pulmonary nodule, who presents for follow-up.   Has occasional chest pain, left arm pain and back pain not associated with exertion.  Also endorses breast pain.  Patient is worried since a very close friend of hers passed from a heart attack recently.  Otherwise compliant with aspirin, Pravachol as prescribed.  Blood work 2 months ago showed slightly elevated triglycerides levels.  Prior notes chest CT 04/2020 showed aortic atherosclerosis and also calcifications in the LAD.   Echocardiogram 12/2019 showed normal systolic and diastolic function, EF 60 to 65%. Lexiscan Myoview 12/2019, normal, no evidence for ischemia. History of statin intolerance/myalgias with simvastatin, Lipitor.  Able to tolerate Pravachol.   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  Could be due to hernia or old scar tissue.  prev with CT done w/o alarming findings.  Will treat episodically.  Use tylenol #3 prn.     Headache    Hyperlipidemia    Lipoma of thigh    Left   Lung nodule    Normal cardiac stress test 2014   PONV (postoperative nausea and vomiting)    nausea after hysterectomy, and 2 days after lung surgery 10/2015   Thyroid cancer (HCC) 12/2015   Thyroid nodule     Past Surgical History:  Procedure Laterality Date   ABDOMINAL  HYSTERECTOMY     BIOPSY  04/17/2023   Procedure: BIOPSY;  Surgeon: Karen Reil, MD;  Location: [facility];  Service: Gastroenterology;;   BREAST EXCISIONAL BIOPSY Right 1977   BREAST MASS EXCISION  1977   benign   BREAST SURGERY     CARPAL TUNNEL RELEASE Right 1978   CATARACT EXTRACTION, BILATERAL Bilateral    CHOLECYSTECTOMY  11/23/2016   Procedure: LAPAROSCOPIC CHOLECYSTECTOMY;  Surgeon: Karen Frame, MD;  Location: [facility];  Service: General;;   COLONOSCOPY  2009   COLONOSCOPY WITH PROPOFOL N/A 05/18/2019   Procedure: COLONOSCOPY WITH PROPOFOL;  Surgeon: Karen Spillers, MD;  Location: [facility];  Service: Endoscopy;  Laterality: N/A;   COLONOSCOPY WITH PROPOFOL N/A 08/15/2019   Procedure: COLONOSCOPY WITH PROPOFOL;  Surgeon: Karen Spillers, MD;  Location: [facility];  Service: Endoscopy;  Laterality: N/A;   COLONOSCOPY WITH PROPOFOL N/A 11/04/2022   Procedure: COLONOSCOPY WITH PROPOFOL;  Surgeon: Karen Reil, MD;  Location: [facility] ENDOSCOPY;  Service: Gastroenterology;  Laterality: N/A;   DILATION AND CURETTAGE OF UTERUS  2001   ELECTROMAGNETIC NAVIGATION BROCHOSCOPY N/A 08/25/2015   Procedure: ELECTROMAGNETIC NAVIGATION BRONCHOSCOPY;  Surgeon: Karen Fulling, MD;  Location: [facility];  Service: Cardiopulmonary;  Laterality: N/A;   ENDOBRONCHIAL ULTRASOUND N/A 08/25/2015   Procedure: ENDOBRONCHIAL ULTRASOUND;  Surgeon: Karen Fulling, MD;  Location: [facility];  Service: Cardiopulmonary;  Laterality: N/A;   ESOPHAGOGASTRODUODENOSCOPY  2002, 11/15/07   Normal (Dr. Troy Hammond)  ESOPHAGOGASTRODUODENOSCOPY (EGD) WITH PROPOFOL N/A 05/18/2019   Procedure: ESOPHAGOGASTRODUODENOSCOPY (EGD) WITH PROPOFOL;  Surgeon: Karen Spillers, MD;  Location: [facility];  Service: Endoscopy;  Laterality: N/A;   ESOPHAGOGASTRODUODENOSCOPY (EGD) WITH PROPOFOL N/A 08/15/2019   Procedure: ESOPHAGOGASTRODUODENOSCOPY (EGD) WITH PROPOFOL;  Surgeon: Karen Spillers, MD;   Location: [facility];  Service: Endoscopy;  Laterality: N/A;   ESOPHAGOGASTRODUODENOSCOPY (EGD) WITH PROPOFOL N/A 04/17/2023   Procedure: ESOPHAGOGASTRODUODENOSCOPY (EGD) WITH PROPOFOL;  Surgeon: Karen Reil, MD;  Location: [facility] ENDOSCOPY;  Service: Gastroenterology;  Laterality: N/A;   EYE SURGERY     FACIAL COSMETIC SURGERY  01/13/2010   mini facelift   HERNIA REPAIR  1976   INSERTION OF MESH  07/06/2022   Procedure: INSERTION OF MESH;  Surgeon: Karen Shiver, MD;  Location: [facility];  Service: General;;   OOPHORECTOMY     PTOSIS REPAIR  12/08/2021   THORACOTOMY/LOBECTOMY Right 10/27/2015   Procedure: THORACOTOMY/LOBECTOMY;  Surgeon: Karen Marin, MD;  Location: [facility];  Service: Thoracic;  Laterality: Right;   THYROIDECTOMY N/A 12/29/2015   Procedure: THYROIDECTOMY;  Surgeon: Karen Salmons, MD;  Location: [facility];  Service: ENT;  Laterality: N/A;   TOTAL VAGINAL HYSTERECTOMY  12/30/2008   for fibroid pain (Dr. Huntley Dec)   XI ROBOTIC ASSISTED SMALL BOWEL RESECTION N/A 06/28/2022   Procedure: XI ROBOTIC ASSISTED SMALL BOWEL RESECTION;  Surgeon: Karen Shiver, MD;  Location: [facility];  Service: General;  Laterality: N/A;    Current Medications: Current Meds  Medication Sig   aspirin EC 81 MG tablet Take 1 tablet (81 mg total) by mouth daily.   Biotin 1000 MCG tablet Take 1,000 mcg by mouth in the morning and at bedtime.   cholecalciferol (VITAMIN D3) 25 MCG (1000 UNIT) tablet Take 1,000 Units by mouth daily.   Coenzyme Q10 200 MG capsule Take 100 mg by mouth daily.   levothyroxine (SYNTHROID) 75 MCG tablet 1 pill a day except for 2 pills on Sundays.   multivitamin-lutein (OCUVITE-LUTEIN) CAPS capsule Take 1 capsule by mouth daily.   pantoprazole (PROTONIX) 40 MG tablet Take 1 tablet (40 mg total) by mouth daily.   Potassium Gluconate 595 MG CAPS Take 595 mg by mouth daily.    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  He has short term memory loss.     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   Previously had an adopted son who died in 21-Nov-2019.   Social Drivers of Health   Financial Resource Strain: Low Risk  (08/21/2023)   Overall Financial Resource Strain (CARDIA)  Difficulty of Paying Living Expenses: Not hard at all  Food Insecurity: No Food Insecurity (08/21/2023)   Hunger Vital Sign    Worried About Running Out of Food in the Last Year: Never true    Ran Out of Food in the Last Year: Never true  Transportation Needs: No Transportation Needs (08/21/2023)   PRAPARE - Administrator, Civil Service (Medical): No    Lack of Transportation (Non-Medical): No  Physical Activity: Sufficiently Active (08/21/2023)   Exercise Vital Sign    Days of Exercise per Week: 5 days    Minutes of Exercise per Session: 30 min  Stress: No Stress Concern Present (08/21/2023)   [employer] of Occupational Health - Occupational Stress Questionnaire    Feeling of Stress : Not at all  Social Connections: Socially Integrated (08/21/2023)   Social Connection and Isolation Panel [NHANES]    Frequency of Communication with  Friends and Family: More than three times a week    Frequency of Social Gatherings with Friends and Family: More than three times a week    Attends Religious Services: More than 4 times per year    Active Member of [employer] or Organizations: Yes    Attends Engineer, structural: More than 4 times per year    Marital Status: Married     Family History: The patient's family history includes Cancer in her sister; Hypertension in her mother, sister, sister, sister, and sister; Stroke in her mother; Transient ischemic attack in her mother. There is no history of Breast cancer or Colon cancer.  ROS:   Please see the history of present illness.     All other systems reviewed and are negative.  EKGs/Labs/Other Studies Reviewed:    The following studies were reviewed today:   EKG:  EKG is  ordered today.  The ekg ordered today demonstrates normal sinus rhythm, normal ekg.  Recent Labs: 11/15/2023: ALT 26; BUN 17; Creatinine 0.63; Hemoglobin 12.8; Platelet Count 158; Potassium 4.1; Sodium 137; TSH 5.300  Recent Lipid Panel    Component Value Date/Time   CHOL 167 08/22/2023 0812   CHOL 145 07/01/2013 0103   TRIG 207.0 (H) 08/22/2023 0812   TRIG 224 (H) 07/01/2013 0103   HDL 41.40 08/22/2023 0812   HDL 29 (L) 07/01/2013 0103   CHOLHDL 4 08/22/2023 0812   VLDL 41.4 (H) 08/22/2023 0812   VLDL 45 (H) 07/01/2013 0103   LDLCALC 84 08/22/2023 0812   LDLCALC 71 07/01/2013 0103   LDLDIRECT 83.8 03/11/2011 0823    Physical Exam:    VS:  BP (!) 148/72 (BP Location: Left Arm, Patient Position: Sitting, Cuff Size: Normal)   Pulse 78   Ht 5\' 4"  (1.626 m)   Wt 127 lb 9.6 oz (57.9 kg)   SpO2 99%   BMI 21.90 kg/m     Wt Readings from Last 3 Encounters:  11/16/23 127 lb 9.6 oz (57.9 kg)  08/28/23 126 lb 9.6 oz (57.4 kg)  08/21/23 124 lb (56.2 kg)     GEN:  Well nourished, well developed in no acute distress HEENT: Normal NECK: No JVD; No carotid bruits CARDIAC: RRR, no murmurs,  rubs, gallops RESPIRATORY:  Clear to auscultation without rales, wheezing or rhonchi  ABDOMEN: Soft, non-tender, non-distended MUSCULOSKELETAL:  No edema; No deformity  SKIN: Warm and dry NEUROLOGIC:  Alert and oriented x 3 PSYCHIATRIC:  Normal affect   ASSESSMENT:    1. Coronary artery calcification   2. Pure hypercholesterolemia  PLAN:    In order of problems listed above:  Chest pain, coronary CT 7/23 showed calcium score 19.1, minimal proximal LAD stenosis < 25%.   Continue aspirin 81 mg daily, pravastatin 40 mg daily.  Chest pain symptoms appear musculoskeletal, noncardiac. Hyperlipidemia, glycerides slightly elevated, total cholesterol and LDL controlled, continue pravastatin 40 mg daily.  Low-cholesterol diet advised.  Did not tolerate Lipitor and simvastatin in the past  Follow-up yearly.   Medication Adjustments/Labs and Tests Ordered: Current medicines are reviewed at length with the patient today.  Concerns regarding medicines are outlined above.  Orders Placed This Encounter  Procedures   EKG 12-Lead   No orders of the defined types were placed in this encounter.   Patient Instructions  Medication Instructions:   Your physician recommends that you continue on your current medications as directed. Please refer to the Current Medication list given to you today.  *If you need a refill on your cardiac medications before your next appointment, please call your pharmacy*   Lab Work:  None Ordered  If you have labs (blood work) drawn today and your tests are completely normal, you will receive your results only by: MyChart Message (if you have MyChart) OR A paper copy in the mail If you have any lab test that is abnormal or we need to change your treatment, we will call you to review the results.   Testing/Procedures:  None Ordered   Follow-Up: At [facility], you and your health needs are our priority.  As part of our continuing mission to  provide you with exceptional heart care, we have created designated Provider Care Teams.  These Care Teams include your primary Cardiologist (physician) and Advanced Practice Providers (APPs -  Physician Assistants and Nurse Practitioners) who all work together to provide you with the care you need, when you need it.  We recommend signing up for the patient portal called "MyChart".  Sign up information is provided on this After Visit Summary.  MyChart is used to connect with patients for Virtual Visits (Telemedicine).  Patients are able to view lab/test results, encounter notes, upcoming appointments, etc.  Non-urgent messages can be sent to your provider as well.   To learn more about what you can do with MyChart, go to [email or web address].    Your next appointment:   12 month(s)  Provider:   You may see Karen Odea, MD or one of the following Advanced Practice Providers on your designated Care Team:   Nicolasa Ducking, NP Eula Listen, PA-C Cadence Fransico Michael, PA-C Charlsie Quest, NP Carlos Levering, NP    Signed, Karen Odea, MD  11/16/2023 11:00 AM    [facility]

## 2023-11-16 NOTE — Patient Instructions (Signed)

## 2023-11-26 LAB — THYROGLOBULIN BY RIA: Thyroglobulin by RIA: <2 ng/mL

## 2023-11-26 LAB — TGAB+THYROGLOBULIN IMA OR RIA: Thyroglobulin Antibody: 10.1 [IU]/mL — ABNORMAL HIGH (ref 0.0–0.9)

## 2023-11-29 ENCOUNTER — Ambulatory Visit
Admission: RE | Admit: 2023-11-29 | Discharge: 2023-11-29 | Disposition: A | Discharge status: Home / Self Care | Payer: PPO | Source: Ambulatory Visit | Attending: Internal Medicine | Admitting: Internal Medicine

## 2023-11-29 DIAGNOSIS — R590 Localized enlarged lymph nodes: Secondary | ICD-10-CM | POA: Diagnosis not present

## 2023-11-29 DIAGNOSIS — C3490 Malignant neoplasm of unspecified part of unspecified bronchus or lung: Secondary | ICD-10-CM | POA: Diagnosis not present

## 2023-11-29 DIAGNOSIS — I700 Atherosclerosis of aorta: Secondary | ICD-10-CM | POA: Diagnosis not present

## 2023-11-29 DIAGNOSIS — C3431 Malignant neoplasm of lower lobe, right bronchus or lung: Secondary | ICD-10-CM | POA: Diagnosis not present

## 2023-11-29 DIAGNOSIS — R161 Splenomegaly, not elsewhere classified: Secondary | ICD-10-CM | POA: Diagnosis not present

## 2023-11-29 MED ORDER — IOHEXOL 300 MG/ML  SOLN
60.0000 mL | Freq: Once | INTRAMUSCULAR | Status: AC | PRN
  Administered 2023-11-29: 60 mL via INTRAVENOUS

## 2023-12-04 ENCOUNTER — Other Ambulatory Visit: Payer: Self-pay | Admitting: Family Medicine

## 2023-12-04 NOTE — Telephone Encounter (Signed)
Copied from CRM [id number]. Topic: Clinical - Medication Refill >> Dec 04, 2023 11:17 AM Steele Sizer wrote: Most Recent Primary Care Visit:  Provider: Joaquim Nam  Department: [facility]  Visit Type: PHYSICAL  Date: 08/28/2023  Medication: pantoprazole (PROTONIX) 40 MG tablet  Has the patient contacted their pharmacy? Yes (Agent: If no, request that the patient contact the pharmacy for the refill. If patient does not wish to contact the pharmacy document the reason why and proceed with request.) (Agent: If yes, when and what did the pharmacy advise?) Pharmacy informed her to call us due to no prescription being on file   Is this the correct pharmacy for this prescription? Yes If no, delete pharmacy and type the correct one.  This is the patient's preferred pharmacy:  [id number] [place], [place] - [address] [place] [place] [address] Phone: [phone] Fax: [phone]   Has the prescription been filled recently? No  Is the patient out of the medication? No  Has the patient been seen for an appointment in the last year OR does the patient have an upcoming appointment?   Can we respond through MyChart?   Agent: Please be advised that Rx refills may take up to 3 business days. We ask that you follow-up with your pharmacy.

## 2023-12-04 NOTE — Telephone Encounter (Signed)
This medication not prescribed by you it was prescribed by Judie Petit with gastro

## 2023-12-05 MED ORDER — PANTOPRAZOLE SODIUM 40 MG PO TBEC: 40.0000 mg | DELAYED_RELEASE_TABLET | Freq: Every day | ORAL | 3 refills | Status: DC

## 2023-12-05 NOTE — Telephone Encounter (Signed)
 Sent. Thanks.

## 2023-12-06 ENCOUNTER — Encounter: Payer: Self-pay | Admitting: Internal Medicine

## 2023-12-06 ENCOUNTER — Inpatient Hospital Stay: Payer: PPO | Attending: Internal Medicine | Admitting: Internal Medicine

## 2023-12-06 VITALS — BP 115/61 | HR 73 | Temp 98.0°F | Resp 18

## 2023-12-06 DIAGNOSIS — Z85118 Personal history of other malignant neoplasm of bronchus and lung: Secondary | ICD-10-CM | POA: Insufficient documentation

## 2023-12-06 DIAGNOSIS — Z79890 Hormone replacement therapy (postmenopausal): Secondary | ICD-10-CM | POA: Insufficient documentation

## 2023-12-06 DIAGNOSIS — Z85850 Personal history of malignant neoplasm of thyroid: Secondary | ICD-10-CM | POA: Diagnosis not present

## 2023-12-06 DIAGNOSIS — C3431 Malignant neoplasm of lower lobe, right bronchus or lung: Secondary | ICD-10-CM | POA: Diagnosis not present

## 2023-12-06 NOTE — Progress Notes (Signed)
Appetite is good. Denies any dyspnea, chest pain or cough. Energy is reported as good. Pain is between her shoulder blades intermittently. Headaches have gotten better.

## 2023-12-06 NOTE — Progress Notes (Signed)
 [facility] OFFICE PROGRESS NOTE  Patient Care Team: Cleatus Arlyss RAMAN, MD as PCP - General (Family Medicine) Darliss Rogue, MD as PCP - Cardiology (Cardiology) Herminio Miu, MD as Consulting Physician (Otolaryngology) Volney Lye, MD (Inactive) as Consulting Physician (Cardiothoracic Surgery) Rennie Cindy SAUNDERS, MD as Consulting Physician (Internal Medicine) Jaye Fallow, MD as Consulting Physician (Ophthalmology) Shelva Dunnings, MD as Consulting Physician (General Surgery) Fate Morna SAILOR, [facility] (Inactive) as Pharmacist (Pharmacist)   Cancer Staging  Thyroid  cancer [facility]) Staging form: Thyroid  - Papillary or Follicular (Under 45 years), AJCC 7th Edition - Clinical: Stage I (T1, N0, M0) - Signed by Wilder Pink, MD on 09/11/2015 Laterality: Right    Oncology History Overview Note  # NOV 2016- RLL [place] ca T1N0 [incidental; Dr.Simonds; Bronc- s/p RLlobectomy; Dr.Oaks;Dec 2016]  # FEB 2017- 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 STAGE I; s/p Total Thyroidetcomy [Dr.Chapman];NO RAIU.  LOW RISK; but detectable thyroglobulin- on Synthroid   #Survivorship-pending  DIAGNOSIS: Lung cancer -stage I #thyroid  cancer low risk  GOALS: Cure  CURRENT/MOST RECENT THERAPY: Surveillance    Thyroid  cancer (HCC)  Cancer of lower lobe of right lung (HCC)  05/06/2016 Initial Diagnosis   Malignant neoplasm of lower lobe, right bronchus or lung (HCC)    INTERVAL HISTORY: Accompanied by her husband.  Ambulating independently.   [name] 78 y.o.  female pleasant patient above history of stage I lung cancer and also stage I thyroid  cancer on synthroid  currently on surveillance is here for follow-up/review results of the CT scan.   Most recently noted to have elevated TSH was.  Patient Synthroid  has been increased.   Appetite is good. Denies any dyspnea, chest pain or cough. Energy is  reported as good. Pain is between her shoulder blades intermittently. Headaches have gotten better.   Patient denies any fever or chills or cough.  Denies any nausea vomiting abdominal pain.  Denies any headaches.  Complains of chronic back pain not any worse.  Review of Systems  Constitutional:  Positive for malaise/fatigue. Negative for chills, diaphoresis, fever and weight loss.  HENT:  Negative for nosebleeds and sore throat.   Eyes:  Negative for double vision.  Respiratory:  Negative for cough, hemoptysis, sputum production, shortness of breath and wheezing.   Cardiovascular:  Negative for chest pain, palpitations, orthopnea and leg swelling.  Gastrointestinal:  Negative for abdominal pain, blood in stool, constipation, diarrhea, heartburn, melena and vomiting.  Genitourinary:  Negative for dysuria, frequency and urgency.  Musculoskeletal:  Positive for back pain. Negative for joint pain.  Skin: Negative.  Negative for itching and rash.  Neurological:  Positive for tingling. Negative for dizziness, focal weakness, weakness and headaches.  Endo/Heme/Allergies:  Does not bruise/bleed easily.  Psychiatric/Behavioral:  Negative for depression. The patient is not nervous/anxious and does not have insomnia.       PAST MEDICAL HISTORY :  Past Medical History:  Diagnosis Date   Adenocarcinoma of right lung (HCC) 10/01/2015   GERD (gastroesophageal reflux disease)    GIB (gastrointestinal bleeding)    Groin pain    Along superior/laterl R inguinal canal.  Could be due to hernia or old scar tissue.  prev with CT done w/o alarming findings.  Will treat episodically.  Use tylenol  #3 prn.     Headache    Hyperlipidemia    Lipoma of thigh    Left   Lung nodule    Normal cardiac stress test 2014  PONV (postoperative nausea and vomiting)    nausea after hysterectomy, and 2 days after lung surgery 10/2015   Thyroid  cancer (HCC) 12/2015   Thyroid  nodule     PAST SURGICAL HISTORY :    Past Surgical History:  Procedure Laterality Date   ABDOMINAL HYSTERECTOMY     BIOPSY  04/17/2023   Procedure: BIOPSY;  Surgeon: Unk Corinn Skiff, MD;  Location: [facility] ENDOSCOPY;  Service: Gastroenterology;;   BREAST EXCISIONAL BIOPSY Right 1977   BREAST MASS EXCISION  1977   benign   BREAST SURGERY     CARPAL TUNNEL RELEASE Right 1978   CATARACT EXTRACTION, BILATERAL Bilateral    CHOLECYSTECTOMY  11/23/2016   Procedure: LAPAROSCOPIC CHOLECYSTECTOMY;  Surgeon: Carlin Pastel, MD;  Location: [facility] ORS;  Service: General;;   COLONOSCOPY  2009   COLONOSCOPY WITH PROPOFOL  N/A 05/18/2019   Procedure: COLONOSCOPY WITH PROPOFOL ;  Surgeon: Janalyn Keene NOVAK, MD;  Location: [facility] ENDOSCOPY;  Service: Endoscopy;  Laterality: N/A;   COLONOSCOPY WITH PROPOFOL  N/A 08/15/2019   Procedure: COLONOSCOPY WITH PROPOFOL ;  Surgeon: Janalyn Keene NOVAK, MD;  Location: [facility] ENDOSCOPY;  Service: Endoscopy;  Laterality: N/A;   COLONOSCOPY WITH PROPOFOL  N/A 11/04/2022   Procedure: COLONOSCOPY WITH PROPOFOL ;  Surgeon: Unk Corinn Skiff, MD;  Location: [facility] ENDOSCOPY;  Service: Gastroenterology;  Laterality: N/A;   DILATION AND CURETTAGE OF UTERUS  2001   ELECTROMAGNETIC NAVIGATION BROCHOSCOPY N/A 08/25/2015   Procedure: ELECTROMAGNETIC NAVIGATION BRONCHOSCOPY;  Surgeon: Nickolas Cellar, MD;  Location: [facility] ORS;  Service: Cardiopulmonary;  Laterality: N/A;   ENDOBRONCHIAL ULTRASOUND N/A 08/25/2015   Procedure: ENDOBRONCHIAL ULTRASOUND;  Surgeon: Nickolas Cellar, MD;  Location: [facility] ORS;  Service: Cardiopulmonary;  Laterality: N/A;   ESOPHAGOGASTRODUODENOSCOPY  2002, 11/15/07   Normal (Dr. Raynette)   ESOPHAGOGASTRODUODENOSCOPY (EGD) WITH PROPOFOL  N/A 05/18/2019   Procedure: ESOPHAGOGASTRODUODENOSCOPY (EGD) WITH PROPOFOL ;  Surgeon: Janalyn Keene NOVAK, MD;  Location: [facility] ENDOSCOPY;  Service: Endoscopy;  Laterality: N/A;   ESOPHAGOGASTRODUODENOSCOPY (EGD) WITH PROPOFOL  N/A 08/15/2019   Procedure:  ESOPHAGOGASTRODUODENOSCOPY (EGD) WITH PROPOFOL ;  Surgeon: Janalyn Keene NOVAK, MD;  Location: [facility] ENDOSCOPY;  Service: Endoscopy;  Laterality: N/A;   ESOPHAGOGASTRODUODENOSCOPY (EGD) WITH PROPOFOL  N/A 04/17/2023   Procedure: ESOPHAGOGASTRODUODENOSCOPY (EGD) WITH PROPOFOL ;  Surgeon: Unk Corinn Skiff, MD;  Location: [facility] ENDOSCOPY;  Service: Gastroenterology;  Laterality: N/A;   EYE SURGERY     FACIAL COSMETIC SURGERY  01/13/2010   mini facelift   HERNIA REPAIR  1976   INSERTION OF MESH  07/06/2022   Procedure: INSERTION OF MESH;  Surgeon: Rodolph Romano, MD;  Location: [facility] ORS;  Service: General;;   OOPHORECTOMY     PTOSIS REPAIR  12/08/2021   THORACOTOMY/LOBECTOMY Right 10/27/2015   Procedure: THORACOTOMY/LOBECTOMY;  Surgeon: Evalene Glasser, MD;  Location: [facility] ORS;  Service: Thoracic;  Laterality: Right;   THYROIDECTOMY N/A 12/29/2015   Procedure: THYROIDECTOMY;  Surgeon: Chinita Hasten, MD;  Location: [facility] ORS;  Service: ENT;  Laterality: N/A;   TOTAL VAGINAL HYSTERECTOMY  12/30/2008   for fibroid pain (Dr. Evangeline)   XI ROBOTIC ASSISTED SMALL BOWEL RESECTION N/A 06/28/2022   Procedure: XI ROBOTIC ASSISTED SMALL BOWEL RESECTION;  Surgeon: Rodolph Romano, MD;  Location: [facility] ORS;  Service: General;  Laterality: N/A;    FAMILY HISTORY :   Family History  Problem Relation Age of Onset   Transient ischemic attack Mother    Hypertension Mother    Stroke Mother        mini strokes   Hypertension Sister    Cancer Sister  ovarian   Hypertension Sister    Hypertension Sister    Hypertension Sister    Breast cancer Neg Hx    Colon cancer Neg Hx     SOCIAL HISTORY:   Social History   Tobacco Use   Smoking status: Never   Smokeless tobacco: Never  Vaping Use   Vaping status: Never Used  Substance Use Topics   Alcohol use: No    Alcohol/week: 0.0 standard drinks of alcohol   Drug use: No    ALLERGIES:  is allergic to atorvastatin, ciprofloxacin,  epinephrine , metoprolol  succinate, nortriptyline, simvastatin, and sulfa antibiotics.  MEDICATIONS:  Current Outpatient Medications  Medication Sig Dispense Refill   aspirin  EC 81 MG tablet Take 1 tablet (81 mg total) by mouth daily.     Biotin  1000 MCG tablet Take 1,000 mcg by mouth in the morning and at bedtime.     cholecalciferol (VITAMIN D3) 25 MCG (1000 UNIT) tablet Take 1,000 Units by mouth daily.     Coenzyme Q10 200 MG capsule Take 100 mg by mouth daily.     levothyroxine  (SYNTHROID ) 75 MCG tablet 1 pill a day except for 2 pills on Sundays. 105 tablet 1   multivitamin-lutein  (OCUVITE-LUTEIN ) CAPS capsule Take 1 capsule by mouth daily.     Omega-3 Fatty Acids (FISH OIL) 1000 MG CAPS Take 1 capsule by mouth daily.     pantoprazole  (PROTONIX ) 40 MG tablet Take 1 tablet (40 mg total) by mouth daily. 90 tablet 3   Potassium Gluconate 595 MG CAPS Take 595 mg by mouth daily.      pravastatin  (PRAVACHOL ) 40 MG tablet TAKE 1 TABLET BY MOUTH EVERY DAY 90 tablet 3   No current facility-administered medications for this visit.    PHYSICAL EXAMINATION: ECOG PERFORMANCE STATUS: 1 - Symptomatic but completely ambulatory  BP 115/61 (BP Location: Left Arm, Patient Position: Sitting)   Pulse 73   Temp 98 F (36.7 C) (Tympanic)   Resp 18   SpO2 99%   There were no vitals filed for this visit.       Physical Exam HENT:     Head: Normocephalic and atraumatic.     Mouth/Throat:     Pharynx: No oropharyngeal exudate.  Eyes:     Pupils: Pupils are equal, round, and reactive to light.  Cardiovascular:     Rate and Rhythm: Normal rate and regular rhythm.  Pulmonary:     Effort: No respiratory distress.     Breath sounds: No wheezing.  Abdominal:     General: Bowel sounds are normal. There is no distension.     Palpations: Abdomen is soft. There is no mass.     Tenderness: There is no abdominal tenderness. There is no guarding or rebound.  Musculoskeletal:        General: No  tenderness. Normal range of motion.     Cervical back: Normal range of motion and neck supple.  Skin:    General: Skin is warm.  Neurological:     Mental Status: She is alert and oriented to person, place, and time.  Psychiatric:        Mood and Affect: Affect normal.    LABORATORY DATA:  I have reviewed the data as listed    Component Value Date/Time   NA 137 11/15/2023 0928   NA 141 07/01/2013 0103   K 4.1 11/15/2023 0928   K 3.8 07/01/2013 0103   CL 100 11/15/2023 0928   CL 110 (H) 07/01/2013 0103  CO2 30 11/15/2023 0928   CO2 27 07/01/2013 0103   GLUCOSE 89 11/15/2023 0928   GLUCOSE 88 07/01/2013 0103   BUN 17 11/15/2023 0928   BUN 11 07/01/2013 0103   CREATININE 0.63 11/15/2023 0928   CREATININE 0.68 07/01/2013 0103   CALCIUM  9.2 11/15/2023 0928   CALCIUM  8.2 (L) 07/01/2013 0103   PROT 6.6 11/15/2023 0928   PROT 5.5 (L) 07/01/2013 0103   ALBUMIN 4.3 11/15/2023 0928   ALBUMIN 3.0 (L) 07/01/2013 0103   AST 26 11/15/2023 0928   ALT 26 11/15/2023 0928   ALT 24 07/01/2013 0103   ALKPHOS 47 11/15/2023 0928   ALKPHOS 55 07/01/2013 0103   BILITOT 0.8 11/15/2023 0928   GFRNONAA >60 11/15/2023 0928   GFRNONAA >60 07/01/2013 0103   GFRAA >60 05/19/2019 0117   GFRAA >60 07/01/2013 0103    No results found for: SPEP, UPEP  Lab Results  Component Value Date   WBC 6.6 11/15/2023   NEUTROABS 4.3 11/15/2023   HGB 12.8 11/15/2023   HCT 39.5 11/15/2023   MCV 95.9 11/15/2023   PLT 158 11/15/2023      Chemistry      Component Value Date/Time   NA 137 11/15/2023 0928   NA 141 07/01/2013 0103   K 4.1 11/15/2023 0928   K 3.8 07/01/2013 0103   CL 100 11/15/2023 0928   CL 110 (H) 07/01/2013 0103   CO2 30 11/15/2023 0928   CO2 27 07/01/2013 0103   BUN 17 11/15/2023 0928   BUN 11 07/01/2013 0103   CREATININE 0.63 11/15/2023 0928   CREATININE 0.68 07/01/2013 0103      Component Value Date/Time   CALCIUM  9.2 11/15/2023 0928   CALCIUM  8.2 (L) 07/01/2013 0103    ALKPHOS 47 11/15/2023 0928   ALKPHOS 55 07/01/2013 0103   AST 26 11/15/2023 0928   ALT 26 11/15/2023 0928   ALT 24 07/01/2013 0103   BILITOT 0.8 11/15/2023 0928       RADIOGRAPHIC STUDIES: I have personally reviewed the radiological images as listed and agreed with the findings in the report. No results found.   ASSESSMENT & PLAN:  Cancer of lower lobe of right lung (HCC) # RLL Lung ca; Stage I;  no adjuvant therapy.  JULY 27th 2024- CT - Stable.    # RUL-groundglass opacity JUNE 2023- CT scan: RUL  Progressive enlargement of a ground-glass density right upper lobe nodule over the last 2.5 years, suspicious for low-grade adenocarcinoma.  JAN 2025- . Stable surgical changes from a right lower lobe lobectomy; Improved left lower lobe process with some residual vague branching nodularity and small airspace nodules. This is likely a clearing inflammatory or atypical infectious process; Stable borderline enlarged mediastinal and hilar lymph nodes. No new or progressive findings; Stable small bilateral pulmonary nodules and ground-glass opacities. Recommend continued surveillance.will order CT scan in 6 months.   # Thyroid  cancer [FEB 2017] status post thyroidectomy low risk stage I; on Synthroid  75 mcg. FEB 2023-thyroid  ultrasound no evidence of recurrence. TSH elevated- followed by PCP. JAN 2025- thyroglobulin <2.   # Jan 2021-  BMD- Osteopenia- T score=- 2.0; improved from 4 years ago; ca+vit D BID-stable. The BMD measured at [facility] L1-L3 is 0.934 g/cm2 with a T-score of -2.0. awaiting prolia as per PCP.   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  Monitor for now    # DISPOSITION:   # follow up in 6 months-MD;]cbc/cmp/ thyroid  profile/ Thyroglobulin/and thyroglobulin  antibodies-3 week prior];CT chest with contrast prior- - Dr.B   # I reviewed the blood work- with the patient in detail; also reviewed the imaging independently [as  summarized above]; and with the patient in detail.  Spoke to patient regarding results of the CT scan.  Plan is to follow-up again in 6 months.     Orders Placed This Encounter  Procedures   CT CHEST W CONTRAST    Standing Status:   Future    Expected Date:   06/10/2024    Expiration Date:   12/11/2024    If indicated for the ordered procedure, I authorize the administration of contrast media per Radiology protocol:   Yes    Preferred imaging location?:   [facility]   All questions were answered. The patient knows to call the clinic with any problems, questions or concerns.      Cindy JONELLE Joe, MD 12/12/2023 3:23 PM

## 2023-12-06 NOTE — Assessment & Plan Note (Addendum)
#   RLL Lung ca; Stage I;  no adjuvant therapy.  JULY 27th 2024- CT - Stable.    # RUL-groundglass opacity JUNE 2023- CT scan: RUL  Progressive enlargement of a ground-glass density right upper lobe nodule over the last 2.5 years, suspicious for low-grade adenocarcinoma.  JAN 2025- . Stable surgical changes from a right lower lobe lobectomy; Improved left lower lobe process with some residual vague branching nodularity and small airspace nodules. This is likely a clearing inflammatory or atypical infectious process; Stable borderline enlarged mediastinal and hilar lymph nodes. No new or progressive findings; Stable small bilateral pulmonary nodules and ground-glass opacities. Recommend continued surveillance.will order CT scan in 6 months.   # Thyroid  cancer [FEB 2017] status post thyroidectomy low risk stage I; on Synthroid  75 mcg. FEB 2023-thyroid  ultrasound no evidence of recurrence. TSH elevated- followed by PCP. JAN 2025- thyroglobulin <2.   # Jan 2021-  BMD- Osteopenia- T score=- 2.0; improved from 4 years ago; ca+vit D BID-stable. The BMD measured at [facility] L1-L3 is 0.934 g/cm2 with a T-score of -2.0. awaiting prolia as per PCP.   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  Monitor for now    # DISPOSITION:   # follow up in 6 months-MD;]cbc/cmp/ thyroid  profile/ Thyroglobulin/and thyroglobulin antibodies-3 week prior];CT chest with contrast prior- - Dr.B   # I reviewed the blood work- with the patient in detail; also reviewed the imaging independently [as summarized above]; and with the patient in detail.  Spoke to patient regarding results of the CT scan.  Plan is to follow-up again in 6 months.

## 2023-12-11 ENCOUNTER — Telehealth: Payer: Self-pay | Admitting: *Deleted

## 2023-12-11 NOTE — Telephone Encounter (Signed)
 The patient called and said she said that she can see her CT scan on MyChart and she was told by Dr. B that they would be able to set her up for an appointment so that you can go over it with her.  She is waiting for the appointment.

## 2023-12-12 ENCOUNTER — Telehealth: Payer: Self-pay | Admitting: Internal Medicine

## 2023-12-12 ENCOUNTER — Telehealth: Payer: Self-pay | Admitting: *Deleted

## 2023-12-12 ENCOUNTER — Other Ambulatory Visit: Payer: Self-pay | Admitting: *Deleted

## 2023-12-12 DIAGNOSIS — C3431 Malignant neoplasm of lower lobe, right bronchus or lung: Secondary | ICD-10-CM

## 2023-12-12 DIAGNOSIS — C73 Malignant neoplasm of thyroid gland: Secondary | ICD-10-CM

## 2023-12-12 NOTE — Telephone Encounter (Signed)
Per chat- follow up in 6 months-MD;]cbc/cmp/ thyroid profile/ Thyroglobulin/and thyroglobulin antibodies-3 week prior];2 weeks prior-CT chest with contrast prior- - Dr.B   Appointment scheduled as requested and verified with patient

## 2023-12-12 NOTE — Telephone Encounter (Signed)
Dr. B to call later.

## 2023-12-12 NOTE — Telephone Encounter (Signed)
The patient called today saying that Dr. B said when he got the results of the scan he would be looking at and getting you an appointment scheduled to go over the information.  The patient says that she sees the results and she is waiting to hear back from Dr. Donneta Romberg

## 2023-12-19 DIAGNOSIS — M2011 Hallux valgus (acquired), right foot: Secondary | ICD-10-CM | POA: Diagnosis not present

## 2023-12-19 DIAGNOSIS — M2041 Other hammer toe(s) (acquired), right foot: Secondary | ICD-10-CM | POA: Diagnosis not present

## 2024-01-09 ENCOUNTER — Ambulatory Visit (INDEPENDENT_AMBULATORY_CARE_PROVIDER_SITE_OTHER): Admitting: Family Medicine

## 2024-01-09 ENCOUNTER — Encounter: Payer: Self-pay | Admitting: Family Medicine

## 2024-01-09 VITALS — BP 128/72 | HR 72 | Temp 97.8°F | Ht 64.0 in | Wt 126.6 lb

## 2024-01-09 DIAGNOSIS — R519 Headache, unspecified: Secondary | ICD-10-CM | POA: Diagnosis not present

## 2024-01-09 DIAGNOSIS — E039 Hypothyroidism, unspecified: Secondary | ICD-10-CM

## 2024-01-09 DIAGNOSIS — M79643 Pain in unspecified hand: Secondary | ICD-10-CM | POA: Diagnosis not present

## 2024-01-09 LAB — TSH: TSH: 7.49 u[IU]/mL — ABNORMAL HIGH (ref 0.35–5.50)

## 2024-01-09 NOTE — Progress Notes (Unsigned)
 TSH 17--->5 after dose change.  Still continued on 8 tabs per week.  No neck mass.    HA were getting better as of 12/2023, then returned in the meantime.  Can be variable- diffuse, frontal, posterior.  Some B jaw pain/pressure near TMJ but not pain with jaw ROM.  Woke up yesterday with AM, can have early AM HA vs later in the day. No pain chewing.   She had h/o pain between the shoulder blades with neg w/u prev.  D/w pt about prev imaging and GERD.  She is taking PPI at night to keep it separate from thyroid medicine.  Prev CT chest reviewed with patient. Stopping pravastatin didn't change sx.    She is concerned about both HA and the back pain.    She fell, FOOSHed on B hands.  This was early in 09/2023.  H/o carpal tunnel surgery in distant past.  Using a brace in the meantime helped some.  R>L hand sx.  She is R handed.  No dorsal sx.    Meds, vitals, and allergies reviewed.   ROS: Per HPI unless specifically indicated in ROS section   Nad Ncat Neck supple no LA Rrr Ctab Abd soft, not ttp CN2-12 wnl R wrist tinel positive.   Normal B radial pulses and grip.   35 minutes were devoted to patient care in this encounter (this includes time spent reviewing the patient's file/history, interviewing and examining the patient, counseling/reviewing plan with patient).

## 2024-01-09 NOTE — Patient Instructions (Addendum)
 Go to the lab on the way out.   If you have mychart we'll likely use that to update you.    If your TSH is persistently abnormal, then we can adjust your dose.   Try using a brace at night and see if that helps your hand.  If not, then let me know.    Try tylenol as needed for pain  Ask the dentist about TMJ pain.

## 2024-01-10 DIAGNOSIS — M79643 Pain in unspecified hand: Secondary | ICD-10-CM | POA: Insufficient documentation

## 2024-01-10 NOTE — Assessment & Plan Note (Signed)
 Concern for reaggravation of carpal tunnel symptoms with previous fall.  Discussed using a brace at night to see if that would help.

## 2024-01-10 NOTE — Assessment & Plan Note (Signed)
 Compliant with replacement.  See notes on recheck TSH.  Continue levothyroxine 75 mcg as is for now, 1 pill a day except for 2 pills on Sundays.

## 2024-01-10 NOTE — Assessment & Plan Note (Signed)
 Discussed options.  She can try tylenol as needed for pain.  She can ask the dentist about TMJ pain, if that could be contributing.

## 2024-01-14 ENCOUNTER — Encounter: Payer: Self-pay | Admitting: Family Medicine

## 2024-01-14 ENCOUNTER — Other Ambulatory Visit: Payer: Self-pay | Admitting: Family Medicine

## 2024-01-14 DIAGNOSIS — E039 Hypothyroidism, unspecified: Secondary | ICD-10-CM

## 2024-01-14 MED ORDER — LEVOTHYROXINE SODIUM 100 MCG PO TABS: ORAL_TABLET | ORAL | 3 refills | Status: AC

## 2024-01-17 ENCOUNTER — Other Ambulatory Visit: Payer: Self-pay

## 2024-01-17 ENCOUNTER — Ambulatory Visit
Admission: RE | Admit: 2024-01-17 | Discharge: 2024-01-17 | Disposition: A | Discharge status: Home / Self Care | Source: Ambulatory Visit | Attending: Emergency Medicine | Admitting: Emergency Medicine

## 2024-01-17 VITALS — BP 171/91 | HR 78 | Temp 99.0°F | Resp 18

## 2024-01-17 DIAGNOSIS — J069 Acute upper respiratory infection, unspecified: Secondary | ICD-10-CM

## 2024-01-17 LAB — POCT RAPID STREP A (OFFICE): Rapid Strep A Screen: NEGATIVE

## 2024-01-17 MED ORDER — AZITHROMYCIN 250 MG PO TABS: 250.0000 mg | ORAL_TABLET | Freq: Every day | ORAL | 0 refills | Status: DC

## 2024-01-17 NOTE — ED Triage Notes (Signed)
 Patient presents to [facility] for evaluation after waking up Sunday and feeling like her voice was hoarse.  Some itchy throat, but no pain at that time.  The last few days, when she wakes up, she can barely talk, but after a cough drop and some warm coffee, it gets somewhat better.  Throat is sore, but not terrible.  Some cough, but not much.  Feels like she has a lump in her throat, but it clears and then develops again

## 2024-01-17 NOTE — ED Provider Notes (Signed)
[name]    CSN: [id number] Arrival date & time: 01/17/24  1323      History   Chief Complaint Chief Complaint  Patient presents with   Sore Throat    hoarse - Entered by patient   Hoarse    HPI [name] is a 78 y.o. female.   Patient presents for evaluation of nasal congestion with clear drainage present for 4 weeks, believed to be allergies, over the last 2 to 3 days has begun to experience a sore throat, hoarseness a sensation of a " knot" in the throat, lymph node swelling and a cough.  Symptoms are worse in the morning, making it difficult to speak. no new sick contacts.  Tolerating food and liquids.  Has attempted use of Tylenol.  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  Could be due to hernia or old scar tissue.  prev with CT done w/o alarming findings.  Will treat episodically.  Use tylenol #3 prn.     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   Dysphagia 03/22/2023   History of colonic polyps 11/04/2022   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 07/06/2022   Intussusception of intestine (HCC) 06/28/2022   Lung nodule 05/01/2022   Jaw pain 04/17/2022   Dysuria 03/09/2022   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   Varicose veins of bilateral lower extremities with other complications 01/31/2022   Leg weakness, bilateral 01/31/2022   Vomiting without nausea 01/31/2022   Vitamin D  deficiency 01/31/2022   Burning tongue 01/31/2022   Tightness of neck 01/31/2022   Headache 10/06/2021   High vitamin D level 07/05/2021   Hypothyroidism, unspecified 07/02/2020   Wart 07/02/2020   Dermatitis 02/20/2020   Polyp of transverse colon    Gastric polyp    Stomach irritation    Healthcare maintenance 06/30/2019   Back pain 04/14/2018   Aortic atherosclerosis (HCC) 05/31/2017   Cancer of lower lobe of right lung (HCC) 05/06/2016   Thyroid cancer (HCC) 09/11/2015   Advance care planning 05/14/2014   Other specified counseling 05/14/2014   Thrombocytopenia (HCC) 07/03/2013   Chest pain 07/03/2013   Osteopenia 05/05/2013   GERD (gastroesophageal reflux disease) 04/26/2012   Abdominal pain 01/12/2012   Essential hypertension 11/17/2010   HLD (hyperlipidemia) 01/31/2008   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    Past Surgical History:  Procedure Laterality Date   ABDOMINAL HYSTERECTOMY     BIOPSY  04/17/2023   Procedure: BIOPSY;  Surgeon: Toney Reil, MD;  Location: [facility];  Service: Gastroenterology;;   BREAST EXCISIONAL BIOPSY Right 1977   BREAST MASS EXCISION  1977   benign   BREAST SURGERY     CARPAL TUNNEL RELEASE Right 1978   CATARACT EXTRACTION, BILATERAL Bilateral    CHOLECYSTECTOMY  11/23/2016   Procedure: LAPAROSCOPIC CHOLECYSTECTOMY;  Surgeon: Ricarda Frame, MD;  Location: [facility];  Service: General;;   COLONOSCOPY  2009   COLONOSCOPY WITH PROPOFOL N/A 05/18/2019   Procedure: COLONOSCOPY WITH PROPOFOL;  Surgeon: Pasty Spillers, MD;  Location: [facility];  Service: Endoscopy;  Laterality: N/A;   COLONOSCOPY WITH PROPOFOL N/A 08/15/2019   Procedure: COLONOSCOPY WITH PROPOFOL;  Surgeon: Pasty Spillers, MD;  Location: [facility];  Service: Endoscopy;  Laterality: N/A;   COLONOSCOPY WITH PROPOFOL N/A 11/04/2022   Procedure: COLONOSCOPY WITH PROPOFOL;  Surgeon: Toney Reil, MD;  Location: [facility]  ENDOSCOPY;  Service: Gastroenterology;  Laterality: N/A;   DILATION AND CURETTAGE OF UTERUS  2001   ELECTROMAGNETIC NAVIGATION BROCHOSCOPY N/A 08/25/2015   Procedure: ELECTROMAGNETIC NAVIGATION BRONCHOSCOPY;  Surgeon: Erin Fulling, MD;  Location: [facility] OR[facility];  Service: Cardiopulmonary;  Laterality: N/A;   ENDOBRONCHIAL ULTRASOUND N/A 08/25/2015   Procedure: ENDOBRONCHIAL ULTRASOUND;  Surgeon: Erin Fulling, MD;  Location: [facility];  Service: Cardiopulmonary;  Laterality: N/A;   ESOPHAGOGASTRODUODENOSCOPY  2002, 11/15/07   Normal (Dr. Troy Sine)   ESOPHAGOGASTRODUODENOSCOPY (EGD) WITH PROPOFOL N/A 05/18/2019   Procedure: ESOPHAGOGASTRODUODENOSCOPY (EGD) WITH PROPOFOL;  Surgeon: Pasty Spillers, MD;  Location: [facility];  Service: Endoscopy;  Laterality: N/A;   ESOPHAGOGASTRODUODENOSCOPY (EGD) WITH PROPOFOL N/A 08/15/2019   Procedure: ESOPHAGOGASTRODUODENOSCOPY (EGD) WITH PROPOFOL;  Surgeon: Pasty Spillers, MD;  Location: [facility];  Service: Endoscopy;  Laterality: N/A;   ESOPHAGOGASTRODUODENOSCOPY (EGD) WITH PROPOFOL N/A 04/17/2023   Procedure: ESOPHAGOGASTRODUODENOSCOPY (EGD) WITH PROPOFOL;  Surgeon: Toney Reil, MD;  Location: [facility] ENDOSCOPY;  Service: Gastroenterology;  Laterality: N/A;   EYE SURGERY     FACIAL COSMETIC SURGERY  01/13/2010   mini facelift   HERNIA REPAIR  1976   INSERTION OF MESH  07/06/2022   Procedure: INSERTION OF MESH;  Surgeon: Carolan Shiver, MD;  Location: [facility];  Service: General;;   OOPHORECTOMY     PTOSIS REPAIR  12/08/2021   THORACOTOMY/LOBECTOMY Right 10/27/2015   Procedure: THORACOTOMY/LOBECTOMY;  Surgeon: Hulda Marin, MD;  Location: [facility];  Service: Thoracic;  Laterality: Right;   THYROIDECTOMY N/A 12/29/2015   Procedure: THYROIDECTOMY;  Surgeon: Linus Salmons, MD;  Location: [facility];  Service: ENT;  Laterality: N/A;   TOTAL VAGINAL HYSTERECTOMY  12/30/2008   for fibroid pain (Dr. Huntley Dec)   XI ROBOTIC ASSISTED SMALL  BOWEL RESECTION N/A 06/28/2022   Procedure: XI ROBOTIC ASSISTED SMALL BOWEL RESECTION;  Surgeon: Carolan Shiver, MD;  Location: [facility];  Service: General;  Laterality: N/A;    OB History     Gravida  0   Para  0   Term  0   Preterm  0   AB  0   Living  0      SAB  0   IAB  0   Ectopic  0   Multiple  0   Live Births           Obstetric Comments  1st Menstrual Cycle:  10 1st Pregnancy:  1          Home Medications    Prior to Admission medications   Medication Sig Start Date End Date Taking? Authorizing Provider  azithromycin (ZITHROMAX) 250 MG tablet Take 1 tablet (250 mg total) by mouth daily. Take first 2 tablets together, then 1 every day until finished. 01/17/24  Yes Uziel Covault, Elita Boone, NP  acetaminophen (TYLENOL) 325 MG tablet Take 325-650 mg by mouth in the morning and at bedtime. As needed for pain.    [provider]  aspirin EC 81 MG tablet Take 1 tablet (81 mg total) by mouth daily. 11/29/19   Joaquim Nam, MD  Biotin 1000 MCG tablet Take 1,000 mcg by mouth in the morning and at bedtime.    [provider]  cholecalciferol (VITAMIN D3) 25 MCG (1000 UNIT) tablet Take 1,000 Units by mouth daily.    [provider]  Coenzyme Q10 200 MG capsule Take 100 mg by mouth daily.    [provider]  levothyroxine (SYNTHROID) 100 MCG tablet 1 pill a day except for 0.5 pills on Sundays. 6.5 tabs per week. 01/14/24   Joaquim Nam, MD  multivitamin-lutein [facility]) CAPS capsule Take 1 capsule by mouth daily.    [provider]  Omega-3 Fatty Acids (FISH OIL) 1000 MG CAPS Take 1 capsule by mouth daily.    [provider]  pantoprazole (PROTONIX) 40 MG tablet Take 1 tablet (40 mg total) by mouth daily. 12/05/23   Joaquim Nam, MD  Potassium Gluconate 595 MG CAPS Take 595 mg by mouth daily.     [provider]  pravastatin (PRAVACHOL) 40 MG tablet TAKE 1 TABLET BY MOUTH EVERY DAY 05/01/23    Joaquim Nam, MD    Family History Family History  Problem Relation Age of Onset   Transient ischemic attack Mother    Hypertension Mother    Stroke Mother        mini strokes   Hypertension Sister    Cancer Sister        ovarian   Hypertension Sister    Hypertension Sister    Hypertension Sister    Breast cancer Neg Hx    Colon cancer Neg Hx     Social History Social History   Tobacco Use   Smoking status: Never   Smokeless tobacco: Never  Vaping Use   Vaping status: Never Used  Substance Use Topics   Alcohol use: No    Alcohol/week: 0.0 standard drinks of alcohol   Drug use: No     Allergies   Atorvastatin, Ciprofloxacin, Epinephrine, Metoprolol succinate, Nortriptyline, Simvastatin, and Sulfa antibiotics   Review of Systems Review of Systems   Physical Exam Triage Vital Signs ED Triage Vitals  Encounter Vitals Group     BP 01/17/24 1344 (!) 171/91     Systolic BP Percentile --      Diastolic BP Percentile --      Pulse Rate 01/17/24 1344 78     Resp 01/17/24 1344 18     Temp 01/17/24 1344 99 F (37.2 C)     Temp Source 01/17/24 1344 Oral     SpO2 01/17/24 1344 96 %     Weight --      Height --      Head Circumference --      Peak Flow --      Pain Score 01/17/24 1345 4     Pain Loc --      Pain Education --      Exclude from Growth Chart --    No data found.  Updated Vital Signs BP (!) 171/91 (BP Location: Left Arm)   Pulse 78   Temp 99 F (37.2 C) (Oral)   Resp 18   SpO2 96%   Visual Acuity Right Eye Distance:   Left Eye Distance:   Bilateral Distance:    Right Eye Near:   Left Eye Near:    Bilateral Near:     Physical Exam  Constitutional:      Appearance: Normal appearance.  HENT:     Head: Normocephalic.     Right Ear: Tympanic membrane, ear canal and external ear normal.     Left Ear: Tympanic membrane, ear canal and external ear normal.     Nose: Congestion present.     Mouth/Throat:     Pharynx: Posterior  oropharyngeal erythema present. No oropharyngeal exudate.  Cardiovascular:     Rate and Rhythm: Normal rate and regular rhythm.     Pulses: Normal pulses.     Heart sounds: Normal heart sounds.  Pulmonary:     Effort: Pulmonary effort is normal.     Breath sounds: Normal breath sounds.  Neurological:     Mental Status: She is alert and oriented to person, place, and time. Mental status is at baseline.      UC Treatments / Results  Labs (all labs ordered are listed, but only abnormal results are displayed) Labs Reviewed  POCT RAPID STREP A (OFFICE) - Normal    EKG   Radiology No results found.  Procedures Procedures (including critical care time)  Medications Ordered in UC Medications - No data to display  Initial Impression / Assessment and Plan / UC Course  I have reviewed the triage vital signs and the nursing notes.  Pertinent labs & imaging results that were available during my care of the patient were reviewed by me and considered in my medical decision making (see chart for details).  Acute URI  Patient is in no signs of distress nor toxic appearing.  Vital signs are stable.  Low suspicion for pneumonia, pneumothorax or bronchitis and therefore will defer imaging.  Strep test negative.  Most likely seasonal allergies however new symptoms after 4 weeks therefore we will provide bacterial coverage, azithromycin prescribed. May use additional over-the-counter medications as needed for supportive care.  May follow-up with urgent care as needed if symptoms persist or worsen.  Final Clinical Impressions(s) / UC Diagnoses   Final diagnoses:  Acute URI     Discharge Instructions      Strep test is negative,   begin azithromycin prophylactically for bacterial coverage    You can take Tylenol and/or Ibuprofen as needed for fever reduction and pain relief.   For cough: honey 1/2 to 1 teaspoon (you can dilute the honey in water or another fluid).  You can also use  guaifenesin and dextromethorphan for cough. You can use a humidifier for chest congestion and cough.  If you don't have a humidifier, you can sit in the bathroom with the hot shower running.      For sore throat: try warm salt water gargles, cepacol lozenges, throat spray, warm tea or water with lemon/honey, popsicles or ice, or OTC cold relief medicine for throat discomfort.   For congestion: take a daily anti-histamine like Zyrtec, Claritin, and a oral decongestant, such as pseudoephedrine.  You can also use Flonase 1-2 sprays in each nostril daily.   It is important to stay hydrated: drink plenty of fluids (water, gatorade/powerade/pedialyte, juices, or teas) to keep your throat moisturized and help further relieve irritation/discomfort.    ED Prescriptions     Medication Sig Dispense Auth. Provider   azithromycin (ZITHROMAX) 250 MG tablet Take 1 tablet (250 mg total) by mouth daily. Take first 2 tablets together, then 1 every day until finished. 6 tablet Valinda Hoar, NP      PDMP not reviewed this encounter.   Valinda Hoar, NP 01/17/24  1444  

## 2024-01-17 NOTE — Discharge Instructions (Signed)
 Strep test is negative,   begin azithromycin prophylactically for bacterial coverage    You can take Tylenol and/or Ibuprofen as needed for fever reduction and pain relief.   For cough: honey 1/2 to 1 teaspoon (you can dilute the honey in water or another fluid).  You can also use guaifenesin and dextromethorphan for cough. You can use a humidifier for chest congestion and cough.  If you don't have a humidifier, you can sit in the bathroom with the hot shower running.      For sore throat: try warm salt water gargles, cepacol lozenges, throat spray, warm tea or water with lemon/honey, popsicles or ice, or OTC cold relief medicine for throat discomfort.   For congestion: take a daily anti-histamine like Zyrtec, Claritin, and a oral decongestant, such as pseudoephedrine.  You can also use Flonase 1-2 sprays in each nostril daily.   It is important to stay hydrated: drink plenty of fluids (water, gatorade/powerade/pedialyte, juices, or teas) to keep your throat moisturized and help further relieve irritation/discomfort.

## 2024-01-24 ENCOUNTER — Other Ambulatory Visit: Payer: Self-pay | Admitting: Family Medicine

## 2024-01-24 DIAGNOSIS — Z1231 Encounter for screening mammogram for malignant neoplasm of breast: Secondary | ICD-10-CM

## 2024-02-01 ENCOUNTER — Encounter: Payer: Self-pay | Admitting: Family Medicine

## 2024-02-01 ENCOUNTER — Ambulatory Visit: Admitting: Family Medicine

## 2024-02-01 VITALS — BP 134/74 | HR 75 | Temp 98.2°F | Ht 64.0 in | Wt 126.2 lb

## 2024-02-01 DIAGNOSIS — G5139 Clonic hemifacial spasm, unspecified: Secondary | ICD-10-CM | POA: Diagnosis not present

## 2024-02-01 MED ORDER — DIAZEPAM 2 MG PO TABS: 1.0000 mg | ORAL_TABLET | Freq: Two times a day (BID) | ORAL | 0 refills | Status: DC | PRN

## 2024-02-01 NOTE — Progress Notes (Signed)
 She had prev dental eval without pathology seen.  She was going to try a mouthguard but that got delayed with a concurrent URI.  Then her jaw pain improved in the meantime.  She didn't use mouthguard yet.  She thought her HAs were getting better overall.    Then she had recent twitching in the L cheek, lower eyelid. No LOC.  Sx were unilateral.  She had some tightness on the L side of the neck.  Still with intermittent L facial twitch today.  Normal voice.  Normal swallowing.  No double vision.  No new facial pain.  No rash.  No hearing changes.    BP was elevated last night.  BP controlled today at OV.  She had higher BP this AM at home.    Meds, vitals, and allergies reviewed.   ROS: Per HPI unless specifically indicated in ROS section   Nad Ncat Neck supple, no LA Rrr Ctab Abd soft, not ttp CN 2-12 wnl B, S/S/DTR wnl x4

## 2024-02-01 NOTE — Patient Instructions (Addendum)
 Go to the lab on the way out.   If you have mychart we'll likely use that to update you.    Let us check with neurology in the meantime and then we'll be in touch.   Take care.  Glad to see you. Valium if needed- sedation caution.

## 2024-02-02 LAB — CBC WITH DIFFERENTIAL/PLATELET
Basophils Absolute: 0 10*3/uL (ref 0.0–0.1)
Basophils Relative: 0.6 % (ref 0.0–3.0)
Eosinophils Absolute: 0.1 10*3/uL (ref 0.0–0.7)
Eosinophils Relative: 1.6 % (ref 0.0–5.0)
HCT: 39 % (ref 36.0–46.0)
Hemoglobin: 13.1 g/dL (ref 12.0–15.0)
Lymphocytes Relative: 26.6 % (ref 12.0–46.0)
Lymphs Abs: 2 10*3/uL (ref 0.7–4.0)
MCHC: 33.7 g/dL (ref 30.0–36.0)
MCV: 93.8 fl (ref 78.0–100.0)
Monocytes Absolute: 0.4 10*3/uL (ref 0.1–1.0)
Monocytes Relative: 5 % (ref 3.0–12.0)
Neutro Abs: 4.8 10*3/uL (ref 1.4–7.7)
Neutrophils Relative %: 66.2 % (ref 43.0–77.0)
Platelets: 171 10*3/uL (ref 150.0–400.0)
RBC: 4.16 Mil/uL (ref 3.87–5.11)
RDW: 15.3 % (ref 11.5–15.5)
WBC: 7.3 10*3/uL (ref 4.0–10.5)

## 2024-02-02 LAB — COMPREHENSIVE METABOLIC PANEL WITH GFR
ALT: 22 U/L (ref 0–35)
AST: 19 U/L (ref 0–37)
Albumin: 4.5 g/dL (ref 3.5–5.2)
Alkaline Phosphatase: 48 U/L (ref 39–117)
BUN: 17 mg/dL (ref 6–23)
CO2: 32 meq/L (ref 19–32)
Calcium: 9.4 mg/dL (ref 8.4–10.5)
Chloride: 101 meq/L (ref 96–112)
Creatinine, Ser: 0.75 mg/dL (ref 0.40–1.20)
GFR: 75.57 mL/min (ref >60.00)
Glucose, Bld: 96 mg/dL (ref 70–99)
Potassium: 4.2 meq/L (ref 3.5–5.1)
Sodium: 141 meq/L (ref 135–145)
Total Bilirubin: 0.5 mg/dL (ref 0.2–1.2)
Total Protein: 6.7 g/dL (ref 6.0–8.3)

## 2024-02-04 ENCOUNTER — Telehealth: Payer: Self-pay | Admitting: Family Medicine

## 2024-02-04 ENCOUNTER — Encounter: Payer: Self-pay | Admitting: Family Medicine

## 2024-02-04 DIAGNOSIS — G5139 Clonic hemifacial spasm, unspecified: Secondary | ICD-10-CM | POA: Insufficient documentation

## 2024-02-04 NOTE — Telephone Encounter (Signed)
 Please call neurology about having them contact the patient for follow-up.  She sees Dr. Daisy Blossom clinic.  I would like them to see her in the near future.  Patient is having left-sided hemifacial spasm.  Thanks.

## 2024-02-04 NOTE — Assessment & Plan Note (Signed)
 Without motor deficit today.  Left-sided hemifacial spasm.  See notes on labs.  I need input from neurology.  Discussed.  At this point still okay for outpatient follow-up.  She could use a low dose of Valium if needed to try to control the symptoms, sedation caution.

## 2024-02-05 NOTE — Telephone Encounter (Signed)
 Reached out to Dr, Malvin Johns office and they were closed will try again tomorrow.[phone])

## 2024-02-06 NOTE — Telephone Encounter (Signed)
 Spoke with Joni Reining at Dr. Geralyn Flash office so that they can reach out and schedule patient to be seen. Patient is aware that she should be awaiting their call.

## 2024-02-09 ENCOUNTER — Encounter: Payer: Self-pay | Admitting: Family Medicine

## 2024-02-11 ENCOUNTER — Other Ambulatory Visit: Payer: Self-pay | Admitting: Family Medicine

## 2024-02-11 MED ORDER — DIAZEPAM 2 MG PO TABS: 1.0000 mg | ORAL_TABLET | Freq: Two times a day (BID) | ORAL | 0 refills | Status: DC | PRN

## 2024-03-06 DIAGNOSIS — G514 Facial myokymia: Secondary | ICD-10-CM | POA: Diagnosis not present

## 2024-03-06 DIAGNOSIS — G5133 Clonic hemifacial spasm, bilateral: Secondary | ICD-10-CM | POA: Diagnosis not present

## 2024-03-06 DIAGNOSIS — R519 Headache, unspecified: Secondary | ICD-10-CM | POA: Diagnosis not present

## 2024-03-06 DIAGNOSIS — M25531 Pain in right wrist: Secondary | ICD-10-CM | POA: Diagnosis not present

## 2024-03-07 ENCOUNTER — Ambulatory Visit
Admission: RE | Admit: 2024-03-07 | Discharge: 2024-03-07 | Disposition: A | Discharge status: Home / Self Care | Source: Ambulatory Visit | Attending: Family Medicine | Admitting: Family Medicine

## 2024-03-07 DIAGNOSIS — Z1231 Encounter for screening mammogram for malignant neoplasm of breast: Secondary | ICD-10-CM | POA: Insufficient documentation

## 2024-03-08 ENCOUNTER — Other Ambulatory Visit: Payer: Self-pay | Admitting: Physician Assistant

## 2024-03-08 DIAGNOSIS — R519 Headache, unspecified: Secondary | ICD-10-CM

## 2024-03-08 DIAGNOSIS — G514 Facial myokymia: Secondary | ICD-10-CM

## 2024-03-08 DIAGNOSIS — G5133 Clonic hemifacial spasm, bilateral: Secondary | ICD-10-CM

## 2024-03-12 DIAGNOSIS — D2371 Other benign neoplasm of skin of right lower limb, including hip: Secondary | ICD-10-CM | POA: Diagnosis not present

## 2024-03-12 DIAGNOSIS — M79671 Pain in right foot: Secondary | ICD-10-CM | POA: Diagnosis not present

## 2024-03-12 DIAGNOSIS — M2011 Hallux valgus (acquired), right foot: Secondary | ICD-10-CM | POA: Diagnosis not present

## 2024-03-12 DIAGNOSIS — M2041 Other hammer toe(s) (acquired), right foot: Secondary | ICD-10-CM | POA: Diagnosis not present

## 2024-03-13 ENCOUNTER — Ambulatory Visit
Admission: RE | Admit: 2024-03-13 | Discharge: 2024-03-13 | Disposition: A | Discharge status: Home / Self Care | Source: Ambulatory Visit | Attending: Physician Assistant | Admitting: Physician Assistant

## 2024-03-13 ENCOUNTER — Ambulatory Visit: Payer: Self-pay | Admitting: Family Medicine

## 2024-03-13 DIAGNOSIS — G319 Degenerative disease of nervous system, unspecified: Secondary | ICD-10-CM | POA: Diagnosis not present

## 2024-03-13 DIAGNOSIS — R519 Headache, unspecified: Secondary | ICD-10-CM

## 2024-03-13 DIAGNOSIS — I6782 Cerebral ischemia: Secondary | ICD-10-CM | POA: Diagnosis not present

## 2024-03-13 DIAGNOSIS — G514 Facial myokymia: Secondary | ICD-10-CM

## 2024-03-13 DIAGNOSIS — G5133 Clonic hemifacial spasm, bilateral: Secondary | ICD-10-CM

## 2024-03-13 MED ORDER — GADOPICLENOL 0.5 MMOL/ML IV SOLN
7.5000 mL | Freq: Once | INTRAVENOUS | Status: AC | PRN
  Administered 2024-03-13: 7.5 mL via INTRAVENOUS

## 2024-03-20 ENCOUNTER — Other Ambulatory Visit

## 2024-03-28 DIAGNOSIS — M1811 Unilateral primary osteoarthritis of first carpometacarpal joint, right hand: Secondary | ICD-10-CM | POA: Diagnosis not present

## 2024-04-01 ENCOUNTER — Encounter: Payer: Self-pay | Admitting: Family Medicine

## 2024-04-01 ENCOUNTER — Encounter: Payer: Self-pay | Admitting: *Deleted

## 2024-04-01 ENCOUNTER — Ambulatory Visit (INDEPENDENT_AMBULATORY_CARE_PROVIDER_SITE_OTHER): Admitting: Family Medicine

## 2024-04-01 VITALS — BP 118/72 | HR 75 | Temp 98.2°F | Ht 64.0 in | Wt 124.2 lb

## 2024-04-01 DIAGNOSIS — R519 Headache, unspecified: Secondary | ICD-10-CM

## 2024-04-01 DIAGNOSIS — M549 Dorsalgia, unspecified: Secondary | ICD-10-CM | POA: Diagnosis not present

## 2024-04-01 DIAGNOSIS — E039 Hypothyroidism, unspecified: Secondary | ICD-10-CM

## 2024-04-01 LAB — TSH: TSH: 4.26 u[IU]/mL (ref 0.35–5.50)

## 2024-04-01 NOTE — Patient Instructions (Addendum)
 Go to the lab on the way out.   If you have mychart we'll likely use that to update you.    Take care.  Glad to see you. Let me know if you don't get a call about the MRI.  You could try increasing gabapentin  to 2 tabs twice a day if tolerated.

## 2024-04-01 NOTE — Progress Notes (Unsigned)
 Taking 100mg  gabapentin  AM and 200mg  PM.  Recent increase in gabapentin  to 200mg  recently, ie the last few nights.   She is off diazepam .  Some twitching but not everyday all day.    She had return of HA in the meantime.  She has neuro f/u pending.  She had reassuring brain MRI.    HA can be frontal vs occ posterior.  She isn't taking caffeine.  HA can come and go, variable duration.  She had eye exam re: glasses, w/o cause found.    Had ortho eval re: wrist pain with cortisone injection.  The shot helped some.    L scapular area pain.  No change with gabapentin  use.  This is long standing.    Recheck TSH pending.   Meds, vitals, and allergies reviewed.   ROS: Per HPI unless specifically indicated in ROS section

## 2024-04-03 ENCOUNTER — Ambulatory Visit: Payer: Self-pay | Admitting: Family Medicine

## 2024-04-03 DIAGNOSIS — M549 Dorsalgia, unspecified: Secondary | ICD-10-CM | POA: Insufficient documentation

## 2024-04-03 NOTE — Assessment & Plan Note (Signed)
 Discussed getting follow-up MRI done.  Ordered.  She could try increasing gabapentin  to 2 tabs twice a day if tolerated.  I do not suspect an ominous issue given her previous imaging.  I suspect she has a benign musculoskeletal source with potential nerve root impingement.

## 2024-04-03 NOTE — Assessment & Plan Note (Signed)
See notes on TSH. 

## 2024-04-03 NOTE — Assessment & Plan Note (Signed)
 She can see if increasing gabapentin  helps her symptoms in the meantime.  Discussed.

## 2024-04-04 ENCOUNTER — Ambulatory Visit
Admission: RE | Admit: 2024-04-04 | Discharge: 2024-04-04 | Disposition: A | Discharge status: Home / Self Care | Source: Ambulatory Visit | Attending: Family Medicine | Admitting: Family Medicine

## 2024-04-04 DIAGNOSIS — M546 Pain in thoracic spine: Secondary | ICD-10-CM | POA: Diagnosis not present

## 2024-04-04 DIAGNOSIS — M549 Dorsalgia, unspecified: Secondary | ICD-10-CM

## 2024-04-25 DIAGNOSIS — G5133 Clonic hemifacial spasm, bilateral: Secondary | ICD-10-CM | POA: Diagnosis not present

## 2024-04-25 DIAGNOSIS — R519 Headache, unspecified: Secondary | ICD-10-CM | POA: Diagnosis not present

## 2024-04-25 DIAGNOSIS — G514 Facial myokymia: Secondary | ICD-10-CM | POA: Diagnosis not present

## 2024-04-28 ENCOUNTER — Ambulatory Visit: Payer: Self-pay | Admitting: Family Medicine

## 2024-04-29 ENCOUNTER — Ambulatory Visit (INDEPENDENT_AMBULATORY_CARE_PROVIDER_SITE_OTHER)
Admission: RE | Admit: 2024-04-29 | Discharge: 2024-04-29 | Disposition: A | Discharge status: Home / Self Care | Source: Ambulatory Visit | Attending: Family Medicine | Admitting: Family Medicine

## 2024-04-29 ENCOUNTER — Encounter: Payer: Self-pay | Admitting: Family Medicine

## 2024-04-29 ENCOUNTER — Telehealth: Payer: Self-pay | Admitting: Family Medicine

## 2024-04-29 ENCOUNTER — Ambulatory Visit (INDEPENDENT_AMBULATORY_CARE_PROVIDER_SITE_OTHER): Admitting: Family Medicine

## 2024-04-29 VITALS — BP 118/64 | HR 70 | Temp 98.2°F | Ht 64.0 in | Wt 125.8 lb

## 2024-04-29 DIAGNOSIS — K219 Gastro-esophageal reflux disease without esophagitis: Secondary | ICD-10-CM

## 2024-04-29 DIAGNOSIS — M79643 Pain in unspecified hand: Secondary | ICD-10-CM

## 2024-04-29 DIAGNOSIS — M549 Dorsalgia, unspecified: Secondary | ICD-10-CM | POA: Diagnosis not present

## 2024-04-29 DIAGNOSIS — M542 Cervicalgia: Secondary | ICD-10-CM

## 2024-04-29 DIAGNOSIS — M5030 Other cervical disc degeneration, unspecified cervical region: Secondary | ICD-10-CM | POA: Diagnosis not present

## 2024-04-29 DIAGNOSIS — M4802 Spinal stenosis, cervical region: Secondary | ICD-10-CM | POA: Diagnosis not present

## 2024-04-29 MED ORDER — PANTOPRAZOLE SODIUM 40 MG PO TBEC: 40.0000 mg | DELAYED_RELEASE_TABLET | Freq: Two times a day (BID) | ORAL | 3 refills | Status: AC

## 2024-04-29 NOTE — Progress Notes (Unsigned)
 HA.  No tylenol  since 03/07/24.  Plan per neuro to stop gabapentin  and try baclofen.  She stopped gabapentin  and had sig R palm pain.  That hand pain has continued.  Tried taking baclofen in the meantime without significant change.  Some days the HA are worse than others.  Frontal HA today.    Still having L shoulder/chest wall pain.  This is a chronic/recurrent issue.  She still has sensation of occ dysphagia. She is still having to take rolaids prn, with occ abd discomfort.    Had thumb injection per ortho, 03/28/24.    She wanted to defer botox for facial spasm at this point, discussed.  Most recent MRI discussed.  The only imaging she has not had done recently would be her C-spine.  Discussed getting plain films today because she continues to have upper back and neck pain along with right hand pain.  Meds, vitals, and allergies reviewed.   ROS: Per HPI unless specifically indicated in ROS section   Nad Ncat Neck supple, no LA Rrr Ctab She is tender on the back medial to the scapula, left greater than right. Grip is intact in the right hand but right wrist Tinel is positive. Abdomen not tender at time of exam. Skin still well-perfused.  35 minutes were devoted to patient care in this encounter (this includes time spent reviewing the patient's file/history, interviewing and examining the patient, counseling/reviewing plan with patient).

## 2024-04-29 NOTE — Patient Instructions (Addendum)
 Let me check your xray and we'll see about restarting gabapentin .  I can update neuro in the meantime.   Try taking pantoprazole  twice a day.

## 2024-04-29 NOTE — Telephone Encounter (Signed)
 Please update patient.  I would still continue as planned with baclofen and protonix .    I looked at her xrays.  No fracture seen but she has some degenerative changes.    I would restart gabapentin  as tolerated (assuming she isn't drowsy with use).  See if that helps with hand pain and headache (in combination with baclofen).   Please route this back to me so I can update neuro when I am done with her clinic note.  Thanks.

## 2024-05-01 NOTE — Telephone Encounter (Signed)
 Karen Rosina Helling, RN to Me (Selected Message) AT    05/01/24 10:06 AM Patient had just increased the Protonix  yesterday, is taking the baclofen at night right now her 1 week will be up on Tuesday of next week and she will increase to BID.  Has a headache at the time of my phone call.  She plans to start back the gabapentin  but will take that in the morning for now and report any drowsiness.

## 2024-05-01 NOTE — Assessment & Plan Note (Signed)
 It would be reasonable to try pantoprazole  twice a day in the meantime.  She has a history of cholecystectomy.  It may be reasonable to consider a trial of a bile acid sequestrant in the future.

## 2024-05-01 NOTE — Assessment & Plan Note (Signed)
 She is also having right hand pain.  It may be reasonable to restart gabapentin .  See notes on plain films.  I will update neurology.

## 2024-05-01 NOTE — Telephone Encounter (Signed)
 See below, please route this note and the last office visit note to neurology as FYI.  I think it makes sense to restart gabapentin  as described below to see if it makes any difference in her hand and back pain.  Thanks.

## 2024-05-05 ENCOUNTER — Ambulatory Visit: Payer: Self-pay | Admitting: Family Medicine

## 2024-05-06 NOTE — Telephone Encounter (Signed)
 sent

## 2024-05-18 ENCOUNTER — Other Ambulatory Visit: Payer: Self-pay | Admitting: Family Medicine

## 2024-05-24 ENCOUNTER — Inpatient Hospital Stay: Payer: PPO | Attending: Internal Medicine

## 2024-05-24 DIAGNOSIS — Z85850 Personal history of malignant neoplasm of thyroid: Secondary | ICD-10-CM | POA: Diagnosis not present

## 2024-05-24 DIAGNOSIS — M8580 Other specified disorders of bone density and structure, unspecified site: Secondary | ICD-10-CM | POA: Diagnosis not present

## 2024-05-24 DIAGNOSIS — H5712 Ocular pain, left eye: Secondary | ICD-10-CM | POA: Diagnosis not present

## 2024-05-24 DIAGNOSIS — D696 Thrombocytopenia, unspecified: Secondary | ICD-10-CM | POA: Diagnosis not present

## 2024-05-24 DIAGNOSIS — Z85118 Personal history of other malignant neoplasm of bronchus and lung: Secondary | ICD-10-CM | POA: Diagnosis not present

## 2024-05-24 DIAGNOSIS — Z79899 Other long term (current) drug therapy: Secondary | ICD-10-CM | POA: Diagnosis not present

## 2024-05-24 DIAGNOSIS — C3431 Malignant neoplasm of lower lobe, right bronchus or lung: Secondary | ICD-10-CM

## 2024-05-24 DIAGNOSIS — C73 Malignant neoplasm of thyroid gland: Secondary | ICD-10-CM

## 2024-05-24 LAB — CMP (CANCER CENTER ONLY)
ALT: 22 U/L (ref 0–44)
AST: 22 U/L (ref 15–41)
Albumin: 4.2 g/dL (ref 3.5–5.0)
Alkaline Phosphatase: 45 U/L (ref 38–126)
Anion gap: 8 (ref 5–15)
BUN: 16 mg/dL (ref 8–23)
CO2: 28 mmol/L (ref 22–32)
Calcium: 8.9 mg/dL (ref 8.9–10.3)
Chloride: 101 mmol/L (ref 98–111)
Creatinine: 0.71 mg/dL (ref 0.44–1.00)
GFR, Estimated: >60 mL/min (ref >60)
Glucose, Bld: 88 mg/dL (ref 70–99)
Potassium: 4.4 mmol/L (ref 3.5–5.1)
Sodium: 137 mmol/L (ref 135–145)
Total Bilirubin: 1 mg/dL (ref 0.0–1.2)
Total Protein: 6.4 g/dL — ABNORMAL LOW (ref 6.5–8.1)

## 2024-05-24 LAB — CBC (CANCER CENTER ONLY)
HCT: 39.1 % (ref 36.0–46.0)
Hemoglobin: 12.7 g/dL (ref 12.0–15.0)
MCH: 31 pg (ref 26.0–34.0)
MCHC: 32.5 g/dL (ref 30.0–36.0)
MCV: 95.4 fL (ref 80.0–100.0)
Platelet Count: 149 K/uL — ABNORMAL LOW (ref 150–400)
RBC: 4.1 MIL/uL (ref 3.87–5.11)
RDW: 14.5 % (ref 11.5–15.5)
WBC Count: 6.3 K/uL (ref 4.0–10.5)
nRBC: 0 % (ref 0.0–0.2)

## 2024-05-25 LAB — THYROID PANEL WITH TSH
Free Thyroxine Index: 3.3 (ref 1.2–4.9)
T3 Uptake Ratio: 34 % (ref 24–39)
T4, Total: 9.8 ug/dL (ref 4.5–12.0)
TSH: 1.96 u[IU]/mL (ref 0.450–4.500)

## 2024-05-26 ENCOUNTER — Encounter: Payer: Self-pay | Admitting: Family Medicine

## 2024-05-29 DIAGNOSIS — S0502XD Injury of conjunctiva and corneal abrasion without foreign body, left eye, subsequent encounter: Secondary | ICD-10-CM | POA: Diagnosis not present

## 2024-05-31 ENCOUNTER — Other Ambulatory Visit: Payer: PPO

## 2024-05-31 ENCOUNTER — Ambulatory Visit
Admission: RE | Admit: 2024-05-31 | Discharge: 2024-05-31 | Disposition: A | Discharge status: Home / Self Care | Payer: PPO | Source: Ambulatory Visit | Attending: Internal Medicine | Admitting: Internal Medicine

## 2024-05-31 DIAGNOSIS — R590 Localized enlarged lymph nodes: Secondary | ICD-10-CM | POA: Diagnosis not present

## 2024-05-31 DIAGNOSIS — C3431 Malignant neoplasm of lower lobe, right bronchus or lung: Secondary | ICD-10-CM | POA: Insufficient documentation

## 2024-05-31 DIAGNOSIS — Z85118 Personal history of other malignant neoplasm of bronchus and lung: Secondary | ICD-10-CM | POA: Diagnosis not present

## 2024-05-31 DIAGNOSIS — R918 Other nonspecific abnormal finding of lung field: Secondary | ICD-10-CM | POA: Diagnosis not present

## 2024-05-31 MED ORDER — IOHEXOL 300 MG/ML  SOLN
75.0000 mL | Freq: Once | INTRAMUSCULAR | Status: AC | PRN
  Administered 2024-05-31: 75 mL via INTRAVENOUS

## 2024-05-31 NOTE — Telephone Encounter (Signed)
 Copied from CRM [id number]. Topic: Clinical - Medication Question >> May 31, 2024  9:47 AM [name] wrote: Reason for CRM:  Patient said she was originally put on gabapentin  100mg  but patient takes 200mg  in the morning and is wondering if Dr. Cleatus can write her a script for gabapentin  again.   [phone] (M)

## 2024-06-02 ENCOUNTER — Other Ambulatory Visit: Payer: Self-pay | Admitting: Family Medicine

## 2024-06-02 MED ORDER — GABAPENTIN 100 MG PO CAPS: 200.0000 mg | ORAL_CAPSULE | Freq: Every day | ORAL | 3 refills | Status: AC

## 2024-06-04 LAB — THYROGLOBULIN BY RIA: Thyroglobulin by RIA: 2.9 ng/mL

## 2024-06-04 LAB — TGAB+THYROGLOBULIN IMA OR RIA: Thyroglobulin Antibody: 8.1 [IU]/mL — ABNORMAL HIGH (ref 0.0–0.9)

## 2024-06-11 ENCOUNTER — Ambulatory Visit
Admission: RE | Admit: 2024-06-11 | Discharge: 2024-06-11 | Disposition: A | Discharge status: Home / Self Care | Source: Ambulatory Visit | Attending: Emergency Medicine | Admitting: Emergency Medicine

## 2024-06-11 VITALS — BP 149/88 | HR 75 | Temp 98.0°F | Resp 18

## 2024-06-11 DIAGNOSIS — Z90710 Acquired absence of both cervix and uterus: Secondary | ICD-10-CM | POA: Diagnosis not present

## 2024-06-11 DIAGNOSIS — R350 Frequency of micturition: Secondary | ICD-10-CM | POA: Diagnosis not present

## 2024-06-11 DIAGNOSIS — R109 Unspecified abdominal pain: Secondary | ICD-10-CM | POA: Diagnosis present

## 2024-06-11 LAB — POCT URINE DIPSTICK
Bilirubin, UA: NEGATIVE
Blood, UA: NEGATIVE
Glucose, UA: NEGATIVE mg/dL
Ketones, POC UA: NEGATIVE mg/dL
Leukocytes, UA: NEGATIVE
Nitrite, UA: NEGATIVE
POC PROTEIN,UA: NEGATIVE
Spec Grav, UA: >=1.03 — AB (ref 1.010–1.025)
Urobilinogen, UA: 0.2 U/dL
pH, UA: 6.5 (ref 5.0–8.0)

## 2024-06-11 MED ORDER — NITROFURANTOIN MONOHYD MACRO 100 MG PO CAPS: 100.0000 mg | ORAL_CAPSULE | Freq: Two times a day (BID) | ORAL | 0 refills | Status: DC

## 2024-06-11 NOTE — ED Provider Notes (Signed)
 CAY RALPH PELT    CSN: [id number] Arrival date & time: 06/11/24  1509      History   Chief Complaint Chief Complaint  Patient presents with   Urinary Frequency    discomfort, burning, pain - Entered by patient    HPI [name] is a 78 y.o. female.   Patient presents for evaluation of urinary frequency and dysuria beginning 3 days ago.  Had areas of right-sided flank pain radiating into the lower abdomen occurred today, has resolved.  Denies discharge, odor.  Denies hematuria, fever.   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  Could be due to hernia or old scar tissue.  prev with CT done w/o alarming findings.  Will treat episodically.  Use tylenol  #3 prn.     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   Dysphagia 03/22/2023   History of colonic polyps 11/04/2022   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 07/06/2022   Intussusception of intestine (HCC) 06/28/2022   Lung nodule 05/01/2022   Jaw pain 04/17/2022   Dysuria 03/09/2022   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   Varicose veins of bilateral lower extremities with other complications 01/31/2022   Leg weakness, bilateral 01/31/2022   Vomiting without nausea 01/31/2022   Vitamin D  deficiency 01/31/2022   Burning tongue 01/31/2022   Tightness of neck 01/31/2022   Headache 10/06/2021   High  vitamin D  level 07/05/2021   Hypothyroidism, unspecified 07/02/2020   Wart 07/02/2020   Dermatitis 02/20/2020   Polyp of transverse colon    Gastric polyp    Stomach irritation    Healthcare maintenance 06/30/2019   Back pain 04/14/2018   Aortic atherosclerosis (HCC) 05/31/2017   Cancer of lower lobe of right lung (HCC) 05/06/2016   Thyroid  cancer (HCC) 09/11/2015   Advance care planning 05/14/2014   Other specified counseling 05/14/2014   Thrombocytopenia (HCC) 07/03/2013   Chest pain 07/03/2013   Osteopenia 05/05/2013   GERD (gastroesophageal reflux disease) 04/26/2012   Abdominal pain 01/12/2012   Essential hypertension 11/17/2010   HLD (hyperlipidemia) 01/31/2008   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    Past Surgical History:  Procedure Laterality Date   ABDOMINAL HYSTERECTOMY     BIOPSY  04/17/2023   Procedure: BIOPSY;  Surgeon: Unk Corinn Skiff, MD;  Location: [facility];  Service: Gastroenterology;;   BREAST EXCISIONAL BIOPSY Right 1977   BREAST MASS EXCISION  1977   benign   BREAST SURGERY     CARPAL TUNNEL RELEASE Right 1978   CATARACT EXTRACTION, BILATERAL Bilateral    CHOLECYSTECTOMY  11/23/2016   Procedure: LAPAROSCOPIC CHOLECYSTECTOMY;  Surgeon: Carlin Pastel, MD;  Location: [facility];  Service: General;;   COLONOSCOPY  2009   COLONOSCOPY WITH PROPOFOL  N/A 05/18/2019   Procedure:  COLONOSCOPY WITH PROPOFOL ;  Surgeon: Janalyn Keene NOVAK, MD;  Location: [facility];  Service: Endoscopy;  Laterality: N/A;   COLONOSCOPY WITH PROPOFOL  N/A 08/15/2019   Procedure: COLONOSCOPY WITH PROPOFOL ;  Surgeon: Janalyn Keene NOVAK, MD;  Location: [facility];  Service: Endoscopy;  Laterality: N/A;   COLONOSCOPY WITH PROPOFOL  N/A 11/04/2022   Procedure: COLONOSCOPY WITH PROPOFOL ;  Surgeon: Unk Corinn Skiff, MD;  Location: [facility];  Service: Gastroenterology;  Laterality: N/A;   DILATION AND CURETTAGE OF UTERUS  2001   ELECTROMAGNETIC  NAVIGATION BROCHOSCOPY N/A 08/25/2015   Procedure: ELECTROMAGNETIC NAVIGATION BRONCHOSCOPY;  Surgeon: Nickolas Cellar, MD;  Location: [facility];  Service: Cardiopulmonary;  Laterality: N/A;   ENDOBRONCHIAL ULTRASOUND N/A 08/25/2015   Procedure: ENDOBRONCHIAL ULTRASOUND;  Surgeon: Nickolas Cellar, MD;  Location: [facility];  Service: Cardiopulmonary;  Laterality: N/A;   ESOPHAGOGASTRODUODENOSCOPY  2002, 11/15/07   Normal (Dr. Raynette)   ESOPHAGOGASTRODUODENOSCOPY (EGD) WITH PROPOFOL  N/A 05/18/2019   Procedure: ESOPHAGOGASTRODUODENOSCOPY (EGD) WITH PROPOFOL ;  Surgeon: Janalyn Keene NOVAK, MD;  Location: [facility];  Service: Endoscopy;  Laterality: N/A;   ESOPHAGOGASTRODUODENOSCOPY (EGD) WITH PROPOFOL  N/A 08/15/2019   Procedure: ESOPHAGOGASTRODUODENOSCOPY (EGD) WITH PROPOFOL ;  Surgeon: Janalyn Keene NOVAK, MD;  Location: [facility];  Service: Endoscopy;  Laterality: N/A;   ESOPHAGOGASTRODUODENOSCOPY (EGD) WITH PROPOFOL  N/A 04/17/2023   Procedure: ESOPHAGOGASTRODUODENOSCOPY (EGD) WITH PROPOFOL ;  Surgeon: Unk Corinn Skiff, MD;  Location: [facility];  Service: Gastroenterology;  Laterality: N/A;   EYE SURGERY     FACIAL COSMETIC SURGERY  01/13/2010   mini facelift   HERNIA REPAIR  1976   INSERTION OF MESH  07/06/2022   Procedure: INSERTION OF MESH;  Surgeon: Rodolph Romano, MD;  Location: [facility];  Service: General;;   OOPHORECTOMY     PTOSIS REPAIR  12/08/2021   THORACOTOMY/LOBECTOMY Right 10/27/2015   Procedure: THORACOTOMY/LOBECTOMY;  Surgeon: Evalene Glasser, MD;  Location: [facility];  Service: Thoracic;  Laterality: Right;   THYROIDECTOMY N/A 12/29/2015   Procedure: THYROIDECTOMY;  Surgeon: Chinita Hasten, MD;  Location: [facility];  Service: ENT;  Laterality: N/A;   TOTAL VAGINAL HYSTERECTOMY  12/30/2008   for fibroid pain (Dr. Evangeline)   XI ROBOTIC ASSISTED SMALL BOWEL RESECTION N/A 06/28/2022   Procedure: XI ROBOTIC ASSISTED SMALL BOWEL RESECTION;  Surgeon: Rodolph Romano,  MD;  Location: [facility];  Service: General;  Laterality: N/A;    OB History     Gravida  0   Para  0   Term  0   Preterm  0   AB  0   Living  0      SAB  0   IAB  0   Ectopic  0   Multiple  0   Live Births           Obstetric Comments  1st Menstrual Cycle:  10 1st Pregnancy:  1          Home Medications    Prior to Admission medications   Medication Sig Start Date End Date Taking? Authorizing Provider  nitrofurantoin , macrocrystal-monohydrate, (MACROBID ) 100 MG capsule Take 1 capsule (100 mg total) by mouth 2 (two) times daily. 06/11/24  Yes Renae Mottley, Shelba SAUNDERS, NP  aspirin  EC 81 MG tablet Take 1 tablet (81 mg total) by mouth daily. 11/29/19   Cleatus Arlyss RAMAN, MD  Baclofen 5 MG TABS Take 10 mg by mouth at bedtime. Take 5 mg nightly for 1 week, then increase to 5 mg twice daily and continue 04/25/24   [provider]  Biotin   1000 MCG tablet Take 1,000 mcg by mouth in the morning and at bedtime.    [provider]  cholecalciferol (VITAMIN D3) 25 MCG (1000 UNIT) tablet Take 1,000 Units by mouth daily.    [provider]  Coenzyme Q10 200 MG capsule Take 100 mg by mouth daily.    [provider]  gabapentin  (NEURONTIN ) 100 MG capsule Take 2 capsules (200 mg total) by mouth daily. 06/02/24   Cleatus Arlyss RAMAN, MD  levothyroxine  (SYNTHROID ) 100 MCG tablet 1 pill a day except for 0.5 pills on Sundays. 6.5 tabs per week. 01/14/24   Cleatus Arlyss RAMAN, MD  multivitamin-lutein  (OCUVITE-LUTEIN ) CAPS capsule Take 1 capsule by mouth daily.    [provider]  Omega-3 Fatty Acids (FISH OIL) 1000 MG CAPS Take 1 capsule by mouth daily.    [provider]  pantoprazole  (PROTONIX ) 40 MG tablet Take 1 tablet (40 mg total) by mouth 2 (two) times daily before a meal. 04/29/24   Cleatus Arlyss RAMAN, MD  Potassium Gluconate 595 MG CAPS Take 595 mg by mouth daily.     [provider]  pravastatin  (PRAVACHOL ) 40 MG tablet TAKE 1 TABLET  BY MOUTH EVERY DAY 05/20/24   Cleatus Arlyss RAMAN, MD    Family History Family History  Problem Relation Age of Onset   Transient ischemic attack Mother    Hypertension Mother    Stroke Mother        mini strokes   Hypertension Sister    Cancer Sister        ovarian   Hypertension Sister    Hypertension Sister    Hypertension Sister    Breast cancer Neg Hx    Colon cancer Neg Hx     Social History Social History   Tobacco Use   Smoking status: Never   Smokeless tobacco: Never  Vaping Use   Vaping status: Never Used  Substance Use Topics   Alcohol use: No    Alcohol/week: 0.0 standard drinks of alcohol   Drug use: No     Allergies   Atorvastatin, Ciprofloxacin, Epinephrine , Metoprolol  succinate, Nortriptyline, Simvastatin, and Sulfa antibiotics   Review of Systems Review of Systems   Physical Exam Triage Vital Signs ED Triage Vitals  Encounter Vitals Group     BP 06/11/24 1525 (!) 149/88     Girls Systolic BP Percentile --      Girls Diastolic BP Percentile --      Boys Systolic BP Percentile --      Boys Diastolic BP Percentile --      Pulse Rate 06/11/24 1525 75     Resp 06/11/24 1525 18     Temp 06/11/24 1525 98 F (36.7 C)     Temp Source 06/11/24 1525 Oral     SpO2 06/11/24 1525 99 %     Weight --      Height --      Head Circumference --      Peak Flow --      Pain Score 06/11/24 1521 0     Pain Loc --      Pain Education --      Exclude from Growth Chart --    No data found.  Updated Vital Signs BP (!) 149/88 (BP Location: Left Arm)   Pulse 75   Temp 98 F (36.7 C) (Oral)   Resp 18   SpO2 99%   Visual Acuity Right Eye Distance:   Left Eye Distance:  Bilateral Distance:    Right Eye Near:   Left Eye Near:    Bilateral Near:     Physical Exam Constitutional:      Appearance: Normal appearance.  Eyes:     Extraocular Movements: Extraocular movements intact.  Pulmonary:     Effort: Pulmonary effort is normal.  Abdominal:      Tenderness: There is no abdominal tenderness. There is no right CVA tenderness, left CVA tenderness or guarding.  Neurological:     Mental Status: She is oriented to person, place, and time.      UC Treatments / Results  Labs (all labs ordered are listed, but only abnormal results are displayed) Labs Reviewed  URINE CULTURE  POCT URINE DIPSTICK  CERVICOVAGINAL ANCILLARY ONLY    EKG   Radiology No results found.  Procedures Procedures (including critical care time)  Medications Ordered in UC Medications - No data to display  Initial Impression / Assessment and Plan / UC Course  I have reviewed the triage vital signs and the nursing notes.  Pertinent labs & imaging results that were available during my care of the patient were reviewed by me and considered in my medical decision making (see chart for details).  Urinary frequency  Urinalysis negative, sent for culture discussed findings with patient empirically placed on Macrobid  if she is symptomatic, vaginal swab checking for yeast and vaginosis pending, will treat per protocol, further care with follow-up as needed Final Clinical Impressions(s) / UC Diagnoses   Final diagnoses:  Urinary frequency     Discharge Instructions      Your urinalysis at this time does not show any signs of infection, your urine will be sent to the lab to determine exactly which bacteria is present, if any changes need to be made to your medications you will be notified  Begin use of Macrobid  twice daily for 5 days as you are symptomatic  Vaginal swab checking for yeast and bacterial vaginosis pending 2 to 3 days, you will be notified of the test results only and additional treatment sent in at time of notification  You may use over-the-counter Azo to help minimize your symptoms until antibiotic removes bacteria, this medication will turn your urine orange  Increase your fluid intake through use of water  As always practice good  hygiene, wiping front to back and avoidance of scented vaginal products to prevent further irritation  If symptoms continue to persist after use of medication or recur please follow-up with urgent care or your primary doctor as needed    ED Prescriptions     Medication Sig Dispense Auth. Provider   nitrofurantoin , macrocrystal-monohydrate, (MACROBID ) 100 MG capsule Take 1 capsule (100 mg total) by mouth 2 (two) times daily. 10 capsule Aliou Mealey R, NP      PDMP not reviewed this encounter.   Teresa Shelba SAUNDERS, NP 06/11/24 1542

## 2024-06-11 NOTE — ED Triage Notes (Signed)
 Patient reports urinary frequency and burning sensation when urinating x 3 days. Denies pain at this time. Patient reports she has not taken anything for symptoms.

## 2024-06-11 NOTE — Discharge Instructions (Addendum)
 Your urinalysis at this time does not show any signs of infection, your urine will be sent to the lab to determine exactly which bacteria is present, if any changes need to be made to your medications you will be notified  Begin use of Macrobid  twice daily for 5 days as you are symptomatic  Vaginal swab checking for yeast and bacterial vaginosis pending 2 to 3 days, you will be notified of the test results only and additional treatment sent in at time of notification  You may use over-the-counter Azo to help minimize your symptoms until antibiotic removes bacteria, this medication will turn your urine orange  Increase your fluid intake through use of water  As always practice good hygiene, wiping front to back and avoidance of scented vaginal products to prevent further irritation  If symptoms continue to persist after use of medication or recur please follow-up with urgent care or your primary doctor as needed

## 2024-06-12 LAB — CERVICOVAGINAL ANCILLARY ONLY
Bacterial Vaginitis (gardnerella): NEGATIVE
Candida Glabrata: NEGATIVE
Candida Vaginitis: NEGATIVE
Comment: NEGATIVE
Comment: NEGATIVE
Comment: NEGATIVE

## 2024-06-13 ENCOUNTER — Ambulatory Visit (HOSPITAL_COMMUNITY): Payer: Self-pay

## 2024-06-13 LAB — URINE CULTURE: Culture: 20000 — AB

## 2024-06-14 ENCOUNTER — Inpatient Hospital Stay: Payer: PPO | Attending: Internal Medicine | Admitting: Internal Medicine

## 2024-06-14 ENCOUNTER — Other Ambulatory Visit: Payer: PPO

## 2024-06-14 ENCOUNTER — Encounter: Payer: Self-pay | Admitting: Internal Medicine

## 2024-06-14 VITALS — BP 141/63 | HR 75 | Temp 97.6°F | Resp 18 | Ht 64.0 in | Wt 124.8 lb

## 2024-06-14 DIAGNOSIS — R519 Headache, unspecified: Secondary | ICD-10-CM | POA: Diagnosis not present

## 2024-06-14 DIAGNOSIS — D696 Thrombocytopenia, unspecified: Secondary | ICD-10-CM | POA: Diagnosis not present

## 2024-06-14 DIAGNOSIS — Z85118 Personal history of other malignant neoplasm of bronchus and lung: Secondary | ICD-10-CM | POA: Diagnosis not present

## 2024-06-14 DIAGNOSIS — Z85850 Personal history of malignant neoplasm of thyroid: Secondary | ICD-10-CM | POA: Diagnosis not present

## 2024-06-14 DIAGNOSIS — M8588 Other specified disorders of bone density and structure, other site: Secondary | ICD-10-CM | POA: Diagnosis not present

## 2024-06-14 DIAGNOSIS — C73 Malignant neoplasm of thyroid gland: Secondary | ICD-10-CM

## 2024-06-14 DIAGNOSIS — Z79899 Other long term (current) drug therapy: Secondary | ICD-10-CM | POA: Diagnosis not present

## 2024-06-14 DIAGNOSIS — Z902 Acquired absence of lung [part of]: Secondary | ICD-10-CM | POA: Diagnosis not present

## 2024-06-14 DIAGNOSIS — C3431 Malignant neoplasm of lower lobe, right bronchus or lung: Secondary | ICD-10-CM | POA: Diagnosis not present

## 2024-06-14 NOTE — Progress Notes (Signed)
 [facility] OFFICE PROGRESS NOTE  Patient Care Team: Cleatus Arlyss RAMAN, MD as PCP - General (Family Medicine) Darliss Rogue, MD as PCP - Cardiology (Cardiology) Herminio Miu, MD as Consulting Physician (Otolaryngology) Volney Lye, MD (Inactive) as Consulting Physician (Cardiothoracic Surgery) Rennie Cindy SAUNDERS, MD as Consulting Physician (Oncology) Jaye Fallow, MD as Consulting Physician (Ophthalmology) Shelva Dunnings, MD as Consulting Physician (General Surgery) Fate Morna SAILOR, [facility] (Inactive) as Pharmacist (Pharmacist)   Cancer Staging  Thyroid  cancer [facility]) Staging form: Thyroid  - Papillary or Follicular (Under 45 years), AJCC 7th Edition - Clinical: Stage I (T1, N0, M0) - Signed by Wilder Pink, MD on 09/11/2015 Laterality: Right    Oncology History Overview Note  # NOV 2016- RLL [place] ca T1N0 [incidental; Dr.Simonds; Bronc- s/p RLlobectomy; Dr.Oaks;Dec 2016]  # FEB 2017- 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 STAGE I; s/p Total Thyroidetcomy [Dr.Chapman];NO RAIU.  LOW RISK; but detectable thyroglobulin- on Synthroid   #Survivorship-pending  DIAGNOSIS: Lung cancer -stage I #thyroid  cancer low risk  GOALS: Cure  CURRENT/MOST RECENT THERAPY: Surveillance    Thyroid  cancer (HCC)  Cancer of lower lobe of right lung (HCC)  05/06/2016 Initial Diagnosis   Malignant neoplasm of lower lobe, right bronchus or lung (HCC)    INTERVAL HISTORY: Accompanied by her husband.  Ambulating independently.   [name] 78 y.o.  female pleasant patient above history of stage I lung cancer with GOO and also stage I thyroid  cancer on synthroid  currently on surveillance is here for follow-up/review results of the CT scan.   Patient is currently on antibiotics for UTI.  S/p evaluation with neurologyhad MRi for head aches/ neck pain- bilateral shoulder pain- currently on baclofen 10 mg  at bedtime.   Patient complains of trouble swallowing- no regurgitation.    Currently being tx for UTI.   Patient denies any fever or chills or cough.  Denies any nausea vomiting abdominal pain.  Denies any headaches  Complains of chronic back pain not any worse.  Review of Systems  Constitutional:  Positive for malaise/fatigue. Negative for chills, diaphoresis, fever and weight loss.  HENT:  Negative for nosebleeds and sore throat.   Eyes:  Negative for double vision.  Respiratory:  Negative for cough, hemoptysis, sputum production, shortness of breath and wheezing.   Cardiovascular:  Negative for chest pain, palpitations, orthopnea and leg swelling.  Gastrointestinal:  Negative for abdominal pain, blood in stool, constipation, diarrhea, heartburn, melena and vomiting.  Genitourinary:  Negative for dysuria, frequency and urgency.  Musculoskeletal:  Positive for back pain. Negative for joint pain.  Skin: Negative.  Negative for itching and rash.  Neurological:  Positive for tingling. Negative for dizziness, focal weakness, weakness and headaches.  Endo/Heme/Allergies:  Does not bruise/bleed easily.  Psychiatric/Behavioral:  Negative for depression. The patient is not nervous/anxious and does not have insomnia.       PAST MEDICAL HISTORY :  Past Medical History:  Diagnosis Date   Adenocarcinoma of right lung (HCC) 10/01/2015   GERD (gastroesophageal reflux disease)    GIB (gastrointestinal bleeding)    Groin pain    Along superior/laterl R inguinal canal.  Could be due to hernia or old scar tissue.  prev with CT done w/o alarming findings.  Will treat episodically.  Use tylenol  #3 prn.     Headache    Hyperlipidemia    Lipoma of thigh    Left   Lung nodule    Normal cardiac  stress test 2014   PONV (postoperative nausea and vomiting)    nausea after hysterectomy, and 2 days after lung surgery 10/2015   Thyroid  cancer (HCC) 12/2015   Thyroid  nodule     PAST SURGICAL HISTORY :    Past Surgical History:  Procedure Laterality Date   ABDOMINAL HYSTERECTOMY     BIOPSY  04/17/2023   Procedure: BIOPSY;  Surgeon: Unk Corinn Skiff, MD;  Location: [facility] END[facility];  Service: Gastroenterology;;   BREAST EXCISIONAL BIOPSY Right 1977   BREAST MASS EXCISION  1977   benign   BREAST SURGERY     CARPAL TUNNEL RELEASE Right 1978   CATARACT EXTRACTION, BILATERAL Bilateral    CHOLECYSTECTOMY  11/23/2016   Procedure: LAPAROSCOPIC CHOLECYSTECTOMY;  Surgeon: Carlin Pastel, MD;  Location: [facility] ORS;  Service: General;;   COLONOSCOPY  2009   COLONOSCOPY WITH PROPOFOL  N/A 05/18/2019   Procedure: COLONOSCOPY WITH PROPOFOL ;  Surgeon: Janalyn Keene NOVAK, MD;  Location: [facility] END[facility]COPY;  Service: Endoscopy;  Laterality: N/A;   COLONOSCOPY WITH PROPOFOL  N/A 08/15/2019   Procedure: COLONOSCOPY WITH PROPOFOL ;  Surgeon: Janalyn Keene NOVAK, MD;  Location: [facility];  Service: Endoscopy;  Laterality: N/A;   COLONOSCOPY WITH PROPOFOL  N/A 11/04/2022   Procedure: COLONOSCOPY WITH PROPOFOL ;  Surgeon: Unk Corinn Skiff, MD;  Location: [facility] ENDOSCOPY;  Service: Gastroenterology;  Laterality: N/A;   DILATION AND CURETTAGE OF UTERUS  2001   ELECTROMAGNETIC NAVIGATION BROCHOSCOPY N/A 08/25/2015   Procedure: ELECTROMAGNETIC NAVIGATION BRONCHOSCOPY;  Surgeon: Nickolas Cellar, MD;  Location: [facility];  Service: Cardiopulmonary;  Laterality: N/A;   ENDOBRONCHIAL ULTRASOUND N/A 08/25/2015   Procedure: ENDOBRONCHIAL ULTRASOUND;  Surgeon: Nickolas Cellar, MD;  Location: [facility];  Service: Cardiopulmonary;  Laterality: N/A;   ESOPHAGOGASTRODUODENOSCOPY  2002, 11/15/07   Normal (Dr. Raynette)   ESOPHAGOGASTRODUODENOSCOPY (EGD) WITH PROPOFOL  N/A 05/18/2019   Procedure: ESOPHAGOGASTRODUODENOSCOPY (EGD) WITH PROPOFOL ;  Surgeon: Janalyn Keene NOVAK, MD;  Location: [facility];  Service: Endoscopy;  Laterality: N/A;   ESOPHAGOGASTRODUODENOSCOPY (EGD) WITH PROPOFOL  N/A 08/15/2019   Procedure:  ESOPHAGOGASTRODUODENOSCOPY (EGD) WITH PROPOFOL ;  Surgeon: Janalyn Keene NOVAK, MD;  Location: [facility];  Service: Endoscopy;  Laterality: N/A;   ESOPHAGOGASTRODUODENOSCOPY (EGD) WITH PROPOFOL  N/A 04/17/2023   Procedure: ESOPHAGOGASTRODUODENOSCOPY (EGD) WITH PROPOFOL ;  Surgeon: Unk Corinn Skiff, MD;  Location: [facility];  Service: Gastroenterology;  Laterality: N/A;   EYE SURGERY     FACIAL COSMETIC SURGERY  01/13/2010   mini facelift   HERNIA REPAIR  1976   INSERTION OF MESH  07/06/2022   Procedure: INSERTION OF MESH;  Surgeon: Rodolph Romano, MD;  Location: [facility];  Service: General;;   OOPHORECTOMY     PTOSIS REPAIR  12/08/2021   THORACOTOMY/LOBECTOMY Right 10/27/2015   Procedure: THORACOTOMY/LOBECTOMY;  Surgeon: Evalene Glasser, MD;  Location: [facility];  Service: Thoracic;  Laterality: Right;   THYROIDECTOMY N/A 12/29/2015   Procedure: THYROIDECTOMY;  Surgeon: Chinita Hasten, MD;  Location: [facility];  Service: ENT;  Laterality: N/A;   TOTAL VAGINAL HYSTERECTOMY  12/30/2008   for fibroid pain (Dr. Evangeline)   XI ROBOTIC ASSISTED SMALL BOWEL RESECTION N/A 06/28/2022   Procedure: XI ROBOTIC ASSISTED SMALL BOWEL RESECTION;  Surgeon: Rodolph Romano, MD;  Location: [facility];  Service: General;  Laterality: N/A;    FAMILY HISTORY :   Family History  Problem Relation Age of Onset   Transient ischemic attack Mother    Hypertension Mother    Stroke Mother        mini strokes   Hypertension Sister  Cancer Sister        ovarian   Hypertension Sister    Hypertension Sister    Hypertension Sister    Breast cancer Neg Hx    Colon cancer Neg Hx     SOCIAL HISTORY:   Social History   Tobacco Use   Smoking status: Never   Smokeless tobacco: Never  Vaping Use   Vaping status: Never Used  Substance Use Topics   Alcohol use: No    Alcohol/week: 0.0 standard drinks of alcohol   Drug use: No    ALLERGIES:  is allergic to atorvastatin, ciprofloxacin,  epinephrine , metoprolol  succinate, nortriptyline, simvastatin, and sulfa antibiotics.  MEDICATIONS:  Current Outpatient Medications  Medication Sig Dispense Refill   aspirin  EC 81 MG tablet Take 1 tablet (81 mg total) by mouth daily.     Baclofen 5 MG TABS Take 10 mg by mouth at bedtime. Take 5 mg nightly for 1 week, then increase to 5 mg twice daily and continue     Biotin  1000 MCG tablet Take 1,000 mcg by mouth in the morning and at bedtime.     cholecalciferol (VITAMIN D3) 25 MCG (1000 UNIT) tablet Take 1,000 Units by mouth daily.     Coenzyme Q10 200 MG capsule Take 100 mg by mouth daily.     gabapentin  (NEURONTIN ) 100 MG capsule Take 2 capsules (200 mg total) by mouth daily. 180 capsule 3   levothyroxine  (SYNTHROID ) 100 MCG tablet 1 pill a day except for 0.5 pills on Sundays. 6.5 tabs per week. 90 tablet 3   multivitamin-lutein  (OCUVITE-LUTEIN ) CAPS capsule Take 1 capsule by mouth daily.     nitrofurantoin , macrocrystal-monohydrate, (MACROBID ) 100 MG capsule Take 1 capsule (100 mg total) by mouth 2 (two) times daily. 10 capsule 0   Omega-3 Fatty Acids (FISH OIL) 1000 MG CAPS Take 1 capsule by mouth daily.     pantoprazole  (PROTONIX ) 40 MG tablet Take 1 tablet (40 mg total) by mouth 2 (two) times daily before a meal. 180 tablet 3   Potassium Gluconate 595 MG CAPS Take 595 mg by mouth daily.      pravastatin  (PRAVACHOL ) 40 MG tablet TAKE 1 TABLET BY MOUTH EVERY DAY 90 tablet 3   No current facility-administered medications for this visit.    PHYSICAL EXAMINATION: ECOG PERFORMANCE STATUS: 1 - Symptomatic but completely ambulatory  BP (!) 141/63 (BP Location: Left Arm, Patient Position: Sitting, Cuff Size: Normal)   Pulse 75   Temp 97.6 F (36.4 C) (Tympanic)   Resp 18   Ht 5' 4 (1.626 m)   Wt 124 lb 12.8 oz (56.6 kg)   SpO2 100%   BMI 21.42 kg/m   Filed Weights   06/14/24 1250  Weight: 124 lb 12.8 oz (56.6 kg)         Physical Exam HENT:     Head: Normocephalic and  atraumatic.     Mouth/Throat:     Pharynx: No oropharyngeal exudate.  Eyes:     Pupils: Pupils are equal, round, and reactive to light.  Cardiovascular:     Rate and Rhythm: Normal rate and regular rhythm.  Pulmonary:     Effort: No respiratory distress.     Breath sounds: No wheezing.  Abdominal:     General: Bowel sounds are normal. There is no distension.     Palpations: Abdomen is soft. There is no mass.     Tenderness: There is no abdominal tenderness. There is no guarding or rebound.  Musculoskeletal:  General: No tenderness. Normal range of motion.     Cervical back: Normal range of motion and neck supple.  Skin:    General: Skin is warm.  Neurological:     Mental Status: She is alert and oriented to person, place, and time.  Psychiatric:        Mood and Affect: Affect normal.    LABORATORY DATA:  I have reviewed the data as listed    Component Value Date/Time   NA 137 05/24/2024 0904   NA 141 07/01/2013 0103   K 4.4 05/24/2024 0904   K 3.8 07/01/2013 0103   CL 101 05/24/2024 0904   CL 110 (H) 07/01/2013 0103   CO2 28 05/24/2024 0904   CO2 27 07/01/2013 0103   GLUCOSE 88 05/24/2024 0904   GLUCOSE 88 07/01/2013 0103   BUN 16 05/24/2024 0904   BUN 11 07/01/2013 0103   CREATININE 0.71 05/24/2024 0904   CREATININE 0.68 07/01/2013 0103   CALCIUM  8.9 05/24/2024 0904   CALCIUM  8.2 (L) 07/01/2013 0103   PROT 6.4 (L) 05/24/2024 0904   PROT 5.5 (L) 07/01/2013 0103   ALBUMIN 4.2 05/24/2024 0904   ALBUMIN 3.0 (L) 07/01/2013 0103   AST 22 05/24/2024 0904   ALT 22 05/24/2024 0904   ALT 24 07/01/2013 0103   ALKPHOS 45 05/24/2024 0904   ALKPHOS 55 07/01/2013 0103   BILITOT 1.0 05/24/2024 0904   GFRNONAA >60 05/24/2024 0904   GFRNONAA >60 07/01/2013 0103   GFRAA >60 05/19/2019 0117   GFRAA >60 07/01/2013 0103    No results found for: SPEP, UPEP  Lab Results  Component Value Date   WBC 6.3 05/24/2024   NEUTROABS 4.8 02/01/2024   HGB 12.7 05/24/2024    HCT 39.1 05/24/2024   MCV 95.4 05/24/2024   PLT 149 (L) 05/24/2024      Chemistry      Component Value Date/Time   NA 137 05/24/2024 0904   NA 141 07/01/2013 0103   K 4.4 05/24/2024 0904   K 3.8 07/01/2013 0103   CL 101 05/24/2024 0904   CL 110 (H) 07/01/2013 0103   CO2 28 05/24/2024 0904   CO2 27 07/01/2013 0103   BUN 16 05/24/2024 0904   BUN 11 07/01/2013 0103   CREATININE 0.71 05/24/2024 0904   CREATININE 0.68 07/01/2013 0103      Component Value Date/Time   CALCIUM  8.9 05/24/2024 0904   CALCIUM  8.2 (L) 07/01/2013 0103   ALKPHOS 45 05/24/2024 0904   ALKPHOS 55 07/01/2013 0103   AST 22 05/24/2024 0904   ALT 22 05/24/2024 0904   ALT 24 07/01/2013 0103   BILITOT 1.0 05/24/2024 0904       RADIOGRAPHIC STUDIES: I have personally reviewed the radiological images as listed and agreed with the findings in the report. No results found.   ASSESSMENT & PLAN:  Cancer of lower lobe of right lung (HCC) # RLL Lung ca; Stage I;  no adjuvant therapy.  JULY 27th 2024- CT - Stable.    # RUL-groundglass opacity JUNE 2023- CT scan: RUL  Progressive enlargement of a ground-glass density right upper lobe nodule over the last 2.5 years, suspicious for low-grade adenocarcinoma.  AUG 2025- Surgical changes of prior right lower lobectomy, stable from prior;  Persistent bilateral pulmonary nodules and ground-glass opacities, some of which are slightly decreased in conspicuity from prior. No definite new suspicious pulmonary nodules or masses.  Stable borderline enlarged mediastinal and hilar lymph nodes.  Recommend continued surveillance.will order  CT scan in 6 months.   # Thyroid  cancer [FEB 2017] status post thyroidectomy low risk stage I; on Synthroid  75 mcg. FEB 2023-thyroid  ultrasound no evidence of recurrence. TSH - WNL. - followed by PCP.  AUG 2025- 2025- thyroglobulin - overall stable. Will repeat thyroid  US .   # Jan 2021-  BMD- Osteopenia- T score=- 2.0; improved from 4 years ago;  ca+vit D BID-stable. The BMD measured at [facility] Sp[facility] L1-L3 is 0.934 g/cm2 with a T-score of -2.0. awaiting prolia as per PCP.   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  Monitor for now  # Headaches/bilateral shoudler pain- on baclofen-     # DISPOSITION:  # Thyroid  US  in 1 week- # follow up in 6 months-MD;]cbc/cmp/ thyroid  profile/ Thyroglobulin/and thyroglobulin antibodies-3 week prior];CT chest with contrast prior- - Dr.B   # I reviewed the blood work- with the patient in detail; also reviewed the imaging independently [as summarized above]; and with the patient in detail.       Orders Placed This Encounter  Procedures   US  THYROID     Standing Status:   Future    Expected Date:   06/21/2024    Expiration Date:   06/14/2025    Reason for Exam (SYMPTOM  OR DIAGNOSIS REQUIRED):   thyroid  cancer    Preferred imaging location?:   [facility]   CT CHEST W CONTRAST    Standing Status:   Future    Expected Date:   12/15/2024    Expiration Date:   06/14/2025    If indicated for the ordered procedure, I authorize the administration of contrast media per Radiology protocol:   Yes    Preferred imaging location?:   [facility]   All questions were answered. The patient knows to call the clinic with any problems, questions or concerns.      Cindy JONELLE Joe, MD 06/14/2024 1:26 PM

## 2024-06-14 NOTE — Assessment & Plan Note (Addendum)
#   RLL Lung ca; Stage I;  no adjuvant therapy.  JULY 27th 2024- CT - Stable.    # RUL-groundglass opacity JUNE 2023- CT scan: RUL  Progressive enlargement of a ground-glass density right upper lobe nodule over the last 2.5 years, suspicious for low-grade adenocarcinoma.  AUG 2025- Surgical changes of prior right lower lobectomy, stable from prior;  Persistent bilateral pulmonary nodules and ground-glass opacities, some of which are slightly decreased in conspicuity from prior. No definite new suspicious pulmonary nodules or masses.  Stable borderline enlarged mediastinal and hilar lymph nodes.  Recommend continued surveillance.will order CT scan in 6 months.   # Thyroid  cancer [FEB 2017] status post thyroidectomy low risk stage I; on Synthroid  75 mcg. FEB 2023-thyroid  ultrasound no evidence of recurrence. TSH - WNL. - followed by PCP.  AUG 2025- 2025- thyroglobulin - overall stable. Will repeat thyroid  US .   # Jan 2021-  BMD- Osteopenia- T score=- 2.0; improved from 4 years ago; ca+vit D BID-stable. The BMD measured at [facility] Spine L1-L3 is 0.934 g/cm2 with a T-score of -2.0. awaiting prolia as per PCP.   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  Monitor for now  # Headaches/bilateral shoudler pain- on baclofen-     # DISPOSITION:  # Thyroid  US  in 1 week- # follow up in 6 months-MD;]cbc/cmp/ thyroid  profile/ Thyroglobulin/and thyroglobulin antibodies-3 week prior];CT chest with contrast prior- - Dr.B   # I reviewed the blood work- with the patient in detail; also reviewed the imaging independently [as summarized above]; and with the patient in detail.

## 2024-06-14 NOTE — Progress Notes (Signed)
 CT chest chest 05/31/24. MRI brain 03/13/24 by [facility]. MRI back 04/04/24 by Dr. Cleatus. 04/29/24 xray neck by Dr. Cleatus.  Currently being tx for UTI.  Having some trouble swallowing, concerned due to thyroid  removed.  C/o pain b/w shoulders 1/10 gets up to 6/10, has been going on for a while.

## 2024-06-17 DIAGNOSIS — H5712 Ocular pain, left eye: Secondary | ICD-10-CM | POA: Diagnosis not present

## 2024-06-17 DIAGNOSIS — H04123 Dry eye syndrome of bilateral lacrimal glands: Secondary | ICD-10-CM | POA: Diagnosis not present

## 2024-06-24 ENCOUNTER — Ambulatory Visit
Admission: RE | Admit: 2024-06-24 | Discharge: 2024-06-24 | Disposition: A | Discharge status: Home / Self Care | Source: Ambulatory Visit | Attending: Internal Medicine | Admitting: Internal Medicine

## 2024-06-24 DIAGNOSIS — C73 Malignant neoplasm of thyroid gland: Secondary | ICD-10-CM | POA: Insufficient documentation

## 2024-06-25 DIAGNOSIS — G5133 Clonic hemifacial spasm, bilateral: Secondary | ICD-10-CM | POA: Diagnosis not present

## 2024-06-25 DIAGNOSIS — Z1331 Encounter for screening for depression: Secondary | ICD-10-CM | POA: Diagnosis not present

## 2024-06-25 DIAGNOSIS — G514 Facial myokymia: Secondary | ICD-10-CM | POA: Diagnosis not present

## 2024-06-25 DIAGNOSIS — R519 Headache, unspecified: Secondary | ICD-10-CM | POA: Diagnosis not present

## 2024-06-27 ENCOUNTER — Encounter: Payer: Self-pay | Admitting: Family Medicine

## 2024-07-08 DIAGNOSIS — M542 Cervicalgia: Secondary | ICD-10-CM | POA: Diagnosis not present

## 2024-07-12 DIAGNOSIS — M542 Cervicalgia: Secondary | ICD-10-CM | POA: Diagnosis not present

## 2024-07-16 DIAGNOSIS — M542 Cervicalgia: Secondary | ICD-10-CM | POA: Diagnosis not present

## 2024-07-18 DIAGNOSIS — M542 Cervicalgia: Secondary | ICD-10-CM | POA: Diagnosis not present

## 2024-08-10 DIAGNOSIS — M542 Cervicalgia: Secondary | ICD-10-CM | POA: Diagnosis not present

## 2024-08-12 DIAGNOSIS — M542 Cervicalgia: Secondary | ICD-10-CM | POA: Diagnosis not present

## 2024-08-14 DIAGNOSIS — M542 Cervicalgia: Secondary | ICD-10-CM | POA: Diagnosis not present

## 2024-08-21 ENCOUNTER — Ambulatory Visit: Payer: PPO

## 2024-08-21 VITALS — Ht 64.0 in | Wt 124.0 lb

## 2024-08-21 DIAGNOSIS — Z0000 Encounter for general adult medical examination without abnormal findings: Secondary | ICD-10-CM | POA: Diagnosis not present

## 2024-08-21 NOTE — Patient Instructions (Signed)
 [name],  Thank you for taking the time for your Medicare Wellness Visit. I appreciate your continued commitment to your health goals. Please review the care plan we discussed, and feel free to reach out if I can assist you further.  Medicare recommends these wellness visits once per year to help you and your care team stay ahead of potential health issues. These visits are designed to focus on prevention, allowing your provider to concentrate on managing your acute and chronic conditions during your regular appointments.  Please note that Annual Wellness Visits do not include a physical exam. Some assessments may be limited, especially if the visit was conducted virtually. If needed, we may recommend a separate in-person follow-up with your provider.  Ongoing Care Seeing your primary care provider every 3 to 6 months helps us  monitor your health and provide consistent, personalized care.   Referrals If a referral was made during today's visit and you haven't received any updates within two weeks, please contact the referred provider directly to check on the status.  Recommended Screenings:  Health Maintenance  Topic Date Due   Zoster (Shingles) Vaccine (1 of 2) 05/17/1965   DTaP/Tdap/Td vaccine (2 - Tdap) 02/12/2019   Flu Shot  05/31/2024   DEXA scan (bone density measurement)  03/05/2025   Medicare Annual Wellness Visit  08/21/2025   Pneumococcal Vaccine for age over 81  Completed   Meningitis B Vaccine  Aged Out   Breast Cancer Screening  Discontinued   Colon Cancer Screening  Discontinued   COVID-19 Vaccine  Discontinued   Hepatitis C Screening  Discontinued       06/14/2024   12:51 PM  Advanced Directives  Does Patient Have a Medical Advance Directive? Yes  Type of Estate agent of [place];Living will  Copy of Healthcare Power of Attorney in Chart? Yes - validated most recent copy scanned in chart (See row information)   Advance Care Planning is  important because it: Ensures you receive medical care that aligns with your values, goals, and preferences. Provides guidance to your family and loved ones, reducing the emotional burden of decision-making during critical moments.  Vision: Annual vision screenings are recommended for early detection of glaucoma, cataracts, and diabetic retinopathy. These exams can also reveal signs of chronic conditions such as diabetes and high blood pressure.  Dental: Annual dental screenings help detect early signs of oral cancer, gum disease, and other conditions linked to overall health, including heart disease and diabetes.

## 2024-08-21 NOTE — Progress Notes (Signed)
 Please attest and cosign this visit due to patients primary care provider not being in the office at the time the visit was completed.    Subjective:   [name] is a 78 y.o. who presents for a Medicare Wellness preventive visit.  As a reminder, Annual Wellness Visits don't include a physical exam, and some assessments may be limited, especially if this visit is performed virtually. We may recommend an in-person follow-up visit with your provider if needed.  Visit Complete: Virtual I connected with  [name] on 08/21/24 by a audio enabled telemedicine application and verified that I am speaking with the correct person using two identifiers.  Patient Location: Home  Provider Location: Office/Clinic  I discussed the limitations of evaluation and management by telemedicine. The patient expressed understanding and agreed to proceed.  Vital Signs: Because this visit was a virtual/telehealth visit, some criteria may be missing or patient reported. Any vitals not documented were not able to be obtained and vitals that have been documented are patient reported.  VideoDeclined- This patient declined Librarian, academic. Therefore the visit was completed with audio only.  Persons Participating in Visit: Patient.  AWV Questionnaire: No: Patient Medicare AWV questionnaire was not completed prior to this visit.  Cardiac Risk Factors include: advanced age (>48men, >79 women);dyslipidemia;hypertension     Objective:    Today's Vitals   08/21/24 1335  Weight: 124 lb (56.2 kg)  Height: 5' 4 (1.626 m)   Body mass index is 21.28 kg/m.     08/21/2024    1:44 PM 06/14/2024   12:51 PM 12/06/2023    2:33 PM 08/21/2023    3:04 PM 07/20/2023   10:06 AM 05/31/2023    1:02 PM 04/17/2023    8:28 AM  Advanced Directives  Does Patient Have a Medical Advance Directive? Yes Yes Yes Yes Yes Yes Yes  Type of Estate agent of [place];Living will  Healthcare Power of [place];Living will Healthcare Power of [place];Living will Healthcare Power of [place];Living will Healthcare Power of [place];Living will Healthcare Power of [employer] of [place];Living will  Does patient want to make changes to medical advance directive?    No - Patient declined     Copy of Healthcare Power of Attorney in Chart? Yes - validated most recent copy scanned in chart (See row information) Yes - validated most recent copy scanned in chart (See row information) Yes - validated most recent copy scanned in chart (See row information) Yes - validated most recent copy scanned in chart (See row information)  Yes - validated most recent copy scanned in chart (See row information)     Current Medications (verified) Outpatient Encounter Medications as of 08/21/2024  Medication Sig   aspirin  EC 81 MG tablet Take 1 tablet (81 mg total) by mouth daily.   Baclofen 5 MG TABS Take 10 mg by mouth at bedtime. Take 5 mg nightly for 1 week, then increase to 5 mg twice daily and continue   Biotin  1000 MCG tablet Take 1,000 mcg by mouth in the morning and at bedtime.   cholecalciferol (VITAMIN D3) 25 MCG (1000 UNIT) tablet Take 1,000 Units by mouth daily.   Coenzyme Q10 200 MG capsule Take 100 mg by mouth daily.   gabapentin  (NEURONTIN ) 100 MG capsule Take 2 capsules (200 mg total) by mouth daily.   levothyroxine  (SYNTHROID ) 100 MCG tablet 1 pill a day except for 0.5 pills on Sundays. 6.5 tabs per week.   multivitamin-lutein  (  OCUVITE-LUTEIN ) CAPS capsule Take 1 capsule by mouth daily.   nitrofurantoin , macrocrystal-monohydrate, (MACROBID ) 100 MG capsule Take 1 capsule (100 mg total) by mouth 2 (two) times daily.   Omega-3 Fatty Acids (FISH OIL) 1000 MG CAPS Take 1 capsule by mouth daily.   pantoprazole  (PROTONIX ) 40 MG tablet Take 1 tablet (40 mg total) by mouth 2 (two) times daily before a meal.   Potassium Gluconate 595 MG CAPS Take 595 mg by mouth daily.     pravastatin  (PRAVACHOL ) 40 MG tablet TAKE 1 TABLET BY MOUTH EVERY DAY   No facility-administered encounter medications on file as of 08/21/2024.    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  Could be due to hernia or old scar tissue.  prev with CT done w/o alarming findings.  Will treat episodically.  Use tylenol  #3 prn.     Headache    Hyperlipidemia    Lipoma of thigh    Left   Lung nodule    Normal cardiac stress test 2014   PONV (postoperative nausea and vomiting)    nausea after hysterectomy, and 2 days after lung surgery 10/2015   Thyroid  cancer (HCC) 12/2015   Thyroid  nodule    Past Surgical History:  Procedure Laterality Date   ABDOMINAL HYSTERECTOMY     BIOPSY  04/17/2023   Procedure: BIOPSY;  Surgeon: Unk Corinn Skiff, MD;  Location: [facility];  Service: Gastroenterology;;   BREAST EXCISIONAL BIOPSY Right 1977   BREAST MASS EXCISION  1977   benign   BREAST SURGERY     CARPAL TUNNEL RELEASE Right 1978   CATARACT EXTRACTION, BILATERAL Bilateral    CHOLECYSTECTOMY  11/23/2016   Procedure: LAPAROSCOPIC CHOLECYSTECTOMY;  Surgeon: Carlin Pastel, MD;  Location: [facility];  Service: General;;   COLONOSCOPY  2009   COLONOSCOPY WITH PROPOFOL  N/A 05/18/2019   Procedure: COLONOSCOPY WITH PROPOFOL ;  Surgeon: Janalyn Keene NOVAK, MD;  Location: [facility];  Service: Endoscopy;  Laterality: N/A;   COLONOSCOPY WITH PROPOFOL  N/A 08/15/2019   Procedure: COLONOSCOPY WITH PROPOFOL ;  Surgeon: Janalyn Keene NOVAK, MD;  Location: [facility];  Service: Endoscopy;  Laterality: N/A;   COLONOSCOPY WITH PROPOFOL  N/A 11/04/2022   Procedure: COLONOSCOPY  WITH PROPOFOL ;  Surgeon: Unk Corinn Skiff, MD;  Location: [facility] ENDOSCOPY;  Service: Gastroenterology;  Laterality: N/A;   DILATION AND CURETTAGE OF UTERUS  2001   ELECTROMAGNETIC NAVIGATION BROCHOSCOPY N/A 08/25/2015   Procedure: ELECTROMAGNETIC NAVIGATION BRONCHOSCOPY;  Surgeon: Nickolas Cellar, MD;  Location: [facility];  Service: Cardiopulmonary;  Laterality: N/A;   ENDOBRONCHIAL ULTRASOUND N/A 08/25/2015   Procedure: ENDOBRONCHIAL ULTRASOUND;  Surgeon: Nickolas Cellar, MD;  Location: [facility];  Service: Cardiopulmonary;  Laterality: N/A;   ESOPHAGOGASTRODUODENOSCOPY  2002, 11/15/07   Normal (Dr. Raynette)   ESOPHAGOGASTRODUODENOSCOPY (EGD) WITH PROPOFOL  N/A 05/18/2019   Procedure: ESOPHAGOGASTRODUODENOSCOPY (EGD) WITH PROPOFOL ;  Surgeon: Janalyn Keene NOVAK, MD;  Location: [facility];  Service: Endoscopy;  Laterality: N/A;   ESOPHAGOGASTRODUODENOSCOPY (EGD) WITH PROPOFOL  N/A 08/15/2019   Procedure: ESOPHAGOGASTRODUODENOSCOPY (EGD) WITH PROPOFOL ;  Surgeon: Janalyn Keene NOVAK, MD;  Location: [facility];  Service: Endoscopy;  Laterality: N/A;   ESOPHAGOGASTRODUODENOSCOPY (EGD) WITH PROPOFOL  N/A 04/17/2023   Procedure: ESOPHAGOGASTRODUODENOSCOPY (EGD) WITH PROPOFOL ;  Surgeon: Unk Corinn Skiff, MD;  Location: [facility];  Service: Gastroenterology;  Laterality: N/A;   EYE SURGERY     FACIAL COSMETIC SURGERY  01/13/2010   mini facelift   HERNIA REPAIR  1976   INSERTION OF MESH  07/06/2022   Procedure: INSERTION OF MESH;  Surgeon: Rodolph Romano, MD;  Location: [facility];  Service: General;;   OOPHORECTOMY     PTOSIS REPAIR  12/08/2021   THORACOTOMY/LOBECTOMY Right 10/27/2015   Procedure: THORACOTOMY/LOBECTOMY;  Surgeon: Evalene Glasser, MD;  Location: [facility];  Service: Thoracic;  Laterality: Right;   THYROIDECTOMY N/A 12/29/2015   Procedure: THYROIDECTOMY;  Surgeon: Chinita Hasten, MD;  Location: [facility];  Service: ENT;  Laterality: N/A;   TOTAL VAGINAL HYSTERECTOMY  12/30/2008    for fibroid pain (Dr. Evangeline)   XI ROBOTIC ASSISTED SMALL BOWEL RESECTION N/A 06/28/2022   Procedure: XI ROBOTIC ASSISTED SMALL BOWEL RESECTION;  Surgeon: Rodolph Romano, MD;  Location: [facility];  Service: General;  Laterality: N/A;   Family History  Problem Relation Age of Onset   Transient ischemic attack Mother    Hypertension Mother    Stroke Mother        mini strokes   Hypertension Sister    Cancer Sister        ovarian   Hypertension Sister    Hypertension Sister    Hypertension Sister    Breast cancer Neg Hx    Colon cancer Neg Hx    Social History   Socioeconomic History   Marital status: Married    Spouse name: Not on file   Number of children: Not on file   Years of education: Not on file   Highest education level: Not on file  Occupational History   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  He has short term memory loss.     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   Previously had an adopted son who died in 09/07/2020.   Social Drivers of Corporate investment banker Strain: Low Risk  (08/21/2024)   Overall Financial Resource Strain (CARDIA)    Difficulty of Paying Living Expenses: Not hard at all  Food Insecurity: No Food Insecurity (08/21/2024)   Hunger Vital Sign    Worried About Running Out of Food in the Last Year: Never true    Ran Out of Food in the Last Year: Never true  Transportation Needs: No Transportation Needs (08/21/2024)   PRAPARE - Administrator, Civil Service (Medical): No    Lack of Transportation (Non-Medical): No  Physical Activity:  Sufficiently Active (08/21/2024)   Exercise Vital Sign    Days of Exercise per Week: 5 days    Minutes of Exercise per Session: 30 min  Stress: No Stress Concern Present (08/21/2024)   [employer] of Occupational Health - Occupational Stress Questionnaire    Feeling of Stress: Only a little  Social Connections: Socially Integrated (08/21/2024)   Social Connection and Isolation Panel    Frequency of Communication with Friends and Family: More than three times  a week    Frequency of Social Gatherings with Friends and Family: More than three times a week    Attends Religious Services: More than 4 times per year    Active Member of [employer] or Organizations: Yes    Attends Engineer, structural: More than 4 times per year    Marital Status: Married    Tobacco Counseling Counseling given: Not Answered    Clinical Intake:  Pre-visit preparation completed: Yes  Pain : No/denies pain     BMI - recorded: 21.28 Nutritional Status: BMI of 19-24  Normal Nutritional Risks: None Diabetes: No  No results found for: HGBA1C   How often do you need to have someone help you when you read instructions, pamphlets, or other written materials from your doctor or pharmacy?: 1 - Never  Interpreter Needed?: No  Comments: lives with husband Information entered by :: B.Deauna Yaw,LPN   Activities of Daily Living     08/21/2024    1:45 PM  In your present state of health, do you have any difficulty performing the following activities:  Hearing? 0  Vision? 0  Difficulty concentrating or making decisions? 0  Walking or climbing stairs? 0  Dressing or bathing? 0  Doing errands, shopping? 0  Preparing Food and eating ? N  Using the Toilet? N  In the past six months, have you accidently leaked urine? N  Do you have problems with loss of bowel control? N  Managing your Medications? N  Managing your Finances? N  Housekeeping or managing your Housekeeping? N    Patient Care  Team: Cleatus Arlyss RAMAN, MD as PCP - General (Family Medicine) Darliss Rogue, MD as PCP - Cardiology (Cardiology) Herminio Miu, MD as Consulting Physician (Otolaryngology) Volney Lye, MD (Inactive) as Consulting Physician (Cardiothoracic Surgery) Rennie Cindy SAUNDERS, MD as Consulting Physician (Oncology) Jaye Fallow, MD as Consulting Physician (Ophthalmology) Shelva Dunnings, MD as Consulting Physician (General Surgery) Fate Morna SAILOR, [facility] (Inactive) as Pharmacist (Pharmacist) Pa, [facility] (Optometry)  I have updated your Care Teams any recent Medical Services you may have received from other providers in the past year.     Assessment:   This is a routine wellness examination for [name].  Hearing/Vision screen Hearing Screening - Comments:: Patient denies any hearing difficulties w/hearing aids Vision Screening - Comments:: Pt says their vision is good with glasses Dr  Myrna   Goals Addressed               This Visit's Progress     Increase physical activity (pt-stated)   On track     08/21/24-I will continue to exercise for at least 30 min daily.       COMPLETED: Patient Stated        06/10/2020, I will continue to walk for about 2-3 days a week for 1 hour.       COMPLETED: Track and Manage My Blood Pressure-Hypertension   On track     Timeframe:  Long-Range Goal Priority:  Medium Start Date:       12/13/21                      Expected End Date:   12/13/22                    Follow Up Date Feb 2024   - check blood pressure 3 times per week - choose a place to take my blood pressure (home, clinic or office,  retail store) - write blood pressure results in a log or diary    Why is this important?   You won't feel high blood pressure, but it can still hurt your blood vessels.  High blood pressure can cause heart or kidney problems. It can also cause a stroke.  Making lifestyle changes like losing a little weight or eating less salt will  help.  Checking your blood pressure at home and at different times of the day can help to control blood pressure.  If the doctor prescribes medicine remember to take it the way the doctor ordered.  Call the office if you cannot afford the medicine or if there are questions about it.     Notes:        Depression Screen     08/21/2024    1:42 PM 06/14/2024   12:58 PM 04/29/2024    3:57 PM 04/01/2024    9:42 AM 01/09/2024   12:12 PM 08/28/2023    2:06 PM 08/21/2023    3:03 PM  PHQ 2/9 Scores  PHQ - 2 Score 0 0 0 0 0 0 0  PHQ- 9 Score   1 0 1 2     Fall Risk     08/21/2024    1:37 PM 04/29/2024    3:56 PM 04/01/2024    9:42 AM 01/09/2024   12:12 PM 08/28/2023    2:06 PM  Fall Risk   Falls in the past year? 0 1 1 1  0  Number falls in past yr: 0 0 0 0 0  Injury with Fall? 0 0 1 0 0  Risk for fall due to : No Fall Risks History of fall(s) History of fall(s) History of fall(s) No Fall Risks  Follow up Education provided;Falls prevention discussed Falls evaluation completed  Falls evaluation completed Falls evaluation completed    MEDICARE RISK AT HOME:  Medicare Risk at Home Any stairs in or around the home?: No If so, are there any without handrails?: Yes Home free of loose throw rugs in walkways, pet beds, electrical cords, etc?: Yes Adequate lighting in your home to reduce risk of falls?: Yes Life alert?: No Use of a cane, walker or w/c?: No Grab bars in the bathroom?: Yes Shower chair or bench in shower?: Yes Elevated toilet seat or a handicapped toilet?: Yes  TIMED UP AND GO:  Was the test performed?  No  Cognitive Function: 6CIT completed    06/10/2020    2:02 PM 05/23/2017   10:41 AM 05/20/2016    8:38 AM  MMSE - Mini Mental State Exam  Orientation to time 5 5  5    Orientation to Place 5 5  5    Registration 3 3  3    Attention/ Calculation 5 0  0   Recall 3 3  3    Language- name 2 objects  0  0   Language- repeat 1 1 1   Language- follow 3 step command  3  3    Language- read & follow direction  0  0   Write a sentence  0  0   Copy design  0  0   Total score  20  20      Data saved with a previous flowsheet row definition        08/21/2024    1:47 PM 08/21/2023    3:04 PM 08/16/2022    2:36 PM  6CIT Screen  What Year? 0 points 0 points 0 points  What  month? 0 points 0 points 0 points  What time? 0 points 0 points 0 points  Count back from 20 0 points 0 points 0 points  Months in reverse 0 points 0 points 0 points  Repeat phrase 0 points 0 points 0 points  Total Score 0 points 0 points 0 points    Immunizations Immunization History  Administered Date(s) Administered   Fluad Quad(high Dose 65+) 06/27/2019, 07/01/2021, 08/25/2022   Fluad Trivalent(High Dose 65+) 08/28/2023, 08/15/2024   INFLUENZA, HIGH DOSE SEASONAL PF 08/01/2014, 08/10/2016, 07/18/2017, 07/12/2018, 07/29/2020   Influenza Split 08/10/2012   Influenza,inj,Quad PF,6+ Mos 08/13/2015   Influenza-Unspecified 07/01/2013, 07/01/2014, 07/18/2017   PFIZER(Purple Top)SARS-COV-2 Vaccination 11/21/2019, 12/12/2019, 11/12/2020   Pneumococcal Conjugate-13 05/13/2014   Pneumococcal Polysaccharide-23 02/17/2010   Td 02/11/2009   Zoster, Live 05/07/2010    Screening Tests Health Maintenance  Topic Date Due   Zoster Vaccines- Shingrix  (1 of 2) 05/17/1963   DTaP/Tdap/Td (2 - Tdap) 02/12/2019   DEXA SCAN  03/05/2025   Medicare Annual Wellness (AWV)  08/21/2025   Pneumococcal Vaccine: 50+ Years  Completed   Influenza Vaccine  Completed   Meningococcal B Vaccine  Aged Out   Mammogram  Discontinued   Colonoscopy  Discontinued   COVID-19 Vaccine  Discontinued   Hepatitis C Screening  Discontinued    Health Maintenance Items Addressed: None at this time  Additional Screening:  Vision Screening: Recommended annual ophthalmology exams for early detection of glaucoma and other disorders of the eye. Is the patient up to date with their annual eye exam?  Yes  Who is the  provider or what is the name of the office in which the patient attends annual eye exams? Dr Myrna (last) at [facility]  Dental Screening: Recommended annual dental exams for proper oral hygiene  Community Resource Referral / Chronic Care Management: CRR required this visit?  No   CCM required this visit?  No   Plan:    I have personally reviewed and noted the following in the patient's chart:   Medical and social history Use of alcohol, tobacco or illicit drugs  Current medications and supplements including opioid prescriptions. Patient is not currently taking opioid prescriptions. Functional ability and status Nutritional status Physical activity Advanced directives List of other physicians Hospitalizations, surgeries, and ER visits in previous 12 months Vitals Screenings to include cognitive, depression, and falls Referrals and appointments  In addition, I have reviewed and discussed with patient certain preventive protocols, quality metrics, and best practice recommendations. A written personalized care plan for preventive services as well as general preventive health recommendations were provided to patient.   Erminio LITTIE Saris, LPN   [id number]   After Visit Summary: (MyChart) Due to this being a telephonic visit, the after visit summary with patients personalized plan was offered to patient via MyChart   Notes: Pt relays she is going to PT 1-2 times weekly (as you/pt) previously discussed.

## 2024-08-22 DIAGNOSIS — M542 Cervicalgia: Secondary | ICD-10-CM | POA: Diagnosis not present

## 2024-08-25 ENCOUNTER — Other Ambulatory Visit: Payer: Self-pay | Admitting: Family Medicine

## 2024-08-25 DIAGNOSIS — E7800 Pure hypercholesterolemia, unspecified: Secondary | ICD-10-CM

## 2024-08-25 DIAGNOSIS — Z862 Personal history of diseases of the blood and blood-forming organs and certain disorders involving the immune mechanism: Secondary | ICD-10-CM

## 2024-08-25 DIAGNOSIS — E559 Vitamin D deficiency, unspecified: Secondary | ICD-10-CM

## 2024-08-25 DIAGNOSIS — M8580 Other specified disorders of bone density and structure, unspecified site: Secondary | ICD-10-CM

## 2024-08-27 ENCOUNTER — Other Ambulatory Visit (INDEPENDENT_AMBULATORY_CARE_PROVIDER_SITE_OTHER)

## 2024-08-27 DIAGNOSIS — Z862 Personal history of diseases of the blood and blood-forming organs and certain disorders involving the immune mechanism: Secondary | ICD-10-CM

## 2024-08-27 DIAGNOSIS — M8580 Other specified disorders of bone density and structure, unspecified site: Secondary | ICD-10-CM | POA: Diagnosis not present

## 2024-08-27 DIAGNOSIS — E7800 Pure hypercholesterolemia, unspecified: Secondary | ICD-10-CM | POA: Diagnosis not present

## 2024-08-27 DIAGNOSIS — E559 Vitamin D deficiency, unspecified: Secondary | ICD-10-CM | POA: Diagnosis not present

## 2024-08-27 DIAGNOSIS — M542 Cervicalgia: Secondary | ICD-10-CM | POA: Diagnosis not present

## 2024-08-27 LAB — LIPID PANEL
Cholesterol: 143 mg/dL (ref 0–200)
HDL: 44.2 mg/dL (ref >39.00)
LDL Cholesterol: 71 mg/dL (ref 0–99)
NonHDL: 98.82
Total CHOL/HDL Ratio: 3
Triglycerides: 138 mg/dL (ref 0.0–149.0)
VLDL: 27.6 mg/dL (ref 0.0–40.0)

## 2024-08-27 LAB — VITAMIN D 25 HYDROXY (VIT D DEFICIENCY, FRACTURES): VITD: 48.4 ng/mL (ref 30.00–100.00)

## 2024-08-27 LAB — VITAMIN B12: Vitamin B-12: 324 pg/mL (ref 211–911)

## 2024-08-28 ENCOUNTER — Ambulatory Visit: Payer: Self-pay | Admitting: Family Medicine

## 2024-09-03 ENCOUNTER — Ambulatory Visit (INDEPENDENT_AMBULATORY_CARE_PROVIDER_SITE_OTHER): Admitting: Family Medicine

## 2024-09-03 ENCOUNTER — Encounter: Payer: Self-pay | Admitting: Family Medicine

## 2024-09-03 VITALS — BP 122/67 | HR 76 | Temp 97.9°F | Ht 64.0 in | Wt 126.0 lb

## 2024-09-03 DIAGNOSIS — E7800 Pure hypercholesterolemia, unspecified: Secondary | ICD-10-CM

## 2024-09-03 DIAGNOSIS — R350 Frequency of micturition: Secondary | ICD-10-CM | POA: Diagnosis not present

## 2024-09-03 DIAGNOSIS — M542 Cervicalgia: Secondary | ICD-10-CM | POA: Diagnosis not present

## 2024-09-03 DIAGNOSIS — Z0000 Encounter for general adult medical examination without abnormal findings: Secondary | ICD-10-CM | POA: Diagnosis not present

## 2024-09-03 DIAGNOSIS — E039 Hypothyroidism, unspecified: Secondary | ICD-10-CM

## 2024-09-03 DIAGNOSIS — M8580 Other specified disorders of bone density and structure, unspecified site: Secondary | ICD-10-CM | POA: Diagnosis not present

## 2024-09-03 DIAGNOSIS — B079 Viral wart, unspecified: Secondary | ICD-10-CM

## 2024-09-03 DIAGNOSIS — R519 Headache, unspecified: Secondary | ICD-10-CM | POA: Diagnosis not present

## 2024-09-03 DIAGNOSIS — Z7189 Other specified counseling: Secondary | ICD-10-CM

## 2024-09-03 NOTE — Patient Instructions (Addendum)
 Go to the lab on the way out.   If you have mychart we'll likely use that to update you.    Take care.  Glad to see you.  Let me know if you need help continuing PT.    Let me know when you want to get set up for a bone density and mammogram in ~02/2025.

## 2024-09-03 NOTE — Progress Notes (Unsigned)
 Vaccines d/w pt. Flu 2025 RSV d/w pt.  Mammogram 2025 DXA 2024 Advance directive- d/w pt.  Has a living will.  Would have Therisa Keepers, then her niece Mercy Caldron designated in that order should patient be incapacitated.   Colonoscopy 2024  2 warts on the R 4th finger.  D/w pt about freezing.    Hypothyroidism. TSH wnl recently.  Compliant.    Elevated Cholesterol: Using medications without problems: yes Muscle aches: not likely from statin.  Diet compliance: yes Exercise: yes, [employer], walking.   Recent labs d/w pt.    Osteopenia.  Vit D wnl.  D/w pt about recheck DXA 2026.  D/w pt about defer prolia at this point.    BP 122/67 at home.    She has neuro f/u pending re: HA.  She has some improvement with PT, but still having some HA.  She is trying to limit tylenol .  She is working on [employer].  She has f/u pending today with PT.    No pain with urination but some urgency and frequency.    Meds, vitals, and allergies reviewed.   ROS: Per HPI unless specifically indicated in ROS section   GEN: nad, alert and oriented HEENT: mucous membranes moist NECK: supple w/o LA CV: rrr PULM: ctab, no inc wob ABD: soft, +bs EXT: no edema SKIN:  well perfused.

## 2024-09-04 ENCOUNTER — Ambulatory Visit: Payer: Self-pay | Admitting: Family Medicine

## 2024-09-04 LAB — URINALYSIS, ROUTINE W REFLEX MICROSCOPIC
Bilirubin Urine: NEGATIVE
Hgb urine dipstick: NEGATIVE
Ketones, ur: NEGATIVE
Leukocytes,Ua: NEGATIVE
Nitrite: NEGATIVE
RBC / HPF: NONE SEEN (ref 0–?)
Specific Gravity, Urine: 1.01 (ref 1.000–1.030)
Total Protein, Urine: NEGATIVE
Urine Glucose: NEGATIVE
Urobilinogen, UA: 0.2 (ref 0.0–1.0)
WBC, UA: NONE SEEN (ref 0–?)
pH: 6.5 (ref 5.0–8.0)

## 2024-09-04 LAB — URINE CULTURE
MICRO NUMBER:: 17188574
Result:: NO GROWTH
SPECIMEN QUALITY:: ADEQUATE

## 2024-09-04 NOTE — Assessment & Plan Note (Signed)
 Vit D wnl.  D/w pt about recheck DXA 2026.  D/w pt about defer prolia at this point.

## 2024-09-04 NOTE — Assessment & Plan Note (Signed)
Advance directive- d/w pt.  Has a living will.  Would have [name], then her niece [name] designated in that order should patient be incapacitated.

## 2024-09-04 NOTE — Assessment & Plan Note (Signed)
 TSH normal on most recent check.  Continue levothyroxine  as is.

## 2024-09-04 NOTE — Assessment & Plan Note (Signed)
Continue work on diet and exercise.  Continue pravastatin.  Labs discussed with patient.

## 2024-09-04 NOTE — Assessment & Plan Note (Signed)
 Vaccines d/w pt. Flu 2025 RSV d/w pt.  Mammogram 2025 DXA 2024 Advance directive- d/w pt.  Has a living will.  Would have Therisa Keepers, then her niece Mercy Caldron designated in that order should patient be incapacitated.   Colonoscopy 2024

## 2024-09-04 NOTE — Assessment & Plan Note (Signed)
 See notes on labs.

## 2024-09-04 NOTE — Assessment & Plan Note (Signed)
 Both treated x 3 with liquid nitrogen with adequate freeze thaw cycle after getting consent from patient.  Tolerated well.  No complication.  Routine cautions given to patient.  She can update me as needed.  We can refreeze in the future if needed.

## 2024-09-04 NOTE — Assessment & Plan Note (Signed)
 I do not suspect an ominous process given her previous negative imaging.  Some improvement with PT.  Would continue as is.

## 2024-09-05 DIAGNOSIS — M542 Cervicalgia: Secondary | ICD-10-CM | POA: Diagnosis not present

## 2024-09-10 DIAGNOSIS — M549 Dorsalgia, unspecified: Secondary | ICD-10-CM | POA: Diagnosis not present

## 2024-09-10 DIAGNOSIS — R519 Headache, unspecified: Secondary | ICD-10-CM | POA: Diagnosis not present

## 2024-09-10 DIAGNOSIS — M542 Cervicalgia: Secondary | ICD-10-CM | POA: Diagnosis not present

## 2024-09-10 DIAGNOSIS — G514 Facial myokymia: Secondary | ICD-10-CM | POA: Diagnosis not present

## 2024-09-10 DIAGNOSIS — G5133 Clonic hemifacial spasm, bilateral: Secondary | ICD-10-CM | POA: Diagnosis not present

## 2024-09-10 DIAGNOSIS — G8929 Other chronic pain: Secondary | ICD-10-CM | POA: Diagnosis not present

## 2024-09-12 DIAGNOSIS — M542 Cervicalgia: Secondary | ICD-10-CM | POA: Diagnosis not present

## 2024-10-01 DIAGNOSIS — M542 Cervicalgia: Secondary | ICD-10-CM | POA: Diagnosis not present

## 2024-10-03 DIAGNOSIS — Z961 Presence of intraocular lens: Secondary | ICD-10-CM | POA: Diagnosis not present

## 2024-10-03 DIAGNOSIS — H43813 Vitreous degeneration, bilateral: Secondary | ICD-10-CM | POA: Diagnosis not present

## 2024-10-03 DIAGNOSIS — H40003 Preglaucoma, unspecified, bilateral: Secondary | ICD-10-CM | POA: Diagnosis not present

## 2024-10-03 DIAGNOSIS — H04123 Dry eye syndrome of bilateral lacrimal glands: Secondary | ICD-10-CM | POA: Diagnosis not present

## 2024-11-25 ENCOUNTER — Ambulatory Visit: Admitting: Physician Assistant

## 2024-11-28 ENCOUNTER — Encounter: Payer: Self-pay | Admitting: Cardiology

## 2024-11-28 ENCOUNTER — Ambulatory Visit: Admitting: Cardiology

## 2024-11-28 VITALS — BP 140/86 | HR 72 | Ht 64.0 in | Wt 127.6 lb

## 2024-11-28 DIAGNOSIS — E7800 Pure hypercholesterolemia, unspecified: Secondary | ICD-10-CM

## 2024-11-28 DIAGNOSIS — I2510 Atherosclerotic heart disease of native coronary artery without angina pectoris: Secondary | ICD-10-CM | POA: Diagnosis not present

## 2024-11-28 NOTE — Progress Notes (Signed)
 " Cardiology Office Note:    Date:  11/28/2024   ID:  [name], DOB 1946/04/04, MRN [id number]  PCP:  Cleatus Arlyss RAMAN, MD  Cardiologist:  Redell Cave, MD  Electrophysiologist:  None   Referring MD: Cleatus Arlyss RAMAN, MD   Chief Complaint  Patient presents with   Follow-up    12 month f/u. Meds reviewed verbally with pt.    History of Present Illness:    [name] is a 79 y.o. female with a hx of hyperlipidemia, coronary calcifications, pulmonary nodule, who presents for follow-up.   Doing okay, denies chest pain or shortness of breath.  Has occasional back pain due to a tight muscle.  Compliant with medications as prescribed, recent cholesterol blood work showed normal levels.  Tolerating Pravachol  with no adverse effects.  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   Prior notes chest CT 04/2020 showed aortic atherosclerosis and also calcifications in the LAD.   Echocardiogram 12/2019 showed normal systolic and diastolic function, EF 60 to 65%. Lexiscan  Myoview  12/2019, normal, no evidence for ischemia. History of statin intolerance/myalgias with simvastatin, Lipitor.  Able to tolerate Pravachol .   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  Could be due to hernia or old scar tissue.  prev with CT done w/o alarming findings.  Will treat episodically.  Use tylenol  #3 prn.     Headache    Hyperlipidemia    Lipoma of thigh    Left   Lung nodule    Normal cardiac stress test 2014   PONV (postoperative nausea and vomiting)    nausea after hysterectomy, and 2 days after lung surgery 10/2015   Thyroid  cancer (HCC) 12/2015   Thyroid  nodule     Past Surgical History:  Procedure Laterality Date   ABDOMINAL HYSTERECTOMY     BIOPSY  04/17/2023   Procedure:  BIOPSY;  Surgeon: Unk Corinn Skiff, MD;  Location: [facility];  Service: Gastroenterology;;   BREAST EXCISIONAL BIOPSY Right 1977   BREAST MASS EXCISION  1977   benign   BREAST SURGERY     CARPAL TUNNEL RELEASE Right 1978   CATARACT EXTRACTION, BILATERAL Bilateral    CHOLECYSTECTOMY  11/23/2016   Procedure: LAPAROSCOPIC CHOLECYSTECTOMY;  Surgeon: Carlin Pastel, MD;  Location: [facility];  Service: General;;   COLONOSCOPY  2009   COLONOSCOPY WITH PROPOFOL  N/A 05/18/2019   Procedure: COLONOSCOPY WITH PROPOFOL ;  Surgeon: Janalyn Keene NOVAK, MD;  Location: [facility];  Service: Endoscopy;  Laterality: N/A;   COLONOSCOPY WITH PROPOFOL  N/A 08/15/2019   Procedure: COLONOSCOPY WITH PROPOFOL ;  Surgeon: Janalyn Keene NOVAK, MD;  Location: [facility];  Service: Endoscopy;  Laterality: N/A;   COLONOSCOPY WITH PROPOFOL  N/A 11/04/2022   Procedure: COLONOSCOPY WITH PROPOFOL ;  Surgeon: Unk Corinn Skiff, MD;  Location: [facility] ENDOSCOPY;  Service: Gastroenterology;  Laterality: N/A;   DILATION AND CURETTAGE OF UTERUS  2001   ELECTROMAGNETIC NAVIGATION BROCHOSCOPY N/A 08/25/2015   Procedure: ELECTROMAGNETIC NAVIGATION BRONCHOSCOPY;  Surgeon: Nickolas Cellar, MD;  Location: [facility];  Service: Cardiopulmonary;  Laterality: N/A;   ENDOBRONCHIAL ULTRASOUND N/A 08/25/2015   Procedure: ENDOBRONCHIAL ULTRASOUND;  Surgeon: Nickolas Cellar, MD;  Location: [facility];  Service: Cardiopulmonary;  Laterality: N/A;   ESOPHAGOGASTRODUODENOSCOPY  2002, 11/15/07   Normal (Dr. Raynette)   ESOPHAGOGASTRODUODENOSCOPY (EGD) WITH  PROPOFOL  N/A 05/18/2019   Procedure: ESOPHAGOGASTRODUODENOSCOPY (EGD) WITH PROPOFOL ;  Surgeon: Janalyn Keene NOVAK, MD;  Location: [facility];  Service: Endoscopy;  Laterality: N/A;   ESOPHAGOGASTRODUODENOSCOPY (EGD) WITH PROPOFOL  N/A 08/15/2019   Procedure: ESOPHAGOGASTRODUODENOSCOPY (EGD) WITH PROPOFOL ;  Surgeon: Janalyn Keene NOVAK, MD;  Location: [facility];  Service: Endoscopy;   Laterality: N/A;   ESOPHAGOGASTRODUODENOSCOPY (EGD) WITH PROPOFOL  N/A 04/17/2023   Procedure: ESOPHAGOGASTRODUODENOSCOPY (EGD) WITH PROPOFOL ;  Surgeon: Unk Corinn Skiff, MD;  Location: [facility] ENDOSCOPY;  Service: Gastroenterology;  Laterality: N/A;   EYE SURGERY     FACIAL COSMETIC SURGERY  01/13/2010   mini facelift   HERNIA REPAIR  1976   INSERTION OF MESH  07/06/2022   Procedure: INSERTION OF MESH;  Surgeon: Rodolph Romano, MD;  Location: [facility];  Service: General;;   OOPHORECTOMY     PTOSIS REPAIR  12/08/2021   THORACOTOMY/LOBECTOMY Right 10/27/2015   Procedure: THORACOTOMY/LOBECTOMY;  Surgeon: Evalene Glasser, MD;  Location: [facility];  Service: Thoracic;  Laterality: Right;   THYROIDECTOMY N/A 12/29/2015   Procedure: THYROIDECTOMY;  Surgeon: Chinita Hasten, MD;  Location: [facility];  Service: ENT;  Laterality: N/A;   TOTAL VAGINAL HYSTERECTOMY  12/30/2008   for fibroid pain (Dr. Evangeline)   XI ROBOTIC ASSISTED SMALL BOWEL RESECTION N/A 06/28/2022   Procedure: XI ROBOTIC ASSISTED SMALL BOWEL RESECTION;  Surgeon: Rodolph Romano, MD;  Location: [facility];  Service: General;  Laterality: N/A;    Current Medications: Current Meds  Medication Sig   aspirin  EC 81 MG tablet Take 1 tablet (81 mg total) by mouth daily.   Baclofen 5 MG TABS Take 10 mg by mouth at bedtime. Take 5 mg nightly for 1 week, then increase to 5 mg twice daily and continue   Biotin  1000 MCG tablet Take 1,000 mcg by mouth in the morning and at bedtime.   cholecalciferol (VITAMIN D3) 25 MCG (1000 UNIT) tablet Take 1,000 Units by mouth daily.   Coenzyme Q10 200 MG capsule Take 100 mg by mouth daily.   gabapentin  (NEURONTIN ) 100 MG capsule Take 2 capsules (200 mg total) by mouth daily.   levothyroxine  (SYNTHROID ) 100 MCG tablet 1 pill a day except for 0.5 pills on Sundays. 6.5 tabs per week.   multivitamin-lutein  (OCUVITE-LUTEIN ) CAPS capsule Take 1 capsule by mouth daily.   Omega-3 Fatty Acids (FISH OIL) 1000  MG CAPS Take 1 capsule by mouth daily.   pantoprazole  (PROTONIX ) 40 MG tablet Take 1 tablet (40 mg total) by mouth 2 (two) times daily before a meal.   Potassium Gluconate 595 MG CAPS Take 595 mg by mouth daily.    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  He has short term memory loss.     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   Previously had an adopted son who died in 12-15-19.   Social Drivers of Health   Tobacco Use: Low Risk (11/28/2024)   Patient History    Smoking Tobacco Use: Never    Smokeless Tobacco Use: Never    Passive Exposure: Not on file  Financial Resource Strain: Low Risk (08/21/2024)   Overall Financial Resource Strain (CARDIA)    Difficulty of Paying Living Expenses: Not hard at all  Food Insecurity: No Food Insecurity (08/21/2024)   Epic    Worried About Radiation Protection Practitioner of Food in the Last Year: Never true    Ran Out of Food in the Last Year: Never true  Transportation Needs: No Transportation Needs (08/21/2024)   Epic    Lack of Transportation (Medical): No    Lack of Transportation (Non-Medical): No  Physical Activity: Sufficiently Active (08/21/2024)    Exercise Vital Sign    Days of Exercise per Week: 5 days    Minutes of Exercise per Session: 30 min  Stress: No Stress Concern Present (08/21/2024)   [employer] of Occupational Health - Occupational Stress Questionnaire    Feeling of Stress: Only a little  Social Connections: Socially Integrated (08/21/2024)   Social Connection and Isolation Panel    Frequency of Communication with Friends and Family: More than three times a week    Frequency of Social Gatherings with Friends and Family: More than three times a week    Attends Religious Services: More than 4 times per year    Active Member of Clubs or Organizations: Yes    Attends Banker Meetings: More than 4 times per year    Marital Status: Married  Depression (PHQ2-9): Low Risk (09/03/2024)   Depression (PHQ2-9)    PHQ-2 Score: 2  Alcohol Screen: Low Risk (08/21/2024)   Alcohol Screen    Last Alcohol Screening Score (AUDIT): 0  Housing: Unknown (08/21/2024)   Epic    Unable to Pay for Housing in the Last Year: No    Number of Times Moved in the Last Year: Not on file    Homeless in the Last Year: No  Utilities: Not At Risk (08/21/2024)   Epic    Threatened with loss of utilities: No  Health Literacy: Adequate Health Literacy (08/21/2024)   B1300 Health Literacy    Frequency of need for help with medical instructions: Never     Family History: The patient's family history includes Cancer in her sister; Hypertension in her mother, sister, sister, sister, and sister; Stroke in her mother; Transient ischemic attack in her mother. There is no history of Breast cancer or Colon cancer.  ROS:   Please see the history of present illness.     All other systems reviewed and are negative.  EKGs/Labs/Other Studies Reviewed:    The following studies were reviewed today:  EKG Interpretation Date/Time:  Thursday November 28 2024 10:22:33 EST Ventricular Rate:  72 PR Interval:  162 QRS Duration:  74 QT  Interval:  386 QTC Calculation: 422 R Axis:   -12  Text Interpretation: Normal sinus rhythm Minimal voltage criteria for LVH, may be normal variant Confirmed by Darliss Rogue ([id number]) on 11/28/2024 10:56:25 AM    Recent Labs: 05/24/2024: ALT 22; BUN 16; Creatinine 0.71; Hemoglobin 12.7; Platelet Count 149; Potassium 4.4; Sodium 137;  TSH 1.960  Recent Lipid Panel    Component Value Date/Time   CHOL 143 08/27/2024 0856   CHOL 145 07/01/2013 0103   TRIG 138.0 08/27/2024 0856   TRIG 224 (H) 07/01/2013 0103   HDL 44.20 08/27/2024 0856   HDL 29 (L) 07/01/2013 0103   CHOLHDL 3 08/27/2024 0856   VLDL 27.6 08/27/2024 0856   VLDL 45 (H) 07/01/2013 0103   LDLCALC 71 08/27/2024 0856   LDLCALC 71 07/01/2013 0103   LDLDIRECT 83.8 03/11/2011 0823    Physical Exam:    VS:  BP (!) 140/86 (BP Location: Left Arm, Patient Position: Sitting, Cuff Size: Normal)   Pulse 72   Ht 5' 4 (1.626 m)   Wt 127 lb 9.6 oz (57.9 kg)   SpO2 97%   BMI 21.90 kg/m     Wt Readings from Last 3 Encounters:  11/28/24 127 lb 9.6 oz (57.9 kg)  09/03/24 126 lb (57.2 kg)  08/21/24 124 lb (56.2 kg)     GEN:  Well nourished, well developed in no acute distress HEENT: Normal NECK: No JVD; No carotid bruits CARDIAC: RRR, no murmurs, rubs, gallops RESPIRATORY:  Clear to auscultation without rales, wheezing or rhonchi  ABDOMEN: Soft, non-tender, non-distended MUSCULOSKELETAL:  No edema; No deformity  SKIN: Warm and dry NEUROLOGIC:  Alert and oriented x 3 PSYCHIATRIC:  Normal affect   ASSESSMENT:    1. Coronary artery calcification   2. Pure hypercholesterolemia    PLAN:    In order of problems listed above:  Coronary calcifications, coronary CT 7/23 showed calcium  score 19.1, minimal proximal LAD stenosis < 25%.  Denies chest pain.  Continue aspirin  81 mg daily, pravastatin  40 mg daily.  Hyperlipidemia, cholesterol controlled.  Continue pravastatin  40 mg daily.  Did not tolerate Lipitor and simvastatin  in the past  Follow-up yearly   Medication Adjustments/Labs and Tests Ordered: Current medicines are reviewed at length with the patient today.  Concerns regarding medicines are outlined above.  Orders Placed This Encounter  Procedures   EKG 12-Lead   No orders of the defined types were placed in this encounter.   Patient Instructions  Medication Instructions:  Your physician recommends that you continue on your current medications as directed. Please refer to the Current Medication list given to you today.  *If you need a refill on your cardiac medications before your next appointment, please call your pharmacy*  Lab Work: No labs ordered today  If you have labs (blood work) drawn today and your tests are completely normal, you will receive your results only by: MyChart Message (if you have MyChart) OR A paper copy in the mail If you have any lab test that is abnormal or we need to change your treatment, we will call you to review the results.  Testing/Procedures: No test ordered today   Follow-Up: At [facility], you and your health needs are our priority.  As part of our continuing mission to provide you with exceptional heart care, our providers are all part of one team.  This team includes your primary Cardiologist (physician) and Advanced Practice Providers or APPs (Physician Assistants and Nurse Practitioners) who all work together to provide you with the care you need, when you need it.  Your next appointment:   1 year(s)  Provider:   You may see Redell Cave, MD or one of the following Advanced Practice Providers on your designated Care Team:   Lonni Meager, NP Lesley Maffucci, PA-C Bernardino Bring, PA-C Cadence  [place], PA-C Tylene Lunch, NP Barnie Hila, NP    We recommend signing up for the patient portal called MyChart.  Sign up information is provided on this After Visit Summary.  MyChart is used to connect with patients for Virtual Visits  (Telemedicine).  Patients are able to view lab/test results, encounter notes, upcoming appointments, etc.  Non-urgent messages can be sent to your provider as well.   To learn more about what you can do with MyChart, go to [email or web address].   Other Instructions            Signed, Redell Cave, MD  11/28/2024 11:59 AM    [facility] "

## 2024-11-28 NOTE — Patient Instructions (Signed)
 Medication Instructions:  Your physician recommends that you continue on your current medications as directed. Please refer to the Current Medication list given to you today.  *If you need a refill on your cardiac medications before your next appointment, please call your pharmacy*  Lab Work: No labs ordered today  If you have labs (blood work) drawn today and your tests are completely normal, you will receive your results only by: MyChart Message (if you have MyChart) OR A paper copy in the mail If you have any lab test that is abnormal or we need to change your treatment, we will call you to review the results.  Testing/Procedures: No test ordered today   Follow-Up: At [facility], you and your health needs are our priority.  As part of our continuing mission to provide you with exceptional heart care, our providers are all part of one team.  This team includes your primary Cardiologist (physician) and Advanced Practice Providers or APPs (Physician Assistants and Nurse Practitioners) who all work together to provide you with the care you need, when you need it.  Your next appointment:   1 year(s)  Provider:   You may see Redell Cave, MD or one of the following Advanced Practice Providers on your designated Care Team:   Lonni Meager, NP Lesley Maffucci, PA-C Bernardino Bring, PA-C Cadence [place], PA-C Tylene Lunch, NP Barnie Hila, NP    We recommend signing up for the patient portal called MyChart.  Sign up information is provided on this After Visit Summary.  MyChart is used to connect with patients for Virtual Visits (Telemedicine).  Patients are able to view lab/test results, encounter notes, upcoming appointments, etc.  Non-urgent messages can be sent to your provider as well.   To learn more about what you can do with MyChart, go to [email or web address].   Other Instructions

## 2024-11-29 ENCOUNTER — Ambulatory Visit: Admitting: Physician Assistant

## 2024-12-16 ENCOUNTER — Ambulatory Visit

## 2024-12-16 ENCOUNTER — Other Ambulatory Visit

## 2025-01-06 ENCOUNTER — Ambulatory Visit: Admitting: Internal Medicine

## 2025-08-22 ENCOUNTER — Ambulatory Visit
